# Patient Record
Sex: Female | Born: 1940 | Race: Black or African American | Hispanic: No | Smoking: Former smoker
Health system: Southern US, Community
[De-identification: ages and names within clinical notes are randomized; demographics above are authoritative.]

## PROBLEM LIST (undated history)

## (undated) DIAGNOSIS — M8580 Other specified disorders of bone density and structure, unspecified site: Secondary | ICD-10-CM

## (undated) DIAGNOSIS — I10 Essential (primary) hypertension: Secondary | ICD-10-CM

## (undated) DIAGNOSIS — J45909 Unspecified asthma, uncomplicated: Secondary | ICD-10-CM

## (undated) DIAGNOSIS — I2510 Atherosclerotic heart disease of native coronary artery without angina pectoris: Secondary | ICD-10-CM

## (undated) DIAGNOSIS — F319 Bipolar disorder, unspecified: Secondary | ICD-10-CM

## (undated) DIAGNOSIS — J310 Chronic rhinitis: Secondary | ICD-10-CM

## (undated) DIAGNOSIS — K635 Polyp of colon: Secondary | ICD-10-CM

## (undated) DIAGNOSIS — K449 Diaphragmatic hernia without obstruction or gangrene: Secondary | ICD-10-CM

## (undated) DIAGNOSIS — J449 Chronic obstructive pulmonary disease, unspecified: Secondary | ICD-10-CM

## (undated) DIAGNOSIS — K219 Gastro-esophageal reflux disease without esophagitis: Secondary | ICD-10-CM

## (undated) DIAGNOSIS — J4530 Mild persistent asthma, uncomplicated: Secondary | ICD-10-CM

## (undated) DIAGNOSIS — E785 Hyperlipidemia, unspecified: Secondary | ICD-10-CM

## (undated) DIAGNOSIS — B9681 Helicobacter pylori [H. pylori] as the cause of diseases classified elsewhere: Secondary | ICD-10-CM

## (undated) DIAGNOSIS — K3184 Gastroparesis: Secondary | ICD-10-CM

## (undated) DIAGNOSIS — M545 Low back pain, unspecified: Secondary | ICD-10-CM

## (undated) DIAGNOSIS — J329 Chronic sinusitis, unspecified: Secondary | ICD-10-CM

## (undated) DIAGNOSIS — K2970 Gastritis, unspecified, without bleeding: Secondary | ICD-10-CM

## (undated) DIAGNOSIS — F419 Anxiety disorder, unspecified: Secondary | ICD-10-CM

## (undated) DIAGNOSIS — G4730 Sleep apnea, unspecified: Secondary | ICD-10-CM

## (undated) HISTORY — DX: Chronic sinusitis, unspecified: J32.9

## (undated) HISTORY — DX: Other specified disorders of bone density and structure, unspecified site: M85.80

## (undated) HISTORY — DX: Atherosclerotic heart disease of native coronary artery without angina pectoris: I25.10

## (undated) HISTORY — DX: Chronic rhinitis: J31.0

## (undated) HISTORY — DX: Hyperlipidemia, unspecified: E78.5

## (undated) HISTORY — DX: Polyp of colon: K63.5

## (undated) HISTORY — DX: Bipolar disorder, unspecified: F31.9

## (undated) HISTORY — DX: Low back pain, unspecified: M54.50

## (undated) HISTORY — DX: Diaphragmatic hernia without obstruction or gangrene: K44.9

## (undated) HISTORY — PX: POLYPECTOMY: SHX149

## (undated) HISTORY — DX: Gastritis, unspecified, without bleeding: K29.70

## (undated) HISTORY — DX: Helicobacter pylori (H. pylori) as the cause of diseases classified elsewhere: B96.81

## (undated) HISTORY — DX: Mild persistent asthma, uncomplicated: J45.30

## (undated) HISTORY — DX: Sleep apnea, unspecified: G47.30

## (undated) HISTORY — PX: COLONOSCOPY: SHX174

## (undated) HISTORY — DX: Gastro-esophageal reflux disease without esophagitis: K21.9

## (undated) HISTORY — DX: Gastroparesis: K31.84

---
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
## 1966-11-11 HISTORY — PX: TUBAL LIGATION: SHX77

## 1967-11-12 HISTORY — PX: OTHER SURGICAL HISTORY: SHX169

## 1998-03-06 ENCOUNTER — Ambulatory Visit (HOSPITAL_COMMUNITY): Admission: RE | Admit: 1998-03-06 | Discharge: 1998-03-06 | Payer: Self-pay

## 1998-05-30 ENCOUNTER — Other Ambulatory Visit: Admission: RE | Admit: 1998-05-30 | Discharge: 1998-05-30 | Payer: Self-pay | Admitting: Family Medicine

## 1998-09-17 ENCOUNTER — Encounter: Payer: Self-pay | Admitting: Emergency Medicine

## 1998-09-17 ENCOUNTER — Emergency Department (HOSPITAL_COMMUNITY): Admission: EM | Admit: 1998-09-17 | Discharge: 1998-09-17 | Payer: Self-pay | Admitting: Emergency Medicine

## 1998-10-09 ENCOUNTER — Encounter: Payer: Self-pay | Admitting: Emergency Medicine

## 1998-10-09 ENCOUNTER — Emergency Department (HOSPITAL_COMMUNITY): Admission: EM | Admit: 1998-10-09 | Discharge: 1998-10-09 | Payer: Self-pay | Admitting: Emergency Medicine

## 1998-10-22 ENCOUNTER — Emergency Department (HOSPITAL_COMMUNITY): Admission: EM | Admit: 1998-10-22 | Discharge: 1998-10-22 | Payer: Self-pay | Admitting: Emergency Medicine

## 1998-10-22 ENCOUNTER — Encounter: Payer: Self-pay | Admitting: Emergency Medicine

## 1999-01-24 ENCOUNTER — Encounter: Payer: Self-pay | Admitting: *Deleted

## 1999-01-24 ENCOUNTER — Emergency Department (HOSPITAL_COMMUNITY): Admission: EM | Admit: 1999-01-24 | Discharge: 1999-01-24 | Payer: Self-pay | Admitting: *Deleted

## 1999-08-08 ENCOUNTER — Encounter: Payer: Self-pay | Admitting: Internal Medicine

## 1999-08-08 ENCOUNTER — Ambulatory Visit (HOSPITAL_COMMUNITY): Admission: RE | Admit: 1999-08-08 | Discharge: 1999-08-08 | Payer: Self-pay | Admitting: Internal Medicine

## 1999-09-11 ENCOUNTER — Encounter: Admission: RE | Admit: 1999-09-11 | Discharge: 1999-11-20 | Payer: Self-pay | Admitting: Internal Medicine

## 2000-01-05 ENCOUNTER — Encounter: Payer: Self-pay | Admitting: Emergency Medicine

## 2000-01-05 ENCOUNTER — Encounter: Payer: Self-pay | Admitting: Internal Medicine

## 2000-01-05 ENCOUNTER — Inpatient Hospital Stay (HOSPITAL_COMMUNITY): Admission: EM | Admit: 2000-01-05 | Discharge: 2000-01-08 | Payer: Self-pay | Admitting: Emergency Medicine

## 2000-01-06 ENCOUNTER — Encounter: Payer: Self-pay | Admitting: Internal Medicine

## 2000-01-07 ENCOUNTER — Encounter: Payer: Self-pay | Admitting: Neurology

## 2000-01-08 ENCOUNTER — Encounter: Payer: Self-pay | Admitting: Internal Medicine

## 2000-04-10 ENCOUNTER — Ambulatory Visit (HOSPITAL_COMMUNITY): Admission: RE | Admit: 2000-04-10 | Discharge: 2000-04-10 | Payer: Self-pay | Admitting: Internal Medicine

## 2000-04-10 ENCOUNTER — Encounter: Payer: Self-pay | Admitting: Internal Medicine

## 2000-05-17 ENCOUNTER — Encounter: Payer: Self-pay | Admitting: Emergency Medicine

## 2000-05-17 ENCOUNTER — Emergency Department (HOSPITAL_COMMUNITY): Admission: EM | Admit: 2000-05-17 | Discharge: 2000-05-17 | Payer: Self-pay | Admitting: Emergency Medicine

## 2000-07-17 ENCOUNTER — Ambulatory Visit (HOSPITAL_COMMUNITY): Admission: RE | Admit: 2000-07-17 | Discharge: 2000-07-17 | Payer: Self-pay | Admitting: Critical Care Medicine

## 2000-07-17 ENCOUNTER — Encounter: Payer: Self-pay | Admitting: Critical Care Medicine

## 2000-08-21 ENCOUNTER — Encounter: Payer: Self-pay | Admitting: Pulmonary Disease

## 2000-08-21 ENCOUNTER — Ambulatory Visit (HOSPITAL_COMMUNITY): Admission: RE | Admit: 2000-08-21 | Discharge: 2000-08-21 | Payer: Self-pay | Admitting: Pulmonary Disease

## 2000-08-25 ENCOUNTER — Encounter: Admission: RE | Admit: 2000-08-25 | Discharge: 2000-11-23 | Payer: Self-pay

## 2000-09-26 ENCOUNTER — Encounter: Admission: RE | Admit: 2000-09-26 | Discharge: 2000-11-18 | Payer: Self-pay | Admitting: Internal Medicine

## 2001-02-04 ENCOUNTER — Encounter: Admission: RE | Admit: 2001-02-04 | Discharge: 2001-05-05 | Payer: Self-pay | Admitting: Internal Medicine

## 2001-03-04 ENCOUNTER — Inpatient Hospital Stay (HOSPITAL_COMMUNITY): Admission: EM | Admit: 2001-03-04 | Discharge: 2001-03-07 | Payer: Self-pay | Admitting: Internal Medicine

## 2001-04-07 ENCOUNTER — Encounter: Payer: Self-pay | Admitting: Gastroenterology

## 2001-04-07 ENCOUNTER — Encounter (INDEPENDENT_AMBULATORY_CARE_PROVIDER_SITE_OTHER): Payer: Self-pay

## 2001-04-07 ENCOUNTER — Other Ambulatory Visit: Admission: RE | Admit: 2001-04-07 | Discharge: 2001-04-07 | Payer: Self-pay | Admitting: Gastroenterology

## 2001-06-03 ENCOUNTER — Encounter: Admission: RE | Admit: 2001-06-03 | Discharge: 2001-09-01 | Payer: Self-pay | Admitting: Internal Medicine

## 2001-11-02 ENCOUNTER — Inpatient Hospital Stay (HOSPITAL_COMMUNITY): Admission: EM | Admit: 2001-11-02 | Discharge: 2001-11-06 | Payer: Self-pay | Admitting: Emergency Medicine

## 2001-11-02 ENCOUNTER — Encounter: Payer: Self-pay | Admitting: Emergency Medicine

## 2002-02-19 ENCOUNTER — Encounter: Admission: RE | Admit: 2002-02-19 | Discharge: 2002-02-19 | Payer: Self-pay | Admitting: Internal Medicine

## 2002-02-19 ENCOUNTER — Encounter: Payer: Self-pay | Admitting: Internal Medicine

## 2003-02-10 ENCOUNTER — Encounter: Payer: Self-pay | Admitting: Gastroenterology

## 2003-02-10 ENCOUNTER — Encounter: Payer: Self-pay | Admitting: Internal Medicine

## 2003-03-01 ENCOUNTER — Ambulatory Visit (HOSPITAL_COMMUNITY): Admission: RE | Admit: 2003-03-01 | Discharge: 2003-03-01 | Payer: Self-pay | Admitting: Pulmonary Disease

## 2003-03-01 ENCOUNTER — Inpatient Hospital Stay (HOSPITAL_COMMUNITY): Admission: RE | Admit: 2003-03-01 | Discharge: 2003-03-04 | Payer: Self-pay | Admitting: Internal Medicine

## 2003-03-01 ENCOUNTER — Encounter: Payer: Self-pay | Admitting: Pulmonary Disease

## 2003-03-11 ENCOUNTER — Encounter: Payer: Self-pay | Admitting: Internal Medicine

## 2003-03-11 ENCOUNTER — Encounter: Admission: RE | Admit: 2003-03-11 | Discharge: 2003-03-11 | Payer: Self-pay | Admitting: Internal Medicine

## 2003-05-03 ENCOUNTER — Encounter: Admission: RE | Admit: 2003-05-03 | Discharge: 2003-05-03 | Payer: Self-pay | Admitting: Internal Medicine

## 2003-05-03 ENCOUNTER — Encounter: Payer: Self-pay | Admitting: Internal Medicine

## 2004-03-12 ENCOUNTER — Encounter: Admission: RE | Admit: 2004-03-12 | Discharge: 2004-03-12 | Payer: Self-pay | Admitting: Internal Medicine

## 2004-08-08 ENCOUNTER — Ambulatory Visit (HOSPITAL_COMMUNITY): Admission: RE | Admit: 2004-08-08 | Discharge: 2004-08-08 | Payer: Self-pay | Admitting: Pulmonary Disease

## 2004-10-03 ENCOUNTER — Ambulatory Visit: Payer: Self-pay | Admitting: Pulmonary Disease

## 2004-10-18 ENCOUNTER — Ambulatory Visit: Payer: Self-pay | Admitting: Pulmonary Disease

## 2004-12-05 ENCOUNTER — Ambulatory Visit: Payer: Self-pay | Admitting: Pulmonary Disease

## 2004-12-05 ENCOUNTER — Ambulatory Visit (HOSPITAL_BASED_OUTPATIENT_CLINIC_OR_DEPARTMENT_OTHER): Admission: RE | Admit: 2004-12-05 | Discharge: 2004-12-05 | Payer: Self-pay | Admitting: Pulmonary Disease

## 2004-12-24 ENCOUNTER — Ambulatory Visit: Payer: Self-pay | Admitting: Pulmonary Disease

## 2005-01-07 ENCOUNTER — Ambulatory Visit: Payer: Self-pay | Admitting: Internal Medicine

## 2005-01-07 ENCOUNTER — Ambulatory Visit: Payer: Self-pay | Admitting: Pulmonary Disease

## 2005-01-29 ENCOUNTER — Ambulatory Visit: Payer: Self-pay | Admitting: Internal Medicine

## 2005-02-05 ENCOUNTER — Ambulatory Visit: Payer: Self-pay | Admitting: Pulmonary Disease

## 2005-02-19 ENCOUNTER — Ambulatory Visit: Payer: Self-pay | Admitting: Critical Care Medicine

## 2005-02-28 ENCOUNTER — Ambulatory Visit: Payer: Self-pay | Admitting: Gastroenterology

## 2005-03-01 ENCOUNTER — Ambulatory Visit: Payer: Self-pay | Admitting: Pulmonary Disease

## 2005-03-05 ENCOUNTER — Ambulatory Visit: Payer: Self-pay | Admitting: Internal Medicine

## 2005-03-13 ENCOUNTER — Encounter: Admission: RE | Admit: 2005-03-13 | Discharge: 2005-03-13 | Payer: Self-pay | Admitting: Internal Medicine

## 2005-03-22 ENCOUNTER — Ambulatory Visit: Payer: Self-pay | Admitting: Internal Medicine

## 2005-04-01 ENCOUNTER — Ambulatory Visit: Payer: Self-pay | Admitting: Pulmonary Disease

## 2005-04-11 ENCOUNTER — Ambulatory Visit: Payer: Self-pay | Admitting: Gastroenterology

## 2005-05-03 ENCOUNTER — Ambulatory Visit: Payer: Self-pay | Admitting: Internal Medicine

## 2005-06-04 ENCOUNTER — Ambulatory Visit: Payer: Self-pay | Admitting: Pulmonary Disease

## 2005-06-12 ENCOUNTER — Ambulatory Visit: Payer: Self-pay | Admitting: Pulmonary Disease

## 2005-07-03 ENCOUNTER — Ambulatory Visit: Payer: Self-pay | Admitting: Pulmonary Disease

## 2005-08-02 ENCOUNTER — Ambulatory Visit: Payer: Self-pay | Admitting: Internal Medicine

## 2005-08-12 ENCOUNTER — Ambulatory Visit: Payer: Self-pay | Admitting: Internal Medicine

## 2005-08-22 ENCOUNTER — Ambulatory Visit: Payer: Self-pay | Admitting: Pulmonary Disease

## 2005-09-03 ENCOUNTER — Ambulatory Visit: Payer: Self-pay | Admitting: Pulmonary Disease

## 2005-10-16 ENCOUNTER — Ambulatory Visit: Payer: Self-pay | Admitting: Pulmonary Disease

## 2005-10-23 ENCOUNTER — Ambulatory Visit: Payer: Self-pay | Admitting: Internal Medicine

## 2005-11-18 ENCOUNTER — Ambulatory Visit: Payer: Self-pay | Admitting: Internal Medicine

## 2005-11-18 ENCOUNTER — Ambulatory Visit: Payer: Self-pay | Admitting: Critical Care Medicine

## 2005-11-28 ENCOUNTER — Encounter: Admission: RE | Admit: 2005-11-28 | Discharge: 2005-11-28 | Payer: Self-pay | Admitting: Orthopedic Surgery

## 2005-12-10 ENCOUNTER — Ambulatory Visit: Payer: Self-pay | Admitting: Pulmonary Disease

## 2005-12-16 ENCOUNTER — Ambulatory Visit: Payer: Self-pay | Admitting: Pulmonary Disease

## 2005-12-23 ENCOUNTER — Ambulatory Visit: Payer: Self-pay | Admitting: Gastroenterology

## 2005-12-30 ENCOUNTER — Ambulatory Visit: Payer: Self-pay | Admitting: Internal Medicine

## 2006-01-14 ENCOUNTER — Ambulatory Visit: Payer: Self-pay | Admitting: Critical Care Medicine

## 2006-02-11 ENCOUNTER — Ambulatory Visit: Payer: Self-pay | Admitting: Pulmonary Disease

## 2006-03-14 ENCOUNTER — Ambulatory Visit: Payer: Self-pay

## 2006-03-25 ENCOUNTER — Encounter: Admission: RE | Admit: 2006-03-25 | Discharge: 2006-03-25 | Payer: Self-pay | Admitting: Internal Medicine

## 2006-04-17 ENCOUNTER — Ambulatory Visit: Payer: Self-pay | Admitting: Pulmonary Disease

## 2006-04-28 ENCOUNTER — Ambulatory Visit: Payer: Self-pay | Admitting: Internal Medicine

## 2006-05-16 ENCOUNTER — Ambulatory Visit: Payer: Self-pay | Admitting: Internal Medicine

## 2006-05-19 ENCOUNTER — Ambulatory Visit: Payer: Self-pay | Admitting: Internal Medicine

## 2006-05-20 ENCOUNTER — Ambulatory Visit: Payer: Self-pay | Admitting: Pulmonary Disease

## 2006-06-24 ENCOUNTER — Ambulatory Visit: Payer: Self-pay | Admitting: Pulmonary Disease

## 2006-07-11 ENCOUNTER — Ambulatory Visit: Payer: Self-pay | Admitting: Pulmonary Disease

## 2006-07-11 LAB — PULMONARY FUNCTION TEST

## 2006-07-23 ENCOUNTER — Ambulatory Visit: Payer: Self-pay | Admitting: Pulmonary Disease

## 2006-07-24 ENCOUNTER — Ambulatory Visit: Payer: Self-pay | Admitting: Internal Medicine

## 2006-08-05 ENCOUNTER — Ambulatory Visit: Payer: Self-pay | Admitting: Pulmonary Disease

## 2006-08-21 ENCOUNTER — Ambulatory Visit: Payer: Self-pay | Admitting: Pulmonary Disease

## 2006-09-08 ENCOUNTER — Ambulatory Visit: Payer: Self-pay | Admitting: Pulmonary Disease

## 2006-09-16 ENCOUNTER — Ambulatory Visit: Payer: Self-pay | Admitting: Gastroenterology

## 2006-09-29 ENCOUNTER — Ambulatory Visit: Payer: Self-pay | Admitting: Internal Medicine

## 2006-10-31 ENCOUNTER — Ambulatory Visit: Payer: Self-pay | Admitting: Pulmonary Disease

## 2006-11-28 ENCOUNTER — Ambulatory Visit: Payer: Self-pay | Admitting: Pulmonary Disease

## 2006-12-04 ENCOUNTER — Encounter (HOSPITAL_COMMUNITY): Admission: RE | Admit: 2006-12-04 | Discharge: 2007-03-04 | Payer: Self-pay | Admitting: Surgical Oncology

## 2006-12-19 ENCOUNTER — Ambulatory Visit: Payer: Self-pay | Admitting: Gastroenterology

## 2006-12-22 ENCOUNTER — Ambulatory Visit: Payer: Self-pay | Admitting: Internal Medicine

## 2006-12-26 ENCOUNTER — Ambulatory Visit: Payer: Self-pay | Admitting: Internal Medicine

## 2006-12-30 ENCOUNTER — Ambulatory Visit: Payer: Self-pay | Admitting: Internal Medicine

## 2007-01-13 ENCOUNTER — Ambulatory Visit: Payer: Self-pay | Admitting: Pulmonary Disease

## 2007-02-02 ENCOUNTER — Ambulatory Visit: Payer: Self-pay | Admitting: Internal Medicine

## 2007-02-11 ENCOUNTER — Ambulatory Visit: Payer: Self-pay | Admitting: Internal Medicine

## 2007-02-27 ENCOUNTER — Ambulatory Visit: Payer: Self-pay | Admitting: Internal Medicine

## 2007-03-10 ENCOUNTER — Ambulatory Visit: Payer: Self-pay | Admitting: Pulmonary Disease

## 2007-03-12 ENCOUNTER — Encounter (HOSPITAL_COMMUNITY): Admission: RE | Admit: 2007-03-12 | Discharge: 2007-06-10 | Payer: Self-pay | Admitting: Surgical Oncology

## 2007-03-25 ENCOUNTER — Encounter: Payer: Self-pay | Admitting: Internal Medicine

## 2007-04-01 ENCOUNTER — Ambulatory Visit: Payer: Self-pay | Admitting: Internal Medicine

## 2007-04-05 ENCOUNTER — Encounter: Admission: RE | Admit: 2007-04-05 | Discharge: 2007-04-05 | Payer: Self-pay | Admitting: Orthopedic Surgery

## 2007-05-01 ENCOUNTER — Ambulatory Visit: Payer: Self-pay | Admitting: Internal Medicine

## 2007-05-11 ENCOUNTER — Encounter: Admission: RE | Admit: 2007-05-11 | Discharge: 2007-05-11 | Payer: Self-pay | Admitting: Orthopedic Surgery

## 2007-05-20 ENCOUNTER — Encounter: Admission: RE | Admit: 2007-05-20 | Discharge: 2007-05-20 | Payer: Self-pay | Admitting: Internal Medicine

## 2007-05-30 ENCOUNTER — Encounter: Payer: Self-pay | Admitting: Internal Medicine

## 2007-05-30 DIAGNOSIS — M810 Age-related osteoporosis without current pathological fracture: Secondary | ICD-10-CM | POA: Insufficient documentation

## 2007-05-30 DIAGNOSIS — K219 Gastro-esophageal reflux disease without esophagitis: Secondary | ICD-10-CM | POA: Insufficient documentation

## 2007-05-30 HISTORY — DX: Age-related osteoporosis without current pathological fracture: M81.0

## 2007-06-16 ENCOUNTER — Ambulatory Visit: Payer: Self-pay | Admitting: Internal Medicine

## 2007-06-24 ENCOUNTER — Ambulatory Visit: Payer: Self-pay | Admitting: Internal Medicine

## 2007-07-09 ENCOUNTER — Ambulatory Visit: Payer: Self-pay | Admitting: Internal Medicine

## 2007-08-13 ENCOUNTER — Encounter: Payer: Self-pay | Admitting: Internal Medicine

## 2007-08-13 ENCOUNTER — Ambulatory Visit: Payer: Self-pay | Admitting: Internal Medicine

## 2007-10-26 ENCOUNTER — Ambulatory Visit: Payer: Self-pay | Admitting: Internal Medicine

## 2007-10-26 LAB — CONVERTED CEMR LAB
ALT: 21 units/L (ref 0–35)
AST: 24 units/L (ref 0–37)
Alkaline Phosphatase: 104 units/L (ref 39–117)
BUN: 16 mg/dL (ref 6–23)
Basophils Absolute: 0 10*3/uL (ref 0.0–0.1)
Basophils Relative: 0.3 % (ref 0.0–1.0)
Bilirubin Urine: NEGATIVE
CO2: 29 meq/L (ref 19–32)
Creatinine, Ser: 1.1 mg/dL (ref 0.4–1.2)
Crystals: NEGATIVE
Eosinophils Absolute: 0.9 10*3/uL — ABNORMAL HIGH (ref 0.0–0.6)
Eosinophils Relative: 12.6 % — ABNORMAL HIGH (ref 0.0–5.0)
GFR calc Af Amer: 64 mL/min
HCT: 36.9 % (ref 36.0–46.0)
HDL: 33.9 mg/dL — ABNORMAL LOW (ref >39.0)
Monocytes Relative: 10.1 % (ref 3.0–11.0)
Mucus, UA: NEGATIVE
Neutro Abs: 3.1 10*3/uL (ref 1.4–7.7)
Neutrophils Relative %: 41 % — ABNORMAL LOW (ref 43.0–77.0)
Nitrite: POSITIVE — AB
Platelets: 252 10*3/uL (ref 150–400)
Potassium: 4.3 meq/L (ref 3.5–5.1)
TSH: 0.52 microintl units/mL (ref 0.35–5.50)
Total Bilirubin: 0.6 mg/dL (ref 0.3–1.2)
Total Protein, Urine: NEGATIVE mg/dL
Urine Glucose: NEGATIVE mg/dL
Urobilinogen, UA: 0.2 (ref 0.0–1.0)
pH: 6 (ref 5.0–8.0)

## 2007-10-27 ENCOUNTER — Encounter: Payer: Self-pay | Admitting: Internal Medicine

## 2007-11-03 ENCOUNTER — Encounter: Payer: Self-pay | Admitting: Internal Medicine

## 2008-01-08 ENCOUNTER — Ambulatory Visit: Payer: Self-pay | Admitting: Internal Medicine

## 2008-01-08 DIAGNOSIS — E785 Hyperlipidemia, unspecified: Secondary | ICD-10-CM | POA: Insufficient documentation

## 2008-01-15 ENCOUNTER — Telehealth (INDEPENDENT_AMBULATORY_CARE_PROVIDER_SITE_OTHER): Payer: Self-pay | Admitting: *Deleted

## 2008-02-08 ENCOUNTER — Telehealth (INDEPENDENT_AMBULATORY_CARE_PROVIDER_SITE_OTHER): Payer: Self-pay | Admitting: *Deleted

## 2008-02-22 ENCOUNTER — Ambulatory Visit: Payer: Self-pay | Admitting: Internal Medicine

## 2008-02-22 DIAGNOSIS — J309 Allergic rhinitis, unspecified: Secondary | ICD-10-CM

## 2008-02-22 HISTORY — DX: Allergic rhinitis, unspecified: J30.9

## 2008-02-24 ENCOUNTER — Telehealth: Payer: Self-pay | Admitting: Internal Medicine

## 2008-03-06 DIAGNOSIS — G4733 Obstructive sleep apnea (adult) (pediatric): Secondary | ICD-10-CM

## 2008-03-06 HISTORY — DX: Obstructive sleep apnea (adult) (pediatric): G47.33

## 2008-04-05 ENCOUNTER — Ambulatory Visit: Payer: Self-pay | Admitting: Internal Medicine

## 2008-04-13 ENCOUNTER — Encounter: Payer: Self-pay | Admitting: Gastroenterology

## 2008-04-13 DIAGNOSIS — Z860100 Personal history of colon polyps, unspecified: Secondary | ICD-10-CM

## 2008-04-13 HISTORY — DX: Personal history of colon polyps, unspecified: Z86.0100

## 2008-04-14 ENCOUNTER — Ambulatory Visit: Payer: Self-pay | Admitting: Gastroenterology

## 2008-05-05 ENCOUNTER — Ambulatory Visit: Payer: Self-pay | Admitting: Internal Medicine

## 2008-06-10 ENCOUNTER — Ambulatory Visit: Payer: Self-pay | Admitting: Internal Medicine

## 2008-06-10 LAB — CONVERTED CEMR LAB
Basophils Relative: 2.7 % (ref 0.0–3.0)
Eosinophils Absolute: 0.8 10*3/uL — ABNORMAL HIGH (ref 0.0–0.7)
HCT: 35.9 % — ABNORMAL LOW (ref 36.0–46.0)
MCHC: 33.2 g/dL (ref 30.0–36.0)
MCV: 86.3 fL (ref 78.0–100.0)
Monocytes Relative: 5.9 % (ref 3.0–12.0)
Neutrophils Relative %: 40.4 % — ABNORMAL LOW (ref 43.0–77.0)
Platelets: 245 10*3/uL (ref 150–400)
RDW: 12.8 % (ref 11.5–14.6)

## 2008-06-14 ENCOUNTER — Telehealth: Payer: Self-pay | Admitting: Internal Medicine

## 2008-06-29 ENCOUNTER — Encounter: Admission: RE | Admit: 2008-06-29 | Discharge: 2008-06-29 | Payer: Self-pay | Admitting: Internal Medicine

## 2008-07-12 ENCOUNTER — Ambulatory Visit: Payer: Self-pay | Admitting: Internal Medicine

## 2008-07-18 ENCOUNTER — Encounter: Payer: Self-pay | Admitting: Internal Medicine

## 2008-07-22 ENCOUNTER — Ambulatory Visit: Payer: Self-pay | Admitting: Internal Medicine

## 2008-07-26 ENCOUNTER — Ambulatory Visit: Payer: Self-pay | Admitting: Internal Medicine

## 2008-08-04 ENCOUNTER — Telehealth: Payer: Self-pay | Admitting: Internal Medicine

## 2008-08-08 ENCOUNTER — Telehealth: Payer: Self-pay | Admitting: Internal Medicine

## 2008-08-11 ENCOUNTER — Ambulatory Visit: Payer: Self-pay | Admitting: Internal Medicine

## 2008-08-11 DIAGNOSIS — J328 Other chronic sinusitis: Secondary | ICD-10-CM

## 2008-08-12 ENCOUNTER — Telehealth: Payer: Self-pay | Admitting: Internal Medicine

## 2008-08-16 ENCOUNTER — Ambulatory Visit: Payer: Self-pay | Admitting: Internal Medicine

## 2008-08-23 ENCOUNTER — Ambulatory Visit: Payer: Self-pay | Admitting: Internal Medicine

## 2008-08-26 ENCOUNTER — Telehealth: Payer: Self-pay | Admitting: Internal Medicine

## 2008-08-30 ENCOUNTER — Ambulatory Visit: Payer: Self-pay | Admitting: Internal Medicine

## 2008-08-30 DIAGNOSIS — R071 Chest pain on breathing: Secondary | ICD-10-CM | POA: Insufficient documentation

## 2008-09-12 ENCOUNTER — Ambulatory Visit: Payer: Self-pay | Admitting: Internal Medicine

## 2008-09-12 ENCOUNTER — Encounter: Payer: Self-pay | Admitting: Internal Medicine

## 2008-09-16 ENCOUNTER — Telehealth (INDEPENDENT_AMBULATORY_CARE_PROVIDER_SITE_OTHER): Payer: Self-pay | Admitting: *Deleted

## 2008-09-20 ENCOUNTER — Ambulatory Visit: Payer: Self-pay | Admitting: Internal Medicine

## 2008-10-07 ENCOUNTER — Encounter: Payer: Self-pay | Admitting: Internal Medicine

## 2008-10-22 ENCOUNTER — Encounter: Payer: Self-pay | Admitting: Internal Medicine

## 2008-10-25 ENCOUNTER — Encounter: Payer: Self-pay | Admitting: Internal Medicine

## 2008-10-27 ENCOUNTER — Ambulatory Visit: Payer: Self-pay | Admitting: Internal Medicine

## 2008-11-07 ENCOUNTER — Telehealth (INDEPENDENT_AMBULATORY_CARE_PROVIDER_SITE_OTHER): Payer: Self-pay | Admitting: *Deleted

## 2008-11-07 ENCOUNTER — Telehealth: Payer: Self-pay | Admitting: Internal Medicine

## 2008-11-11 DIAGNOSIS — K3184 Gastroparesis: Secondary | ICD-10-CM

## 2008-11-11 HISTORY — DX: Gastroparesis: K31.84

## 2008-11-16 ENCOUNTER — Telehealth (INDEPENDENT_AMBULATORY_CARE_PROVIDER_SITE_OTHER): Payer: Self-pay | Admitting: *Deleted

## 2008-11-17 ENCOUNTER — Telehealth: Payer: Self-pay | Admitting: Internal Medicine

## 2008-11-23 ENCOUNTER — Telehealth: Payer: Self-pay | Admitting: Internal Medicine

## 2008-11-25 ENCOUNTER — Encounter: Payer: Self-pay | Admitting: Internal Medicine

## 2008-12-05 ENCOUNTER — Encounter: Payer: Self-pay | Admitting: Internal Medicine

## 2008-12-11 ENCOUNTER — Telehealth: Payer: Self-pay | Admitting: Internal Medicine

## 2008-12-11 ENCOUNTER — Encounter: Payer: Self-pay | Admitting: Internal Medicine

## 2008-12-14 ENCOUNTER — Telehealth: Payer: Self-pay | Admitting: Gastroenterology

## 2008-12-22 ENCOUNTER — Encounter: Payer: Self-pay | Admitting: Internal Medicine

## 2008-12-26 ENCOUNTER — Encounter: Payer: Self-pay | Admitting: Internal Medicine

## 2008-12-28 ENCOUNTER — Encounter: Payer: Self-pay | Admitting: Internal Medicine

## 2008-12-29 ENCOUNTER — Ambulatory Visit: Payer: Self-pay | Admitting: Internal Medicine

## 2008-12-29 ENCOUNTER — Telehealth: Payer: Self-pay | Admitting: Internal Medicine

## 2009-01-02 ENCOUNTER — Ambulatory Visit: Payer: Self-pay | Admitting: Gastroenterology

## 2009-01-04 ENCOUNTER — Telehealth: Payer: Self-pay | Admitting: Gastroenterology

## 2009-01-05 ENCOUNTER — Encounter: Payer: Self-pay | Admitting: Internal Medicine

## 2009-01-07 ENCOUNTER — Encounter: Payer: Self-pay | Admitting: Internal Medicine

## 2009-01-12 ENCOUNTER — Encounter: Payer: Self-pay | Admitting: Internal Medicine

## 2009-01-13 ENCOUNTER — Encounter: Payer: Self-pay | Admitting: Internal Medicine

## 2009-01-13 ENCOUNTER — Ambulatory Visit (HOSPITAL_COMMUNITY): Admission: RE | Admit: 2009-01-13 | Discharge: 2009-01-13 | Payer: Self-pay | Admitting: Gastroenterology

## 2009-02-02 ENCOUNTER — Telehealth (INDEPENDENT_AMBULATORY_CARE_PROVIDER_SITE_OTHER): Payer: Self-pay | Admitting: *Deleted

## 2009-02-09 DIAGNOSIS — B9681 Helicobacter pylori [H. pylori] as the cause of diseases classified elsewhere: Secondary | ICD-10-CM

## 2009-02-09 HISTORY — DX: Helicobacter pylori (H. pylori) as the cause of diseases classified elsewhere: B96.81

## 2009-02-16 ENCOUNTER — Ambulatory Visit: Payer: Self-pay | Admitting: Internal Medicine

## 2009-02-24 ENCOUNTER — Telehealth: Payer: Self-pay | Admitting: Internal Medicine

## 2009-03-03 ENCOUNTER — Encounter: Payer: Self-pay | Admitting: Internal Medicine

## 2009-03-07 ENCOUNTER — Encounter: Payer: Self-pay | Admitting: Gastroenterology

## 2009-03-07 ENCOUNTER — Ambulatory Visit: Payer: Self-pay | Admitting: Gastroenterology

## 2009-03-10 ENCOUNTER — Encounter: Payer: Self-pay | Admitting: Gastroenterology

## 2009-03-21 ENCOUNTER — Telehealth: Payer: Self-pay | Admitting: Gastroenterology

## 2009-03-27 ENCOUNTER — Ambulatory Visit: Payer: Self-pay | Admitting: Internal Medicine

## 2009-04-19 ENCOUNTER — Telehealth (INDEPENDENT_AMBULATORY_CARE_PROVIDER_SITE_OTHER): Payer: Self-pay

## 2009-04-20 ENCOUNTER — Ambulatory Visit: Payer: Self-pay | Admitting: Internal Medicine

## 2009-04-26 ENCOUNTER — Encounter: Payer: Self-pay | Admitting: Internal Medicine

## 2009-04-27 LAB — CONVERTED CEMR LAB
BUN: 20 mg/dL (ref 6–23)
Basophils Relative: 1.4 % (ref 0.0–3.0)
CO2: 28 meq/L (ref 19–32)
Calcium: 9 mg/dL (ref 8.4–10.5)
Creatinine, Ser: 1.4 mg/dL — ABNORMAL HIGH (ref 0.4–1.2)
Eosinophils Absolute: 1.2 10*3/uL — ABNORMAL HIGH (ref 0.0–0.7)
Eosinophils Relative: 14.6 % — ABNORMAL HIGH (ref 0.0–5.0)
GFR calc non Af Amer: 48.37 mL/min (ref >60)
HCT: 37.1 % (ref 36.0–46.0)
Lymphocytes Relative: 31 % (ref 12.0–46.0)
Lymphs Abs: 2.5 10*3/uL (ref 0.7–4.0)
MCHC: 33.5 g/dL (ref 30.0–36.0)
Monocytes Absolute: 0.7 10*3/uL (ref 0.1–1.0)
Monocytes Relative: 8.8 % (ref 3.0–12.0)
Neutro Abs: 3.6 10*3/uL (ref 1.4–7.7)
Neutrophils Relative %: 44.2 % (ref 43.0–77.0)
Platelets: 223 10*3/uL (ref 150.0–400.0)
Total CHOL/HDL Ratio: 7
VLDL: 30.8 mg/dL (ref 0.0–40.0)
WBC: 8.1 10*3/uL (ref 4.5–10.5)

## 2009-05-09 ENCOUNTER — Encounter: Admission: RE | Admit: 2009-05-09 | Discharge: 2009-05-09 | Payer: Self-pay | Admitting: Internal Medicine

## 2009-05-09 ENCOUNTER — Encounter: Payer: Self-pay | Admitting: Internal Medicine

## 2009-05-29 ENCOUNTER — Ambulatory Visit: Payer: Self-pay | Admitting: Gastroenterology

## 2009-05-29 DIAGNOSIS — K2990 Gastroduodenitis, unspecified, without bleeding: Secondary | ICD-10-CM

## 2009-05-29 DIAGNOSIS — R197 Diarrhea, unspecified: Secondary | ICD-10-CM

## 2009-05-29 DIAGNOSIS — K2970 Gastritis, unspecified, without bleeding: Secondary | ICD-10-CM | POA: Insufficient documentation

## 2009-06-20 ENCOUNTER — Ambulatory Visit: Payer: Self-pay | Admitting: Internal Medicine

## 2009-06-26 ENCOUNTER — Ambulatory Visit: Payer: Self-pay

## 2009-06-27 ENCOUNTER — Ambulatory Visit: Payer: Self-pay | Admitting: Internal Medicine

## 2009-06-30 ENCOUNTER — Encounter: Admission: RE | Admit: 2009-06-30 | Discharge: 2009-06-30 | Payer: Self-pay | Admitting: Internal Medicine

## 2009-07-12 ENCOUNTER — Ambulatory Visit: Payer: Self-pay | Admitting: Gastroenterology

## 2009-07-12 ENCOUNTER — Telehealth (INDEPENDENT_AMBULATORY_CARE_PROVIDER_SITE_OTHER): Payer: Self-pay | Admitting: Cardiology

## 2009-07-20 ENCOUNTER — Telehealth: Payer: Self-pay | Admitting: Internal Medicine

## 2009-07-21 ENCOUNTER — Ambulatory Visit: Payer: Self-pay | Admitting: Internal Medicine

## 2009-07-21 ENCOUNTER — Telehealth (INDEPENDENT_AMBULATORY_CARE_PROVIDER_SITE_OTHER): Payer: Self-pay | Admitting: *Deleted

## 2009-07-24 ENCOUNTER — Telehealth: Payer: Self-pay | Admitting: Internal Medicine

## 2009-08-04 ENCOUNTER — Ambulatory Visit: Payer: Self-pay | Admitting: Internal Medicine

## 2009-08-14 ENCOUNTER — Ambulatory Visit: Payer: Self-pay | Admitting: Internal Medicine

## 2009-08-17 ENCOUNTER — Ambulatory Visit: Payer: Self-pay | Admitting: Internal Medicine

## 2009-08-17 DIAGNOSIS — J40 Bronchitis, not specified as acute or chronic: Secondary | ICD-10-CM

## 2009-08-17 LAB — CONVERTED CEMR LAB
Albumin: 3.7 g/dL (ref 3.5–5.2)
Bilirubin, Direct: 0 mg/dL (ref 0.0–0.3)
Cholesterol: 210 mg/dL — ABNORMAL HIGH (ref 0–200)
HDL: 35.9 mg/dL — ABNORMAL LOW (ref >39.00)
Total CHOL/HDL Ratio: 6

## 2009-08-18 ENCOUNTER — Encounter: Payer: Self-pay | Admitting: Internal Medicine

## 2009-08-24 ENCOUNTER — Ambulatory Visit: Payer: Self-pay | Admitting: Cardiology

## 2009-08-28 ENCOUNTER — Encounter: Payer: Self-pay | Admitting: Internal Medicine

## 2009-09-22 ENCOUNTER — Ambulatory Visit: Payer: Self-pay | Admitting: Internal Medicine

## 2009-09-25 ENCOUNTER — Encounter: Payer: Self-pay | Admitting: Internal Medicine

## 2009-09-26 ENCOUNTER — Telehealth: Payer: Self-pay | Admitting: Internal Medicine

## 2009-09-27 ENCOUNTER — Telehealth (INDEPENDENT_AMBULATORY_CARE_PROVIDER_SITE_OTHER): Payer: Self-pay | Admitting: *Deleted

## 2009-09-27 ENCOUNTER — Encounter: Payer: Self-pay | Admitting: Internal Medicine

## 2009-10-03 ENCOUNTER — Telehealth: Payer: Self-pay | Admitting: Internal Medicine

## 2009-10-17 ENCOUNTER — Telehealth: Payer: Self-pay | Admitting: Internal Medicine

## 2009-10-17 ENCOUNTER — Ambulatory Visit: Payer: Self-pay | Admitting: Internal Medicine

## 2009-12-04 ENCOUNTER — Telehealth: Payer: Self-pay | Admitting: Gastroenterology

## 2009-12-11 ENCOUNTER — Ambulatory Visit: Payer: Self-pay | Admitting: Internal Medicine

## 2009-12-11 DIAGNOSIS — R05 Cough: Secondary | ICD-10-CM

## 2009-12-18 ENCOUNTER — Ambulatory Visit: Payer: Self-pay | Admitting: Internal Medicine

## 2010-01-02 ENCOUNTER — Telehealth (INDEPENDENT_AMBULATORY_CARE_PROVIDER_SITE_OTHER): Payer: Self-pay | Admitting: *Deleted

## 2010-01-09 ENCOUNTER — Telehealth (INDEPENDENT_AMBULATORY_CARE_PROVIDER_SITE_OTHER): Payer: Self-pay | Admitting: *Deleted

## 2010-01-10 ENCOUNTER — Encounter: Payer: Self-pay | Admitting: Internal Medicine

## 2010-01-11 ENCOUNTER — Encounter (HOSPITAL_COMMUNITY): Admission: RE | Admit: 2010-01-11 | Discharge: 2010-05-02 | Payer: Self-pay | Admitting: Internal Medicine

## 2010-01-14 ENCOUNTER — Encounter: Payer: Self-pay | Admitting: Internal Medicine

## 2010-02-08 ENCOUNTER — Telehealth (INDEPENDENT_AMBULATORY_CARE_PROVIDER_SITE_OTHER): Payer: Self-pay | Admitting: *Deleted

## 2010-02-08 ENCOUNTER — Ambulatory Visit: Payer: Self-pay | Admitting: Internal Medicine

## 2010-02-09 ENCOUNTER — Ambulatory Visit: Payer: Self-pay | Admitting: Internal Medicine

## 2010-03-23 ENCOUNTER — Ambulatory Visit: Payer: Self-pay | Admitting: Internal Medicine

## 2010-04-01 ENCOUNTER — Encounter: Payer: Self-pay | Admitting: Internal Medicine

## 2010-04-02 ENCOUNTER — Telehealth: Payer: Self-pay | Admitting: Internal Medicine

## 2010-04-11 ENCOUNTER — Ambulatory Visit: Payer: Self-pay | Admitting: Internal Medicine

## 2010-04-11 LAB — CONVERTED CEMR LAB
ALT: 18 units/L (ref 0–35)
Albumin: 3.6 g/dL (ref 3.5–5.2)
Alkaline Phosphatase: 90 units/L (ref 39–117)
Bilirubin, Direct: 0.1 mg/dL (ref 0.0–0.3)
Cholesterol: 225 mg/dL — ABNORMAL HIGH (ref 0–200)
Eosinophils Absolute: 1.2 10*3/uL — ABNORMAL HIGH (ref 0.0–0.7)
Eosinophils Relative: 12.9 % — ABNORMAL HIGH (ref 0.0–5.0)
HCT: 36.8 % (ref 36.0–46.0)
Hemoglobin: 12.3 g/dL (ref 12.0–15.0)
Lymphs Abs: 2.7 10*3/uL (ref 0.7–4.0)
MCHC: 33.5 g/dL (ref 30.0–36.0)
Monocytes Absolute: 0.8 10*3/uL (ref 0.1–1.0)
Monocytes Relative: 8.2 % (ref 3.0–12.0)
Neutro Abs: 4.7 10*3/uL (ref 1.4–7.7)
RBC: 4.3 M/uL (ref 3.87–5.11)
RDW: 13.9 % (ref 11.5–14.6)
Total Bilirubin: 0.6 mg/dL (ref 0.3–1.2)
Total Protein: 7.6 g/dL (ref 6.0–8.3)
Triglycerides: 125 mg/dL (ref 0.0–149.0)

## 2010-04-23 ENCOUNTER — Telehealth (INDEPENDENT_AMBULATORY_CARE_PROVIDER_SITE_OTHER): Payer: Self-pay | Admitting: *Deleted

## 2010-04-26 ENCOUNTER — Encounter: Payer: Self-pay | Admitting: Internal Medicine

## 2010-04-30 ENCOUNTER — Encounter: Payer: Self-pay | Admitting: Internal Medicine

## 2010-04-30 ENCOUNTER — Telehealth (INDEPENDENT_AMBULATORY_CARE_PROVIDER_SITE_OTHER): Payer: Self-pay | Admitting: *Deleted

## 2010-05-16 ENCOUNTER — Ambulatory Visit: Payer: Self-pay | Admitting: Internal Medicine

## 2010-06-18 ENCOUNTER — Ambulatory Visit: Payer: Self-pay | Admitting: Internal Medicine

## 2010-06-18 DIAGNOSIS — F411 Generalized anxiety disorder: Secondary | ICD-10-CM

## 2010-06-18 DIAGNOSIS — F419 Anxiety disorder, unspecified: Secondary | ICD-10-CM

## 2010-06-18 HISTORY — DX: Generalized anxiety disorder: F41.1

## 2010-07-03 ENCOUNTER — Telehealth: Payer: Self-pay | Admitting: Internal Medicine

## 2010-07-04 ENCOUNTER — Encounter: Admission: RE | Admit: 2010-07-04 | Discharge: 2010-07-04 | Payer: Self-pay | Admitting: Internal Medicine

## 2010-07-04 LAB — HM MAMMOGRAPHY: HM Mammogram: NEGATIVE

## 2010-07-10 ENCOUNTER — Ambulatory Visit: Payer: Self-pay | Admitting: Internal Medicine

## 2010-07-10 DIAGNOSIS — L272 Dermatitis due to ingested food: Secondary | ICD-10-CM | POA: Insufficient documentation

## 2010-08-09 ENCOUNTER — Encounter: Payer: Self-pay | Admitting: Internal Medicine

## 2010-09-08 ENCOUNTER — Encounter: Payer: Self-pay | Admitting: Internal Medicine

## 2010-09-20 ENCOUNTER — Telehealth: Payer: Self-pay | Admitting: Internal Medicine

## 2010-09-27 ENCOUNTER — Telehealth: Payer: Self-pay | Admitting: Internal Medicine

## 2010-10-09 ENCOUNTER — Encounter: Payer: Self-pay | Admitting: Internal Medicine

## 2010-10-09 ENCOUNTER — Ambulatory Visit: Payer: Self-pay | Admitting: Internal Medicine

## 2010-11-01 ENCOUNTER — Ambulatory Visit: Payer: Self-pay | Admitting: Internal Medicine

## 2010-11-06 ENCOUNTER — Encounter: Payer: Self-pay | Admitting: Internal Medicine

## 2010-11-06 ENCOUNTER — Ambulatory Visit: Payer: Self-pay | Admitting: Internal Medicine

## 2010-11-11 HISTORY — PX: CATARACT EXTRACTION W/ INTRAOCULAR LENS  IMPLANT, BILATERAL: SHX1307

## 2010-11-24 ENCOUNTER — Encounter: Payer: Self-pay | Admitting: Internal Medicine

## 2010-11-24 ENCOUNTER — Telehealth: Payer: Self-pay | Admitting: Internal Medicine

## 2010-11-29 ENCOUNTER — Telehealth: Payer: Self-pay | Admitting: Internal Medicine

## 2010-12-01 ENCOUNTER — Encounter: Payer: Self-pay | Admitting: Pulmonary Disease

## 2010-12-02 ENCOUNTER — Encounter: Payer: Self-pay | Admitting: Internal Medicine

## 2010-12-02 ENCOUNTER — Encounter: Payer: Self-pay | Admitting: Orthopedic Surgery

## 2010-12-03 ENCOUNTER — Encounter: Payer: Self-pay | Admitting: Internal Medicine

## 2010-12-04 ENCOUNTER — Encounter: Payer: Self-pay | Admitting: Internal Medicine

## 2010-12-04 ENCOUNTER — Ambulatory Visit
Admission: RE | Admit: 2010-12-04 | Discharge: 2010-12-04 | Payer: Self-pay | Source: Home / Self Care | Attending: Internal Medicine | Admitting: Internal Medicine

## 2010-12-09 ENCOUNTER — Encounter: Payer: Self-pay | Admitting: Internal Medicine

## 2010-12-11 NOTE — Progress Notes (Signed)
Summary: REFILL   Phone Note Refill Request   Refills Requested: Medication #1:  HYDROCODONE-ACETAMINOPHEN 7.5-750 MG  TABS Take 1 tablet by mouth three times a day as needed [facility] [address]  Initial call taken by: Lamar Sprinkles, CMA,  September 27, 2010 2:53 PM  Follow-up for Phone Call        ok with 2 refills.  Follow-up by: Jacques Navy MD,  September 27, 2010 6:35 PM    Prescriptions: HYDROCODONE-ACETAMINOPHEN 7.5-750 MG  TABS (HYDROCODONE-ACETAMINOPHEN) Take 1 tablet by mouth three times a day as needed  #90 x 2   Entered by:   Lamar Sprinkles, CMA   Authorized by:   Jacques Navy MD   Signed by:   Lamar Sprinkles, CMA on 09/28/2010   Method used:   Telephoned to ...       [facility]  [address] [phone]* (retail)       [address]       [place], [place]  [address]       Ph: [phone]       Fax: [phone]   RxID:   [id number]

## 2010-12-11 NOTE — Medication Information (Signed)
Summary: Albuterol/[facility]  Albuterol/[facility]   Imported By: Sherian Rein 01/18/2010 12:24:09  _____________________________________________________________________  External Attachment:    Type:   Image     Comment:   External Document

## 2010-12-11 NOTE — Progress Notes (Signed)
Summary: speak to nurse  Phone Note Call from Patient Call back at Home Phone [phone]   Caller: Patient Call For: young Summary of Call: pt wants to change from albuterol to brovana or something else.  Initial call taken by: Tivis Ringer, CNA,  April 23, 2010 12:41 PM  Follow-up for Phone Call        Pt is requesting an rx for brovana. She states she doesn't feel like the symbicort and albuterol are working well for her. She states she feel sthat Brovana and spiriva will work better for her. Pt uses [facility]. Please advise.Carron Curie CMA  April 23, 2010 2:58 PM   Additional Follow-up for Phone Call Additional follow up Details #1::        Per CDY- ok to give Spiriva #30 1 daily with as needed refills and Brovana neb solution #60 1 two times a day with as needed refills.Reynaldo Minium CMA  April 23, 2010 5:20 PM   unable to locate [facility] in EMR.  rx's printed off for CDY to sign to fax to pharmacy. Boone Master CNA/MA  April 23, 2010 5:26 PM     Additional Follow-up for Phone Call Additional follow up Details #2::    Rx's signed and faxed to [facility] home.Reynaldo Minium CMA  April 23, 2010 5:39 PM   New/Updated Medications: SPIRIVA HANDIHALER 18 MCG CAPS (TIOTROPIUM BROMIDE MONOHYDRATE) Take 1 time per day BROVANA 15 MCG/2ML NEBU (ARFORMOTEROL TARTRATE) inhale 1 vial via HHN two times a day Prescriptions: SPIRIVA HANDIHALER 18 MCG CAPS (TIOTROPIUM BROMIDE MONOHYDRATE) Take 1 time per day  #30 x 6   Entered by:   Boone Master CNA/MA   Authorized by:   Waymon Budge MD   Signed by:   Boone Master CNA/MA on 04/23/2010   Method used:   Printed then faxed to ...       [facility]  [address] [phone]* (retail)       [address]       [place], [place]  [address]       Ph: [phone]       Fax: [phone]   RxID:   [id number] BROVANA 15 MCG/2ML NEBU (ARFORMOTEROL TARTRATE) inhale 1 vial via HHN two times a day  #60 x 6   Entered by:   Boone Master CNA/MA  Authorized by:   Waymon Budge MD   Signed by:   Boone Master CNA/MA on 04/23/2010   Method used:   Printed then faxed to ...       [facility]  [address] [phone]* (retail)       [address]       [place], [place]  [address]       Ph: [phone]       Fax: [phone]   RxID:   [id number]

## 2010-12-11 NOTE — Progress Notes (Signed)
Summary: letter  Phone Note Call from Patient Call back at Home Phone [phone]   Caller: Patient Call For: young Reason for Call: Talk to Nurse Summary of Call: need a letter from CDY - re: her condition. Initial call taken by: Eugene Gavia,  January 09, 2010 4:12 PM  Follow-up for Phone Call        called and spoke with pt.  pt is requesting CY handwrite a letter (with our letterhead on it) explaining pt's diagnosis and condition.  pt states she needs this letter for housing so she can get a house without carpet in it.  pt requests this letter be mailed to her home address.  Aundra Millet Reynolds LPN  January 09, 1477 4:18 PM   Additional Follow-up for Phone Call Additional follow up Details #1::        letter comprised by CDY on prescription pad.  copy made to scan into EMR.  original placed in mail per pt's request.  pt is aware. Boone Master CNA  January 10, 2010 4:45 PM
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
## 2010-12-11 NOTE — Miscellaneous (Signed)
Summary: Injection Record / [facility]    Injection Record /  Allergy    Imported By: Lennie Odor 04/03/2010 15:25:10  _____________________________________________________________________  External Attachment:    Type:   Image     Comment:   External Document
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
## 2010-12-11 NOTE — Letter (Signed)
Summary: [facility]  [facility]   Imported By: Lester [place] 08/13/2010 08:36:28  _____________________________________________________________________  External Attachment:    Type:   Image     Comment:   External Document

## 2010-12-11 NOTE — Letter (Signed)
Summary: [facility]  [facility]   Imported By: Lester [place] 08/15/2010 10:45:52  _____________________________________________________________________  External Attachment:    Type:   Image     Comment:   External Document
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
## 2010-12-11 NOTE — Medication Information (Signed)
Summary: [facility]   [facility]   Imported By: Lester [place] 05/04/2010 08:06:26  _____________________________________________________________________  External Attachment:    Type:   Image     Comment:   External Document
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
## 2010-12-11 NOTE — Progress Notes (Signed)
Summary: PRESCRIPT  Phone Note Call from Patient   Caller: Patient Call For: [name] Summary of Call: [name] [facility]- [phone] Initial call taken by: Rickard Patience,  April 30, 2010 1:33 PM  Follow-up for Phone Call        Called and spoke with pt.  She states that [facility] states they never recieved rx for brovana, so I called in rx for #60 with 6 refills.  Pt aware this has been done. Follow-up by: Vernie Murders,  April 30, 2010 3:19 PM

## 2010-12-11 NOTE — Letter (Signed)
Summary: Letter to housing regarding health issues/[facility]  Letter to housing regarding health issues/[facility]   Imported By: Sherian Rein 01/12/2010 10:50:28  _____________________________________________________________________  External Attachment:    Type:   Image     Comment:   External Document

## 2010-12-11 NOTE — Letter (Signed)
Summary: [facility]  [facility]N/[facility]   Imported By: Lester Greenhorn 09/11/2010 09:40:57  _____________________________________________________________________  External Attachment:    Type:   Image     Comment:   External Document
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
## 2010-12-11 NOTE — Miscellaneous (Signed)
Summary: PAP/[facility]  [facility]AP/[facility]   Imported By: Sherian Rein 04/13/2010 07:58:25  _____________________________________________________________________  External Attachment:    Type:   Image     Comment:   External Document
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
## 2010-12-11 NOTE — Progress Notes (Signed)
Summary: Xopenex changed to [employer] for insurance coverage.   Phone Note Other Incoming   Summary of Call: [facility] sends note saying patient wants to change Xopenex to Proventil or Proair so insurance will pay. Initial call taken by: Waymon Budge MD,  September 20, 2010 12:34 PM    New/Updated Medications: PROAIR HFA 108 (90 BASE) MCG/ACT AERS (ALBUTEROL SULFATE) 2 puffs four times a day as needed rescue inhaler. Prescriptions: PROAIR HFA 108 (90 BASE) MCG/ACT AERS (ALBUTEROL SULFATE) 2 puffs four times a day as needed rescue inhaler.  #1 x prn   Entered and Authorized by:   Waymon Budge MD   Signed by:   Waymon Budge MD on 09/20/2010   Method used:   Historical   RxID:   [id number]

## 2010-12-11 NOTE — Progress Notes (Signed)
Summary: next COL?   Phone Note Call from Patient Call back at Work Phone [phone]   Caller: Patient Call For: Dr. Russella Dar Reason for Call: Talk to Nurse Summary of Call: pt says she just received a recall letter from our office, but her recall isnt until 2014 in [facility]... please call pt to discuss when she should have her next COL... pt thinks she should this year Initial call taken by: Vallarie Mare,  December 04, 2009 2:10 PM  Follow-up for Phone Call        Patient  recieved a letter last year she was due for a colon.  I revieweed the last office note from 05/29/2009, Dr Iva Lento changed her recall to 02/2013.  Patient  notified. Follow-up by: Darcey Nora RN, CGRN,  December 04, 2009 2:53 PM

## 2010-12-11 NOTE — Progress Notes (Signed)
Summary: sob/wheezing  Phone Note From Other Clinic Call back at [phone]   Caller: pulmonary hab  phyllis Call For: young Summary of Call: pt wheezing and sob . Initial call taken by: Rickard Patience,  February 08, 2010 1:44 PM  Follow-up for Phone Call        called, spoke with Jamesetta So at [facility].  Per Jamesetta So, pt is having increased SOB for a couple of days and had to get up last night to use nebs.  Pt also coughing up a lot of gray mucus ( more than normal).  Per Jamesetta So, pt is very tight in chest and has wheezes. Spoke with Florentina Addison, ok to [place] RN slot for this evening.  Called Phyllis back, informed her we can work pt in today at 4:15 with CY - she verbalized understanding and stated she would inform pt of appt.   Follow-up by: Gweneth Dimitri RN,  February 08, 2010 2:05 PM

## 2010-12-11 NOTE — Progress Notes (Signed)
Summary: REFILL -Hydrocodone/Stefannie Defeo pt  Phone Note Refill Request Message from:  Pharmacy  Refills Requested: Medication #1:  HYDROCODONE-ACETAMINOPHEN 7.5-750 MG  TABS Take 1 tablet by mouth three times a day as needed OK to call in refill?  Initial call taken by: Lamar Sprinkles, CMA,  Apr 02, 2010 4:10 PM  Follow-up for Phone Call        ok to ref Follow-up by: Tresa Garter MD,  Apr 02, 2010 5:55 PM    Prescriptions: HYDROCODONE-ACETAMINOPHEN 7.5-750 MG  TABS (HYDROCODONE-ACETAMINOPHEN) Take 1 tablet by mouth three times a day as needed  #90 x 2   Entered by:   Lamar Sprinkles, CMA   Authorized by:   Jacques Navy MD   Signed by:   Lamar Sprinkles, CMA on 04/02/2010   Method used:   Telephoned to ...       [facility]  [address] [phone]* (retail)       [address]       [place], [place]  [address]       Ph: [phone]       Fax: [phone]   RxID:   [id number]

## 2010-12-11 NOTE — Letter (Signed)
[facility] [address] [place], [place]  [address] Phone: [phone]      April 26, 2010   BEYA TIPPS [address] [place], [place] [address]  RE:  LAB RESULTS  Dear  Ms. WOS,  The following is an interpretation of your most recent lab tests.  Please take note of any instructions provided or changes to medications that have resulted from your lab work.  ELECTROLYTES:  Good - no changes needed  KIDNEY FUNCTION TESTS:  Good - no changes needed  LIVER FUNCTION TESTS:  Good - no changes needed  LIPID PANEL:  Fair - review at your next visit Triglyceride: 125.0   Cholesterol: 225   LDL: DEL   HDL: 49.90   Chol/HDL%:  5  THYROID STUDIES:  Thyroid studies normal TSH: 0.92     DIABETIC STUDIES:  Good - no changes needed Blood Glucose: 98    CBC:  Good - no changes needed  LDL cholesterol to high at 156, goal is 100 or less. Recommend starting a statin drug for control. Please come in for an office visit so we can discuss treatment options    Sincerely Yours,    Jacques Navy MD Patient: [name] [name] Note: All result statuses are Final unless otherwise noted.  Tests: (1) CBC Platelet w/Diff (CBCD)   White Cell Count          9.4 K/uL                    4.5-10.5   Red Cell Count            4.30 Mil/uL                 3.87-5.11   Hemoglobin                12.3 g/dL                   29.5-62.1   Hematocrit                36.8 %                      36.0-46.0   MCV                       85.7 fl                     78.0-100.0   MCHC                      33.5 g/dL                   30.8-65.7   RDW                       13.9 %                      11.5-14.6   Platelet Count            246.0 K/uL                  150.0-400.0   Neutrophil %              49.8 %                      43.0-77.0  Lymphocyte %              28.5 %                      12.0-46.0   Monocyte %                8.2 %                       3.0-12.0   Eosinophils%         [H]  12.9 %                       0.0-5.0   Basophils %               0.6 %                       0.0-3.0   Neutrophill Absolute      4.7 K/uL                    1.4-7.7   Lymphocyte Absolute       2.7 K/uL                    0.7-4.0   Monocyte Absolute         0.8 K/uL                    0.1-1.0  Eosinophils, Absolute                        [H]  1.2 K/uL                    0.0-0.7   Basophils Absolute        0.1 K/uL                    0.0-0.1  Tests: (2) Lipid Panel (LIPID)   Cholesterol          [H]  225 mg/dL                   7-169     ATP III Classification            Desirable:  < 200 mg/dL                    Borderline High:  200 - 239 mg/dL               High:  > = 240 mg/dL   Triglycerides             125.0 mg/dL                 6.7-893.8     Normal:  <150 mg/dL     Borderline High:  101 - 199 mg/dL   HDL                       75.10 mg/dL                 >25.85   VLDL Cholesterol          25.0 mg/dL                  2.7-78.2  CHO/HDL Ratio:  CHD Risk  5                    Men          Women     1/2 Average Risk     3.4          3.3     Average Risk          5.0          4.4     2X Average Risk          9.6          7.1     3X Average Risk          15.0          11.0                           Tests: (3) Hepatic/Liver Function Panel (HEPATIC)   Total Bilirubin           0.6 mg/dL                   1.6-1.0   Direct Bilirubin          0.1 mg/dL                   9.6-0.4   Alkaline Phosphatase      90 U/L                      39-117   AST                       23 U/L                      0-37   ALT                       18 U/L                      0-35   Total Protein             7.6 g/dL                    5.4-0.9   Albumin                   3.6 g/dL                    8.1-1.9  Tests: (4) BMP (METABOL)   Sodium                    140 mEq/L                   135-145   Potassium                 3.9 mEq/L                   3.5-5.1   Chloride                   104 mEq/L                   96-112   Carbon Dioxide            29 mEq/L  19-32   Glucose                   98 mg/dL                    59-56   BUN                       19 mg/dL                    3-87   Creatinine                1.2 mg/dL                   5.6-4.3   Calcium                   9.3 mg/dL                   3.2-95.1   GFR                       57.60 mL/min                >60  Tests: (5) Cholesterol LDL - Direct (DIRLDL)  Cholesterol LDL - Direct                             156.2 mg/dL     Optimal:  <884 mg/dL     Near or Above Optimal:  100-129 mg/dL     Borderline High:  166-063 mg/dL     High:  016-010 mg/dL     Very High:  >932 mg/dL

## 2010-12-11 NOTE — Progress Notes (Signed)
Summary: Refill Spiriva  Phone Note Refill Request Message from:  Fax from Pharmacy on July 03, 2010 8:28 AM  Refills Requested: Medication #1:  SPIRIVA HANDIHALER 18 MCG CAPS Take 1 time per day Per pharmacy note the patient does not have anything from June.  Initial call taken by: Lucious Groves CMA,  July 03, 2010 8:29 AM    Prescriptions: SPIRIVA HANDIHALER 18 MCG CAPS (TIOTROPIUM BROMIDE MONOHYDRATE) Take 1 time per day  #30 x 6   Entered by:   Lucious Groves CMA   Authorized by:   Jacques Navy MD   Signed by:   Lucious Groves CMA on 07/03/2010   Method used:   Electronically to        [facility] [address] [address] [phone]* (retail)       [address]       [place], [place]  [address]       Ph: [phone]       Fax: [phone]   RxID:   [id number]

## 2010-12-11 NOTE — Assessment & Plan Note (Signed)
Summary: FU / NWS  #   Vital Signs:  Patient profile:   70 year old female Height:      63.5 inches (161.29 cm) Weight:      184.50 pounds (83.86 kg) O2 Sat:      95 % on Room air Temp:     97.9 degrees F (36.61 degrees C) oral Pulse rate:   69 / minute BP sitting:   160 / 88  (left arm) Cuff size:   regular  Vitals Entered By: Bill Salinas CMA (February 09, 2010 3:25 PM)/ Sydell Axon, SMA  O2 Flow:  Room air CC: pt here for follow up visit, pt declined flu vax and pneumovax, pt states last TD was in 2006, per pt, bone density done 2/11, pap scheduled 5/11. Per pt, she is no longer taking Coq 10 caps or Pantanase.// [place]   Primary Care Provider:  [name], MD  CC:  pt here for follow up visit, pt declined flu vax and pneumovax, pt states last TD was in 2006, per pt, bone density done 2/11, pap scheduled 5/11. Per pt, and she is no longer taking Coq 10 caps or Pantanase.// [place].  History of Present Illness: Seen by Dr. Maple Hudson 3/31 and was started prednisone for a flare of COPD/asthma.  She is here today for medication follow-up. She reports that cymbalta 30mg  once daily has helped chestwall pain.  She reports that the carafate did not provide any real relief of GI symptoms and did cause some nausea.  Her mother passed away in the last month at age 19. This has  been a hard  blow. There are some issues about her mother's care and that she wasn't there to help with her care.   Current Medications (verified): 1)  Nexium 40 Mg  Cpdr (Esomeprazole Magnesium) .Marland Kitchen.. 1 By Mouth Two Times A Day 2)  Singulair 10 Mg  Tabs (Montelukast Sodium) .... Take One Tab By Mouth Once Daily 3)  Restasis 0.05 %  Emul (Cyclosporine) .... Use As Directed 4)  Hydrochlorothiazide 25 Mg  Tabs (Hydrochlorothiazide) .... Take 1 Tablet By Mouth Once A Day 5)  Symbicort 160-4.5 Mcg/act  Aero (Budesonide-Formoterol Fumarate) .... 2 Puffs Two Times A Day As Needed 6)  Patanol 0.1 %  Soln (Olopatadine Hcl)  .... Both Eyes 7)  Hydrocodone-Acetaminophen 7.5-750 Mg  Tabs (Hydrocodone-Acetaminophen) .... Take 1 Tablet By Mouth Three Times A Day As Needed 8)  Albuterol Sulfate (2.5 Mg/3ml) 0.083% Nebu (Albuterol Sulfate) .... Use 1 Vial in [employer] Two Times Per Day As Needed 9)  Patanase 0.6 %  Soln (Olopatadine Hcl) .Marland Kitchen.. 1-2 Sprays Each Nostril Twice Daily As Needed 10)  Epipen 0.3 Mg/0.23ml (1:1000) Devi (Epinephrine Hcl (Anaphylaxis)) .... For Severe Allergic Reaction 11)  Spiriva Handihaler 18 Mcg Caps (Tiotropium Bromide Monohydrate) .... Take 1 Time Per Day 12)  Coq10 200 Mg Caps (Coenzyme Q10) .... Take 1 By Mouth Once Daily 13)  Cymbalta 30 Mg Cpep (Duloxetine Hcl) .Marland Kitchen.. 1 By Mouth Once Daily For Chest Wall Pain, Possible Fibromyalgia 14)  Xyzal 5 Mg Tabs (Levocetirizine Dihydrochloride) .Marland Kitchen.. 1 Daily As Needed Antihistamine 15)  Prednisone 10 Mg Tabs (Prednisone) .Marland Kitchen.. 1 Tab Four Times Daily X 2 Days, 3 Times Daily X 2 Days, 2 Times Daily X 2 Days, 1 Time Daily and Try Continuing This  Allergies (verified): 1)  ! Pcn 2)  ! Sulfa 3)  ! Voltaren (Diclofenac Sodium) 4)  ! * Pylera PMH-FH-SH reviewed-no changes except otherwise  noted  Review of Systems       The patient complains of dyspnea on exertion, prolonged cough, headaches, and depression.  The patient denies anorexia, weight loss, chest pain, abdominal pain, hematochezia, difficulty walking, and abnormal bleeding.    Physical Exam  General:  overweight AA female who is emotionally distraught but in no acute distress medically Head:  Normocephalic and atraumatic without obvious abnormalities. No apparent alopecia or balding. Lungs:  mild increased work of breathing, porlonged expiratory phase with diffuse wheezing.  Heart:  normal rate and regular rhythm.   Psych:  Oriented X3, memory intact for recent and remote, normally interactive, depressed affect, and tearful.     Impression & Recommendations:  Problem # 1:  BRONCHITIS NOT  SPECIFIED AS ACUTE OR CHRONIC (ICD-490) Acute flare - under the care of Dr. Maple Hudson  Her updated medication list for this problem includes:    Singulair 10 Mg Tabs (Montelukast sodium) .Marland Kitchen... Take one tab by mouth once daily    Symbicort 160-4.5 Mcg/act Aero (Budesonide-formoterol fumarate) .Marland Kitchen... 2 puffs two times a day as needed    Albuterol Sulfate (2.5 Mg/34ml) 0.083% Nebu (Albuterol sulfate) ..... Use 1 vial in neb two times per day as needed    Spiriva Handihaler 18 Mcg Caps (Tiotropium bromide monohydrate) .Marland Kitchen... Take 1 time per day  Problem # 2:  CHEST WALL PAIN, ACUTE (ZOX-096.04) Patient did get a good response to cymbalta 30mg . She is still having chest wall pain. In addition, she has a situation grief reaction.  Plan - increase cymbalta to 60mg  once daily (2 weeks of samples provided)  Complete Medication List: 1)  Nexium 40 Mg Cpdr (Esomeprazole magnesium) .Marland Kitchen.. 1 by mouth two times a day 2)  Singulair 10 Mg Tabs (Montelukast sodium) .... Take one tab by mouth once daily 3)  Restasis 0.05 % Emul (Cyclosporine) .... Use as directed 4)  Hydrochlorothiazide 25 Mg Tabs (Hydrochlorothiazide) .... Take 1 tablet by mouth once a day 5)  Symbicort 160-4.5 Mcg/act Aero (Budesonide-formoterol fumarate) .... 2 puffs two times a day as needed 6)  Patanol 0.1 % Soln (Olopatadine hcl) .... Both eyes 7)  Hydrocodone-acetaminophen 7.5-750 Mg Tabs (Hydrocodone-acetaminophen) .... Take 1 tablet by mouth three times a day as needed 8)  Albuterol Sulfate (2.5 Mg/65ml) 0.083% Nebu (Albuterol sulfate) .... Use 1 vial in neb two times per day as needed 9)  Patanase 0.6 % Soln (Olopatadine hcl) .Marland Kitchen.. 1-2 sprays each nostril twice daily as needed 10)  Epipen 0.3 Mg/0.59ml (1:1000) Devi (Epinephrine hcl (anaphylaxis)) .... For severe allergic reaction 11)  Spiriva Handihaler 18 Mcg Caps (Tiotropium bromide monohydrate) .... Take 1 time per day 12)  Coq10 200 Mg Caps (Coenzyme q10) .... Take 1 by mouth once  daily 13)  Cymbalta 30 Mg Cpep (Duloxetine hcl) .Marland Kitchen.. 1 by mouth once daily for chest wall pain, possible fibromyalgia 14)  Xyzal 5 Mg Tabs (Levocetirizine dihydrochloride) .Marland Kitchen.. 1 daily as needed antihistamine 15)  Prednisone 10 Mg Tabs (Prednisone) .Marland Kitchen.. 1 tab four times daily x 2 days, 3 times daily x 2 days, 2 times daily x 2 days, 1 time daily and try continuing this  Preventive Care Screening  Bone Density:    Date:  12/12/2009    Results:  done std dev  Last Tetanus Booster:    Date:  01/09/2005    Results:  given

## 2010-12-11 NOTE — Progress Notes (Signed)
Summary: fax from [facility]  Phone Note From Other Clinic Call back at [phone] ex [phone]   Caller: [facility] Call For: young Summary of Call: calling about fax sent on the 17th for albuterol have not received it back. Initial call taken by: Rickard Patience,  January 02, 2010 1:16 PM  Follow-up for Phone Call        Katie, have you seen paperwork on this pt re albuterol from [facility]?  Aundra Millet Reynolds LPN  January 02, 2010 2:15 PM    I have not seen this form; please see if company can refax to triage fax and will have CDY sign today.Reynaldo Minium CMA  January 03, 2010 9:05 AM   Additional Follow-up for Phone Call Additional follow up Details #1::        LMOVM for [facility] stating that form was not received and we need it faxed to triage faxline.   Additional Follow-up by: Vernie Murders,  January 03, 2010 9:08 AM
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
## 2010-12-13 NOTE — Miscellaneous (Signed)
Summary: Orders Update pft charges  Clinical Lists Changes  Orders: Added new Service order of Carbon Monoxide diffusing w/capacity (94720) - Signed Added new Service order of Lung Volumes (94240) - Signed Added new Service order of Spirometry (Pre & Post) (94060) - Signed 

## 2010-12-13 NOTE — Progress Notes (Signed)
  Phone Note Refill Request Message from:  Fax from Pharmacy on November 29, 2010 2:01 PM  Refills Requested: Medication #1:  ALPRAZOLAM 0.5 MG TABS 1 by mouth q 6 as needed panic/anxiety   Last Refilled: 11/03/2010 Please Advise refills  Initial call taken by: Ami Bullins CMA,  November 29, 2010 2:01 PM  Follow-up for Phone Call        ok for refills x 5 Follow-up by: Jacques Navy MD,  November 29, 2010 5:38 PM    Prescriptions: ALPRAZOLAM 0.5 MG TABS (ALPRAZOLAM) 1 by mouth q 6 as needed panic/anxiety  #60 x 5   Entered by:   Ami Bullins CMA   Authorized by:   Jacques Navy MD   Signed by:   Bill Salinas CMA on 11/30/2010   Method used:   Telephoned to ...       [facility]  [address] [phone]* (retail)       [address]       [place], [place]  [address]       Ph: [phone]       Fax: [phone]   RxID:   [id number]

## 2010-12-13 NOTE — Letter (Signed)
Summary: [facility]s [employer] Underpads/[facility]  Briefs & Underpads/[facility]   Imported By: Sherian Rein 11/29/2010 11:18:44  _____________________________________________________________________  External Attachment:    Type:   Image     Comment:   External Document
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
## 2010-12-13 NOTE — Letter (Signed)
Summary: Briefs & Underpads/[facility]  Briefs & Underpads/[facility]   Imported By: Sherian Rein 11/29/2010 11:31:54  _____________________________________________________________________  External Attachment:    Type:   Image     Comment:   External Document

## 2010-12-13 NOTE — Progress Notes (Signed)
  Phone Note Other Incoming   Caller: DME provider Summary of Call: Received request for incontinence supplies. Reviewed chart: at last CPX -patient does report continued incontinence. Will add dx to problem list .  New Problems: OTHER URINARY INCONTINENCE (ICD-788.39)   New Problems: OTHER URINARY INCONTINENCE (ICD-788.39)

## 2010-12-19 NOTE — Medication Information (Signed)
Summary: Nebulizer/[facility]  Nebulizer/[facility]   Imported By: Sherian Rein 12/14/2010 09:23:18  _____________________________________________________________________  External Attachment:    Type:   Image     Comment:   External Document

## 2010-12-19 NOTE — Medication Information (Signed)
Summary: Nebulizer  / [facility]  Nebulizer  / [facility]   Imported By: Lennie Odor 12/10/2010 15:15:28  _____________________________________________________________________  External Attachment:    Type:   Image     Comment:   External Document

## 2010-12-25 ENCOUNTER — Encounter (INDEPENDENT_AMBULATORY_CARE_PROVIDER_SITE_OTHER): Payer: Self-pay | Admitting: *Deleted

## 2010-12-25 ENCOUNTER — Ambulatory Visit (INDEPENDENT_AMBULATORY_CARE_PROVIDER_SITE_OTHER): Payer: Medicare Other | Admitting: Internal Medicine

## 2010-12-25 ENCOUNTER — Other Ambulatory Visit: Payer: Medicare Other

## 2010-12-25 ENCOUNTER — Encounter: Payer: Self-pay | Admitting: Internal Medicine

## 2010-12-25 ENCOUNTER — Other Ambulatory Visit: Payer: Self-pay | Admitting: Internal Medicine

## 2010-12-25 DIAGNOSIS — F410 Panic disorder [episodic paroxysmal anxiety] without agoraphobia: Secondary | ICD-10-CM

## 2010-12-25 DIAGNOSIS — Z23 Encounter for immunization: Secondary | ICD-10-CM

## 2010-12-25 DIAGNOSIS — E785 Hyperlipidemia, unspecified: Secondary | ICD-10-CM

## 2010-12-25 DIAGNOSIS — J449 Chronic obstructive pulmonary disease, unspecified: Secondary | ICD-10-CM

## 2010-12-25 LAB — LIPID PANEL
HDL: 42 mg/dL (ref >39.00)
Total CHOL/HDL Ratio: 5

## 2010-12-25 LAB — HEPATIC FUNCTION PANEL
ALT: 15 U/L (ref 0–35)
AST: 21 U/L (ref 0–37)
Alkaline Phosphatase: 92 U/L (ref 39–117)
Bilirubin, Direct: 0.1 mg/dL (ref 0.0–0.3)
Total Bilirubin: 0.4 mg/dL (ref 0.3–1.2)

## 2010-12-27 NOTE — Medication Information (Signed)
Summary: Nebulizer/[facility]  Nebulizer/[facility]   Imported By: Sherian Rein 12/18/2010 09:20:22  _____________________________________________________________________  External Attachment:    Type:   Image     Comment:   External Document

## 2010-12-30 ENCOUNTER — Encounter: Payer: Self-pay | Admitting: Internal Medicine
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
## 2011-01-08 ENCOUNTER — Encounter: Payer: Self-pay | Admitting: Internal Medicine

## 2011-01-08 ENCOUNTER — Ambulatory Visit (INDEPENDENT_AMBULATORY_CARE_PROVIDER_SITE_OTHER): Payer: Medicare Other | Admitting: Internal Medicine

## 2011-01-08 DIAGNOSIS — J449 Chronic obstructive pulmonary disease, unspecified: Secondary | ICD-10-CM

## 2011-01-08 NOTE — Letter (Signed)
[facility] [address] [place], [place]  [address] Phone: [phone]      December 31, 2010   [name] [address] [place], [place] [address]  RE:  LAB RESULTS  Dear  [name],  The following is an interpretation of your most recent lab tests.  Please take note of any instructions provided or changes to medications that have resulted from your lab work.  LIVER FUNCTION TESTS:  Good - no changes needed  LIPID PANEL:  Stable - no changes needed Triglyceride: 95.0   Cholesterol: 189   LDL: 128   HDL: 42.00   Chol/HDL%:  5    uch improved lipid panel. Keep up the good work.   Sincerely Yours,    Jacques Navy MD  Patient: [name] [name] Note: All result statuses are Final unless otherwise noted.  Tests: (1) Lipid Panel (LIPID)   Cholesterol               189 mg/dL                   2-956     ATP III Classification            Desirable:  < 200 mg/dL                    Borderline High:  200 - 239 mg/dL               High:  > = 240 mg/dL   Triglycerides             95.0 mg/dL                  2.1-308.6     Normal:  <150 mg/dL     Borderline High:  578 - 199 mg/dL   HDL                       46.96 mg/dL                 >29.52   VLDL Cholesterol          19.0 mg/dL                  8.4-13.2   LDL Cholesterol      [H]  440 mg/dL                   1-02  CHO/HDL Ratio:  CHD Risk                             5                    Men          Women     1/2 Average Risk     3.4          3.3     Average Risk          5.0          4.4     2X Average Risk          9.6          7.1     3X Average Risk          15.0          11.0  Tests: (2) Hepatic/Liver Function Panel (HEPATIC)   Total Bilirubin           0.4 mg/dL                   5.4-0.9   Direct Bilirubin          0.1 mg/dL                   8.1-1.9   Alkaline Phosphatase      92 U/L                      39-117   AST                       21 U/L                       0-37   ALT                       15 U/L                      0-35   Total Protein             7.4 g/dL                    1.4-7.8   Albumin                   3.5 g/dL                    2.9-5.6

## 2011-01-11 ENCOUNTER — Telehealth (INDEPENDENT_AMBULATORY_CARE_PROVIDER_SITE_OTHER): Payer: Self-pay | Admitting: *Deleted

## 2011-01-14 ENCOUNTER — Telehealth (INDEPENDENT_AMBULATORY_CARE_PROVIDER_SITE_OTHER): Payer: Self-pay | Admitting: *Deleted
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
## 2011-01-17 NOTE — Progress Notes (Signed)
Summary: Albuterol 0.083% Neb Sol<<<file w/ MCR PART B  Phone Note Outgoing Call   Call placed by: Michel Bickers CMA,  January 11, 2011 3:26 PM Call placed to: [facility] [phone] Summary of Call: Request for PA on Albuterol 0.083% Neb Sol received from [employer]. Per pt's ins, [phone], this needs to be filed under MCR Part B. Pharnacist is aware and claim was run with no problem. Pharmacy will let the patient know. Nothing further needed from our office at this point. Initial call taken by: Michel Bickers CMA,  January 11, 2011 3:29 PM

## 2011-01-22 NOTE — Progress Notes (Signed)
Summary: ov notes requested  Phone Note From Pharmacy   Caller: Florentina Addison w/ med for home Call For: young  Summary of Call: needs last ov re: albuterol and brovana use. (for compliance). fax to attn: chancey [phone]. contact # is [phone] Addison or chance[facility] Initial call taken by: Tivis Ringer, CNA,  January 14, 2011 11:34 AM  Follow-up for Phone Call        Faxed note.//Juanita Follow-up by: Darletta Moll,  January 15, 2011 8:07 AM

## 2011-02-08 ENCOUNTER — Encounter: Payer: Self-pay | Admitting: Internal Medicine

## 2011-02-12 ENCOUNTER — Ambulatory Visit (INDEPENDENT_AMBULATORY_CARE_PROVIDER_SITE_OTHER): Payer: Medicare Other | Admitting: Internal Medicine

## 2011-02-12 ENCOUNTER — Encounter: Payer: Self-pay | Admitting: Internal Medicine

## 2011-02-12 VITALS — BP 124/74 | HR 66 | Ht 64.0 in | Wt 172.4 lb

## 2011-02-12 DIAGNOSIS — J328 Other chronic sinusitis: Secondary | ICD-10-CM

## 2011-02-12 DIAGNOSIS — J40 Bronchitis, not specified as acute or chronic: Secondary | ICD-10-CM

## 2011-02-12 DIAGNOSIS — J309 Allergic rhinitis, unspecified: Secondary | ICD-10-CM

## 2011-02-12 DIAGNOSIS — G4730 Sleep apnea, unspecified: Secondary | ICD-10-CM

## 2011-02-12 DIAGNOSIS — J449 Chronic obstructive pulmonary disease, unspecified: Secondary | ICD-10-CM

## 2011-02-12 MED ORDER — ALBUTEROL SULFATE HFA 108 (90 BASE) MCG/ACT IN AERS: 2.0000 | INHALATION_SPRAY | Freq: Four times a day (QID) | RESPIRATORY_TRACT | Status: DC | PRN

## 2011-02-12 MED ORDER — PREDNISONE 5 MG PO TABS: ORAL_TABLET | ORAL | Status: DC

## 2011-02-12 MED ORDER — OLOPATADINE HCL 0.6 % NA SOLN: 1.0000 | Freq: Two times a day (BID) | NASAL | Status: DC | PRN

## 2011-02-12 MED ORDER — LEVOCETIRIZINE DIHYDROCHLORIDE 5 MG PO TABS: 5.0000 mg | ORAL_TABLET | Freq: Every day | ORAL | Status: DC | PRN

## 2011-02-12 MED ORDER — BUDESONIDE-FORMOTEROL FUMARATE 160-4.5 MCG/ACT IN AERO: 2.0000 | INHALATION_SPRAY | Freq: Two times a day (BID) | RESPIRATORY_TRACT | Status: DC

## 2011-02-12 MED ORDER — EPINEPHRINE 0.3 MG/0.3ML IJ DEVI: 0.3000 mg | Freq: Once | INTRAMUSCULAR | Status: DC

## 2011-02-12 NOTE — Patient Instructions (Signed)
Refill scripts- call as needed  Ask the Pulmonary Rehab program if you can go back  Script to try prednisone 5-10 mg daily to stabilize your airway. Eventually you may find you can skip some days.

## 2011-02-12 NOTE — Assessment & Plan Note (Signed)
Severe problem. She gets impatient and frustrated and sometimes frightened, whyich makes it harder to sustain control. After discussion I agreed to try again with maintenance prednsione.

## 2011-02-12 NOTE — Progress Notes (Signed)
  Subjective:    Patient ID: [name] [name], female    DOB: 1941-05-18, 70 y.o.   MRN: [id number]  HPI 10 yoF with hx allergic rhintis, chronic rhinosinusitis, chronic obstructive asthma, GERD w/ gastroparesis, OSA. We gave prednisone taper and Z pak which helped a lot but for only  a week after she finished. Last here January 08, 2011. Since then she has again been coughing and wheezing most days. .  She asks script for clarinex and needs meds refill.  Cough is productive thick white mucus. She asks about trying maintenance prednisone again. Has previously faliled to stay on allergy vaccine, and Xolair ( blamed for rash on legs). Demonstrates thick, light brown mucus. She has trouble using CPAP when she coughs a lot at night.   Review of Systems See HPI Constitutional:   No weight loss, night sweats,  Fevers, chills, fatigue, lassitude. HEENT:   Some headaches, No- Difficulty swallowing,  Tooth/dental problems,  Sore throat,                No sneezing, itching, ear ache, Frequent- nasal congestion, post nasal drip,   CV:  No chest pain,  Orthopnea, PND, swelling in lower extremities, anasarca, dizziness, palpitations  GI  Occasional- heartburn, indigestion. abdominal pain;  No- nausea, vomiting, diarrhea, change in bowel habits, loss of appetite  Resp: Skin: no rash or lesions.  GU: no dysuria, change in color of urine, no urgency or frequency.  No flank pain.  MS:  No joint pain or swelling.  No decreased range of motion.  No back pain.  Psych:  No change in mood or affect. Frequent anxiety.  No memory loss.     Objective:   Physical Exam General- Alert, Oriented, Affect-appropriate, Distress- none acute, despite dramatic cough and wheeze  Skin- rash-none, lesions- none, excoriation- none  Lymphadenopathy- none  Head- atraumatic  Eyes- Gross vision intact, PERRLA, conjunctivae clear secretions  Ears- Normal-Hearing, canals, Tm L ,   R ,  Nose- Clear, NO- Septal dev, mucus,  polyps, erosion, perforation   Throat- Mallampati II , mucosa clear , drainage- none, tonsils- atrophic  Neck- flexible , trachea midline, no stridor , thyroid nl, carotid no bruit  Chest - symmetrical excursion , unlabored     Heart/CV- RRR , no murmur , no gallop  , no rub, nl s1 s2                     - JVD- none , edema- none, stasis changes- none, varices- none     Lung- diffuse musical wheeze, cough- active , dullness-none, rub- none, unlabored     Chest wall-   Abd- tender-no, distended-no, bowel sounds-present, HSM- no  Br/ Gen/ Rectal- Not done, not indicated  Extrem- cyanosis- none, clubbing, none, atrophy- none, strength- nl  Neuro- grossly intact to observation         Assessment & Plan:

## 2011-02-17 ENCOUNTER — Encounter: Payer: Self-pay | Admitting: Internal Medicine

## 2011-02-17 NOTE — Assessment & Plan Note (Signed)
Frequent complaints of nasal congestion and drainage. These tend to correspond to times when her asthma is worse. Both allergy and infection have played role at times.

## 2011-03-04 ENCOUNTER — Emergency Department (HOSPITAL_COMMUNITY): Payer: Medicare Other

## 2011-03-04 ENCOUNTER — Inpatient Hospital Stay (HOSPITAL_COMMUNITY)
Admission: EM | Admit: 2011-03-04 | Discharge: 2011-03-11 | DRG: 190 | Disposition: A | Discharge status: Home Health | Payer: Medicare Other | Attending: Internal Medicine | Admitting: Internal Medicine

## 2011-03-04 ENCOUNTER — Telehealth: Payer: Self-pay | Admitting: Internal Medicine

## 2011-03-04 DIAGNOSIS — IMO0002 Reserved for concepts with insufficient information to code with codable children: Secondary | ICD-10-CM

## 2011-03-04 DIAGNOSIS — J441 Chronic obstructive pulmonary disease with (acute) exacerbation: Principal | ICD-10-CM | POA: Diagnosis present

## 2011-03-04 DIAGNOSIS — Z880 Allergy status to penicillin: Secondary | ICD-10-CM

## 2011-03-04 DIAGNOSIS — J189 Pneumonia, unspecified organism: Secondary | ICD-10-CM | POA: Diagnosis present

## 2011-03-04 DIAGNOSIS — E785 Hyperlipidemia, unspecified: Secondary | ICD-10-CM | POA: Diagnosis present

## 2011-03-04 DIAGNOSIS — M79609 Pain in unspecified limb: Secondary | ICD-10-CM | POA: Diagnosis not present

## 2011-03-04 DIAGNOSIS — K219 Gastro-esophageal reflux disease without esophagitis: Secondary | ICD-10-CM | POA: Diagnosis present

## 2011-03-04 DIAGNOSIS — R911 Solitary pulmonary nodule: Secondary | ICD-10-CM | POA: Diagnosis present

## 2011-03-04 DIAGNOSIS — I10 Essential (primary) hypertension: Secondary | ICD-10-CM | POA: Diagnosis present

## 2011-03-04 DIAGNOSIS — K5900 Constipation, unspecified: Secondary | ICD-10-CM | POA: Diagnosis not present

## 2011-03-04 DIAGNOSIS — Z87891 Personal history of nicotine dependence: Secondary | ICD-10-CM

## 2011-03-04 DIAGNOSIS — J479 Bronchiectasis, uncomplicated: Secondary | ICD-10-CM | POA: Diagnosis present

## 2011-03-04 DIAGNOSIS — G4733 Obstructive sleep apnea (adult) (pediatric): Secondary | ICD-10-CM | POA: Diagnosis present

## 2011-03-04 DIAGNOSIS — J309 Allergic rhinitis, unspecified: Secondary | ICD-10-CM | POA: Diagnosis present

## 2011-03-04 DIAGNOSIS — Z79899 Other long term (current) drug therapy: Secondary | ICD-10-CM

## 2011-03-04 DIAGNOSIS — F411 Generalized anxiety disorder: Secondary | ICD-10-CM | POA: Diagnosis present

## 2011-03-04 LAB — COMPREHENSIVE METABOLIC PANEL
ALT: 21 U/L (ref 0–35)
AST: 28 U/L (ref 0–37)
Alkaline Phosphatase: 96 U/L (ref 39–117)
CO2: 26 mEq/L (ref 19–32)
Calcium: 9.4 mg/dL (ref 8.4–10.5)
Chloride: 101 mEq/L (ref 96–112)
GFR calc Af Amer: 59 mL/min — ABNORMAL LOW (ref >60)
GFR calc non Af Amer: 49 mL/min — ABNORMAL LOW (ref >60)
Glucose, Bld: 169 mg/dL — ABNORMAL HIGH (ref 70–99)
Potassium: 3.9 mEq/L (ref 3.5–5.1)
Sodium: 136 mEq/L (ref 135–145)

## 2011-03-04 LAB — CBC
HCT: 40.8 % (ref 36.0–46.0)
Hemoglobin: 12.8 g/dL (ref 12.0–15.0)
RBC: 4.65 MIL/uL (ref 3.87–5.11)
WBC: 8.9 10*3/uL (ref 4.0–10.5)

## 2011-03-04 LAB — DIFFERENTIAL
Basophils Absolute: 0.1 10*3/uL (ref 0.0–0.1)
Basophils Relative: 1 % (ref 0–1)
Lymphs Abs: 1.3 10*3/uL (ref 0.7–4.0)
Monocytes Absolute: 0.4 10*3/uL (ref 0.1–1.0)

## 2011-03-04 LAB — BRAIN NATRIURETIC PEPTIDE: Pro B Natriuretic peptide (BNP): <30 pg/mL (ref 0.0–100.0)

## 2011-03-04 NOTE — Telephone Encounter (Signed)
Per CDY, pt really should go to UC or ED tonight to be evaluated.    Called, spoke with pt.  She sounded SOB over the phone and had audible wheezing.  Pt was informed CDY recs she go to the ED or UC tonight to be evaluated.  Pt was informed with these sxs it is in her best interest and this does not need to wait until tomorrow to be addressed.  She verbalized understanding of these instructions.

## 2011-03-04 NOTE — Telephone Encounter (Signed)
Called and spoke with pt.  Pt c/o increased sob with any amount of activity.  (Pt was having trouble breathing just while talking to me on the phone) Pt also c/o wheezing (which was audible over the phone) and coughing up thick brown sputum.  Symptoms started over the weekend. Pt is currently on 10mg  of Prednsione.  Suggested pt go to urgent care or ER tonight but pt declined stating she does not like to go to the UC or ER.  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  Pt states she is willing to see anyone.  Will forward message to Cy to address.   Allergies: Dicofenac, Sulfa, PCN

## 2011-03-05 ENCOUNTER — Inpatient Hospital Stay (HOSPITAL_COMMUNITY): Payer: Medicare Other

## 2011-03-05 DIAGNOSIS — J441 Chronic obstructive pulmonary disease with (acute) exacerbation: Secondary | ICD-10-CM

## 2011-03-05 DIAGNOSIS — R0602 Shortness of breath: Secondary | ICD-10-CM

## 2011-03-05 DIAGNOSIS — J471 Bronchiectasis with (acute) exacerbation: Secondary | ICD-10-CM

## 2011-03-05 DIAGNOSIS — J45901 Unspecified asthma with (acute) exacerbation: Secondary | ICD-10-CM

## 2011-03-05 DIAGNOSIS — K5900 Constipation, unspecified: Secondary | ICD-10-CM

## 2011-03-05 DIAGNOSIS — K219 Gastro-esophageal reflux disease without esophagitis: Secondary | ICD-10-CM

## 2011-03-05 DIAGNOSIS — I10 Essential (primary) hypertension: Secondary | ICD-10-CM

## 2011-03-05 DIAGNOSIS — F411 Generalized anxiety disorder: Secondary | ICD-10-CM

## 2011-03-05 NOTE — H&P (Signed)
[name] [name], [name] [name]                  ACCOUNT NO.:  [id number]  MEDICAL RECORD NO.:  [id number]           PATIENT TYPE:  E  LOCATION:  WLED                         FACILITY:  [facility]  PHYSICIAN:  Vania Rea, M.D. DATE OF BIRTH:  January 25, 1941  DATE OF ADMISSION:  03/04/2011 DATE OF DISCHARGE:                             HISTORY & PHYSICAL   PRIMARY CARE PHYSICIAN:  Rosalyn Gess. Norins, MD  PULMONOLOGIST:  Rennis Chris. Young, MD, FCCP, FACP  CHIEF COMPLAINT:  Progressive shortness of breath.  HISTORY OF PRESENT ILLNESS:  This is a 71 year old African-American lady who comes to the emergency room complaining of progressive shortness of breath for some weeks now.  She reports that she has visited her pulmonologist.  She has had taper doses of prednisone.  She is on a course of Z-Pak but the shortness of breath persists and over the past 4 days it has been getting even worse, not helped at all by her home nebulizations and she has been persuaded by her daughter who is a critical care nurse, that she has to come into the emergency room.  In the emergency room, the patient received serial nebulizations as well as multiple doses of steroids without any relief from her wheezing and the hospitalist service was called to assist with management.  She denies any fever.  She does have a cough productive of brown sputum. She does have chest pains related to coughing.  She has nausea and vomiting related to her GERD.  She has no diarrhea.  She has no frequency nor dysuria.  PAST MEDICAL HISTORY: 1. COPD/asthma. 2. Severe allergies. 3. GERD. 4. Obstructive sleep apnea, on home CPAP. 5. Hypertension.  MEDICATIONS: 1. Patanase nasal spray 2 times daily in both nostrils. 2. Xyzal 5 mg daily as needed. 3. Symbicort 160/4.5 two puffs twice daily. 4. Albuterol 2 puffs every 6 hours as needed. 5. EpiPen as needed for severe allergic reaction. 6. Albuterol by nebulizer as needed. 7. Brovana by  nebulizer twice daily. 8. Xanax 0.5 mg every 6 hours as needed.  The patient says she     typically only takes 1/2 a tablet. 9. Restasis eyedrops as directed. 10.Coenzyme Q10 200 mg daily. 11.Nexium 40 mg twice daily. 12.HCTZ 25 mg daily. 13.Hydrocodone/acetaminophen 1 tablet 3 times daily as needed. 14.Singulair 10 mg daily. 15.Patanol eye drops both eyes as directed 16.Crestor 5 mg Monday, Wednesday, Friday. 17.Spiriva 18 mcg inhalation daily. 18.Prednisone 5 mg 1 to 2 tablets daily.  ALLERGIES:  To PENICILLIN, NSAIDS, and SULFA.  SOCIAL HISTORY:  Discontinued tobacco in her 64s.  Denies alcohol or illicit drug use.  She is a retired Engineer, civil (consulting) with [employer].  FAMILY HISTORY:  Both parents lived well into their 90s and no chronic medical problems currently.  She used to live alone but currently she is living with her daughter who is her critical care nurse.  REVIEW OF SYSTEMS:  Other than noted above unremarkable.  PHYSICAL EXAMINATION:  GENERAL:  A very pleasant and very chatty but extremely dyspneic middle-aged African-American lady sitting up in the stretcher. VITAL SIGNS:  Temperature is  98.1, her pulse is 100, respirations 27, blood pressure 184/83.  She is saturating at 99% on 2 L. HEENT:  Her pupils are round and equal.  Mucous membranes are pink and anicteric.  No cervical lymphadenopathy.  No thyromegaly. CHEST:  Diffuse wheezing and rhonchi bilaterally. CARDIOVASCULAR SYSTEM:  She is tachycardiac.  No murmur. ABDOMEN:  Soft, nontender.  No masses. EXTREMITIES:  Without edema. CENTRAL NERVOUS SYSTEM: Cranial nerves II through XII are grossly intact.  She has no focal lateralizing signs.  LABORATORY DATA:  White count is 8.9, hemoglobin 12.8, platelets 271. She has a normal differential.  Her sodium is 136, potassium 3.9, chloride 101, CO2 of 26, glucose 169, BUN 18, creatinine 1.1.  Her calcium is 9.4.  Her liver functions are unremarkable.  Her  portable chest x-ray shows prominent interstitial markings, consider viral pneumonia or possible mild interstitial edema.  No effusion is seen.  ASSESSMENT: 1. Acute exacerbation of asthma, questionable status asthmaticus. 2. Gastroesophageal reflux disease. 3. Obstructive sleep apnea. 4. Hypertension. 5. Hyperlipidemia.  PLAN:  We will admit this lady to step-down for serial nebulizations. We will consult Pulmonary and Critical Care for assistance with management.  I have discussed code status with her and although her overwhelming desire is for a full no code because she does not want to be kept alive and suffering unnecessarily, she is willing to have temporary intubation if it will help her to get over a few days of bronchospasm.  Other plans will be as per orders.     Vania Rea, M.D.     LC/MEDQ  D:  03/04/2011  T:  03/04/2011  Job:  [id number]  cc:   Rosalyn Gess. Norins, MD [address] [place] [address]  Clinton D. Maple Hudson, MD, FCCP, FACP [facility] [address] [place] [address]  Electronically Signed by Vania Rea M.D. on 03/05/2011 06:39:04 PM

## 2011-03-06 LAB — BASIC METABOLIC PANEL
BUN: 18 mg/dL (ref 6–23)
Creatinine, Ser: 1.29 mg/dL — ABNORMAL HIGH (ref 0.4–1.2)
GFR calc non Af Amer: 41 mL/min — ABNORMAL LOW (ref >60)
Glucose, Bld: 148 mg/dL — ABNORMAL HIGH (ref 70–99)
Potassium: 4.1 mEq/L (ref 3.5–5.1)

## 2011-03-08 ENCOUNTER — Inpatient Hospital Stay (HOSPITAL_COMMUNITY): Payer: Medicare Other

## 2011-03-09 ENCOUNTER — Inpatient Hospital Stay (HOSPITAL_COMMUNITY): Payer: Medicare Other

## 2011-03-13 ENCOUNTER — Telehealth: Payer: Self-pay | Admitting: Internal Medicine

## 2011-03-13 NOTE — Telephone Encounter (Signed)
Per CDY-sure.

## 2011-03-13 NOTE — Telephone Encounter (Signed)
Will forward to MR for his okay.

## 2011-03-13 NOTE — Telephone Encounter (Signed)
[id number] x1 

## 2011-03-13 NOTE — Telephone Encounter (Signed)
[name] aware we will have to get this approved by both physicians. Pt wants to remain with CDY for allergies and switch to MR for her pulmonary issues. Will forward to CDY first and if he approves then we will forward to MR for his okay.

## 2011-03-13 NOTE — Telephone Encounter (Signed)
Patient phoned stated that she was returning a call to triage she can be reached at [phone] .[name] [name]
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
## 2011-03-15 NOTE — Telephone Encounter (Signed)
Fine with me. I stay for pulmonary. CDY for allergies

## 2011-03-15 NOTE — Telephone Encounter (Signed)
Spoke with pt's daughter and notified okay per both Dr's. HFU sched with MR for 04/05/11 at 10:15 am.

## 2011-03-17 NOTE — Discharge Summary (Signed)
NAMEROBERT, [name] [name]                  ACCOUNT NO.:  [id number]  MEDICAL RECORD NO.:  [id number]           PATIENT TYPE:  I  LOCATION:  1430                         FACILITY:  [facility]  PHYSICIAN:  Georgann Housekeeper, MD      DATE OF BIRTH:  04-11-1941  DATE OF ADMISSION:  03/04/2011 DATE OF DISCHARGE:  03/11/2011                              DISCHARGE SUMMARY   PRIMARY CARE DOCTOR:  Rosalyn Gess. Norins, M.D.  DISCHARGE DIAGNOSES: 1. Chronic obstructive pulmonary disease exacerbation/asthma     exacerbation. 2. Bronchiectasis, question pneumonia. 3. Gastroesophageal reflux disease. 4. Hypertension. 5. Constipation. 6. History of sleep apnea. 7. History of allergies. 8. Lung nodule, 6 mm, on CT scan.  MEDICATIONS ON DISCHARGE: 1. Avelox 400 mg daily for 5 more days. 2. Colace 100 mg b.i.d. 3. Hydrochlorothiazide 25 mg daily. 4. MiraLax 17 grams daily p.r.n. 5. Mucinex 600 mg b.i.d. 6. Norvasc 5 mg daily. 7. Prednisone taper 10 mg 3 tablets b.i.d. for 3 days, then 2 tablets     b.i.d. for 3 days, then 1 tablet b.i.d. for 3 days, then 1 tablet     daily for 3 days. 8. Nexium 40 mg daily. 9. Singulair 10 mg daily. 10.Symbicort 160/4.5 mcg 2 puffs b.i.d. 11.Ventolin nebulizer 2.5 mg q.6 h. p.r.n. 12.Ventolin MDI 2 puffs q.4 h. p.r.n. 13.Xanax 0.25 four times a day. 14.Robitussin AC 1 teaspoon q.h.s. p.r.n. 15.Senokot 1 tablet q.h.s. p.r.n. 16.Vitamin A and D ointment p.r.n. 17.Trazodone 25 mg 1-2 q.h.s. p.r.n.  LABORATORY DATA:  A 2-D echocardiogram showed LV function normal with EF of 65-70% with grade 1 diastolic dysfunction.  BNP less than 30.  Blood chemistries, sodium 138, potassium 4.1, creatinine 1.2, hemoglobin of 12.8, white count 8.9.  Swallowing study was negative.  Chest CT showed bronchiectasis with the area of mucus plugging, no acute infection, lung nodule 6 mm.  Recommend followup in 1 year.  Wrist and forearm x-ray negative for any fracture.  Blood pressure ranged  from 150-180 systolic/80-90 diastolic.  Room air sats 98% saturation, with ambulation 94-95%.  HOSPITAL COURSE:  In brief, 70 year old African American female with history of asthma/COPD with bronchiectasis admitted after failing the outpatient prednisone and Z-PAK, worsening shortness of breath and hypoxia, cough with mucus production. 1. Pulmonary.  The patient was admitted with broad-spectrum     antibiotics and nebulizer and Solu-Medrol with oxygen.  She had a     chest x-ray, showed questionable pneumonia and following that she     had CT of the chest, showed mucus plugging and bronchiectasis     changes.  No actual infectious etiology.  She also had incidental 6-     mm nodule on the lung.  As far as she was maintained on Solu-     Medrol, slowly tapered off to prednisone p.o. with clinical     improvement.  Continue on nebulizers.  She was switched to p.o.     Avelox, we will continue the full course.  As far as her oxygen,     remains much improved, and she was ambulating in the hallways  without the requirement of O2.  Still has some congestion and     cough.  Robitussin AC at nighttime helped her with her coughing and     sleep.  She will continue her prednisone taper and nebulizers at     home and pulmonary rehab with followup with Dr. Jetty Duhamel,     Allergies and Pulmonary. 2. As far as her history of significant allergies and asthma, continue     on her Symbicort and other medications as at home. 3. Hypertension.  The patient's blood pressure remained elevated in     spite of hydrochlorothiazide because of the Solu-Medrol and     steroids.  She reports that in the past she had problems     hypertension with steroids, Norvasc was added.  She will follow 5     mg along with hydrochlorothiazide.  She will follow up with     outpatient, Dr. Debby Bud, for blood pressure check. 4. Constipation.  The patient was started on MiraLax and Senokot with     stool softener with  good results.  She will do that at home p.r.n. 5. History of fall.  During the hospital course, she had some right     hand discomfort.  Wrist x-ray and forearm x-ray was done, which was     negative.  She had no more pain and swelling and no fractures.  No     other injury noted. 6. As far as GERD, continue on her Nexium.  She will have home health     services on discharge.  Follow up with Dr. Illene Regulus in 10     days and also follow up with Dr. Jetty Duhamel, Pulmonary and     Allergy.  DISCHARGE PLANNING AND TIME TAKEN:  Over 30 minutes.     Georgann Housekeeper, MD     KH/MEDQ  D:  03/11/2011  T:  03/11/2011  Job:  [id number]  cc:   Joni Fears D. Maple Hudson, MD, FCCP, FACP [facility]-Pulmonary Dept [address] [place] [address]  Rosalyn Gess. Norins, MD [address] [place] [address]  Electronically Signed by Georgann Housekeeper MD on 03/17/2011 11:28:08 PM

## 2011-03-18 ENCOUNTER — Telehealth: Payer: Self-pay | Admitting: *Deleted

## 2011-03-18 NOTE — Telephone Encounter (Signed)
K, Rx done

## 2011-03-18 NOTE — Telephone Encounter (Signed)
Patient requesting RX for "bath" chair.

## 2011-03-18 NOTE — Telephone Encounter (Signed)
Left vm for pt to stop by office and pick up request

## 2011-03-19 ENCOUNTER — Encounter: Payer: Self-pay | Admitting: Internal Medicine

## 2011-03-21 ENCOUNTER — Ambulatory Visit (INDEPENDENT_AMBULATORY_CARE_PROVIDER_SITE_OTHER): Payer: Medicare Other | Admitting: Internal Medicine

## 2011-03-21 ENCOUNTER — Encounter: Payer: Self-pay | Admitting: Internal Medicine

## 2011-03-21 VITALS — BP 130/72 | HR 74 | Temp 98.2°F | Wt 148.0 lb

## 2011-03-21 DIAGNOSIS — J4489 Other specified chronic obstructive pulmonary disease: Secondary | ICD-10-CM

## 2011-03-21 DIAGNOSIS — J449 Chronic obstructive pulmonary disease, unspecified: Secondary | ICD-10-CM

## 2011-03-21 NOTE — Patient Instructions (Addendum)
Pulmonary - doing very well today: lungs are very clear on exam. Plan - continue maintenance medications: Symbicort two puffs twice a day, singulair. Finish the prednisone as instructed at time of discharge. The albuterol inhaler or nebulizer is a rescue medication that is to be used for acute wheezing. The goal is to be able to get along without regular use of this medication. Plan - try to wean down the use of albuterol - go from 4 treatments a day to 3 treatments a day. If you do well on this schedule for 5 days try going to two treatments a day. If you continue to do well you may then use the albuterol every 4 hours ONLY as needed for acute wheezing. You can leave off the brovana - it is redundant/similar to albuterol. Leave off the spiriva.  For blood pressure continue to take the Norvasce and the hydrochlorothiazide.  For cholesterol - continue to take the crestor M-W-F: it is doing a good job of controlling your cholesterol levels.   For allergy - continue the xyzal. For stomach acid and reflux (a contributor to wheezing) continue the nexium.  Continue all your eye drops.  For anxiety continue the alprazolam as needed.
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
## 2011-03-24 DIAGNOSIS — J441 Chronic obstructive pulmonary disease with (acute) exacerbation: Secondary | ICD-10-CM

## 2011-03-24 DIAGNOSIS — I10 Essential (primary) hypertension: Secondary | ICD-10-CM

## 2011-03-24 DIAGNOSIS — J449 Chronic obstructive pulmonary disease, unspecified: Secondary | ICD-10-CM

## 2011-03-26 NOTE — Assessment & Plan Note (Signed)
[facility]                             PULMONARY OFFICE NOTE   NAME:[name], [name] MARRON                         MRN:          [id number]  DATE:03/10/2007                            DOB:          Jul 26, 1941    This is a very pleasant 70 year old [place] American female who follows  here for chronic obstructive pulmonary disease on the basis of asthmatic  bronchitis.  She presents today for followup.  She also has chronic  allergic rhinitis and chronic sinusitis.  The patient presents today for  followup.  She actually is doing very well since she has been on  prednisone for 9 days for bursitis, not for respiratory issues.  She has  been evaluated by Dr. Jetty Duhamel and she is being started on allergy  vaccine, based on her previous RAST studies. The patient also is  undergoing pulmonary rehabilitation and likes this.   CURRENT MEDICATIONS:  As noted on the intake sheet.  These have been  reviewed and are accurate.  Note is made that the patient is on a proton  pump inhibitor twice a day due to gastroesophageal reflux issues with  vocal cord dysfunction.   PHYSICAL EXAMINATION:  VITAL SIGNS:  Noted.  Oxygen saturation is 97% on  room air.  GENERAL:  This is an obese [place] American female who is in no acute  distress.  HEENT:  Examination is unremarkable.  NECK:  Is supple.  No adenopathy noted.  No JVD.  LUNGS:  Are clear to auscultation bilaterally.  CARDIAC:  Examination regular rate, rhythm.  No rubs, murmurs, gallops  heard.  EXTREMITIES:  The patient has no cyanosis, no clubbing, no edema noted.   IMPRESSION:  1. Asthma with chronic obstructive pulmonary disease component,      asthmatic bronchitis being the main issue.  2. Chronic rhinitis.  An IGE mediation to her issues.  3. Gastroesophageal reflux with vocal cord dysfunction.   PLAN:  1. Will be for the patient to continue her medications as they are.      She appears to be very well  compensated at present, but note was      made of course that she is on prednisone for non-respiratory issue      of bursitis.  2. Followup will be with Dr. Jetty Duhamel as previously established.      He is to start immune vaccine on the patient.  3. Dr. Maple Hudson will continue management of her respiratory issues, as I      will be leaving the practice.     Gailen Shelter, MD  Electronically Signed    CLG/MedQ  DD: 03/10/2007  DT: 03/10/2007  Job #: [id number]

## 2011-03-26 NOTE — Assessment & Plan Note (Signed)
[facility]                             PULMONARY OFFICE NOTE   NAME:Abbruzzese, AARA JACQUOT                         MRN:          [id number]  DATE:06/24/2007                            DOB:          Sep 06, 1941    PROBLEM:  1. Asthma with chronic obstructive pulmonary disease.  2. Chronic allergic rhinitis.  3. Esophageal reflux.  4. Lasix contact sensitivity.   PRIMARY PHYSICIAN:  Dr. Illene Regulus.   HISTORY:  They have replaced the carpet in her home with a lower pile  carpet and tried some dust abatement procedures.  She feels better off  of Xolair because she thinks that caused head congestion, even though it  did seem to help her chest.  She dropped off of pulmonary rehabilitation  because of bursitis problems in her right hip being treated by Dr.  Madelon Lips.  She decided not to start allergy vaccine as we had originally  expected in April because it was too hard for her to get back and forth  while her hip was a problem.  She required epidural steroids for her hip  pain.  Increased wheeze in damp weather.  She has now been on Z-Pak for  3 days.   MEDICATIONS:  1. Nexium 40 mg b.i.d.  2. Xopenex 1.25 mg by nebulizer.  3. Singulair 10 mg.  4. Spiriva.  5. Restasis eye drops.  6. Patanol eye drops.  7. Symbicort 160/4.5.  8. Veramyst.  9. Hydrochlorothiazide 25 mg.  10.Benicar 20 mg.  11.Clarinex 5 mg p.r.n.  12.Vicodin p.r.n.   DRUG INTOLERANCES:  PENICILLIN.   OBJECTIVE:  Weight 195 pounds.  BP 118/80, pulse regular at 61, room air  saturation 98%.  There is mild bilateral wheeze.  Heart sounds are  regular without murmur.  Breathing is unlabored.  There is no edema or  adenopathy.  Nasal airway is not obviously obstructed.   IMPRESSION:  Asthma with exacerbation of chronic obstructive pulmonary  disease and an atopic component.   PLAN:  1. We are going to wait on allergy vaccine as an outpatient.  2. Finish Z-Pak.  3. Prednisone 8-day  taper from 40 mg with careful steroid discussion      and questions answered.  4. Refill EpiPen to keep on hand.  5. Scheduled return in 3 months, earlier p.r.n.     Clinton D. Maple Hudson, MD, Tonny Bollman, FACP  Electronically Signed    CDY/MedQ  DD: 06/24/2007  DT: 06/26/2007  Job #: [id number]
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
## 2011-03-29 NOTE — Assessment & Plan Note (Signed)
[facility]                             PULMONARY OFFICE NOTE   NAME:WEBBLeva, Baine                         MRN:          [id number]  DATE:11/28/2006                            DOB:          08-05-1941    This is a very pleasant 70 year old female who follows here for:  1. Chronic difficult to control asthma.  2. Chronic rhinitis and sinusitis being managed by Dr. Pollyann Kennedy, ENT.  3. Obstructive sleep apnea very mild with an RDI of 17, intolerant of      CPAP.  4. Obesity.  5. Gastroesophageal reflux with laryngopharyngeal component being      managed by Dr. Russella Dar.   The patient presents today for followup. She has stopped Xolair due to  lack of improvement on this medication. This will be the second time  that she has tried Xolair without any improvement. She has been  evaluated by Dr. Pollyann Kennedy of ENT and he has recommended that she may need  sinus surgery due to extensive sinusitis. The patient continues to do  nasal hygiene for now. She has currently been switched to Veramyst. She  does have an element of vocal cord dysfunction in part due to  gastroesophageal reflux. This is being managed by Dr. Claudette Head. She  is to undergo what appears to be a pH probe testing in this regard. She  is also to start pulmonary rehab on the 29th of January. This apparently  has helped her in the past.   Dr. Pollyann Kennedy apparently has questioned whether the patient needs  immunotherapy for allergies. The patient had previously been tested by  Dr. Laurette Schimke but she did not want to return to Dr. Lucie Leather.  We  discussed this in detail and will probably refer to Dr. Maple Hudson.   The patient states that today she actually feels fairly well  compensated. She has some wheezing but this is baseline for her.   CURRENT MEDICATIONS:  1. Nexium twice a day.  2. Xopenex nebulized q.i.d. p.r.n.  3. Singulair 10 mg daily.  4. Spiriva 1 capsule inhaled daily.  5. Restasis eye drops as  instructed.  6. Patanol eye drops as instructed.  7. Symbicort 160/4.5 two inhalations twice a day.  8. Astelin 2 inhalations daily.  9. Veramyst 2 inhalations to each nostril daily.  10.Vicodin p.r.n.   PHYSICAL EXAMINATION:  VITAL SIGNS:  Noted oxygen saturations were 98%  on room air.  GENERAL:  This is an obese female who is in no acute distress.  HEENT:  Reveals rhinitis changes bilaterally.  NECK:  Supple, no adenopathy noted, no JVD.  LUNGS:  Clear to auscultation bilaterally. She has no true wheezes. She  does have pseudowheeze noted today.  CARDIAC:  Regular rate and rhythm, no rubs, murmurs or gallops heard.  EXTREMITIES:  The patient has no cyanosis, no clubbing and no edema  noted.   IMPRESSION:  1. Asthma difficult to control. The patient despite her pseudowheeze      today is actually fairly well compensated.  2. Vocal cord dysfunction due to gastroesophageal reflux  currently      being evaluated by gastroenterology.  3. Chronic sinusitis being evaluated by Dr. Pollyann Kennedy.  4. Query need for immunotherapy for allergies.   PLAN:  1. Refer the patient to Dr. Jetty Duhamel to determine whether she      needs further immunotherapy for allergies. She has failed Xolair      x2.  2. I encouraged the patient to attend pulmonary rehab and also to      engage in weight loss.  3. Continue other medications as they are currently.  4. Followup will be in 6-8 weeks time. She is to contact us prior to      that time should any problems arise.     Gailen Shelter, MD  Electronically Signed    CLG/MedQ  DD: 11/28/2006  DT: 11/28/2006  Job #: [id number]

## 2011-03-29 NOTE — H&P (Signed)
[facility]  Patient:    [name] [name], [name] [name] Visit Number: [id number] MRN: [id number]          Service Type: MED Location: [phone] Attending Physician:  Caleb Popp Dictated by:   Charlcie Cradle Delford Field, M.D. [facility] Admit Date:  11/02/2001                           History and Physical  CHIEF COMPLAINT: Respiratory distress.  HISTORY OF PRESENT ILLNESS: This is a 70 year old African-American female, with a history of asthmatic bronchitis and productive cough, admitted with increased respiratory distress and cough now productive of green/yellow mucus, increased wheezing, increased chest discomfort.  The patient is progressively worse and thus admitted for further inpatient care.  She is followed by Dr. Danice Goltz in the office setting.  PAST MEDICAL HISTORY:  1. Gastroesophageal reflux disease.  2. Hypertension.  No history of diabetes or myocardial infarction.  ALLERGIES: PENICILLIN.  PAST SURGICAL HISTORY:  1. Appendectomy.  2. T&A.  3. Tubal ligation.  CURRENT MEDICATIONS:  1. Zyrtec 10 mg q.d.  2. Reglan 10 mg a.c. and h.s.  3. Protonix 40 mg b.i.d.  4. Nebulizer therapy.  5. Bextra one q.d.  6. Paxil 10 mg q.d.  7. Hyzaar one q.d.  8. Nasonex two sprays each nostril q.d.  ALLERGIES: PENICILLIN.  PHYSICAL EXAMINATION:  VITAL SIGNS: TEMP 100 degrees, blood pressure 167/86, respirations 25, pulse 115.  GENERAL: Ill-appearing African-American female, in moderate respiratory distress.  CHEST: Inspiratory and expiratory wheeze with poor air movement.  CARDIAC: Resting tachycardia without S3.  Normal S1 and S2.  ABDOMEN: Soft, nontender.  Bowel sounds active.  EXTREMITIES: No edema or clubbing.  No venous disease.  NEUROLOGIC: Intact.  SKIN: Clear.  LABORATORY DATA: Sodium 142, potassium 3.1, chloride 107, CO2 25, BUN 8, creatinine 1.2.  Hemoglobin 16.  Blood sugar 117.  EKG, brief run of ventricular  tachycardia, normal sinus rhythm; nonspecific ST-T wave changes noted.  Chest x-ray showed no active disease.  IMPRESSION: Chronic obstructive pulmonary exacerbation with asthmatic bronchitic flare.  PLAN: Plan for this will be to give intravenous steroids, nebulizer treatments, oxygen administration, intravenous Rocephin.  Obtain sputum culture and sensitivities and Grams stain. Dictated by:   Charlcie Cradle Delford Field, M.D. [facility] Attending Physician:  Caleb Popp DD:  11/02/01 TD:  11/03/01 Job: [id number]

## 2011-03-29 NOTE — Assessment & Plan Note (Signed)
[facility]                             PULMONARY OFFICE NOTE   NAME:[name] [name], [name] [name]                         MRN:          [id number]  DATE:01/13/2007                            DOB:          11-Oct-1941    This is a very pleasant 70 year old [place] American female who follows  here for moderate persistent asthma.  She presents today for followup.  She has been evaluated by Dr. Jetty Duhamel for allergic component  aggravating her asthma.  The patient has numerous other issues to  include gastroesophageal reflux disease with vocal cord dysfunction,  chronic rhinitis and sinusitis, and obstructive sleep apnea, which is  very mild.  The patient is intolerant of CPAP, and uses nocturnal  oxygen.  She has obesity as well.   Patient presents today stating that she is actually feeling relatively  good, even though she is having some wheezing.  She is to be tried on  allergy vaccine by Dr. Jetty Duhamel.  She has no direct complaint  today.   CURRENT MEDICATIONS:  As noted on the intake sheet.  These have been  reviewed, and are accurate.   PHYSICAL EXAMINATION:  VITALS:  As noted.  Oxygen saturation is 99% on  room air.  GENERAL:  This is a well-developed, obese, African American female who  is in no acute distress.  HEENT:  Examination revealed some mild turbinate edema, otherwise  unremarkable.  NECK:  Supple.  No adenopathy.  No JVD.  LUNGS:  She has some faint end expiratory wheezes in the upper lung  zones, otherwise unremarkable.  She has no pseudo wheeze today.  CARDIAC EXAMINATION:  Regular rate and rhythm.  No rubs, murmurs or  gallops heard.  ABDOMEN:  Is benign, obese.  EXTREMITIES:  Patient has no cyanosis.  No clubbing.  No edema noted.   IMPRESSION:  1. Asthma with chronic obstructive pulmonary disease features.  2. Chronic allergic rhinosinusitis.  3. Gastroesophageal reflux.   PLAN:  Will be for the patient to continue her  medications as they are.  She is to continue followup with Dr. Jetty Duhamel.  We will see her  back in 4 to 6 weeks' time.  At that time, I will release her care fully  to the care Dr. Jetty Duhamel to continue following her from the asthma  and allergy standpoint, as I will be leaving the practice.     Gailen Shelter, MD  Electronically Signed    CLG/MedQ  DD: 01/13/2007  DT: 01/13/2007  Job #: [id number]

## 2011-03-29 NOTE — Procedures (Signed)
[name] [name], [name] [name]                  ACCOUNT NO.:  [id number]   MEDICAL RECORD NO.:  [id number]          PATIENT TYPE:  OUT   LOCATION:  SLEEP CENTER                 FACILITY:  [facility]   PHYSICIAN:  Marcelyn Bruins, M.D. [facility] DATE OF BIRTH:  04-11-1941   DATE OF STUDY:  12/05/2004                              NOCTURNAL POLYSOMNOGRAM   REFERRING PHYSICIAN:  LAURA GONZALEZ   INDICATION FOR STUDY:  Hypersomnia with sleep apnea.  Epworth score is 3.   SLEEP ARCHITECTURE:  Patient had a total sleep time of 302 minutes with  adequate slow wave sleep with decreased REM.  Sleep onset latency was very  prolonged at 70 minutes and REM onset was normal.  Sleep efficiency was only  71%.   IMPRESSION:  1.  Mild to moderate obstructive sleep apnea/hypopnea syndrome with a      respiratory disturbance index of 17 events per hour and O2 desaturation      as low as 82%. Events were not positional but they were clearly worse      during REM.  2.  Moderate snoring noted throughout the study.  3.  No clinically significant cardiac arrhythmias.  4.  Moderate numbers of leg jerks with very little sleep disruption.      KC/MEDQ  D:  12/12/2004 13:16:01  T:  12/12/2004 13:56:20  Job:  [id number]

## 2011-03-29 NOTE — Assessment & Plan Note (Signed)
[facility]                           GASTROENTEROLOGY OFFICE NOTE   NAME:WEBBClista, Rainford                         MRN:          [id number]  DATE:09/16/2006                            DOB:          [birth year]    Mrs. Stegner complains of frequent nausea that does not appear to be correlated  with any particular activity or meals.  She notes fleeting, sharp mid  sternal area chest pains that are not particularly correlated with any  activity or meal.  She has no dysphagia or odynophagia.  She states she  takes her Nexium regularly twice a day, but she also states that she does  skip other medications depending on how she feels.  Her last upper endoscopy  was in May of 2002 and showed only a small hiatal hernia.  Distal esophageal  biopsies were negative.   CURRENT MEDICATIONS:  Listed on the chart, updated, and reviewed.   MEDICATION ALLERGIES:  PENICILLIN.   EXAM:  No acute distress.  Weight 182, blood pressure 168/90, pulse 72 and regular.  CHEST:  Clear to auscultation bilaterally.  CARDIAC:  Regular rate and rhythm without murmurs appreciated.  ABDOMEN:  Soft and nontender with normoactive bowel sounds.   ASSESSMENT AND PLAN:  Intermittent nausea and chest pain, etiology not  clear.  I suspect the nausea is related to medications or sinus drainage and  musculoskeletal pain.  She does have sinus surgery scheduled next month.  Possible gastroesophageal reflux disease or gastritis.  If her symptoms do  not improve following surgery, she will return to Dr. Debby Bud to reassess her  medications and then follow up with me if her symptoms persist to consider  upper endoscopy.  Continue Nexium 40 mg p.o. b.i.d.     Judie Petit T. Russella Dar, MD, [facility]  Electronically Signed    MTS/MedQ  DD: 09/16/2006  DT: 09/16/2006  Job #: [id number]   cc:   Rosalyn Gess. Norins, MD  C. Danice Goltz, MD
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
## 2011-03-29 NOTE — Assessment & Plan Note (Signed)
[facility]                             PULMONARY OFFICE NOTE   NAME:[name] [name], [name] [name]                         MRN:          [id number]  DATE:12/22/2006                            DOB:          12/08/1940    PROBLEM:  Allergy consultation at the kind request of Dr. Jayme Cloud for  this 70 year old woman who describes an extensive allergy background,  for consideration of trial of allergy vaccine.   PRIMARY CARE PHYSICIAN:  Dr. Illene Regulus.   DERMATOLOGIST:  Dr. Doristine Section.   ENT:  Dr. Serena Colonel.   GASTROENTEROLOGIST:  Dr. Claudette Head.   HISTORY OF PRESENT ILLNESS:  This 70 year old woman has been followed  for asthma for many years, but apparently did not have it in childhood.  She is not very clear about origins and early years.  She had had very  broadly positive allergy skin testing by Dr. Lucie Leather, but never tried  allergy vaccine.  She had been on Xolair injections for the past 2  years, quitting 2 months ago.  She thought Xolair might have increased  her sinus congestion and headache, and has felt somewhat better off of  this.  Dr. Pollyann Kennedy had evaluated her ENT status, and had suggested that  she might need allergy immunotherapy.  She had seen Dr. Doristine Section for  an exematoid rash, treated topically with some improvement.  She  suggests that her skin improved when she quit Xolair as well.  She has  not had an anaphylactic episode.  Her asthma has not been seasonal.  Triggers have included wind, irritants, and house dust, as well as  animal exposure.  She describes benefitting from steroid therapy, except  that it causes mood changes, abdominal bloating, and general discomfort.  She describes a CT scan of the sinuses this fall with Dr. Pollyann Kennedy, after  which he offered sinus surgery.  She admits she was afraid to have the  surgery.   MEDICATIONS:  1. Nexium 40 mg b.i.d.  2. Xopenex nebulizer q.i.d.  3. Singulair 10 mg.  4. Spiriva.  5.  Restasis eye drops.  6. Patanol eye drops.  7. Symbicort 160/4.5.  8. Veramyst each nostril daily.  9. Vicodin 5/500 p.r.n. use.  10.Clarinex.   DRUG INTOLERANCE TO PENICILLIN.   REVIEW OF SYSTEMS:  Dyspnea with exertion and at rest, productive cough,  acid indigestion, which she says is bad.  She frequently wakes  coughing during the night, and has to sit up on the edge of the bed.  Nasal congestion with sneezing and itching.  Depression.  Joint  stiffness, no significant leg edema or adenopathy.  No bleeding.   PAST HISTORY:  She had participated in pulmonary rehabilitation through  [facility], and felt that she had completed that.  She is now  applying to participate in the program at [facility].  She has had  several episodes of pneumonia, and at least 1 pneumococcal vaccine dose.  She does not get flu vaccine any longer, because she said her last one  made her sick.  Some foods  may make her wheeze, but this is very  nonspecific.  She works with Dr. Russella Dar on heartburn with hiatal hernia.  Previously diagnosed sleep apnea.  She quit CPAP.  Chronic asthma,  failed Xolair.  Pulmonary function test in August of 2007 showed mild  obstruction and small airway flows where there was response to  bronchodilator.  Diffusion capacity was 88% of predicted.  Measured lung  volumes were unreliable due to leak.  Surgeries have included repair of  a cyst on the left hand, surgery on left knee, tubal ligation,  appendectomy.   FOOD INTOLERANCE:  CHOCOLATE, CITRUS, TOMATO, VERY SALTY FOODS.  CONTACT  DERMATITIS WITH LATEX.  GASTROINTESTINAL  IRRITATION FROM ASPIRIN.   SOCIAL HISTORY:  She smoked for a little while as a very young woman,  but not since.  She is divorced, living alone, and now on disability.  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  Her daughter is a Engineer, civil (consulting).   FAMILY HISTORY:  Her mother had seasonal rhinitis.  Son and daughter  had  childhood asthma.   ENVIRONMENTAL:  [place], and says it is  musty and mildew bothers her.  Rennie Plowman is old.  She has encasings on the  bedding, and hypoallergenic pillows, but the mattress is old.  No  feathers, no pets, no smokers.   OBJECTIVE:  Weight 188 pounds, BP 146/102, pulse regular 67, room air  saturation 99%.  GENERAL APPEARANCE:  Somewhat overweight, mildly anxious, with pressured  speech.  Many questions, but appropriate.  SKIN:  No rash.  ADENOPATHY:  None found.  HEENT:  Snorting and sniffing with obvious nasal congestion.  Periorbital edema.  No conjunctival injection.  Palate spacing 2/3.  There is no visible postnasal drainage or pharyngeal erythema.  I do not  see nasal polyps.  Neck veins are not distended.  No stridor.  CHEST:  Cough and wheeze with deep breath.  Unlabored during quiet  speech.  HEART:  Sounds regular, no murmur or gallop audible.  EXTREMITIES:  No cyanosis, clubbing, or edema.  No tremor.   RADIOLOGY:  Chest x-ray on April 17, 2006 showed no active cardiopulmonary  disease.  Both lungs clear.   IMPRESSION:  1. Allergic rhinitis.  2. Rhinosinusitis.  3. Asthma with chronic obstructive pulmonary disease.  4. Food sensitivity to chocolate, citrus, salt, and tomato with      suspicion that the problem is that these are rather acid or      irritating.  5. Contact sensitivity to latex.  6. Significant gastroesophageal reflux disease.  Note that surgical      repair was recommended, and that Dr. Lucie Leather focused attention on      this problem.  She has declined surgical evaluation.  7. Significant atopy.  Note that last allergy testing on January 07, 2005 had given an IGE of 268 with significant elevations for dust      mite, grass pollens, tree pollens.  She had intense skin reaction      to previous standard skin testing at Dr. Kathyrn Lass office, and does     not want to repeat that at this time.  We  discussed her      medications, including bronchodilators and anti inflammatories, and      her experience with prednisone.  We discussed environmental      precautions, especially the importance of mold and dust  avoidance      in the home, and the option of finding a new home.  She expressed      interest in a trial of allergy vaccine which we discussed very      carefully, including the potential for anaphylaxis, which we      described.  I emphasized reflux precautions, which I think are      probably an important contributor.  We also discussed management of      her sinus disease, which likewise is apt to be an important      contributor to her asthma.   PLAN:  1. Nebulizer treatment today, Xopenex 1.25 mg with Depo-Medrol 80 mg      IM and steroid talk.  2. We are going to repeat her last allergy blood testing instead of      doing allergy skin testing at this time.  3. We anticipate building an allergy vaccine trial based on her last      testing profile with injections to be administered here.  4. Schedule return with me in 1 month, earlier p.r.n.     Clinton D. Maple Hudson, MD, [facility], FACP  Electronically Signed    CDY/MedQ  DD: 12/22/2006  DT: 12/23/2006  Job #: [id number]   cc:   Gailen Shelter, MD  Jeannett Senior. Pollyann Kennedy, MD

## 2011-03-29 NOTE — Assessment & Plan Note (Signed)
[facility]                         GASTROENTEROLOGY OFFICE NOTE   NAME:[name], [name] IKEDA                         MRN:          [id number]  DATE:12/19/2006                            DOB:          08-Sep-1941    Return office visit for GERD.  She has intermittent flares of her reflux  symptoms, occasionally associated with vomiting.  She does feel her  symptoms are under good control.  No dysphagia, odynophagia, change in  bowel habits, melena, hematochezia or abdominal pain.   CURRENT MEDICATIONS:  Listed on the chart, updated and reviewed.   MEDICATION ALLERGIES:  PENICILLIN.   EXAM:  Overweight, no acute distress.  Weight 185.4 pounds.  Blood  pressure is 128/74, pulse 64 and regular.  CHEST:  End expiratory wheezes with occasional scattered rhonchi.  CARDIAC:  Regular rate and rhythm without murmurs.  ABDOMEN:  Soft and nontender with normoactive bowel sounds.   ASSESSMENT AND PLAN:  1. Gastroesophageal reflux disease.  Symptoms under reasonably good      control.  She is given all standard literature on lifestyle and      dietary measures to improve control of GERD.  Renew Nexium 40 mg      p.o. b.i.d. for one year.  Return office visit one year.  2. History of hyperplastic colon polyps.  Her recall is recommended      for April 2014.     Venita Lick. Russella Dar, MD, [facility]  Electronically Signed    MTS/MedQ  DD: 12/19/2006  DT: 12/19/2006  Job #: [id number]

## 2011-03-29 NOTE — Assessment & Plan Note (Signed)
[facility]                               PULMONARY OFFICE NOTE   NAME:WEBBJahniya, Duzan                         MRN:          [id number]  DATE:06/24/2006                            DOB:          [birth year]    [name] [name] is a 70 year old African-American female who follows here for asthma,  chronic sinusitis, a huge component of vocal cord dysfunction.  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  She has obstructed sleep apnea and  refuses to wear a CPAP and has issues with gastroesophageal reflux and  laryngopharyngeal reflux.  Patient presents today  for follow up.  Her main  issues have been difficulties with the high ozone alert days.  She also  has had difficulties because of the heat.  She denies any fevers, chills or  sweats.  She has had no sputum production.  She does have a chronic cough  and chronic hoarseness with no changes in this quality.  She did start  increased dose of Xolair today at 300 mg per month.   CURRENT MEDICATIONS:  Are as on the intake sheet.  These have been reviewed  and are accurate.   PHYSICAL EXAMINATION:  GENERAL:  Reveals morbidly obese African-American  female who is in no acute distress.  HEENT:  Reveals dentures.  The patient has turbine edema bilaterally with  clear nasal discharge.  Pharynx is clear with post nasal drip noted.  NECK:  Supple.  She does have marked pseudo-wheeze.  CARDIAC:  Regular rate and rhythm.  No rubs or gallops heard.  LUNGS:  She does have element of true wheeze.  ABDOMEN:  Obese otherwise benign.  EXTREMITIES:  No cyanosis, no clubbing, no edema.   IMPRESSION:  1. Asthma with acute flare, likely secondary to environmental factors.      The patient however does have known IgE mediated disease.   PLAN:  Will be for the patient to continue Xolair at the 300 mg per month.  This would increase dose for the patient  1. Continue other medications as they are.  2. Because of her  bronchospasm today and poor compensation we did give her      Xopenex 1. to 5 mg and nebulize x1.  This helped the patient with      clearing bronchospasm and improve airway movement bilaterally.  3. Follow up will be in six to eight weeks time.  She will contact us      prior to that time if she has any new problems arise.                                  Gailen Shelter, MD   CLG/MedQ  DD:  06/24/2006  DT:  06/25/2006  Job #:  [id number]

## 2011-03-29 NOTE — Assessment & Plan Note (Signed)
[facility]                               PULMONARY OFFICE NOTE   NAME:Boling, DREONNA HUSSEIN                         MRN:          [id number]  DATE:09/08/2006                            DOB:          1941-09-24    This is a 70 year old African-American female who follows here for asthmatic  bronchitis and chronic sinusitis.  She saw Dr. Pollyann Kennedy of ENT on September  26th.  She was noted to have acute-on-chronic sinusitis and was placed on  antibiotics.  She states that Dr. Pollyann Kennedy believes that she may need  endoscopic sinus surgery, and I tend to agree with this. She wants to give  another trial of CPAP; however, I would like for her to have her sinus  issues resolved before we try this.  She also states during her last Xolair  therapy she required the use of EpiPen, felt that she was closing in.  I  believe at this point, since she really has not had major improvement with  the Xolair, that we will discontinue this medication.   We did offer today flu vaccine.  Patient states, I don't not take flu  vaccines.   CURRENT MEDICATIONS:  As noted on the intake sheet. These have been reviewed  and are accurate.   PHYSICAL EXAMINATION:  VITAL SIGNS:  Noted.  Oxygen saturation is 100% on  room air.  GENERAL:  This is an obese African-American female who is in no acute  distress.  HEENT:  Turbinate edema is noted to be markedly reduced from prior  examination.  NECK:  She does have prominent pseudowheeze on neck exam.  LUNGS:  Clear.  CARDIAC:  Regular rate and rhythm.  No murmurs, rubs or gallops.  EXTREMITIES:  Patient has no clubbing, cyanosis or edema noted.   IMPRESSION:  1. Asthmatic bronchitis:  Patient is actually fairly well compensated.  2. Chronic sinusitis:  The patient may very well require endoscopic sinus      surgery, and I agree with this.  The patient should have only mild-to-      moderate risks from pulmonary complications from the same.  3.  Vocal cord dysfunction due to the above-named factors.   PLAN:  1. The patient is to continue her medications as they are.  2. We will discontinue Xolair, given that she is starting to become      symptomatic with this treatment and had to use EpiPen during her last      visit.  3. We will re-evaluate the use of CPAP once her sinus issues are resolved.  4. Follow up will be in 4-6 weeks time.  She is to contact us prior to      that time should any problems arise.     Gailen Shelter, MD  Electronically Signed    CLG/MedQ  DD: 09/12/2006  DT: 09/12/2006  Job #: [id number]   cc:   Jeannett Senior. Pollyann Kennedy, MD

## 2011-03-29 NOTE — Assessment & Plan Note (Signed)
[facility]                             PULMONARY OFFICE NOTE   NAME:[name] [name], [name] [name]                         MRN:          [id number]  DATE:02/11/2007                            DOB:          12/19/1940    PROBLEMS:  1. Asthma with chronic obstructive pulmonary disease.  2. Chronic allergic rhinitis.  3. Esophageal reflux.  4. Latex contact sensitivity.   HISTORY:  She says that she likes the pulmonary rehabilitation program  at [facility] and is continuing with that. She had to use her nebulizer  machine an hour ago and says that her breathing varies from one day to  the next with trigger factors including weather, but also she thinks the  current spring pollens. She is having some nasal congestion.   MEDICATIONS:  1. Nexium 40 mg b.i.d.  2. Xopenex by nebulizer daily p.r.n.  3. Singulair 10 mg.  4. Spiriva once daily.  5. Restasis eye drops.  6. Patanol eye drops.  7. Symbicort 160/4.5 2 puffs b.i.d.  8. Veramyst nasal spray.  9. Hydrochlorothiazide 25 mg.  10.Benicar 20 mg.  11.Clarinex p.r.n.  12.Occasional Vicodin.  Drug intolerant of PENICILLIN.   OBJECTIVE:  Weight 188 pounds, blood pressure 118/74, pulse regular 68,  room air saturation 100%. There is mild bilateral wheeze, unlabored.  Heart sounds are regular without murmur. No neck vein distension,  strider, or edema. Nose is wet, mildly congested, but without visible  drainage.   RAST TESTING:  She ended up getting this done twice on February 11 and  February 27 with results roughly paralleling each other indicating  significant reactions for dust mite, grass, as well as tree and weed  pollens. Total IGE was 239.   IMPRESSION:  Allergic rhinitis, atopic asthma with a chronic obstructive  pulmonary disease component.   PLAN:  We discussed allergy vaccine as a therapeutic strategy and  compared it to other measures emphasizing environmental precautions  where possible, inhaled  and systemic  steroids, antihistamines, bronchodilators, and other antiinflammatory  strategies. She wishes to  begin allergy vaccine and I will construct a vaccine based on her RAST  testing. Schedule return two months, earlier p.r.n.     Clinton D. Maple Hudson, MD, [facility], FACP  Electronically Signed    CDY/MedQ  DD: 02/14/2007  DT: 02/15/2007  Job #: [id number]

## 2011-03-29 NOTE — Assessment & Plan Note (Signed)
[facility]                             PULMONARY OFFICE NOTE   NAME:[name] [name], [name]                         MRN:          [id number]  DATE:10/31/2006                            DOB:          Apr 16, 1941    HISTORY OF PRESENT ILLNESS:  The patient is a 70 year old African-  American female patient of Dr. Jayme Cloud who has a known history of  asthmatic bronchitis, chronic sinusitis, and vocal cord dysfunction.  Patient presents for the history of nasal congestion productive of thick  yellow sputum and wheezing. Patient denies any hemoptysis, orthopnea,  PND, or leg swelling.   PAST MEDICAL HISTORY:  Reviewed.   MEDICATIONS:  Reviewed.   PHYSICAL EXAMINATION:  GENERAL: The patient is a pleasant female in no  acute distress.  VITAL SIGNS: She is afebrile with stable vital signs, O2 saturation is  99% on room air.  HEENT:  Nasal mucosa with some mild turbinate edema. Posterior pharynx  is clear.  NECK: Supple without adenopathy.  LUNGS: Reveal coarse breath sounds with a few expiratory wheezes.  Patient has upper airway pseudo wheezing.  CARDIAC: Regular rate.  ABDOMEN: Soft and nontender.  EXTREMITIES: Are warm without edema.   IMPRESSION AND PLAN:  1. Acute flare of asthmatic bronchitis and vocal cord dysfunction. The      patient is given Doxycycline x7 days. Mucinex DM twice daily. Endal      HD #8 ounces 1-2 tsp. q.4-6 hours p.r.n. cough. A short prednisone      taper over the next week. The patient is to return with Dr.      Jayme Cloud in 1 month or sooner if needed.      [name] Oaks, NP  Electronically Signed      [name] Shelter, MD  Electronically Signed   TP/MedQ  DD: 10/31/2006  DT: 10/31/2006  Job #: [id number]
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
## 2011-03-29 NOTE — Discharge Summary (Signed)
[place]  Patient:    LINCOLN, KLEINER Visit Number: [id number] MRN: [id number]          Service Type: MED Location: [phone] Attending Physician:  Caleb Popp Dictated by:   Earley Favor, RN, MSN, ACNP Admit Date:  11/02/2001 Discharge Date: 11/06/2001                             Discharge Summary  DATE OF BIRTH:  02-Jul-1941.  DISCHARGE DIAGNOSES: 1. Acute exacerbation asthmatic bronchitis. 2. Hyperkalemia.  HISTORY OF PRESENT ILLNESS:  The patient is a 70 year old African-American female patient of Dr. Danice Goltz with a history of asthmatic bronchitis and productive cough admitted with increasing respiratory distress and a cough now productive of green yellow mucous.  She notes increased wheezing and increased chest discomfort.  She has been progressively worse and despite being treated on an outpatient basis she is being admitted for further evaluation and treatment.  LABORATORY DATA:  Sodium 139, potassium 3.8, chloride 192, CO2 24, glucose 188, BUN 12, creatinine 1.1, calcium 9.1, arterial blood gases on 2 liter nasal cannula pH 7.50, pCO2 23, pO2 76.  WBCs 14.9, hemoglobin 15.0, hematocrit 47.0, platelets 205.  Potassium low and was noted to be on November 04, 2001, as 3.0.  AST is 45, blood cultures are negative x 2.  Urine shows a few bacteria.  RADIOGRAPHIC DATA:  Portable chest x-ray shows no acute cardiopulmonary findings.  A 12-lead EKG demonstrates normal sinus rhythm with frequent and consecutive PVCs infusion complexes.  HOSPITAL COURSE: #1 - ACUTE EXACERBATION OF CHRONIC OBSTRUCTIVE PULMONARY DISEASE WITH ASTHMATIC BRONCHITIC FLAIR:  The patient was admitted into [facility] where she received ______ IV steroids, IV antibiotics along with nebulizer bronchodilators.  She reached maximum hospital benefit by November 06, 2001, with 02 saturations 94% on room air.  She received 3 days of IV  antimicrobial therapy and was changed to Ceftin 250 mg b.i.d.  #2 - HYPERKALEMIA:  Potassium was noted to be 3.0 on admission.  It was repleted with appropriate doses of K-Dur 20 mEq.  She responded well and her potassium returned to a normal level.  MEDICATIONS: 1. Ceftin 250 mg b.i.d. 2. Hyzaar 50 mg 1 q.d. 3. Albuterol and Atrovent nebulizers 2.5 and 0.5 respectively q.i.d. 4. Protonix 40 mg b.i.d. 5. Zyrtec 10 mg 1 q.h.s. 6. Paxil 20 mg q.d. 7. Reglan 10 mg q.d. a.c. and h.s. 8. Robitussin DM cough syrup 1 teaspoon b.i.d. 9. Prednisone on taper 40 mg for 3 days, 30 mg for 3 days, 20 mg for 3 days,    10 mg for 3 days, and stop.  DIET:  No fat, no concentrated sweets.  SPECIAL INSTRUCTIONS:  She is to call for any problems.  She has a follow up appointment with Nurse Practitioner Minor, November 12, 2001, at 2:45 p.m.  DISPOSITION/ CONDITION ON DISCHARGE:  Asthmatic bronchitis flair and acute exacerbation of chronic obstructive pulmonary disease has resolved.  She is to return to her normal pulmonary baseline.  Her depression is being addressed with Paxil.  Her current gastroesophageal reflux disease is being addressed with b.i.d. protonix along with Reglan and she is being discharged home in improved condition. Dictated by:   Earley Favor, RN, MSN, ACNP Attending Physician:  Caleb Popp DD:  11/06/01 TD:  11/06/01 Job: [id number]

## 2011-03-29 NOTE — Assessment & Plan Note (Signed)
[facility]                               PULMONARY OFFICE NOTE   NAME:[name] [name], [name] [name]                         MRN:          [id number]  DATE:08/01/2006                            DOB:          11/17/1940    This is a 70 year old African-American female with asthmatic bronchitis and  chronic sinusitis who presents for evaluation of increased nasal congestion  for the last two days.  She also has noted increased chest congestion,  increase in subjective wheezing, coughing, and sputum production, yellowish  to greenish.  She also has noted a sore throat.  She is currently on  Neurontin for shingles.  She also is wanting to give CPAP for obstructive  sleep apnea another try but states that with her nasal congestion, she could  not tolerate it.  The patient denies any fevers, chills, or sweats.   CURRENT MEDICATIONS:  As noted on the intake sheet, these have been reviewed  and are accurate.   PHYSICAL EXAMINATION:  VITAL SIGNS:  Noted.  Oxygen saturation is 100% on  room air.  GENERAL:  This is an obese African-American female who is in no acute  distress.  She does constant throat-clearing.  She has a slightly hoarse  voice.  HEENT:  Remarkable for nasal turbinate edema, erythematous pharynx without  exudates.  NECK:  Supple.  No adenopathy noted.  No JVD.  LUNGS:  She has rhonchi throughout.  CARDIAC:  Regular rate and rhythm.  No murmurs, rubs or gallops.  EXTREMITIES:  Patient has no cyanosis.  No clubbing and no edema.   IMPRESSION:  1. Acute exacerbation of chronic rhinitis and possibly chronic sinusitis      as well.  2. Asthmatic bronchitis with mild exacerbation secondary to the above.  3. Dyspnea, multifactorial.  4. Obstructive sleep apnea.  Patient is intolerant of CPAP.   PLAN:  1. We will treat the patient's nasal turbinate edema and rhinitis changes      with Veramyst 1 inhalation to each nostril daily.  2. Will refer her to ENT,  given the chronicity of her problems is may      require surgical intervention in the form of endoscopic sinus surgery.  3. At the patient's request, we will refer her to pulmonary rehab.  4. Followup will be in 2-4 weeks time.  She is contact us prior to that      time should any problems arise.  She is to see Dr. Pollyann Kennedy on the 26th of      September; therefore, we will defer any antibiotic therapy until she is      evaluated with regards to her sinusitis.     Gailen Shelter, MD  Electronically Signed   CLG/MedQ  DD: 09/12/2006  DT: 09/12/2006  Job #: [id number]
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
## 2011-04-01 ENCOUNTER — Encounter: Payer: Self-pay | Admitting: Internal Medicine

## 2011-04-02 ENCOUNTER — Telehealth: Payer: Self-pay | Admitting: *Deleted

## 2011-04-02 NOTE — Telephone Encounter (Signed)
LEft detailed vm for pharmacy

## 2011-04-02 NOTE — Telephone Encounter (Signed)
Pt states she needs brand ONLY for xanax and norvasc. Need to call [facility] [address] and change rx's to brand medically necessary

## 2011-04-05 ENCOUNTER — Inpatient Hospital Stay: Payer: Medicare Other | Admitting: Internal Medicine

## 2011-04-05 ENCOUNTER — Other Ambulatory Visit: Payer: Self-pay | Admitting: Internal Medicine

## 2011-04-05 ENCOUNTER — Encounter: Payer: Self-pay | Admitting: Internal Medicine

## 2011-04-05 ENCOUNTER — Other Ambulatory Visit: Payer: Medicare Other

## 2011-04-05 ENCOUNTER — Ambulatory Visit (INDEPENDENT_AMBULATORY_CARE_PROVIDER_SITE_OTHER): Payer: Medicare Other | Admitting: Internal Medicine

## 2011-04-05 ENCOUNTER — Telehealth: Payer: Self-pay | Admitting: Internal Medicine

## 2011-04-05 VITALS — BP 140/80 | HR 93 | Temp 98.6°F | Ht 63.5 in | Wt 148.0 lb

## 2011-04-05 DIAGNOSIS — J449 Chronic obstructive pulmonary disease, unspecified: Secondary | ICD-10-CM

## 2011-04-05 DIAGNOSIS — J4489 Other specified chronic obstructive pulmonary disease: Secondary | ICD-10-CM

## 2011-04-05 NOTE — Telephone Encounter (Signed)
Spoke with pt.  She states that she has been receiving [facility]care from [facility] since d/c'ed from the hospital- PT and OT.  She does not feel this is needed and wants to know if MR will be okay with discontinuing this. Pls advise thanks!

## 2011-04-05 NOTE — Telephone Encounter (Signed)
k she can dc. She needs to ultimately go back to pulm rehab

## 2011-04-05 NOTE — Progress Notes (Signed)
Subjective:    Patient ID: [name] [name], female    DOB: [birth year], 70 y.o.   MRN: [id number]  HPI  DATE OF ADMISSION:  03/04/2011 . DATE OF DISCHARGE:  03/11/2011    1. Chronic obstructive pulmonary disease exacerbation/asthma  exacerbation (hx of < 5 pack smoking quit 1970, frequently prednisone dependent, significant anxiety component and mood changes with steroids  2. Bronchiectasis, question pneumonia.   3. Gastroesophageal reflux disease with hx of UGI scope showing Hiatal hernia.   5. Constipation.   6. History of sleep apnea.- non compliant with CPAP   7. History of allergies.   8. Lung nodule, 6 mm, on CT scan. - April 2012. Possibly calcified. RUL (no prior for comparison but a 07/17/2000 CT chest reports simillar size nodule in similar location) 9.  Allergic rhinitis and allergic asthma.  She has  allergy skin test positive and was started on allergy vaccine in 2009 but stopped due to transport issues  10. Baseline ASthna   - several years  - allergies, gerd, sinus exacerbators  - 2009 trialled allergy shots  - 2004 per hx trialled xolair - effective but developed rash which she attributes to twice daily dosing  -  PFT 11/06/2010 - Mixed obstruction - restriction - fev1 1L/50%, Ratio 54, 24% BD response, TLC 3.6/78%, DLCO 16/75%  - frequent prednisone -> daily prednisone since FEb 20112   OV 04/05/2011: She was admitted for above. Now following up for same. Overall better. Does not want brovana. Wants to go back on xopenex (tremors with albuterol). Slowly tapering her daily prednisone. Emotional lability with prednisone +. Does not want to see Dr Maple Hudson anymore. Switching to Dr. Marchelle Gearing from now on. OVerall asthma stable currently   Review of Systems  Constitutional: Negative for fever and unexpected weight change.  HENT: Negative for ear pain, nosebleeds, congestion, sore throat, rhinorrhea, sneezing, trouble swallowing, dental problem, postnasal drip and sinus pressure.     Eyes: Negative for redness and itching.  Respiratory: Negative for cough, chest tightness, shortness of breath and wheezing.   Cardiovascular: Negative for palpitations and leg swelling.  Gastrointestinal: Negative for nausea and vomiting.  Genitourinary: Negative for dysuria.  Musculoskeletal: Negative for joint swelling.  Skin: Negative for rash.  Neurological: Negative for headaches.  Hematological: Does not bruise/bleed easily.  Psychiatric/Behavioral: Negative for dysphoric mood. The patient is not nervous/anxious.        Objective:   Physical Exam  [vitalsreviewed. Constitutional: She is oriented to person, place, and time. She appears well-developed and well-nourished. No distress.       obese  HENT:  Head: Normocephalic and atraumatic.  Right Ear: External ear normal.  Left Ear: External ear normal.  Mouth/Throat: Oropharynx is clear and moist. No oropharyngeal exudate.  Eyes: Conjunctivae and EOM are normal. Pupils are equal, round, and reactive to light. Right eye exhibits no discharge. Left eye exhibits no discharge. No scleral icterus.  Neck: Normal range of motion. Neck supple. No JVD present. No tracheal deviation present. No thyromegaly present.  Cardiovascular: Normal rate, regular rhythm, normal heart sounds and intact distal pulses.  Exam reveals no gallop and no friction rub.   No murmur heard. Pulmonary/Chest: Effort normal and breath sounds normal. No respiratory distress. She has no wheezes. She has no rales. She exhibits no tenderness.  Abdominal: Soft. Bowel sounds are normal. She exhibits no distension and no mass. There is no tenderness. There is no rebound and no guarding.  Musculoskeletal: Normal range of motion. She  exhibits no edema and no tenderness.  Lymphadenopathy:    She has no cervical adenopathy.  Neurological: She is alert and oriented to person, place, and time. She has normal reflexes. No cranial nerve deficit. She exhibits normal muscle tone.  Coordination normal.  Skin: Skin is warm and dry. No rash noted. She is not diaphoretic. No erythema. No pallor.  Psychiatric: Judgment and thought content normal.       Emotionally labile          Assessment & Plan:

## 2011-04-05 NOTE — Telephone Encounter (Signed)
Spoke with pt and notified okay to d/c this. Order was sent to [facility].  Pt states she does want to attend pulm rehab and this is already in process of being set up.

## 2011-04-05 NOTE — Assessment & Plan Note (Addendum)
Please give blood work for IgE today Please take sample symbicort with discount card Please have full PFT and see [name] our NP Please bring all your medications with you and see [name] our NP for med calendar [name] will work with you on stopping brovana and changing albuterol to xopenex [name] wilk work with you on getting you to a lower dose (1-2 mg) of prednisone After seeing [name] return to see me and I can look at [facility] for you (not at 2 times a month dose but lower dose) We will refer you to see Dr. Sidney Ace or Dr. Aris Georgia at [facility] for allergies (at your spl request) We are also referring you back to pulmonary rehb for asthma/copd  25 min face to face counseling

## 2011-04-05 NOTE — Patient Instructions (Addendum)
Please give blood work for IgE today Please take sample symbicort with discount card Please have full PFT and see [name] our NP Please bring all your medications with you and see [name] our NP for med calendar [name] will work with you on stopping brovana and changing albuterol to xopenex [name] wilk work with you on getting you to a lower dose (1-2 mg) of prednisone After seeing [name] return to see me and I can look at [facility] for you (not at 2 times a month dose but lower dose) We will refer you to see Dr. Sidney Ace or Dr. Aris Georgia at [facility] for allergies (at your spl request) We are also referring you back to pulmonary rehb for asthma/copd

## 2011-04-09 ENCOUNTER — Encounter: Payer: Self-pay | Admitting: *Deleted

## 2011-04-09 ENCOUNTER — Telehealth: Payer: Self-pay | Admitting: *Deleted

## 2011-04-09 NOTE — Telephone Encounter (Signed)
PA requested for Norvasc [brand name] Called [phone] and placed Prior Authorization Review request via phone [04/03/11] 48-72 Hr turn around time per insurance representative  Recvd notice stating that pt must have failed on two [2] non-formulary drugs on list OR 'letter of medical necessity' must be written for drug requested.  Wrote 'letter of medical necessity' [see Pt's chart 04/09/11], forwarded to MD for signature.

## 2011-04-10 ENCOUNTER — Ambulatory Visit (INDEPENDENT_AMBULATORY_CARE_PROVIDER_SITE_OTHER): Payer: Medicare Other | Admitting: Adult Health

## 2011-04-10 ENCOUNTER — Encounter: Payer: Self-pay | Admitting: Adult Health

## 2011-04-10 VITALS — BP 122/60 | HR 62 | Temp 97.8°F | Ht 63.5 in | Wt 173.8 lb

## 2011-04-10 DIAGNOSIS — J449 Chronic obstructive pulmonary disease, unspecified: Secondary | ICD-10-CM

## 2011-04-10 MED ORDER — LEVALBUTEROL TARTRATE 45 MCG/ACT IN AERO: 2.0000 | INHALATION_SPRAY | RESPIRATORY_TRACT | Status: DC | PRN

## 2011-04-10 MED ORDER — XOPENEX 0.63 MG/3ML IN NEBU: 1.0000 | INHALATION_SOLUTION | RESPIRATORY_TRACT | Status: DC | PRN

## 2011-04-10 MED ORDER — LEVOCETIRIZINE DIHYDROCHLORIDE 5 MG PO TABS: 5.0000 mg | ORAL_TABLET | Freq: Every day | ORAL | Status: DC

## 2011-04-10 NOTE — Patient Instructions (Signed)
Decrease Prednisone 5mg  1/2 daily  Follow med calendar closely and bring to each visit.  follow up for Lung function test as planned next week follow up Dr. Marchelle Gearing 3 weeks  Stop Brovana.  May change albuterol to xopenex inhaler and/or neb to be used As needed

## 2011-04-10 NOTE — Assessment & Plan Note (Addendum)
Improved control on present regimen.  To look at [facility] on return  pfts pending.  Decreased steroids.  Patient's medications were reviewed today and patient education was given. Computerized medication calendar was adjusted/completed   Plan:  Decrease Prednisone 5mg  1/2 daily  Follow med calendar closely and bring to each visit.  follow up for Lung function test as planned next week follow up Dr. Marchelle Gearing 3 weeks  Stop Brovana.  May change albuterol to xopenex inhaler and/or neb to be used As needed

## 2011-04-10 NOTE — Progress Notes (Signed)
Subjective:    Patient ID: [name] [name], female    DOB: [birth year], 70 y.o.   MRN: [id number]  HPI 70 yo WF with known hx of Asthma, COPD , Bronchiectasis, GERD, OSA-hx of CPAP noncompliance.  - hx of Lung nodule, 6 mm, on CT scan. - April 2012. Possibly calcified. RUL (no prior for comparison but a 07/17/2000 CT chest reports     simillar size nodule in similar location). - Hx of  Allergic rhinitis and allergic asthma.  She has  allergy skin test positive and was started on allergy vaccine in 2009 but stopped due     to transport issues - 2004 per hx trialled xolair - effective but developed rash which she attributes to twice dosing -  PFT 11/06/2010 - Mixed obstruction - restriction - fev1 1L/50%, Ratio 54, 24% BD response, TLC 3.6/78%, DLCO 16/75% - frequent prednisone -> daily prednisone since FEb 20112 (has hx emotional lability w/ steroids).    OV 04/05/2011: She was admitted 4/23-4/30/12 for AECOPD, Bronchiectasis for below  Now following up for same. Overall better. Does not want brovana. Wants to go back on xopenex (tremors with albuterol). Slowly tapering her daily prednisone. Emotional lability with prednisone . Does not want to see Dr Maple Hudson anymore. Switching to Dr. Marchelle Gearing from now on. OVerall asthma stable currently  04/10/11 Follow up and Med review Pt returns for follow up and med review. We reviewed all her meds and organized them into a med calendar.  Pt has stopped several off her meds on her own. Also did not bring several meds that she says she is taking on a regular basis.  We reviewed all changes and completed a med calendar as best we could.  Also changed several of her meds to brand name due her feeling that generic causes headaches.  We changed Albuterol to Xopenex. Per her request due to tremors/nervousness.  Last ov IGE >500 , she is to discuss restarting xolair on return with Dr. Marchelle Gearing  PFTs is scheduled for next week.   She is feeling much better. "Breathing is  best in a long time" . She is down to 5mg  daily of prednisone.    Review of Systems Constitutional:   No  weight loss, night sweats,  Fevers, chills, fatigue, or  lassitude.  HEENT:   No headaches,  Difficulty swallowing,  Tooth/dental problems, or  Sore throat,                No sneezing, itching, ear ache, nasal congestion, post nasal drip,   CV:  No chest pain,  Orthopnea, PND, swelling in lower extremities, anasarca, dizziness, palpitations, syncope.   GI  No heartburn, indigestion, abdominal pain, nausea, vomiting, diarrhea, change in bowel habits, loss of appetite, bloody stools.   Resp:  No excess mucus, no productive cough,  No non-productive cough,  No coughing up of blood.  No change in color of mucus.  No wheezing.  No chest wall deformity  Skin: no rash or lesions.  GU: no dysuria, change in color of urine, no urgency or frequency.  No flank pain, no hematuria   MS:  No joint pain or swelling.  No decreased range of motion.    Psych:  No change in mood or affect. .         Objective:   Physical Exam GEN: A/Ox3; pleasant , NAD, elderly.   HEENT:  [place]/AT,  EACs-clear, TMs-wnl, NOSE-clear, THROAT-clear, no lesions, no postnasal drip or exudate noted.  NECK:  Supple w/ fair ROM; no JVD; normal carotid impulses w/o bruits; no thyromegaly or nodules palpated; no lymphadenopathy.  RESP  Clear  P & A; w/o, wheezes/ rales/ or rhonchi.no accessory muscle use, no dullness to percussion  CARD:  RRR, no m/r/g  , no peripheral edema, pulses intact, no cyanosis or clubbing.  GI:   Soft & nt; nml bowel sounds; no organomegaly or masses detected.  Musco: Warm bil, no deformities or joint swelling noted.   Neuro: alert, no focal deficits noted.    Skin: Warm, no lesions or rashes           Assessment & Plan:

## 2011-04-10 NOTE — Telephone Encounter (Signed)
Signed Letter of Medical Necessity with Request for Redetermination [of an [employer] Prescription Drug Denial] form faxed to [facility] @ [phone] [04/09/11]

## 2011-04-11 ENCOUNTER — Other Ambulatory Visit: Payer: Self-pay | Admitting: *Deleted

## 2011-04-16 ENCOUNTER — Ambulatory Visit: Payer: Medicare Other | Admitting: Internal Medicine

## 2011-04-17 NOTE — Telephone Encounter (Signed)
Request for Additional Information for appeal process received [04/10/11] Will discuss w/MD

## 2011-04-17 NOTE — Telephone Encounter (Signed)
Received fax for "[employer]'s Favorable Decision on Your Redetermination (Appeal)" Brand Name Norvasc approval authorization received & faxed to pharmacy @ [phone] [facility] to inform Pt.

## 2011-04-18 ENCOUNTER — Ambulatory Visit (INDEPENDENT_AMBULATORY_CARE_PROVIDER_SITE_OTHER): Payer: Medicare Other | Admitting: Internal Medicine

## 2011-04-18 DIAGNOSIS — J449 Chronic obstructive pulmonary disease, unspecified: Secondary | ICD-10-CM

## 2011-04-18 NOTE — Progress Notes (Signed)
PFT done today. 

## 2011-04-22 ENCOUNTER — Ambulatory Visit (INDEPENDENT_AMBULATORY_CARE_PROVIDER_SITE_OTHER): Payer: Medicare Other | Admitting: Internal Medicine

## 2011-04-22 ENCOUNTER — Encounter: Payer: Self-pay | Admitting: Internal Medicine

## 2011-04-22 VITALS — BP 110/62 | HR 59 | Temp 97.9°F | Wt 172.0 lb

## 2011-04-22 DIAGNOSIS — J449 Chronic obstructive pulmonary disease, unspecified: Secondary | ICD-10-CM

## 2011-04-22 DIAGNOSIS — R071 Chest pain on breathing: Secondary | ICD-10-CM

## 2011-04-22 MED ORDER — HYDROCODONE-ACETAMINOPHEN 7.5-750 MG PO TABS: 1.0000 | ORAL_TABLET | Freq: Four times a day (QID) | ORAL | Status: DC | PRN

## 2011-04-22 MED ORDER — ALPRAZOLAM 0.5 MG PO TABS: 0.5000 mg | ORAL_TABLET | Freq: Four times a day (QID) | ORAL | Status: DC | PRN

## 2011-04-22 MED ORDER — XOPENEX 0.63 MG/3ML IN NEBU: 1.0000 | INHALATION_SOLUTION | RESPIRATORY_TRACT | Status: DC | PRN

## 2011-04-22 NOTE — Assessment & Plan Note (Signed)
Stable and doing much better than she has in the past. Very clear on exam.   Plan - Rx for xopenex nebs provided for routine home use.           Continue her present regimen

## 2011-04-22 NOTE — Assessment & Plan Note (Addendum)
Patient with no acute chest pain and she has been stable.   Plan - no further evaluation at this time           Continue risk factor modifications.

## 2011-04-22 NOTE — Progress Notes (Signed)
  Subjective:    Patient ID: [name] [name], female    DOB: August 27, 1941, 70 y.o.   MRN: [id number]  HPI Mrs. Coppin presents for follow-up. She is specifically asking that we call [facility] [phone] to stop their delivery. She therefore needs Rx for xopenex nebs. Needs Rx for hydrocodone refill. She needs a DXA with last study Sept 24, 2010. She has seen Dr. Dione Booze and she is for cataract extraction OD July 3rd. She has brought forms for FMLA leave for her son. She did have PFTs June 7th - report pending. She is scheduled to see Dr. Karlton Lemon at [facility].  PMH, FamHx and SocHx reviewed for any changes and relevance.   Review of Systems Review of Systems  Constitutional:  Negative for fever, chills, activity change and unexpected weight change.  HEENT:  Negative for hearing loss, ear pain, congestion, neck stiffness and postnasal drip. Negative for sore throat or swallowing problems. Negative for dental complaints.   Eyes: Negative for vision loss or change in visual acuity.  Respiratory: Negative for chest tightness and wheezing.   Cardiovascular: Negative for chest pain and palpitationNo decreased exercise tolerance Gastrointestinal: No change in bowel habit. No bloating or gas. No reflux or indigestion Genitourinary: Negative for urgency, frequency, flank pain and difficulty urinating.  Musculoskeletal: Negative for myalgias, back pain, arthralgias and gait problem.  Neurological: Negative for dizziness, tremors, weakness and headaches.  Hematological: Negative for adenopathy.  Psychiatric/Behavioral: Negative for behavioral problems and dysphoric mood.       Objective:   Physical Exam Vitals noted Gen'l - WNWD AA woman in no distress-actually doing very well. HEENT - nl Chest - good breath sounds, no wheezing, no increased work of breathing Cor - RRR       Assessment & Plan:  1. Shoulder pain - well preserved ROM and no click ort crepitus. Suspect simple  bursitis.  Plan - ROM exercise daily           OTC NSAIDs as needed.

## 2011-04-25 ENCOUNTER — Telehealth: Payer: Self-pay | Admitting: Internal Medicine

## 2011-04-25 NOTE — Telephone Encounter (Signed)
[name] [name], where you trying to reach pt yesterday?  I see you requested paper chart but do not see anything else after that.

## 2011-04-26 ENCOUNTER — Telehealth: Payer: Self-pay | Admitting: *Deleted

## 2011-04-26 MED ORDER — XOPENEX 0.63 MG/3ML IN NEBU: 1.0000 | INHALATION_SOLUTION | RESPIRATORY_TRACT | Status: DC | PRN

## 2011-04-26 MED ORDER — LEVALBUTEROL TARTRATE 45 MCG/ACT IN AERO: 2.0000 | INHALATION_SPRAY | RESPIRATORY_TRACT | Status: DC | PRN

## 2011-04-26 NOTE — Telephone Encounter (Signed)
Refill printed instead of sent electronically. Carron Curie, CMA

## 2011-04-26 NOTE — Telephone Encounter (Signed)
Spoke with the patient and she states that TP prescribed her xopenex for nebulizer but it was cancelled by Dr. Debby Bud nurse. She is requesting we resend the rx to [facility] on [address]. RX sent. Pt aware.Carron Curie, CMA

## 2011-05-01 ENCOUNTER — Telehealth: Payer: Self-pay | Admitting: *Deleted

## 2011-05-01 ENCOUNTER — Ambulatory Visit (INDEPENDENT_AMBULATORY_CARE_PROVIDER_SITE_OTHER): Payer: Medicare Other | Admitting: Internal Medicine

## 2011-05-01 ENCOUNTER — Encounter: Payer: Self-pay | Admitting: Internal Medicine

## 2011-05-01 VITALS — BP 112/70 | HR 55 | Temp 98.2°F | Ht 63.5 in | Wt 175.0 lb

## 2011-05-01 DIAGNOSIS — B4481 Allergic bronchopulmonary aspergillosis: Secondary | ICD-10-CM

## 2011-05-01 DIAGNOSIS — J984 Other disorders of lung: Secondary | ICD-10-CM

## 2011-05-01 DIAGNOSIS — R911 Solitary pulmonary nodule: Secondary | ICD-10-CM

## 2011-05-01 DIAGNOSIS — J4550 Severe persistent asthma, uncomplicated: Secondary | ICD-10-CM

## 2011-05-01 DIAGNOSIS — J45909 Unspecified asthma, uncomplicated: Secondary | ICD-10-CM

## 2011-05-01 NOTE — Telephone Encounter (Signed)
You can tell her that because her asthma is controlled, ok for surgery However , before surgery she needs to mention to doctor if asthma is acting up or feels it is controlled.  I will send my note to her eye doc

## 2011-05-01 NOTE — Telephone Encounter (Signed)
Pt forgot to ask MR about clearance for cataract surgery. She states this is set for 05-13-11. She states they are going to give her IV sedation. Pt wanted to know if MR had any recs for the surgery. Please advise. Carron Curie, CMA

## 2011-05-01 NOTE — Progress Notes (Signed)
Subjective:    Patient ID: [name] [name], female    DOB: 05-22-1941, 70 y.o.   MRN: [id number]  HPI  1.  Baseline Chronic High Risk (multiple admits), Allergic, Moderate-Severe Persistent Asthma   - Allergies, gerd, sinus exacerbators.   -  Frequent prednisone -> daily low dose prednisone since FEb 20112 -  PFT 11/06/2010 - Mixed obstruction - restriction - fev1 1L/50%, Ratio 54, 24% BD response, TLC 3.6/78%, DLCO 16/75% - May 2012: Switched asthma care to Dr. Marchelle Gearing    2. Bronchiectasis, (mild RUL) seen on CT chest - April 2012   3.  Allergic rhinitis and allergic asthma.    - 2000s: - per hx strongly skin test positive at Dr Lucie Leather office  - 2004 - trialed xolair - per hx effective but developed rash subjectively attributed to twice daily dosing (chart review 2004-2005 states it was stopped due to subjective lack of improvement and an incidental rash resolution was noted in retrospect after stopping xolair)  - 2009: IgE 306 in July 2009. RAST positive for various grass, oak, elm, ragweed, plantain, lamb etc., s/p Allergy shot trial with Dr Maple Hudson - stopped due to transport issues - 2012: May - IgE 588.  - 2012: June - moving allergy MD to Dr Aris Georgia  4. Lung nodule, 6 mm, on CT scan. - April 2012. Possibly calcified. RUL (no prior for comparison but a 07/17/2000 CT chest reports simillar size nodule in similar location)  5.  Gastroesophageal reflux disease with hx of UGI scope showing Hiatal hernia.     6. History of sleep apnea.- non compliant with CPAP   7. Significant anxiety (also mood lability hx with prednisone, worsening anxiety with albuterol)  8. Recurrent AE-ASthma -  Admitted 4/23-4/30/12  OV 05/01/2011: Returns for followup to review IgE and PFTs. Has finished med calendar visit with NP. Is off brovana. Is now on xopenex prn (albuterol  - tremors and anxiety). Contiues  Severe persistent regimen for asthma of Prednisone at 2.5mg  per day, singulair, symbicort, compliantly.  Take spiriva prn only. Also on cetrizine and PPI control. Feels the best in long time. Asthma is very well controlled subjectively. No nocturnal awakenings.  Used xopenex prn only 3 times in total in 3 weeks. Labs show IgE while on prednisone to be 588. PFts 04/18/2011 shows NORMALIZATION (fev1 1.8L/93%, ratio 68, 9% BD response, DLCO 86). She is awaiting to see Dr. Gary Fleet for allergy issues. She remains interested in retrial of xolair; review of chart shows she did not find it helped and post-dc of drug several months later she noticed some nonspecific skin findings to improve.   Past Medical History  Diagnosis Date  . Sleep apnea   . ALLERGIC RHINITIS   . Colon polyp, hyperplastic 02/2003  . Chronic rhinosinusitis   . Hyperlipidemia   . Acute bronchitis   . Routine general medical examination at health care facility   . Right hip pain   . Left knee pain   . Osteoporosis   . GERD (gastroesophageal reflux disease)   . Chronic obstructive asthma     PFT 11/06/10 - FEV1 1.24/ 0.62; FEV1/FVC 0.56, TLC 0.78; DLCO 0.75     Family History  Problem Relation Age of Onset  . Cancer Neg Hx     colon     History   Social History  . Marital Status: Divorced    Spouse Name: N/A    Number of Children: 2  . Years of Education: N/A  Occupational History  . disabled     [employer]   Social History Main Topics  . Smoking status: Former Smoker -- 3 years    Types: Cigarettes    Quit date: 11/11/1962  . Smokeless tobacco: Not on file  . Alcohol Use: No  . Drug Use: No  . Sexually Active: Not on file   Other Topics Concern  . Not on file   Social History Narrative   4 brothers4 sistersPt gets regular exerciseMoved in with daughter      Allergies  Allergen Reactions  . Diclofenac Sodium     REACTION: Hives  . Penicillins   . Sulfonamide Derivatives   . Albuterol Anxiety    Switched to xopenex     Outpatient Prescriptions Prior to Visit  Medication Sig Dispense  Refill  . ALPRAZolam (XANAX) 0.5 MG tablet Take 1 tablet (0.5 mg total) by mouth every 6 (six) hours as needed.  90 tablet  5  . amLODipine (NORVASC) 5 MG tablet Take 5 mg by mouth daily.        . budesonide-formoterol (SYMBICORT) 160-4.5 MCG/ACT inhaler Inhale 2 puffs into the lungs 2 (two) times daily. Rinse mouth  1 Inhaler  prn  . cycloSPORINE (RESTASIS) 0.05 % ophthalmic emulsion Place 2 drops into both eyes 2 (two) times daily.      Marland Kitchen esomeprazole (NEXIUM) 40 MG capsule Take 1 capsule (40 mg total) by mouth daily before breakfast.      . hydrochlorothiazide 25 MG tablet Take 25 mg by mouth daily.        Marland Kitchen HYDROcodone-acetaminophen (VICODIN ES) 7.5-750 MG per tablet Take 1 tablet by mouth every 6 (six) hours as needed.  100 tablet  2  . levalbuterol (XOPENEX HFA) 45 MCG/ACT inhaler Inhale 2 puffs into the lungs every 4 (four) hours as needed for wheezing.  1 Inhaler  2  . levocetirizine (XYZAL) 5 MG tablet Take 1 tablet (5 mg total) by mouth daily.  30 tablet  prn  . montelukast (SINGULAIR) 10 MG tablet Take 10 mg by mouth at bedtime.        Marland Kitchen olopatadine (PATANOL) 0.1 % ophthalmic solution Place 1 drop into both eyes daily.      . Olopatadine HCl (PATANASE) 0.6 % SOLN Place 2 sprays into the nose daily.      Marland Kitchen tiotropium (SPIRIVA) 18 MCG inhalation capsule Place 18 mcg into inhaler and inhale daily.        Pauline Aus 0.63 MG/3ML nebulizer solution Take 3 mLs (0.63 mg total) by nebulization every 4 (four) hours as needed for wheezing.  300 mL  12  . predniSONE (DELTASONE) 5 MG tablet 1-2 tabs daily for long term control of asthma.  100 tablet  1  . albuterol (PROAIR HFA) 108 (90 BASE) MCG/ACT inhaler Inhale 2 puffs into the lungs every 4 (four) hours as needed.      . levalbuterol (XOPENEX HFA) 45 MCG/ACT inhaler Inhale 2 puffs into the lungs every 4 (four) hours as needed for wheezing.  1 Inhaler  2      Review of Systems  Constitutional: Negative for fever and unexpected weight change.    HENT: Negative for ear pain, nosebleeds, congestion, sore throat, rhinorrhea, sneezing, trouble swallowing, dental problem, postnasal drip and sinus pressure.   Eyes: Negative for redness and itching.  Respiratory: Positive for shortness of breath. Negative for cough, chest tightness and wheezing.   Cardiovascular: Positive for chest pain. Negative for palpitations  and leg swelling.  Gastrointestinal: Negative for nausea and vomiting.  Genitourinary: Negative for dysuria.  Musculoskeletal: Negative for joint swelling.  Skin: Negative for rash.  Neurological: Negative for headaches.  Hematological: Does not bruise/bleed easily.  Psychiatric/Behavioral: Negative for dysphoric mood. The patient is not nervous/anxious.        Objective:   Physical Exam vitalsreviewed. Constitutional: She is oriented to person, place, and time. She appears well-developed and well-nourished. No distress.       obese  HENT:  Head: Normocephalic and atraumatic.  Right Ear: External ear normal.  Left Ear: External ear normal.  Mouth/Throat: Oropharynx is clear and moist. No oropharyngeal exudate.  Eyes: Conjunctivae and EOM are normal. Pupils are equal, round, and reactive to light. Right eye exhibits no discharge. Left eye exhibits no discharge. No scleral icterus.  Neck: Normal range of motion. Neck supple. No JVD present. No tracheal deviation present. No thyromegaly present.  Cardiovascular: Normal rate, regular rhythm, normal heart sounds and intact distal pulses.  Exam reveals no gallop and no friction rub.   No murmur heard. Pulmonary/Chest: Effort normal and breath sounds normal. No respiratory distress. She has no wheezes. She has no rales. She exhibits no tenderness.  Abdominal: Soft. Bowel sounds are normal. She exhibits no distension and no mass. There is no tenderness. There is no rebound and no guarding.  Musculoskeletal: Normal range of motion. She exhibits no edema and no tenderness.   Lymphadenopathy:    She has no cervical adenopathy.  Neurological: She is alert and oriented to person, place, and time. She has normal reflexes. No cranial nerve deficit. She exhibits normal muscle tone. Coordination normal.  Skin: Skin is warm and dry. No rash noted. She is not diaphoretic. No erythema. No pallor.  Psychiatric: Judgment and thought content normal.       Emotionally labile        Assessment & Plan:

## 2011-05-01 NOTE — Patient Instructions (Signed)
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 Gary Fleet allergy specialist Please sign release to get your skin test results from Dr Lucie Leather office I will research why  You had problems with xolair and depending on that restart at half dose Please give blood for aspergillus precipitin antibody to aspergillus fumigatus  and for specific serum IgE and IgG to A. Fumigatus - to help with abpa dx REturn in 2 months or sooner if there are problems

## 2011-05-02 NOTE — Telephone Encounter (Signed)
[id number].Jennifer Castillo, CMA  

## 2011-05-03 NOTE — Telephone Encounter (Signed)
Pt advised of recs for surgery.  Eye doc is Dr. Dione Booze. I will fax OV note to him once it is signed. Pt also aware that labs order has been placed. She will come on Monday for labs. Carron Curie, CMA

## 2011-05-03 NOTE — Telephone Encounter (Signed)
Pt returned call. [name] [name]  

## 2011-05-05 ENCOUNTER — Telehealth: Payer: Self-pay | Admitting: Internal Medicine

## 2011-05-05 DIAGNOSIS — R911 Solitary pulmonary nodule: Secondary | ICD-10-CM

## 2011-05-05 DIAGNOSIS — J4550 Severe persistent asthma, uncomplicated: Secondary | ICD-10-CM | POA: Insufficient documentation

## 2011-05-05 HISTORY — DX: Solitary pulmonary nodule: R91.1

## 2011-05-05 NOTE — Assessment & Plan Note (Addendum)
She is very well controlled on a severe persistent asthma regimen. She is iterested in [employer] re-trial She has significant allergy history, sinus history and mention of bronchiectasis on CT report (mild in my personal opinion) April 2012. Her IgE while on prednisone is 589. I therefore wonder if she has ABPA; this will explain the steroid sensitivity/responsivness as well.   PLAN Continue current severe persistent regimen for asthma Before, considering xolair - I will try to get skin test report from years ago from Dr Lucie Leather office. If this is not available, then will ask Dr. Gary Fleet to do aspergillus skin test Will check for aspergillus antibodies to help ABPA diagnosis IF indeed ABPA, then needs chronic daily prednisone (2.5mg  per day seems fine for her) and possibly an antifungal like itraconazole ROV 2 months   I have explained above to her and she agrees.

## 2011-05-05 NOTE — Assessment & Plan Note (Signed)
Lung nodule, 6 mm, on CT scan. - April 2012. Possibly calcified. RUL (no prior for comparison but a 07/17/2000 CT chest reports simillar size nodule in similar location). Plan repeat CT chest April 2013

## 2011-05-05 NOTE — Telephone Encounter (Signed)
[name] [name],  Please call Dr. Gary Fleet office and say that I am interested in an aspergillus skin test to diagnose ABPA - if possible or if patient allows. I am traveling so I am dependent on you to pass message. If she needs to talk to me, you can give my cell. I sent my note to her with above request in cover  Please ensure the blood test happens  Please get skin test reports from Dr. Lucie Leather office  Please tell patient that I will discuss xolair after visit with Dr. Gary Fleet, blood test results and review of skin test reports of Dr. Lucie Leather and her course of illness

## 2011-05-06 ENCOUNTER — Other Ambulatory Visit: Payer: Medicare Other

## 2011-05-06 DIAGNOSIS — B4481 Allergic bronchopulmonary aspergillosis: Secondary | ICD-10-CM

## 2011-05-09 ENCOUNTER — Telehealth: Payer: Self-pay | Admitting: Internal Medicine

## 2011-05-09 NOTE — Telephone Encounter (Signed)
I spoke with the pt and she states D.r whelan order some labs today and wanted to compare to see if any were the same. I called D.r Whelan;s office to get the lab list. There are 2 labs that they ordered that the pt still needs to get. The pt is aware. Carron Curie, CMA

## 2011-05-09 NOTE — Telephone Encounter (Signed)
Faxed office notes and pft.[name] [name]

## 2011-05-09 NOTE — Telephone Encounter (Signed)
I spoke with the patient and she states she saw Dr. Gary Fleet today and she did skin test and ordered labs as well. Pt wanted to make sure none of the labs were duplicated. So I called Dr. Octavia Bruckner office and spoke to her nurse. We compared labs and there are still 2 labs the pt needs which is immunocap and cpc with diff so I advised the pt to get these labs drawn. Also the pt skin test was negative for fumagatus but positive for aspergillus mix. Dr. Barnetta Chapel also changed the pt from xyzal to claritin due to GI upset on xyzal. I have requested pt records from Dr. Johney Maine office. Carron Curie, CMA

## 2011-05-09 NOTE — Telephone Encounter (Signed)
Attempted to call the pt at the number given--phone kept ringing but then it gave a busy signal.  Will try back later.

## 2011-05-09 NOTE — Telephone Encounter (Signed)
Ok thanks. I assume the 2 labs that she needed she is going to get drawn at [facility] office or are you referring to the 2 blood labs I had wanted ? OTherwise thanks.

## 2011-05-20 ENCOUNTER — Encounter: Payer: Self-pay | Admitting: Internal Medicine

## 2011-05-28 ENCOUNTER — Ambulatory Visit (INDEPENDENT_AMBULATORY_CARE_PROVIDER_SITE_OTHER)
Admission: RE | Admit: 2011-05-28 | Discharge: 2011-05-28 | Disposition: A | Discharge status: Home / Self Care | Payer: Medicare Other | Source: Ambulatory Visit | Attending: Adult Health | Admitting: Adult Health

## 2011-05-28 ENCOUNTER — Encounter: Payer: Self-pay | Admitting: Adult Health

## 2011-05-28 ENCOUNTER — Ambulatory Visit (INDEPENDENT_AMBULATORY_CARE_PROVIDER_SITE_OTHER): Payer: Medicare Other | Admitting: Adult Health

## 2011-05-28 VITALS — BP 124/72 | HR 56 | Temp 97.2°F | Ht 63.5 in | Wt 174.8 lb

## 2011-05-28 DIAGNOSIS — J45909 Unspecified asthma, uncomplicated: Secondary | ICD-10-CM

## 2011-05-28 DIAGNOSIS — J4550 Severe persistent asthma, uncomplicated: Secondary | ICD-10-CM

## 2011-05-28 MED ORDER — LEVOFLOXACIN 500 MG PO TABS: 500.0000 mg | ORAL_TABLET | Freq: Every day | ORAL | Status: AC

## 2011-05-28 NOTE — Patient Instructions (Addendum)
Levaquin 500mg  daily for 10 days  Mucinex DM Twice daily  As needed  Cough/congestion  Prednisone 20mg  daily for 1 week , then 10mg  daily for 1 week, hold at 5mg  daily  follow up 2 weeks Dr. Marchelle Gearing  Please contact office for sooner follow up if symptoms do not improve or worsen or seek emergency care

## 2011-05-28 NOTE — Progress Notes (Signed)
Subjective:    Patient ID: [name] [name], female    DOB: 06/21/1941, 70 y.o.   MRN: [id number]  HPI 1.  Baseline Chronic High Risk (multiple admits), Allergic, Moderate-Severe Persistent Asthma   - Allergies, gerd, sinus exacerbators.   -  Frequent prednisone -> daily low dose prednisone since FEb 20112 -  PFT 11/06/2010 - Mixed obstruction - restriction - fev1 1L/50%, Ratio 54, 24% BD response, TLC 3.6/78%, DLCO 16/75% - May 2012: Switched asthma care to Dr. Marchelle Gearing    2. Bronchiectasis, (mild RUL) seen on CT chest - April 2012   3.  Allergic rhinitis and allergic asthma.    - 2000s: - per hx strongly skin test positive at Dr Lucie Leather office  - 2004 - trialed xolair - per hx effective but developed rash subjectively attributed to twice daily dosing (chart review 2004-2005 states it was stopped due to subjective lack of improvement and an incidental rash resolution was noted in retrospect after stopping xolair)  - 2009: IgE 306 in July 2009. RAST positive for various grass, oak, elm, ragweed, plantain, lamb etc., s/p Allergy shot trial with Dr Maple Hudson - stopped due to transport issues - 2012: May - IgE 588.  - 2012: June - moving allergy MD to Dr Aris Georgia  4. Lung nodule, 6 mm, on CT scan. - April 2012. Possibly calcified. RUL (no prior for comparison but a 07/17/2000 CT chest reports simillar size nodule in similar location)  5.  Gastroesophageal reflux disease with hx of UGI scope showing Hiatal hernia.     6. History of sleep apnea.- non compliant with CPAP   7. Significant anxiety (also mood lability hx with prednisone, worsening anxiety with albuterol)  8. Recurrent AE-ASthma -  Admitted 4/23-4/30/12  OV 05/01/2011: Returns for followup to review IgE and PFTs. Has finished med calendar visit with NP. Is off brovana. Is now on xopenex prn (albuterol  - tremors and anxiety). Contiues  Severe persistent regimen for asthma of Prednisone at 2.5mg  per day, singulair, symbicort, compliantly.  Take spiriva prn only. Also on cetrizine and PPI control. Feels the best in long time. Asthma is very well controlled subjectively. No nocturnal awakenings.  Used xopenex prn only 3 times in total in 3 weeks. Labs show IgE while on prednisone to be 588. PFts 04/18/2011 shows NORMALIZATION (fev1 1.8L/93%, ratio 68, 9% BD response, DLCO 86). She is awaiting to see Dr. Gary Fleet for allergy issues. She remains interested in retrial of xolair; review of chart shows she did not find it helped and post-dc of drug several months later she noticed some nonspecific skin findings to improve.   05/28/11 Acute ov  Pt presents for an acute office visit. Complains of increased SOB, wheezing, prod cough with "heavy" yellow mucus x4days .  OTC not helping. Cough is wearing her out. Worse at night. Wheezing some.  No chest pain or fever.    Review of Systems Constitutional:   No  weight loss, night sweats,  Fevers, chills, fatigue, or  lassitude.  HEENT:   No headaches,  Difficulty swallowing,  Tooth/dental problems, or  Sore throat,                No sneezing, itching, ear ache, nasal congestion, post nasal drip,   CV:  No chest pain,  Orthopnea, PND, swelling in lower extremities, anasarca, dizziness, palpitations, syncope.   GI  No heartburn, indigestion, abdominal pain, nausea, vomiting, diarrhea, change in bowel habits, loss of appetite, bloody stools.  Resp:   No coughing up of blood   No chest wall deformity  Skin: no rash or lesions.  GU: no dysuria, change in color of urine, no urgency or frequency.  No flank pain, no hematuria   MS:  No joint pain or swelling.  No decreased range of motion.     Psych:  No change in mood or affect. No depression or anxiety.   .         Objective:   Physical Exam GEN: A/Ox3; pleasant , NAD, well nourished   HEENT:  [place]/AT,  EACs-clear, TMs-wnl, NOSE-clear, THROAT-clear, no lesions, no postnasal drip or exudate noted.   NECK:  Supple w/ fair ROM; no JVD;  normal carotid impulses w/o bruits; no thyromegaly or nodules palpated; no lymphadenopathy.  RESP  Coarse BS  W/o wheezes/ rales/ or rhonchi.no accessory muscle use, no dullness to percussion  CARD:  RRR, no m/r/g  , no peripheral edema, pulses intact, no cyanosis or clubbing.  GI:   Soft & nt; nml bowel sounds; no organomegaly or masses detected.  Musco: Warm bil, no deformities or joint swelling noted.   Neuro: alert, no focal deficits noted.    Skin: Warm, no lesions or rashes        Assessment & Plan:

## 2011-05-29 ENCOUNTER — Telehealth: Payer: Self-pay | Admitting: Adult Health

## 2011-05-29 NOTE — Telephone Encounter (Signed)
Pt seen by TP 7.17.12 for asthma flare and given levaquin 500mg  bid x10days.  Called spoke with patient who reports she took her levaquin yesterday evening and this morning > both times had dizziness, nausea, joint pain, edema in right foot.  Pt reports she did take this medication on a full stomach.  Requests an alternative - name brand please.  Will forward to TP for recs.  Allergies  Allergen Reactions  . Diclofenac Sodium     REACTION: Hives  . Penicillins   . Sulfonamide Derivatives   . Albuterol Anxiety    Switched to xopenex

## 2011-05-29 NOTE — Telephone Encounter (Signed)
Can stop levaquin but it does not cause swelling in one foot. So suspect something else is going on  It can cause nasusea and dizziness.  Make sure it is not swelling at achilles tendon or inability to flex foot. (ie tendo rupture/quinolones)  She has several allergies : so can switch to doxycycline 100mg  Twice daily  #20 , no refills  to see if this will help with bronchitis.   Please contact office for sooner follow up if symptoms do not improve or worsen or seek emergency care

## 2011-05-29 NOTE — Telephone Encounter (Signed)
ATC pt x 2 line was busy, WCB 

## 2011-05-29 NOTE — Telephone Encounter (Signed)
ATC her back again and line was still busy,WCB.

## 2011-05-30 MED ORDER — DOXYCYCLINE HYCLATE 100 MG PO TABS: 100.0000 mg | ORAL_TABLET | Freq: Two times a day (BID) | ORAL | Status: DC

## 2011-05-30 NOTE — Telephone Encounter (Signed)
Spoke with pt and notified of recs per TP. She verbalized understanding.  States can flex her foot, and it is no longer swollen or painful. She states that she has already stopped the levaquin. I called in rx for doxy and advised that she needs to call us if not improving or seek emergency care if needed. Pt verbalized understanding.

## 2011-05-30 NOTE — Assessment & Plan Note (Addendum)
Exacerbation   Plan Levaquin 500mg  daily for 10 days  Mucinex DM Twice daily  As needed  Cough/congestion  Prednisone 20mg  daily for 1 week , then 10mg  daily for 1 week, hold at 5mg  daily  follow up 2 weeks Dr. Marchelle Gearing  Please contact office for sooner follow up if symptoms do not improve or worsen or seek emergency care

## 2011-06-12 ENCOUNTER — Ambulatory Visit (INDEPENDENT_AMBULATORY_CARE_PROVIDER_SITE_OTHER): Payer: Medicare Other | Admitting: Internal Medicine

## 2011-06-12 ENCOUNTER — Encounter: Payer: Self-pay | Admitting: Internal Medicine

## 2011-06-12 VITALS — BP 120/76 | HR 63 | Temp 98.1°F | Ht 63.5 in | Wt 177.4 lb

## 2011-06-12 DIAGNOSIS — J45901 Unspecified asthma with (acute) exacerbation: Secondary | ICD-10-CM

## 2011-06-12 DIAGNOSIS — R059 Cough, unspecified: Secondary | ICD-10-CM

## 2011-06-12 DIAGNOSIS — R05 Cough: Secondary | ICD-10-CM

## 2011-06-12 DIAGNOSIS — J45909 Unspecified asthma, uncomplicated: Secondary | ICD-10-CM

## 2011-06-12 DIAGNOSIS — J4550 Severe persistent asthma, uncomplicated: Secondary | ICD-10-CM

## 2011-06-12 MED ORDER — PREDNISONE 10 MG PO TABS: ORAL_TABLET | ORAL | Status: DC

## 2011-06-12 MED ORDER — METHYLPREDNISOLONE ACETATE 80 MG/ML IJ SUSP
80.0000 mg | Freq: Once | INTRAMUSCULAR | Status: AC
  Administered 2011-06-12: 80 mg via INTRAMUSCULAR

## 2011-06-12 MED ORDER — DOXYCYCLINE HYCLATE 100 MG PO TABS: 100.0000 mg | ORAL_TABLET | Freq: Two times a day (BID) | ORAL | Status: DC

## 2011-06-12 MED ORDER — LEVALBUTEROL HCL 1.25 MG/3ML IN NEBU
1.2500 mg | INHALATION_SOLUTION | Freq: Once | RESPIRATORY_TRACT | Status: AC
  Administered 2011-06-12: 1.25 mg via RESPIRATORY_TRACT

## 2011-06-12 NOTE — Assessment & Plan Note (Signed)
Clearly in exacerbation. I am wondering if compliance is an issue but she insists she is compliant with medications. Was intolerant to levaquin 2 weeks ago. ABPA workup is negative but done while on prednisone so ABPA cannot be excluded  PLAN # FOR MAINTENANCE - continue spiriva, singulair, LABA/ICS, and daily prednisone at 5mg   - continue allergy shots with Dr. Gary Fleet - start Geoffry Paradise at her request; will do once monthly schedule at her request - continue PPI for GERD controil, will emphasize more at followup  - at followup if not responding to above wil have to consider short trial itraconazole for ABPA, psych consult for mood  #FOR ACUTE EXACERBATION  - 2 week pred burst but taper to maintenance at 5mg  per day  - abx doxycycline.

## 2011-06-12 NOTE — Patient Instructions (Signed)
You are in middle of an asthma attack Please have xopenex and depot medrol 80mg  IM x  1 now in office Please take prednisone 60mg  daily x 3 days, then 40mg  daily x 3 days, then 30mg  daily x 3 days, then 20mg  daily x 3 days, then 10mg  daily x 3 days, then 5mg  daily to continue Please take doxycycline 100mg  po twice daily after meals x 5 days - call if nauseated Return next week for spirometry and followup I am setting you up with xolair (half dose regimen) through our coordinator Depending on response to above, we might consider anti fungal itraconazole for possible ABPA Please follow with Dr. Gary Fleet for allergy

## 2011-06-12 NOTE — Progress Notes (Signed)
Subjective:    Patient ID: [name] [name], female    DOB: 1941/01/31, 70 y.o.   MRN: [id number]  HPI  1.  Baseline Chronic High Risk (multiple admits), Allergic, Moderate-Severe Persistent Asthma   - Allergies, gerd, sinus exacerbators.   -  Frequent prednisone -> daily low dose prednisone since FEb 20112 -  PFT 11/06/2010 - Mixed obstruction - restriction - fev1 1L/50%, Ratio 54, 24% BD response, TLC 3.6/78%, DLCO 16/75% - May 2012: Switched asthma care to Dr. Marchelle Gearing - June 2012 PFT - showed normalization with prednisone:  PFts 04/18/2011 shows NORMALIZATION (fev1 1.8L/93%, ratio 68, 9% BD response, DLCO 86).    2. Bronchiectasis, (mild RUL) seen on CT chest - April 2012   3.  Allergic rhinitis and allergic asthma.    - 2000s: - per hx strongly skin test positive at Dr Lucie Leather office  - 2004 - trialed xolair - per hx effective but developed rash subjectively attributed to twice daily dosing (chart review 2004-2005 states it was stopped due to subjective lack of improvement and an incidental rash resolution was noted in retrospect after stopping xolair)  - 2009: IgE 306 in July 2009. RAST positive for various grass, oak, elm, ragweed, plantain, lamb etc., s/p Allergy shot trial with Dr Maple Hudson - stopped due to transport issues - 2012: May - IgE 588.  - 2012: June - moved  allergy MD to Dr Aris Georgia  4. Lung nodule, 6 mm, on CT scan. - April 2012. Possibly calcified. RUL (no prior for comparison but a 07/17/2000 CT chest reports simillar size nodule in similar location). Repeat CT needed April 2012  5.  Gastroesophageal reflux disease with hx of UGI scope showing Hiatal hernia.     6. History of sleep apnea.- non compliant with CPAP   7. Significant anxiety (also mood lability hx with prednisone, worsening anxiety with albuterol)  8. Recurrent AE-ASthma -  Admitted 4/23-4/30/12 - OPD AE asthma Rx July 2012, august 2012    06/12/2011: OV: Followup for asthma, possible ABPA (reviewed tests  on prednisone in June 2012: asp antibody IgE asp fumigatus 0.24 ku/L which is borderline/equivocal, IgG for all all aspergiluus preciptins - NEGATIVE. Asp fumigatus skin test - negative at Dr Everlena Cooper but 2 + positive for mixed aspergillus positive), allergic component. STates she feels miserable overall. Has not improved from prior acute visit to NP 2 weeks ago when she had AE-asthma. Levaquin made her nauseated. Initially improved but now getting worse. Main symptoim is fatigue and dyspnea. This is associated with chest tightness, wheeze, yellow sputum. Rates symptoms as moderate-severe. Insists she is compliant with medications. Currently on severe persistent regimen of prednisone 5mg , symbicort, singulair and spiriva. Of note, she has seen Dr. Gary Fleet allergist in interim: has had skin tests - results n/a but apparently positive. Is on weekly allergy shots. She is open to trying xolair again but at half dose compared to before. Spirometry today: shows Fev1 is DOWN - 1.1L/58% predicted  Past Medical History  Diagnosis Date  . Sleep apnea   . ALLERGIC RHINITIS   . Colon polyp, hyperplastic 02/2003  . Chronic rhinosinusitis   . Hyperlipidemia   . Acute bronchitis   . Routine general medical examination at health care facility   . Right hip pain   . Left knee pain   . Osteoporosis   . GERD (gastroesophageal reflux disease)   . Chronic obstructive asthma     PFT 11/06/10 - FEV1 1.24/ 0.62; FEV1/FVC 0.56, TLC  0.78; DLCO 0.75     Family History  Problem Relation Age of Onset  . Cancer Neg Hx     colon     History   Social History  . Marital Status: Divorced    Spouse Name: N/A    Number of Children: 2  . Years of Education: N/A   Occupational History  . disabled     [employer]   Social History Main Topics  . Smoking status: Former Smoker -- 3 years    Types: Cigarettes    Quit date: 11/11/1962  . Smokeless tobacco: Not on file  . Alcohol Use: No  . Drug Use:  No  . Sexually Active: Not on file   Other Topics Concern  . Not on file   Social History Narrative   4 brothers4 sistersPt gets regular exerciseMoved in with daughter      Allergies  Allergen Reactions  . Diclofenac Sodium     REACTION: Hives  . Penicillins   . Sulfonamide Derivatives   . Albuterol Anxiety    Switched to xopenex     Outpatient Prescriptions Prior to Visit  Medication Sig Dispense Refill  . ALPRAZolam (XANAX) 0.5 MG tablet Take 1 tablet (0.5 mg total) by mouth every 6 (six) hours as needed.  90 tablet  5  . amLODipine (NORVASC) 5 MG tablet Take 5 mg by mouth daily. BMN      . budesonide-formoterol (SYMBICORT) 160-4.5 MCG/ACT inhaler Inhale 2 puffs into the lungs 2 (two) times daily. Rinse mouth  1 Inhaler  prn  . cycloSPORINE (RESTASIS) 0.05 % ophthalmic emulsion Place 1 drop into both eyes 2 (two) times daily. BMN      . desloratadine (CLARINEX) 5 MG tablet Take 5 mg by mouth daily. BMN       . EPINEPHrine (EPIPEN) 0.3 mg/0.3 mL DEVI Use as directed for severe allergic reaction       . esomeprazole (NEXIUM) 40 MG capsule Take 40 mg by mouth daily before breakfast. BMN      . hydrochlorothiazide 25 MG tablet Take 25 mg by mouth daily.        Marland Kitchen HYDROcodone-acetaminophen (VICODIN ES) 7.5-750 MG per tablet Take 1 tablet by mouth every 6 (six) hours as needed. BMN       . levalbuterol (XOPENEX HFA) 45 MCG/ACT inhaler Inhale 2 puffs into the lungs every 4 (four) hours as needed. BMN       . levalbuterol (XOPENEX) 0.63 MG/3ML nebulizer solution Take 1 ampule by nebulization every 4 (four) hours as needed. BMN       . montelukast (SINGULAIR) 10 MG tablet Take 10 mg by mouth at bedtime. BMN      . olopatadine (PATANOL) 0.1 % ophthalmic solution Place 1 drop into both eyes daily. BMN      . predniSONE (DELTASONE) 5 MG tablet Take 2.5 mg by mouth daily. 1-2 tabs daily for long term control of asthma.       . tiotropium (SPIRIVA) 18 MCG inhalation capsule Place 18 mcg into  inhaler and inhale daily.        Marland Kitchen doxycycline (VIBRA-TABS) 100 MG tablet Take 1 tablet (100 mg total) by mouth 2 (two) times daily.  20 tablet  0     Review of Systems  Constitutional: Negative for fever and unexpected weight change.  HENT: Negative for ear pain, nosebleeds, congestion, sore throat, rhinorrhea, sneezing, trouble swallowing, dental problem, postnasal drip and sinus pressure.   Eyes:  Negative for redness and itching.  Respiratory: Positive for cough, chest tightness, shortness of breath and wheezing.   Cardiovascular: Negative for palpitations and leg swelling.  Gastrointestinal: Negative for nausea and vomiting.  Genitourinary: Negative for dysuria.  Musculoskeletal: Negative for joint swelling.  Skin: Negative for rash.  Neurological: Negative for headaches.  Hematological: Does not bruise/bleed easily.  Psychiatric/Behavioral: Negative for dysphoric mood. The patient is not nervous/anxious.        Objective:   Physical Exam  Vitals reviewed. Constitutional: She is oriented to person, place, and time. She appears well-developed and well-nourished. No distress.       overweight  HENT:  Head: Normocephalic and atraumatic.  Right Ear: External ear normal.  Left Ear: External ear normal.  Mouth/Throat: Oropharynx is clear and moist. No oropharyngeal exudate.  Eyes: Conjunctivae and EOM are normal. Pupils are equal, round, and reactive to light. Right eye exhibits no discharge. Left eye exhibits no discharge. No scleral icterus.  Neck: Normal range of motion. Neck supple. No JVD present. No tracheal deviation present. No thyromegaly present.  Cardiovascular: Normal rate, regular rhythm, normal heart sounds and intact distal pulses.  Exam reveals no gallop and no friction rub.   No murmur heard. Pulmonary/Chest: Effort normal and breath sounds normal. No respiratory distress. She has no wheezes. She has no rales. She exhibits no tenderness.  Abdominal: Soft. Bowel  sounds are normal. She exhibits no distension and no mass. There is no tenderness. There is no rebound and no guarding.  Musculoskeletal: Normal range of motion. She exhibits no edema and no tenderness.  Lymphadenopathy:    She has no cervical adenopathy.  Neurological: She is alert and oriented to person, place, and time. She has normal reflexes. No cranial nerve deficit. She exhibits normal muscle tone. Coordination normal.  Skin: Skin is warm and dry. No rash noted. She is not diaphoretic. No erythema. No pallor.  Psychiatric: Judgment and thought content normal.       Crying easily Labile mood          Assessment & Plan:

## 2011-06-18 ENCOUNTER — Ambulatory Visit: Payer: Medicare Other | Admitting: Internal Medicine

## 2011-06-19 ENCOUNTER — Encounter: Payer: Self-pay | Admitting: Internal Medicine

## 2011-06-21 ENCOUNTER — Encounter: Payer: Self-pay | Admitting: Internal Medicine

## 2011-06-21 ENCOUNTER — Ambulatory Visit (INDEPENDENT_AMBULATORY_CARE_PROVIDER_SITE_OTHER): Payer: Medicare Other | Admitting: Internal Medicine

## 2011-06-21 VITALS — BP 110/60 | HR 74 | Temp 98.4°F | Ht 63.0 in | Wt 178.6 lb

## 2011-06-21 DIAGNOSIS — J45909 Unspecified asthma, uncomplicated: Secondary | ICD-10-CM

## 2011-06-21 DIAGNOSIS — J4550 Severe persistent asthma, uncomplicated: Secondary | ICD-10-CM

## 2011-06-21 NOTE — Progress Notes (Signed)
Subjective:    Patient ID: [name] [name], female    DOB: 06-17-1941, 70 y.o.   MRN: [id number]  HPI PROBLEM LIST 1. ASTHMA -  Baseline Chronic High Risk (multiple admits), Allergic, Moderate-Severe Persistent Asthma   - Allergies, gerd, sinus exacerbators.   -  Frequent prednisone -> daily low dose prednisone since FEb 20112 -  PFT 11/06/2010 - Mixed obstruction - restriction - fev1 1L/50%, Ratio 54, 24% BD response, TLC 3.6/78%, DLCO 16/75% - May 2012: Switched asthma care to Dr. Marchelle Gearing - June 2012 PFT - showed normalization with prednisone:  PFts 04/18/2011 shows NORMALIZATION (fev1 1.8L/93%, ratio 68, 9% BD response, DLCO 86).  - August 2012: Fev1: 1.1lL/58% -> prednisone burst-> 1.09L/60%, Ratio 65 (no    2. Bronchiectasis, (mild RUL) seen on CT chest - April 2012 ( ABPA wu tests on prednisone in June 2012: asp antibody IgE asp fumigatus 0.24 ku/L which is borderline/equivocal, IgG for all all aspergiluus preciptins - NEGATIVE. 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  Allergic rhinitis and allergic asthma.   - 2000s: - per hx strongly skin test positive at Dr Lucie Leather office  - 2004 - trialed xolair - per hx effective but developed rash subjectively attributed to twice daily dosing (chart review 2004-2005 states it was stopped due to subjective lack of improvement and an incidental rash resolution was noted in retrospect after stopping xolair)  - 2009: IgE 306 in July 2009. RAST positive for various grass, oak, elm, ragweed, plantain, lamb etc., s/p Allergy shot trial with Dr Maple Hudson - stopped due to transport issues - 2012: May - IgE 588.  - 2012: June - moved  allergy MD to Dr Aris Georgia  4. Lung nodule, 6 mm, on CT scan. - April 2012. Possibly calcified. RUL (no prior for comparison but a 07/17/2000 CT chest reports simillar size nodule in similar location). Repeat CT needed April 2013  5.  Gastroesophageal reflux disease  with hx of UGI scope showing Hiatal hernia.     6. History of sleep apnea.- non compliant with CPAP   7. Significant anxiety (also mood lability hx with prednisone, worsening anxiety with albuterol)  8. Recurrent AE-ASthma -  Admitted 4/23-4/30/12 - OPD AE asthma Rx:  July 2012, august 2012   06/21/2011: OV: 10 day followup with spirometry after prednisone burst. Feels well. Improved subjectively.  Denies wheeze, cough, chest tighness, fever, sputum. Albuterol use is nil. Insists she is compliant with medications. Nocturnal awakening dropped from once every few hours to once per night.  Allergy shots continue. Yet to start xolair.  urrently on severe persistent regimen of prednisone 5mg , symbicort, singulair and spiriva.  Spirometry today: shows Fev1 is DOWN - 1.09L/60% predicted and unchanged  Past Medical History  Diagnosis Date  . Sleep apnea   . ALLERGIC RHINITIS   . Colon polyp, hyperplastic 02/2003  . Chronic rhinosinusitis   . Hyperlipidemia   . Acute bronchitis   . Routine general medical examination at health care facility   . Right hip pain   . Left knee pain   . Osteoporosis   . GERD (gastroesophageal reflux disease)   . Chronic obstructive asthma     PFT 11/06/10 - FEV1 1.24/ 0.62; FEV1/FVC 0.56, TLC 0.78; DLCO 0.75     Family History  Problem Relation Age of Onset  . Cancer Neg Hx     colon     History  Social History  . Marital Status: Divorced    Spouse Name: N/A    Number of Children: 2  . Years of Education: N/A   Occupational History  . disabled     [employer]   Social History Main Topics  . Smoking status: Former Smoker -- 3 years    Types: Cigarettes    Quit date: 11/11/1962  . Smokeless tobacco: Not on file  . Alcohol Use: No  . Drug Use: No  . Sexually Active: Not on file   Other Topics Concern  . Not on file   Social History Narrative   4 brothers4 sistersPt gets regular exerciseMoved in with daughter      Allergies    Allergen Reactions  . Diclofenac Sodium     REACTION: Hives  . Doxycycline Nausea And Vomiting  . Penicillins   . Sulfonamide Derivatives   . Albuterol Anxiety    Switched to xopenex     Outpatient Prescriptions Prior to Visit  Medication Sig Dispense Refill  . ALPRAZolam (XANAX) 0.5 MG tablet Take 1 tablet (0.5 mg total) by mouth every 6 (six) hours as needed.  90 tablet  5  . amLODipine (NORVASC) 5 MG tablet Take 5 mg by mouth daily. BMN      . budesonide-formoterol (SYMBICORT) 160-4.5 MCG/ACT inhaler Inhale 2 puffs into the lungs 2 (two) times daily. Rinse mouth  1 Inhaler  prn  . cycloSPORINE (RESTASIS) 0.05 % ophthalmic emulsion Place 1 drop into both eyes 2 (two) times daily. BMN      . desloratadine (CLARINEX) 5 MG tablet Take 5 mg by mouth daily. BMN       . EPINEPHrine (EPIPEN) 0.3 mg/0.3 mL DEVI Use as directed for severe allergic reaction       . esomeprazole (NEXIUM) 40 MG capsule Take 40 mg by mouth daily before breakfast. BMN      . hydrochlorothiazide 25 MG tablet Take 25 mg by mouth daily.        Marland Kitchen HYDROcodone-acetaminophen (VICODIN ES) 7.5-750 MG per tablet Take 1 tablet by mouth every 6 (six) hours as needed. BMN       . levalbuterol (XOPENEX HFA) 45 MCG/ACT inhaler Inhale 2 puffs into the lungs every 4 (four) hours as needed. BMN       . levalbuterol (XOPENEX) 0.63 MG/3ML nebulizer solution Take 1 ampule by nebulization every 4 (four) hours as needed. BMN       . montelukast (SINGULAIR) 10 MG tablet Take 10 mg by mouth at bedtime. BMN      . olopatadine (PATANOL) 0.1 % ophthalmic solution Place 1 drop into both eyes daily. BMN      . predniSONE (DELTASONE) 5 MG tablet Take 2.5 mg by mouth daily. 1-2 tabs daily for long term control of asthma.       . tiotropium (SPIRIVA) 18 MCG inhalation capsule Place 18 mcg into inhaler and inhale daily.        Marland Kitchen doxycycline (VIBRA-TABS) 100 MG tablet Take 1 tablet (100 mg total) by mouth 2 (two) times daily.  10 tablet  0  .  predniSONE (DELTASONE) 10 MG tablet Take 6 tabs daily x 3 days, 4 tabs x 3 days, 3 tabs x 3 days, 2 tabs x 3 days, 1 tab x 3 days, then 1/2 tab daily and stay  55 tablet  0     Review of Systems  Constitutional: Negative for fever and unexpected weight change.  HENT: Negative for  ear pain, nosebleeds, congestion, sore throat, rhinorrhea, sneezing, trouble swallowing, dental problem, postnasal drip and sinus pressure.   Eyes: Negative for redness and itching.  Respiratory: Positive for cough and shortness of breath. Negative for chest tightness and wheezing.   Cardiovascular: Negative for palpitations and leg swelling.  Gastrointestinal: Negative for nausea and vomiting.  Genitourinary: Negative for dysuria.  Musculoskeletal: Negative for joint swelling.  Skin: Negative for rash.  Neurological: Negative for headaches.  Hematological: Does not bruise/bleed easily.  Psychiatric/Behavioral: Negative for dysphoric mood. The patient is not nervous/anxious.        Objective:   Physical Exam  GEN: A/Ox3; pleasant , NAD, well nourished   HEENT:  [place]/AT,  EACs-clear, TMs-wnl, NOSE-clear, THROAT-clear, no lesions, no postnasal drip or exudate noted.   NECK:  Supple w/ fair ROM; no JVD; normal carotid impulses w/o bruits; no thyromegaly or nodules palpated; no lymphadenopathy.  RESP  Coarse BS  W/o wheezes/ rales/ or rhonchi.no accessory muscle use, no dullness to percussion  CARD:  RRR, no m/r/g  , no peripheral edema, pulses intact, no cyanosis or clubbing.  GI:   Soft & nt; nml bowel sounds; no organomegaly or masses detected.  Musco: Warm bil, no deformities or joint swelling noted.   Neuro: alert, no focal deficits noted.    Skin: Warm, no lesions or rashes       Assessment & Plan:

## 2011-06-21 NOTE — Assessment & Plan Note (Signed)
Subjectively better. BUt spiro is unchanged. Given prior normalization and current lack of normalizaton coming off on the heels of pred burst, I really suspect non-compliance which she flat out denies. Given current subjective wellness wil not add pred burst or antibiotics. Will ensure xolair started

## 2011-06-21 NOTE — Patient Instructions (Signed)
Continue your medications and allergy shots without fail Glad you are better though breathing numbers are unchanged from last time  Nurse will find out what is going on with starting xolair here Will place doxycycline in allergy list due to nausea Return in 3 months with spirometry at followup

## 2011-07-18 ENCOUNTER — Other Ambulatory Visit: Payer: Self-pay | Admitting: Internal Medicine

## 2011-07-18 DIAGNOSIS — Z1231 Encounter for screening mammogram for malignant neoplasm of breast: Secondary | ICD-10-CM

## 2011-07-19 ENCOUNTER — Telehealth: Payer: Self-pay | Admitting: *Deleted

## 2011-07-19 ENCOUNTER — Telehealth: Payer: Self-pay | Admitting: Internal Medicine

## 2011-07-19 ENCOUNTER — Encounter: Payer: Self-pay | Admitting: Internal Medicine

## 2011-07-19 ENCOUNTER — Ambulatory Visit (INDEPENDENT_AMBULATORY_CARE_PROVIDER_SITE_OTHER): Payer: Medicare Other | Admitting: Internal Medicine

## 2011-07-19 DIAGNOSIS — R209 Unspecified disturbances of skin sensation: Secondary | ICD-10-CM

## 2011-07-19 DIAGNOSIS — R202 Paresthesia of skin: Secondary | ICD-10-CM

## 2011-07-19 DIAGNOSIS — M810 Age-related osteoporosis without current pathological fracture: Secondary | ICD-10-CM

## 2011-07-19 MED ORDER — HYDROCODONE-ACETAMINOPHEN 7.5-750 MG PO TABS: 1.0000 | ORAL_TABLET | Freq: Four times a day (QID) | ORAL | Status: DC | PRN

## 2011-07-19 MED ORDER — SINGULAIR 10 MG PO TABS: 10.0000 mg | ORAL_TABLET | Freq: Every day | ORAL | Status: DC

## 2011-07-19 NOTE — Telephone Encounter (Signed)
[id number]  

## 2011-07-19 NOTE — Telephone Encounter (Signed)
Patient requesting that our office call pharm and authorize singulair to be brand only. Per pt, she says she was always uses brand and does not know why it was changed. Sent in new rx to pharm.

## 2011-07-19 NOTE — Telephone Encounter (Signed)
Spoke with pt. She states that she is confused about when to have next ct chest. Her AVS from last visit had something at the top about her ct chest expiring soon. I think she is referring to the "future orders"- looks like her CT is not due until next yr. Do you want her to go ahead and have it sched? ? Which month it should be done. Please advise, thanks!

## 2011-07-21 NOTE — Progress Notes (Signed)
  Subjective:    Patient ID: [name] [name], female    DOB: Dec 14, 1940, 70 y.o.   MRN: [id number]  HPI [name] [name] presents today with a chief complaint of left arm and hand pain. She has had some intermittent paresthesia in the left hand. No loss of function or inability to conduct her usual ADLs. She has had no injury or strain. She has no prior problem of this type and no history of cervical disease.  She reports that she is having recurrent respiratory symptoms. She is followed by the pulmonary service.   I have reviewed the patient's medical history in detail and updated the computerized patient record.    Review of Systems System review is negative for any constitutional, cardiac, pulmonary, GI or neuro symptoms or complaints     Objective:   Physical Exam Vitals reviewed - stable Gen'l - mildly overweight AA woman in no distress HEENT - C&S clear Chest - no increased work of breathing, expiratory wheezing noted - graded as mild; no rales Cor -RRR Ext - normal ROM actively and passively left shoulder, nl ROM neck, nl grip strength and sensation. Postive Tinel's sign. Nl DTRs biceps, triceps, radial tendons.        Assessment & Plan:  Arm pain - normal exam with no radicular findings except for discomfort and positive tinel's sign.   Plan - C-spine series orders (no results in EMR)           NCS study ordered.

## 2011-07-21 NOTE — Assessment & Plan Note (Signed)
Due for bone density study.  Plan - DXA ordered.

## 2011-07-22 ENCOUNTER — Other Ambulatory Visit (INDEPENDENT_AMBULATORY_CARE_PROVIDER_SITE_OTHER): Payer: Medicare Other

## 2011-07-22 ENCOUNTER — Encounter: Payer: Self-pay | Admitting: Gastroenterology

## 2011-07-22 ENCOUNTER — Ambulatory Visit (INDEPENDENT_AMBULATORY_CARE_PROVIDER_SITE_OTHER)
Admission: RE | Admit: 2011-07-22 | Discharge: 2011-07-22 | Disposition: A | Discharge status: Home / Self Care | Payer: Medicare Other | Source: Ambulatory Visit | Attending: Internal Medicine | Admitting: Internal Medicine

## 2011-07-22 ENCOUNTER — Ambulatory Visit (INDEPENDENT_AMBULATORY_CARE_PROVIDER_SITE_OTHER): Payer: Medicare Other | Admitting: Gastroenterology

## 2011-07-22 VITALS — BP 140/70 | HR 90 | Ht 64.0 in | Wt 182.0 lb

## 2011-07-22 DIAGNOSIS — M810 Age-related osteoporosis without current pathological fracture: Secondary | ICD-10-CM

## 2011-07-22 DIAGNOSIS — R202 Paresthesia of skin: Secondary | ICD-10-CM

## 2011-07-22 DIAGNOSIS — R109 Unspecified abdominal pain: Secondary | ICD-10-CM

## 2011-07-22 DIAGNOSIS — R198 Other specified symptoms and signs involving the digestive system and abdomen: Secondary | ICD-10-CM

## 2011-07-22 DIAGNOSIS — K219 Gastro-esophageal reflux disease without esophagitis: Secondary | ICD-10-CM

## 2011-07-22 DIAGNOSIS — K3184 Gastroparesis: Secondary | ICD-10-CM | POA: Insufficient documentation

## 2011-07-22 DIAGNOSIS — R209 Unspecified disturbances of skin sensation: Secondary | ICD-10-CM

## 2011-07-22 LAB — CBC WITH DIFFERENTIAL/PLATELET
Basophils Absolute: 0.1 10*3/uL (ref 0.0–0.1)
Eosinophils Absolute: 0.6 10*3/uL (ref 0.0–0.7)
MCHC: 32.6 g/dL (ref 30.0–36.0)
MCV: 87.4 fl (ref 78.0–100.0)
Monocytes Absolute: 0.6 10*3/uL (ref 0.1–1.0)
Neutrophils Relative %: 60.7 % (ref 43.0–77.0)
Platelets: 266 10*3/uL (ref 150.0–400.0)
RDW: 14.5 % (ref 11.5–14.6)

## 2011-07-22 MED ORDER — NA SULFATE-K SULFATE-MG SULF 17.5-3.13-1.6 GM/177ML PO SOLN: ORAL | Status: DC

## 2011-07-22 MED ORDER — HYOSCYAMINE SULFATE 0.125 MG PO TABS: ORAL_TABLET | ORAL | Status: DC

## 2011-07-22 NOTE — Progress Notes (Signed)
History of Present Illness: This is a 70 year old female who relates a 4 month history of recurrent lower abdominal pain associated with more frequent bowel movements. She has bowel movements after every meal that are well formed. She notes no loose stools. Occasionally her abdominal pain is improved with a bowel movement and often it is not. He has been maintained on corticosteroids for management of asthma and bronchiectasis. Her last colonoscopy was in 2004 showing hyperplastic polyps. Last upper endoscopy in 02/2009 show H. pylori gastritis. She completed part of the antibiotic therapy however she developed antibiotic related side effects so it was discontinued.  Current Medications, Allergies, Past Medical History, Past Surgical History, Family History and Social History were reviewed in [employer] record.  Physical Exam: General: Well developed , well nourished, no acute distress Head: Normocephalic and atraumatic Eyes:  sclerae anicteric, EOMI Ears: Normal auditory acuity Mouth: No deformity or lesions Lungs: Coarse breath sounds with scattered rhonchi Heart: Regular rate and rhythm; no murmurs, rubs or bruits Abdomen: Soft, non tender and non distended. No masses, hepatosplenomegaly or hernias noted. Normal Bowel sounds Rectal: Deferred to colonoscopy Musculoskeletal: Symmetrical with no gross deformities  Pulses:  Normal pulses noted Extremities: No clubbing, cyanosis, edema or deformities noted Neurological: Alert oriented x 4, grossly nonfocal Psychological:  Alert and cooperative. Anxious.  Assessment and Recommendations:  1. Lower abdominal pain associated with a persistent change in bowel habits. Possible irritable bowel syndrome. Need to exclude colorectal neoplasms, inflammatory bowel disease and other disorders. Trial of hyoscyamine 1-2 before meals and every 4 hours as needed. Blood work as ordered. Schedule abdominal and pelvic CT. Schedule colonoscopy.  The risks, benefits, and alternatives to colonoscopy with possible biopsy and possible polypectomy were discussed with the patient and they consent to proceed.   2. GERD. Continue Nexium 40 mg every morning along with standard antireflux measures.  3. History of Helicobacter pylori gastritis, partially treated.  4. Gastroparesis. Delayed gastric emptying noted in 2010. Currently without symptoms on small frequent meals and management of GERD.

## 2011-07-22 NOTE — Patient Instructions (Signed)
You have been scheduled for a CT scan of the abdomen and pelvis at [facility] ([address]---this is in the same building as [facility]).   You are scheduled on 08/07/11 Wednesday at 1:00 pm. You should arrive 15 minutes prior to your appointment time for registration. Please follow the written instructions below on the day of your exam:  WARNING: IF YOU ARE ALLERGIC TO IODINE/X-RAY DYE, PLEASE NOTIFY RADIOLOGY IMMEDIATELY AT [phone]! YOU WILL BE GIVEN A 13 HOUR PREMEDICATION PREP.  1) Do not eat or drink anything after 9:00 am (4 hours prior to your test) 2) You have been given 2 bottles of oral contrast to drink. The solution may taste better if refrigerated, but do NOT add ice or any other liquid to this solution. Shake well before drinking.    Drink 1 bottle of contrast @ 11:00 am (2 hours prior to your exam)  Drink 1 bottle of contrast @ 12:00 pm (1 hour prior to your exam)  You may take any medications as prescribed with a small amount of water except for the following: Metformin, Glucophage, Glucovance, Avandamet, Riomet, Fortamet, Actoplus Met, Janumet, Glumetza or Metaglip. The above medications must be held the day of the exam AND 48 hours after the exam.  The purpose of you drinking the oral contrast is to aid in the visualization of your intestinal tract. The contrast solution may cause some diarrhea. Before your exam is started, you will be given a small amount of fluid to drink. Depending on your individual set of symptoms, you may also receive an intravenous injection of x-ray contrast/dye. Plan on being at [facility] for 30 minutes or long, depending on the type of exam you are having performed.  If you have any questions regarding your exam or if you need to reschedule, you may call the CT department at [phone] between the hours of 8:00 am and 5:00 pm,  Monday-Friday.  ________________________________________________________________________ [name] [name] have been scheduled for a colonoscopy. Please follow written instructions given to you at your visit today.  Your physician has requested that you go to the basement for the following lab work before leaving today: CBC, CMET, Lipase We have sent the following medications to your pharmacy for you to pick up at your convenience: Levsin. Take 1-2 tablets by mouth before meals. CC: Dr Debby Bud

## 2011-07-23 LAB — COMPREHENSIVE METABOLIC PANEL
ALT: 21 U/L (ref 0–35)
AST: 23 U/L (ref 0–37)
Albumin: 3.9 g/dL (ref 3.5–5.2)
Alkaline Phosphatase: 58 U/L (ref 39–117)
BUN: 25 mg/dL — ABNORMAL HIGH (ref 6–23)
Potassium: 5.1 mEq/L (ref 3.5–5.1)
Sodium: 141 mEq/L (ref 135–145)
Total Protein: 7.7 g/dL (ref 6.0–8.3)

## 2011-07-25 ENCOUNTER — Ambulatory Visit
Admission: RE | Admit: 2011-07-25 | Discharge: 2011-07-25 | Disposition: A | Discharge status: Home / Self Care | Payer: Medicare Other | Source: Ambulatory Visit | Attending: Internal Medicine | Admitting: Internal Medicine

## 2011-07-25 DIAGNOSIS — Z1231 Encounter for screening mammogram for malignant neoplasm of breast: Secondary | ICD-10-CM

## 2011-07-25 NOTE — Telephone Encounter (Signed)
[id number] x 1 

## 2011-07-25 NOTE — Telephone Encounter (Signed)
She needs pred burst and antibiotics (avelox). If she is so dyspneic that she cannot complete sentence she should be gooing to ER. Can she even go to pharmacy and get her med ? If she cannot, go to er

## 2011-07-25 NOTE — Telephone Encounter (Signed)
Per last OV note MR wants repeat CT in April 2013. I called the pt to advise her of this. She sounded like she was SOB so I asked hoer how she was doing and she states not so well. She is c/o productive cough with yellow phlegm, increased SOB, wheezing, chest congestion, and fatigue x 2 days. She states she does not feel like getting out of bed to come in for OV.  Pt could not say more then a few words without stopping to take a breath. I advised the pt I will send MR a message to see what he recs for her, but in the meantime if she gets worse to call ems and go to ER. Pt states understanding.  Please advise. Carron Curie, CMA Allergies  Allergen Reactions  . Diclofenac Sodium     REACTION: Hives  . Doxycycline Nausea And Vomiting  . Penicillins   . Sulfonamide Derivatives   . Albuterol Anxiety    Switched to xopenex

## 2011-07-26 MED ORDER — PREDNISONE 5 MG PO TABS: 2.5000 mg | ORAL_TABLET | Freq: Every day | ORAL | Status: DC

## 2011-07-26 MED ORDER — MOXIFLOXACIN HCL 400 MG PO TABS: 400.0000 mg | ORAL_TABLET | Freq: Every day | ORAL | Status: AC

## 2011-07-26 NOTE — Telephone Encounter (Signed)
Pt is aware of instructions for avelox and prednisone taper. She was also notified that ct chest will not be done until April 2013 and we will call her oncethis has been scheduled. Pt instructed to call if her sxs do not improve or seek emergency help if needed. Pt verbalized understanding of all instructions.

## 2011-07-26 NOTE — Telephone Encounter (Signed)
[id number]. Rathana Viveros, CMA  

## 2011-07-26 NOTE — Telephone Encounter (Signed)
Pt would like some clarification on whether she is to have CT done now or next year. Pls also clarify how pt is to taper on the prednisone and how many days she should take avelox. Pt says she has prednisone at home and doesn;t need a new prescription for that one, just instructions on how to taper. Pls advise. Allergies  Allergen Reactions  . Diclofenac Sodium     REACTION: Hives  . Doxycycline Nausea And Vomiting  . Penicillins   . Sulfonamide Derivatives   . Albuterol Anxiety    Switched to xopenex

## 2011-07-26 NOTE — Telephone Encounter (Signed)
1. Repeat ct chest needed April 2013. IT is on my hpi on top 2. Prednisone 40mg  daily x 2 days, then 30mg  daily x 2 days, then 20mg  daily x 2 days, then 10mg  daily x 2 days, then 5mg  daily x 2 days and then 2.5mg  daily to continue (ensure 2.5mg  daily is the daily maintenance dose) 3. avelox 400mg  daily x 5 days

## 2011-07-29 ENCOUNTER — Encounter: Payer: Self-pay | Admitting: Internal Medicine

## 2011-08-06 ENCOUNTER — Ambulatory Visit (INDEPENDENT_AMBULATORY_CARE_PROVIDER_SITE_OTHER): Payer: Medicare Other | Admitting: Internal Medicine

## 2011-08-06 DIAGNOSIS — M159 Polyosteoarthritis, unspecified: Secondary | ICD-10-CM

## 2011-08-06 DIAGNOSIS — M810 Age-related osteoporosis without current pathological fracture: Secondary | ICD-10-CM

## 2011-08-06 DIAGNOSIS — J4550 Severe persistent asthma, uncomplicated: Secondary | ICD-10-CM

## 2011-08-06 DIAGNOSIS — J45909 Unspecified asthma, uncomplicated: Secondary | ICD-10-CM

## 2011-08-06 NOTE — Progress Notes (Signed)
  Subjective:    Patient ID: [name] [name], female    DOB: 1941/08/21, 70 y.o.   MRN: [id number]  HPI Ms. Fillingim returns for follow-up. 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  She saw Dr. Russella Dar for change in Bowel habit and abdominal pain. She is started on levsin sl; she is for CT abd/pelvis 9/26 and for colonoscopy Oct 4th. Her liver functions were normal.  She had a normal mammogram Sept 13th. She had DXA with normal spine; osteopenia both femoral necks.  She had cataract surgery OD Sept 18th. Question of increased ocular pressure  She has been stable from a pulmonary perspective.     PMH, FamHx and SocHx reviewed for any changes and relevance.    Review of Systems System review is negative for any constitutional, cardiac, pulmonary, GI or neuro symptoms or complaints except as per HPI     Objective:   Physical Exam  Vitals - normal and BP good control HEENT - C&S clear, vision - good Cor - RRR Pulm - no increased WOB, coarse rhonchi but better than last visit, no wheezing          Assessment & Plan:  Lower abdominal pain - she has seen Dr. Russella Dar: possible IBS vs other. She is to have CT abd/pelvis, colonoscopy and she has been started on a trial of levsin. She prefers to pursue her other problems after this evaluation is complete.

## 2011-08-07 ENCOUNTER — Ambulatory Visit (INDEPENDENT_AMBULATORY_CARE_PROVIDER_SITE_OTHER)
Admission: RE | Admit: 2011-08-07 | Discharge: 2011-08-07 | Disposition: A | Discharge status: Home / Self Care | Payer: Medicare Other | Source: Ambulatory Visit | Attending: Gastroenterology | Admitting: Gastroenterology

## 2011-08-07 DIAGNOSIS — R198 Other specified symptoms and signs involving the digestive system and abdomen: Secondary | ICD-10-CM

## 2011-08-07 DIAGNOSIS — R109 Unspecified abdominal pain: Secondary | ICD-10-CM

## 2011-08-07 MED ORDER — IOHEXOL 300 MG/ML  SOLN: 100.0000 mL | Freq: Once | INTRAMUSCULAR | Status: AC | PRN

## 2011-08-08 NOTE — Assessment & Plan Note (Signed)
Respiratory status is stable at this visit. No change in regimen needed.

## 2011-08-08 NOTE — Assessment & Plan Note (Signed)
Patient continues to have some left arm pain that is intermittent. Plain films do suggest DDD as possible cause of her symptoms. MRI c-spine is the next study needed to establish any need for [facility] treatment.  Plan- patient has many other issues at this time and she is not prepared to move forward on this problem.

## 2011-08-08 NOTE — Assessment & Plan Note (Signed)
DXA completed: spine appears normal but there is osteopenia at the femoral necks. Given her high risk due to steroid use intermittently she is a candidate for medical therapy.  Plan - will discuss treatment options at the next office visit.

## 2011-08-12 ENCOUNTER — Encounter: Payer: Self-pay | Admitting: Internal Medicine

## 2011-08-12 MED ORDER — ALENDRONATE SODIUM 70 MG PO TABS: 70.0000 mg | ORAL_TABLET | ORAL | Status: DC

## 2011-08-15 ENCOUNTER — Other Ambulatory Visit: Payer: Medicare Other | Admitting: Gastroenterology

## 2011-09-12 ENCOUNTER — Encounter: Payer: Self-pay | Admitting: Internal Medicine

## 2011-09-12 ENCOUNTER — Ambulatory Visit (INDEPENDENT_AMBULATORY_CARE_PROVIDER_SITE_OTHER): Payer: Medicare Other | Admitting: Internal Medicine

## 2011-09-12 VITALS — BP 150/78 | HR 75 | Temp 98.3°F | Ht 64.0 in | Wt 185.0 lb

## 2011-09-12 DIAGNOSIS — J45909 Unspecified asthma, uncomplicated: Secondary | ICD-10-CM

## 2011-09-12 DIAGNOSIS — J4550 Severe persistent asthma, uncomplicated: Secondary | ICD-10-CM

## 2011-09-12 MED ORDER — PREDNISONE 10 MG PO TABS: ORAL_TABLET | ORAL | Status: DC

## 2011-09-12 MED ORDER — AZITHROMYCIN 250 MG PO TABS: ORAL_TABLET | ORAL | Status: AC

## 2011-09-12 NOTE — Patient Instructions (Addendum)
Your asthma appears active Take zpak Take prednisone 40 mg daily x 2 days, then 20mg daily x 2 days, then 10mg daily x 2 days, then 5mg daily x 2 days and then continue at 2.5mg per day I am not sure why your xolair was not started: please meet with my coordinator; we will aim to do half the frequency you used to do REturn in 2-3 months or sooner if needed  

## 2011-09-12 NOTE — Progress Notes (Signed)
Subjective:    Patient ID: [name] [name], female    DOB: 27-May-1941, 70 y.o.   MRN: [id number]  HPI 1. ASTHMA -  Baseline Chronic High Risk (multiple admits), Allergic, Moderate-Severe Persistent Asthma   - Allergies, gerd, sinus exacerbators.   -  Frequent prednisone -> daily low dose prednisone since FEb 20112 -  PFT 11/06/2010 - Mixed obstruction - restriction - fev1 1L/50%, Ratio 54, 24% BD response, TLC 3.6/78%, DLCO 16/75% - May 2012: Switched asthma care to Dr. Marchelle Gearing - June 2012 PFT - showed normalization with prednisone:  PFts 04/18/2011 shows NORMALIZATION (fev1 1.8L/93%, ratio 68, 9% BD response, DLCO 86).  - August 2012: Fev1: 1.1lL/58% -> prednisone burst-> 1.09L/60%, Ratio 65 (no  - Nov 2012: Fev1 0.97L/51%, Ratio 60   2. Bronchiectasis, (mild RUL) seen on CT chest - April 2012 ( ABPA wu tests on prednisone in June 2012: asp antibody IgE asp fumigatus 0.24 ku/L which is borderline/equivocal, IgG for all all aspergiluus preciptins - NEGATIVE. 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  Allergic rhinitis and allergic asthma.   - 2000s: - per hx strongly skin test positive at Dr Lucie Leather office  - 2004 - trialed xolair - per hx effective but developed rash subjectively attributed to twice daily dosing (chart review 2004-2005 states it was stopped due to subjective lack of improvement and an incidental rash resolution was noted in retrospect after stopping xolair)  - 2009: IgE 306 in July 2009. RAST positive for various grass, oak, elm, ragweed, plantain, lamb etc., s/p Allergy shot trial with Dr Maple Hudson - stopped due to transport issues - 2012: May - IgE 588.  - 2012: June - moved  allergy MD to Dr Aris Georgia  4. Lung nodule, 6 mm, on CT scan. - April 2012. Possibly calcified. RUL (no prior for comparison but a 07/17/2000 CT chest reports simillar size nodule in similar location). Repeat CT needed April 2013  5.   Gastroesophageal reflux disease with hx of UGI scope showing Hiatal hernia.     6. History of sleep apnea.- non compliant with CPAP   7. Significant anxiety (also mood lability hx with prednisone, worsening anxiety with albuterol)  8. Recurrent AE-ASthma -  Admitted 4/23-4/30/12 - OPD AE asthma Rx:  July 2012, august 2012  OV 09/12/11: Followup difficult asthma:  Spirometry today: Fev1 0.97L/51%, Ratio 60. Sick past 2 weeks. Increased wheeze and dyspnea and cough.  Rough summer. Yet to start xolair ? Why. Wants sleep apnea referral  PMHx 1.lower abd pain - Dr Russella Dar 2. Osteoporosis - Dr Debby Bud  Family hx and social hx:  - no change   Review of Systems  Constitutional: Negative for fever and unexpected weight change.  HENT: Negative for ear pain, nosebleeds, congestion, sore throat, rhinorrhea, sneezing, trouble swallowing, dental problem, postnasal drip and sinus pressure.   Eyes: Negative for redness and itching.  Respiratory: Positive for cough and shortness of breath. Negative for chest tightness and wheezing.   Cardiovascular: Negative for palpitations and leg swelling.  Gastrointestinal: Negative for nausea and vomiting.  Genitourinary: Negative for dysuria.  Musculoskeletal: Negative for joint swelling.  Skin: Negative for rash.  Neurological: Negative for headaches.  Hematological: Does not bruise/bleed easily.  Psychiatric/Behavioral: Negative for dysphoric mood. The patient is not nervous/anxious.        Objective:   Physical Exam GEN: A/Ox3; pleasant , NAD, well nourished   HEENT:  Edie/AT,  EACs-clear, TMs-wnl, NOSE-clear, THROAT-clear, no lesions, no postnasal drip or exudate noted.   NECK:  Supple w/ fair ROM; no JVD; normal carotid impulses w/o bruits; no thyromegaly or nodules palpated; no lymphadenopathy.  RESP  Coarse BS  WITH wheezes +.no accessory muscle use, no dullness to percussion  CARD:  RRR, no m/r/g  , no peripheral edema, pulses intact, no cyanosis  or clubbing.  GI:   Soft & nt; nml bowel sounds; no organomegaly or masses detected.  Musco: Warm bil, no deformities or joint swelling noted.   Neuro: alert, no focal deficits noted.    Skin: Warm, no lesions or rashes         Assessment & Plan:

## 2011-09-13 ENCOUNTER — Encounter: Payer: Self-pay | Admitting: Internal Medicine

## 2011-09-13 NOTE — Assessment & Plan Note (Signed)
Your asthma appears active Take zpak Take prednisone 40 mg daily x 2 days, then 20mg  daily x 2 days, then 10mg  daily x 2 days, then 5mg  daily x 2 days and then continue at 2.5mg  per day I am not sure why your xolair was not started: please meet with my coordinator; we will aim to do half the frequency you used to do REturn in 2-3 months or sooner if needed

## 2011-09-17 ENCOUNTER — Encounter (HOSPITAL_COMMUNITY)
Admission: RE | Admit: 2011-09-17 | Discharge: 2011-09-17 | Payer: Self-pay | Source: Ambulatory Visit | Attending: Internal Medicine | Admitting: Internal Medicine

## 2011-09-17 DIAGNOSIS — K219 Gastro-esophageal reflux disease without esophagitis: Secondary | ICD-10-CM | POA: Insufficient documentation

## 2011-09-17 DIAGNOSIS — M810 Age-related osteoporosis without current pathological fracture: Secondary | ICD-10-CM | POA: Insufficient documentation

## 2011-09-17 DIAGNOSIS — Z5189 Encounter for other specified aftercare: Secondary | ICD-10-CM | POA: Insufficient documentation

## 2011-09-17 DIAGNOSIS — G4730 Sleep apnea, unspecified: Secondary | ICD-10-CM | POA: Insufficient documentation

## 2011-09-17 DIAGNOSIS — J449 Chronic obstructive pulmonary disease, unspecified: Secondary | ICD-10-CM | POA: Insufficient documentation

## 2011-09-17 DIAGNOSIS — J4489 Other specified chronic obstructive pulmonary disease: Secondary | ICD-10-CM | POA: Insufficient documentation

## 2011-09-19 ENCOUNTER — Other Ambulatory Visit: Payer: Self-pay | Admitting: Internal Medicine

## 2011-09-19 ENCOUNTER — Encounter (HOSPITAL_COMMUNITY): Payer: Medicare Other

## 2011-09-19 ENCOUNTER — Other Ambulatory Visit: Payer: Self-pay | Admitting: *Deleted

## 2011-09-19 MED ORDER — OLOPATADINE HCL 0.1 % OP SOLN: 1.0000 [drp] | Freq: Every day | OPHTHALMIC | Status: DC

## 2011-09-20 ENCOUNTER — Encounter: Payer: Self-pay | Admitting: Gastroenterology

## 2011-09-24 ENCOUNTER — Encounter (HOSPITAL_COMMUNITY): Payer: Self-pay

## 2011-09-26 ENCOUNTER — Encounter (HOSPITAL_COMMUNITY)
Admission: RE | Admit: 2011-09-26 | Discharge: 2011-09-26 | Disposition: A | Discharge status: Home / Self Care | Payer: Self-pay | Source: Ambulatory Visit | Attending: Internal Medicine | Admitting: Internal Medicine

## 2011-10-01 ENCOUNTER — Encounter (HOSPITAL_COMMUNITY)
Admission: RE | Admit: 2011-10-01 | Discharge: 2011-10-01 | Disposition: A | Discharge status: Home / Self Care | Payer: Self-pay | Source: Ambulatory Visit | Attending: Internal Medicine | Admitting: Internal Medicine

## 2011-10-03 ENCOUNTER — Encounter (HOSPITAL_COMMUNITY): Payer: Medicare Other

## 2011-10-08 ENCOUNTER — Encounter (HOSPITAL_COMMUNITY)
Admission: RE | Admit: 2011-10-08 | Discharge: 2011-10-08 | Disposition: A | Discharge status: Home / Self Care | Payer: Self-pay | Source: Ambulatory Visit | Attending: Internal Medicine | Admitting: Internal Medicine

## 2011-10-09 ENCOUNTER — Telehealth: Payer: Self-pay | Admitting: *Deleted

## 2011-10-09 ENCOUNTER — Ambulatory Visit (AMBULATORY_SURGERY_CENTER): Payer: Medicare Other | Admitting: *Deleted

## 2011-10-09 VITALS — Ht 62.0 in | Wt 188.2 lb

## 2011-10-09 DIAGNOSIS — R109 Unspecified abdominal pain: Secondary | ICD-10-CM

## 2011-10-09 MED ORDER — MOVIPREP 100 G PO SOLR: ORAL | Status: DC

## 2011-10-09 NOTE — Telephone Encounter (Signed)
OK for SUPREP

## 2011-10-09 NOTE — Telephone Encounter (Signed)
Pt. all ready has SUPREP sample from prior cx. Colon.May she use it instead of Movi pre?

## 2011-10-10 ENCOUNTER — Encounter (HOSPITAL_COMMUNITY)
Admission: RE | Admit: 2011-10-10 | Discharge: 2011-10-10 | Disposition: A | Discharge status: Home / Self Care | Payer: Self-pay | Source: Ambulatory Visit | Attending: Internal Medicine | Admitting: Internal Medicine

## 2011-10-10 ENCOUNTER — Telehealth: Payer: Self-pay

## 2011-10-10 NOTE — Telephone Encounter (Signed)
Left message

## 2011-10-15 ENCOUNTER — Encounter (HOSPITAL_COMMUNITY)
Admission: RE | Admit: 2011-10-15 | Discharge: 2011-10-15 | Disposition: A | Discharge status: Home / Self Care | Payer: Self-pay | Source: Ambulatory Visit | Attending: Internal Medicine | Admitting: Internal Medicine

## 2011-10-15 DIAGNOSIS — M810 Age-related osteoporosis without current pathological fracture: Secondary | ICD-10-CM | POA: Insufficient documentation

## 2011-10-15 DIAGNOSIS — G4730 Sleep apnea, unspecified: Secondary | ICD-10-CM | POA: Insufficient documentation

## 2011-10-15 DIAGNOSIS — J4489 Other specified chronic obstructive pulmonary disease: Secondary | ICD-10-CM | POA: Insufficient documentation

## 2011-10-15 DIAGNOSIS — K219 Gastro-esophageal reflux disease without esophagitis: Secondary | ICD-10-CM | POA: Insufficient documentation

## 2011-10-15 DIAGNOSIS — J449 Chronic obstructive pulmonary disease, unspecified: Secondary | ICD-10-CM | POA: Insufficient documentation

## 2011-10-15 DIAGNOSIS — Z5189 Encounter for other specified aftercare: Secondary | ICD-10-CM | POA: Insufficient documentation

## 2011-10-17 ENCOUNTER — Encounter (HOSPITAL_COMMUNITY)
Admission: RE | Admit: 2011-10-17 | Discharge: 2011-10-17 | Disposition: A | Discharge status: Home / Self Care | Payer: Self-pay | Source: Ambulatory Visit | Attending: Internal Medicine | Admitting: Internal Medicine

## 2011-10-18 ENCOUNTER — Other Ambulatory Visit: Payer: Self-pay | Admitting: Internal Medicine

## 2011-10-21 ENCOUNTER — Other Ambulatory Visit: Payer: Self-pay

## 2011-10-21 ENCOUNTER — Inpatient Hospital Stay (HOSPITAL_COMMUNITY)
Admission: AD | Admit: 2011-10-21 | Discharge: 2011-10-28 | DRG: 203 | Disposition: A | Discharge status: Home Health | Payer: Medicare Other | Source: Ambulatory Visit | Attending: Internal Medicine | Admitting: Internal Medicine

## 2011-10-21 ENCOUNTER — Encounter (HOSPITAL_COMMUNITY): Payer: Self-pay | Admitting: General Practice

## 2011-10-21 ENCOUNTER — Encounter: Payer: Self-pay | Admitting: Internal Medicine

## 2011-10-21 ENCOUNTER — Inpatient Hospital Stay (HOSPITAL_COMMUNITY): Payer: Medicare Other

## 2011-10-21 ENCOUNTER — Ambulatory Visit (INDEPENDENT_AMBULATORY_CARE_PROVIDER_SITE_OTHER): Payer: Medicare Other | Admitting: Internal Medicine

## 2011-10-21 VITALS — BP 144/82 | HR 85 | Temp 98.1°F | Ht 64.0 in | Wt 187.0 lb

## 2011-10-21 DIAGNOSIS — N289 Disorder of kidney and ureter, unspecified: Secondary | ICD-10-CM | POA: Diagnosis present

## 2011-10-21 DIAGNOSIS — Z882 Allergy status to sulfonamides status: Secondary | ICD-10-CM

## 2011-10-21 DIAGNOSIS — R5383 Other fatigue: Secondary | ICD-10-CM | POA: Diagnosis present

## 2011-10-21 DIAGNOSIS — Z87891 Personal history of nicotine dependence: Secondary | ICD-10-CM

## 2011-10-21 DIAGNOSIS — Z888 Allergy status to other drugs, medicaments and biological substances status: Secondary | ICD-10-CM

## 2011-10-21 DIAGNOSIS — M810 Age-related osteoporosis without current pathological fracture: Secondary | ICD-10-CM | POA: Diagnosis present

## 2011-10-21 DIAGNOSIS — J45902 Unspecified asthma with status asthmaticus: Principal | ICD-10-CM | POA: Diagnosis present

## 2011-10-21 DIAGNOSIS — J4550 Severe persistent asthma, uncomplicated: Secondary | ICD-10-CM

## 2011-10-21 DIAGNOSIS — R5381 Other malaise: Secondary | ICD-10-CM | POA: Diagnosis present

## 2011-10-21 DIAGNOSIS — I10 Essential (primary) hypertension: Secondary | ICD-10-CM | POA: Diagnosis present

## 2011-10-21 DIAGNOSIS — Z91040 Latex allergy status: Secondary | ICD-10-CM

## 2011-10-21 DIAGNOSIS — J45909 Unspecified asthma, uncomplicated: Secondary | ICD-10-CM

## 2011-10-21 DIAGNOSIS — E669 Obesity, unspecified: Secondary | ICD-10-CM | POA: Diagnosis present

## 2011-10-21 DIAGNOSIS — Z79899 Other long term (current) drug therapy: Secondary | ICD-10-CM

## 2011-10-21 DIAGNOSIS — Z881 Allergy status to other antibiotic agents status: Secondary | ICD-10-CM

## 2011-10-21 DIAGNOSIS — F411 Generalized anxiety disorder: Secondary | ICD-10-CM | POA: Diagnosis present

## 2011-10-21 DIAGNOSIS — IMO0002 Reserved for concepts with insufficient information to code with codable children: Secondary | ICD-10-CM

## 2011-10-21 DIAGNOSIS — E785 Hyperlipidemia, unspecified: Secondary | ICD-10-CM | POA: Diagnosis present

## 2011-10-21 DIAGNOSIS — R0602 Shortness of breath: Secondary | ICD-10-CM

## 2011-10-21 DIAGNOSIS — J479 Bronchiectasis, uncomplicated: Secondary | ICD-10-CM | POA: Diagnosis present

## 2011-10-21 DIAGNOSIS — Z880 Allergy status to penicillin: Secondary | ICD-10-CM

## 2011-10-21 DIAGNOSIS — G4733 Obstructive sleep apnea (adult) (pediatric): Secondary | ICD-10-CM | POA: Diagnosis present

## 2011-10-21 HISTORY — DX: Chronic obstructive pulmonary disease, unspecified: J44.9

## 2011-10-21 HISTORY — DX: Essential (primary) hypertension: I10

## 2011-10-21 HISTORY — DX: Unspecified asthma, uncomplicated: J45.909

## 2011-10-21 HISTORY — DX: Anxiety disorder, unspecified: F41.9

## 2011-10-21 LAB — DIFFERENTIAL
Basophils Absolute: 0.1 10*3/uL (ref 0.0–0.1)
Eosinophils Relative: 7 % — ABNORMAL HIGH (ref 0–5)
Lymphocytes Relative: 17 % (ref 12–46)
Neutro Abs: 6.6 10*3/uL (ref 1.7–7.7)

## 2011-10-21 LAB — COMPREHENSIVE METABOLIC PANEL
AST: 23 U/L (ref 0–37)
Albumin: 4 g/dL (ref 3.5–5.2)
Calcium: 9.6 mg/dL (ref 8.4–10.5)
Creatinine, Ser: 1.4 mg/dL — ABNORMAL HIGH (ref 0.50–1.10)

## 2011-10-21 LAB — PRO B NATRIURETIC PEPTIDE: Pro B Natriuretic peptide (BNP): 14.1 pg/mL (ref 0–125)

## 2011-10-21 LAB — CBC
Platelets: 251 10*3/uL (ref 150–400)
RDW: 13.3 % (ref 11.5–15.5)
WBC: 9.4 10*3/uL (ref 4.0–10.5)

## 2011-10-21 LAB — CARDIAC PANEL(CRET KIN+CKTOT+MB+TROPI): Relative Index: 2.3 (ref 0.0–2.5)

## 2011-10-21 LAB — PROCALCITONIN: Procalcitonin: <0.1 ng/mL

## 2011-10-21 LAB — LACTIC ACID, PLASMA: Lactic Acid, Venous: 1 mmol/L (ref 0.5–2.2)

## 2011-10-21 MED ORDER — OLOPATADINE HCL 0.1 % OP SOLN
1.0000 [drp] | Freq: Two times a day (BID) | OPHTHALMIC | Status: DC
  Administered 2011-10-21 – 2011-10-28 (×14): 1 [drp] via OPHTHALMIC
  Filled 2011-10-21 (×2): qty 5

## 2011-10-21 MED ORDER — LEVALBUTEROL HCL 0.63 MG/3ML IN NEBU: 0.6300 mg | INHALATION_SOLUTION | Freq: Once | RESPIRATORY_TRACT | Status: DC

## 2011-10-21 MED ORDER — LEVALBUTEROL HCL 0.63 MG/3ML IN NEBU
0.6300 mg | INHALATION_SOLUTION | Freq: Once | RESPIRATORY_TRACT | Status: AC
  Administered 2011-10-21: 0.63 mg via RESPIRATORY_TRACT

## 2011-10-21 MED ORDER — CYCLOSPORINE 0.05 % OP EMUL
1.0000 [drp] | Freq: Two times a day (BID) | OPHTHALMIC | Status: DC
  Administered 2011-10-21 – 2011-10-25 (×9): 1 [drp] via OPHTHALMIC
  Administered 2011-10-26 (×2): via OPHTHALMIC
  Administered 2011-10-27: 1 [drp] via OPHTHALMIC
  Administered 2011-10-27: 10:00:00 via OPHTHALMIC
  Administered 2011-10-28: 1 [drp] via OPHTHALMIC
  Filled 2011-10-21 (×15): qty 1

## 2011-10-21 MED ORDER — CHLORHEXIDINE GLUCONATE 0.12 % MT SOLN
15.0000 mL | Freq: Two times a day (BID) | OROMUCOSAL | Status: DC
  Administered 2011-10-22 – 2011-10-28 (×3): 15 mL via OROMUCOSAL
  Filled 2011-10-21 (×17): qty 15

## 2011-10-21 MED ORDER — METHYLPREDNISOLONE ACETATE 80 MG/ML IJ SUSP
80.0000 mg | Freq: Once | INTRAMUSCULAR | Status: AC
  Administered 2011-10-21: 80 mg via INTRAMUSCULAR

## 2011-10-21 MED ORDER — HYDROCODONE-ACETAMINOPHEN 5-325 MG PO TABS
1.5000 | ORAL_TABLET | Freq: Four times a day (QID) | ORAL | Status: DC | PRN
  Filled 2011-10-21: qty 1

## 2011-10-21 MED ORDER — HYDROCODONE-ACETAMINOPHEN 5-500 MG PO TABS: 1.5000 | ORAL_TABLET | Freq: Four times a day (QID) | ORAL | Status: DC | PRN

## 2011-10-21 MED ORDER — HYDROCHLOROTHIAZIDE 25 MG PO TABS
25.0000 mg | ORAL_TABLET | Freq: Every day | ORAL | Status: DC
  Administered 2011-10-22 – 2011-10-28 (×7): 25 mg via ORAL
  Filled 2011-10-21 (×8): qty 1

## 2011-10-21 MED ORDER — PANTOPRAZOLE SODIUM 40 MG PO TBEC
40.0000 mg | DELAYED_RELEASE_TABLET | Freq: Every day | ORAL | Status: DC
  Filled 2011-10-21: qty 1

## 2011-10-21 MED ORDER — MONTELUKAST SODIUM 10 MG PO TABS
10.0000 mg | ORAL_TABLET | Freq: Every day | ORAL | Status: DC
  Administered 2011-10-22 – 2011-10-27 (×6): 10 mg via ORAL
  Filled 2011-10-21 (×8): qty 1

## 2011-10-21 MED ORDER — LEVALBUTEROL HCL 0.63 MG/3ML IN NEBU
0.6300 mg | INHALATION_SOLUTION | RESPIRATORY_TRACT | Status: DC
  Administered 2011-10-21 – 2011-10-23 (×10): 0.63 mg via RESPIRATORY_TRACT
  Filled 2011-10-21 (×17): qty 3

## 2011-10-21 MED ORDER — AMLODIPINE BESYLATE 5 MG PO TABS
5.0000 mg | ORAL_TABLET | Freq: Every day | ORAL | Status: DC
  Administered 2011-10-22 – 2011-10-28 (×7): 5 mg via ORAL
  Filled 2011-10-21 (×8): qty 1

## 2011-10-21 MED ORDER — WHITE PETROLATUM GEL
Status: AC
  Administered 2011-10-21 – 2011-10-23 (×2)
  Filled 2011-10-21: qty 5

## 2011-10-21 MED ORDER — SODIUM CHLORIDE 0.9 % IV SOLN
INTRAVENOUS | Status: DC
  Administered 2011-10-21 – 2011-10-22 (×3): via INTRAVENOUS

## 2011-10-21 MED ORDER — BIOTENE DRY MOUTH MT LIQD
15.0000 mL | Freq: Two times a day (BID) | OROMUCOSAL | Status: DC
  Administered 2011-10-21 – 2011-10-27 (×9): 15 mL via OROMUCOSAL

## 2011-10-21 MED ORDER — PANTOPRAZOLE SODIUM 40 MG PO TBEC: 40.0000 mg | DELAYED_RELEASE_TABLET | Freq: Every day | ORAL | Status: DC

## 2011-10-21 MED ORDER — MOXIFLOXACIN HCL IN NACL 400 MG/250ML IV SOLN
400.0000 mg | INTRAVENOUS | Status: DC
  Administered 2011-10-21 – 2011-10-23 (×3): 400 mg via INTRAVENOUS
  Filled 2011-10-21 (×3): qty 250

## 2011-10-21 MED ORDER — ALPRAZOLAM 0.5 MG PO TABS: 0.5000 mg | ORAL_TABLET | Freq: Four times a day (QID) | ORAL | Status: DC | PRN

## 2011-10-21 MED ORDER — IPRATROPIUM BROMIDE 0.02 % IN SOLN
0.5000 mg | RESPIRATORY_TRACT | Status: DC
  Administered 2011-10-21 – 2011-10-23 (×10): 0.5 mg via RESPIRATORY_TRACT
  Filled 2011-10-21 (×10): qty 2.5

## 2011-10-21 MED ORDER — METHYLPREDNISOLONE SODIUM SUCC 125 MG IJ SOLR
80.0000 mg | Freq: Three times a day (TID) | INTRAMUSCULAR | Status: DC
  Administered 2011-10-21 – 2011-10-22 (×4): 80 mg via INTRAVENOUS
  Filled 2011-10-21 (×2): qty 1.28
  Filled 2011-10-21: qty 2
  Filled 2011-10-21 (×2): qty 1.28
  Filled 2011-10-21: qty 2
  Filled 2011-10-21: qty 1.28

## 2011-10-21 NOTE — Assessment & Plan Note (Signed)
Status asthmaticus - worst fev1 ever so far Xopenex and 80mg  IM depot medrol in office In hospital - xopenex and atrovent scheduled Q4h and IV solumedrol and antibiotic IV Do stat labs Her special requests - No daily labs except admit. No mucinex. No heparin/lovenox (SCD okay). No sleeping pills

## 2011-10-21 NOTE — Progress Notes (Signed)
Subjective:    Patient ID: [name] [name], female    DOB: December 18, 1940, 70 y.o.   MRN: [id number]  HPI  1. ASTHMA -  Baseline Chronic High Risk (multiple admits), Allergic, Moderate-Severe Persistent Asthma   - Allergies, gerd, sinus exacerbators.   -  Frequent prednisone -> daily low dose prednisone since FEb 20112 -  PFT 11/06/2010 - Mixed obstruction - restriction - fev1 1L/50%, Ratio 54, 24% BD response, TLC 3.6/78%, DLCO 16/75% - May 2012: Switched asthma care to Dr. Marchelle Gearing - June 2012 PFT - showed normalization with prednisone:  PFts 04/18/2011 shows NORMALIZATION (fev1 1.8L/93%, ratio 68, 9% BD response, DLCO 86).  - August 2012: Fev1: 1.1lL/58% -> prednisone burst-> 1.09L/60%, Ratio 65 (no  - Nov 2012: Fev1 0.97L/51%, Ratio 60 -> pred burst - Dec 2012: Fev1 0.7L/37%, Ratio 67%   2. Bronchiectasis, (mild RUL) seen on CT chest - April 2012 ( ABPA wu tests on prednisone in June 2012: asp antibody IgE asp fumigatus 0.24 ku/L which is borderline/equivocal, IgG for all all aspergiluus preciptins - NEGATIVE. 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  Allergic rhinitis and allergic asthma.   - 2000s: - per hx strongly skin test positive at Dr Lucie Leather office  - 2004 - trialed xolair - per hx effective but developed rash which she subjectively attributed to twice daily dosing (thogh chart review 2004-2005 states it was stopped due to subjective lack of improvement and an incidental rash resolution was noted in retrospect after stopping xolair)  - 2009: IgE 306 in July 2009. RAST positive for various grass, oak, elm, ragweed, plantain, lamb etc., s/p Allergy shot trial with Dr Maple Hudson - stopped due to transport issues - 2012: May - IgE 588.  - 2012: June - moved  allergy MD to Dr Aris Georgia  4. Lung nodule, 6 mm, on CT scan. - April 2012. Possibly calcified. RUL (no prior for comparison but a 07/17/2000 CT chest reports simillar size  nodule in similar location). Repeat CT needed April 2013  5.  Gastroesophageal reflux disease with hx of UGI scope showing Hiatal hernia.     6. History of sleep apnea.- non compliant with CPAP   7. Significant anxiety (also mood lability hx with prednisone, worsening anxiety with albuterol)  8. Recurrent AE-ASthma -  Admitted 4/23-4/30/12 - OPD AE asthma Rx:  July 2012, august 2012, Nov 2012  OV 10/21/2011 Followup difficult asthma. Yet to start xolair; waiting on medicaid pre-cert. Did pred taper after last visit and back to baseline prednisone dose now. Past week increased cough, shortness of breath, wheeze, nocturanl awakening. Rates symptoms as severe. Feels miserable. Denies fever, sputum, edema, chest pain. Fev1 0.7L/37%, Ratio 67% and worst ever.   Social, Past, Family: no change  Past Medical History  Diagnosis Date  . Sleep apnea   . ALLERGIC RHINITIS   . Colon polyp, hyperplastic 02/2003  . Chronic rhinosinusitis   . Hyperlipidemia   . Acute bronchitis   . Right hip pain   . Left knee pain   . Osteoporosis   . GERD (gastroesophageal reflux disease)   . Chronic obstructive asthma     PFT 11/06/10 - FEV1 1.24/ 0.62; FEV1/FVC 0.56, TLC 0.78; DLCO 0.75  . Hiatal hernia   . Helicobacter pylori gastritis 02/2009    partially treated  . Gastroparesis 2010     Family History  Problem Relation Age of Onset  . Colon  cancer Neg Hx      History   Social History  . Marital Status: Divorced    Spouse Name: N/A    Number of Children: 4  . Years of Education: N/A   Occupational History  . disabled     [employer]  .     Social History Main Topics  . Smoking status: Former Smoker -- 3 years    Types: Cigarettes    Quit date: 11/11/1962  . Smokeless tobacco: Never Used  . Alcohol Use: No  . Drug Use: No  . Sexually Active: Not on file   Other Topics Concern  . Not on file   Social History Narrative   4 brothers4 sistersPt gets regular  exerciseMoved in with daughter      Allergies  Allergen Reactions  . Latex   . Diclofenac Sodium     REACTION: Hives  . Doxycycline Nausea And Vomiting  . Penicillins     Tongue swelling  . Spiriva Handihaler     Tongue/mouth itching Dysuria   . Sulfonamide Derivatives     Tongue swelling   . Albuterol Anxiety    Switched to xopenex     Outpatient Prescriptions Prior to Visit  Medication Sig Dispense Refill  . ALPRAZolam (XANAX) 0.5 MG tablet Take 1 tablet (0.5 mg total) by mouth every 6 (six) hours as needed.  90 tablet  5  . amLODipine (NORVASC) 5 MG tablet Take 5 mg by mouth daily. BMN      . budesonide-formoterol (SYMBICORT) 160-4.5 MCG/ACT inhaler Inhale 2 puffs into the lungs 2 (two) times daily. Rinse mouth  1 Inhaler  prn  . cycloSPORINE (RESTASIS) 0.05 % ophthalmic emulsion Place 1 drop into both eyes 2 (two) times daily. BMN      . desloratadine (CLARINEX) 5 MG tablet Take 5 mg by mouth daily. BMN       . EPINEPHrine (EPIPEN) 0.3 mg/0.3 mL DEVI Use as directed for severe allergic reaction       . hydrochlorothiazide (HYDRODIURIL) 25 MG tablet take 1 tablet by mouth once daily  30 tablet  12  . HYDROcodone-acetaminophen (VICODIN ES) 7.5-750 MG per tablet Take 1 tablet by mouth every 6 (six) hours as needed. BMN  100 tablet  2  . levalbuterol (XOPENEX HFA) 45 MCG/ACT inhaler Inhale 2 puffs into the lungs every 4 (four) hours as needed. BMN       . levalbuterol (XOPENEX) 0.63 MG/3ML nebulizer solution Take 1 ampule by nebulization every 4 (four) hours as needed. BMN       . Na Sulfate-K Sulfate-Mg Sulf SOLN Use 1 kit as directed  1 Bottle  0  . NEXIUM 40 MG capsule take 1 capsule by mouth twice a day  60 capsule  12  . PATANOL 0.1 % ophthalmic solution instill 2 drop into both eyes twice a day  5 mL  5  . predniSONE (DELTASONE) 5 MG tablet Take 0.5 tablets (2.5 mg total) by mouth daily. 1-2 tabs daily for long term control of asthma or as directed.  100 tablet  3  .  SINGULAIR 10 MG tablet Take 1 tablet (10 mg total) by mouth daily. BRAND NAME ONLY  90 tablet  3  . MOVIPREP 100 G SOLR Movi prep as directed  1 each  0  . alendronate (FOSAMAX) 70 MG tablet Take 1 tablet (70 mg total) by mouth every 7 (seven) days. Take with a full glass of water on an  empty stomach.  4 tablet  11  . hyoscyamine (LEVSIN) 0.125 MG tablet Take 1-2 tablets by mouth before meals  90 tablet  5  . predniSONE (DELTASONE) 10 MG tablet Take 4 tablets for 2 days, then 2 tablets for 2 days, 1 tablet for 2 days, then 1/2 tablet for 2 days, then continue 2.5mg  per day.  16 tablet  0  . predniSONE (DELTASONE) 5 MG tablet Take 5 mg by mouth daily.        Marland Kitchen tiotropium (SPIRIVA) 18 MCG inhalation capsule Place 18 mcg into inhaler and inhale daily.             Review of Systems  Constitutional: Negative for fever and unexpected weight change.  HENT: Positive for nosebleeds, congestion, rhinorrhea and sinus pressure. Negative for ear pain, sore throat, sneezing, trouble swallowing, dental problem and postnasal drip.   Eyes: Negative for redness and itching.  Respiratory: Positive for cough, chest tightness, shortness of breath and wheezing.   Cardiovascular: Negative for palpitations and leg swelling.  Gastrointestinal: Positive for nausea, vomiting and diarrhea.  Genitourinary: Negative for dysuria.  Musculoskeletal: Negative for joint swelling.  Skin: Positive for rash.  Neurological: Positive for headaches.  Hematological: Does not bruise/bleed easily.  Psychiatric/Behavioral: Negative for dysphoric mood. The patient is not nervous/anxious.        Objective:   Physical Exam  Vitals reviewed. Constitutional: She is oriented to person, place, and time. She appears well-developed and well-nourished. No distress.  HENT:  Head: Normocephalic and atraumatic.  Right Ear: External ear normal.  Left Ear: External ear normal.  Mouth/Throat: Oropharynx is clear and moist. No oropharyngeal  exudate.  Eyes: Conjunctivae and EOM are normal. Pupils are equal, round, and reactive to light. Right eye exhibits no discharge. Left eye exhibits no discharge. No scleral icterus.  Neck: Normal range of motion. Neck supple. No JVD present. No tracheal deviation present. No thyromegaly present.  Cardiovascular: Normal rate, regular rhythm, normal heart sounds and intact distal pulses.  Exam reveals no gallop and no friction rub.   No murmur heard. Pulmonary/Chest: Effort normal and breath sounds normal. No respiratory distress. She has no wheezes. She has no rales. She exhibits no tenderness.       Somewhat visibly dyspneic and audible wheeze +  Abdominal: Soft. Bowel sounds are normal. She exhibits no distension and no mass. There is no tenderness. There is no rebound and no guarding.  Musculoskeletal: Normal range of motion. She exhibits no edema and no tenderness.  Lymphadenopathy:    She has no cervical adenopathy.  Neurological: She is alert and oriented to person, place, and time. She has normal reflexes. No cranial nerve deficit. She exhibits normal muscle tone. Coordination normal.  Skin: Skin is warm and dry. No rash noted. She is not diaphoretic. No erythema. No pallor.  Psychiatric: Judgment and thought content normal.       In tears          Assessment & Plan:

## 2011-10-21 NOTE — Patient Instructions (Addendum)
Status asthmaticus Xopenex and 80mg  IM depot medrol in office In hospital - xopenex and atrovent scheduled Q4h and IV solumedrol and antibiotic IV Do stat labs Her special requests - No daily labs except admit. No mucinex. No heparin/lovenox (SCD okay). No sleeping pills
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
## 2011-10-21 NOTE — Plan of Care (Signed)
Problem: Phase I Progression Outcomes Goal: Dyspnea controlled at rest Outcome: Progressing Some SOB at rest with O2 on.

## 2011-10-22 ENCOUNTER — Encounter (HOSPITAL_COMMUNITY): Payer: Self-pay

## 2011-10-22 DIAGNOSIS — N289 Disorder of kidney and ureter, unspecified: Secondary | ICD-10-CM | POA: Diagnosis present

## 2011-10-22 DIAGNOSIS — N179 Acute kidney failure, unspecified: Secondary | ICD-10-CM

## 2011-10-22 DIAGNOSIS — J45902 Unspecified asthma with status asthmaticus: Secondary | ICD-10-CM

## 2011-10-22 MED ORDER — HYDROCODONE-ACETAMINOPHEN 5-325 MG PO TABS
0.5000 | ORAL_TABLET | Freq: Four times a day (QID) | ORAL | Status: DC | PRN
  Administered 2011-10-22: 0.5 via ORAL
  Filled 2011-10-22: qty 1

## 2011-10-22 MED ORDER — MONTELUKAST SODIUM 10 MG PO TABS
10.0000 mg | ORAL_TABLET | Freq: Once | ORAL | Status: DC
  Filled 2011-10-22: qty 1

## 2011-10-22 MED ORDER — METHYLPREDNISOLONE SODIUM SUCC 125 MG IJ SOLR
80.0000 mg | Freq: Two times a day (BID) | INTRAMUSCULAR | Status: DC
  Administered 2011-10-23: 80 mg via INTRAVENOUS
  Filled 2011-10-22 (×2): qty 1.28
  Filled 2011-10-22: qty 2

## 2011-10-22 MED ORDER — WHITE PETROLATUM GEL
Status: AC
  Administered 2011-10-22: 13:00:00
  Filled 2011-10-22: qty 5

## 2011-10-22 NOTE — Progress Notes (Signed)
Cc: followup status asthma  Subjective: Much better,. Feels improved wheeze. However, admist to inabilty to cough up sputum. Feels jittery with solumedrol. BUt overall better. Wants flutter valve. She is concerrned about rising creatinine  ROS Bp running high  Objective: Vital signs in last 24 hours: Temp:  [97.5 F (36.4 C)-98.3 F (36.8 C)] 97.9 F (36.6 C) (12/11 1419) Pulse Rate:  [69-81] 81  (12/11 1419) Resp:  [18-22] 18  (12/11 1419) BP: (134-181)/(74-81) 181/81 mmHg (12/11 1419) SpO2:  [94 %-100 %] 98 % (12/11 1500) Weight change:    Intake/Output from previous day: 12/10 0701 - 12/11 0700 In: 1335 [P.O.:500; I.V.:585; IV Piggyback:250] Out: 4 [Urine:4] Intake/Output this shift: Total I/O In: 240 [P.O.:240] Out: 1 [Urine:1]    PHYSICAL EXAM Gen: looks better Psych: cheerful. Not tearful HEENT: some audible wheeze but better. Supple neck. No neck nodes. No elevated JVP RESP: Less resp distress. Almost normal. No wheeze. No crackles CVS: S1S2+. No murmur PA: obese, soft, no mass EXT: No chyanosi, No clubbing. No edema SKIN: intact   Lab Results:  Basename 10/21/11 1551  WBC 9.4  HGB 12.0  HCT 37.3  PLT 251   BMET  Basename 10/21/11 1551  NA 139  K 3.8  CL 104  CO2 23  GLUCOSE 107*  BUN 19  CREATININE 1.40*  CALCIUM 9.6   Lab Results  Component Value Date   CREATININE 1.40* 10/21/2011     Studies/Results: Portable Chest Xray  10/21/2011  *RADIOLOGY REPORT*  Clinical Data: Status asthmaticus.  Shortness of breath.  PORTABLE CHEST - 1 VIEW  Comparison: 05/28/2011  Findings: Heart size and vascularity are normal.  Peribronchial thickening consistent with bronchitis.  No infiltrates or effusions.  No acute osseous abnormality.  IMPRESSION: Mild bronchitic changes.  Original Report Authenticated By: Gwynn Burly, M.D.    Medications: I have reviewed the patient's current medications.  Assessment/Plan: #STATUS ASTHMA  -  IMproved  PLAn  cut solumedrol dose down  change to pred next 24-48h Check spirometry tomorrow 10/23/11 Do flutter valve Continue xopenex and atrovent Continue avelox   #Acute renal insuff  -getting fluids PLAN rechec creat 10/23/11  #BP  - monitor severity with steroid wean   LOS: 1 day   Alanah Sakuma 10/22/2011, 6:00 PM

## 2011-10-22 NOTE — Progress Notes (Signed)
Pt was complaining of chest pain. Doctor Tyson Alias was notified and stated to just keep monitoring the pt. Gwynne Edinger, RN

## 2011-10-22 NOTE — Progress Notes (Signed)
eLink Physician-Brief Progress Note Patient Name: [name] [name] DOB: 21-Jan-1941 MRN: [id number]  Date of Service  10/22/2011   HPI/Events of Note   Chronic NONE acute chest pain , she does not want to take 1.5 tab perc  eICU Interventions  Changed dose to 0.5, spoke to RN, asked to call if Chest pain changed in character. She describe related to breathing pattrn   Intervention Category Minor Interventions: Routine modifications to care plan (e.g. PRN medications for pain, fever)  Nelda Bucks. 10/22/2011, 1:47 AM

## 2011-10-23 ENCOUNTER — Inpatient Hospital Stay (HOSPITAL_COMMUNITY): Payer: Medicare Other

## 2011-10-23 ENCOUNTER — Other Ambulatory Visit: Payer: Medicare Other | Admitting: Gastroenterology

## 2011-10-23 ENCOUNTER — Other Ambulatory Visit (HOSPITAL_COMMUNITY): Payer: Self-pay | Admitting: Radiology

## 2011-10-23 DIAGNOSIS — I10 Essential (primary) hypertension: Secondary | ICD-10-CM

## 2011-10-23 DIAGNOSIS — N179 Acute kidney failure, unspecified: Secondary | ICD-10-CM

## 2011-10-23 DIAGNOSIS — J45902 Unspecified asthma with status asthmaticus: Secondary | ICD-10-CM

## 2011-10-23 LAB — CBC
HCT: 33.8 % — ABNORMAL LOW (ref 36.0–46.0)
Hemoglobin: 10.6 g/dL — ABNORMAL LOW (ref 12.0–15.0)
MCH: 27.3 pg (ref 26.0–34.0)
MCHC: 31.4 g/dL (ref 30.0–36.0)
MCV: 87.1 fL (ref 78.0–100.0)
Platelets: 234 K/uL (ref 150–400)
RBC: 3.88 MIL/uL (ref 3.87–5.11)
RDW: 13.3 % (ref 11.5–15.5)
WBC: 14.3 K/uL — ABNORMAL HIGH (ref 4.0–10.5)

## 2011-10-23 LAB — MAGNESIUM: Magnesium: 2 mg/dL (ref 1.5–2.5)

## 2011-10-23 LAB — PHOSPHORUS: Phosphorus: 3.3 mg/dL (ref 2.3–4.6)

## 2011-10-23 LAB — BASIC METABOLIC PANEL
CO2: 23 mEq/L (ref 19–32)
Chloride: 106 mEq/L (ref 96–112)
Potassium: 4.1 mEq/L (ref 3.5–5.1)
Sodium: 141 mEq/L (ref 135–145)

## 2011-10-23 MED ORDER — AZELASTINE HCL 0.1 % NA SOLN
2.0000 | Freq: Two times a day (BID) | NASAL | Status: DC
  Administered 2011-10-23: 2 via NASAL
  Filled 2011-10-23: qty 30

## 2011-10-23 MED ORDER — LEVALBUTEROL HCL 0.63 MG/3ML IN NEBU
0.6300 mg | INHALATION_SOLUTION | RESPIRATORY_TRACT | Status: DC | PRN
  Administered 2011-10-24 – 2011-10-25 (×3): 0.63 mg via RESPIRATORY_TRACT
  Filled 2011-10-23: qty 3

## 2011-10-23 MED ORDER — PREDNISONE 20 MG PO TABS
40.0000 mg | ORAL_TABLET | Freq: Every day | ORAL | Status: DC
  Administered 2011-10-23 – 2011-10-28 (×6): 40 mg via ORAL
  Filled 2011-10-23 (×7): qty 2

## 2011-10-23 MED ORDER — WHITE PETROLATUM GEL
Status: AC
  Filled 2011-10-23: qty 5

## 2011-10-23 MED ORDER — ALBUTEROL SULFATE (5 MG/ML) 0.5% IN NEBU
2.5000 mg | INHALATION_SOLUTION | Freq: Once | RESPIRATORY_TRACT | Status: AC
  Administered 2011-10-23: 2.5 mg via RESPIRATORY_TRACT

## 2011-10-23 MED ORDER — PANTOPRAZOLE SODIUM 40 MG PO TBEC
80.0000 mg | DELAYED_RELEASE_TABLET | Freq: Every day | ORAL | Status: DC
  Filled 2011-10-23: qty 2

## 2011-10-23 MED ORDER — BUDESONIDE-FORMOTEROL FUMARATE 160-4.5 MCG/ACT IN AERO
2.0000 | INHALATION_SPRAY | Freq: Two times a day (BID) | RESPIRATORY_TRACT | Status: DC
  Administered 2011-10-23 – 2011-10-28 (×9): 2 via RESPIRATORY_TRACT
  Filled 2011-10-23 (×2): qty 6

## 2011-10-23 MED ORDER — OLOPATADINE HCL 0.6 % NA SOLN
2.0000 | Freq: Every day | NASAL | Status: DC
  Administered 2011-10-25 – 2011-10-28 (×4): 2 via NASAL
  Filled 2011-10-23 (×4): qty 30.5

## 2011-10-23 NOTE — Progress Notes (Signed)
Clinical Social Worker met with pt at bedside and provided emotional support.  CSW validated feelings associated with hospitalizations.  CSW noted that pt was upset about a her roommate being "left on the potty chair for too long".  CSW agreed to communicate this with RN.  CSW reviewed support system.  CSW to continue to follow and assist as needed.  Angelia Mould, MSW, [place] [phone]

## 2011-10-23 NOTE — Progress Notes (Signed)
Cc: followup status asthma  Subjective: Feels jittery still, thinks she is about 50% baseline compared with 20% when she was admitted. Still has wet cough that is non-productive and reports post-nasal gtt.     Objective: Vital signs in last 24 hours: Temp:  [97.9 F (36.6 C)-98.5 F (36.9 C)] 98.5 F (36.9 C) (12/12 0605) Pulse Rate:  [76-100] 76  (12/12 0605) Resp:  [18-22] 22  (12/12 0605) BP: (134-181)/(75-86) 134/75 mmHg (12/12 0605) SpO2:  [94 %-99 %] 99 % (12/12 0605) Weight change:    Intake/Output from previous day: 12/11 0701 - 12/12 0700 In: 1792.5 [P.O.:600; I.V.:1192.5] Out: 3 [Urine:3] Intake/Output this shift: Total I/O In: 360 [P.O.:360] Out: 1 [Urine:1]  PHYSICAL EXAM Gen: looks better Psych: cheerful. Not tearful HEENT: Loud audible wheeze on upper airway auscultation. Supple neck. No neck nodes. No elevated JVP RESP: occasional rhonchi, exp wheeze.   CVS: S1S2+. No murmur PA: obese, soft, no mass EXT: No chyanosi, No clubbing. No edema SKIN: intact   Lab Results:  Basename 10/23/11 0546 10/21/11 1551  WBC 14.3* 9.4  HGB 10.6* 12.0  HCT 33.8* 37.3  PLT 234 251   BMET  Basename 10/23/11 0546 10/21/11 1551  NA 141 139  K 4.1 3.8  CL 106 104  CO2 23 23  GLUCOSE 139* 107*  BUN 30* 19  CREATININE 1.35* 1.40*  CALCIUM 9.4 9.6   Lab Results  Component Value Date   CREATININE 1.35* 10/23/2011    Studies/Results: Portable Chest Xray  10/21/2011  *RADIOLOGY REPORT*  Clinical Data: Status asthmaticus.  Shortness of breath.  PORTABLE CHEST - 1 VIEW  Comparison: 05/28/2011  Findings: Heart size and vascularity are normal.  Peribronchial thickening consistent with bronchitis.  No infiltrates or effusions.  No acute osseous abnormality.  IMPRESSION: Mild bronchitic changes.  Original Report Authenticated By: Gwynn Burly, M.D.    Medications: I have reviewed the patient's current medications.  Assessment/Plan: STATUS ASTHMA, slowly  improving. Seemingly component of upper-airway involvement (specifically PND and reflux).  plan -transition to prednisone - follow up on spirometry (awaiting report) -flutter valve -transition to home bd/ics regimen -Continue avelox -escalate reflux rx and add rx for PND.    Acute renal insuff: improving w/ IVFs.  Recent Labs  [facility] 10/23/11 0546 10/21/11 1551   CREATININE 1.35* 1.40*  plan: -f/u chemistry in am. No change in IVFs.   Hypertension Plan:  - monitor severity with steroid wean - no change   LOS: 2 days   BABCOCK,PETE 10/23/2011, 1:55 PM     STAFF NOTE: I, Dr Lavinia Sharps have personally reviewed patient's available data, including medical history, events of note, physical examination and test results as part of my evaluation. I have discussed with NP and other care providers such as  RN and RRT.  In addition,  I personally evaluated patient and elicited key findings of  The fact she is better subjectively and objectively. Fev1 today is 1L/61%, ratio 58 and this nearly double that of pre-admit. Agree with change to home ICS and po prednisone. Will change avelox to po as well. Might get one more spirometry prior to dc, Her renal insuf is slightly improved but she does not wany any more blood draws this admit and so this will be tracked as opd. She thinks she will be ready to go home on Friday 10/25/11. Will dc IV fluids today. She continues to refuse heparin prophylaxis for DVT, Of note, she says she prefers patanase nasal spray to  astepro so wil make that switch Rest per NP whose note is outlined above and that I agree with

## 2011-10-23 NOTE — Progress Notes (Signed)
Respiratory attempted to pick patient up for PFT's. Pt stated that she did not want to go because she had not eaten or washed up yet. She says that she has a ritual and does not want to go downstairs for a procedure until she is ready. She said she doesn't want to be noncompliant but right now she is not ready. Currently, she is refusing the procedure, stated she might get it done later if she is ready. [place], [employer]

## 2011-10-24 ENCOUNTER — Encounter (HOSPITAL_COMMUNITY): Payer: Self-pay

## 2011-10-24 MED ORDER — ESOMEPRAZOLE MAGNESIUM 40 MG PO CPDR
40.0000 mg | DELAYED_RELEASE_CAPSULE | Freq: Every day | ORAL | Status: DC
  Administered 2011-10-25 – 2011-10-27 (×3): 40 mg via ORAL
  Filled 2011-10-24 (×5): qty 1

## 2011-10-24 MED ORDER — NON FORMULARY: 40.0000 mg | Freq: Every day | Status: DC

## 2011-10-24 MED ORDER — WHITE PETROLATUM GEL
Status: AC
  Administered 2011-10-24: 04:00:00
  Filled 2011-10-24: qty 5

## 2011-10-24 MED ORDER — MOXIFLOXACIN HCL 400 MG PO TABS
400.0000 mg | ORAL_TABLET | Freq: Every day | ORAL | Status: AC
  Administered 2011-10-24 – 2011-10-28 (×5): 400 mg via ORAL
  Filled 2011-10-24 (×5): qty 1

## 2011-10-24 NOTE — Progress Notes (Signed)
Cc: Followup asthma status Followup Hypertension Followup  Renal insuff  Subjective: Now on po pred and home symbicort. Feels symbicort is not being sufficient enough. BP improved after coming off solumedrol. Creatinine not checked today due to her refusal for blood draw. She is unsure if she is ready for dc. But admit wheezing improved  Objective: Vital signs in last 24 hours: Temp:  [97.6 F (36.4 C)-97.8 F (36.6 C)] 97.7 F (36.5 C) (12/13 1400) Pulse Rate:  [66-79] 68  (12/13 1400) Resp:  [20-22] 22  (12/13 1400) BP: (123-141)/(61-73) 141/61 mmHg (12/13 1400) SpO2:  [89 %-100 %] 96 % (12/13 1400) Weight change:    Intake/Output from previous day: 12/12 0701 - 12/13 0700 In: 360 [P.O.:360] Out: 1 [Urine:1] Intake/Output this shift: Total I/O In: 600 [P.O.:600] Out: 2 [Urine:1; Stool:1]  Gen: looks better  Psych: cheerful. Not tearful  HEENT: Loud audible wheeze on upper airway auscultation. Supple neck. No neck nodes. No elevated JVP  RESP: occasional rhonchi, exp wheeze.  CVS: S1S2+. No murmur  PA: obese, soft, no mass  EXT: No chyanosi, No clubbing. No edema  SKIN: intact   Lab Results:  Basename 10/23/11 0546  WBC 14.3*  HGB 10.6*  HCT 33.8*  PLT 234   BMET  Basename 10/23/11 0546  NA 141  K 4.1  CL 106  CO2 23  GLUCOSE 139*  BUN 30*  CREATININE 1.35*  CALCIUM 9.4    Studies/Results: No results found.  Medications:  Scheduled:   . amLODipine  5 mg Oral Daily  . antiseptic oral rinse  15 mL Mouth Rinse q12n4p  . budesonide-formoterol  2 puff Inhalation BID  . chlorhexidine  15 mL Mouth Rinse BID  . cycloSPORINE  1 drop Both Eyes BID  . hydrochlorothiazide  25 mg Oral Daily  . montelukast  10 mg Oral Daily  . moxifloxacin  400 mg Oral Daily  . olopatadine  1 drop Both Eyes BID  . Olopatadine HCl  2 puff Nasal Daily  . pantoprazole  80 mg Oral Q1200  . predniSONE  40 mg Oral Q breakfast  . white petrolatum         Assessment/Plan: Patient Active Hospital Problem List: Status asthmaticus (10/21/2011)   Assessment: Improved overall but she subjectively feels symbicort not being sufficient though yesterday fev1 was > 60%.    Plan: Continue po avelox, symbicort and po prednisone. Check spirometry early tomorrow and plan on dc if fev1 > 50%  Acute renal insufficiency (10/22/2011)   Assessment: Improved 12/12. REfused blood draw 12/13   Plan: opd fu with pmd or renal  Hypertension: Improved PLAN Monitor    LOS: 3 days   Julita Ozbun 10/24/2011, 6:31 PM

## 2011-10-25 ENCOUNTER — Inpatient Hospital Stay (HOSPITAL_COMMUNITY): Payer: Medicare Other

## 2011-10-25 DIAGNOSIS — I10 Essential (primary) hypertension: Secondary | ICD-10-CM

## 2011-10-25 HISTORY — DX: Essential (primary) hypertension: I10

## 2011-10-25 MED ORDER — IPRATROPIUM BROMIDE 0.02 % IN SOLN
0.5000 mg | Freq: Four times a day (QID) | RESPIRATORY_TRACT | Status: DC
  Administered 2011-10-25 – 2011-10-28 (×13): 0.5 mg via RESPIRATORY_TRACT
  Filled 2011-10-25 (×13): qty 2.5

## 2011-10-25 MED ORDER — LEVALBUTEROL HCL 0.63 MG/3ML IN NEBU
0.6300 mg | INHALATION_SOLUTION | Freq: Four times a day (QID) | RESPIRATORY_TRACT | Status: DC
  Administered 2011-10-25 – 2011-10-28 (×13): 0.63 mg via RESPIRATORY_TRACT
  Filled 2011-10-25 (×17): qty 3

## 2011-10-25 NOTE — Progress Notes (Signed)
Cc:  Followup asthma status  Followup Hypertension  Followup Renal insuff   Subjective: Feels that home regimen of symbicport not enough and that wheezing is worse. Want increased nebulizer. Spirometry pending oas of this morning. Feels not ready to go home. Feels mucus stuck in chest and uanble to cough. BP still a bit high.   Objective: Vital signs in last 24 hours: Temp:  [97.3 F (36.3 C)-98.4 F (36.9 C)] 98.4 F (36.9 C) (12/14 1332) Pulse Rate:  [61-78] 78  (12/14 1332) Resp:  [20-21] 20  (12/14 1332) BP: (117-167)/(70-83) 150/83 mmHg (12/14 1332) SpO2:  [93 %-99 %] 97 % (12/14 1332) Weight change:    Intake/Output from previous day: 12/13 0701 - 12/14 0700 In: 840 [P.O.:840] Out: 3 [Urine:2; Stool:1] Intake/Output this shift:   Gen: looks better  Psych: cheerful. Not tearful  HEENT: Loud audible wheeze on upper airway auscultation. Supple neck. No neck nodes. No elevated JVP  RESP: occasional rhonchi, exp wheeze.  CVS: S1S2+. No murmur  PA: obese, soft, no mass  EXT: No chyanosi, No clubbing. No edema  SKIN: intact    Lab Results:  Basename 10/23/11 0546  WBC 14.3*  HGB 10.6*  HCT 33.8*  PLT 234   BMET  Basename 10/23/11 0546  NA 141  K 4.1  CL 106  CO2 23  GLUCOSE 139*  BUN 30*  CREATININE 1.35*  CALCIUM 9.4    Studies/Results: No results found.  Medications:  I have reviewed the patient's current medications. Scheduled:   . amLODipine  5 mg Oral Daily  . antiseptic oral rinse  15 mL Mouth Rinse q12n4p  . budesonide-formoterol  2 puff Inhalation BID  . chlorhexidine  15 mL Mouth Rinse BID  . cycloSPORINE  1 drop Both Eyes BID  . esomeprazole  40 mg Oral QHS  . hydrochlorothiazide  25 mg Oral Daily  . ipratropium  0.5 mg Nebulization Q6H  . levalbuterol  0.63 mg Nebulization Q6H  . montelukast  10 mg Oral Daily  . moxifloxacin  400 mg Oral Daily  . olopatadine  1 drop Both Eyes BID  . Olopatadine HCl  2 puff Nasal Daily  .  predniSONE  40 mg Oral Q breakfast  . DISCONTD: NON FORMULARY 40 mg  40 mg Oral QHS  . DISCONTD: pantoprazole  80 mg Oral Q1200    Assessment/Plan: Patient Active Hospital Problem List: Status asthmaticus (10/21/2011)   Assessment: Plateau in improvement   Plan: Increase nebulizer frequency, await spirometryand postpone discharge  Acute renal insufficiency (10/22/2011)   Assessment: Suspect due to hypertension. Unclear current status due to refusal of blood drawn    Plan: The patient follow up with her primary care doctor Hypertension Assessment: Still somewhat high Plan: Increase amlodipine to 10 mg per day    LOS: 4 days   [name] [name] 10/25/2011, 6:23 PM

## 2011-10-25 NOTE — Progress Notes (Signed)
   CARE MANAGEMENT NOTE 10/25/2011  Patient:  [name] [name], [name] [name]   Account Number:  [id number]  Date Initiated:  10/23/2011  Documentation initiated by:  [facility]  Subjective/Objective Assessment:   Asthma flare     Action/Plan:   PTA, PT INDEPENDENT, LIVES WITH DAUGHTER.   Anticipated DC Date:  10/25/2011   Anticipated DC Plan:  HOME W HOME HEALTH SERVICES      DC Planning Services  CM consult      [facility] Choice  HOME HEALTH   Choice offered to / List presented to:  C-1 Patient   DME arranged  [facility]      DME agency  [facility]     HH arranged  HH-2 PT  HH-1 RN      [place] agency  [facility]   Status of service:  Completed, signed off Medicare Important Message given?   (If response is "NO", the following Medicare IM given date fields will be blank) Date Medicare IM given:   Date Additional Medicare IM given:    Discharge Disposition:  HOME W HOME HEALTH SERVICES  Per UR Regulation:  Reviewed for med. necessity/level of care/duration of stay  Comments:  10/25/11 [name] Wellbrock,RN,BSN 1200 MET WITH PT AND DAUGHTER TO DISCUSS DC PLANS.  PT COULD BENEFIT FROM HH BENEFIT AT DISCHARGE.  PT TO DC HOME WITH DAUGHTER; STATES HAS USED [facility] IN THE PAST AND WOULD LIKE TO USE AGAIN.  WILL OBTAIN ORDERS AND ARRANGE SERVICES.  START OF CARE 24-48H POST DC DATE.   PT  ALSO REQUESTS [facility] WITH BACK FOR HOME USE.  REFERRAL TO JUSTIN WITH [facility]. Phone #[phone]   10-23-11 8am Avie Arenas, RNBSN - [phone] UR Completed.

## 2011-10-25 NOTE — Progress Notes (Signed)
Clinical Social Worker met with pt at bedside and provided emotional support.  CSW provided opportunity for pt to process feelings and emotions.  Pt volunteered that she is interested in speaking with someone at Hospice about grief.  CSW provided referral information to pt.  CSW to continue to follow and assist as needed.  Angelia Mould, MSW, [place] [phone]

## 2011-10-26 ENCOUNTER — Other Ambulatory Visit: Payer: Self-pay | Admitting: Internal Medicine

## 2011-10-26 MED ORDER — WHITE PETROLATUM GEL
Status: AC
  Administered 2011-10-26: 08:00:00
  Filled 2011-10-26: qty 5

## 2011-10-26 NOTE — Progress Notes (Signed)
Cc:  Followup asthma status  Followup Hypertension  Followup Renal insuff   Subjective: Had episode last PM states due to pharm not getting her Symbicort yest.  Still wheezing & congested. Can't tol Mucinex she says.  Still doesn't want f/u lab.   Objective: Vital signs in last 24 hours: Temp:  [98.2 F (36.8 C)-98.4 F (36.9 C)] 98.3 F (36.8 C) (12/15 0638) Pulse Rate:  [66-78] 70  (12/15 0638) Resp:  [19-20] 19  (12/15 0638) BP: (129-167)/(71-83) 129/71 mmHg (12/15 0638) SpO2:  [92 %-98 %] 94 % (12/15 0826) FiO2 (%):  [21 %] 21 % (12/15 0826) Weight change:    Intake/Output from previous day: 12/14 0701 - 12/15 0700 In: 240 [P.O.:240] Out: -  Intake/Output this shift:   PE: Gen: looks better  Psych: cheerful. Not tearful  HEENT: Loud audible wheeze on upper airway auscultation. Supple neck. No neck nodes. No elevated JVP  RESP: exp wheezing & rhonchi bilat CVS: S1S2+. No murmur  PA: obese, soft, no mass  EXT: No chyanosi, No clubbing. No edema  SKIN: intact   Lab Results: No results found for this basename: WBC:2,HGB:2,HCT:2,PLT:2 in the last 72 hours BMET  Lab 10/23/11 0546 10/21/11 1551  NA 141 139  K 4.1 3.8  CL 106 104  CO2 23 23  GLUCOSE 139* 107*  BUN 30* 19  CREATININE 1.35* 1.40*  CALCIUM 9.4 9.6  MG 2.0 1.9  PHOS 3.3 2.7    Studies/Results: CXR 12/10> Findings: Heart size and vascularity are normal. Peribronchial thickening consistent with bronchitis. No infiltrates or  effusions. No acute osseous abnormality   Medications:  I have reviewed the patient's current medications. Scheduled:    . amLODipine  5 mg Oral Daily  . antiseptic oral rinse  15 mL Mouth Rinse q12n4p  . budesonide-formoterol  2 puff Inhalation BID  . chlorhexidine  15 mL Mouth Rinse BID  . cycloSPORINE  1 drop Both Eyes BID  . esomeprazole  40 mg Oral QHS  . hydrochlorothiazide  25 mg Oral Daily  . ipratropium  0.5 mg Nebulization Q6H  . levalbuterol  0.63 mg  Nebulization Q6H  . montelukast  10 mg Oral Daily  . moxifloxacin  400 mg Oral Daily  . olopatadine  1 drop Both Eyes BID  . Olopatadine HCl  2 puff Nasal Daily  . predniSONE  40 mg Oral Q breakfast  . white petrolatum        Assessment/Plan: Patient Active Hospital Problem List:  1. Status asthmaticus (10/21/2011)   Assessment: Plateau in improvement   Plan: Increase nebulizer frequency, await spirometryand postpone discharge... -continue Pred40Qam, Symbicort, Xopenex & Atrovent NEB Q6H, Singulair, Avelox, etc... -she states bad reaction to Mucinex in the past, therefore not used.  2. Renal insufficiency (10/22/2011)   Assessment: Suspect due to hypertension. Unclear current status due to refusal of blood drawn.  Creat= 1.3-1.4 over the last 9 mo.   Plan: The patient follow up with her primary care doctor  3. Hypertension - her primary doctor is DrNorins Assessment:  BP improved> Plan: Continue Amlodipine, HCT -she declines f/u lab draw    LOS: 5 days   Ravneet Spilker M 10/26/2011, 8:57 AM

## 2011-10-27 DIAGNOSIS — N179 Acute kidney failure, unspecified: Secondary | ICD-10-CM

## 2011-10-27 DIAGNOSIS — J45902 Unspecified asthma with status asthmaticus: Secondary | ICD-10-CM

## 2011-10-27 MED ORDER — ESOMEPRAZOLE MAGNESIUM 40 MG PO CPDR
40.0000 mg | DELAYED_RELEASE_CAPSULE | Freq: Every day | ORAL | Status: DC
  Administered 2011-10-28: 40 mg via ORAL
  Filled 2011-10-27: qty 1

## 2011-10-27 NOTE — Progress Notes (Signed)
Cc:  Followup asthma status  Followup Hypertension  Followup Renal insuff   Subjective: 12/15> Had episode last PM states due to pharm not getting her Symbicort yest.  Still wheezing & congested. Can't tol Mucinex she says.  Still doesn't want f/u lab. 12/16>    Objective: Vital signs in last 24 hours: Temp:  [97.4 F (36.3 C)-98.7 F (37.1 C)] 97.4 F (36.3 C) (12/16 0500) Pulse Rate:  [62-90] 62  (12/16 0500) Resp:  [20-22] 20  (12/16 0500) BP: (117-149)/(69-80) 117/69 mmHg (12/16 0500) SpO2:  [94 %-97 %] 95 % (12/16 0500) FiO2 (%):  [0.2 %] 0.2 % (12/16 0831) Weight change:    Intake/Output from previous day: 12/15 0701 - 12/16 0700 In: 840 [P.O.:840] Out: 9 [Urine:6; Stool:3] Intake/Output this shift:   PE: Gen: looks better  Psych: cheerful. Not tearful  HEENT: Loud audible wheeze on upper airway auscultation. Supple neck. No neck nodes. No elevated JVP  RESP: exp wheezing & rhonchi bilat CVS: S1S2+. No murmur  PA: obese, soft, no mass  EXT: No chyanosi, No clubbing. No edema  SKIN: intact   Lab Results: No results found for this basename: WBC:2,HGB:2,HCT:2,PLT:2 in the last 72 hours BMET  Lab 10/23/11 0546 10/21/11 1551  NA 141 139  K 4.1 3.8  CL 106 104  CO2 23 23  GLUCOSE 139* 107*  BUN 30* 19  CREATININE 1.35* 1.40*  CALCIUM 9.4 9.6  MG 2.0 1.9  PHOS 3.3 2.7    Studies/Results: CXR 12/10> Findings: Heart size and vascularity are normal. Peribronchial thickening consistent with bronchitis. No infiltrates or  effusions. No acute osseous abnormality   Medications:  I have reviewed the patient's current medications. Scheduled:    . amLODipine  5 mg Oral Daily  . antiseptic oral rinse  15 mL Mouth Rinse q12n4p  . budesonide-formoterol  2 puff Inhalation BID  . chlorhexidine  15 mL Mouth Rinse BID  . cycloSPORINE  1 drop Both Eyes BID  . esomeprazole  40 mg Oral QHS  . hydrochlorothiazide  25 mg Oral Daily  . ipratropium  0.5 mg  Nebulization Q6H  . levalbuterol  0.63 mg Nebulization Q6H  . montelukast  10 mg Oral Daily  . moxifloxacin  400 mg Oral Daily  . olopatadine  1 drop Both Eyes BID  . Olopatadine HCl  2 puff Nasal Daily  . predniSONE  40 mg Oral Q breakfast    Assessment/Plan: Patient Active Hospital Problem List:  1. Status asthmaticus (10/21/2011)   Assessment: Plateau in improvement   Plan: Increase nebulizer frequency, await spirometryand postpone discharge... -continue Pred40Qam, Symbicort, Xopenex & Atrovent NEB Q6H, Singulair, Avelox, etc... -she states bad reaction to Mucinex in the past, therefore not used.  2. Renal insufficiency (10/22/2011)   Assessment: Suspect due to hypertension. Unclear current status due to refusal of blood drawn.  Creat= 1.3-1.4 over the last 9 mo.   Plan: The patient follow up with her primary care doctor  3. Hypertension - her primary doctor is DrNorins Assessment:  BP improved> Plan: Continue Amlodipine, HCT -she declines f/u lab draw  4.  Weakness, ?disposition: She is reluctant to go home> on all oral meds & weaning; offered vitamin, PT/OT, would prob benefit from pulm rehab...    LOS: 6 days   NADEL,SCOTT M 10/27/2011, 11:07 AM

## 2011-10-28 DIAGNOSIS — I10 Essential (primary) hypertension: Secondary | ICD-10-CM

## 2011-10-28 MED ORDER — MOXIFLOXACIN HCL 400 MG PO TABS: 400.0000 mg | ORAL_TABLET | Freq: Every day | ORAL | Status: DC

## 2011-10-28 MED ORDER — LEVALBUTEROL HCL 0.63 MG/3ML IN NEBU: 1.0000 | INHALATION_SOLUTION | Freq: Four times a day (QID) | RESPIRATORY_TRACT | Status: DC

## 2011-10-28 MED ORDER — IPRATROPIUM BROMIDE 0.02 % IN SOLN: 0.5000 mg | Freq: Four times a day (QID) | RESPIRATORY_TRACT | Status: DC

## 2011-10-28 MED ORDER — PREDNISONE 10 MG PO TABS: ORAL_TABLET | ORAL | Status: DC

## 2011-10-28 NOTE — Discharge Summary (Signed)
Physician Discharge Summary  Patient ID: [name] [name] MRN: [id number] DOB/AGE: June 10, 1941 70 y.o.  Admit date: 10/21/2011 Discharge date: 10/28/2011    Discharge Diagnoses:  Principal Problem:  *Status asthmaticus Active Problems:  Acute renal insufficiency  Hypertension  Weakness generalized    Brief Summary: [name] [name] is a 70 y.o. y/o female with a PMH of asthma, OSA, bronchiectasis, anxiety who presented 12/10 to pulmonary office with c/o "asthma attack". Past week increased cough, shortness of breath, wheeze, nocturanl awakening. Rates symptoms as severe. Feels miserable. Denied fever, sputum, edema, chest pain. Fev1 0.7L/37%, Ratio 67% and worst ever therefor pt admitted to [facility] for further treatment asthma exacerbation.    Hospital Course by Discharge Summary  Status asthmaticus -- patient did receive nebulizers and Depo-Medrol injection in the office prior to admission. She was initially treated with IV antibiotics IV steroids and nebulized bronchodilators. She was then transitioned to by mouth prednisone and by mouth antibiotics. She's currently tapering prednisone back down to her maintenance dose over 2 weeks. Her respiratory status is back to her baseline. She'll be discharged on by mouth prednisone taper, Symbicort, Xopenex and Atrovent nebulizers, Singulair and Avelox. She has had a bad reaction to Mucinex in the past and therefore we'll not discharged on Mucinex. She will followup with Rubye Oaks nurse practitioner in the pulmonary office in approximately one week. She will then follow up with Dr. Marchelle Gearing with pulmonary function tests in January. Her anxiety likely also contributes to her dyspnea and patient was continued on her home Xanax during this admission when she will also be discharged on. Will reattempt Xolair at reduced frequency and dose once precertification is done. Dr. Marchelle Gearing has filled out her Xolair form.   Acute renal insufficiency -- baseline  creatinine is unknown. However creatinine has been 1.3-1.4 over the last 9 months. Patient has refused blood draws this admission. Admission creatinine was 1.35. She be followed up with patient's primary care physician Dr. Debby Bud and she has an appointment with him in the next several days.   Hypertension-- blood pressure has been controlled this admission she'll be discharged on her home dose of amlodipine and HCTZ. Again this will be followed up by her primary care physician.  Weakness -- patient is very weak and deconditioned secondary to multiple recent asthma exacerbations and chronic dyspnea. She has been unclear as to whether she will participate in home PT. We will go and order this for discharge and have stressed the importance of this as well as continued pulmonary rehabilitation at discharge.   Microbiology/Sepsis markers: none  Significant Diagnostic Studies:  none  Discharge Exam: -- See Dr. Marchelle Gearing 12/17 progress note   Most Recent Labs  BMET    Component Value Date/Time   NA 141 10/23/2011 0546   K 4.1 10/23/2011 0546   CL 106 10/23/2011 0546   CO2 23 10/23/2011 0546   GLUCOSE 139* 10/23/2011 0546   BUN 30* 10/23/2011 0546   CREATININE 1.35* 10/23/2011 0546   CALCIUM 9.4 10/23/2011 0546   GFRNONAA 39* 10/23/2011 0546   GFRAA 46* 10/23/2011 0546   Lab Results  Component Value Date   WBC 14.3* 10/23/2011   HGB 10.6* 10/23/2011   HCT 33.8* 10/23/2011   MCV 87.1 10/23/2011   PLT 234 10/23/2011      [name] [name]  Home Medication Instructions FAO:[id number]   Printed on:10/28/11 0925  Medication Information  cycloSPORINE (RESTASIS) 0.05 % ophthalmic emulsion Place 1 drop into both eyes 2 (two) times daily. BMN           desloratadine (CLARINEX) 5 MG tablet Take 5 mg by mouth every evening. BMN           levalbuterol (XOPENEX HFA) 45 MCG/ACT inhaler Inhale 2 puffs into the lungs every 4 (four) hours as needed. Shortness of breath            EPINEPHrine (EPIPEN) 0.3 mg/0.3 mL DEVI Inject 0.3 mg into the muscle once as needed. Use as directed for severe allergic reaction           SINGULAIR 10 MG tablet Take 1 tablet (10 mg total) by mouth daily. BRAND NAME ONLY           ALPRAZolam (XANAX) 0.5 MG tablet Take 0.5 mg by mouth every 6 (six) hours as needed. For anxiety            amLODipine (NORVASC) 5 MG tablet Take 5 mg by mouth daily.             HYDROcodone-acetaminophen (VICODIN ES) 7.5-750 MG per tablet Take 1 tablet by mouth every 6 (six) hours as needed. For pain            hydrochlorothiazide (HYDRODIURIL) 25 MG tablet Take 25 mg by mouth daily.             esomeprazole (NEXIUM) 40 MG capsule Take 40 mg by mouth daily before breakfast.             olopatadine (PATANOL) 0.1 % ophthalmic solution Place 2 drops into both eyes 2 (two) times daily.             budesonide-formoterol (SYMBICORT) 160-4.5 MCG/ACT inhaler Inhale 2 puffs into the lungs 2 (two) times daily. Rinse mouth            predniSONE (DELTASONE) 5 MG tablet Take 2.5-10 mg by mouth daily. 0.5-2 tabs daily for long term control of asthma or as directed. Takes larger dose for days when having greater asthma  symptoms            ipratropium (ATROVENT) 0.02 % nebulizer solution Take 2.5 mLs (0.5 mg total) by nebulization every 6 (six) hours.           moxifloxacin (AVELOX) 400 MG tablet Take 1 tablet (400 mg total) by mouth daily.           predniSONE (DELTASONE) 10 MG tablet 4 tabs PO daily x 3 days then 3 tabs PO daily x 3 days then 2 tabs PO daily x 3 days then 1 tab PO daily x 3 days then back to 5mg  PO daily maintenance dose.           levalbuterol (XOPENEX) 0.63 MG/3ML nebulizer solution Take 3 mLs (0.63 mg total) by nebulization every 6 (six) hours. Shortness of breath               Follow-up Information    Follow up with Illene Regulus, MD on 10/30/2011. (1:00 pm)    Contact information:   [address] [facility] [address] [place] [address]  [address] [place] [address] [phone]       Follow up with [facility], NP on 11/04/2011. (1030am)    Contact information:   [facility] [address] [place] [address] [phone]       Follow up with [facility], MD on 12/10/2011. (12:00 PFT  1:45 MD appt)    Contact information:   [address] [place] [address] [phone]         Follow-up Information    Follow up with Illene Regulus, MD on 10/30/2011. (1:00 pm)    Contact information:   [address] [facility] [address] [place] [address] [place] [address] [phone]       Follow up with [facility], NP on 11/04/2011. (1030am)    Contact information:   [facility] [address] [place] [address] [phone]       Follow up with [facility], MD on 12/10/2011. (12:00 PFT     1:45 MD appt)    Contact information:   [address] [place] [address] [phone]          Disposition: Home with Home-Health Care Svc Discharged Condition: [name] [name] has met maximum benefit of inpatient care and is medically stable and cleared for discharge.  Patient is pending follow up as above.      Time spent on disposition:  Greater than 35 minutes.   SignedDanford Bad, NP 10/28/2011  9:34 AM  *Care during the described time interval was provided by me and/or other providers on the critical care team. I have reviewed this patient's available data, including medical history, events of note, physical examination and test results as part of my evaluation.

## 2011-10-28 NOTE — Progress Notes (Signed)
IV removed, site unremarkable. Discharge instructions, prescriptions, and follow-up appointments given. Pt instructed when to call MD. All belongings sent with pt and all questions answered.  Pt transported in wheelchair by tech and traveled home with family member by private vehicle.

## 2011-10-28 NOTE — Progress Notes (Signed)
Cc:  Followup asthma status  Followup Hypertension  Followup Renal insuff   Subjective: Feels better. Feels ready to go home. Feels like  'whole new person'. In fact, fev1 10/25/11 Friday 1.18L/73% predicted and best ever I have seen. Still refusing labs. BP control improved.    Objective: Vital signs in last 24 hours: Temp:  [97.9 F (36.6 C)-98.6 F (37 C)] 98.6 F (37 C) (12/17 0517) Pulse Rate:  [65-87] 65  (12/17 0517) Resp:  [18] 18  (12/17 0517) BP: (111-144)/(64-84) 144/84 mmHg (12/17 0517) SpO2:  [93 %-99 %] 98 % (12/17 0843) Weight change:    Intake/Output from previous day:   Intake/Output this shift:   PE: Gen: looks better . Looks best ever this hospital Psych: cheerful. Not tearful  HEENT: No more loud wheeze.  Supple neck. No neck nodes. No elevated JVP  RESP: all wheezing resolved. Clear breath sounds. No acc muscle use CVS: S1S2+. No murmur  PA: obese, soft, no mass  EXT: No chyanosi, No clubbing. No edema  SKIN: intact   Lab Results: No results found for this basename: WBC:2,HGB:2,HCT:2,PLT:2 in the last 72 hours BMET  Lab 10/23/11 0546 10/21/11 1551  NA 141 139  K 4.1 3.8  CL 106 104  CO2 23 23  GLUCOSE 139* 107*  BUN 30* 19  CREATININE 1.35* 1.40*  CALCIUM 9.4 9.6  MG 2.0 1.9  PHOS 3.3 2.7    Studies/Results: CXR 12/10> Findings: Heart size and vascularity are normal. Peribronchial thickening consistent with bronchitis. No infiltrates or  effusions. No acute osseous abnormality   Medications:  I have reviewed the patient's current medications. Scheduled:    . amLODipine  5 mg Oral Daily  . antiseptic oral rinse  15 mL Mouth Rinse q12n4p  . budesonide-formoterol  2 puff Inhalation BID  . chlorhexidine  15 mL Mouth Rinse BID  . cycloSPORINE  1 drop Both Eyes BID  . esomeprazole  40 mg Oral Daily  . hydrochlorothiazide  25 mg Oral Daily  . ipratropium  0.5 mg Nebulization Q6H  . levalbuterol  0.63 mg Nebulization Q6H  .  montelukast  10 mg Oral Daily  . moxifloxacin  400 mg Oral Daily  . olopatadine  1 drop Both Eyes BID  . Olopatadine HCl  2 puff Nasal Daily  . predniSONE  40 mg Oral Q breakfast  . DISCONTD: esomeprazole  40 mg Oral QHS    Assessment/Plan: Patient Active Hospital Problem List:  1. Status asthmaticus (10/21/2011)   Assessment: Much improved. Fev1 > 70% best ever on 10/25/11. Clinically currently she is looking even better. The kind of improvement she had here and fev1 numbers better than clinic suggests to me that she might be non-compliant with meds at home.     Plan:  -continue Pred ----- do 2 week taper from start day of prednisone which was few days ago. Taper should be Please take Take prednisone 40mg  once daily x 3 days, then 30mg  once daily x 3 days, then 20mg  once daily x 3 days, then prednisone 10mg  once daily  x 3 days and then prednisone 5mg  daily and then baseline home dose of prednisone  - Symbicort, Xopenex & Atrovent NEB Q6H, Singulair, Avelox, etc...  -she states bad reaction to Mucinex in the past, therefore not used.  - have filled out xolair form; will try xolair (reattempt) and reduced frequency and dose - use it once a month per her request  2. Renal insufficiency (10/22/2011)   Assessment:  Suspect due to hypertension. Unclear current status due to refusal of blood drawn.  Creat= 1.3-1.4 over the last 9 mo.   Plan: The patient follow up with her primary care doctor  3. Hypertension - her primary doctor is Dr Debby Bud Assessment:  BP improved> Plan: Continue Amlodipine (dose increased this hospital stay), HCT -she declines f/u lab draw - I counseled her about importance ofkeeping BP under control and fu creat; she agrees  4.  Weakness, ?disposition: She is reluctant to go home> on all oral meds & weaning; offered vitamin, PT/OT, would prob benefit from pulm rehab... She is asking for home PT. Need to clarify if she really wants it. FU with NP [name] or Dr MR in < 10  days with fev1 spirometry in office at [facility]   > 35 min dc planning      LOS: 7 days   Aulden Calise 10/28/2011, 8:50 AM

## 2011-10-29 ENCOUNTER — Telehealth: Payer: Self-pay | Admitting: *Deleted

## 2011-10-29 ENCOUNTER — Encounter (HOSPITAL_COMMUNITY)
Admission: RE | Admit: 2011-10-29 | Discharge: 2011-10-29 | Disposition: A | Discharge status: Home / Self Care | Payer: Self-pay | Source: Ambulatory Visit | Attending: Internal Medicine | Admitting: Internal Medicine

## 2011-10-29 NOTE — Telephone Encounter (Signed)
Pt called stating she needs a refill on her medications. Pt did not specify what medications she needed refilled. I called pt with no answer and left a message on her AM so she can clarify which meds she needs. I am waiting on pt to call back

## 2011-10-29 NOTE — Discharge Summary (Signed)
Agree with dc plan. Patient is to see me. I personally saw patient (see sep note) prior to dc

## 2011-10-30 ENCOUNTER — Encounter: Payer: Self-pay | Admitting: Internal Medicine

## 2011-10-30 ENCOUNTER — Telehealth: Payer: Self-pay | Admitting: Gastroenterology

## 2011-10-30 ENCOUNTER — Ambulatory Visit (INDEPENDENT_AMBULATORY_CARE_PROVIDER_SITE_OTHER): Payer: Medicare Other | Admitting: Internal Medicine

## 2011-10-30 VITALS — BP 158/90 | HR 90 | Temp 98.1°F | Wt 186.0 lb

## 2011-10-30 DIAGNOSIS — J45909 Unspecified asthma, uncomplicated: Secondary | ICD-10-CM

## 2011-10-30 DIAGNOSIS — N182 Chronic kidney disease, stage 2 (mild): Secondary | ICD-10-CM

## 2011-10-30 DIAGNOSIS — N2889 Other specified disorders of kidney and ureter: Secondary | ICD-10-CM

## 2011-10-30 DIAGNOSIS — J4550 Severe persistent asthma, uncomplicated: Secondary | ICD-10-CM

## 2011-10-30 HISTORY — DX: Other specified disorders of kidney and ureter: N28.89

## 2011-10-30 MED ORDER — BUDESONIDE-FORMOTEROL FUMARATE 160-4.5 MCG/ACT IN AERO: 2.0000 | INHALATION_SPRAY | Freq: Two times a day (BID) | RESPIRATORY_TRACT | Status: DC

## 2011-10-30 MED ORDER — HYDROCODONE-ACETAMINOPHEN 7.5-750 MG PO TABS: 1.0000 | ORAL_TABLET | Freq: Four times a day (QID) | ORAL | Status: DC | PRN

## 2011-10-30 MED ORDER — EPINEPHRINE 0.3 MG/0.3ML IJ DEVI: 0.3000 mg | Freq: Once | INTRAMUSCULAR | Status: DC | PRN

## 2011-10-30 MED ORDER — DESLORATADINE 5 MG PO TABS: 5.0000 mg | ORAL_TABLET | Freq: Every evening | ORAL | Status: DC

## 2011-10-30 MED ORDER — LEVALBUTEROL TARTRATE 45 MCG/ACT IN AERO: 2.0000 | INHALATION_SPRAY | RESPIRATORY_TRACT | Status: DC | PRN

## 2011-10-30 MED ORDER — AMLODIPINE BESYLATE 5 MG PO TABS: 5.0000 mg | ORAL_TABLET | Freq: Every day | ORAL | Status: DC

## 2011-10-30 MED ORDER — ESOMEPRAZOLE MAGNESIUM 40 MG PO CPDR: 40.0000 mg | DELAYED_RELEASE_CAPSULE | Freq: Every day | ORAL | Status: DC

## 2011-10-30 MED ORDER — HYDROCHLOROTHIAZIDE 25 MG PO TABS: 25.0000 mg | ORAL_TABLET | Freq: Every day | ORAL | Status: DC

## 2011-10-30 MED ORDER — SINGULAIR 10 MG PO TABS: 10.0000 mg | ORAL_TABLET | Freq: Every day | ORAL | Status: DC

## 2011-10-30 MED ORDER — ALPRAZOLAM 0.5 MG PO TABS: 0.5000 mg | ORAL_TABLET | Freq: Four times a day (QID) | ORAL | Status: DC | PRN

## 2011-10-30 NOTE — Telephone Encounter (Signed)
Pt came in for appointment today with Dr Debby Bud all of her prescriptions were taken care of and pt informed us that she is using [facility] on [address] for all of her prescriptions as of today

## 2011-10-30 NOTE — Progress Notes (Signed)
Subjective:    Patient ID: [name] [name], female    DOB: Feb 13, 1941, 70 y.o.   MRN: [id number]  HPI Mrs. Guin presents for follow-up: she had status asthmaticus and was admitted Dec -10th for six days. Hospital records reviewed: she did respond to IV steroids and meds. She was discharged in good condition and she is to see [name] Parrett Dec 24th. She is still on steroids and a little "Hyper" for which she has taken xanax.  She is concerned about renal insufficiency. I reviewed labs back to may '10 - creatinine has varied from 1.4 to 1.1. And now is 1.36. Very stable without significant progression. Most likely related to hypertensive.   Her knees continue to hurt but she does not want TKR  I have reviewed the patient's medical history in detail and updated the computerized patient record.  Current Outpatient Prescriptions on File Prior to Visit  Medication Sig Dispense Refill  . ALPRAZolam (XANAX) 0.5 MG tablet Take 0.5 mg by mouth every 6 (six) hours as needed. For anxiety       . amLODipine (NORVASC) 5 MG tablet Take 5 mg by mouth daily.        . budesonide-formoterol (SYMBICORT) 160-4.5 MCG/ACT inhaler Inhale 2 puffs into the lungs 2 (two) times daily. Rinse mouth       . cycloSPORINE (RESTASIS) 0.05 % ophthalmic emulsion Place 1 drop into both eyes 2 (two) times daily. BMN      . desloratadine (CLARINEX) 5 MG tablet Take 5 mg by mouth every evening. BMN      . EPINEPHrine (EPIPEN) 0.3 mg/0.3 mL DEVI Inject 0.3 mg into the muscle once as needed. Use as directed for severe allergic reaction      . esomeprazole (NEXIUM) 40 MG capsule Take 40 mg by mouth daily before breakfast.        . hydrochlorothiazide (HYDRODIURIL) 25 MG tablet Take 25 mg by mouth daily.        Marland Kitchen ipratropium (ATROVENT) 0.02 % nebulizer solution Take 2.5 mLs (0.5 mg total) by nebulization every 6 (six) hours.  300 mL  0  . levalbuterol (XOPENEX HFA) 45 MCG/ACT inhaler Inhale 2 puffs into the lungs every 4 (four) hours as  needed. Shortness of breath      . levalbuterol (XOPENEX) 0.63 MG/3ML nebulizer solution Take 3 mLs (0.63 mg total) by nebulization every 6 (six) hours. Shortness of breath  360 mL  0  . moxifloxacin (AVELOX) 400 MG tablet Take 1 tablet (400 mg total) by mouth daily.  3 tablet  0  . olopatadine (PATANOL) 0.1 % ophthalmic solution Place 2 drops into both eyes 2 (two) times daily.        . predniSONE (DELTASONE) 10 MG tablet 4 tabs PO daily x 3 days then 3 tabs PO daily x 3 days then 2 tabs PO daily x 3 days then 1 tab PO daily x 3 days then back to 5mg  PO daily maintenance dose.  30 tablet  0  . predniSONE (DELTASONE) 5 MG tablet Take 2.5-10 mg by mouth daily. 0.5-2 tabs daily for long term control of asthma or as directed. Takes larger dose for days when having greater asthma  symptoms       . SINGULAIR 10 MG tablet Take 1 tablet (10 mg total) by mouth daily. BRAND NAME ONLY  90 tablet  3      Review of Systems System review is negative for any constitutional, cardiac, pulmonary, GI or neuro symptoms  or complaints other than as described in the HPI.     Objective:   Physical Exam Filed Vitals:   10/30/11 1320  BP: 158/90  Pulse: 90  Temp: 98.1 F (36.7 C)   Gen'l- overweight AA woman in no distress HEENT - C&S clear PUlm - no increased WOB, no wheezing, no accessory muscle use Cor - RRR      Assessment & Plan:

## 2011-10-30 NOTE — Assessment & Plan Note (Signed)
Patient with chronic asthma on multiple medications. She has recovered from her recent exacerbation but is still on high dose prednisone. Lungs are clear today.  Plan - continue chronic meds           Finish prednisone taper           Cautioned to call/be seen if she needs to use rescue inhaler more than 3 times a day.

## 2011-10-30 NOTE — Progress Notes (Signed)
Addended byRosalio Macadamia, Darlyn Repsher B on: 10/30/2011 03:25 PM   Modules accepted: Orders, Medications

## 2011-10-30 NOTE — Telephone Encounter (Signed)
OK for no charge given hospitalization

## 2011-10-30 NOTE — Assessment & Plan Note (Signed)
Patient very worried about kidney function. Creatinine has been stable.  Plan - 24 hr urine for creatinine clearance.

## 2011-10-30 NOTE — Telephone Encounter (Signed)
Dr Russella Dar pleas see notes entered in by [place] and April about cancellation charges

## 2011-10-31 ENCOUNTER — Encounter (HOSPITAL_COMMUNITY)
Admission: RE | Admit: 2011-10-31 | Discharge: 2011-10-31 | Disposition: A | Discharge status: Home / Self Care | Payer: Self-pay | Source: Ambulatory Visit | Attending: Internal Medicine | Admitting: Internal Medicine

## 2011-11-04 ENCOUNTER — Ambulatory Visit (INDEPENDENT_AMBULATORY_CARE_PROVIDER_SITE_OTHER): Payer: Medicare Other | Admitting: Adult Health

## 2011-11-04 ENCOUNTER — Other Ambulatory Visit: Payer: Medicare Other

## 2011-11-04 ENCOUNTER — Encounter: Payer: Self-pay | Admitting: Adult Health

## 2011-11-04 ENCOUNTER — Other Ambulatory Visit: Payer: Self-pay | Admitting: Internal Medicine

## 2011-11-04 VITALS — BP 136/74 | HR 76 | Temp 97.0°F | Ht 64.0 in | Wt 186.6 lb

## 2011-11-04 DIAGNOSIS — N182 Chronic kidney disease, stage 2 (mild): Secondary | ICD-10-CM

## 2011-11-04 DIAGNOSIS — J45909 Unspecified asthma, uncomplicated: Secondary | ICD-10-CM

## 2011-11-04 DIAGNOSIS — J4550 Severe persistent asthma, uncomplicated: Secondary | ICD-10-CM

## 2011-11-04 NOTE — Progress Notes (Signed)
Subjective:    Patient ID: [name] [name], female    DOB: 1940/12/04, 70 y.o.   MRN: [id number]  HPI 1. ASTHMA -  Baseline Chronic High Risk (multiple admits), Allergic, Moderate-Severe Persistent Asthma   - Allergies, gerd, sinus exacerbators.   -  Frequent prednisone -> daily low dose prednisone since FEb 20112 -  PFT 11/06/2010 - Mixed obstruction - restriction - fev1 1L/50%, Ratio 54, 24% BD response, TLC 3.6/78%, DLCO 16/75% - May 2012: Switched asthma care to Dr. Marchelle Gearing - June 2012 PFT - showed normalization with prednisone:  PFts 04/18/2011 shows NORMALIZATION (fev1 1.8L/93%, ratio 68, 9% BD response, DLCO 86).  - August 2012: Fev1: 1.1lL/58% -> prednisone burst-> 1.09L/60%, Ratio 65 (no  - Nov 2012: Fev1 0.97L/51%, Ratio 60 -> pred burst - Dec 2012: Fev1 0.7L/37%, Ratio 67%   2. Bronchiectasis, (mild RUL) seen on CT chest - April 2012 ( ABPA wu tests on prednisone in June 2012: asp antibody IgE asp fumigatus 0.24 ku/L which is borderline/equivocal, IgG for all all aspergiluus preciptins - NEGATIVE. 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  Allergic rhinitis and allergic asthma.   - 2000s: - per hx strongly skin test positive at Dr Lucie Leather office  - 2004 - trialed xolair - per hx effective but developed rash which she subjectively attributed to twice daily dosing (thogh chart review 2004-2005 states it was stopped due to subjective lack of improvement and an incidental rash resolution was noted in retrospect after stopping xolair)  - 2009: IgE 306 in July 2009. RAST positive for various grass, oak, elm, ragweed, plantain, lamb etc., s/p Allergy shot trial with Dr Maple Hudson - stopped due to transport issues - 2012: May - IgE 588.  - 2012: June - moved  allergy MD to Dr Aris Georgia  4. Lung nodule, 6 mm, on CT scan. - April 2012. Possibly calcified. RUL (no prior for comparison but a 07/17/2000 CT chest reports simillar size  nodule in similar location). Repeat CT needed April 2013  5.  Gastroesophageal reflux disease with hx of UGI scope showing Hiatal hernia.     6. History of sleep apnea.- non compliant with CPAP   7. Significant anxiety (also mood lability hx with prednisone, worsening anxiety with albuterol)  8. Recurrent AE-ASthma -  Admitted 4/23-4/30/12 - OPD AE asthma Rx:  July 2012, august 2012, Nov 2012  OV 10/21/2011 Followup difficult asthma. Yet to start xolair; waiting on medicaid pre-cert. Did pred taper after last visit and back to baseline prednisone dose now. Past week increased cough, shortness of breath, wheeze, nocturanl awakening. Rates symptoms as severe. Feels miserable. Denies fever, sputum, edema, chest pain. Fev1 0.7L/37%, Ratio 67% and worst ever.  >>  11/04/2011 Post Hospital  Admitted 12/10-12/17 for status asthmaticus, tx with IV abx, steroids and nebs. Discharged on prednisone and Avelox. Has finished Avelox , has couple of days left on prednisone  She is feeling much better. No chest pain or edema. Wheezing has resolved.  Was unable to tolerate Atrovent neb.      Review of Systems Constitutional:   No  weight loss, night sweats,  Fevers, chills, + fatigue, or  lassitude.  HEENT:   No headaches,  Difficulty swallowing,  Tooth/dental problems, or  Sore throat,                No sneezing, itching, ear ache, nasal congestion, post nasal drip,  CV:  No chest pain,  Orthopnea, PND, swelling in lower extremities, anasarca, dizziness, palpitations, syncope.   GI  No heartburn, indigestion, abdominal pain, nausea, vomiting, diarrhea, change in bowel habits, loss of appetite, bloody stools.   Resp:   No coughing up of blood.    No chest wall deformity  Skin: no rash or lesions.  GU: no dysuria, change in color of urine, no urgency or frequency.  No flank pain, no hematuria   MS:  No joint pain or swelling.  No decreased range of motion.  No back pain.  Psych:  No change  in mood or affect. No depression or anxiety.  No memory loss.         Objective:   Physical Exam GEN: A/Ox3; pleasant , NAD, well nourished   HEENT:  [place]/AT,  EACs-clear, TMs-wnl, NOSE-clear, THROAT-clear, no lesions, no postnasal drip or exudate noted.   NECK:  Supple w/ fair ROM; no JVD; normal carotid impulses w/o bruits; no thyromegaly or nodules palpated; no lymphadenopathy.  RESP  CTA  w/o, wheezes/ rales/ or rhonchi.no accessory muscle use, no dullness to percussion  CARD:  RRR, no m/r/g  , no peripheral edema, pulses intact, no cyanosis or clubbing.  GI:   Soft & nt; nml bowel sounds; no organomegaly or masses detected.  Musco: Warm bil, no deformities or joint swelling noted.   Neuro: alert, no focal deficits noted.    Skin: Warm, no lesions or rashes         Assessment & Plan:

## 2011-11-04 NOTE — Assessment & Plan Note (Addendum)
REcent flare now resolved.   Plan:  Finish prednisone as directed.  Continue Symbicort 2 puffs Twice daily  - Brush /rinse and gargle after use.  follow up Dr. Marchelle Gearing in 1 month as planned and As needed   Please contact office for sooner follow up if symptoms do not improve or worsen or seek emergency care

## 2011-11-04 NOTE — Patient Instructions (Signed)
You are doing great job.  Finish prednisone as directed.  Continue Symbicort 2 puffs Twice daily  - Brush /rinse and gargle after use.  follow up Dr. Marchelle Gearing in 1 month as planned and As needed   Please contact office for sooner follow up if symptoms do not improve or worsen or seek emergency care

## 2011-11-05 ENCOUNTER — Encounter (HOSPITAL_COMMUNITY): Payer: Self-pay

## 2011-11-06 DIAGNOSIS — J449 Chronic obstructive pulmonary disease, unspecified: Secondary | ICD-10-CM

## 2011-11-06 DIAGNOSIS — I10 Essential (primary) hypertension: Secondary | ICD-10-CM

## 2011-11-06 DIAGNOSIS — J441 Chronic obstructive pulmonary disease with (acute) exacerbation: Secondary | ICD-10-CM

## 2011-11-06 DIAGNOSIS — M159 Polyosteoarthritis, unspecified: Secondary | ICD-10-CM

## 2011-11-07 ENCOUNTER — Encounter (HOSPITAL_COMMUNITY): Payer: Self-pay

## 2011-11-07 ENCOUNTER — Encounter: Payer: Self-pay | Admitting: *Deleted

## 2011-11-12 ENCOUNTER — Encounter (HOSPITAL_COMMUNITY): Payer: Self-pay

## 2011-11-12 DIAGNOSIS — J4489 Other specified chronic obstructive pulmonary disease: Secondary | ICD-10-CM | POA: Insufficient documentation

## 2011-11-12 DIAGNOSIS — M810 Age-related osteoporosis without current pathological fracture: Secondary | ICD-10-CM | POA: Insufficient documentation

## 2011-11-12 DIAGNOSIS — K219 Gastro-esophageal reflux disease without esophagitis: Secondary | ICD-10-CM | POA: Insufficient documentation

## 2011-11-12 DIAGNOSIS — Z5189 Encounter for other specified aftercare: Secondary | ICD-10-CM | POA: Insufficient documentation

## 2011-11-12 DIAGNOSIS — G4730 Sleep apnea, unspecified: Secondary | ICD-10-CM | POA: Insufficient documentation

## 2011-11-12 DIAGNOSIS — J449 Chronic obstructive pulmonary disease, unspecified: Secondary | ICD-10-CM | POA: Insufficient documentation

## 2011-11-13 ENCOUNTER — Ambulatory Visit: Payer: Medicare Other | Admitting: Internal Medicine

## 2011-11-13 ENCOUNTER — Telehealth: Payer: Self-pay | Admitting: Internal Medicine

## 2011-11-13 NOTE — Telephone Encounter (Signed)
Pt asked to speak w/ [name] [name].  Will give her written message-this is regarding a bill.  Nothing further needed.  [name] [name]

## 2011-11-14 ENCOUNTER — Encounter (HOSPITAL_COMMUNITY)
Admission: RE | Admit: 2011-11-14 | Discharge: 2011-11-14 | Disposition: A | Discharge status: Home / Self Care | Payer: Medicare Other | Source: Ambulatory Visit | Attending: Internal Medicine | Admitting: Internal Medicine

## 2011-11-14 DIAGNOSIS — N182 Chronic kidney disease, stage 2 (mild): Secondary | ICD-10-CM | POA: Diagnosis not present

## 2011-11-14 LAB — CREATININE CLEARANCE, URINE, 24 HOUR
Creatinine Clearance: 61 mL/min — ABNORMAL LOW (ref 75–115)
Creatinine, 24H Ur: 1182 mg/d (ref 700–1800)

## 2011-11-18 ENCOUNTER — Encounter: Payer: Self-pay | Admitting: Internal Medicine

## 2011-11-18 NOTE — Progress Notes (Signed)
In hospital her spirometry response to Rx was very good and I believe her fev1 rose to >70%. This makes me suspect non-compliance because we have not seen these numbers in the office consistently. When I see her at [facility] will get repeat spiro and get the hospital ones queried. Rest per NP who I agree with

## 2011-11-19 ENCOUNTER — Encounter (HOSPITAL_COMMUNITY)
Admission: RE | Admit: 2011-11-19 | Discharge: 2011-11-19 | Disposition: A | Discharge status: Home / Self Care | Payer: Self-pay | Source: Ambulatory Visit | Attending: Internal Medicine | Admitting: Internal Medicine

## 2011-11-21 ENCOUNTER — Encounter (HOSPITAL_COMMUNITY)
Admission: RE | Admit: 2011-11-21 | Discharge: 2011-11-21 | Disposition: A | Discharge status: Home / Self Care | Payer: Self-pay | Source: Ambulatory Visit | Attending: Internal Medicine | Admitting: Internal Medicine

## 2011-11-22 ENCOUNTER — Ambulatory Visit: Payer: Medicare Other | Admitting: Internal Medicine

## 2011-11-25 ENCOUNTER — Ambulatory Visit (INDEPENDENT_AMBULATORY_CARE_PROVIDER_SITE_OTHER): Payer: Medicare Other | Admitting: Internal Medicine

## 2011-11-25 ENCOUNTER — Other Ambulatory Visit (INDEPENDENT_AMBULATORY_CARE_PROVIDER_SITE_OTHER): Payer: Medicare Other

## 2011-11-25 ENCOUNTER — Telehealth: Payer: Self-pay | Admitting: Internal Medicine

## 2011-11-25 VITALS — BP 138/78 | HR 69 | Temp 98.0°F | Wt 187.0 lb

## 2011-11-25 DIAGNOSIS — G4762 Sleep related leg cramps: Secondary | ICD-10-CM

## 2011-11-25 LAB — POTASSIUM: Potassium: 3.8 mEq/L (ref 3.5–5.1)

## 2011-11-25 MED ORDER — GABAPENTIN 100 MG PO CAPS: ORAL_CAPSULE | ORAL | Status: DC

## 2011-11-25 NOTE — Assessment & Plan Note (Signed)
Nocturnal leg cramps. Will check potassium  Plan - gabapentin 100 mg qhs and may advance to 300 mg.

## 2011-11-25 NOTE — Patient Instructions (Signed)
Leg cramps - may be potassium or magnesium. Will check labs today. For control will start gabapentin 100 mg at bedtime. After 5 days may increase the dose to 200 mg and may go as high as 300 mg if needed.    Leg Cramps Leg cramps that occur during exercise can be caused by poor circulation or dehydration. However, muscle cramps that occur at rest or during the night are usually not due to any serious medical problem. Heat cramps may cause muscle spasms during hot weather.   CAUSES There is no clear cause for muscle cramps. However, dehydration may be a factor for those who do not drink enough fluids and those who exercise in the heat. Imbalances in the level of sodium, potassium, calcium or magnesium in the muscle tissue may also be a factor. Some medications, such as water pills (diuretics), may cause loss of chemicals that the body needs (like sodium and potassium) and cause muscle cramps. TREATMENT    Make sure your diet has enough fluids and essential minerals for the muscle to work normally.     Avoid strenuous exercise for several days if you have been having frequent leg cramps.     Stretch and massage the cramped muscle for several minutes.     Some medicines may be helpful in some patients with night cramps. Only take over-the-counter or prescription medicines as directed by your caregiver.  SEEK IMMEDIATE MEDICAL CARE IF:    Your leg cramps become worse.     Your foot becomes cold, numb, or blue.  Document Released: 12/05/2004 Document Revised: 07/10/2011 Document Reviewed: 11/22/2008 [facility] Patient Information 2012 [place], [place].

## 2011-11-25 NOTE — Progress Notes (Signed)
  Subjective:    Patient ID: [name] [name], female    DOB: June 05, 1941, 71 y.o.   MRN: [id number]  HPI [name] presents for nocturnal leg cramps which she rates as severe. During her hospitalization Dec 10-17th she had a normal Potassium at 4.1 She is taking no medication.   I have reviewed the patient's medical history in detail and updated the computerized patient record.  Review of Systems System review is negative for any constitutional, cardiac, pulmonary, GI or neuro symptoms or complaints other than as described in the HPI.     Objective:   Physical Exam Filed Vitals:   11/25/11 1030  BP: 138/78  Pulse: 69  Temp: 98 F (36.7 C)   WNWD AA womn in no distress Pulm - no increased work of breathing no wheezing Cor - RRR Ext - negaitve Homan's, no masses in the calve       Assessment & Plan:

## 2011-11-25 NOTE — Telephone Encounter (Signed)
Pt walked in requesting sample of Symbicort 160 .  1 sample given to the patient.

## 2011-11-26 ENCOUNTER — Encounter (HOSPITAL_COMMUNITY)
Admission: RE | Admit: 2011-11-26 | Discharge: 2011-11-26 | Disposition: A | Discharge status: Home / Self Care | Payer: Self-pay | Source: Ambulatory Visit | Attending: Internal Medicine | Admitting: Internal Medicine

## 2011-11-28 ENCOUNTER — Encounter (HOSPITAL_COMMUNITY)
Admission: RE | Admit: 2011-11-28 | Discharge: 2011-11-28 | Disposition: A | Discharge status: Home / Self Care | Payer: Self-pay | Source: Ambulatory Visit | Attending: Internal Medicine | Admitting: Internal Medicine

## 2011-12-02 ENCOUNTER — Telehealth: Payer: Self-pay | Admitting: *Deleted

## 2011-12-02 NOTE — Telephone Encounter (Signed)
Notified pt that her Mg & K results came back normal.

## 2011-12-03 ENCOUNTER — Ambulatory Visit (INDEPENDENT_AMBULATORY_CARE_PROVIDER_SITE_OTHER): Payer: Medicare Other | Admitting: Adult Health

## 2011-12-03 ENCOUNTER — Encounter (HOSPITAL_COMMUNITY): Admission: RE | Admit: 2011-12-03 | Payer: Self-pay | Source: Ambulatory Visit

## 2011-12-03 ENCOUNTER — Encounter: Payer: Self-pay | Admitting: Adult Health

## 2011-12-03 VITALS — BP 142/78 | HR 76 | Temp 96.8°F | Ht 64.0 in | Wt 186.0 lb

## 2011-12-03 DIAGNOSIS — J4550 Severe persistent asthma, uncomplicated: Secondary | ICD-10-CM

## 2011-12-03 DIAGNOSIS — J45909 Unspecified asthma, uncomplicated: Secondary | ICD-10-CM | POA: Diagnosis not present

## 2011-12-03 MED ORDER — HYDROCODONE-HOMATROPINE 5-1.5 MG/5ML PO SYRP: 5.0000 mL | ORAL_SOLUTION | Freq: Four times a day (QID) | ORAL | Status: AC | PRN

## 2011-12-03 MED ORDER — PREDNISONE 10 MG PO TABS: ORAL_TABLET | ORAL | Status: DC

## 2011-12-03 MED ORDER — AZITHROMYCIN 250 MG PO TABS: ORAL_TABLET | ORAL | Status: AC

## 2011-12-03 NOTE — Assessment & Plan Note (Signed)
Flare  Plan Take zpak as directed.  Take prednisone 40 mg daily x 2 days, then 20mg  daily x 2 days, then 10mg  daily x 2 days, then 5mg  daily   Hydromet 1-2 tsp every 6 hr As needed  Cough -may make you sleepy.  Clarinex daily As needed  Drainage.  Please contact office for sooner follow up if symptoms do not improve or worsen or seek emergency care  follow up Dr. Marchelle Gearing next week as planned

## 2011-12-03 NOTE — Progress Notes (Signed)
Subjective:    Patient ID: [name] [name], female    DOB: 08/24/1941, 71 y.o.   MRN: [id number]  HPI  1. ASTHMA -  Baseline Chronic High Risk (multiple admits), Allergic, Moderate-Severe Persistent Asthma   - Allergies, gerd, sinus exacerbators.   -  Frequent prednisone -> daily low dose prednisone since FEb 20112 -  PFT 11/06/2010 - Mixed obstruction - restriction - fev1 1L/50%, Ratio 54, 24% BD response, TLC 3.6/78%, DLCO 16/75% - May 2012: Switched asthma care to Dr. Marchelle Gearing - June 2012 PFT - showed normalization with prednisone:  PFts 04/18/2011 shows NORMALIZATION (fev1 1.8L/93%, ratio 68, 9% BD response, DLCO 86).  - August 2012: Fev1: 1.1lL/58% -> prednisone burst-> 1.09L/60%, Ratio 65 (no  - Nov 2012: Fev1 0.97L/51%, Ratio 60 -> pred burst - Dec 2012: Fev1 0.7L/37%, Ratio 67%   2. Bronchiectasis, (mild RUL) seen on CT chest - April 2012 ( ABPA wu tests on prednisone in June 2012: asp antibody IgE asp fumigatus 0.24 ku/L which is borderline/equivocal, IgG for all all aspergiluus preciptins - NEGATIVE. 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  Allergic rhinitis and allergic asthma.   - 2000s: - per hx strongly skin test positive at Dr Lucie Leather office  - 2004 - trialed xolair - per hx effective but developed rash which she subjectively attributed to twice daily dosing (thogh chart review 2004-2005 states it was stopped due to subjective lack of improvement and an incidental rash resolution was noted in retrospect after stopping xolair)  - 2009: IgE 306 in July 2009. RAST positive for various grass, oak, elm, ragweed, plantain, lamb etc., s/p Allergy shot trial with Dr Maple Hudson - stopped due to transport issues - 2012: May - IgE 588.  - 2012: June - moved  allergy MD to Dr Aris Georgia  4. Lung nodule, 6 mm, on CT scan. - April 2012. Possibly calcified. RUL (no prior for comparison but a 07/17/2000 CT chest reports simillar size  nodule in similar location). Repeat CT needed April 2013  5.  Gastroesophageal reflux disease with hx of UGI scope showing Hiatal hernia.     6. History of sleep apnea.- non compliant with CPAP   7. Significant anxiety (also mood lability hx with prednisone, worsening anxiety with albuterol)  8. Recurrent AE-ASthma -  Admitted 4/23-4/30/12 - OPD AE asthma Rx:  July 2012, august 2012, Nov 2012  OV 10/21/2011 Followup difficult asthma. Yet to start xolair; waiting on medicaid pre-cert. Did pred taper after last visit and back to baseline prednisone dose now. Past week increased cough, shortness of breath, wheeze, nocturanl awakening. Rates symptoms as severe. Feels miserable. Denies fever, sputum, edema, chest pain. Fev1 0.7L/37%, Ratio 67% and worst ever.  >>  11/04/11  Post Hospital  Admitted 12/10-12/17 for status asthmaticus, tx with IV abx, steroids and nebs. Discharged on prednisone and Avelox. Has finished Avelox , has couple of days left on prednisone  She is feeling much better. No chest pain or edema. Wheezing has resolved.  Was unable to tolerate Atrovent neb.  >>no changes  12/03/2011 Acute OV Complains of prod cough with yellow mucus, increased SOB, wheezing, hoarseness x 2 days.  Does not feel good.  Currently on prednisone 5mg  daily . Remains on Symbicort 2 puffs Twice daily  - no missed doses.  No otc used.  No hemotpysis  , no n/v/d or edema.  Had trouble at pulmonary rehab today with  wearing out.    Review of Systems  Constitutional:   No  weight loss, night sweats,  Fevers, chills, + fatigue, or  lassitude.  HEENT:   No headaches,  Difficulty swallowing,  Tooth/dental problems, or  Sore throat,                No sneezing, itching, ear ache, + nasal congestion, post nasal drip,   CV:  No chest pain,  Orthopnea, PND, swelling in lower extremities, anasarca, dizziness, palpitations, syncope.   GI  No heartburn, indigestion, abdominal pain, nausea, vomiting,  diarrhea, change in bowel habits, loss of appetite, bloody stools.   Resp:   No coughing up of blood.    No chest wall deformity  Skin: no rash or lesions.  GU: no dysuria, change in color of urine, no urgency or frequency.  No flank pain, no hematuria   MS:  No joint pain or swelling.  No decreased range of motion.  No back pain.  Psych:  No change in mood or affect. No depression or anxiety.  No memory loss.         Objective:   Physical Exam  GEN: A/Ox3; pleasant , NAD   HEENT:  [place]/AT,  EACs-clear, TMs-wnl, NOSE-clear, THROAT-clear, no lesions, no postnasal drip or exudate noted.   NECK:  Supple w/ fair ROM; no JVD; normal carotid impulses w/o bruits; no thyromegaly or nodules palpated; no lymphadenopathy.  RESP  Coarse BS no accessory muscle use, no dullness to percussion, no wheezing  CARD:  RRR, no m/r/g  , no peripheral edema, pulses intact, no cyanosis or clubbing.  GI:   Soft & nt; nml bowel sounds; no organomegaly or masses detected.  Musco: Warm bil, no deformities or joint swelling noted.   Neuro: alert, no focal deficits noted.    Skin: Warm, no lesions or rashes         Assessment & Plan:

## 2011-12-03 NOTE — Patient Instructions (Signed)
Take zpak as directed.  Take prednisone 40 mg daily x 2 days, then 20mg  daily x 2 days, then 10mg  daily x 2 days, then 5mg  daily   Hydromet 1-2 tsp every 6 hr As needed  Cough -may make you sleepy.  Clarinex daily As needed  Drainage.  Please contact office for sooner follow up if symptoms do not improve or worsen or seek emergency care  follow up Dr. Marchelle Gearing next week as planned

## 2011-12-05 ENCOUNTER — Encounter (HOSPITAL_COMMUNITY): Payer: Self-pay

## 2011-12-10 ENCOUNTER — Ambulatory Visit (INDEPENDENT_AMBULATORY_CARE_PROVIDER_SITE_OTHER): Payer: Medicare Other | Admitting: Internal Medicine

## 2011-12-10 ENCOUNTER — Encounter: Payer: Self-pay | Admitting: Internal Medicine

## 2011-12-10 ENCOUNTER — Encounter (HOSPITAL_COMMUNITY): Payer: Medicare Other

## 2011-12-10 VITALS — BP 140/88 | HR 72 | Temp 98.0°F | Ht 63.0 in | Wt 189.0 lb

## 2011-12-10 DIAGNOSIS — J45909 Unspecified asthma, uncomplicated: Secondary | ICD-10-CM

## 2011-12-10 DIAGNOSIS — J4550 Severe persistent asthma, uncomplicated: Secondary | ICD-10-CM

## 2011-12-10 LAB — PULMONARY FUNCTION TEST

## 2011-12-10 NOTE — Progress Notes (Signed)
PFT done today. 

## 2011-12-10 NOTE — Patient Instructions (Signed)
Continue your asthma medications as before [name] will test your coordination with symbicort Please do not miss any doses WE will check if you can start your xolair today; ok from my standpoint Return in 2-3 months or sooner if needed

## 2011-12-10 NOTE — Progress Notes (Signed)
Subjective:    Patient ID: [name] [name], female    DOB: 09-26-1941, 71 y.o.   MRN: [id number]  HPI 1. ASTHMA -  Baseline Chronic High Risk (multiple admits), Allergic, Moderate-Severe Persistent Asthma   - Allergies, gerd, sinus exacerbators.   -  Frequent prednisone -> daily low dose prednisone since FEb 20112 -  PFT 11/06/2010 - Mixed obstruction - restriction - fev1 1L/50%, Ratio 54, 24% BD response, TLC 3.6/78%, DLCO 16/75% - May 2012: Switched asthma care to Dr. Marchelle Gearing - June 2012 PFT - showed normalization with prednisone:  PFts 04/18/2011 shows NORMALIZATION (fev1 1.8L/93%, ratio 68, 9% BD response, DLCO 86).  - August 2012: Fev1: 1.1lL/58% -> prednisone burst-> 1.09L/60%, Ratio 65 (no  - Nov 2012: Fev1 0.97L/51%, Ratio 60 -> pred burst - Dec 2012: Fev1 0.7L/37%, Ratio 67% - Jan 2013: Full PFTs fev1 1.97L/103%, Ratio 75, DLC 74%, - BEST EVER (1 day post short pred course)    2. Bronchiectasis, (mild RUL) seen on CT chest - April 2012 ( ABPA wu tests on prednisone in June 2012: asp antibody IgE asp fumigatus 0.24 ku/L which is borderline/equivocal, IgG for all all aspergiluus preciptins - NEGATIVE. 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  Allergic rhinitis and allergic asthma.   - 2000s: - per hx strongly skin test positive at Dr Lucie Leather office  - 2004 - trialed xolair - per hx effective but developed rash which she subjectively attributed to twice daily dosing (thogh chart review 2004-2005 states it was stopped due to subjective lack of improvement and an incidental rash resolution was noted in retrospect after stopping xolair)  - 2009: IgE 306 in July 2009. RAST positive for various grass, oak, elm, ragweed, plantain, lamb etc., s/p Allergy shot trial with Dr Maple Hudson - stopped due to transport issues - 2012: May - IgE 588.  - 2012: June - moved  allergy MD to Dr Aris Georgia  4. Lung nodule, 6 mm, on CT scan. - April  2012. Possibly calcified. RUL (no prior for comparison but a 07/17/2000 CT chest reports simillar size nodule in similar location). Repeat CT needed April 2013  5.  Gastroesophageal reflux disease with hx of UGI scope showing Hiatal hernia.     6. History of sleep apnea.- non compliant with CPAP   7. Significant anxiety (also mood lability hx with prednisone, worsening anxiety with albuterol)  8. Recurrent AE-ASthma -  Admitted 4/23-4/30/12, 12/10-12/17/12 - OPD AE asthma Rx:  July 2012, august 2012, Nov 2012, Jan 2013    OV 12/10/2011 Followup asthma. Saw NP 12/03/11 with mild AE-asthma. Took prednisone and antibiotic; completed yesterday. Feels reall well and back to normal. Had full PFT today and this is normal. This is first time we are seeing normal PFT and normalization of what we have considered as severe persistent asthma even at baseline lung function. We noticed this during hospitalization in dec 2012 as well. She insists she is compliant with symbicort 160 and singulair and prednisone 5mg  daily. She insists mdi technique is good. She is not interested in trying spiriva at this point ('helped but blisters' ) in mouth. She is not interested in upping ics to dulera high dose. She has not yet started xolair but is ready to do it today. No interim complaints.  Her rehab program is ending but she wants to continue  Past, Family, Social reviewed: no change since last visit eccept as above  Review of Systems  Constitutional: Negative for fever and unexpected weight change.  HENT: Negative for ear pain, nosebleeds, congestion, sore throat, rhinorrhea, sneezing, trouble swallowing, dental problem, postnasal drip and sinus pressure.   Eyes: Negative for redness and itching.  Respiratory: Negative for cough, chest tightness, shortness of breath and wheezing.   Cardiovascular: Negative for palpitations and leg swelling.  Gastrointestinal: Negative for nausea and vomiting.  Genitourinary:  Negative for dysuria.  Musculoskeletal: Negative for joint swelling.  Skin: Negative for rash.  Neurological: Negative for headaches.  Hematological: Does not bruise/bleed easily.  Psychiatric/Behavioral: Negative for dysphoric mood. The patient is not nervous/anxious.        Objective:   Physical Exam GEN: A/Ox3; pleasant , NAD   HEENT:  [place]/AT,  EACs-clear, TMs-wnl, NOSE-clear, THROAT-clear, no lesions, no postnasal drip or exudate noted.   NECK:  Supple w/ fair ROM; no JVD; normal carotid impulses w/o bruits; no thyromegaly or nodules palpated; no lymphadenopathy.  RESP  Coarse BS no accessory muscle use, no dullness to percussion, no wheezing  CARD:  RRR, no m/r/g  , no peripheral edema, pulses intact, no cyanosis or clubbing.  GI:   Soft & nt; nml bowel sounds; no organomegaly or masses detected.  Musco: Warm bil, no deformities or joint swelling noted.   Neuro: alert, no focal deficits noted.    Skin: Warm, no lesions or rashes         Assessment & Plan:

## 2011-12-10 NOTE — Assessment & Plan Note (Signed)
PFTs have normalized after a short prednisone burst. We saw this same response in dec 2012 hospitilization. I confronted her about compliance but  she insists she is 100% compliant with symbicort, singulair and daily prednisone. I asked her about technique and nurse later checked on technique. We discussed changing symbicort to high dose dulera or starting spiriva but she is not keen on either option. She is more interested in starting xolair today and monitoring her lung function

## 2011-12-11 ENCOUNTER — Ambulatory Visit (INDEPENDENT_AMBULATORY_CARE_PROVIDER_SITE_OTHER): Payer: Medicare Other

## 2011-12-11 DIAGNOSIS — J45909 Unspecified asthma, uncomplicated: Secondary | ICD-10-CM

## 2011-12-12 ENCOUNTER — Encounter (HOSPITAL_COMMUNITY)
Admission: RE | Admit: 2011-12-12 | Discharge: 2011-12-12 | Disposition: A | Discharge status: Home / Self Care | Payer: Medicare Other | Source: Ambulatory Visit | Attending: Internal Medicine | Admitting: Internal Medicine

## 2011-12-12 ENCOUNTER — Encounter: Payer: Self-pay | Admitting: Internal Medicine

## 2011-12-12 DIAGNOSIS — J45909 Unspecified asthma, uncomplicated: Secondary | ICD-10-CM

## 2011-12-12 MED ORDER — OMALIZUMAB 150 MG ~~LOC~~ SOLR
300.0000 mg | Freq: Once | SUBCUTANEOUS | Status: AC
  Administered 2011-12-12: 300 mg via SUBCUTANEOUS

## 2011-12-17 ENCOUNTER — Encounter (HOSPITAL_COMMUNITY): Payer: Medicare Other

## 2011-12-17 DIAGNOSIS — J449 Chronic obstructive pulmonary disease, unspecified: Secondary | ICD-10-CM | POA: Insufficient documentation

## 2011-12-17 DIAGNOSIS — K219 Gastro-esophageal reflux disease without esophagitis: Secondary | ICD-10-CM | POA: Insufficient documentation

## 2011-12-17 DIAGNOSIS — G4730 Sleep apnea, unspecified: Secondary | ICD-10-CM | POA: Insufficient documentation

## 2011-12-17 DIAGNOSIS — J4489 Other specified chronic obstructive pulmonary disease: Secondary | ICD-10-CM | POA: Insufficient documentation

## 2011-12-17 DIAGNOSIS — M810 Age-related osteoporosis without current pathological fracture: Secondary | ICD-10-CM | POA: Insufficient documentation

## 2011-12-17 DIAGNOSIS — Z5189 Encounter for other specified aftercare: Secondary | ICD-10-CM | POA: Insufficient documentation

## 2011-12-18 ENCOUNTER — Other Ambulatory Visit: Payer: Self-pay

## 2011-12-18 MED ORDER — CYCLOSPORINE 0.05 % OP EMUL: 1.0000 [drp] | Freq: Two times a day (BID) | OPHTHALMIC | Status: DC

## 2011-12-19 ENCOUNTER — Encounter (HOSPITAL_COMMUNITY): Payer: Medicare Other

## 2011-12-24 ENCOUNTER — Encounter (HOSPITAL_COMMUNITY)
Admission: RE | Admit: 2011-12-24 | Discharge: 2011-12-24 | Disposition: A | Discharge status: Home / Self Care | Payer: Medicare Other | Source: Ambulatory Visit | Attending: Internal Medicine | Admitting: Internal Medicine

## 2011-12-26 ENCOUNTER — Encounter (HOSPITAL_COMMUNITY)
Admission: RE | Admit: 2011-12-26 | Discharge: 2011-12-26 | Disposition: A | Discharge status: Home / Self Care | Payer: Medicare Other | Source: Ambulatory Visit | Attending: Internal Medicine | Admitting: Internal Medicine

## 2011-12-31 ENCOUNTER — Encounter (HOSPITAL_COMMUNITY)
Admission: RE | Admit: 2011-12-31 | Discharge: 2011-12-31 | Disposition: A | Discharge status: Home / Self Care | Payer: Medicare Other | Source: Ambulatory Visit | Attending: Internal Medicine | Admitting: Internal Medicine

## 2012-01-02 ENCOUNTER — Encounter (HOSPITAL_COMMUNITY)
Admission: RE | Admit: 2012-01-02 | Discharge: 2012-01-02 | Disposition: A | Discharge status: Home / Self Care | Payer: Medicare Other | Source: Ambulatory Visit | Attending: Internal Medicine | Admitting: Internal Medicine

## 2012-01-06 ENCOUNTER — Telehealth: Payer: Self-pay

## 2012-01-06 MED ORDER — CYCLOSPORINE 0.05 % OP EMUL: 1.0000 [drp] | Freq: Two times a day (BID) | OPHTHALMIC | Status: DC

## 2012-01-06 NOTE — Telephone Encounter (Signed)
Patient called triage [facility]OVM requesting refill for eye drops to be sent to [facility] [address]. Refill sent e-script

## 2012-01-07 ENCOUNTER — Encounter (HOSPITAL_COMMUNITY)
Admission: RE | Admit: 2012-01-07 | Discharge: 2012-01-07 | Disposition: A | Discharge status: Home / Self Care | Payer: Medicare Other | Source: Ambulatory Visit | Attending: Internal Medicine | Admitting: Internal Medicine

## 2012-01-09 ENCOUNTER — Encounter (HOSPITAL_COMMUNITY)
Admission: RE | Admit: 2012-01-09 | Discharge: 2012-01-09 | Disposition: A | Discharge status: Home / Self Care | Payer: Medicare Other | Source: Ambulatory Visit | Attending: Internal Medicine | Admitting: Internal Medicine

## 2012-01-09 ENCOUNTER — Ambulatory Visit (INDEPENDENT_AMBULATORY_CARE_PROVIDER_SITE_OTHER): Payer: Medicare Other

## 2012-01-09 DIAGNOSIS — J45909 Unspecified asthma, uncomplicated: Secondary | ICD-10-CM

## 2012-01-10 DIAGNOSIS — J45909 Unspecified asthma, uncomplicated: Secondary | ICD-10-CM | POA: Diagnosis not present

## 2012-01-10 MED ORDER — OMALIZUMAB 150 MG ~~LOC~~ SOLR
300.0000 mg | Freq: Once | SUBCUTANEOUS | Status: AC
  Administered 2012-01-10: 300 mg via SUBCUTANEOUS

## 2012-01-13 ENCOUNTER — Telehealth: Payer: Self-pay | Admitting: Internal Medicine

## 2012-01-13 ENCOUNTER — Encounter: Payer: Self-pay | Admitting: Gastroenterology

## 2012-01-14 ENCOUNTER — Encounter (HOSPITAL_COMMUNITY)
Admission: RE | Admit: 2012-01-14 | Discharge: 2012-01-14 | Disposition: A | Discharge status: Home / Self Care | Payer: Medicare Other | Source: Ambulatory Visit | Attending: Internal Medicine | Admitting: Internal Medicine

## 2012-01-14 DIAGNOSIS — Z5189 Encounter for other specified aftercare: Secondary | ICD-10-CM | POA: Insufficient documentation

## 2012-01-14 DIAGNOSIS — J449 Chronic obstructive pulmonary disease, unspecified: Secondary | ICD-10-CM | POA: Insufficient documentation

## 2012-01-14 DIAGNOSIS — M810 Age-related osteoporosis without current pathological fracture: Secondary | ICD-10-CM | POA: Insufficient documentation

## 2012-01-14 DIAGNOSIS — J4489 Other specified chronic obstructive pulmonary disease: Secondary | ICD-10-CM | POA: Insufficient documentation

## 2012-01-14 DIAGNOSIS — G4730 Sleep apnea, unspecified: Secondary | ICD-10-CM | POA: Insufficient documentation

## 2012-01-14 DIAGNOSIS — K219 Gastro-esophageal reflux disease without esophagitis: Secondary | ICD-10-CM | POA: Insufficient documentation

## 2012-01-14 NOTE — Telephone Encounter (Signed)
Pt returned call. i advised there were no samples of symbicort 160. Pt said 'thanks" and will check back later. [name] [name]

## 2012-01-14 NOTE — Telephone Encounter (Signed)
[email or web address] -- when I looked for samples, we are currently out of symbicort 160.

## 2012-01-15 NOTE — Telephone Encounter (Signed)
Nothing further needed . Will close telephone note.

## 2012-01-16 ENCOUNTER — Encounter (HOSPITAL_COMMUNITY): Payer: Medicare Other

## 2012-01-21 ENCOUNTER — Encounter: Payer: Self-pay | Admitting: Internal Medicine

## 2012-01-21 ENCOUNTER — Encounter (HOSPITAL_COMMUNITY): Payer: Medicare Other

## 2012-01-21 ENCOUNTER — Ambulatory Visit (INDEPENDENT_AMBULATORY_CARE_PROVIDER_SITE_OTHER): Payer: Medicare Other | Admitting: Internal Medicine

## 2012-01-21 DIAGNOSIS — J45909 Unspecified asthma, uncomplicated: Secondary | ICD-10-CM | POA: Diagnosis not present

## 2012-01-21 DIAGNOSIS — J019 Acute sinusitis, unspecified: Secondary | ICD-10-CM | POA: Diagnosis not present

## 2012-01-21 DIAGNOSIS — J4550 Severe persistent asthma, uncomplicated: Secondary | ICD-10-CM

## 2012-01-21 MED ORDER — HYDROCODONE-ACETAMINOPHEN 7.5-750 MG PO TABS: 1.0000 | ORAL_TABLET | Freq: Four times a day (QID) | ORAL | Status: DC | PRN

## 2012-01-21 MED ORDER — OLOPATADINE HCL 0.6 % NA SOLN: 2.0000 | Freq: Every day | NASAL | Status: DC

## 2012-01-21 MED ORDER — AZITHROMYCIN 500 MG PO TABS: 500.0000 mg | ORAL_TABLET | Freq: Every day | ORAL | Status: AC

## 2012-01-21 MED ORDER — MUPIROCIN CALCIUM 2 % NA OINT: TOPICAL_OINTMENT | Freq: Two times a day (BID) | NASAL | Status: AC

## 2012-01-21 NOTE — Assessment & Plan Note (Signed)
Patient with exacerbation of bronchiectasis with increased wheezing.  Plan - azithromycin 500 mg qd x 3,           Prednisone 40 x 3, 30 x 3, 210 x 6 10           Continue all your regular medications.

## 2012-01-21 NOTE — Patient Instructions (Signed)
Acute exacerbation of bronchiectasis with increased wheezing. Plan is to take azithromycin 500 mg once a day for 3 days, continue all you respiratory medications except hold the Patanase spray until nose is better, increase your prednisone to 40 mg daily x 3 days, 30 mg daily x 3 days, 20 mg daily x 6 days and then back to 10 mg daily.  Nosebleeds - there is an infection/ulcerations in the left nostril. Plan - apply a small amount of bactroban nasal ointment three times a day to both nostrils.
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
## 2012-01-23 ENCOUNTER — Encounter (HOSPITAL_COMMUNITY)
Admission: RE | Admit: 2012-01-23 | Discharge: 2012-01-23 | Disposition: A | Discharge status: Home / Self Care | Payer: Medicare Other | Source: Ambulatory Visit | Attending: Internal Medicine | Admitting: Internal Medicine

## 2012-01-28 ENCOUNTER — Encounter (HOSPITAL_COMMUNITY): Payer: Medicare Other

## 2012-01-30 ENCOUNTER — Encounter (HOSPITAL_COMMUNITY): Payer: Medicare Other

## 2012-02-04 ENCOUNTER — Encounter (HOSPITAL_COMMUNITY)
Admission: RE | Admit: 2012-02-04 | Discharge: 2012-02-04 | Disposition: A | Discharge status: Home / Self Care | Payer: Medicare Other | Source: Ambulatory Visit | Attending: Internal Medicine | Admitting: Internal Medicine

## 2012-02-06 ENCOUNTER — Encounter (HOSPITAL_COMMUNITY)
Admission: RE | Admit: 2012-02-06 | Discharge: 2012-02-06 | Disposition: A | Discharge status: Home / Self Care | Payer: Medicare Other | Source: Ambulatory Visit | Attending: Internal Medicine | Admitting: Internal Medicine

## 2012-02-11 ENCOUNTER — Encounter (HOSPITAL_COMMUNITY)
Admission: RE | Admit: 2012-02-11 | Discharge: 2012-02-11 | Disposition: A | Discharge status: Home / Self Care | Payer: Self-pay | Source: Ambulatory Visit | Attending: Internal Medicine | Admitting: Internal Medicine

## 2012-02-11 DIAGNOSIS — Z5189 Encounter for other specified aftercare: Secondary | ICD-10-CM | POA: Insufficient documentation

## 2012-02-11 DIAGNOSIS — K219 Gastro-esophageal reflux disease without esophagitis: Secondary | ICD-10-CM | POA: Insufficient documentation

## 2012-02-11 DIAGNOSIS — G4730 Sleep apnea, unspecified: Secondary | ICD-10-CM | POA: Insufficient documentation

## 2012-02-11 DIAGNOSIS — M810 Age-related osteoporosis without current pathological fracture: Secondary | ICD-10-CM | POA: Insufficient documentation

## 2012-02-11 DIAGNOSIS — J449 Chronic obstructive pulmonary disease, unspecified: Secondary | ICD-10-CM | POA: Insufficient documentation

## 2012-02-11 DIAGNOSIS — J4489 Other specified chronic obstructive pulmonary disease: Secondary | ICD-10-CM | POA: Insufficient documentation

## 2012-02-12 ENCOUNTER — Ambulatory Visit (INDEPENDENT_AMBULATORY_CARE_PROVIDER_SITE_OTHER): Payer: Medicare Other

## 2012-02-12 ENCOUNTER — Ambulatory Visit (AMBULATORY_SURGERY_CENTER): Payer: Medicare Other

## 2012-02-12 VITALS — Ht 63.5 in | Wt 192.4 lb

## 2012-02-12 DIAGNOSIS — Z1211 Encounter for screening for malignant neoplasm of colon: Secondary | ICD-10-CM

## 2012-02-12 DIAGNOSIS — J45909 Unspecified asthma, uncomplicated: Secondary | ICD-10-CM

## 2012-02-12 MED ORDER — PEG-KCL-NACL-NASULF-NA ASC-C 100 G PO SOLR: 1.0000 | Freq: Once | ORAL | Status: DC

## 2012-02-13 ENCOUNTER — Encounter (HOSPITAL_COMMUNITY)
Admission: RE | Admit: 2012-02-13 | Discharge: 2012-02-13 | Disposition: A | Discharge status: Home / Self Care | Payer: Self-pay | Source: Ambulatory Visit | Attending: Internal Medicine | Admitting: Internal Medicine

## 2012-02-13 ENCOUNTER — Telehealth: Payer: Self-pay | Admitting: *Deleted

## 2012-02-13 DIAGNOSIS — J45909 Unspecified asthma, uncomplicated: Secondary | ICD-10-CM

## 2012-02-13 MED ORDER — OMALIZUMAB 150 MG ~~LOC~~ SOLR
300.0000 mg | Freq: Once | SUBCUTANEOUS | Status: AC
  Administered 2012-02-13: 300 mg via SUBCUTANEOUS

## 2012-02-13 NOTE — Progress Notes (Signed)
Resting heart rate noted between 51-56 at pulmonary rehab.  Patient placed on Zoll ECG tracing shows Sinus Brady 50. [name] [name] is asymptomatic currently but reports feeling tired over the past few weeks. Will send Zoll tracing and exercise flow sheets from Pulmonary rehab for Dr Debby Bud to review

## 2012-02-13 NOTE — Telephone Encounter (Signed)
Caller stated concerns about pt's heart rate in rehab [51] and fatigued x2 wks and the fact pt is not on Beta Blocker. Caller faxed over Notes & Flowsheets w/copy in Careers information officer for MEN/Dr Norins is aware and request patient to have OV for A&E. Called patient & LMOM with this information and informed pt that we are willing to work her into our schedule per VO MEN/SLS

## 2012-02-14 ENCOUNTER — Telehealth: Payer: Self-pay | Admitting: Internal Medicine

## 2012-02-14 NOTE — Telephone Encounter (Signed)
Message copied by Newell Coral on Fri Feb 14, 2012  8:30 AM ------      Message from: Illene Regulus E      Created: Thu Feb 13, 2012  5:53 PM      Regarding: RE: Sinus Huston Foley at [facility]       Will schedule patient for ov      ----- Message -----         From: Thayer Headings, RN         Sent: 02/13/2012  10:18 AM           To: Jacques Navy, MD, Regis Bill, CMA      Subject: Sinus Huston Foley at [facility]                           Dr Debby Bud,            I just want to bring it to your attention that Carols resting heart rate has been in the mid 50's at recent exercise sessions at pulmonary rehab. The pulse ox read Carols heart rate at 51. Patient placed on Zoll confirmed Sinus Brady 50. Nasya is on amlodipine for HTN. Alizae is asymptomatic today at exercise but she reports feeling tired over the past few weeks. Carols heart rate was in the 80's when she saw you in March. Hospital 12 lead showed Sinus Rhythm from December.            Thank you for review             Gladstone Lighter RN

## 2012-02-14 NOTE — Telephone Encounter (Signed)
Lvmom for pt to call back.

## 2012-02-17 ENCOUNTER — Encounter: Payer: Self-pay | Admitting: Internal Medicine

## 2012-02-17 ENCOUNTER — Other Ambulatory Visit: Payer: Self-pay | Admitting: Gastroenterology

## 2012-02-17 ENCOUNTER — Ambulatory Visit (INDEPENDENT_AMBULATORY_CARE_PROVIDER_SITE_OTHER): Payer: Medicare Other | Admitting: Internal Medicine

## 2012-02-17 ENCOUNTER — Telehealth: Payer: Self-pay | Admitting: Internal Medicine

## 2012-02-17 VITALS — BP 124/78 | HR 66 | Temp 98.3°F | Resp 16 | Wt 193.5 lb

## 2012-02-17 DIAGNOSIS — R001 Bradycardia, unspecified: Secondary | ICD-10-CM

## 2012-02-17 DIAGNOSIS — I498 Other specified cardiac arrhythmias: Secondary | ICD-10-CM

## 2012-02-17 DIAGNOSIS — R911 Solitary pulmonary nodule: Secondary | ICD-10-CM

## 2012-02-17 NOTE — Telephone Encounter (Signed)
Left a message for patient stating that we do not have any Nexium samples right now and I can give her a mail in rebate for the Movi prep and to call me back if she would like this voucher.

## 2012-02-17 NOTE — Progress Notes (Signed)
Subjective:    Patient ID: [name] [name], female    DOB: 05-03-1941, 71 y.o.   MRN: [id number]  HPI Received a call last week from staff at pulmonary rehab: patient had heart rate, with exercise in the 50's. She was asymptomatic. She reports that she has been feeling very tired but denies any arrythmia. Reviewed meds: no beta blockers or other meds to slow the heart rate.   She is due for follow-up of lung nodules - CT chest w/o contrast  PMH, FamHx and SocHx reviewed for any changes and relevance.  Current Outpatient Prescriptions on File Prior to Visit  Medication Sig Dispense Refill  . ALPRAZolam (XANAX) 0.5 MG tablet Take 1 tablet (0.5 mg total) by mouth every 6 (six) hours as needed. For anxiety  60 tablet  5  . amLODipine (NORVASC) 5 MG tablet Take 1 tablet (5 mg total) by mouth daily.  30 tablet  11  . budesonide-formoterol (SYMBICORT) 160-4.5 MCG/ACT inhaler Inhale 2 puffs into the lungs 2 (two) times daily. Rinse mouth  1 Inhaler  11  . cycloSPORINE (RESTASIS) 0.05 % ophthalmic emulsion Place 1 drop into both eyes 2 (two) times daily. BMN  0.4 mL  3  . desloratadine (CLARINEX) 5 MG tablet Take 1 tablet (5 mg total) by mouth every evening. BMN  30 tablet  11  . EPINEPHrine (EPIPEN) 0.3 mg/0.3 mL DEVI Inject 0.3 mLs (0.3 mg total) into the muscle once as needed. Use as directed for severe allergic reaction  1 Device  11  . esomeprazole (NEXIUM) 40 MG capsule Take 1 capsule (40 mg total) by mouth daily before breakfast.  30 capsule  11  . hydrochlorothiazide (HYDRODIURIL) 25 MG tablet Take 1 tablet (25 mg total) by mouth daily.  30 tablet  11  . HYDROcodone-acetaminophen (VICODIN ES) 7.5-750 MG per tablet Take 1 tablet by mouth every 6 (six) hours as needed. For pain  90 tablet  3  . levalbuterol (XOPENEX HFA) 45 MCG/ACT inhaler Inhale 2 puffs into the lungs every 4 (four) hours as needed. Shortness of breath  1 Inhaler  11  . levalbuterol (XOPENEX) 0.63 MG/3ML nebulizer solution Take 3  mLs (0.63 mg total) by nebulization every 6 (six) hours. Shortness of breath  360 mL  0  . olopatadine (PATANOL) 0.1 % ophthalmic solution Place 2 drops into both eyes 2 (two) times daily.        . Olopatadine HCl (PATANASE) 0.6 % SOLN Place 2 drops (2 puffs total) into the nose daily.  30.5 g  5  . peg 3350 powder (MOVIPREP) SOLR Take 1 kit (100 g total) by mouth once.  1 kit  0  . predniSONE (DELTASONE) 5 MG tablet 0.5-2 tabs daily for long term control of asthma or as directed. Takes larger dose for days when having greater asthma  symptoms      . SINGULAIR 10 MG tablet Take 1 tablet (10 mg total) by mouth daily. BRAND NAME ONLY  90 tablet  3  . gabapentin (NEURONTIN) 100 MG capsule Take at bedtime. After 5 days if still having cramps increase to 200 mg at bedtime. IF that doesn't give relief call.  60 capsule  3  . ipratropium (ATROVENT) 0.02 % nebulizer solution Take 2.5 mLs (0.5 mg total) by nebulization every 6 (six) hours.  300 mL  0  . DISCONTD: levalbuterol (XOPENEX HFA) 45 MCG/ACT inhaler Inhale 2 puffs into the lungs every 4 (four) hours as needed for wheezing.  1  Inhaler  2  . DISCONTD: levalbuterol (XOPENEX HFA) 45 MCG/ACT inhaler Inhale 2 puffs into the lungs every 4 (four) hours as needed. Shortness of breath  1 Inhaler  11  . DISCONTD: XOPENEX 0.63 MG/3ML nebulizer solution Take 3 mLs (0.63 mg total) by nebulization every 4 (four) hours as needed for wheezing.  300 mL  12     Review of Systems System review is negative for any constitutional, cardiac, pulmonary, GI or neuro symptoms or complaints other than as described in the HPI.     Objective:   Physical Exam Filed Vitals:   02/17/12 1504  BP: 124/78  Pulse: 66  Temp: 98.3 F (36.8 C)  Resp: 16   gen'l- overweight AA woman in no distress Cor - 2+ radial pulse, sinus bradycardia, no murmur Neuro - A&O x 3       Assessment & Plan:

## 2012-02-17 NOTE — Telephone Encounter (Signed)
Patient states she cannot afford to pay for the prep and get a mail in rebate. Told patient if she cannot afford the prep then I have a Free unit of movi prep that I can call into her pharmacy. Pt states that would be great and I called free unit into [facility].

## 2012-02-17 NOTE — Telephone Encounter (Signed)
Pt was given samples by [name].

## 2012-02-18 ENCOUNTER — Encounter (HOSPITAL_COMMUNITY): Payer: Self-pay

## 2012-02-18 DIAGNOSIS — R001 Bradycardia, unspecified: Secondary | ICD-10-CM

## 2012-02-18 HISTORY — DX: Bradycardia, unspecified: R00.1

## 2012-02-18 NOTE — Assessment & Plan Note (Signed)
Complex patient with persistent bradycardia but asymptomatic.  Plan - due to complexity will request cardiology evaluation.

## 2012-02-18 NOTE — Assessment & Plan Note (Signed)
Patient for follow-up CT chest w/o contrast

## 2012-02-19 ENCOUNTER — Other Ambulatory Visit: Payer: Medicare Other

## 2012-02-20 ENCOUNTER — Encounter (HOSPITAL_COMMUNITY)
Admission: RE | Admit: 2012-02-20 | Discharge: 2012-02-20 | Disposition: A | Discharge status: Home / Self Care | Payer: Self-pay | Source: Ambulatory Visit | Attending: Internal Medicine | Admitting: Internal Medicine

## 2012-02-21 ENCOUNTER — Ambulatory Visit (INDEPENDENT_AMBULATORY_CARE_PROVIDER_SITE_OTHER)
Admission: RE | Admit: 2012-02-21 | Discharge: 2012-02-21 | Disposition: A | Discharge status: Home / Self Care | Payer: Medicare Other | Source: Ambulatory Visit | Attending: Internal Medicine | Admitting: Internal Medicine

## 2012-02-21 DIAGNOSIS — R911 Solitary pulmonary nodule: Secondary | ICD-10-CM

## 2012-02-21 DIAGNOSIS — R918 Other nonspecific abnormal finding of lung field: Secondary | ICD-10-CM | POA: Diagnosis not present

## 2012-02-25 ENCOUNTER — Encounter (HOSPITAL_COMMUNITY)
Admission: RE | Admit: 2012-02-25 | Discharge: 2012-02-25 | Disposition: A | Discharge status: Home / Self Care | Payer: Self-pay | Source: Ambulatory Visit | Attending: Internal Medicine | Admitting: Internal Medicine

## 2012-02-26 ENCOUNTER — Other Ambulatory Visit: Payer: Medicare Other | Admitting: Gastroenterology

## 2012-02-27 ENCOUNTER — Encounter (HOSPITAL_COMMUNITY)
Admission: RE | Admit: 2012-02-27 | Discharge: 2012-02-27 | Disposition: A | Discharge status: Home / Self Care | Payer: Self-pay | Source: Ambulatory Visit | Attending: Internal Medicine | Admitting: Internal Medicine

## 2012-03-03 ENCOUNTER — Encounter (HOSPITAL_COMMUNITY)
Admission: RE | Admit: 2012-03-03 | Discharge: 2012-03-03 | Disposition: A | Discharge status: Home / Self Care | Payer: Self-pay | Source: Ambulatory Visit | Attending: Internal Medicine | Admitting: Internal Medicine

## 2012-03-05 ENCOUNTER — Encounter (HOSPITAL_COMMUNITY)
Admission: RE | Admit: 2012-03-05 | Discharge: 2012-03-05 | Disposition: A | Discharge status: Home / Self Care | Payer: Self-pay | Source: Ambulatory Visit | Attending: Internal Medicine | Admitting: Internal Medicine

## 2012-03-05 ENCOUNTER — Telehealth: Payer: Self-pay | Admitting: *Deleted

## 2012-03-10 ENCOUNTER — Encounter (HOSPITAL_COMMUNITY)
Admission: RE | Admit: 2012-03-10 | Discharge: 2012-03-10 | Disposition: A | Discharge status: Home / Self Care | Payer: Self-pay | Source: Ambulatory Visit | Attending: Internal Medicine | Admitting: Internal Medicine

## 2012-03-11 ENCOUNTER — Telehealth: Payer: Self-pay | Admitting: Internal Medicine

## 2012-03-11 NOTE — Telephone Encounter (Signed)
Patient notified of sample of symbicort 160/4.5 will be at my desk when she comes in on Friday for appt. Fannie Knee

## 2012-03-11 NOTE — Telephone Encounter (Signed)
done

## 2012-03-11 NOTE — Telephone Encounter (Signed)
Pt is requesting symbicort 160 samples--pt ph# [phone]--NW says she will pick them up this Friday.

## 2012-03-12 ENCOUNTER — Encounter (HOSPITAL_COMMUNITY): Payer: Self-pay

## 2012-03-12 ENCOUNTER — Ambulatory Visit: Payer: Medicare Other | Admitting: Cardiovascular Disease

## 2012-03-12 DIAGNOSIS — J4489 Other specified chronic obstructive pulmonary disease: Secondary | ICD-10-CM | POA: Insufficient documentation

## 2012-03-12 DIAGNOSIS — G4730 Sleep apnea, unspecified: Secondary | ICD-10-CM | POA: Insufficient documentation

## 2012-03-12 DIAGNOSIS — J449 Chronic obstructive pulmonary disease, unspecified: Secondary | ICD-10-CM | POA: Insufficient documentation

## 2012-03-12 DIAGNOSIS — Z5189 Encounter for other specified aftercare: Secondary | ICD-10-CM | POA: Insufficient documentation

## 2012-03-12 DIAGNOSIS — K219 Gastro-esophageal reflux disease without esophagitis: Secondary | ICD-10-CM | POA: Insufficient documentation

## 2012-03-12 DIAGNOSIS — M810 Age-related osteoporosis without current pathological fracture: Secondary | ICD-10-CM | POA: Insufficient documentation

## 2012-03-13 ENCOUNTER — Ambulatory Visit: Payer: Medicare Other

## 2012-03-17 ENCOUNTER — Ambulatory Visit (INDEPENDENT_AMBULATORY_CARE_PROVIDER_SITE_OTHER): Payer: Medicare Other

## 2012-03-17 ENCOUNTER — Encounter (HOSPITAL_COMMUNITY)
Admission: RE | Admit: 2012-03-17 | Discharge: 2012-03-17 | Disposition: A | Discharge status: Home / Self Care | Payer: Self-pay | Source: Ambulatory Visit | Attending: Internal Medicine | Admitting: Internal Medicine

## 2012-03-17 DIAGNOSIS — J45909 Unspecified asthma, uncomplicated: Secondary | ICD-10-CM

## 2012-03-17 MED ORDER — OMALIZUMAB 150 MG ~~LOC~~ SOLR
300.0000 mg | Freq: Once | SUBCUTANEOUS | Status: AC
  Administered 2012-03-17: 300 mg via SUBCUTANEOUS

## 2012-03-19 ENCOUNTER — Encounter (HOSPITAL_COMMUNITY)
Admission: RE | Admit: 2012-03-19 | Discharge: 2012-03-19 | Disposition: A | Discharge status: Home / Self Care | Payer: Self-pay | Source: Ambulatory Visit | Attending: Internal Medicine | Admitting: Internal Medicine

## 2012-03-24 ENCOUNTER — Encounter (HOSPITAL_COMMUNITY): Payer: Self-pay

## 2012-03-26 ENCOUNTER — Encounter (HOSPITAL_COMMUNITY)
Admission: RE | Admit: 2012-03-26 | Discharge: 2012-03-26 | Disposition: A | Discharge status: Home / Self Care | Payer: Self-pay | Source: Ambulatory Visit | Attending: Internal Medicine | Admitting: Internal Medicine

## 2012-03-27 ENCOUNTER — Telehealth: Payer: Self-pay

## 2012-03-27 ENCOUNTER — Telehealth: Payer: Self-pay | Admitting: Gastroenterology

## 2012-03-27 MED ORDER — OLOPATADINE HCL 0.1 % OP SOLN: 2.0000 [drp] | Freq: Two times a day (BID) | OPHTHALMIC | Status: DC

## 2012-03-27 NOTE — Telephone Encounter (Signed)
Patient reports abdominal pain that started several days ago.  She says the pain is severe and she is unable to wait for an appt on 04/09/12.  She says she thought that the pain was going to improve and was from something she ate.  She is scheduled for a screening colon on 04/03/12 an wants to be seen in the office prior. The pain is doubling her over at times.    I advised her that it was 4:40 on Friday and that the physicians have gone for the day and that there was nothing I can do for her pain at this time.  She says she will have to go to the ER.  I have encouraged her to reschedule her colonoscopy and schedule an office visit for 04/09/12 to discuss with Dr Russella Dar.  She reports she will call me back on Monday to decide what to do about the colonoscopy scheduled for 04/03/12.  If still having pain next week I will offer her an appt with the extenders

## 2012-03-27 NOTE — Telephone Encounter (Signed)
OK 

## 2012-03-27 NOTE — Telephone Encounter (Signed)
Pt called requesting refill of eye drop for seasonal allergies.

## 2012-03-31 ENCOUNTER — Encounter (HOSPITAL_COMMUNITY)
Admission: RE | Admit: 2012-03-31 | Discharge: 2012-03-31 | Disposition: A | Discharge status: Home / Self Care | Payer: Self-pay | Source: Ambulatory Visit | Attending: Internal Medicine | Admitting: Internal Medicine

## 2012-04-02 ENCOUNTER — Encounter (HOSPITAL_COMMUNITY)
Admission: RE | Admit: 2012-04-02 | Discharge: 2012-04-02 | Disposition: A | Discharge status: Home / Self Care | Payer: Self-pay | Source: Ambulatory Visit | Attending: Internal Medicine | Admitting: Internal Medicine

## 2012-04-03 ENCOUNTER — Other Ambulatory Visit: Payer: Medicare Other | Admitting: Gastroenterology

## 2012-04-07 ENCOUNTER — Encounter (HOSPITAL_COMMUNITY): Admission: RE | Admit: 2012-04-07 | Payer: Self-pay | Source: Ambulatory Visit

## 2012-04-08 ENCOUNTER — Telehealth: Payer: Self-pay | Admitting: Internal Medicine

## 2012-04-08 NOTE — Telephone Encounter (Signed)
Pt calling today 04/08/12 regarding she has appt at Cardiologist (Dr. Excell Seltzer) 04/10/12.  She is calling requesting to pick up a copy of her medical record along with a copy of all her medications that she can take with her to appt.  Would like to pick those up tomorrow 04/09/12.  She will be near office for another appt around 12 noon tomorrow, so would like to come by then to pick up medical records.  PLEASE CALL PT BACK AT [phone] TO LET HER KNOW IF MEDICAL RECORDS WILL BE READY FOR HER AT THAT TIME.

## 2012-04-09 ENCOUNTER — Ambulatory Visit (INDEPENDENT_AMBULATORY_CARE_PROVIDER_SITE_OTHER): Payer: Medicare Other | Admitting: Internal Medicine

## 2012-04-09 ENCOUNTER — Encounter: Payer: Self-pay | Admitting: Internal Medicine

## 2012-04-09 ENCOUNTER — Encounter (HOSPITAL_COMMUNITY): Payer: Self-pay

## 2012-04-09 ENCOUNTER — Ambulatory Visit: Payer: Medicare Other | Admitting: Internal Medicine

## 2012-04-09 VITALS — BP 140/80 | HR 59 | Temp 97.8°F | Ht 64.5 in | Wt 196.8 lb

## 2012-04-09 DIAGNOSIS — J45909 Unspecified asthma, uncomplicated: Secondary | ICD-10-CM | POA: Diagnosis not present

## 2012-04-09 DIAGNOSIS — R911 Solitary pulmonary nodule: Secondary | ICD-10-CM

## 2012-04-09 DIAGNOSIS — J4550 Severe persistent asthma, uncomplicated: Secondary | ICD-10-CM

## 2012-04-09 DIAGNOSIS — E669 Obesity, unspecified: Secondary | ICD-10-CM | POA: Diagnosis not present

## 2012-04-09 DIAGNOSIS — E66811 Obesity, class 1: Secondary | ICD-10-CM

## 2012-04-09 NOTE — Progress Notes (Signed)
Subjective:    Patient ID: [name] [name], female    DOB: 02/11/1941, 71 y.o.   MRN: [id number]  HPI 1. ASTHMA -  Baseline Chronic High Risk (multiple admits), Allergic, Moderate-Severe Persistent Asthma   - Allergies, gerd, sinus exacerbators.   -  Frequent prednisone -> daily low dose prednisone since FEb 20112 -  PFT 11/06/2010 - Mixed obstruction - restriction - fev1 1L/50%, Ratio 54, 24% BD response, TLC 3.6/78%, DLCO 16/75% - May 2012: Switched asthma care to Dr. Marchelle Gearing - June 2012 PFT - showed normalization with prednisone:  PFts 04/18/2011 shows NORMALIZATION (fev1 1.8L/93%, ratio 68, 9% BD response, DLCO 86).  - August 2012: Fev1: 1.1lL/58% -> prednisone burst-> 1.09L/60%, Ratio 65 (no  - Nov 2012: Fev1 0.97L/51%, Ratio 60 -> pred burst - Dec 2012: Fev1 0.7L/37%, Ratio 67% - Jan 2013: Full PFTs fev1 1.97L/103%, Ratio 75, DLC 74%, - BEST EVER (1 day post short pred course)    2. Bronchiectasis, (mild RUL) seen on CT chest - April 2012 ( ABPA wu tests on prednisone in June 2012: asp antibody IgE asp fumigatus 0.24 ku/L which is borderline/equivocal, IgG for all all aspergiluus preciptins - NEGATIVE. 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  Allergic rhinitis and allergic asthma.   - 2000s: - per hx strongly skin test positive at Dr Lucie Leather office  - 2004 - trialed xolair - per hx effective but developed rash which she subjectively attributed to twice daily dosing (thogh chart review 2004-2005 states it was stopped due to subjective lack of improvement and an incidental rash resolution was noted in retrospect after stopping xolair)  - 2009: IgE 306 in July 2009. RAST positive for various grass, oak, elm, ragweed, plantain, lamb etc., s/p Allergy shot trial with Dr Maple Hudson - stopped due to transport issues - 2012: May - IgE 588.  - 2012: June - moved  allergy MD to Dr Aris Georgia - 2013 Spring: Started xolair  4. Lung  nodule, 6 mm, on CT scan. - April 2012. Possibly calcified. RUL (no prior for comparison but a 07/17/2000 CT chest reports simillar size nodule in similar location). Repeat CT  April 2013 - no change  5.  Gastroesophageal reflux disease with hx of UGI scope showing Hiatal hernia.     6. History of sleep apnea.- non compliant with CPAP   7. Significant anxiety (also mood lability hx with prednisone, worsening anxiety with albuterol)  8. Recurrent AE-ASthma -  Admitted 4/23-4/30/12, 12/10-12/17/12 - OPD AE asthma Rx:  July 2012, august 2012, Nov 2012, Jan 2013    OV 12/10/2011 Followup asthma. Saw NP 12/03/11 with mild AE-asthma. Took prednisone and antibiotic; completed yesterday. Feels reall well and back to normal. Had full PFT today and this is normal. This is first time we are seeing normal PFT and normalization of what we have considered as severe persistent asthma even at baseline lung function. We noticed this during hospitalization in dec 2012 as well. She insists she is compliant with symbicort 160 and singulair and prednisone 5mg  daily. She insists mdi technique is good. She is not interested in trying spiriva at this point ('helped but blisters' ) in mouth. She is not interested in upping ics to dulera high dose. She has not yet started xolair but is ready to do it today. No interim complaints.  Her rehab program is ending but she wants to continue  Past, Family, Social reviewed:  no change since last visit eccept as above  Continue your asthma medications as before  [name] will test your coordination with symbicort  Please do not miss any doses  WE will check if you can start your xolair today; ok from my standpoint  Return in 2-3 months or sooner if needed  OV 04/09/2012 Followup severe persistent asthma, pulmonary nodule and new issue of wanting to lose weight (Body mass index is 33.26 kg/(m^2).) due to obesity    Doing monthly xolair - s/p  4 months. Overall this has helped  asthma. No ae episodes. Albuterol use last 2 mnths ago. But overall feeels fatigued. Feels daily prednisone is tough on her. Also, last 2 days mild increase in  Nocturnal cough. She wants samples of symbicort. We discussed and she is interested in knowing more about thermoplasty.   Past, Family, Social reviewed: bradycardia at rehab per hx; due to see Dr Excell Seltzer of cards tomorrow. Interested in weight loss    Current outpatient prescriptions:ALPRAZolam (XANAX) 0.5 MG tablet, Take 1 tablet (0.5 mg total) by mouth every 6 (six) hours as needed. For anxiety, Disp: 60 tablet, Rfl: 5;  amLODipine (NORVASC) 5 MG tablet, Take 1 tablet (5 mg total) by mouth daily., Disp: 30 tablet, Rfl: 11;  budesonide-formoterol (SYMBICORT) 160-4.5 MCG/ACT inhaler, Inhale 2 puffs into the lungs 2 (two) times daily. Rinse mouth, Disp: 1 Inhaler, Rfl: 11 cycloSPORINE (RESTASIS) 0.05 % ophthalmic emulsion, Place 1 drop into both eyes 2 (two) times daily. BMN, Disp: 0.4 mL, Rfl: 3;  desloratadine (CLARINEX) 5 MG tablet, Take 1 tablet (5 mg total) by mouth every evening. BMN, Disp: 30 tablet, Rfl: 11;  EPINEPHrine (EPIPEN) 0.3 mg/0.3 mL DEVI, Inject 0.3 mLs (0.3 mg total) into the muscle once as needed. Use as directed for severe allergic reaction, Disp: 1 Device, Rfl: 11 esomeprazole (NEXIUM) 40 MG capsule, Take 1 capsule (40 mg total) by mouth daily before breakfast., Disp: 30 capsule, Rfl: 11;  hydrochlorothiazide (HYDRODIURIL) 25 MG tablet, Take 1 tablet (25 mg total) by mouth daily., Disp: 30 tablet, Rfl: 11;  HYDROcodone-acetaminophen (VICODIN ES) 7.5-750 MG per tablet, Take 1 tablet by mouth every 6 (six) hours as needed. For pain, Disp: 90 tablet, Rfl: 3 ipratropium (ATROVENT) 0.02 % nebulizer solution, Take 2.5 mLs (0.5 mg total) by nebulization every 6 (six) hours., Disp: 300 mL, Rfl: 0;  levalbuterol (XOPENEX HFA) 45 MCG/ACT inhaler, Inhale 2 puffs into the lungs every 4 (four) hours as needed. Shortness of breath, Disp: 1  Inhaler, Rfl: 11 levalbuterol (XOPENEX) 0.63 MG/3ML nebulizer solution, Take 3 mLs (0.63 mg total) by nebulization every 6 (six) hours. Shortness of breath, Disp: 360 mL, Rfl: 0;  olopatadine (PATANOL) 0.1 % ophthalmic solution, Place 2 drops into both eyes 2 (two) times daily., Disp: 5 mL, Rfl: 2;  Olopatadine HCl (PATANASE) 0.6 % SOLN, Place 2 drops (2 puffs total) into the nose daily., Disp: 30.5 g, Rfl: 5 peg 3350 powder (MOVIPREP) SOLR, Take 1 kit (100 g total) by mouth once., Disp: 1 kit, Rfl: 0;  predniSONE (DELTASONE) 5 MG tablet, 0.5-2 tabs daily for long term control of asthma or as directed. Takes larger dose for days when having greater asthma  symptoms, Disp: , Rfl: ;  SINGULAIR 10 MG tablet, Take 1 tablet (10 mg total) by mouth daily. BRAND NAME ONLY, Disp: 90 tablet, Rfl: 3 DISCONTD: levalbuterol (XOPENEX HFA) 45 MCG/ACT inhaler, Inhale 2 puffs into the lungs every 4 (four) hours as needed for wheezing., Disp: 1  Inhaler, Rfl: 2;  DISCONTD: levalbuterol (XOPENEX HFA) 45 MCG/ACT inhaler, Inhale 2 puffs into the lungs every 4 (four) hours as needed. Shortness of breath, Disp: 1 Inhaler, Rfl: 11 DISCONTD: XOPENEX 0.63 MG/3ML nebulizer solution, Take 3 mLs (0.63 mg total) by nebulization every 4 (four) hours as needed for wheezing., Disp: 300 mL, Rfl: 12  Review of Systems  Constitutional: Negative for fever and unexpected weight change.  HENT: Negative for ear pain, nosebleeds, congestion, sore throat, rhinorrhea, sneezing, trouble swallowing, dental problem, postnasal drip and sinus pressure.   Eyes: Negative for redness and itching.  Respiratory: Negative for cough, chest tightness, shortness of breath and wheezing.   Cardiovascular: Negative for palpitations and leg swelling.  Gastrointestinal: Negative for nausea and vomiting.  Genitourinary: Negative for dysuria.  Musculoskeletal: Negative for joint swelling.  Skin: Negative for rash.  Neurological: Negative for headaches.    Hematological: Does not bruise/bleed easily.  Psychiatric/Behavioral: Negative for dysphoric mood. The patient is not nervous/anxious.        Objective:   Physical Exam GEN: A/Ox3; pleasant , NAD   HEENT:  [place]/AT,  EACs-clear, TMs-wnl, NOSE-clear, THROAT-clear, no lesions, no postnasal drip or exudate noted.   NECK:  Supple w/ fair ROM; no JVD; normal carotid impulses w/o bruits; no thyromegaly or nodules palpated; no lymphadenopathy.  RESP  Coarse BS no accessory muscle use, no dullness to percussion, no wheezing  CARD:  RRR, no m/r/g  , no peripheral edema, pulses intact, no cyanosis or clubbing.  GI:   Soft & nt; nml bowel sounds; no organomegaly or masses detected.  Musco: Warm bil, no deformities or joint swelling noted.   Neuro: alert, no focal deficits noted.    Skin: Warm, no lesions or rashes         Assessment & Plan:

## 2012-04-09 NOTE — Patient Instructions (Addendum)
#  ASthma Continue your asthma medications as before including daily prednisone - do not miss any doses  Continue xolair If your cough of last 2 days gets worse or develop other symptoms, please call us [phone] anytime so we could consider prednisone burst We will refer you to [facility] Dr Carmie Kanner for consideration of thermoplasty  And evaluation and opinion  # WEight management  - follow [facility] diet sheet as advised  #Pulmonary nodule  - stable as of April 2013 and c/w benign but she watns anotther CT; will do April 2014  #Followup   - 3months or sooner if needed  - spirometry at followup

## 2012-04-10 ENCOUNTER — Ambulatory Visit (INDEPENDENT_AMBULATORY_CARE_PROVIDER_SITE_OTHER): Payer: Medicare Other | Admitting: Cardiovascular Disease

## 2012-04-10 ENCOUNTER — Encounter: Payer: Self-pay | Admitting: Cardiovascular Disease

## 2012-04-10 VITALS — BP 126/72 | HR 68 | Ht 64.0 in | Wt 195.4 lb

## 2012-04-10 DIAGNOSIS — R001 Bradycardia, unspecified: Secondary | ICD-10-CM

## 2012-04-10 DIAGNOSIS — I498 Other specified cardiac arrhythmias: Secondary | ICD-10-CM
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
## 2012-04-10 NOTE — Patient Instructions (Signed)
Your physician recommends that you continue on your current medications as directed. Please refer to the Current Medication list given to you today.  Your physician recommends that you schedule a follow-up appointment as needed.   Your physician has recommended that you wear a 24 hour holter monitor (this should be worn while the pt is at pulmonary rehab). Holter monitors are medical devices that record the heart's electrical activity. Doctors most often use these monitors to diagnose arrhythmias. Arrhythmias are problems with the speed or rhythm of the heartbeat. The monitor is a small, portable device. You can wear one while you do your normal daily activities. This is usually used to diagnose what is causing palpitations/syncope (passing out).

## 2012-04-10 NOTE — Telephone Encounter (Signed)
EMR-shared record. Dr. Excell Seltzer has full access to all the information in her record

## 2012-04-12 ENCOUNTER — Encounter: Payer: Self-pay | Admitting: Internal Medicine

## 2012-04-12 DIAGNOSIS — E669 Obesity, unspecified: Secondary | ICD-10-CM | POA: Insufficient documentation

## 2012-04-12 NOTE — Assessment & Plan Note (Signed)
#  ASthma Continue your asthma medications as before including daily prednisone - do not miss any doses  Continue xolair If your cough of last 2 days gets worse or develop other symptoms, please call us [phone] anytime so we could consider prednisone burst We will refer you to [facility] Dr Carmie Kanner for consideration of thermoplasty  And evaluation and opinion

## 2012-04-12 NOTE — Assessment & Plan Note (Signed)
Lung nodule, 6 mm, on CT scan. - April 2012. Possibly calcified. RUL (no prior for comparison but a 07/17/2000 CT chest reports simillar size nodule in similar location). repeat CT chest April 2013 - no change  Plan Advised no further ct but she wants repeat in April 2014 so will do one

## 2012-04-12 NOTE — Assessment & Plan Note (Signed)
Daily prednisone making it difficult to lose weight. I have advised low glycemic diet based on [facility] model giving spectrum of foods

## 2012-04-13 ENCOUNTER — Ambulatory Visit (INDEPENDENT_AMBULATORY_CARE_PROVIDER_SITE_OTHER): Payer: Medicare Other

## 2012-04-13 DIAGNOSIS — J45909 Unspecified asthma, uncomplicated: Secondary | ICD-10-CM

## 2012-04-13 MED ORDER — OMALIZUMAB 150 MG ~~LOC~~ SOLR
300.0000 mg | Freq: Once | SUBCUTANEOUS | Status: AC
  Administered 2012-04-13: 300 mg via SUBCUTANEOUS

## 2012-04-14 ENCOUNTER — Encounter (HOSPITAL_COMMUNITY): Admission: RE | Admit: 2012-04-14 | Payer: Self-pay | Source: Ambulatory Visit

## 2012-04-15 ENCOUNTER — Encounter (INDEPENDENT_AMBULATORY_CARE_PROVIDER_SITE_OTHER): Payer: Medicare Other

## 2012-04-15 DIAGNOSIS — R001 Bradycardia, unspecified: Secondary | ICD-10-CM

## 2012-04-15 DIAGNOSIS — I498 Other specified cardiac arrhythmias: Secondary | ICD-10-CM | POA: Diagnosis not present

## 2012-04-16 ENCOUNTER — Encounter (HOSPITAL_COMMUNITY)
Admission: RE | Admit: 2012-04-16 | Discharge: 2012-04-16 | Disposition: A | Discharge status: Home / Self Care | Payer: Self-pay | Source: Ambulatory Visit | Attending: Internal Medicine | Admitting: Internal Medicine

## 2012-04-16 ENCOUNTER — Other Ambulatory Visit: Payer: Self-pay | Admitting: Adult Health

## 2012-04-16 DIAGNOSIS — K219 Gastro-esophageal reflux disease without esophagitis: Secondary | ICD-10-CM | POA: Insufficient documentation

## 2012-04-16 DIAGNOSIS — G4730 Sleep apnea, unspecified: Secondary | ICD-10-CM | POA: Insufficient documentation

## 2012-04-16 DIAGNOSIS — J4489 Other specified chronic obstructive pulmonary disease: Secondary | ICD-10-CM | POA: Insufficient documentation

## 2012-04-16 DIAGNOSIS — M810 Age-related osteoporosis without current pathological fracture: Secondary | ICD-10-CM | POA: Insufficient documentation

## 2012-04-16 DIAGNOSIS — J449 Chronic obstructive pulmonary disease, unspecified: Secondary | ICD-10-CM | POA: Insufficient documentation

## 2012-04-16 DIAGNOSIS — Z5189 Encounter for other specified aftercare: Secondary | ICD-10-CM | POA: Insufficient documentation

## 2012-04-21 ENCOUNTER — Encounter (HOSPITAL_COMMUNITY)
Admission: RE | Admit: 2012-04-21 | Discharge: 2012-04-21 | Disposition: A | Discharge status: Home / Self Care | Payer: Self-pay | Source: Ambulatory Visit | Attending: Internal Medicine | Admitting: Internal Medicine

## 2012-04-23 ENCOUNTER — Encounter (HOSPITAL_COMMUNITY)
Admission: RE | Admit: 2012-04-23 | Discharge: 2012-04-23 | Disposition: A | Discharge status: Home / Self Care | Payer: Self-pay | Source: Ambulatory Visit | Attending: Internal Medicine | Admitting: Internal Medicine

## 2012-04-28 ENCOUNTER — Encounter (HOSPITAL_COMMUNITY)
Admission: RE | Admit: 2012-04-28 | Discharge: 2012-04-28 | Disposition: A | Discharge status: Home / Self Care | Payer: Self-pay | Source: Ambulatory Visit | Attending: Internal Medicine | Admitting: Internal Medicine

## 2012-04-30 ENCOUNTER — Encounter (HOSPITAL_COMMUNITY)
Admission: RE | Admit: 2012-04-30 | Discharge: 2012-04-30 | Disposition: A | Discharge status: Home / Self Care | Payer: Self-pay | Source: Ambulatory Visit | Attending: Internal Medicine | Admitting: Internal Medicine

## 2012-05-05 ENCOUNTER — Encounter (HOSPITAL_COMMUNITY): Payer: Self-pay

## 2012-05-05 DIAGNOSIS — J45909 Unspecified asthma, uncomplicated: Secondary | ICD-10-CM | POA: Diagnosis not present

## 2012-05-07 ENCOUNTER — Encounter (HOSPITAL_COMMUNITY): Admission: RE | Admit: 2012-05-07 | Payer: Self-pay | Source: Ambulatory Visit

## 2012-05-12 ENCOUNTER — Encounter (HOSPITAL_COMMUNITY)
Admission: RE | Admit: 2012-05-12 | Discharge: 2012-05-12 | Disposition: A | Discharge status: Home / Self Care | Payer: Self-pay | Source: Ambulatory Visit | Attending: Internal Medicine | Admitting: Internal Medicine

## 2012-05-12 DIAGNOSIS — Z5189 Encounter for other specified aftercare: Secondary | ICD-10-CM | POA: Insufficient documentation

## 2012-05-12 DIAGNOSIS — K219 Gastro-esophageal reflux disease without esophagitis: Secondary | ICD-10-CM | POA: Insufficient documentation

## 2012-05-12 DIAGNOSIS — G4730 Sleep apnea, unspecified: Secondary | ICD-10-CM | POA: Insufficient documentation

## 2012-05-12 DIAGNOSIS — J449 Chronic obstructive pulmonary disease, unspecified: Secondary | ICD-10-CM | POA: Insufficient documentation

## 2012-05-12 DIAGNOSIS — J4489 Other specified chronic obstructive pulmonary disease: Secondary | ICD-10-CM | POA: Insufficient documentation

## 2012-05-12 DIAGNOSIS — M810 Age-related osteoporosis without current pathological fracture: Secondary | ICD-10-CM | POA: Insufficient documentation

## 2012-05-13 ENCOUNTER — Ambulatory Visit (INDEPENDENT_AMBULATORY_CARE_PROVIDER_SITE_OTHER): Payer: Medicare Other

## 2012-05-13 DIAGNOSIS — J45909 Unspecified asthma, uncomplicated: Secondary | ICD-10-CM | POA: Diagnosis not present

## 2012-05-14 ENCOUNTER — Encounter (HOSPITAL_COMMUNITY): Payer: Self-pay

## 2012-05-15 MED ORDER — OMALIZUMAB 150 MG ~~LOC~~ SOLR
300.0000 mg | Freq: Once | SUBCUTANEOUS | Status: AC
  Administered 2012-05-15: 300 mg via SUBCUTANEOUS

## 2012-05-19 ENCOUNTER — Encounter (HOSPITAL_COMMUNITY)
Admission: RE | Admit: 2012-05-19 | Discharge: 2012-05-19 | Disposition: A | Discharge status: Home / Self Care | Payer: Self-pay | Source: Ambulatory Visit | Attending: Internal Medicine | Admitting: Internal Medicine

## 2012-05-19 ENCOUNTER — Telehealth: Payer: Self-pay | Admitting: Internal Medicine

## 2012-05-19 NOTE — Telephone Encounter (Signed)
Spoke with patient states she has a productive cough,wheezing, sob made an  appt with Dr Craige Cotta on  05-20-12 Advised pt that if symptoms got worse to go to ED. Pt aware and nothing further was needed.

## 2012-05-20 ENCOUNTER — Encounter: Payer: Self-pay | Admitting: Pulmonary Disease

## 2012-05-20 ENCOUNTER — Ambulatory Visit (INDEPENDENT_AMBULATORY_CARE_PROVIDER_SITE_OTHER): Payer: Medicare Other | Admitting: Pulmonary Disease

## 2012-05-20 VITALS — BP 130/80 | HR 77 | Temp 98.0°F | Ht 63.5 in | Wt 195.0 lb

## 2012-05-20 DIAGNOSIS — J309 Allergic rhinitis, unspecified: Secondary | ICD-10-CM | POA: Diagnosis not present

## 2012-05-20 DIAGNOSIS — J45909 Unspecified asthma, uncomplicated: Secondary | ICD-10-CM | POA: Diagnosis not present

## 2012-05-20 DIAGNOSIS — J4550 Severe persistent asthma, uncomplicated: Secondary | ICD-10-CM

## 2012-05-20 MED ORDER — PREDNISONE 10 MG PO TABS: ORAL_TABLET | ORAL | Status: DC

## 2012-05-20 MED ORDER — AZITHROMYCIN 250 MG PO TABS: ORAL_TABLET | ORAL | Status: AC
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
## 2012-05-20 NOTE — Assessment & Plan Note (Signed)
She has acute asthma exacerbation likely triggered by sinus infection.  Will give course of prednisone, and taper back to her usual dose of 5 mg per day.  She is to continue her other regimen.

## 2012-05-20 NOTE — Assessment & Plan Note (Signed)
She has acute sinusitis contributing to asthma exacerbation.  She has responded well to Zpak before.  Will give a course of this.

## 2012-05-20 NOTE — Patient Instructions (Signed)
Zithromax 250 mg pill>>2 pills on first day, then 1 pill daily for next four days Prednisone 10 mg pill>>4 pills for 2 days, 3 pills for 2 days, 2 pills for 2 days, 1 pill for 2 days, then resume prednisone 5 mg per day Call if no better after few days Call next week to update status and get letter to resume pulmonary rehab Follow up in August 2013 with Dr. Marchelle Gearing

## 2012-05-21 ENCOUNTER — Encounter (HOSPITAL_COMMUNITY): Payer: Self-pay

## 2012-05-22 DIAGNOSIS — Z79899 Other long term (current) drug therapy: Secondary | ICD-10-CM | POA: Diagnosis not present

## 2012-05-22 DIAGNOSIS — G589 Mononeuropathy, unspecified: Secondary | ICD-10-CM | POA: Diagnosis not present

## 2012-05-22 DIAGNOSIS — M79609 Pain in unspecified limb: Secondary | ICD-10-CM | POA: Diagnosis not present

## 2012-05-22 DIAGNOSIS — B351 Tinea unguium: Secondary | ICD-10-CM | POA: Diagnosis not present

## 2012-05-26 ENCOUNTER — Encounter (HOSPITAL_COMMUNITY): Admission: RE | Admit: 2012-05-26 | Payer: Self-pay | Source: Ambulatory Visit

## 2012-05-28 ENCOUNTER — Encounter (HOSPITAL_COMMUNITY): Payer: Self-pay

## 2012-06-02 ENCOUNTER — Encounter (HOSPITAL_COMMUNITY)
Admission: RE | Admit: 2012-06-02 | Discharge: 2012-06-02 | Disposition: A | Discharge status: Home / Self Care | Payer: Self-pay | Source: Ambulatory Visit | Attending: Internal Medicine | Admitting: Internal Medicine

## 2012-06-04 ENCOUNTER — Encounter (HOSPITAL_COMMUNITY)
Admission: RE | Admit: 2012-06-04 | Discharge: 2012-06-04 | Disposition: A | Discharge status: Home / Self Care | Payer: Self-pay | Source: Ambulatory Visit | Attending: Internal Medicine | Admitting: Internal Medicine

## 2012-06-09 ENCOUNTER — Encounter: Payer: Self-pay | Admitting: Internal Medicine

## 2012-06-09 ENCOUNTER — Telehealth: Payer: Self-pay | Admitting: Internal Medicine

## 2012-06-09 ENCOUNTER — Encounter (HOSPITAL_COMMUNITY)
Admission: RE | Admit: 2012-06-09 | Discharge: 2012-06-09 | Disposition: A | Discharge status: Home / Self Care | Payer: Self-pay | Source: Ambulatory Visit | Attending: Internal Medicine | Admitting: Internal Medicine

## 2012-06-09 ENCOUNTER — Ambulatory Visit (INDEPENDENT_AMBULATORY_CARE_PROVIDER_SITE_OTHER): Payer: Medicare Other | Admitting: Internal Medicine

## 2012-06-09 VITALS — BP 126/70 | HR 66 | Temp 98.1°F | Resp 18 | Wt 194.0 lb

## 2012-06-09 DIAGNOSIS — M79609 Pain in unspecified limb: Secondary | ICD-10-CM

## 2012-06-09 DIAGNOSIS — M79604 Pain in right leg: Secondary | ICD-10-CM

## 2012-06-09 MED ORDER — HYDROCODONE-ACETAMINOPHEN 7.5-750 MG PO TABS: 1.0000 | ORAL_TABLET | Freq: Four times a day (QID) | ORAL | Status: DC | PRN

## 2012-06-09 NOTE — Progress Notes (Signed)
Subjective:    Patient ID: [name] [name], female    DOB: 10/23/1941, 71 y.o.   MRN: [id number]  HPI Ms. Shelden reports she missed a step and fell last Thursday. She landed on the floor on her right side. She has continued pain in the right lateral leg from upper thigh to mid-calve. No residual bruising. She had foot drop right for two days but this resolved spontaneously. She also had swelling off the right leg. .  PMH, FamHx and SocHx reviewed for any changes and relevance. Current Outpatient Prescriptions on File Prior to Visit  Medication Sig Dispense Refill  . ALPRAZolam (XANAX) 0.5 MG tablet Take 1 tablet (0.5 mg total) by mouth every 6 (six) hours as needed. For anxiety  60 tablet  5  . amLODipine (NORVASC) 5 MG tablet Take 1 tablet (5 mg total) by mouth daily.  30 tablet  11  . budesonide-formoterol (SYMBICORT) 160-4.5 MCG/ACT inhaler Inhale 2 puffs into the lungs 2 (two) times daily. Rinse mouth  1 Inhaler  11  . cycloSPORINE (RESTASIS) 0.05 % ophthalmic emulsion Place 1 drop into both eyes 2 (two) times daily. BMN  0.4 mL  3  . desloratadine (CLARINEX) 5 MG tablet Take 1 tablet (5 mg total) by mouth every evening. BMN  30 tablet  11  . EPINEPHrine (EPIPEN) 0.3 mg/0.3 mL DEVI Inject 0.3 mLs (0.3 mg total) into the muscle once as needed. Use as directed for severe allergic reaction  1 Device  11  . esomeprazole (NEXIUM) 40 MG capsule Take 1 capsule (40 mg total) by mouth daily before breakfast.  30 capsule  11  . hydrochlorothiazide (HYDRODIURIL) 25 MG tablet Take 1 tablet (25 mg total) by mouth daily.  30 tablet  11  . HYDROcodone-acetaminophen (VICODIN ES) 7.5-750 MG per tablet Take 1 tablet by mouth every 6 (six) hours as needed. For pain  90 tablet  3  . levalbuterol (XOPENEX HFA) 45 MCG/ACT inhaler Inhale 2 puffs into the lungs every 4 (four) hours as needed. Shortness of breath  1 Inhaler  11  . levalbuterol (XOPENEX) 0.63 MG/3ML nebulizer solution Take 3 mLs (0.63 mg total) by  nebulization every 6 (six) hours. Shortness of breath  360 mL  0  . olopatadine (PATANOL) 0.1 % ophthalmic solution Place 2 drops into both eyes 2 (two) times daily.  5 mL  2  . Olopatadine HCl (PATANASE) 0.6 % SOLN Place 2 drops (2 puffs total) into the nose daily.  30.5 g  5  . predniSONE (DELTASONE) 10 MG tablet Take as directed (2.5-5mg  daily)      . predniSONE (DELTASONE) 5 MG tablet 0.5-2 tabs daily for long term control of asthma or as directed. Takes larger dose for days when having greater asthma  symptoms      . SINGULAIR 10 MG tablet Take 1 tablet (10 mg total) by mouth daily. BRAND NAME ONLY  90 tablet  3  . peg 3350 powder (MOVIPREP) SOLR Take 1 kit (100 g total) by mouth once.  1 kit  0  . DISCONTD: levalbuterol (XOPENEX HFA) 45 MCG/ACT inhaler Inhale 2 puffs into the lungs every 4 (four) hours as needed. Shortness of breath  1 Inhaler  11     Review of Systems System review is negative for any constitutional, cardiac, pulmonary, GI or neuro symptoms or complaints other than as described in the HPI.     Objective:   Physical Exam Filed Vitals:   06/09/12 1517  BP:  126/70  Pulse: 66  Temp: 98.1 F (36.7 C)  Resp: 18   Gen'l- overweight AA woman with chronic cough and wheezing Cor- 2+ radial pulse, RRR Pulm - mild wheezing, wet cough, SOB Back exam: normal stand; normal flex to greater than 100 degrees; normal gait; normal toe/heel walk; normal step up to exam table; normal SLR sitting; normal DTRs at the patellar tendons; normal sensation to light touch, pin-prick and deep vibratory stimulus; no  CVA tenderness; able to move supine to sitting witout assistance. Ext - no deformity, normal ROM hip, knee. Normal gait.       Assessment & Plan:  Leg pain - no radiculopathy on exam. Normal exam right LE. Resolving bruising and contusion after fall  Plan  APAP or low dose otc NSAID  Reassurance: no evidence of back injury or joint damage.

## 2012-06-09 NOTE — Telephone Encounter (Signed)
symbicort left at front for the pt. No nexium available.Carron Curie, CMA

## 2012-06-11 ENCOUNTER — Encounter (HOSPITAL_COMMUNITY)
Admission: RE | Admit: 2012-06-11 | Discharge: 2012-06-11 | Disposition: A | Discharge status: Home / Self Care | Payer: Self-pay | Source: Ambulatory Visit | Attending: Internal Medicine | Admitting: Internal Medicine

## 2012-06-11 DIAGNOSIS — K219 Gastro-esophageal reflux disease without esophagitis: Secondary | ICD-10-CM | POA: Insufficient documentation

## 2012-06-11 DIAGNOSIS — G4730 Sleep apnea, unspecified: Secondary | ICD-10-CM | POA: Insufficient documentation

## 2012-06-11 DIAGNOSIS — J4489 Other specified chronic obstructive pulmonary disease: Secondary | ICD-10-CM | POA: Insufficient documentation

## 2012-06-11 DIAGNOSIS — Z5189 Encounter for other specified aftercare: Secondary | ICD-10-CM | POA: Insufficient documentation

## 2012-06-11 DIAGNOSIS — J449 Chronic obstructive pulmonary disease, unspecified: Secondary | ICD-10-CM | POA: Insufficient documentation

## 2012-06-11 DIAGNOSIS — M810 Age-related osteoporosis without current pathological fracture: Secondary | ICD-10-CM | POA: Insufficient documentation

## 2012-06-15 ENCOUNTER — Encounter: Payer: Self-pay | Admitting: Internal Medicine

## 2012-06-15 ENCOUNTER — Ambulatory Visit (INDEPENDENT_AMBULATORY_CARE_PROVIDER_SITE_OTHER): Payer: Medicare Other

## 2012-06-15 ENCOUNTER — Ambulatory Visit (INDEPENDENT_AMBULATORY_CARE_PROVIDER_SITE_OTHER): Payer: Medicare Other | Admitting: Internal Medicine

## 2012-06-15 VITALS — BP 122/82 | HR 62 | Temp 98.0°F | Ht 63.5 in | Wt 193.0 lb

## 2012-06-15 DIAGNOSIS — Z5181 Encounter for therapeutic drug level monitoring: Secondary | ICD-10-CM | POA: Diagnosis not present

## 2012-06-15 DIAGNOSIS — R7309 Other abnormal glucose: Secondary | ICD-10-CM

## 2012-06-15 DIAGNOSIS — E669 Obesity, unspecified: Secondary | ICD-10-CM

## 2012-06-15 DIAGNOSIS — Z833 Family history of diabetes mellitus: Secondary | ICD-10-CM | POA: Diagnosis not present

## 2012-06-15 DIAGNOSIS — R5383 Other fatigue: Secondary | ICD-10-CM

## 2012-06-15 DIAGNOSIS — E785 Hyperlipidemia, unspecified: Secondary | ICD-10-CM

## 2012-06-15 DIAGNOSIS — J4550 Severe persistent asthma, uncomplicated: Secondary | ICD-10-CM

## 2012-06-15 DIAGNOSIS — J45909 Unspecified asthma, uncomplicated: Secondary | ICD-10-CM

## 2012-06-15 DIAGNOSIS — G4730 Sleep apnea, unspecified: Secondary | ICD-10-CM

## 2012-06-15 DIAGNOSIS — R7303 Prediabetes: Secondary | ICD-10-CM

## 2012-06-15 DIAGNOSIS — E8881 Metabolic syndrome: Secondary | ICD-10-CM

## 2012-06-15 NOTE — Patient Instructions (Addendum)
#  Asthma  - stable today  - continue medications including xolair as scheduled  - follow with Dr Carmie Kanner  - regroup in 3 months  #Sleep apnea  - refer to Dr Craige Cotta  #General health  - check ha1c blood test today - 3 months average sugar  #Followu  3 months with spirometry test

## 2012-06-15 NOTE — Progress Notes (Signed)
  Subjective:    Patient ID: [name] [name], female    DOB: [birth year], 71 y.o.   MRN: [id number]  HPI [name] [name] is a 71 y.o. female with severe asthma, bronchiectasis, allergic rhinitis.  She is followed by Dr. Marchelle Gearing.  She is on chronic prednisone and xolair.  She is normally on 2 to 5 mg prednisone per day.  She had to increase this to 10 mg per day since July 4.  She has increased cough and yellow sputum.  She has more sinus pressure, and post nasal drip.  She had temperature up to 100F 2 days ago.  She has ear pain, sore throat, and increased wheeze.  Her chest gets sore from coughing.  She is now using her nebulizer 4 to 5 times per day.    She was seen by Dr. Carmie Kanner for thermoplasty, and advised to follow up again in August to re-assess status.  Zithromax 250 mg pill>>2 pills on first day, then 1 pill daily for next four days  Prednisone 10 mg pill>>4 pills for 2 days, 3 pills for 2 days, 2 pills for 2 days, 1 pill for 2 days, then resume prednisone 5 mg per day  Call if no better after few days  Call next week to update status and get letter to resume pulmonary rehab  Follow up in August 2013 with Dr. Meryle Ready 06/15/2012  Followuo for above  Since last visit has seen Dr Carmie Kanner. Per his note and her: skeptical that she is a good candidate for thermoplasty. He wil be seeing her again. She says she has been AE-asthma free since seeing me last but she did see Dr Craige Cotta and was Rx for ae-asthma. She says she is compliant with medications. She has no change in baseline asthma status. She is having isses with sleep apnea; last eval several years ago and not using her cpap. She wants to know if she is diabetic; several family members with DM  Past, Family, Social reviewed: no change since last visit    Review of Systems  Constitutional: Negative for fever and unexpected weight change.  HENT: Negative for ear pain, nosebleeds, congestion, sore throat, rhinorrhea, sneezing, trouble  swallowing, dental problem, postnasal drip and sinus pressure.   Eyes: Negative for redness and itching.  Respiratory: Positive for shortness of breath. Negative for cough, chest tightness and wheezing.   Cardiovascular: Negative for palpitations and leg swelling.  Gastrointestinal: Negative for nausea and vomiting.  Genitourinary: Negative for dysuria.  Musculoskeletal: Negative for joint swelling.  Skin: Negative for rash.  Neurological: Negative for headaches.  Hematological: Does not bruise/bleed easily.  Psychiatric/Behavioral: Negative for dysphoric mood. The patient is not nervous/anxious.        Objective:   Physical Exam General - Obese HEENT - clear mild tender over maxillary sinus Lt > Rt, no oral exudate, no LAN, TM clear Cardiac - s1s2 regular Chest - prolonged exhalation, b/l expiratory wheeze Abd - soft, non tender Ext - no edema Neuro - normal strength Psych - normal mood, behavior Skin - changes of eczema around her ears b/l         Assessment & Plan:

## 2012-06-16 ENCOUNTER — Encounter (HOSPITAL_COMMUNITY)
Admission: RE | Admit: 2012-06-16 | Discharge: 2012-06-16 | Disposition: A | Discharge status: Home / Self Care | Payer: Self-pay | Source: Ambulatory Visit | Attending: Internal Medicine | Admitting: Internal Medicine

## 2012-06-16 MED ORDER — OMALIZUMAB 150 MG ~~LOC~~ SOLR
300.0000 mg | Freq: Once | SUBCUTANEOUS | Status: AC
  Administered 2012-06-16: 300 mg via SUBCUTANEOUS

## 2012-06-18 ENCOUNTER — Encounter (HOSPITAL_COMMUNITY)
Admission: RE | Admit: 2012-06-18 | Discharge: 2012-06-18 | Disposition: A | Discharge status: Home / Self Care | Payer: Self-pay | Source: Ambulatory Visit | Attending: Internal Medicine | Admitting: Internal Medicine

## 2012-06-18 NOTE — Assessment & Plan Note (Signed)
Check Hgb A1C 

## 2012-06-18 NOTE — Assessment & Plan Note (Signed)
Refer DR Craige Cotta. Not compliant with cpap

## 2012-06-18 NOTE — Assessment & Plan Note (Signed)
#  Asthma  - stable today  - continue medications including xolair as scheduled  - follow with Dr Carmie Kanner  - regroup in 3 months for thermoplasty consideration followupo

## 2012-06-23 ENCOUNTER — Encounter (HOSPITAL_COMMUNITY): Payer: Self-pay

## 2012-06-25 ENCOUNTER — Encounter (HOSPITAL_COMMUNITY)
Admission: RE | Admit: 2012-06-25 | Discharge: 2012-06-25 | Disposition: A | Discharge status: Home / Self Care | Payer: Self-pay | Source: Ambulatory Visit | Attending: Internal Medicine | Admitting: Internal Medicine

## 2012-06-29 ENCOUNTER — Telehealth: Payer: Self-pay | Admitting: Internal Medicine

## 2012-06-29 NOTE — Telephone Encounter (Signed)
Pt called back.  States she is SOB and would like an appt.  Offered her an appt with TP tomorrow, but she says she cannot come at that time.  Wants to speak to nurse Leanora Ivanoff

## 2012-06-29 NOTE — Telephone Encounter (Signed)
ATC home # listed NA and unable to leave VM. I called cell # pt answered but never said anything [facility]

## 2012-06-30 ENCOUNTER — Encounter (HOSPITAL_COMMUNITY)
Admission: RE | Admit: 2012-06-30 | Discharge: 2012-06-30 | Disposition: A | Discharge status: Home / Self Care | Payer: Self-pay | Source: Ambulatory Visit | Attending: Internal Medicine | Admitting: Internal Medicine

## 2012-06-30 NOTE — Telephone Encounter (Signed)
Pt is currently at [facility].  TP is in [facility] this morning with plenty of openings.  Called [facility] for nurse from rehab [facility].  D/t pt's c/o SOB, will hold in triage.

## 2012-06-30 NOTE — Telephone Encounter (Signed)
Liborio Nixon from cardiac rehab returning call can be reached at [phone].Raylene Everts

## 2012-06-30 NOTE — Telephone Encounter (Signed)
Called, spoke with Jamesetta So with [facility].  States pt informed her she received a bill regarding an injection and this made her "really upset."  Jamesetta So rechecked with pt stated she is "a ok" now -- states her SOB was from her anxiety from the bill.  Reports pt informed her she spoke with Budd Palmer who answered her questions regarding this and no OV needed now.  I spoke with Budd Palmer -- he did speak with pt and answered her questions.  Will sign off on this msg.

## 2012-07-02 ENCOUNTER — Encounter (HOSPITAL_COMMUNITY)
Admission: RE | Admit: 2012-07-02 | Discharge: 2012-07-02 | Disposition: A | Discharge status: Home / Self Care | Payer: Self-pay | Source: Ambulatory Visit | Attending: Internal Medicine | Admitting: Internal Medicine

## 2012-07-02 ENCOUNTER — Other Ambulatory Visit: Payer: Self-pay | Admitting: Internal Medicine

## 2012-07-02 DIAGNOSIS — Z1231 Encounter for screening mammogram for malignant neoplasm of breast: Secondary | ICD-10-CM

## 2012-07-07 ENCOUNTER — Encounter (HOSPITAL_COMMUNITY): Admission: RE | Admit: 2012-07-07 | Payer: Medicare Other | Source: Ambulatory Visit

## 2012-07-09 ENCOUNTER — Other Ambulatory Visit: Payer: Self-pay | Admitting: *Deleted

## 2012-07-09 ENCOUNTER — Encounter (HOSPITAL_COMMUNITY)
Admission: RE | Admit: 2012-07-09 | Discharge: 2012-07-09 | Disposition: A | Discharge status: Home / Self Care | Payer: Self-pay | Source: Ambulatory Visit | Attending: Internal Medicine | Admitting: Internal Medicine

## 2012-07-09 MED ORDER — ALPRAZOLAM 0.5 MG PO TABS: 0.5000 mg | ORAL_TABLET | Freq: Four times a day (QID) | ORAL | Status: DC | PRN

## 2012-07-09 NOTE — Telephone Encounter (Signed)
Received fax pt requesting refill on her alprazolam.../LMB

## 2012-07-10 ENCOUNTER — Ambulatory Visit: Payer: Medicare Other | Admitting: Internal Medicine

## 2012-07-10 NOTE — Telephone Encounter (Signed)
Notified pharmacy spoke with Huntley Dec gave med approval. MD already updated EPIC..../LMB

## 2012-07-14 ENCOUNTER — Encounter (HOSPITAL_COMMUNITY)
Admission: RE | Admit: 2012-07-14 | Discharge: 2012-07-14 | Disposition: A | Discharge status: Home / Self Care | Payer: Self-pay | Source: Ambulatory Visit | Attending: Internal Medicine | Admitting: Internal Medicine

## 2012-07-14 DIAGNOSIS — K219 Gastro-esophageal reflux disease without esophagitis: Secondary | ICD-10-CM | POA: Insufficient documentation

## 2012-07-14 DIAGNOSIS — Z5189 Encounter for other specified aftercare: Secondary | ICD-10-CM | POA: Insufficient documentation

## 2012-07-14 DIAGNOSIS — M810 Age-related osteoporosis without current pathological fracture: Secondary | ICD-10-CM | POA: Insufficient documentation

## 2012-07-14 DIAGNOSIS — J4489 Other specified chronic obstructive pulmonary disease: Secondary | ICD-10-CM | POA: Insufficient documentation

## 2012-07-14 DIAGNOSIS — J449 Chronic obstructive pulmonary disease, unspecified: Secondary | ICD-10-CM | POA: Insufficient documentation

## 2012-07-14 DIAGNOSIS — G4730 Sleep apnea, unspecified: Secondary | ICD-10-CM | POA: Insufficient documentation

## 2012-07-15 ENCOUNTER — Encounter: Payer: Self-pay | Admitting: Pulmonary Disease

## 2012-07-15 ENCOUNTER — Ambulatory Visit (INDEPENDENT_AMBULATORY_CARE_PROVIDER_SITE_OTHER): Payer: Medicare Other

## 2012-07-15 ENCOUNTER — Other Ambulatory Visit (INDEPENDENT_AMBULATORY_CARE_PROVIDER_SITE_OTHER): Payer: Medicare Other

## 2012-07-15 ENCOUNTER — Ambulatory Visit (INDEPENDENT_AMBULATORY_CARE_PROVIDER_SITE_OTHER): Payer: Medicare Other | Admitting: Pulmonary Disease

## 2012-07-15 VITALS — BP 138/88 | HR 66 | Temp 97.8°F | Ht 63.5 in | Wt 188.4 lb

## 2012-07-15 DIAGNOSIS — E669 Obesity, unspecified: Secondary | ICD-10-CM

## 2012-07-15 DIAGNOSIS — R7309 Other abnormal glucose: Secondary | ICD-10-CM

## 2012-07-15 DIAGNOSIS — E8881 Metabolic syndrome: Secondary | ICD-10-CM

## 2012-07-15 DIAGNOSIS — Z833 Family history of diabetes mellitus: Secondary | ICD-10-CM

## 2012-07-15 DIAGNOSIS — Z5181 Encounter for therapeutic drug level monitoring: Secondary | ICD-10-CM | POA: Diagnosis not present

## 2012-07-15 DIAGNOSIS — R7303 Prediabetes: Secondary | ICD-10-CM

## 2012-07-15 DIAGNOSIS — J45909 Unspecified asthma, uncomplicated: Secondary | ICD-10-CM

## 2012-07-15 DIAGNOSIS — G4733 Obstructive sleep apnea (adult) (pediatric): Secondary | ICD-10-CM | POA: Diagnosis not present

## 2012-07-15 DIAGNOSIS — R5381 Other malaise: Secondary | ICD-10-CM

## 2012-07-15 DIAGNOSIS — R5383 Other fatigue: Secondary | ICD-10-CM

## 2012-07-15 DIAGNOSIS — J4550 Severe persistent asthma, uncomplicated: Secondary | ICD-10-CM

## 2012-07-15 LAB — HEMOGLOBIN A1C: Hgb A1c MFr Bld: 6.3 % (ref 4.6–6.5)

## 2012-07-15 MED ORDER — OMALIZUMAB 150 MG ~~LOC~~ SOLR
300.0000 mg | Freq: Once | SUBCUTANEOUS | Status: AC
  Administered 2012-07-15: 300 mg via SUBCUTANEOUS

## 2012-07-15 NOTE — Assessment & Plan Note (Signed)
She has recurrent asthma exacerbation.  I don't think she has a bacterial infection, and therefore don't think she needs antibiotics.  I will increase her prednisone for several days, with gradual taper back to 5 mg daily.  She is to continue her other asthma regimen.  Advised that it is okay for her to get xolair shot today.  She is to call back if prednisone does not help.

## 2012-07-15 NOTE — Assessment & Plan Note (Signed)
She has hx of OSA from 2006.  She reports improvement with CPAP, but needs new supplies.  Will contact her DME to arrange for new supplies.  Will also get download from her CPAP and then decide if changes are needed to her set up.
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
## 2012-07-15 NOTE — Progress Notes (Deleted)
  Subjective:    Patient ID: [name] [name], female    DOB: 10-Apr-1941, 71 y.o.   MRN: [id number]  HPI    Review of Systems  Constitutional: Negative for fever, appetite change and unexpected weight change.  HENT: Positive for congestion and sneezing. Negative for ear pain, sore throat, trouble swallowing and dental problem.   Respiratory: Positive for cough and shortness of breath.   Cardiovascular: Negative for chest pain, palpitations and leg swelling.  Gastrointestinal: Negative for abdominal pain.  Musculoskeletal: Negative for joint swelling.  Skin: Negative for rash.  Neurological: Positive for headaches.  Psychiatric/Behavioral: Negative for dysphoric mood. The patient is nervous/anxious.        Objective:   Physical Exam        Assessment & Plan:

## 2012-07-15 NOTE — Patient Instructions (Signed)
Prednisone 5 mg pills>>4 pills for 2 days, 3 pills for 2 days, 2 pills for 2 days, then 1 pill daily Will arrange for new CPAP supplies Will get report from CPAP machine and call with results Follow up in 4 months

## 2012-07-16 ENCOUNTER — Encounter (HOSPITAL_COMMUNITY)
Admission: RE | Admit: 2012-07-16 | Discharge: 2012-07-16 | Disposition: A | Discharge status: Home / Self Care | Payer: Self-pay | Source: Ambulatory Visit | Attending: Internal Medicine | Admitting: Internal Medicine

## 2012-07-21 ENCOUNTER — Encounter (HOSPITAL_COMMUNITY): Payer: Self-pay

## 2012-07-23 ENCOUNTER — Encounter: Payer: Self-pay | Admitting: Internal Medicine

## 2012-07-23 ENCOUNTER — Other Ambulatory Visit (INDEPENDENT_AMBULATORY_CARE_PROVIDER_SITE_OTHER): Payer: Medicare Other

## 2012-07-23 ENCOUNTER — Encounter (HOSPITAL_COMMUNITY): Payer: Self-pay

## 2012-07-23 ENCOUNTER — Ambulatory Visit (INDEPENDENT_AMBULATORY_CARE_PROVIDER_SITE_OTHER): Payer: Medicare Other | Admitting: Internal Medicine

## 2012-07-23 VITALS — BP 112/72 | HR 67 | Temp 97.8°F | Resp 16 | Wt 190.0 lb

## 2012-07-23 DIAGNOSIS — I10 Essential (primary) hypertension: Secondary | ICD-10-CM | POA: Diagnosis not present

## 2012-07-23 DIAGNOSIS — J45909 Unspecified asthma, uncomplicated: Secondary | ICD-10-CM

## 2012-07-23 DIAGNOSIS — Z0000 Encounter for general adult medical examination without abnormal findings: Secondary | ICD-10-CM | POA: Diagnosis not present

## 2012-07-23 DIAGNOSIS — E785 Hyperlipidemia, unspecified: Secondary | ICD-10-CM | POA: Diagnosis not present

## 2012-07-23 DIAGNOSIS — N182 Chronic kidney disease, stage 2 (mild): Secondary | ICD-10-CM | POA: Diagnosis not present

## 2012-07-23 DIAGNOSIS — G4762 Sleep related leg cramps: Secondary | ICD-10-CM

## 2012-07-23 DIAGNOSIS — J4550 Severe persistent asthma, uncomplicated: Secondary | ICD-10-CM

## 2012-07-23 LAB — COMPREHENSIVE METABOLIC PANEL
ALT: 21 U/L (ref 0–35)
AST: 30 U/L (ref 0–37)
Albumin: 3.8 g/dL (ref 3.5–5.2)
Alkaline Phosphatase: 71 U/L (ref 39–117)
BUN: 19 mg/dL (ref 6–23)
Calcium: 9.5 mg/dL (ref 8.4–10.5)
Chloride: 107 mEq/L (ref 96–112)
Potassium: 4 mEq/L (ref 3.5–5.1)

## 2012-07-23 LAB — CBC WITH DIFFERENTIAL/PLATELET
Basophils Relative: 0.7 % (ref 0.0–3.0)
Eosinophils Absolute: 0.8 10*3/uL — ABNORMAL HIGH (ref 0.0–0.7)
Eosinophils Relative: 8.5 % — ABNORMAL HIGH (ref 0.0–5.0)
HCT: 33.6 % — ABNORMAL LOW (ref 36.0–46.0)
Hemoglobin: 10.5 g/dL — ABNORMAL LOW (ref 12.0–15.0)
Lymphs Abs: 2.5 10*3/uL (ref 0.7–4.0)
MCHC: 31.3 g/dL (ref 30.0–36.0)
MCV: 83.9 fl (ref 78.0–100.0)
Monocytes Absolute: 0.9 10*3/uL (ref 0.1–1.0)
Neutro Abs: 5 10*3/uL (ref 1.4–7.7)
RBC: 4 Mil/uL (ref 3.87–5.11)
WBC: 9.3 10*3/uL (ref 4.5–10.5)

## 2012-07-23 LAB — HEPATIC FUNCTION PANEL
ALT: 21 U/L (ref 0–35)
Albumin: 3.8 g/dL (ref 3.5–5.2)
Total Protein: 7.8 g/dL (ref 6.0–8.3)

## 2012-07-23 LAB — LIPID PANEL: HDL: 46.5 mg/dL (ref >39.00)
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
## 2012-07-23 NOTE — Progress Notes (Deleted)
Subjective:     Patient ID: [name] [name], female   DOB: Jul 23, 1941, 71 y.o.   MRN: [id number]  HPI Comments: The patient is here for annual Medicare wellness examination and management of other chronic and acute problems.   The risk factors are reflected in the social history.  The roster of all physicians providing medical care to patient - is listed in the Snapshot section of the chart.  Activities of daily living:  The patient is 100% independent in all ADLs: dressing, toileting, feeding as well as independent mobility  Home safety : The patient has smoke detectors in the home. They wear seatbelts.No firearms at home ( firearms are present in the home, kept in a safe fashion). There is no violence in the home.   There is no risks for hepatitis, STDs or HIV. There is no   history of blood transfusion. They have no travel history to infectious disease endemic areas of the world.  The patient has (has not) seen their dentist in the last six month. They have (not) seen their eye doctor in the last year. They deny (admit to) any hearing difficulty and have not had audiologic testing in the last year.  They do not  have excessive sun exposure. Discussed the need for sun protection: hats, long sleeves and use of sunscreen if there is significant sun exposure.   Diet: the importance of a healthy diet is discussed. They do have a healthy (unhealthy-high fat/fast food) diet.  The patient has a regular exercise program: _______ , ____duration, _____per week.  The benefits of regular aerobic exercise were discussed.  Depression screen: there are no signs or vegative symptoms of depression- irritability, change in appetite, anhedonia, sadness/tearfullness.  Cognitive assessment: the patient manages all their financial and personal affairs and is actively engaged. They could relate day,date,year and events; recalled 3/3 objects at 3 minutes; performed clock-face test normally.  The following portions of  the patient's history were reviewed and updated as appropriate: allergies, current medications, past family history, past medical history,  past surgical history, past social history  and problem list.  Vision, hearing, body mass index were assessed and reviewed.   During the course of the visit the patient was educated and counseled about appropriate screening and preventive services including : fall prevention , diabetes screening, nutrition counseling, colorectal cancer screening, and recommended immunizations.    Review of Systems     Objective:   Physical Exam     Assessment:     ***    Plan:     ***

## 2012-07-24 ENCOUNTER — Telehealth: Payer: Self-pay | Admitting: Pulmonary Disease

## 2012-07-24 DIAGNOSIS — G4733 Obstructive sleep apnea (adult) (pediatric): Secondary | ICD-10-CM

## 2012-07-24 NOTE — Telephone Encounter (Signed)
Called and spoke with pt and she stated that [facility] told her we needed to send in an rx for her cpap supplies.  i explained to pt that [facility] had already sent this to them but i have placed another order for her to get her supplies.  She stated that she is up most of the night coughing and is really needing these.  Pt voiced her understanding of this and is aware that we will get this order sent over for her.  Nothing further is needed.

## 2012-07-26 DIAGNOSIS — Z0000 Encounter for general adult medical examination without abnormal findings: Secondary | ICD-10-CM | POA: Insufficient documentation

## 2012-07-26 NOTE — Assessment & Plan Note (Signed)
Stable at today's exam.  Plan- routine follow-up with pulmonary service.

## 2012-07-26 NOTE — Assessment & Plan Note (Signed)
Mild progression of disease with creatinine up from 1.35 to 1.5. Highest in the past = 1.4 May '10.  Plan Risk modification: good BP control

## 2012-07-26 NOTE — Assessment & Plan Note (Signed)
LDL is above goal of 130 or less. Currently on no medication. LDL is below threshold for mandatory medical therapy of 160+.  Plan Life-style management with low fat diet.  Repeat lab in 6 months (order entered).

## 2012-07-26 NOTE — Assessment & Plan Note (Signed)
Interval medical history - multiple chronic problems stable at today's visit. Physical exam sans breast and pelvic exam is stable w/o respiratory distress today. She is current with Gyn, current with breast cancer and colorectal cancer screening. Immunizations are up to date except for shingles vaccine.  In summary - a nice woman with multiple medical problems who appears stable at today's visit. She will return as needed or in 3 months. She will return in 6 months, March '14, for repeat lipid panel.

## 2012-07-26 NOTE — Assessment & Plan Note (Signed)
BP Readings from Last 3 Encounters:  07/23/12 112/72  07/15/12 138/88  06/15/12 122/82   Good control on present medications

## 2012-07-27 ENCOUNTER — Ambulatory Visit: Payer: Medicare Other

## 2012-07-28 ENCOUNTER — Encounter (HOSPITAL_COMMUNITY)
Admission: RE | Admit: 2012-07-28 | Discharge: 2012-07-28 | Disposition: A | Discharge status: Home / Self Care | Payer: Self-pay | Source: Ambulatory Visit | Attending: Internal Medicine | Admitting: Internal Medicine

## 2012-07-30 ENCOUNTER — Encounter (HOSPITAL_COMMUNITY)
Admission: RE | Admit: 2012-07-30 | Discharge: 2012-07-30 | Disposition: A | Discharge status: Home / Self Care | Payer: Self-pay | Source: Ambulatory Visit | Attending: Internal Medicine | Admitting: Internal Medicine

## 2012-08-04 ENCOUNTER — Encounter (HOSPITAL_COMMUNITY): Payer: Self-pay

## 2012-08-06 ENCOUNTER — Encounter (HOSPITAL_COMMUNITY): Payer: Self-pay

## 2012-08-11 ENCOUNTER — Encounter (HOSPITAL_COMMUNITY)
Admission: RE | Admit: 2012-08-11 | Discharge: 2012-08-11 | Disposition: A | Discharge status: Home / Self Care | Payer: Self-pay | Source: Ambulatory Visit | Attending: Internal Medicine | Admitting: Internal Medicine

## 2012-08-11 DIAGNOSIS — G4730 Sleep apnea, unspecified: Secondary | ICD-10-CM | POA: Insufficient documentation

## 2012-08-11 DIAGNOSIS — M810 Age-related osteoporosis without current pathological fracture: Secondary | ICD-10-CM | POA: Insufficient documentation

## 2012-08-11 DIAGNOSIS — J4489 Other specified chronic obstructive pulmonary disease: Secondary | ICD-10-CM | POA: Insufficient documentation

## 2012-08-11 DIAGNOSIS — J449 Chronic obstructive pulmonary disease, unspecified: Secondary | ICD-10-CM | POA: Insufficient documentation

## 2012-08-11 DIAGNOSIS — K219 Gastro-esophageal reflux disease without esophagitis: Secondary | ICD-10-CM | POA: Insufficient documentation

## 2012-08-11 DIAGNOSIS — Z5189 Encounter for other specified aftercare: Secondary | ICD-10-CM | POA: Insufficient documentation

## 2012-08-13 ENCOUNTER — Encounter (HOSPITAL_COMMUNITY)
Admission: RE | Admit: 2012-08-13 | Discharge: 2012-08-13 | Disposition: A | Discharge status: Home / Self Care | Payer: Self-pay | Source: Ambulatory Visit | Attending: Internal Medicine | Admitting: Internal Medicine

## 2012-08-17 ENCOUNTER — Ambulatory Visit (INDEPENDENT_AMBULATORY_CARE_PROVIDER_SITE_OTHER): Payer: Medicare Other

## 2012-08-17 DIAGNOSIS — J45909 Unspecified asthma, uncomplicated: Secondary | ICD-10-CM

## 2012-08-18 ENCOUNTER — Encounter (HOSPITAL_COMMUNITY)
Admission: RE | Admit: 2012-08-18 | Discharge: 2012-08-18 | Disposition: A | Discharge status: Home / Self Care | Payer: Self-pay | Source: Ambulatory Visit | Attending: Internal Medicine | Admitting: Internal Medicine

## 2012-08-18 MED ORDER — OMALIZUMAB 150 MG ~~LOC~~ SOLR
300.0000 mg | Freq: Once | SUBCUTANEOUS | Status: AC
  Administered 2012-08-18: 300 mg via SUBCUTANEOUS

## 2012-08-20 ENCOUNTER — Encounter (HOSPITAL_COMMUNITY)
Admission: RE | Admit: 2012-08-20 | Discharge: 2012-08-20 | Disposition: A | Discharge status: Home / Self Care | Payer: Self-pay | Source: Ambulatory Visit | Attending: Internal Medicine | Admitting: Internal Medicine

## 2012-08-25 ENCOUNTER — Encounter (HOSPITAL_COMMUNITY)
Admission: RE | Admit: 2012-08-25 | Discharge: 2012-08-25 | Disposition: A | Discharge status: Home / Self Care | Payer: Self-pay | Source: Ambulatory Visit | Attending: Internal Medicine | Admitting: Internal Medicine

## 2012-08-27 ENCOUNTER — Encounter (HOSPITAL_COMMUNITY)
Admission: RE | Admit: 2012-08-27 | Discharge: 2012-08-27 | Disposition: A | Discharge status: Home / Self Care | Payer: Self-pay | Source: Ambulatory Visit | Attending: Internal Medicine | Admitting: Internal Medicine

## 2012-09-01 ENCOUNTER — Encounter (HOSPITAL_COMMUNITY): Payer: Self-pay

## 2012-09-01 DIAGNOSIS — Z961 Presence of intraocular lens: Secondary | ICD-10-CM | POA: Diagnosis not present

## 2012-09-01 DIAGNOSIS — H04129 Dry eye syndrome of unspecified lacrimal gland: Secondary | ICD-10-CM | POA: Diagnosis not present

## 2012-09-01 DIAGNOSIS — H1045 Other chronic allergic conjunctivitis: Secondary | ICD-10-CM | POA: Diagnosis not present

## 2012-09-03 ENCOUNTER — Encounter (HOSPITAL_COMMUNITY): Payer: Self-pay

## 2012-09-04 ENCOUNTER — Telehealth: Payer: Self-pay | Admitting: Internal Medicine

## 2012-09-04 ENCOUNTER — Other Ambulatory Visit: Payer: Self-pay | Admitting: *Deleted

## 2012-09-04 MED ORDER — LEVALBUTEROL HCL 0.63 MG/3ML IN NEBU: 1.0000 | INHALATION_SOLUTION | Freq: Four times a day (QID) | RESPIRATORY_TRACT | Status: DC

## 2012-09-04 NOTE — Telephone Encounter (Signed)
Refill sent for xopenex neb. the patient is aware. Carron Curie, CMA

## 2012-09-08 ENCOUNTER — Encounter (HOSPITAL_COMMUNITY)
Admission: RE | Admit: 2012-09-08 | Discharge: 2012-09-08 | Disposition: A | Discharge status: Home / Self Care | Payer: Self-pay | Source: Ambulatory Visit | Attending: Internal Medicine | Admitting: Internal Medicine

## 2012-09-10 ENCOUNTER — Encounter (HOSPITAL_COMMUNITY)
Admission: RE | Admit: 2012-09-10 | Discharge: 2012-09-10 | Disposition: A | Discharge status: Home / Self Care | Payer: Self-pay | Source: Ambulatory Visit | Attending: Internal Medicine | Admitting: Internal Medicine

## 2012-09-14 ENCOUNTER — Ambulatory Visit (INDEPENDENT_AMBULATORY_CARE_PROVIDER_SITE_OTHER): Payer: Medicare Other

## 2012-09-14 ENCOUNTER — Ambulatory Visit (INDEPENDENT_AMBULATORY_CARE_PROVIDER_SITE_OTHER): Payer: Medicare Other | Admitting: Internal Medicine

## 2012-09-14 VITALS — BP 140/80 | HR 74 | Ht 63.5 in | Wt 192.4 lb

## 2012-09-14 DIAGNOSIS — J45909 Unspecified asthma, uncomplicated: Secondary | ICD-10-CM

## 2012-09-14 DIAGNOSIS — J4550 Severe persistent asthma, uncomplicated: Secondary | ICD-10-CM

## 2012-09-14 MED ORDER — LEVOFLOXACIN 500 MG PO TABS: 500.0000 mg | ORAL_TABLET | Freq: Every day | ORAL | Status: DC

## 2012-09-14 MED ORDER — PREDNISONE 10 MG PO TABS: ORAL_TABLET | ORAL | Status: DC

## 2012-09-14 NOTE — Progress Notes (Signed)
Subjective:    Patient ID: [name] [name], female    DOB: [birth year], 71 y.o.   MRN: [id number]  HPI [name] [name] is a 71 y.o. female with severe asthma, bronchiectasis, allergic rhinitis.  She is followed by Dr. Marchelle Gearing.  She is on chronic prednisone and xolair.  She is normally on 2 to 5 mg prednisone per day.  She had to increase this to 10 mg per day since July 4.  She has increased cough and yellow sputum.  She has more sinus pressure, and post nasal drip.  She had temperature up to 100F 2 days ago.  She has ear pain, sore throat, and increased wheeze.  Her chest gets sore from coughing.  She is now using her nebulizer 4 to 5 times per day.    She was seen by Dr. Carmie Kanner for thermoplasty, and advised to follow up again in August to re-assess status.  Zithromax 250 mg pill>>2 pills on first day, then 1 pill daily for next four days  Prednisone 10 mg pill>>4 pills for 2 days, 3 pills for 2 days, 2 pills for 2 days, 1 pill for 2 days, then resume prednisone 5 mg per day  Call if no better after few days  Call next week to update status and get letter to resume pulmonary rehab  Follow up in August 2013 with Dr. Meryle Ready 06/15/2012  Followuo for above  Since last visit has seen Dr Carmie Kanner. Per his note and her: skeptical that she is a good candidate for thermoplasty. He wil be seeing her again. She says she has been AE-asthma free since seeing me last but she did see Dr Craige Cotta and was Rx for ae-asthma. She says she is compliant with medications. She has no change in baseline asthma status. She is having isses with sleep apnea; last eval several years ago and not using her cpap. She wants to know if she is diabetic; several family members with DM  Past, Family, Social reviewed: no change since last visit     #Asthma  - stable today  - continue medications including xolair as scheduled  - follow with Dr Carmie Kanner  - regroup in 3 months  #Sleep apnea  - refer to Dr Craige Cotta  #General  health  - check ha1c blood test today - 3 months average sugar  #Followu  3 months with spirometry test   OV 09/14/2012 Last 3 days increased chest congestion. Today, yellow sputum. Feels she is in [place]. She is unable to keep appt with [facility] for thermoplasty eval due to logistics. Hoewver, overal trend in asthma is better since starting xolair. No other new issues.  She maintains she is compliant with her mdi  Past, Family, Social reviewed: no change since last visit   Review of Systems  Constitutional: Positive for fatigue. Negative for fever and unexpected weight change.  HENT: Negative for ear pain, nosebleeds, congestion, sore throat, rhinorrhea, sneezing, trouble swallowing, dental problem, postnasal drip and sinus pressure.   Eyes: Negative for redness and itching.  Respiratory: Positive for shortness of breath. Negative for cough, chest tightness and wheezing.   Cardiovascular: Negative for palpitations and leg swelling.  Gastrointestinal: Negative for nausea and vomiting.  Genitourinary: Negative for dysuria.  Musculoskeletal: Negative for joint swelling.  Skin: Negative for rash.  Neurological: Negative for headaches.  Hematological: Does not bruise/bleed easily.  Psychiatric/Behavioral: Negative for dysphoric mood. The patient is not nervous/anxious.   Current outpatient prescriptions:ALPRAZolam (XANAX) 0.5 MG tablet,  Take 1 tablet (0.5 mg total) by mouth every 6 (six) hours as needed. For anxiety, Disp: 60 tablet, Rfl: 5;  amLODipine (NORVASC) 5 MG tablet, Take 1 tablet (5 mg total) by mouth daily., Disp: 30 tablet, Rfl: 11;  budesonide-formoterol (SYMBICORT) 160-4.5 MCG/ACT inhaler, Inhale 2 puffs into the lungs 2 (two) times daily. Rinse mouth, Disp: 1 Inhaler, Rfl: 11 cycloSPORINE (RESTASIS) 0.05 % ophthalmic emulsion, Place 1 drop into both eyes 2 (two) times daily. BMN, Disp: 0.4 mL, Rfl: 3;  desloratadine (CLARINEX) 5 MG tablet, Take 1 tablet (5 mg total) by mouth  every evening. BMN, Disp: 30 tablet, Rfl: 11;  EPINEPHrine (EPIPEN) 0.3 mg/0.3 mL DEVI, Inject 0.3 mLs (0.3 mg total) into the muscle once as needed. Use as directed for severe allergic reaction, Disp: 1 Device, Rfl: 11 esomeprazole (NEXIUM) 40 MG capsule, Take 1 capsule (40 mg total) by mouth daily before breakfast., Disp: 30 capsule, Rfl: 11;  hydrochlorothiazide (HYDRODIURIL) 25 MG tablet, Take 1 tablet (25 mg total) by mouth daily., Disp: 30 tablet, Rfl: 11;  HYDROcodone-acetaminophen (VICODIN ES) 7.5-750 MG per tablet, Take 1 tablet by mouth every 6 (six) hours as needed. For pain, Disp: 90 tablet, Rfl: 3 levalbuterol (XOPENEX HFA) 45 MCG/ACT inhaler, Inhale 2 puffs into the lungs every 4 (four) hours as needed. Shortness of breath, Disp: 1 Inhaler, Rfl: 11;  levalbuterol (XOPENEX) 0.63 MG/3ML nebulizer solution, Take 3 mLs (0.63 mg total) by nebulization every 6 (six) hours. Dx: 493.90, Disp: 360 mL, Rfl: 6;  olopatadine (PATANOL) 0.1 % ophthalmic solution, Place 2 drops into both eyes 2 (two) times daily., Disp: 5 mL, Rfl: 2 Olopatadine HCl (PATANASE) 0.6 % SOLN, Place 2 drops (2 puffs total) into the nose daily., Disp: 30.5 g, Rfl: 5;  peg 3350 powder (MOVIPREP) SOLR, Take 1 kit (100 g total) by mouth once., Disp: 1 kit, Rfl: 0;  SINGULAIR 10 MG tablet, Take 1 tablet (10 mg total) by mouth daily. BRAND NAME ONLY, Disp: 90 tablet, Rfl: 3 [DISCONTINUED] levalbuterol (XOPENEX HFA) 45 MCG/ACT inhaler, Inhale 2 puffs into the lungs every 4 (four) hours as needed. Shortness of breath, Disp: 1 Inhaler, Rfl: 11      Objective:   Physical Exam General - Obese HEENT - clear mild tender over maxillary sinus Lt > Rt, no oral exudate, no LAN, TM clear Cardiac - s1s2 regular Chest - prolonged exhalation, b/l expiratory wheeze Abd - soft, non tender Ext - no edema Neuro - normal strength Psych - normal mood, behavior Skin - changes of eczema around her ears b/l            Assessment & Plan:

## 2012-09-14 NOTE — Patient Instructions (Addendum)
You have an asthma attack brewing though there is not much wheeze on exam  take levaquin 500mg  once daily  X 5 days Please take Take prednisone 40mg  once daily x 3 days, then 30mg  once daily x 3 days, then 20mg  once daily x 3 days, then prednisone 10mg  once daily  x 3 days and then daily 5mg  Follow with Dr Carmie Kanner when you can Return in 6 months

## 2012-09-15 ENCOUNTER — Encounter (HOSPITAL_COMMUNITY): Payer: Self-pay

## 2012-09-15 DIAGNOSIS — J4489 Other specified chronic obstructive pulmonary disease: Secondary | ICD-10-CM | POA: Insufficient documentation

## 2012-09-15 DIAGNOSIS — M810 Age-related osteoporosis without current pathological fracture: Secondary | ICD-10-CM | POA: Insufficient documentation

## 2012-09-15 DIAGNOSIS — Z5189 Encounter for other specified aftercare: Secondary | ICD-10-CM | POA: Insufficient documentation

## 2012-09-15 DIAGNOSIS — K219 Gastro-esophageal reflux disease without esophagitis: Secondary | ICD-10-CM | POA: Insufficient documentation

## 2012-09-15 DIAGNOSIS — J449 Chronic obstructive pulmonary disease, unspecified: Secondary | ICD-10-CM | POA: Insufficient documentation

## 2012-09-15 DIAGNOSIS — G4730 Sleep apnea, unspecified: Secondary | ICD-10-CM | POA: Insufficient documentation

## 2012-09-15 MED ORDER — OMALIZUMAB 150 MG ~~LOC~~ SOLR
300.0000 mg | Freq: Once | SUBCUTANEOUS | Status: AC
  Administered 2012-09-15: 300 mg via SUBCUTANEOUS

## 2012-09-17 ENCOUNTER — Encounter (HOSPITAL_COMMUNITY)
Admission: RE | Admit: 2012-09-17 | Discharge: 2012-09-17 | Disposition: A | Discharge status: Home / Self Care | Payer: Self-pay | Source: Ambulatory Visit | Attending: Internal Medicine | Admitting: Internal Medicine

## 2012-09-22 ENCOUNTER — Encounter (HOSPITAL_COMMUNITY)
Admission: RE | Admit: 2012-09-22 | Discharge: 2012-09-22 | Disposition: A | Discharge status: Home / Self Care | Payer: Self-pay | Source: Ambulatory Visit | Attending: Internal Medicine | Admitting: Internal Medicine

## 2012-09-24 ENCOUNTER — Encounter (HOSPITAL_COMMUNITY): Admission: RE | Admit: 2012-09-24 | Payer: Self-pay | Source: Ambulatory Visit

## 2012-09-27 NOTE — Assessment & Plan Note (Signed)
You have an asthma attack brewing though there is not much wheeze on exam  take levaquin 500mg once daily  X 5 days Please take Take prednisone 40mg once daily x 3 days, then 30mg once daily x 3 days, then 20mg once daily x 3 days, then prednisone 10mg once daily  x 3 days and then daily 5mg Follow with Dr Wahidi when you can Return in 6 months  

## 2012-09-29 ENCOUNTER — Encounter (HOSPITAL_COMMUNITY)
Admission: RE | Admit: 2012-09-29 | Discharge: 2012-09-29 | Disposition: A | Discharge status: Home / Self Care | Payer: Self-pay | Source: Ambulatory Visit | Attending: Internal Medicine | Admitting: Internal Medicine

## 2012-10-01 ENCOUNTER — Encounter (HOSPITAL_COMMUNITY): Payer: Self-pay

## 2012-10-06 ENCOUNTER — Encounter (HOSPITAL_COMMUNITY): Payer: Self-pay

## 2012-10-08 ENCOUNTER — Encounter (HOSPITAL_COMMUNITY): Payer: Self-pay

## 2012-10-12 ENCOUNTER — Ambulatory Visit (INDEPENDENT_AMBULATORY_CARE_PROVIDER_SITE_OTHER): Payer: Medicare Other | Admitting: Internal Medicine

## 2012-10-12 ENCOUNTER — Other Ambulatory Visit: Payer: Self-pay | Admitting: *Deleted

## 2012-10-12 ENCOUNTER — Ambulatory Visit (INDEPENDENT_AMBULATORY_CARE_PROVIDER_SITE_OTHER): Payer: Medicare Other

## 2012-10-12 ENCOUNTER — Encounter: Payer: Self-pay | Admitting: Internal Medicine

## 2012-10-12 VITALS — BP 122/68 | HR 68 | Temp 97.0°F | Resp 12 | Wt 188.1 lb

## 2012-10-12 DIAGNOSIS — I10 Essential (primary) hypertension: Secondary | ICD-10-CM

## 2012-10-12 DIAGNOSIS — R040 Epistaxis: Secondary | ICD-10-CM

## 2012-10-12 DIAGNOSIS — E785 Hyperlipidemia, unspecified: Secondary | ICD-10-CM | POA: Diagnosis not present

## 2012-10-12 DIAGNOSIS — N2889 Other specified disorders of kidney and ureter: Secondary | ICD-10-CM

## 2012-10-12 DIAGNOSIS — J45909 Unspecified asthma, uncomplicated: Secondary | ICD-10-CM

## 2012-10-12 DIAGNOSIS — R7309 Other abnormal glucose: Secondary | ICD-10-CM

## 2012-10-12 DIAGNOSIS — F339 Major depressive disorder, recurrent, unspecified: Secondary | ICD-10-CM | POA: Diagnosis not present

## 2012-10-12 DIAGNOSIS — K219 Gastro-esophageal reflux disease without esophagitis: Secondary | ICD-10-CM

## 2012-10-12 DIAGNOSIS — R21 Rash and other nonspecific skin eruption: Secondary | ICD-10-CM

## 2012-10-12 DIAGNOSIS — N182 Chronic kidney disease, stage 2 (mild): Secondary | ICD-10-CM

## 2012-10-12 DIAGNOSIS — R739 Hyperglycemia, unspecified: Secondary | ICD-10-CM

## 2012-10-12 MED ORDER — HYDROCHLOROTHIAZIDE 25 MG PO TABS: 25.0000 mg | ORAL_TABLET | Freq: Every day | ORAL | Status: DC

## 2012-10-12 MED ORDER — EPINEPHRINE 0.3 MG/0.3ML IJ DEVI: 0.3000 mg | Freq: Once | INTRAMUSCULAR | Status: DC | PRN

## 2012-10-12 MED ORDER — SINGULAIR 10 MG PO TABS: 10.0000 mg | ORAL_TABLET | Freq: Every day | ORAL | Status: DC

## 2012-10-12 MED ORDER — DESLORATADINE 5 MG PO TABS: 5.0000 mg | ORAL_TABLET | Freq: Every evening | ORAL | Status: DC

## 2012-10-12 MED ORDER — BUDESONIDE-FORMOTEROL FUMARATE 160-4.5 MCG/ACT IN AERO: 2.0000 | INHALATION_SPRAY | Freq: Two times a day (BID) | RESPIRATORY_TRACT | Status: DC

## 2012-10-12 MED ORDER — HYDROCODONE-ACETAMINOPHEN 7.5-750 MG PO TABS: 1.0000 | ORAL_TABLET | Freq: Four times a day (QID) | ORAL | Status: DC | PRN

## 2012-10-12 NOTE — Assessment & Plan Note (Signed)
Depression - sounds like you are getting to a place where help is needed Plan Refer to [facility]  - call [phone] [name] - for an appointment with one of our counselors  Will hold off on any more medication for the time being.

## 2012-10-12 NOTE — Progress Notes (Signed)
Subjective:    Patient ID: [name] [name], female    DOB: 05-Apr-1941, 71 y.o.   MRN: [id number]  HPI [name] reports that she has been having epistaxis 3-4 times a week with spontaneous bleeding. She controls the bleeding with pressure over the bridge of her nose or tamponading with tissue. She does continue to use Steroid nasal spray -  Patenase. She does have a history of epistaxis as a child.  Depression - she reports feeling more depressed than ever. She is sleeping a lot more than usual, feels sad/blue, not suicidal. Feels helpless and hopeless. She has used PAXIL in the past. She has not had counseling in the past many years. She did go to [facility]. She has been hospitalized for psychiatric reasons 6 or 7 years ago.  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 Doristine Section, now retired.  Seen recently in pulmonary and started on z-pak and prednisone burst and taper.   PMH, FamHx and SocHx reviewed for any changes and relevance. \ Current Outpatient Prescriptions on File Prior to Visit  Medication Sig Dispense Refill  . ALPRAZolam (XANAX) 0.5 MG tablet Take 1 tablet (0.5 mg total) by mouth every 6 (six) hours as needed. For anxiety  60 tablet  5  . amLODipine (NORVASC) 5 MG tablet Take 1 tablet (5 mg total) by mouth daily.  30 tablet  11  . budesonide-formoterol (SYMBICORT) 160-4.5 MCG/ACT inhaler Inhale 2 puffs into the lungs 2 (two) times daily. Rinse mouth  1 Inhaler  11  . cycloSPORINE (RESTASIS) 0.05 % ophthalmic emulsion Place 1 drop into both eyes 2 (two) times daily. BMN  0.4 mL  3  . desloratadine (CLARINEX) 5 MG tablet Take 1 tablet (5 mg total) by mouth every evening. BMN  30 tablet  11  . EPINEPHrine (EPIPEN) 0.3 mg/0.3 mL DEVI Inject 0.3 mLs (0.3 mg total) into the muscle once as needed. Use as directed for severe allergic reaction  1 Device  11  . esomeprazole (NEXIUM) 40 MG capsule Take 1 capsule (40  mg total) by mouth daily before breakfast.  30 capsule  11  . hydrochlorothiazide (HYDRODIURIL) 25 MG tablet Take 1 tablet (25 mg total) by mouth daily.  30 tablet  11  . HYDROcodone-acetaminophen (VICODIN ES) 7.5-750 MG per tablet Take 1 tablet by mouth every 6 (six) hours as needed. For pain  90 tablet  3  . levalbuterol (XOPENEX HFA) 45 MCG/ACT inhaler Inhale 2 puffs into the lungs every 4 (four) hours as needed. Shortness of breath  1 Inhaler  11  . levalbuterol (XOPENEX) 0.63 MG/3ML nebulizer solution Take 3 mLs (0.63 mg total) by nebulization every 6 (six) hours. Dx: 493.90  360 mL  6  . levofloxacin (LEVAQUIN) 500 MG tablet Take 1 tablet (500 mg total) by mouth daily.  5 tablet  0  . olopatadine (PATANOL) 0.1 % ophthalmic solution Place 2 drops into both eyes 2 (two) times daily.  5 mL  2  . Olopatadine HCl (PATANASE) 0.6 % SOLN Place 2 drops (2 puffs total) into the nose daily.  30.5 g  5  . peg 3350 powder (MOVIPREP) SOLR Take 1 kit (100 g total) by mouth once.  1 kit  0  . predniSONE (DELTASONE) 10 MG tablet Take 4 tablets daily x 3 days, then 3 tabs daily x 3 days, then 2 tabs daily x 3 days, then 1 tab  daily x 3 days, then 1/2 tablet daily and continue.  30 tablet  0  . SINGULAIR 10 MG tablet Take 1 tablet (10 mg total) by mouth daily. BRAND NAME ONLY  90 tablet  3  . [DISCONTINUED] levalbuterol (XOPENEX HFA) 45 MCG/ACT inhaler Inhale 2 puffs into the lungs every 4 (four) hours as needed. Shortness of breath  1 Inhaler  11      Review of Systems System review is negative for any constitutional, cardiac, pulmonary, GI or neuro symptoms or complaints other than as described in the HPI.     Objective:   Physical Exam Filed Vitals:   10/12/12 1050  BP: 122/68  Pulse: 68  Temp: 97 F (36.1 C)  Resp: 12   Gen'l - WNWD AA woman in no acute distress Cor - RRR Pulm - coarse rhonchi without frank wheezing. Neuro - A&O x 3       Assessment & Plan:  1. Nosebleeds - first cause  may be the patenase spray.  Plan - stop all nasal sprays except for nasal saline.  For continued nosebleeds you should see Dr. Pollyann Kennedy

## 2012-10-12 NOTE — Patient Instructions (Addendum)
1. Nosebleeds - first cause may be the patenase spray.  Plan - stop all nasal sprays except for nasal saline.  For continued nosebleeds you should see Dr. Pollyann Kennedy  2. Skin rash - will have the PCC's get you an appointment with a dermatologist that accepts Medicare/Medicaid  3. Depression - sounds like you are getting to a place where help is needed Plan Refer to [facility]  - call [phone] [name] - for an appointment with one of our counselors  Will hold off on any more medication for the time being.  4. Will check labs: cholesterol, kidney function, blood sugar.

## 2012-10-13 ENCOUNTER — Other Ambulatory Visit: Payer: Self-pay | Admitting: Internal Medicine

## 2012-10-13 ENCOUNTER — Encounter (HOSPITAL_COMMUNITY)
Admission: RE | Admit: 2012-10-13 | Discharge: 2012-10-13 | Disposition: A | Discharge status: Home / Self Care | Payer: Self-pay | Source: Ambulatory Visit | Attending: Internal Medicine | Admitting: Internal Medicine

## 2012-10-13 DIAGNOSIS — K219 Gastro-esophageal reflux disease without esophagitis: Secondary | ICD-10-CM | POA: Insufficient documentation

## 2012-10-13 DIAGNOSIS — G4730 Sleep apnea, unspecified: Secondary | ICD-10-CM | POA: Insufficient documentation

## 2012-10-13 DIAGNOSIS — M810 Age-related osteoporosis without current pathological fracture: Secondary | ICD-10-CM | POA: Insufficient documentation

## 2012-10-13 DIAGNOSIS — J4489 Other specified chronic obstructive pulmonary disease: Secondary | ICD-10-CM | POA: Insufficient documentation

## 2012-10-13 DIAGNOSIS — Z5189 Encounter for other specified aftercare: Secondary | ICD-10-CM | POA: Insufficient documentation

## 2012-10-13 DIAGNOSIS — J449 Chronic obstructive pulmonary disease, unspecified: Secondary | ICD-10-CM | POA: Insufficient documentation

## 2012-10-13 MED ORDER — OMALIZUMAB 150 MG ~~LOC~~ SOLR
300.0000 mg | Freq: Once | SUBCUTANEOUS | Status: AC
  Administered 2012-10-13: 300 mg via SUBCUTANEOUS

## 2012-10-15 ENCOUNTER — Encounter (HOSPITAL_COMMUNITY)
Admission: RE | Admit: 2012-10-15 | Discharge: 2012-10-15 | Disposition: A | Discharge status: Home / Self Care | Payer: Self-pay | Source: Ambulatory Visit | Attending: Internal Medicine | Admitting: Internal Medicine

## 2012-10-20 ENCOUNTER — Encounter (HOSPITAL_COMMUNITY)
Admission: RE | Admit: 2012-10-20 | Discharge: 2012-10-20 | Disposition: A | Discharge status: Home / Self Care | Payer: Self-pay | Source: Ambulatory Visit | Attending: Internal Medicine | Admitting: Internal Medicine

## 2012-10-22 ENCOUNTER — Encounter (HOSPITAL_COMMUNITY)
Admission: RE | Admit: 2012-10-22 | Discharge: 2012-10-22 | Disposition: A | Discharge status: Home / Self Care | Payer: Self-pay | Source: Ambulatory Visit | Attending: Internal Medicine | Admitting: Internal Medicine

## 2012-10-27 ENCOUNTER — Encounter (HOSPITAL_COMMUNITY)
Admission: RE | Admit: 2012-10-27 | Discharge: 2012-10-27 | Disposition: A | Discharge status: Home / Self Care | Payer: Self-pay | Source: Ambulatory Visit | Attending: Internal Medicine | Admitting: Internal Medicine

## 2012-10-29 ENCOUNTER — Encounter (HOSPITAL_COMMUNITY): Payer: Self-pay

## 2012-11-03 ENCOUNTER — Encounter (HOSPITAL_COMMUNITY): Payer: Self-pay

## 2012-11-05 ENCOUNTER — Encounter (HOSPITAL_COMMUNITY): Payer: Self-pay

## 2012-11-09 ENCOUNTER — Other Ambulatory Visit: Payer: Self-pay | Admitting: Internal Medicine

## 2012-11-09 ENCOUNTER — Ambulatory Visit (INDEPENDENT_AMBULATORY_CARE_PROVIDER_SITE_OTHER): Payer: Medicare Other

## 2012-11-09 ENCOUNTER — Other Ambulatory Visit (INDEPENDENT_AMBULATORY_CARE_PROVIDER_SITE_OTHER): Payer: Medicare Other

## 2012-11-09 DIAGNOSIS — R739 Hyperglycemia, unspecified: Secondary | ICD-10-CM

## 2012-11-09 DIAGNOSIS — J45909 Unspecified asthma, uncomplicated: Secondary | ICD-10-CM

## 2012-11-09 DIAGNOSIS — R7309 Other abnormal glucose: Secondary | ICD-10-CM

## 2012-11-09 DIAGNOSIS — N182 Chronic kidney disease, stage 2 (mild): Secondary | ICD-10-CM | POA: Diagnosis not present

## 2012-11-09 DIAGNOSIS — E785 Hyperlipidemia, unspecified: Secondary | ICD-10-CM

## 2012-11-09 DIAGNOSIS — K219 Gastro-esophageal reflux disease without esophagitis: Secondary | ICD-10-CM

## 2012-11-09 DIAGNOSIS — I10 Essential (primary) hypertension: Secondary | ICD-10-CM

## 2012-11-09 LAB — COMPREHENSIVE METABOLIC PANEL
Albumin: 3.5 g/dL (ref 3.5–5.2)
BUN: 16 mg/dL (ref 6–23)
Calcium: 9.2 mg/dL (ref 8.4–10.5)
Chloride: 107 mEq/L (ref 96–112)
Glucose, Bld: 96 mg/dL (ref 70–99)
Potassium: 4.1 mEq/L (ref 3.5–5.1)
Sodium: 141 mEq/L (ref 135–145)
Total Protein: 7.8 g/dL (ref 6.0–8.3)

## 2012-11-09 LAB — HEMOGLOBIN A1C: Hgb A1c MFr Bld: 6.7 % — ABNORMAL HIGH (ref 4.6–6.5)

## 2012-11-10 ENCOUNTER — Other Ambulatory Visit: Payer: Self-pay | Admitting: *Deleted

## 2012-11-10 ENCOUNTER — Encounter (HOSPITAL_COMMUNITY): Payer: Self-pay

## 2012-11-10 DIAGNOSIS — J45909 Unspecified asthma, uncomplicated: Secondary | ICD-10-CM | POA: Diagnosis not present

## 2012-11-10 LAB — CBC WITH DIFFERENTIAL/PLATELET
Basophils Absolute: 0 10*3/uL (ref 0.0–0.1)
Basophils Relative: 0.2 % (ref 0.0–3.0)
Eosinophils Absolute: 0.9 10*3/uL — ABNORMAL HIGH (ref 0.0–0.7)
HCT: 33.8 % — ABNORMAL LOW (ref 36.0–46.0)
Hemoglobin: 10.9 g/dL — ABNORMAL LOW (ref 12.0–15.0)
Lymphs Abs: 2.6 10*3/uL (ref 0.7–4.0)
MCHC: 32.4 g/dL (ref 30.0–36.0)
MCV: 80.2 fl (ref 78.0–100.0)
Monocytes Absolute: 0.6 10*3/uL (ref 0.1–1.0)
Neutro Abs: 4.7 10*3/uL (ref 1.4–7.7)
RBC: 4.21 Mil/uL (ref 3.87–5.11)
RDW: 16.1 % — ABNORMAL HIGH (ref 11.5–14.6)

## 2012-11-10 MED ORDER — OMALIZUMAB 150 MG ~~LOC~~ SOLR
300.0000 mg | Freq: Once | SUBCUTANEOUS | Status: AC
  Administered 2012-11-10: 300 mg via SUBCUTANEOUS

## 2012-11-10 MED ORDER — ESOMEPRAZOLE MAGNESIUM 40 MG PO CPDR: 40.0000 mg | DELAYED_RELEASE_CAPSULE | Freq: Every day | ORAL | Status: DC

## 2012-11-12 ENCOUNTER — Encounter (HOSPITAL_COMMUNITY)
Admission: RE | Admit: 2012-11-12 | Discharge: 2012-11-12 | Disposition: A | Discharge status: Home / Self Care | Payer: Self-pay | Source: Ambulatory Visit | Attending: Internal Medicine | Admitting: Internal Medicine

## 2012-11-12 DIAGNOSIS — Z5189 Encounter for other specified aftercare: Secondary | ICD-10-CM | POA: Insufficient documentation

## 2012-11-12 DIAGNOSIS — K219 Gastro-esophageal reflux disease without esophagitis: Secondary | ICD-10-CM | POA: Insufficient documentation

## 2012-11-12 DIAGNOSIS — J4489 Other specified chronic obstructive pulmonary disease: Secondary | ICD-10-CM | POA: Insufficient documentation

## 2012-11-12 DIAGNOSIS — J449 Chronic obstructive pulmonary disease, unspecified: Secondary | ICD-10-CM | POA: Insufficient documentation

## 2012-11-12 DIAGNOSIS — G4730 Sleep apnea, unspecified: Secondary | ICD-10-CM | POA: Insufficient documentation

## 2012-11-12 DIAGNOSIS — M810 Age-related osteoporosis without current pathological fracture: Secondary | ICD-10-CM | POA: Insufficient documentation

## 2012-11-16 ENCOUNTER — Encounter: Payer: Self-pay | Admitting: Internal Medicine

## 2012-11-17 ENCOUNTER — Encounter (HOSPITAL_COMMUNITY)
Admission: RE | Admit: 2012-11-17 | Discharge: 2012-11-17 | Disposition: A | Discharge status: Home / Self Care | Payer: Self-pay | Source: Ambulatory Visit | Attending: Internal Medicine | Admitting: Internal Medicine

## 2012-11-19 ENCOUNTER — Encounter (HOSPITAL_COMMUNITY)
Admission: RE | Admit: 2012-11-19 | Discharge: 2012-11-19 | Disposition: A | Discharge status: Home / Self Care | Payer: Self-pay | Source: Ambulatory Visit | Attending: Internal Medicine | Admitting: Internal Medicine

## 2012-11-24 ENCOUNTER — Encounter (HOSPITAL_COMMUNITY)
Admission: RE | Admit: 2012-11-24 | Discharge: 2012-11-24 | Disposition: A | Discharge status: Home / Self Care | Payer: Self-pay | Source: Ambulatory Visit | Attending: Internal Medicine | Admitting: Internal Medicine

## 2012-11-26 ENCOUNTER — Encounter (HOSPITAL_COMMUNITY)
Admission: RE | Admit: 2012-11-26 | Discharge: 2012-11-26 | Disposition: A | Discharge status: Home / Self Care | Payer: Self-pay | Source: Ambulatory Visit | Attending: Internal Medicine | Admitting: Internal Medicine

## 2012-11-27 ENCOUNTER — Ambulatory Visit: Payer: Medicare Other | Admitting: Pulmonary Disease

## 2012-12-01 ENCOUNTER — Encounter (HOSPITAL_COMMUNITY)
Admission: RE | Admit: 2012-12-01 | Discharge: 2012-12-01 | Disposition: A | Discharge status: Home / Self Care | Payer: Self-pay | Source: Ambulatory Visit | Attending: Internal Medicine | Admitting: Internal Medicine

## 2012-12-03 ENCOUNTER — Encounter (HOSPITAL_COMMUNITY): Payer: Self-pay

## 2012-12-07 ENCOUNTER — Ambulatory Visit: Payer: Medicare Other

## 2012-12-08 ENCOUNTER — Encounter (HOSPITAL_COMMUNITY)
Admission: RE | Admit: 2012-12-08 | Discharge: 2012-12-08 | Disposition: A | Discharge status: Home / Self Care | Payer: Self-pay | Source: Ambulatory Visit | Attending: Internal Medicine | Admitting: Internal Medicine

## 2012-12-08 NOTE — Progress Notes (Signed)
[name] [name] was noted to have a nose bleed today at Pulmonary rehab.  [name] [name] was given an ice bag which helped to stop the bleeding.  Exercise stopped.  Blood pressure 104/60 Sa02 97% on  Room air.  Heart rate 64. [name] [name] says she has had problems with nose bleed the past few months and Dr Debby Bud is aware. Will continue to monitor the patient throughout  the program.

## 2012-12-09 ENCOUNTER — Encounter: Payer: Self-pay | Admitting: Internal Medicine

## 2012-12-09 ENCOUNTER — Ambulatory Visit (INDEPENDENT_AMBULATORY_CARE_PROVIDER_SITE_OTHER): Payer: Medicare Other

## 2012-12-09 ENCOUNTER — Ambulatory Visit (INDEPENDENT_AMBULATORY_CARE_PROVIDER_SITE_OTHER): Payer: Medicare Other | Admitting: Internal Medicine

## 2012-12-09 ENCOUNTER — Telehealth: Payer: Self-pay | Admitting: Internal Medicine

## 2012-12-09 VITALS — BP 132/68 | HR 62 | Temp 97.9°F | Ht 63.5 in | Wt 190.0 lb

## 2012-12-09 DIAGNOSIS — J45909 Unspecified asthma, uncomplicated: Secondary | ICD-10-CM

## 2012-12-09 DIAGNOSIS — J45901 Unspecified asthma with (acute) exacerbation: Secondary | ICD-10-CM | POA: Diagnosis not present

## 2012-12-09 MED ORDER — PREDNISONE 10 MG PO TABS: ORAL_TABLET | ORAL | Status: DC

## 2012-12-09 MED ORDER — LEVOFLOXACIN 500 MG PO TABS: 500.0000 mg | ORAL_TABLET | Freq: Every day | ORAL | Status: DC

## 2012-12-09 MED ORDER — LEVALBUTEROL HCL 0.63 MG/3ML IN NEBU
0.6300 mg | INHALATION_SOLUTION | Freq: Once | RESPIRATORY_TRACT | Status: AC
  Administered 2012-12-09: 0.63 mg via RESPIRATORY_TRACT

## 2012-12-09 MED ORDER — METHYLPREDNISOLONE ACETATE 80 MG/ML IJ SUSP
80.0000 mg | Freq: Once | INTRAMUSCULAR | Status: AC
  Administered 2012-12-09: 80 mg via INTRAMUSCULAR

## 2012-12-09 NOTE — Addendum Note (Signed)
Addended by: Darrell Jewel on: 12/09/2012 11:08 AM   Modules accepted: Orders

## 2012-12-09 NOTE — Patient Instructions (Addendum)
#   Acute exacerbation asthma with some sinus infection possibly - You are in the midst of an acute attack of asthma - Please take Take prednisone 40mg once daily x 3 days, then 30mg once daily x 3 days, then 20mg once daily x 3 days, then prednisone 10mg once daily  x 3 days and then prednisone 5mg daily to continue at baseline -  take levaquin 500mg once daily  X 7 days (no allergy noted to levaquin) - Continue your regular asthma medications - If you get worse please go to the emergency room or call us - Please keep in mind at any time he noticed a decompensation you need to call us sooner rather than later -By hypertonic saline over-the-counter nasal spray called Neil med sinus spray. Note it has to be hypertonic variety  #Weight management and borderline diabetes - Follow low glycemic diet plan as advised. This will help you lose weight as well    Followup  - 3 months or sooner if needed   

## 2012-12-09 NOTE — Telephone Encounter (Signed)
I spoke with pt. She stated her pharmacy does not have levaquin in stock she is requesting an alternative. Please advise MR thanks  Allergies  Allergen Reactions  . 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 Latex Anaphylaxis  . Diclofenac Sodium     REACTION: Hives  . Doxycycline Nausea And Vomiting  . Penicillins     Tongue swelling  . Sulfonamide Derivatives     Tongue swelling   . Tiotropium Bromide Monohydrate     Tongue/mouth itching Dysuria   . Albuterol Anxiety    Switched to xopenex

## 2012-12-09 NOTE — Assessment & Plan Note (Signed)
#   Acute exacerbation asthma with some sinus infection possibly - You are in the midst of an acute attack of asthma - Please take Take prednisone 40mg  once daily x 3 days, then 30mg  once daily x 3 days, then 20mg  once daily x 3 days, then prednisone 10mg  once daily  x 3 days and then prednisone 5mg  daily to continue at baseline -  take levaquin 500mg  once daily  X 7 days (no allergy noted to levaquin) - Continue your regular asthma medications - If you get worse please go to the emergency room or call us - Please keep in mind at any time he noticed a decompensation you need to call us sooner rather than later -By hypertonic saline over-the-counter nasal spray called Lloyd Huger med sinus spray. Note it has to be hypertonic variety  #Weight management and borderline diabetes - Follow low glycemic diet plan as advised. This will help you lose weight as well    Followup  - 3 months or sooner if needed

## 2012-12-09 NOTE — Telephone Encounter (Signed)
Spoke with patient-states she can have Levaquin called to [facility] [address]. I called and checked with [facility]-they have in stock; gave verbal for abx and cancelled original Rx at [facility] [address]. Pt is aware and I have updated her medlist/chart to reflect this change.

## 2012-12-09 NOTE — Progress Notes (Signed)
Subjective:    Patient ID: [name] [name], female    DOB: Aug 15, 1941, 72 y.o.   MRN: [id number]  HPI TERRYE [name] is a 72 y.o. female with severe asthma, bronchiectasis, allergic rhinitis.  She is followed by [name] [name].  She is on chronic prednisone and xolair.  She is normally on 2 to 5 mg prednisone per day.  She had to increase this to 10 mg per day since July 4.  She has increased cough and yellow sputum.  She has more sinus pressure, and post nasal drip.  She had temperature up to 100F 2 days ago.  She has ear pain, sore throat, and increased wheeze.  Her chest gets sore from coughing.  She is now using her nebulizer 4 to 5 times per day.    She was seen by [name] [name] for thermoplasty, and advised to follow up again in August to re-assess status.  Zithromax 250 mg pill>>2 pills on first day, then 1 pill daily for next four days  Prednisone 10 mg pill>>4 pills for 2 days, 3 pills for 2 days, 2 pills for 2 days, 1 pill for 2 days, then resume prednisone 5 mg per day  Call if no better after few days  Call next week to update status and get letter to resume pulmonary rehab  Follow up in August 2013 with Dr. Meryle Ready 06/15/2012  Followuo for above  Since last visit has seen Dr Carmie [name]. Per his note and her: skeptical that she is a good candidate for thermoplasty. He wil be seeing her again. She says she has been AE-asthma free since seeing me last but she did see Dr Craige [name] and was Rx for ae-asthma. She says she is compliant with medications. She has no change in baseline asthma status. She is having isses with sleep apnea; last eval several years ago and not using her cpap. She wants to know if she is diabetic; several family members with DM  Past, Family, Social reviewed: no change since last visit     #Asthma  - stable today  - continue medications including xolair as scheduled  - follow with Dr Carmie [name]  - regroup in 3 months  #Sleep apnea  - refer to Dr Craige [name]  #General  health  - check ha1c blood test today - 3 months average sugar  #Followu  3 months with spirometry test   OV 09/14/2012 Last 3 days increased chest congestion. Today, yellow sputum. Feels she is in [place]. She is unable to keep appt with DR [facility] for thermoplasty eval due to logistics. [place]ewver, overal trend in asthma is better since starting xolair. No other new issues.  She maintains she is compliant with her mdi  Past, Family, Social reviewed: no change since last visit  You have an asthma attack brewing though there is not much wheeze on exam  take levaquin 500mg  once daily X 5 days  Please take Take prednisone 40mg  once daily x 3 days, then 30mg  once daily x 3 days, then 20mg  once daily x 3 days, then prednisone 10mg  once daily x 3 days and then daily 5mg   Follow with Dr Carmie [name] when you can  Return in 6 months   OV 12/09/2012  [name] presents for followup of asthma. She reports acute decompensation at this routine followup. For the past 3 days she has had increasing chest congestion, wheezing, sputum volume increase, change in color of sputum to green. Symptoms are not associated with fever but  are associated with constant nocturnal awakening and albuterol rescue use at least 3-4 times daily which is more than baseline for her. She describes her symptoms as moderate to severe but not severe enough to require admission. There is no associated pedal edema or chest pain. Symptoms are consistent with an acute exacerbation of asthma.  In terms of medication compliance she reports compliance with her inhalers. She is due for a Xolair shot today.  Past, Family, Social reviewed: Her hemoglobin A1c recently is 6.7 a she has been informed that she is borderline diabetes. Is been diagnosed with chronic renal insufficiency with a creatinine of 1.3 mg percent. She is asking me for advice on diet modifications. She scared of permanent renal failure and dialysis  Review of Systems    Constitutional: Negative for fever and unexpected weight change.  HENT: Negative for ear pain, nosebleeds, congestion, sore throat, rhinorrhea, sneezing, trouble swallowing, dental problem, postnasal drip and sinus pressure.   Eyes: Negative for redness and itching.  Respiratory: Positive for cough, chest tightness, shortness of breath and wheezing.   Cardiovascular: Negative for palpitations and leg swelling.  Gastrointestinal: Negative for nausea and vomiting.  Genitourinary: Negative for dysuria.  Musculoskeletal: Negative for joint swelling.  Skin: Negative for rash.  Neurological: Negative for headaches.  Hematological: Does not bruise/bleed easily.  Psychiatric/Behavioral: Negative for dysphoric mood. The patient is not nervous/anxious.        Objective:   Physical Exam  Vitals reviewed. Constitutional: She is oriented to person, place, and time. She appears well-developed and well-nourished. No distress.       Obese  HENT:  Head: Normocephalic and atraumatic.  Right Ear: External ear normal.  Left Ear: External ear normal.  Mouth/Throat: Oropharynx is clear and moist. No oropharyngeal exudate.       Nasal congestion with crust present  Eyes: Conjunctivae normal and EOM are normal. Pupils are equal, round, and reactive to light. Right eye exhibits no discharge. Left eye exhibits no discharge. No scleral icterus.  Neck: Normal range of motion. Neck supple. No JVD present. No tracheal deviation present. No thyromegaly present.  Cardiovascular: Normal rate, regular rhythm, normal heart sounds and intact distal pulses.  Exam reveals no gallop and no friction rub.   No murmur heard. Pulmonary/Chest: Effort normal. No respiratory distress. She has wheezes. She has no rales. She exhibits no tenderness.       Significant bilateral wheezing but no accessory muscle use and able to complete full sentences   Abdominal: Soft. Bowel sounds are normal. She exhibits no distension and no  mass. There is no tenderness. There is no rebound and no guarding.  Musculoskeletal: Normal range of motion. She exhibits no edema and no tenderness.  Lymphadenopathy:    She has no cervical adenopathy.  Neurological: She is alert and oriented to person, place, and time. She has normal reflexes. No cranial nerve deficit. She exhibits normal muscle tone. Coordination normal.  Skin: Skin is warm and dry. No rash noted. She is not diaphoretic. No erythema. No pallor.  Psychiatric: She has a normal mood and affect. Her behavior is normal. Judgment and thought content normal.   General - Obese HEENT - clear mild tender over maxillary sinus Lt > Rt, no oral exudate, no LAN, TM clear Cardiac - s1s2 regular Chest - prolonged exhalation, b/l expiratory wheeze Abd - soft, non tender Ext - no edema Neuro - normal strength Psych - normal mood, behavior Skin - changes of eczema around her ears b/l  Assessment & Plan:

## 2012-12-09 NOTE — Telephone Encounter (Signed)
She is allergic to a lot of antibiotics. His there are another pharmacy that will have it and can she go to another pharmacy. If not she can do Avelox 400 mg once a day x5 days

## 2012-12-09 NOTE — Assessment & Plan Note (Signed)
I have advised low glycemic that plan and given diet sheet for this

## 2012-12-10 ENCOUNTER — Encounter (HOSPITAL_COMMUNITY): Payer: Self-pay

## 2012-12-14 ENCOUNTER — Other Ambulatory Visit: Payer: Self-pay | Admitting: Internal Medicine

## 2012-12-14 MED ORDER — OMALIZUMAB 150 MG ~~LOC~~ SOLR
300.0000 mg | Freq: Once | SUBCUTANEOUS | Status: AC
  Administered 2012-12-14: 300 mg via SUBCUTANEOUS

## 2012-12-15 ENCOUNTER — Encounter (HOSPITAL_COMMUNITY): Payer: Self-pay

## 2012-12-15 ENCOUNTER — Other Ambulatory Visit: Payer: Self-pay | Admitting: Internal Medicine

## 2012-12-15 DIAGNOSIS — J4489 Other specified chronic obstructive pulmonary disease: Secondary | ICD-10-CM | POA: Insufficient documentation

## 2012-12-15 DIAGNOSIS — Z5189 Encounter for other specified aftercare: Secondary | ICD-10-CM | POA: Insufficient documentation

## 2012-12-15 DIAGNOSIS — J449 Chronic obstructive pulmonary disease, unspecified: Secondary | ICD-10-CM | POA: Insufficient documentation

## 2012-12-15 DIAGNOSIS — M810 Age-related osteoporosis without current pathological fracture: Secondary | ICD-10-CM | POA: Insufficient documentation

## 2012-12-15 DIAGNOSIS — K219 Gastro-esophageal reflux disease without esophagitis: Secondary | ICD-10-CM | POA: Insufficient documentation

## 2012-12-15 DIAGNOSIS — G4730 Sleep apnea, unspecified: Secondary | ICD-10-CM | POA: Insufficient documentation

## 2012-12-15 MED ORDER — LEVALBUTEROL TARTRATE 45 MCG/ACT IN AERO: 2.0000 | INHALATION_SPRAY | RESPIRATORY_TRACT | Status: DC | PRN

## 2012-12-15 MED ORDER — AMLODIPINE BESYLATE 5 MG PO TABS: 5.0000 mg | ORAL_TABLET | Freq: Every day | ORAL | Status: DC

## 2012-12-16 ENCOUNTER — Other Ambulatory Visit: Payer: Self-pay | Admitting: Internal Medicine

## 2012-12-17 ENCOUNTER — Other Ambulatory Visit: Payer: Self-pay | Admitting: *Deleted

## 2012-12-17 ENCOUNTER — Encounter (HOSPITAL_COMMUNITY): Payer: Self-pay

## 2012-12-17 MED ORDER — HYDROCODONE-ACETAMINOPHEN 7.5-325 MG PO TABS: 1.0000 | ORAL_TABLET | Freq: Four times a day (QID) | ORAL | Status: DC | PRN

## 2012-12-22 ENCOUNTER — Encounter (HOSPITAL_COMMUNITY)
Admission: RE | Admit: 2012-12-22 | Discharge: 2012-12-22 | Disposition: A | Discharge status: Home / Self Care | Payer: Self-pay | Source: Ambulatory Visit | Attending: Pulmonary Disease | Admitting: Pulmonary Disease

## 2012-12-23 ENCOUNTER — Ambulatory Visit: Payer: Medicare Other | Admitting: Pulmonary Disease

## 2012-12-24 ENCOUNTER — Encounter (HOSPITAL_COMMUNITY): Payer: Self-pay

## 2012-12-29 ENCOUNTER — Encounter (HOSPITAL_COMMUNITY): Payer: Self-pay

## 2012-12-31 ENCOUNTER — Encounter (HOSPITAL_COMMUNITY)
Admission: RE | Admit: 2012-12-31 | Discharge: 2012-12-31 | Disposition: A | Discharge status: Home / Self Care | Payer: Self-pay | Source: Ambulatory Visit | Attending: Pulmonary Disease | Admitting: Pulmonary Disease

## 2013-01-05 ENCOUNTER — Encounter (HOSPITAL_COMMUNITY)
Admission: RE | Admit: 2013-01-05 | Discharge: 2013-01-05 | Disposition: A | Discharge status: Home / Self Care | Payer: Self-pay | Source: Ambulatory Visit | Attending: Pulmonary Disease | Admitting: Pulmonary Disease

## 2013-01-07 ENCOUNTER — Ambulatory Visit: Payer: Medicare Other

## 2013-01-07 ENCOUNTER — Encounter (HOSPITAL_COMMUNITY): Payer: Medicare Other | Attending: Pulmonary Disease

## 2013-01-07 DIAGNOSIS — G4730 Sleep apnea, unspecified: Secondary | ICD-10-CM | POA: Insufficient documentation

## 2013-01-07 DIAGNOSIS — M810 Age-related osteoporosis without current pathological fracture: Secondary | ICD-10-CM | POA: Insufficient documentation

## 2013-01-07 DIAGNOSIS — Z5189 Encounter for other specified aftercare: Secondary | ICD-10-CM | POA: Insufficient documentation

## 2013-01-07 DIAGNOSIS — J4489 Other specified chronic obstructive pulmonary disease: Secondary | ICD-10-CM | POA: Insufficient documentation

## 2013-01-07 DIAGNOSIS — K219 Gastro-esophageal reflux disease without esophagitis: Secondary | ICD-10-CM | POA: Insufficient documentation

## 2013-01-11 ENCOUNTER — Other Ambulatory Visit: Payer: Self-pay | Admitting: Internal Medicine

## 2013-01-12 ENCOUNTER — Encounter (HOSPITAL_COMMUNITY): Payer: Self-pay

## 2013-01-12 DIAGNOSIS — G4730 Sleep apnea, unspecified: Secondary | ICD-10-CM | POA: Insufficient documentation

## 2013-01-12 DIAGNOSIS — K219 Gastro-esophageal reflux disease without esophagitis: Secondary | ICD-10-CM | POA: Insufficient documentation

## 2013-01-12 DIAGNOSIS — Z5189 Encounter for other specified aftercare: Secondary | ICD-10-CM | POA: Insufficient documentation

## 2013-01-12 DIAGNOSIS — J4489 Other specified chronic obstructive pulmonary disease: Secondary | ICD-10-CM | POA: Insufficient documentation

## 2013-01-12 DIAGNOSIS — J449 Chronic obstructive pulmonary disease, unspecified: Secondary | ICD-10-CM | POA: Insufficient documentation

## 2013-01-12 DIAGNOSIS — M810 Age-related osteoporosis without current pathological fracture: Secondary | ICD-10-CM | POA: Insufficient documentation

## 2013-01-13 ENCOUNTER — Ambulatory Visit (INDEPENDENT_AMBULATORY_CARE_PROVIDER_SITE_OTHER): Payer: Medicare Other | Admitting: Pulmonary Disease

## 2013-01-13 ENCOUNTER — Ambulatory Visit (INDEPENDENT_AMBULATORY_CARE_PROVIDER_SITE_OTHER): Payer: Medicare Other

## 2013-01-13 ENCOUNTER — Encounter: Payer: Self-pay | Admitting: Pulmonary Disease

## 2013-01-13 VITALS — BP 120/74 | HR 60 | Temp 97.5°F | Ht 63.5 in | Wt 184.6 lb

## 2013-01-13 DIAGNOSIS — J45901 Unspecified asthma with (acute) exacerbation: Secondary | ICD-10-CM | POA: Diagnosis not present

## 2013-01-13 DIAGNOSIS — J309 Allergic rhinitis, unspecified: Secondary | ICD-10-CM

## 2013-01-13 DIAGNOSIS — G4733 Obstructive sleep apnea (adult) (pediatric): Secondary | ICD-10-CM | POA: Diagnosis not present

## 2013-01-13 DIAGNOSIS — J45909 Unspecified asthma, uncomplicated: Secondary | ICD-10-CM

## 2013-01-13 MED ORDER — FLUTICASONE PROPIONATE 50 MCG/ACT NA SUSP: 2.0000 | Freq: Every day | NASAL | Status: DC

## 2013-01-13 MED ORDER — OMALIZUMAB 150 MG ~~LOC~~ SOLR
300.0000 mg | Freq: Once | SUBCUTANEOUS | Status: AC
  Administered 2013-01-13: 300 mg via SUBCUTANEOUS

## 2013-01-13 NOTE — Progress Notes (Signed)
Chief Complaint  Patient presents with  . Sleep Apnea    Pt states she wears her CPAP couple times a week or when she is not having sinus problems/nose bleeds.     History of Present Illness: [name] [name] is a 72 y.o. female with OSA.  She has trouble with sinus congestion and nose bleeds.  As a result she has trouble tolerating CPAP.  She sleeps better when she is using CPAP.  She has been seen by Dr. Pollyann Kennedy before, and was told she may need sinus surgery.  She was using nasal steroids, but not recently.  She has more cough, wheeze, and chest congestion.   TESTS: PSG 12/05/04 >> RDI 17, SpO2 82%  [name] [name]  has a past medical history of Sleep apnea; ALLERGIC RHINITIS; Colon polyp, hyperplastic (02/2003); Chronic rhinosinusitis; Hyperlipidemia; Acute bronchitis; Right hip pain; Left knee pain; Osteoporosis; GERD (gastroesophageal reflux disease); Hiatal hernia; Helicobacter pylori gastritis (02/2009); Gastroparesis (2010); Allergic rhinitis; Chronic obstructive asthma; Allergic asthma; Bronchiectasis; Complication of anesthesia (1969); Hypertension; Angina; COPD (chronic obstructive pulmonary disease); Recurrent upper respiratory infection (URI); Pneumonia (10/21/11); Shortness of breath; Headache; Anxiety; Panic attacks; and Arthritis.  [name] [name]  has past surgical history that includes skin grafting (1969); Colonoscopy; Polypectomy; Appendectomy (1960); Cataract extraction w/ intraocular lens  implant, bilateral (2012); Tubal ligation (1968); and Tonsillectomy (1960).  Prior to Admission medications   Medication Sig Start Date End Date Taking? Authorizing Provider  ALPRAZolam Prudy Feeler) 0.5 MG tablet Take 1 tablet (0.5 mg total) by mouth every 6 (six) hours as needed. For anxiety 07/09/12  Yes Jacques Navy, MD  amLODipine (NORVASC) 5 MG tablet Take 1 tablet (5 mg total) by mouth daily. 12/15/12  Yes Jacques Navy, MD  budesonide-formoterol (SYMBICORT) 160-4.5 MCG/ACT inhaler Inhale  2 puffs into the lungs 2 (two) times daily. Rinse mouth 10/12/12  Yes Jacques Navy, MD  desloratadine (CLARINEX) 5 MG tablet Take 1 tablet (5 mg total) by mouth every evening. BMN 10/12/12  Yes Jacques Navy, MD  EPINEPHrine (EPIPEN) 0.3 mg/0.3 mL DEVI Inject 0.3 mLs (0.3 mg total) into the muscle once as needed. Use as directed for severe allergic reaction 10/12/12  Yes Jacques Navy, MD  esomeprazole (NEXIUM) 40 MG capsule Take 1 capsule (40 mg total) by mouth daily before breakfast. 11/10/12  Yes Jacques Navy, MD  hydrochlorothiazide (HYDRODIURIL) 25 MG tablet Take 1 tablet (25 mg total) by mouth daily. 10/12/12  Yes Jacques Navy, MD  HYDROcodone-acetaminophen (NORCO) 7.5-325 MG per tablet Take 1 tablet by mouth every 6 (six) hours as needed for pain. 12/17/12  Yes Jacques Navy, MD  levalbuterol [facility] HFA) 45 MCG/ACT inhaler Inhale 2 puffs into the lungs every 4 (four) hours as needed. Shortness of breath 12/15/12 01/25/15 Yes Jacques Navy, MD  levalbuterol Pauline Aus) 0.63 MG/3ML nebulizer solution Take 3 mLs (0.63 mg total) by nebulization every 6 (six) hours. Dx: 493.90 09/04/12 09/04/13 Yes Kalman Shan, MD  PATANOL 0.1 % ophthalmic solution PLACE 2 DROPS INTO BOTH EYES 2 (TWO) TIMES DAILY. 10/13/12  Yes Jacques Navy, MD  peg 3350 powder (MOVIPREP) SOLR Take 1 kit (100 g total) by mouth once. 02/12/12  Yes Meryl Dare, MD  predniSONE (DELTASONE) 10 MG tablet Take 5 mg by mouth daily. 09/14/12  Yes Kalman Shan, MD  RESTASIS 0.05 % ophthalmic emulsion PLACE 1 DROP INTO BOTH EYES 2 (TWO) TIMES DAILY. BMN 01/11/13  Yes Jacques Navy, MD  SINGULAIR 10 MG tablet Take 1 tablet (10 mg total) by mouth daily. BRAND NAME ONLY 10/12/12  Yes Jacques Navy, MD    Allergies  Allergen Reactions  . 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 Latex Anaphylaxis  . Diclofenac Sodium     REACTION: Hives  . Doxycycline Nausea And Vomiting  .  Penicillins     Tongue swelling  . Sulfonamide Derivatives     Tongue swelling   . Tiotropium Bromide Monohydrate     Tongue/mouth itching Dysuria   . Albuterol Anxiety    Switched to xopenex     Physical Exam:  General - No distress ENT - No sinus tenderness, clear nasal discharge, no oral exudate, no LAN Cardiac - s1s2 regular, no murmur Chest - faint b/l expiratory wheeze Back - No focal tenderness Abd - Soft, non-tender Ext - No edema Neuro - Normal strength Skin - No rashes Psych - normal mood, and behavior   Assessment/Plan:  Coralyn Helling, MD [place] [facility]Sleep Pager:  [phone] 01/13/2013, 10:22 AM

## 2013-01-13 NOTE — Assessment & Plan Note (Signed)
Have advised her to resume nasal irrigation and will send script for flonase.  Demonstrate proper use of nose sprays.  If she in not improved, then she may need f/u with Dr. Pollyann Kennedy with ENT.

## 2013-01-13 NOTE — Assessment & Plan Note (Signed)
She has difficulty tolerating CPAP due to sinus problems.  Hopefully better sinus control with improve her use of CPAP.

## 2013-01-13 NOTE — Patient Instructions (Signed)
Saline nasal spray (nasal irrigation) daily Flonase two sprays each nostril daily after using saline nasal spray Prednisone 10 mg pills >> 2 pills today, 1.5 pills tomorrow, then resume half pill daily Follow up in 6 months

## 2013-01-13 NOTE — Assessment & Plan Note (Signed)
She has mild asthma exacerbation.  Advised her to increase prednisone for next few days.  Advised she can use her xopenex more often when her breathing is worse.  She is to f/u with Dr. Marchelle Gearing.

## 2013-01-14 ENCOUNTER — Encounter (HOSPITAL_COMMUNITY)
Admission: RE | Admit: 2013-01-14 | Discharge: 2013-01-14 | Disposition: A | Discharge status: Home / Self Care | Payer: Self-pay | Source: Ambulatory Visit | Attending: Pulmonary Disease | Admitting: Pulmonary Disease

## 2013-01-19 ENCOUNTER — Encounter (HOSPITAL_COMMUNITY): Payer: Self-pay

## 2013-01-21 ENCOUNTER — Encounter (HOSPITAL_COMMUNITY)
Admission: RE | Admit: 2013-01-21 | Discharge: 2013-01-21 | Disposition: A | Discharge status: Home / Self Care | Payer: Self-pay | Source: Ambulatory Visit | Attending: Pulmonary Disease | Admitting: Pulmonary Disease

## 2013-01-26 ENCOUNTER — Encounter (HOSPITAL_COMMUNITY): Payer: Self-pay

## 2013-01-28 ENCOUNTER — Encounter (HOSPITAL_COMMUNITY)
Admission: RE | Admit: 2013-01-28 | Discharge: 2013-01-28 | Disposition: A | Discharge status: Home / Self Care | Payer: Self-pay | Source: Ambulatory Visit | Attending: Pulmonary Disease | Admitting: Pulmonary Disease

## 2013-02-02 ENCOUNTER — Encounter (HOSPITAL_COMMUNITY): Payer: Self-pay

## 2013-02-04 ENCOUNTER — Encounter (HOSPITAL_COMMUNITY): Payer: Self-pay

## 2013-02-09 ENCOUNTER — Encounter (HOSPITAL_COMMUNITY)
Admission: RE | Admit: 2013-02-09 | Discharge: 2013-02-09 | Disposition: A | Discharge status: Home / Self Care | Payer: Self-pay | Source: Ambulatory Visit | Attending: Pulmonary Disease | Admitting: Pulmonary Disease

## 2013-02-09 DIAGNOSIS — G4730 Sleep apnea, unspecified: Secondary | ICD-10-CM | POA: Insufficient documentation

## 2013-02-09 DIAGNOSIS — J4489 Other specified chronic obstructive pulmonary disease: Secondary | ICD-10-CM | POA: Insufficient documentation

## 2013-02-09 DIAGNOSIS — J449 Chronic obstructive pulmonary disease, unspecified: Secondary | ICD-10-CM | POA: Insufficient documentation

## 2013-02-09 DIAGNOSIS — K219 Gastro-esophageal reflux disease without esophagitis: Secondary | ICD-10-CM | POA: Insufficient documentation

## 2013-02-09 DIAGNOSIS — Z5189 Encounter for other specified aftercare: Secondary | ICD-10-CM | POA: Insufficient documentation

## 2013-02-09 DIAGNOSIS — M810 Age-related osteoporosis without current pathological fracture: Secondary | ICD-10-CM | POA: Insufficient documentation

## 2013-02-10 ENCOUNTER — Ambulatory Visit: Payer: Medicare Other

## 2013-02-11 ENCOUNTER — Encounter (HOSPITAL_COMMUNITY)
Admission: RE | Admit: 2013-02-11 | Discharge: 2013-02-11 | Disposition: A | Discharge status: Home / Self Care | Payer: Self-pay | Source: Ambulatory Visit | Attending: Pulmonary Disease | Admitting: Pulmonary Disease

## 2013-02-11 ENCOUNTER — Ambulatory Visit: Payer: Medicare Other | Admitting: Internal Medicine

## 2013-02-12 ENCOUNTER — Other Ambulatory Visit: Payer: Self-pay | Admitting: Internal Medicine

## 2013-02-12 NOTE — Telephone Encounter (Signed)
Alprazolam called to pharmacy  

## 2013-02-16 ENCOUNTER — Encounter (HOSPITAL_COMMUNITY): Payer: Self-pay

## 2013-02-17 ENCOUNTER — Ambulatory Visit: Payer: Medicare Other | Admitting: Internal Medicine

## 2013-02-18 ENCOUNTER — Encounter (HOSPITAL_COMMUNITY): Payer: Self-pay

## 2013-02-19 ENCOUNTER — Encounter: Payer: Self-pay | Admitting: Gastroenterology

## 2013-02-19 ENCOUNTER — Encounter: Payer: Self-pay | Admitting: *Deleted

## 2013-02-23 ENCOUNTER — Telehealth: Payer: Self-pay

## 2013-02-23 ENCOUNTER — Encounter (HOSPITAL_COMMUNITY)
Admission: RE | Admit: 2013-02-23 | Discharge: 2013-02-23 | Disposition: A | Discharge status: Home / Self Care | Payer: Self-pay | Source: Ambulatory Visit | Attending: Pulmonary Disease | Admitting: Pulmonary Disease

## 2013-02-23 NOTE — Telephone Encounter (Signed)
Received a fax that prior authorization for Singulair has been approved. This information was faxed to the pharmacy.

## 2013-02-24 ENCOUNTER — Telehealth: Payer: Self-pay | Admitting: Internal Medicine

## 2013-02-24 ENCOUNTER — Ambulatory Visit (INDEPENDENT_AMBULATORY_CARE_PROVIDER_SITE_OTHER): Payer: Medicare Other | Admitting: Internal Medicine

## 2013-02-24 ENCOUNTER — Other Ambulatory Visit (INDEPENDENT_AMBULATORY_CARE_PROVIDER_SITE_OTHER): Payer: Medicare Other

## 2013-02-24 ENCOUNTER — Encounter: Payer: Self-pay | Admitting: Internal Medicine

## 2013-02-24 VITALS — BP 152/90 | HR 74 | Temp 98.8°F | Wt 181.2 lb

## 2013-02-24 DIAGNOSIS — E785 Hyperlipidemia, unspecified: Secondary | ICD-10-CM

## 2013-02-24 DIAGNOSIS — R739 Hyperglycemia, unspecified: Secondary | ICD-10-CM

## 2013-02-24 DIAGNOSIS — I10 Essential (primary) hypertension: Secondary | ICD-10-CM | POA: Diagnosis not present

## 2013-02-24 DIAGNOSIS — R7309 Other abnormal glucose: Secondary | ICD-10-CM

## 2013-02-24 LAB — BASIC METABOLIC PANEL WITH GFR
BUN: 18 mg/dL (ref 6–23)
CO2: 25 meq/L (ref 19–32)
Calcium: 9.3 mg/dL (ref 8.4–10.5)
Chloride: 105 meq/L (ref 96–112)
Creatinine, Ser: 1.4 mg/dL — ABNORMAL HIGH (ref 0.4–1.2)
GFR: 46.28 mL/min — ABNORMAL LOW
Glucose, Bld: 130 mg/dL — ABNORMAL HIGH (ref 70–99)
Potassium: 4.1 meq/L (ref 3.5–5.1)
Sodium: 137 meq/L (ref 135–145)

## 2013-02-24 LAB — HEMOGLOBIN A1C: Hgb A1c MFr Bld: 6.5 % (ref 4.6–6.5)

## 2013-02-24 LAB — LIPID PANEL
Cholesterol: 203 mg/dL — ABNORMAL HIGH (ref 0–200)
HDL: 54.5 mg/dL
Total CHOL/HDL Ratio: 4
Triglycerides: 92 mg/dL (ref 0.0–149.0)
VLDL: 18.4 mg/dL (ref 0.0–40.0)

## 2013-02-24 MED ORDER — HYDROCODONE-ACETAMINOPHEN 7.5-325 MG PO TABS: 1.0000 | ORAL_TABLET | Freq: Four times a day (QID) | ORAL | Status: DC | PRN

## 2013-02-24 NOTE — Telephone Encounter (Signed)
Samples will be given to patient.

## 2013-02-24 NOTE — Telephone Encounter (Signed)
[name] [name] has spoke to the patient looks like patient needed a prior approval for [employer]

## 2013-02-24 NOTE — Assessment & Plan Note (Signed)
subop controlled - attributes to "stress" (recent bill for Xolair shots) The current medical regimen is effective;  continue present plan and medications.

## 2013-02-24 NOTE — Assessment & Plan Note (Signed)
Likely steroid induced (pred rx'd for pulm dx) Has changed diet and worked on weight loss Recheck now, consider meds as needed Lab Results  Component Value Date   HGBA1C 6.7* 11/09/2012

## 2013-02-24 NOTE — Assessment & Plan Note (Signed)
Tried crestor 5mg 03/2011 - intolerable due to myalgia Goal LDL<100 per PCP Has been working on diet and exercise Recheck now, consider alt statin if needed

## 2013-02-24 NOTE — Patient Instructions (Signed)
It was good to see you today. We have reviewed your prior records including labs and tests today Test(s) ordered today. Your results will be released to MyChart (or called to you) after review, usually within 72hours after test completion. If any changes need to be made, you will be notified at that same time. Medications reviewed and updated, no changes at this time.

## 2013-02-24 NOTE — Telephone Encounter (Signed)
I will route this message to Bjorn Loser, so that way once she speaks to the patient she can document the conversation.

## 2013-02-24 NOTE — Progress Notes (Signed)
  Subjective:    Patient ID: [name] [name], female    DOB: August 22, 1941, 72 y.o.   MRN: [id number]  HPI  Here for follow up - abnormal labs 10/2012: a1c 6.7, LDL 170 Has been working on diet changes to control same Ready for recheck now  Past Medical History  Diagnosis Date  . Sleep apnea   . ALLERGIC RHINITIS   . Colon polyp, hyperplastic 02/2003  . Chronic rhinosinusitis   . Hyperlipidemia   . Acute bronchitis   . Right hip pain   . Left knee pain   . Osteoporosis   . GERD (gastroesophageal reflux disease)   . Hiatal hernia   . Helicobacter pylori gastritis 02/2009    partially treated  . Gastroparesis 2010  . Allergic rhinitis     h/o  . Chronic obstructive asthma     PFT 11/06/10 - FEV1 1.24/ 0.62; FEV1/FVC 0.56, TLC 0.78; DLCO 0.75  . Allergic asthma     h/o  . Bronchiectasis     h/o  . Complication of anesthesia 1969    "think they gave me too much; thought I wouldn't wake up"  . Hypertension   . Angina     "related to my difficulty breathing"  . COPD (chronic obstructive pulmonary disease)   . Recurrent upper respiratory infection (URI)   . Headache     "when I get asthma attacks"  . Anxiety   . Panic attacks     "only when I get to that point where I can't catch my breath"  . Arthritis     "in left foot; dx'd 12/2010"    Review of Systems  Constitutional: Negative for fever and fatigue.  Respiratory: Negative for cough.   Cardiovascular: Negative for chest pain and palpitations.  Neurological: Negative for weakness and headaches.       Objective:   Physical Exam BP 152/90  Pulse 74  Temp(Src) 98.8 F (37.1 C) (Oral)  Wt 181 lb 3.2 oz (82.192 kg)  BMI 31.59 kg/m2  SpO2 98% Wt Readings from Last 3 Encounters:  02/24/13 181 lb 3.2 oz (82.192 kg)  01/13/13 184 lb 9.6 oz (83.734 kg)  12/09/12 190 lb (86.183 kg)   Constitutional: She appears well-developed and well-nourished. No distress.   Neck: Normal range of motion. Neck supple. No JVD present. No  thyromegaly present.  Cardiovascular: Normal rate, regular rhythm and normal heart sounds.  No murmur heard. No BLE edema. Pulmonary/Chest: Effort normal and breath sounds diminished at bases. No respiratory distress. She has soft end exp wheezes.  Psychiatric: She has a normal mood and affect. Her behavior is normal. Judgment and thought content normal.   Lab Results  Component Value Date   WBC 8.8 11/09/2012   HGB 10.9* 11/09/2012   HCT 33.8* 11/09/2012   PLT 245.0 11/09/2012   GLUCOSE 96 11/09/2012   CHOL 221* 11/09/2012   TRIG 127.0 11/09/2012   HDL 41.70 11/09/2012   LDLDIRECT 173.3 11/09/2012   LDLCALC 128* 12/25/2010   ALT 19 11/09/2012   AST 26 11/09/2012   NA 141 11/09/2012   K 4.1 11/09/2012   CL 107 11/09/2012   CREATININE 1.3* 11/09/2012   BUN 16 11/09/2012   CO2 27 11/09/2012   TSH 0.92 03/27/2009   HGBA1C 6.7* 11/09/2012       Assessment & Plan:   See problem list. Medications and labs reviewed today.

## 2013-02-25 ENCOUNTER — Ambulatory Visit (INDEPENDENT_AMBULATORY_CARE_PROVIDER_SITE_OTHER): Payer: Medicare Other

## 2013-02-25 ENCOUNTER — Encounter (HOSPITAL_COMMUNITY): Payer: Self-pay

## 2013-02-25 DIAGNOSIS — J45909 Unspecified asthma, uncomplicated: Secondary | ICD-10-CM | POA: Diagnosis not present

## 2013-02-27 ENCOUNTER — Telehealth: Payer: Self-pay | Admitting: Internal Medicine

## 2013-02-27 MED ORDER — LEVAQUIN 750 MG PO TABS: 750.0000 mg | ORAL_TABLET | Freq: Every day | ORAL | Status: AC

## 2013-02-27 MED ORDER — PREDNISONE 5 MG PO TABS: ORAL_TABLET | ORAL | Status: DC

## 2013-02-27 NOTE — Telephone Encounter (Signed)
Patient called needing prescription for antibiotic and increased prednisone for exacerbation. [facility] [address]

## 2013-02-28 ENCOUNTER — Telehealth: Payer: Self-pay | Admitting: Pulmonary Disease

## 2013-02-28 NOTE — Telephone Encounter (Signed)
[name] [name] [phone] called w/ asthma exac> states she spoke w/ MD on call last night (?E-link) and meds called in but they wouldn't fill since they didn't have the physician's name;  [phone] & talked to Pharm> Pred20mg  tapering sched & Levaquin750 called in & OK'd by me;  Pt concerned because "I don't do well w/ generics" but Pharm only has the generic Levaquin & she'd have to wait until Mon for them to special order it for her;  She decided to pick up the Pred & Levaquin today & she is asked to call in the AM for f/u appt w/ DrRamaswami ASAP...  SMN

## 2013-03-01 MED ORDER — OMALIZUMAB 150 MG ~~LOC~~ SOLR
300.0000 mg | Freq: Once | SUBCUTANEOUS | Status: AC
  Administered 2013-03-01: 300 mg via SUBCUTANEOUS

## 2013-03-02 ENCOUNTER — Encounter (HOSPITAL_COMMUNITY): Payer: Self-pay

## 2013-03-02 ENCOUNTER — Other Ambulatory Visit: Payer: Self-pay | Admitting: Internal Medicine

## 2013-03-04 ENCOUNTER — Encounter (HOSPITAL_COMMUNITY): Admission: RE | Admit: 2013-03-04 | Payer: Self-pay | Source: Ambulatory Visit

## 2013-03-09 ENCOUNTER — Encounter (HOSPITAL_COMMUNITY): Admission: RE | Admit: 2013-03-09 | Payer: Self-pay | Source: Ambulatory Visit

## 2013-03-10 ENCOUNTER — Ambulatory Visit (INDEPENDENT_AMBULATORY_CARE_PROVIDER_SITE_OTHER): Payer: Medicare Other | Admitting: Internal Medicine

## 2013-03-10 ENCOUNTER — Telehealth: Payer: Self-pay | Admitting: Internal Medicine

## 2013-03-10 ENCOUNTER — Encounter: Payer: Self-pay | Admitting: Internal Medicine

## 2013-03-10 VITALS — BP 130/82 | HR 86 | Temp 98.4°F | Ht 63.5 in | Wt 178.0 lb

## 2013-03-10 DIAGNOSIS — J45901 Unspecified asthma with (acute) exacerbation: Secondary | ICD-10-CM | POA: Diagnosis not present

## 2013-03-10 DIAGNOSIS — E669 Obesity, unspecified: Secondary | ICD-10-CM | POA: Diagnosis not present

## 2013-03-10 MED ORDER — PREDNISONE 10 MG PO TABS: ORAL_TABLET | ORAL | Status: DC

## 2013-03-10 NOTE — Assessment & Plan Note (Signed)
 #  Weight management and borderline diabetes - Follow low glycemic diet plan as advised. Glad you have lost 14#

## 2013-03-10 NOTE — Assessment & Plan Note (Signed)
#   Acute exacerbation asthma   - Please take Take prednisone 40mg  once daily x 3 days, then 30mg  once daily x 3 days, then 20mg  once daily x 3 days, then prednisone 10mg  once daily  x 3 days and then prednisone 5mg  daily x 3 days and then prednisone 2.5mg  daily to continue at baseline  - Continue your regular asthma medications - symbicort, singulair, and xolair  - Add TUDORZA 1 puff twice daily to see if this will help   - Continue xolair for now; next dose 03/24/13. If rash recurs, will stop xolair.    - If you get worse please go to the emergency room or call us   #Followup  - 1 months or sooner if needed - spirometry at followup

## 2013-03-10 NOTE — Patient Instructions (Addendum)
#   Acute exacerbation asthma   - Please take Take prednisone 40mg  once daily x 3 days, then 30mg  once daily x 3 days, then 20mg  once daily x 3 days, then prednisone 10mg  once daily  x 3 days and then prednisone 5mg  daily x 3 days and then prednisone 2.5mg  daily to continue at baseline  - Continue your regular asthma medications - symbicort, singulair, and xolair  - Add TUDORZA 1 puff twice daily to see if this will help   - Continue xolair for now; next dose 03/24/13. If rash recurs, will stop xolair.    - If you get worse please go to the emergency room or call us   #Weight management and borderline diabetes - Follow low glycemic diet plan as advised. Glad you have lost 14#   #Followup  - 1 months or sooner if needed - spirometry at followup

## 2013-03-10 NOTE — Progress Notes (Signed)
Subjective:    Patient ID: [name] [name], female    DOB: 08-02-1941, 72 y.o.   MRN: [id number]  HPI  #with severe asthma, bronchiectasis, allergic rhinitis.  - She is followed by Dr. Marchelle Gearing.  - She is on chronic prednisone and xolair.: s normally on 2 to 5 mg prednisone per day.    - thermoplsty eval 2013: she declined   #Obesity  - 192# - nov 2013. STart low glycemic diet  - 178#  - 03/10/2013. Body mass index is 31.03 kg/(m^2).    OV 09/14/2012 Last 3 days increased chest congestion. Today, yellow sputum. Feels she is in [place]. She is unable to keep appt with [facility] for thermoplasty eval due to logistics. Hoewver, overal trend in asthma is better since starting xolair. No other new issues.  She maintains she is compliant with her mdi  Past, Family, Social reviewed: no change since last visit  You have an asthma attack brewing though there is not much wheeze on exam  take levaquin 500mg  once daily X 5 days  Please take Take prednisone 40mg  once daily x 3 days, then 30mg  once daily x 3 days, then 20mg  once daily x 3 days, then prednisone 10mg  once daily x 3 days and then daily 5mg   Follow with Dr Carmie Kanner when you can  Return in 6 months   OV 12/09/2012  [name] [name] presents for followup of asthma. She reports acute decompensation at this routine followup. For the past 3 days she has had increasing chest congestion, wheezing, sputum volume increase, change in color of sputum to green. Symptoms are not associated with fever but are associated with constant nocturnal awakening and albuterol rescue use at least 3-4 times daily which is more than baseline for her. She describes her symptoms as moderate to severe but not severe enough to require admission. There is no associated pedal edema or chest pain. Symptoms are consistent with an acute exacerbation of asthma.  In terms of medication compliance she reports compliance with her inhalers. She is due for a Xolair shot today.  Past,  Family, Social reviewed: Her hemoglobin A1c recently is 6.7 a she has been informed that she is borderline diabetes. Is been diagnosed with chronic renal insufficiency with a creatinine of 1.3 mg percent. She is asking me for advice on diet modifications. She scared of permanent renal failure and dialysis   # Acute exacerbation asthma with some sinus infection possibly  - You are in the midst of an acute attack of asthma  - Please take Take prednisone 40mg  once daily x 3 days, then 30mg  once daily x 3 days, then 20mg  once daily x 3 days, then prednisone 10mg  once daily x 3 days and then prednisone 5mg  daily to continue at baseline  - take levaquin 500mg  once daily X 7 days (no allergy noted to levaquin)  - Continue your regular asthma medications  - If you get worse please go to the emergency room or call us  - Please keep in mind at any time he noticed a decompensation you need to call us sooner rather than later  -By hypertonic saline over-the-counter nasal spray called [employer]er med sinus spray. Note it has to be hypertonic variety  #Weight management and borderline diabetes  - Follow low glycemic diet plan as advised. This will help you lose weight as well  Followup  - 3 months or sooner if needed     OV 03/10/2013   Severe persistent asthma followup  -  On 02/25/2013 she took a Xolair with a two-week delay. After that she broke into a rash. She does not think this with a Xolair allergy but thinks this was asthma acting up. She started having worsening wheezing than baseline. She spoke to on call physician and pulmonary on 02/27/2013 and was called in prednisone and Levaquin. [facility] name on it. So, the on-call physician called this again on 02/28/2013. She is able to feel the Levaquin which was genetic and caused vomiting. But the pharmacy did not give her the prednisone without my name on it. So she self medicated herself with a  prednisone burst. She feels that she did not return to baseline and is still having significant wheezing.  - In terms of obesity: She is following a low glycemic diet outlined for her. She's lost 14 pounds. Her bowel movements are improved.  Review of Systems  Constitutional: Positive for fatigue. Negative for fever and unexpected weight change.  HENT: Positive for ear pain and sore throat. Negative for nosebleeds, congestion, rhinorrhea, sneezing, trouble swallowing, dental problem, postnasal drip and sinus pressure.   Eyes: Negative for redness and itching.  Respiratory: Positive for cough, chest tightness, shortness of breath and wheezing.   Cardiovascular: Negative for palpitations and leg swelling.  Gastrointestinal: Negative for nausea and vomiting.  Genitourinary: Negative for dysuria.  Musculoskeletal: Negative for joint swelling.  Skin: Negative for rash.  Neurological: Negative for headaches.  Hematological: Does not bruise/bleed easily.  Psychiatric/Behavioral: Negative for dysphoric mood. The patient is not nervous/anxious.   Current outpatient prescriptions:ALPRAZolam (XANAX) 0.5 MG tablet, TAKE 1 TABLET BY MOUTH EVERY 6 HOURS AS NEEDED, Disp: 60 tablet, Rfl: 3;  amLODipine (NORVASC) 5 MG tablet, Take 1 tablet (5 mg total) by mouth daily., Disp: 30 tablet, Rfl: 11;  budesonide-formoterol (SYMBICORT) 160-4.5 MCG/ACT inhaler, Inhale 2 puffs into the lungs 2 (two) times daily. Rinse mouth, Disp: 1 Inhaler, Rfl: 11 desloratadine (CLARINEX) 5 MG tablet, Take 1 tablet (5 mg total) by mouth every evening. BMN, Disp: 30 tablet, Rfl: 11;  EPINEPHrine (EPIPEN) 0.3 mg/0.3 mL DEVI, Inject 0.3 mLs (0.3 mg total) into the muscle once as needed. Use as directed for severe allergic reaction, Disp: 1 Device, Rfl: 11;  esomeprazole (NEXIUM) 40 MG capsule, Take 1 capsule (40 mg total) by mouth daily before breakfast., Disp: 30 capsule, Rfl: 11 fluticasone (FLONASE) 50 MCG/ACT nasal spray, Place 2 sprays  into the nose daily., Disp: 16 g, Rfl: 2;  hydrochlorothiazide (HYDRODIURIL) 25 MG tablet, Take 1 tablet (25 mg total) by mouth daily., Disp: 30 tablet, Rfl: 11;  HYDROcodone-acetaminophen (NORCO) 7.5-325 MG per tablet, Take 1 tablet by mouth every 6 (six) hours as needed for pain., Disp: 90 tablet, Rfl: 1 levalbuterol (XOPENEX HFA) 45 MCG/ACT inhaler, Inhale 2 puffs into the lungs every 4 (four) hours as needed. Shortness of breath, Disp: 1 Inhaler, Rfl: 1;  PATANOL 0.1 % ophthalmic solution, PLACE 2 DROPS INTO BOTH EYES 2 (TWO) TIMES DAILY., Disp: 5 mL, Rfl: 2;  RESTASIS 0.05 % ophthalmic emulsion, PLACE 1 DROP INTO BOTH EYES 2 (TWO) TIMES DAILY. BMN, Disp: 60 mL, Rfl: 2 SINGULAIR 10 MG tablet, Take 1 tablet (10 mg total) by mouth daily. BRAND NAME ONLY, Disp: 90 tablet, Rfl: 3;  XOPENEX 0.63 MG/3ML nebulizer solution, USE 1 VIAL IN NEBULIZER EVERY 6 (SIX) HOURS FOR SHORTNESS OF BREATH, Disp: 360 mL, Rfl: 0;  predniSONE (DELTASONE) 10 MG tablet, Take 10 mg by  mouth daily. , Disp: , Rfl:        Objective:   Physical Exam Vitals reviewed. Constitutional: She is oriented to person, place, and time. She appears well-developed and well-nourished. No distress.       Obese  HENT:  Head: Normocephalic and atraumatic.  Right Ear: External ear normal.  Left Ear: External ear normal.  Mouth/Throat: Oropharynx is clear and moist. No oropharyngeal exudate.       Eyes: Conjunctivae normal and EOM are normal. Pupils are equal, round, and reactive to light. Right eye exhibits no discharge. Left eye exhibits no discharge. No scleral icterus.  Neck: Normal range of motion. Neck supple. No JVD present. No tracheal deviation present. No thyromegaly present.  Cardiovascular: Normal rate, regular rhythm, normal heart sounds and intact distal pulses.  Exam reveals no gallop and no friction rub.   No murmur heard. Pulmonary/Chest: Effort normal. No respiratory distress. She has wheezes. She has no rales. She exhibits  no tenderness.       Abdominal: Soft. Bowel sounds are normal. She exhibits no distension and no mass. There is no tenderness. There is no rebound and no guarding.  Musculoskeletal: Normal range of motion. She exhibits no edema and no tenderness.  Lymphadenopathy:    She has no cervical adenopathy.  Neurological: She is alert and oriented to person, place, and time. She has normal reflexes. No cranial nerve deficit. She exhibits normal muscle tone. Coordination normal.  Skin: Skin is warm and dry. No rash noted. She is not diaphoretic. No erythema. No pallor.  Psychiatric: She has a normal mood and affect. Her behavior is normal. Judgment and thought content normal.           Assessment & Plan:

## 2013-03-10 NOTE — Telephone Encounter (Signed)
[facility] and advised them [facility] states [facility] x 0 refills. Was advised it did not come through with a QTY. Nothing further was needed

## 2013-03-11 ENCOUNTER — Encounter (HOSPITAL_COMMUNITY): Payer: Self-pay

## 2013-03-11 DIAGNOSIS — J4489 Other specified chronic obstructive pulmonary disease: Secondary | ICD-10-CM | POA: Insufficient documentation

## 2013-03-11 DIAGNOSIS — G4730 Sleep apnea, unspecified: Secondary | ICD-10-CM | POA: Insufficient documentation

## 2013-03-11 DIAGNOSIS — Z5189 Encounter for other specified aftercare: Secondary | ICD-10-CM | POA: Insufficient documentation

## 2013-03-11 DIAGNOSIS — K219 Gastro-esophageal reflux disease without esophagitis: Secondary | ICD-10-CM | POA: Insufficient documentation

## 2013-03-11 DIAGNOSIS — M810 Age-related osteoporosis without current pathological fracture: Secondary | ICD-10-CM | POA: Insufficient documentation

## 2013-03-11 DIAGNOSIS — J449 Chronic obstructive pulmonary disease, unspecified: Secondary | ICD-10-CM | POA: Insufficient documentation

## 2013-03-16 ENCOUNTER — Encounter (HOSPITAL_COMMUNITY)
Admission: RE | Admit: 2013-03-16 | Discharge: 2013-03-16 | Disposition: A | Discharge status: Home / Self Care | Payer: Self-pay | Source: Ambulatory Visit | Attending: Pulmonary Disease | Admitting: Pulmonary Disease

## 2013-03-18 ENCOUNTER — Encounter (HOSPITAL_COMMUNITY)
Admission: RE | Admit: 2013-03-18 | Discharge: 2013-03-18 | Disposition: A | Discharge status: Home / Self Care | Payer: Self-pay | Source: Ambulatory Visit | Attending: Pulmonary Disease | Admitting: Pulmonary Disease

## 2013-03-23 ENCOUNTER — Encounter (HOSPITAL_COMMUNITY)
Admission: RE | Admit: 2013-03-23 | Discharge: 2013-03-23 | Disposition: A | Discharge status: Home / Self Care | Payer: Self-pay | Source: Ambulatory Visit | Attending: Pulmonary Disease | Admitting: Pulmonary Disease

## 2013-03-23 NOTE — Progress Notes (Signed)
Here today for exercise in Pulmonary Rehab. Tearful day.  States she thinks about calling hospice and stopping everything.  Long discussion with patient about her condition and how she is managing.  She says she just can't go anywhere, just sitting by someone in Rehab that has pets will cause her difficulty.   We have suggested she sit on the far end seat, away from all incoming patients to avoid some exposure.  She also states she saw an allergist at [facility] prior to her evaluation for surgery.  At that time she was advised to follow up at [facility] with the allergy department after her surgery.  She decided against the surgery , and has not followed up with Allergy Department.  We discussed at length where she would like to proceed and try to improve her situation.  She voices understanding and will think about the allergy follow up.  Cathie Olden RN

## 2013-03-30 ENCOUNTER — Encounter (HOSPITAL_COMMUNITY)
Admission: RE | Admit: 2013-03-30 | Discharge: 2013-03-30 | Disposition: A | Discharge status: Home / Self Care | Payer: Self-pay | Source: Ambulatory Visit | Attending: Pulmonary Disease | Admitting: Pulmonary Disease

## 2013-04-01 ENCOUNTER — Telehealth: Payer: Self-pay | Admitting: Internal Medicine

## 2013-04-01 ENCOUNTER — Encounter (HOSPITAL_COMMUNITY)
Admission: RE | Admit: 2013-04-01 | Discharge: 2013-04-01 | Disposition: A | Discharge status: Home / Self Care | Payer: Self-pay | Source: Ambulatory Visit | Attending: Internal Medicine | Admitting: Internal Medicine

## 2013-04-01 NOTE — Telephone Encounter (Signed)
[email or web address].Sila Sarsfield, CMA  

## 2013-04-01 NOTE — Telephone Encounter (Signed)
Last OV 03/10/13. Pt states at time of last OV MR recommended to try tudorza. Pt states she tried this but it made her cough worse so she stopped. Pt states that cough has improved some. But over the last 2 days she has been having increased SOB and wheezing and chest tightness. I could hear the pt wheezing over the phone. Pt could not complete a full sentence without stopping every 3-4 words to take a breath. Pt states she has prednisone tablets at home so if a taper is prescribed a new rx does not need to be sent. Also the pt states she did not get her last xolair shot due to not feeling well and feels this may be another reason for her increased SOB. Please advise. Carron Curie, CMA Allergies  Allergen Reactions  . 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 Latex Anaphylaxis  . Diclofenac Sodium     REACTION: Hives  . Doxycycline Nausea And Vomiting  . Penicillins     Tongue swelling  . Sulfonamide Derivatives     Tongue swelling   . Tiotropium Bromide Monohydrate     Tongue/mouth itching Dysuria   . Albuterol Anxiety    Switched to xopenex

## 2013-04-01 NOTE — Telephone Encounter (Signed)
Pt is aware of recs. I advised the pt that if symptoms get worse then to please go to ER. Pt sounded very SOB over the phone. I offered the pt an appt tomorrow but pt states she wants to try the prednisone and let us know tomorrow if she still feels she needs an appt. I will forward message to MR so he is aware and may need to call the pt. Carron Curie, CMA

## 2013-04-01 NOTE — Telephone Encounter (Signed)
Per CY-have patient take her Pred Rx as follows ; Prednisone 40mg  x 2 days, 30 mg x 2 days, 20 mg x 2 days, 10 mg x 2 days;check with MR next week.

## 2013-04-06 ENCOUNTER — Encounter (HOSPITAL_COMMUNITY): Payer: Self-pay

## 2013-04-08 ENCOUNTER — Ambulatory Visit (INDEPENDENT_AMBULATORY_CARE_PROVIDER_SITE_OTHER): Payer: Medicare Other | Admitting: Internal Medicine

## 2013-04-08 ENCOUNTER — Encounter: Payer: Self-pay | Admitting: Internal Medicine

## 2013-04-08 ENCOUNTER — Encounter (HOSPITAL_COMMUNITY): Payer: Self-pay

## 2013-04-08 VITALS — BP 132/82 | HR 82 | Temp 98.2°F | Ht 63.5 in | Wt 175.2 lb

## 2013-04-08 DIAGNOSIS — I2584 Coronary atherosclerosis due to calcified coronary lesion: Secondary | ICD-10-CM | POA: Diagnosis not present

## 2013-04-08 DIAGNOSIS — J45901 Unspecified asthma with (acute) exacerbation: Secondary | ICD-10-CM | POA: Diagnosis not present

## 2013-04-08 DIAGNOSIS — J4551 Severe persistent asthma with (acute) exacerbation: Secondary | ICD-10-CM

## 2013-04-08 DIAGNOSIS — E66811 Obesity, class 1: Secondary | ICD-10-CM

## 2013-04-08 DIAGNOSIS — E669 Obesity, unspecified: Secondary | ICD-10-CM | POA: Diagnosis not present

## 2013-04-08 DIAGNOSIS — I2510 Atherosclerotic heart disease of native coronary artery without angina pectoris: Secondary | ICD-10-CM

## 2013-04-08 MED ORDER — LEVALBUTEROL HCL 0.63 MG/3ML IN NEBU
0.6300 mg | INHALATION_SOLUTION | Freq: Once | RESPIRATORY_TRACT | Status: AC
  Administered 2013-04-08: 0.63 mg via RESPIRATORY_TRACT

## 2013-04-08 MED ORDER — PREDNISONE 10 MG PO TABS: ORAL_TABLET | ORAL | Status: DC

## 2013-04-08 MED ORDER — LEVOFLOXACIN 500 MG PO TABS: 500.0000 mg | ORAL_TABLET | Freq: Every day | ORAL | Status: AC

## 2013-04-08 MED ORDER — METHYLPREDNISOLONE ACETATE 80 MG/ML IJ SUSP
80.0000 mg | Freq: Once | INTRAMUSCULAR | Status: AC
  Administered 2013-04-08: 80 mg via INTRAMUSCULAR

## 2013-04-08 NOTE — Patient Instructions (Addendum)
#  Acute asthma exacerbation - Please have Xopenex nebulizer in the office right now - Cancel Xolair shot today - Please have the Medrol 80 mg IM x1 in the office right now - Please take Take prednisone 40mg  once daily x 3 days, then 30mg  once daily x 3 days, then 20mg  once daily x 3 days, then prednisone 10mg  once daily x 3 days and then prednisone 5mg  daily x 3 days and then prednisone 2.5mg  daily to continue at baseline -  take levaquin 500mg  once daily  X 5 days   #Chronic asthma management - Stop TUDORZA - did not help her and might have caused this exacerbation - Continue Symbicort 160/4.52 puff 2 times daily and I respect your need and desire not to change this - Continue Singulair 10 mg once a day at night - Add Qvar 80 mcg 2 puffs 2 times daily to the above regimen; learn technique-  -  - Go back to Xolair according to the next schedule - Spirometry at followup - If the above plan as not working at next visit I will refer you to [facility] Dr. Marisa Sprinkles  #Coronary artery calcification on CT scan of the chest - Please visit with Dr. Tonny Bollman  #Followup - if worse go to ER - 1 months with spirometry - Office we'll make a visit to see Dr. Tonny Bollman cardiology

## 2013-04-08 NOTE — Progress Notes (Signed)
Subjective:    Patient ID: [name] [name], female    DOB: [birth year], 72 y.o.   MRN: [id number]  HPI 1. ASTHMA - Baseline Chronic High Risk (multiple admits), Allergic, Moderate-Severe Persistent Asthma  - Allergies, gerd, sinus exacerbators.  - Frequent prednisone -> daily low dose prednisone since FEb 20112  - PFT 11/06/2010 - Mixed obstruction - restriction - fev1 1L/50%, Ratio 54, 24% BD response, TLC 3.6/78%, DLCO 16/75%  - May 2012: Switched asthma care to Dr. Marchelle Gearing  - June 2012 PFT - showed normalization with prednisone: PFts 04/18/2011 shows NORMALIZATION (fev1 1.8L/93%, ratio 68, 9% BD response, DLCO 86).  - August 2012: Fev1: 1.1lL/58% -> prednisone burst-> 1.09L/60%, Ratio 65 (no  - Nov 2012: Fev1 0.97L/51%, Ratio 60 -> pred burst  - Dec 2012: Fev1 0.7L/37%, Ratio 67%   2. Bronchiectasis, (mild RUL) seen on CT chest - April 2012 ( ABPA wu tests on prednisone in June 2012: asp antibody IgE asp fumigatus 0.24 ku/L which is borderline/equivocal, IgG for all all aspergiluus preciptins - NEGATIVE. 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 Allergic rhinitis and allergic asthma.  - 2000s: - per hx strongly skin test positive at Dr Lucie Leather office  - 2004 - trialed xolair - per hx effective but developed rash which she subjectively attributed to twice daily dosing (thogh chart review 2004-2005 states it was stopped due to subjective lack of improvement and an incidental rash resolution was noted in retrospect after stopping xolair)  - 2009: IgE 306 in July 2009. RAST positive for various grass, oak, elm, ragweed, plantain, lamb etc., s/p Allergy shot trial with Dr Maple Hudson - stopped due to transport issues  - 2012: May - IgE 588.  - 2012: June - moved allergy MD to Dr Aris Georgia   4. Lung nodule, 6 mm, on CT scan. - April 2012. Possibly calcified. RUL (no prior for comparison but a 07/17/2000 CT chest reports simillar size nodule  in similar location). Repeat CT needed April 2013   5. Gastroesophageal reflux disease with hx of UGI scope showing Hiatal hernia (small)  - delayed gastric empyting 2010 March  6. History of sleep apnea.- non compliant with CPAP   7. Significant anxiety (also mood lability hx with prednisone, worsening anxiety with albuterol)    8. Recurrent AE-ASthma  - Admitted 4/23-4/30/12  - OPD AE asthma Rx: July 2012, august 2012, Nov 2012  - Office treatment with Levaquin and prednisone-November 2013 - A sister Levaquin and prednisone -January 2014   #Obesity  - 192# - nov 2013. STart low glycemic diet  - 178#  - 03/10/2013. Body mass index is 31.03 kg/(m^2). - 175# - 04/08/2013 Body mass index is 30.54 kg/(m^2).           OV 03/10/2013   Severe persistent asthma followup  - On 02/25/2013 she took a Xolair with a two-week delay. After that she broke into a rash. She does not think this with a Xolair allergy but thinks this was asthma acting up. She started having worsening wheezing than baseline. She spoke to on call physician and pulmonary on 02/27/2013 and was called in prednisone and Levaquin. Apparently, [facility] would not [facility] without my name on it. So, the on-call physician called this again on 02/28/2013. She is able to feel the Levaquin which was genetic and caused vomiting. But the pharmacy did not give her the prednisone without  my name on it. So she self medicated herself with a prednisone burst. She feels that she did not return to baseline and is still having significant wheezing.  - In terms of obesity: She is following a low glycemic diet outlined for her. She's lost 14 pounds. Her bowel movements are improved.   # Acute exacerbation asthma  - Please take Take prednisone 40mg  once daily x 3 days, then 30mg  once daily x 3 days, then 20mg  once daily x 3 days, then prednisone 10mg  once daily x 3 days and then prednisone 5mg  daily x 3 days and then  prednisone 2.5mg  daily to continue at baseline  - Continue your regular asthma medications - symbicort, singulair, and xolair  - Add TUDORZA 1 puff twice daily to see if this will help  - Continue xolair for now; next dose 03/24/13. If rash recurs, will stop xolair.  - If you get worse please go to the emergency room or call us  #Weight management and borderline diabetes  - Follow low glycemic diet plan as advised. Glad you have lost 14#  #Followup  - 1 months or sooner if needed  - spirometry at followup  OV 04/08/2013  Follow severe persistent asthma with multiple exacerbations history Follow up also for weight loss  - In terms of asthma: She is completely frustrated. She feels after last visit asthma is worse. She's not tolerating TUDORZA. She is complaining of increased cough and increased sputum. Spirometry today 04/08/2013 shows FEV1 0.7 L/30%. Ratio 48/ Not doing well x 1 month. Hates tudorza. Affect is from a tree is as bad or worse than the time and I admitted her 1-2 years ago. However, she refuses admission. We will hold her Xolair shot today. She is completely frustrated by recurrent exacerbation and is open to second opinion at [facility]  In terms of obesity:: - She weighs 175 pounds on 04/08/2013. This is a 3 pound loss in one month. And 17 pound weight loss since November 2013.  Body mass index is 30.54 kg/(m^2).    Past, Family, Social reviewed: no change since last visit but I reviewed her past history and found that she's had coronary artery calcifications and a CT chest in the past. She has seen cardiologist Dr. Tonny Bollman for bradycardia but there is no record of having any stress test    Review of Systems  Constitutional: Negative for fever and unexpected weight change.  HENT: Negative for ear pain, nosebleeds, congestion, sore throat, rhinorrhea, sneezing, trouble swallowing, dental problem, postnasal drip and sinus pressure.   Eyes: Negative for redness and  itching.  Respiratory: Negative for cough, chest tightness, shortness of breath and wheezing.   Cardiovascular: Negative for palpitations and leg swelling.  Gastrointestinal: Negative for nausea and vomiting.  Genitourinary: Negative for dysuria.  Musculoskeletal: Negative for joint swelling.  Skin: Negative for rash.  Neurological: Negative for headaches.  Hematological: Does not bruise/bleed easily.  Psychiatric/Behavioral: Negative for dysphoric mood. The patient is not nervous/anxious.       Current outpatient prescriptions:ALPRAZolam (XANAX) 0.5 MG tablet, TAKE 1 TABLET BY MOUTH EVERY 6 HOURS AS NEEDED, Disp: 60 tablet, Rfl: 3;  amLODipine (NORVASC) 5 MG tablet, Take 1 tablet (5 mg total) by mouth daily., Disp: 30 tablet, Rfl: 11;  budesonide-formoterol (SYMBICORT) 160-4.5 MCG/ACT inhaler, Inhale 2 puffs into the lungs 2 (two) times daily. Rinse mouth, Disp: 1 Inhaler, Rfl: 11 desloratadine (CLARINEX) 5 MG tablet, Take 1 tablet (5 mg total) by mouth every evening.  BMN, Disp: 30 tablet, Rfl: 11;  EPINEPHrine (EPIPEN) 0.3 mg/0.3 mL DEVI, Inject 0.3 mLs (0.3 mg total) into the muscle once as needed. Use as directed for severe allergic reaction, Disp: 1 Device, Rfl: 11;  esomeprazole (NEXIUM) 40 MG capsule, Take 1 capsule (40 mg total) by mouth daily before breakfast., Disp: 30 capsule, Rfl: 11 fluticasone (FLONASE) 50 MCG/ACT nasal spray, Place 2 sprays into the nose daily., Disp: 16 g, Rfl: 2;  hydrochlorothiazide (HYDRODIURIL) 25 MG tablet, Take 1 tablet (25 mg total) by mouth daily., Disp: 30 tablet, Rfl: 11;  HYDROcodone-acetaminophen (NORCO) 7.5-325 MG per tablet, Take 1 tablet by mouth every 6 (six) hours as needed for pain., Disp: 90 tablet, Rfl: 1 levalbuterol (XOPENEX HFA) 45 MCG/ACT inhaler, Inhale 2 puffs into the lungs every 4 (four) hours as needed. Shortness of breath, Disp: 1 Inhaler, Rfl: 1;  PATANOL 0.1 % ophthalmic solution, PLACE 2 DROPS INTO BOTH EYES 2 (TWO) TIMES DAILY., Disp:  5 mL, Rfl: 2;  predniSONE (DELTASONE) 10 MG tablet, Take 5 mg by mouth daily., Disp: , Rfl: ;  RESTASIS 0.05 % ophthalmic emulsion, PLACE 1 DROP INTO BOTH EYES 2 (TWO) TIMES DAILY. BMN, Disp: 60 mL, Rfl: 2 SINGULAIR 10 MG tablet, Take 1 tablet (10 mg total) by mouth daily. BRAND NAME ONLY, Disp: 90 tablet, Rfl: 3;  XOPENEX 0.63 MG/3ML nebulizer solution, USE 1 VIAL IN NEBULIZER EVERY 6 (SIX) HOURS FOR SHORTNESS OF BREATH, Disp: 360 mL, Rfl: 0  Objective:   Physical Exam Vitals reviewed. Constitutional: She is oriented to person, place, and time. She appears well-developed and well-nourished. No distress.       Obese  HENT:  Head: Normocephalic and atraumatic.  Right Ear: External ear normal.  Left Ear: External ear normal.  Mouth/Throat: Oropharynx is clear and moist. No oropharyngeal exudate.       Eyes: Conjunctivae normal and EOM are normal. Pupils are equal, round, and reactive to light. Right eye exhibits no discharge. Left eye exhibits no discharge. No scleral icterus.  Neck: Normal range of motion. Neck supple. No JVD present. No tracheal deviation present. No thyromegaly present.  Cardiovascular: Normal rate, regular rhythm, normal heart sounds and intact distal pulses.  Exam reveals no gallop and no friction rub.   No murmur heard. Pulmonary/Chest: Effort normal. No respiratory distress. She has wheezes. SOUNDS NOISY AND WHEEZY She has no rales. She exhibits no tenderness.       Abdominal: Soft. Bowel sounds are normal. She exhibits no distension and no mass. There is no tenderness. There is no rebound and no guarding.  Musculoskeletal: Normal range of motion. She exhibits no edema and no tenderness.  Lymphadenopathy:    She has no cervical adenopathy.  Neurological: She is alert and oriented to person, place, and time. She has normal reflexes. No cranial nerve deficit. She exhibits normal muscle tone. Coordination normal.  Skin: Skin is warm and dry. No rash noted. She is not  diaphoretic. No erythema. No pallor.  Psychiatric: She has a normal mood and affect. Her behavior is normal. Judgment and thought content normal.             Assessment & Plan:

## 2013-04-10 DIAGNOSIS — I2584 Coronary atherosclerosis due to calcified coronary lesion: Secondary | ICD-10-CM | POA: Insufficient documentation

## 2013-04-10 DIAGNOSIS — I2510 Atherosclerotic heart disease of native coronary artery without angina pectoris: Secondary | ICD-10-CM

## 2013-04-10 HISTORY — DX: Atherosclerotic heart disease of native coronary artery without angina pectoris: I25.10

## 2013-04-10 NOTE — Assessment & Plan Note (Signed)
She continues to lose weight and is externally compliant with the low glycemic diet. I encouraged her to continue the same efforts

## 2013-04-10 NOTE — Assessment & Plan Note (Signed)
#  Acute asthma exacerbation - Please have Xopenex nebulizer in the office right now - Cancel Xolair shot today - Please have the Medrol 80 mg IM x1 in the office right now - Please take Take prednisone 40mg  once daily x 3 days, then 30mg  once daily x 3 days, then 20mg  once daily x 3 days, then prednisone 10mg  once daily x 3 days and then prednisone 5mg  daily x 3 days and then prednisone 2.5mg  daily to continue at baseline -  take levaquin 500mg  once daily  X 5 days

## 2013-04-10 NOTE — Assessment & Plan Note (Signed)
 #  Chronic asthma management - Stop TUDORZA - did not help her and might have caused this exacerbation - Continue Symbicort 160/4.52 puff 2 times daily and I respect your need and desire not to change this - Continue Singulair 10 mg once a day at night - Add Qvar 80 mcg 2 puffs 2 times daily to the above regimen; learn technique-  -  - Go back to Xolair according to the next schedule - Spirometry at followup - If the above plan as not working at next visit I will refer you to [facility] Dr. Marisa Sprinkles   #Followup - if worse go to ER - 1 months with spirometry - Office we'll make a visit to see Dr. Tonny Bollman cardiology

## 2013-04-10 NOTE — Assessment & Plan Note (Signed)
#  Coronary artery calcification on CT scan of the chest - Please visit with Dr. Tonny Bollman

## 2013-04-13 ENCOUNTER — Other Ambulatory Visit: Payer: Self-pay | Admitting: Internal Medicine

## 2013-04-13 ENCOUNTER — Encounter (HOSPITAL_COMMUNITY)
Admission: RE | Admit: 2013-04-13 | Discharge: 2013-04-13 | Disposition: A | Discharge status: Home / Self Care | Payer: Self-pay | Source: Ambulatory Visit | Attending: Pulmonary Disease | Admitting: Pulmonary Disease

## 2013-04-13 DIAGNOSIS — K219 Gastro-esophageal reflux disease without esophagitis: Secondary | ICD-10-CM | POA: Insufficient documentation

## 2013-04-13 DIAGNOSIS — J4489 Other specified chronic obstructive pulmonary disease: Secondary | ICD-10-CM | POA: Insufficient documentation

## 2013-04-13 DIAGNOSIS — J449 Chronic obstructive pulmonary disease, unspecified: Secondary | ICD-10-CM | POA: Insufficient documentation

## 2013-04-13 DIAGNOSIS — G4730 Sleep apnea, unspecified: Secondary | ICD-10-CM | POA: Insufficient documentation

## 2013-04-13 DIAGNOSIS — Z5189 Encounter for other specified aftercare: Secondary | ICD-10-CM | POA: Insufficient documentation

## 2013-04-13 DIAGNOSIS — M810 Age-related osteoporosis without current pathological fracture: Secondary | ICD-10-CM | POA: Insufficient documentation

## 2013-04-15 ENCOUNTER — Telehealth: Payer: Self-pay

## 2013-04-15 ENCOUNTER — Encounter (HOSPITAL_COMMUNITY)
Admission: RE | Admit: 2013-04-15 | Discharge: 2013-04-15 | Disposition: A | Discharge status: Home / Self Care | Payer: Self-pay | Source: Ambulatory Visit | Attending: Internal Medicine | Admitting: Internal Medicine

## 2013-04-15 NOTE — Progress Notes (Signed)
Ms. Dumire here today for Pulmonary Rehab.  We discussed her recent worsening of depression due to hr frequent exacerbations  We discussed counseling and she is agreeable.  She was given telephone # to call.  Will continue to encourage and support. Cathie Olden RN

## 2013-04-15 NOTE — Telephone Encounter (Signed)
Received [facility] from [facility] for [facility] authorization request for Patanol 0.1% eye drops. Form is being filled out.

## 2013-04-20 ENCOUNTER — Encounter (HOSPITAL_COMMUNITY)
Admission: RE | Admit: 2013-04-20 | Discharge: 2013-04-20 | Disposition: A | Discharge status: Home / Self Care | Payer: Self-pay | Source: Ambulatory Visit | Attending: Internal Medicine | Admitting: Internal Medicine

## 2013-04-22 ENCOUNTER — Encounter (HOSPITAL_COMMUNITY)
Admission: RE | Admit: 2013-04-22 | Discharge: 2013-04-22 | Disposition: A | Discharge status: Home / Self Care | Payer: Self-pay | Source: Ambulatory Visit | Attending: Internal Medicine | Admitting: Internal Medicine

## 2013-04-27 ENCOUNTER — Encounter (HOSPITAL_COMMUNITY): Payer: Self-pay

## 2013-04-29 ENCOUNTER — Encounter (HOSPITAL_COMMUNITY): Payer: Self-pay

## 2013-04-30 ENCOUNTER — Ambulatory Visit (INDEPENDENT_AMBULATORY_CARE_PROVIDER_SITE_OTHER): Payer: Medicare Other

## 2013-04-30 DIAGNOSIS — J45909 Unspecified asthma, uncomplicated: Secondary | ICD-10-CM | POA: Diagnosis not present

## 2013-05-03 MED ORDER — OMALIZUMAB 150 MG ~~LOC~~ SOLR
300.0000 mg | Freq: Once | SUBCUTANEOUS | Status: AC
  Administered 2013-05-03: 300 mg via SUBCUTANEOUS

## 2013-05-04 ENCOUNTER — Encounter (HOSPITAL_COMMUNITY)
Admission: RE | Admit: 2013-05-04 | Discharge: 2013-05-04 | Disposition: A | Discharge status: Home / Self Care | Payer: Self-pay | Source: Ambulatory Visit | Attending: Internal Medicine | Admitting: Internal Medicine

## 2013-05-06 ENCOUNTER — Encounter (HOSPITAL_COMMUNITY): Payer: Self-pay

## 2013-05-10 ENCOUNTER — Other Ambulatory Visit: Payer: Self-pay | Admitting: Internal Medicine

## 2013-05-11 ENCOUNTER — Encounter (HOSPITAL_COMMUNITY)
Admission: RE | Admit: 2013-05-11 | Discharge: 2013-05-11 | Disposition: A | Discharge status: Home / Self Care | Payer: Self-pay | Source: Ambulatory Visit | Attending: Pulmonary Disease | Admitting: Pulmonary Disease

## 2013-05-11 DIAGNOSIS — J449 Chronic obstructive pulmonary disease, unspecified: Secondary | ICD-10-CM | POA: Insufficient documentation

## 2013-05-11 DIAGNOSIS — Z5189 Encounter for other specified aftercare: Secondary | ICD-10-CM | POA: Insufficient documentation

## 2013-05-11 DIAGNOSIS — K219 Gastro-esophageal reflux disease without esophagitis: Secondary | ICD-10-CM | POA: Insufficient documentation

## 2013-05-11 DIAGNOSIS — M810 Age-related osteoporosis without current pathological fracture: Secondary | ICD-10-CM | POA: Insufficient documentation

## 2013-05-11 DIAGNOSIS — G4730 Sleep apnea, unspecified: Secondary | ICD-10-CM | POA: Insufficient documentation

## 2013-05-11 DIAGNOSIS — J4489 Other specified chronic obstructive pulmonary disease: Secondary | ICD-10-CM | POA: Insufficient documentation

## 2013-05-11 NOTE — Telephone Encounter (Signed)
[facility] spoke with [facility] gave md approval.../lmb

## 2013-05-12 ENCOUNTER — Encounter: Payer: Self-pay | Admitting: Internal Medicine

## 2013-05-12 ENCOUNTER — Ambulatory Visit (INDEPENDENT_AMBULATORY_CARE_PROVIDER_SITE_OTHER): Payer: Medicare Other | Admitting: Internal Medicine

## 2013-05-12 VITALS — BP 132/78 | HR 73 | Temp 98.6°F | Ht 64.0 in | Wt 168.6 lb

## 2013-05-12 DIAGNOSIS — J45901 Unspecified asthma with (acute) exacerbation: Secondary | ICD-10-CM | POA: Diagnosis not present

## 2013-05-12 DIAGNOSIS — J4551 Severe persistent asthma with (acute) exacerbation: Secondary | ICD-10-CM

## 2013-05-12 MED ORDER — METHYLPREDNISOLONE ACETATE 80 MG/ML IJ SUSP
80.0000 mg | Freq: Once | INTRAMUSCULAR | Status: AC
  Administered 2013-05-12: 80 mg via INTRAMUSCULAR

## 2013-05-12 MED ORDER — MOMETASONE FURO-FORMOTEROL FUM 200-5 MCG/ACT IN AERO: 2.0000 | INHALATION_SPRAY | Freq: Two times a day (BID) | RESPIRATORY_TRACT | Status: DC

## 2013-05-12 MED ORDER — LEVOFLOXACIN 500 MG PO TABS: 500.0000 mg | ORAL_TABLET | Freq: Every day | ORAL | Status: DC

## 2013-05-12 MED ORDER — PREDNISONE 10 MG PO TABS: ORAL_TABLET | ORAL | Status: DC

## 2013-05-12 NOTE — Progress Notes (Signed)
Subjective:    Patient ID: [name] [name], female    DOB: 1941/01/22, 72 y.o.   MRN: [id number]  HPI  1. ASTHMA - Baseline Chronic High Risk (multiple admits), Allergic, Moderate-Severe Persistent Asthma  - Allergies, gerd, sinus exacerbators.  - Frequent prednisone -> daily low dose prednisone since FEb 20112  - PFT 11/06/2010 - Mixed obstruction - restriction - fev1 1L/50%, Ratio 54, 24% BD response, TLC 3.6/78%, DLCO 16/75%  - May 2012: Switched asthma care to Dr. Marchelle Gearing  - June 2012 PFT - showed normalization with prednisone: PFts 04/18/2011 shows NORMALIZATION (fev1 1.8L/93%, ratio 68, 9% BD response, DLCO 86).  - August 2012: Fev1: 1.1lL/58% -> prednisone burst-> 1.09L/60%, Ratio 65 (no  - Nov 2012: Fev1 0.97L/51%, Ratio 60 -> pred burst  - Dec 2012: Fev1 0.7L/37%, Ratio 67%   2. Bronchiectasis, (mild RUL) seen on CT chest - April 2012 ( ABPA wu tests on prednisone in June 2012: asp antibody IgE asp fumigatus 0.24 ku/L which is borderline/equivocal, IgG for all all aspergiluus preciptins - NEGATIVE. 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 Allergic rhinitis and allergic asthma.  - 2000s: - per hx strongly skin test positive at Dr Lucie Leather office  - 2004 - trialed xolair - per hx effective but developed rash which she subjectively attributed to twice daily dosing (thogh chart review 2004-2005 states it was stopped due to subjective lack of improvement and an incidental rash resolution was noted in retrospect after stopping xolair)  - 2009: IgE 306 in July 2009. RAST positive for various grass, oak, elm, ragweed, plantain, lamb etc., s/p Allergy shot trial with Dr Maple Hudson - stopped due to transport issues  - 2012: May - IgE 588.  - 2012: June - moved allergy MD to Dr Aris Georgia   4. Lung nodule, 6 mm, on CT scan. - April 2012. Possibly calcified. RUL (no prior for comparison but a 07/17/2000 CT chest reports simillar size  nodule in similar location). Repeat CT needed April 2013   5. Gastroesophageal reflux disease with hx of UGI scope showing Hiatal hernia (small)  - delayed gastric empyting 2010 March  6. History of sleep apnea.- non compliant with CPAP   7. Significant anxiety (also mood lability hx with prednisone, worsening anxiety with albuterol)    8. Recurrent AE-ASthma  - Admitted 4/23-4/30/12  - OPD AE asthma Rx: July 2012, august 2012, Nov 2012  - Office treatment with Levaquin and prednisone-November 2013 - A sister Levaquin and prednisone -January 2014   #Obesity  - 192# - nov 2013. STart low glycemic diet  - 178#  - 03/10/2013. Body mass index is 31.03 kg/(m^2). - 175# - 04/08/13 - 168# - 05/12/2013 with Body mass index is 28.93 kg/(m^2).              OV 03/10/2013   Severe persistent asthma followup  - On 02/25/2013 she took a Xolair with a two-week delay. After that she broke into a rash. She does not think this with a Xolair allergy but thinks this was asthma acting up. She started having worsening wheezing than baseline. She spoke to on call physician and pulmonary on 02/27/2013 and was called in prednisone and Levaquin. Apparently, [facility] would not [facility] to the prescription without my name on it. So, the on-call physician called this again on 02/28/2013. She is able to feel the Levaquin which was genetic and caused vomiting. But  the pharmacy did not give her the prednisone without my name on it. So she self medicated herself with a prednisone burst. She feels that she did not return to baseline and is still having significant wheezing.  - In terms of obesity: She is following a low glycemic diet outlined for her. She's lost 14 pounds. Her bowel movements are improved.   # Acute exacerbation asthma  - Please take Take prednisone 40mg  once daily x 3 days, then 30mg  once daily x 3 days, then 20mg  once daily x 3 days, then prednisone 10mg  once daily x 3 days and then  prednisone 5mg  daily x 3 days and then prednisone 2.5mg  daily to continue at baseline  - Continue your regular asthma medications - symbicort, singulair, and xolair  - Add TUDORZA 1 puff twice daily to see if this will help  - Continue xolair for now; next dose 03/24/13. If rash recurs, will stop xolair.  - If you get worse please go to the emergency room or call us  #Weight management and borderline diabetes  - Follow low glycemic diet plan as advised. Glad you have lost 14#  #Followup  - 1 months or sooner if needed  - spirometry at followup   OV 04/08/13  Follow severe persistent asthma with multiple exacerbations history Follow up also for weight loss  - In terms of asthma: She is completely frustrated. She feels after last visit asthma is worse. She's not tolerating TUDORZA. She is complaining of increased cough and increased sputum. Spirometry today 04/08/2013 shows FEV1 0.7 L/30%. Ratio 48/ Not doing well x 1 month. Hates tudorza. Affect is from a tree is as bad or worse than the time and I admitted her 1-2 years ago. However, she refuses admission. We will hold her Xolair shot today. She is completely frustrated by recurrent exacerbation and is open to second opinion at [facility]  In terms of obesity:: - She weighs 175 pounds on 04/08/2013. This is a 3 pound loss in one month. And 17 pound weight loss since November 2013.      Past, Family, Social reviewed: no change since last visit but I reviewed her past history and found that she's had coronary artery calcifications and a CT chest in the past. She has seen cardiologist Dr. Tonny Bollman for bradycardia but there is no record of having any stress test  REC  #Acute asthma exacerbation - Please have Xopenex nebulizer in the office right now - Cancel Xolair shot today - Please have the Medrol 80 mg IM x1 in the office right now - Please take Take prednisone 40mg  once daily x 3 days, then 30mg  once daily x 3 days, then 20mg   once daily x 3 days, then prednisone 10mg  once daily x 3 days and then prednisone 5mg  daily x 3 days and then prednisone 2.5mg  daily to continue at baseline -  take levaquin 500mg  once daily  X 5 days   #Chronic asthma management - Stop TUDORZA - did not help her and might have caused this exacerbation - Continue Symbicort 160/4.52 puff 2 times daily and I respect your need and desire not to change this - Continue Singulair 10 mg once a day at night - Add Qvar 80 mcg 2 puffs 2 times daily to the above regimen; learn technique-  -  - Go back to Xolair according to the next schedule - Spirometry at followup - If the above plan as not working at next visit I will refer  you to [facility] Dr. Marisa Sprinkles  #Coronary artery calcification on CT scan of the chest - Please visit with Dr. Tonny Bollman  #Followup - if worse go to ER - 1 months with spirometry - Office we'll make a visit to see Dr. Tonny Bollman cardiology  OV 05/12/2013  FU severe persistent asthma and obesity  - SEvere persistent asthma: Did improved after AE-asthma Rx at last OV. Says she did not tolerate QVAR that was added to symbicort. Says she is compliant with symbicort, singulair and xolair and daily pred. However, past 1 week having recurrent ae-asthma symptoms. She is frustrated because every time she sees me she is in ae-asthma. Has increase wheeze, cough, chest tightness, Insists she is 100% compliance.   I enquired about home environement:home is [address] years old. Changes filtes q3 months, ducts are clear, carpets on floor and she vaccums regularly. Home is clean. Denies dogs, cats, birds. However, datr uses perfumes a lot and this triggers her asthma.  - Obesity   - continue sto lose weight via diet. Body mass index is 28.93 kg/(m^2).  Filed Weights   05/12/13 1452  Weight: 168 lb 9.6 oz (76.476 kg)    - Co ARt Calcification: appt pending with cards  Review of Systems  Constitutional: Negative for fever and  unexpected weight change.  HENT: Positive for rhinorrhea. Negative for ear pain, nosebleeds, congestion, sore throat, sneezing, trouble swallowing, dental problem, postnasal drip and sinus pressure.   Eyes: Negative for redness and itching.  Respiratory: Positive for cough, shortness of breath and wheezing. Negative for chest tightness.   Cardiovascular: Negative for palpitations and leg swelling.  Gastrointestinal: Positive for nausea. Negative for vomiting.  Genitourinary: Negative for dysuria.  Musculoskeletal: Negative for joint swelling.  Skin: Negative for rash.  Neurological: Positive for headaches.  Hematological: Does not bruise/bleed easily.  Psychiatric/Behavioral: Negative for dysphoric mood. The patient is nervous/anxious.       Current outpatient prescriptions:ALPRAZolam (XANAX) 0.5 MG tablet, TAKE 1 TABLET BY MOUTH EVERY 6 HOURS AS NEEDED, Disp: 60 tablet, Rfl: 3;  amLODipine (NORVASC) 5 MG tablet, Take 1 tablet (5 mg total) by mouth daily., Disp: 30 tablet, Rfl: 11;  budesonide-formoterol (SYMBICORT) 160-4.5 MCG/ACT inhaler, Inhale 2 puffs into the lungs 2 (two) times daily. Rinse mouth, Disp: 1 Inhaler, Rfl: 11 desloratadine (CLARINEX) 5 MG tablet, Take 1 tablet (5 mg total) by mouth every evening. BMN, Disp: 30 tablet, Rfl: 11;  EPINEPHrine (EPIPEN) 0.3 mg/0.3 mL DEVI, Inject 0.3 mLs (0.3 mg total) into the muscle once as needed. Use as directed for severe allergic reaction, Disp: 1 Device, Rfl: 11;  esomeprazole (NEXIUM) 40 MG capsule, Take 1 capsule (40 mg total) by mouth daily before breakfast., Disp: 30 capsule, Rfl: 11 fluticasone (FLONASE) 50 MCG/ACT nasal spray, Place 2 sprays into the nose daily., Disp: 16 g, Rfl: 2;  hydrochlorothiazide (HYDRODIURIL) 25 MG tablet, Take 1 tablet (25 mg total) by mouth daily., Disp: 30 tablet, Rfl: 11;  HYDROcodone-acetaminophen (NORCO) 7.5-325 MG per tablet, TAKE 1 TABLET EVERY 6 HOURS AS NEEDED FOR PAIN, Disp: 90 tablet, Rfl:  1 levalbuterol (XOPENEX HFA) 45 MCG/ACT inhaler, Inhale 2 puffs into the lungs every 4 (four) hours as needed. Shortness of breath, Disp: 1 Inhaler, Rfl: 1;  PATANOL 0.1 % ophthalmic solution, PLACE 2 DROPS INTO BOTH EYES 2 (TWO) TIMES DAILY., Disp: 5 mL, Rfl: 2;  PATANOL 0.1 % ophthalmic solution, PLACE 2 DROPS INTO BOTH EYES 2 (TWO) TIMES DAILY., Disp: 5 mL, Rfl: 2;  predniSONE (DELTASONE) 10 MG tablet, Take 5 mg by mouth daily., Disp: , Rfl:  predniSONE (DELTASONE) 10 MG tablet, Take 40mg  x3 days,then 30mg  x3 days,20mg  x 3 days,then 10mg  x3 days,5mg  x 3 days. Then 2.5mg  for baseline, Disp: 100 tablet, Rfl: 0;  RESTASIS 0.05 % ophthalmic emulsion, PLACE 1 DROP INTO BOTH EYES 2 (TWO) TIMES DAILY. BMN, Disp: 60 mL, Rfl: 2;  SINGULAIR 10 MG tablet, Take 1 tablet (10 mg total) by mouth daily. BRAND NAME ONLY, Disp: 90 tablet, Rfl: 3 XOPENEX 0.63 MG/3ML nebulizer solution, USE 1 VIAL IN NEBULIZER EVERY 6 (SIX) HOURS FOR SHORTNESS OF BREATH, Disp: 360 mL, Rfl: 0  Objective:   Physical Exam  Vitals reviewed. Constitutional: She is oriented to person, place, and time. She appears well-developed and well-nourished. No distress.       Obese  HENT:  Head: Normocephalic and atraumatic.  Right Ear: External ear normal.  Left Ear: External ear normal.  Mouth/Throat: Oropharynx is clear and moist. No oropharyngeal exudate.       Eyes: Conjunctivae normal and EOM are normal. Pupils are equal, round, and reactive to light. Right eye exhibits no discharge. Left eye exhibits no discharge. No scleral icterus.  Neck: Normal range of motion. Neck supple. No JVD present. No tracheal deviation present. No thyromegaly present.  Cardiovascular: Normal rate, regular rhythm, normal heart sounds and intact distal pulses.  Exam reveals no gallop and no friction rub.   No murmur heard. Pulmonary/Chest: Effort normal. No respiratory distress. She has wheezes. SOUNDS NOISY AND WHEEZY She has no rales. She exhibits no  tenderness.       Abdominal: Soft. Bowel sounds are normal. She exhibits no distension and no mass. There is no tenderness. There is no rebound and no guarding.  Musculoskeletal: Normal range of motion. She exhibits no edema and no tenderness.  Lymphadenopathy:    She has no cervical adenopathy.  Neurological: She is alert and oriented to person, place, and time. She has normal reflexes. No cranial nerve deficit. She exhibits normal muscle tone. Coordination normal.  Skin: Skin is warm and dry. No rash noted. She is not diaphoretic. No erythema. No pallor.  Psychiatric: She has a normal mood and affect. Her behavior is normal. Judgment and thought content normal.             Assessment & Plan:

## 2013-05-12 NOTE — Patient Instructions (Addendum)
#  Acute asthma exacerbation  - - Please have the Medrol 80 mg IM x1 in the office right now - Please take Take prednisone 40mg  once daily x 3 days, then 30mg  once daily x 3 days, then 20mg  once daily x 3 days, then prednisone 10mg  once daily x 3 days and then prednisone 5mg  daily x 3 days and then prednisone 2.5mg  daily to continue at baseline -  take levaquin 500mg  once daily  X 5 days   #Chronic asthma management --stop symbicort  - start dulera 200/5 2 puff twice daily - Continue Singulair 10 mg once a day at night - Stop qvar due to intolerance - Go back to Xolair according to the next schedule - NO PERFUMES AT HOME AT ALL - Spirometry at followup -refer you to [facility] Dr. Marisa Sprinkles  #Coronary artery calcification on CT scan of the chest - Please visit with Dr. Tonny Bollman  #obesity  - great job on weight lossl keep up the good work  #Followup - front desk will ensure fu with Dr Excell Seltzer  - see DR Camelia Phenes at [facility] asthma center - return to see me in  1 month; spirometry at followup - if worse go to ER

## 2013-05-12 NOTE — Assessment & Plan Note (Signed)
#  Acute asthma exacerbation  - - Please have the Medrol 80 mg IM x1 in the office right now - Please take Take prednisone 40mg  once daily x 3 days, then 30mg  once daily x 3 days, then 20mg  once daily x 3 days, then prednisone 10mg  once daily x 3 days and then prednisone 5mg  daily x 3 days and then prednisone 2.5mg  daily to continue at baseline -  take levaquin 500mg  once daily  X 5 days

## 2013-05-12 NOTE — Assessment & Plan Note (Signed)
  #  Chronic asthma management --stop symbicort  - start dulera 200/5 2 puff twice daily - Continue Singulair 10 mg once a day at night - Stop qvar due to intolerance - Go back to Xolair according to the next schedule - NO PERFUMES AT HOME AT ALL - Spirometry at followup -refer you to [facility] Dr. Marisa Sprinkles  #Coronary artery calcification on CT scan of the chest - Please visit with Dr. Tonny Bollman  #obesity  - great job on weight lossl keep up the good work  #Followup - front desk will ensure fu with Dr Excell Seltzer  - see DR Camelia Phenes at [facility] asthma center - return to see me in  1 month; spirometry at followup - if worse go to ER

## 2013-05-13 ENCOUNTER — Encounter (HOSPITAL_COMMUNITY): Payer: Self-pay

## 2013-05-18 ENCOUNTER — Encounter (HOSPITAL_COMMUNITY)
Admission: RE | Admit: 2013-05-18 | Discharge: 2013-05-18 | Disposition: A | Discharge status: Home / Self Care | Payer: Self-pay | Source: Ambulatory Visit | Attending: Internal Medicine | Admitting: Internal Medicine

## 2013-05-19 ENCOUNTER — Ambulatory Visit (INDEPENDENT_AMBULATORY_CARE_PROVIDER_SITE_OTHER): Payer: Medicare Other | Admitting: Internal Medicine

## 2013-05-19 ENCOUNTER — Encounter: Payer: Self-pay | Admitting: Internal Medicine

## 2013-05-19 VITALS — BP 150/86 | HR 53 | Temp 97.7°F | Resp 12 | Wt 168.0 lb

## 2013-05-19 DIAGNOSIS — I10 Essential (primary) hypertension: Secondary | ICD-10-CM | POA: Diagnosis not present

## 2013-05-19 DIAGNOSIS — J45909 Unspecified asthma, uncomplicated: Secondary | ICD-10-CM | POA: Diagnosis not present

## 2013-05-19 DIAGNOSIS — Z5189 Encounter for other specified aftercare: Secondary | ICD-10-CM

## 2013-05-19 DIAGNOSIS — R911 Solitary pulmonary nodule: Secondary | ICD-10-CM | POA: Diagnosis not present

## 2013-05-19 DIAGNOSIS — J4550 Severe persistent asthma, uncomplicated: Secondary | ICD-10-CM

## 2013-05-19 DIAGNOSIS — R52 Pain, unspecified: Secondary | ICD-10-CM

## 2013-05-19 DIAGNOSIS — N182 Chronic kidney disease, stage 2 (mild): Secondary | ICD-10-CM | POA: Diagnosis not present

## 2013-05-19 MED ORDER — HYDROCODONE-ACETAMINOPHEN 7.5-325 MG PO TABS: ORAL_TABLET | ORAL | Status: DC
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
## 2013-05-19 NOTE — Patient Instructions (Addendum)
1. Pulmonary - defer care to Dr. Colletta Maryland.  2. Cholesterol is much better controlled. Keep up the good work with weight loss  3. Pulmonary nodule - last CT April 13. Follow up recommended if at high risk for malignancy which to my knowledge you are not.  4. Falls - be careful. If you can access a balance class please do that  5. Weight loss -- GOOD JOB  6. Blood pressure - please continue norvasc (cough is not a listed side effect) and the whole HCTZ.  7. Pain mgt - ok for refill today.

## 2013-05-20 ENCOUNTER — Encounter (HOSPITAL_COMMUNITY)
Admission: RE | Admit: 2013-05-20 | Discharge: 2013-05-20 | Disposition: A | Discharge status: Home / Self Care | Payer: Self-pay | Source: Ambulatory Visit | Attending: Internal Medicine | Admitting: Internal Medicine

## 2013-05-22 DIAGNOSIS — R52 Pain, unspecified: Secondary | ICD-10-CM | POA: Insufficient documentation

## 2013-05-22 NOTE — Assessment & Plan Note (Signed)
Followed by pulmonary - notes reviewed. She is at her baseline today and in no distress  Plan Referred to Dr. Marisa Sprinkles at [facility] by Dr. Monica Becton

## 2013-05-22 NOTE — Assessment & Plan Note (Signed)
Reviewed adverse side effects amlodipine in ePocrates - cough is not a listed problem  BP Readings from Last 3 Encounters:  05/19/13 150/86  05/12/13 132/78  04/08/13 132/82     PLan Continue amlodipine and full dose HCT

## 2013-05-22 NOTE — Assessment & Plan Note (Signed)
Lung nodule, 6 mm, on CT scan. - April 2012. Possibly calcified. RUL (no prior for comparison but a 07/17/2000 CT chest reports simillar size nodule in similar location). repeat CT chest April 2013 - no change  Plan Advised no further ct

## 2013-05-22 NOTE — Assessment & Plan Note (Signed)
Stable, chronic renal insufficiency. No further evaluation or intervention at this time.

## 2013-05-25 ENCOUNTER — Encounter (HOSPITAL_COMMUNITY): Payer: Self-pay

## 2013-05-27 ENCOUNTER — Encounter (HOSPITAL_COMMUNITY)
Admission: RE | Admit: 2013-05-27 | Discharge: 2013-05-27 | Disposition: A | Discharge status: Home / Self Care | Payer: Self-pay | Source: Ambulatory Visit | Attending: Internal Medicine | Admitting: Internal Medicine

## 2013-05-27 ENCOUNTER — Ambulatory Visit: Payer: Medicare Other

## 2013-05-28 ENCOUNTER — Ambulatory Visit: Payer: Medicare Other

## 2013-05-28 ENCOUNTER — Ambulatory Visit (INDEPENDENT_AMBULATORY_CARE_PROVIDER_SITE_OTHER): Payer: Medicare Other

## 2013-05-28 DIAGNOSIS — J45909 Unspecified asthma, uncomplicated: Secondary | ICD-10-CM | POA: Diagnosis not present

## 2013-06-01 ENCOUNTER — Encounter (HOSPITAL_COMMUNITY)
Admission: RE | Admit: 2013-06-01 | Discharge: 2013-06-01 | Disposition: A | Discharge status: Home / Self Care | Payer: Self-pay | Source: Ambulatory Visit | Attending: Internal Medicine | Admitting: Internal Medicine

## 2013-06-03 ENCOUNTER — Encounter (HOSPITAL_COMMUNITY)
Admission: RE | Admit: 2013-06-03 | Discharge: 2013-06-03 | Disposition: A | Discharge status: Home / Self Care | Payer: Self-pay | Source: Ambulatory Visit | Attending: Internal Medicine | Admitting: Internal Medicine

## 2013-06-03 MED ORDER — OMALIZUMAB 150 MG ~~LOC~~ SOLR
300.0000 mg | Freq: Once | SUBCUTANEOUS | Status: AC
  Administered 2013-06-03: 300 mg via SUBCUTANEOUS

## 2013-06-08 ENCOUNTER — Encounter (HOSPITAL_COMMUNITY)
Admission: RE | Admit: 2013-06-08 | Discharge: 2013-06-08 | Disposition: A | Discharge status: Home / Self Care | Payer: Self-pay | Source: Ambulatory Visit | Attending: Internal Medicine | Admitting: Internal Medicine

## 2013-06-10 ENCOUNTER — Encounter: Payer: Self-pay | Admitting: Cardiovascular Disease

## 2013-06-10 ENCOUNTER — Ambulatory Visit (INDEPENDENT_AMBULATORY_CARE_PROVIDER_SITE_OTHER): Payer: Medicare Other | Admitting: Cardiovascular Disease

## 2013-06-10 ENCOUNTER — Encounter (HOSPITAL_COMMUNITY): Admission: RE | Admit: 2013-06-10 | Payer: Self-pay | Source: Ambulatory Visit

## 2013-06-10 VITALS — BP 162/82 | HR 68 | Ht 64.0 in | Wt 169.8 lb

## 2013-06-10 DIAGNOSIS — I2584 Coronary atherosclerosis due to calcified coronary lesion: Secondary | ICD-10-CM

## 2013-06-10 DIAGNOSIS — I2510 Atherosclerotic heart disease of native coronary artery without angina pectoris: Secondary | ICD-10-CM

## 2013-06-10 DIAGNOSIS — I10 Essential (primary) hypertension: Secondary | ICD-10-CM | POA: Diagnosis not present

## 2013-06-10 NOTE — Patient Instructions (Addendum)
Your physician recommends that you schedule a follow-up appointment as needed with Cardiology.   Your physician recommends that you continue on your current medications as directed. Please refer to the Current Medication list given to you today.  

## 2013-06-10 NOTE — Progress Notes (Signed)
HPI:  72 year old woman presenting for followup evaluation. The patient has severe asthma and recently saw Dr. Marchelle Gearing. He recommended that she return for followup after reviewing his CT scan of the chest that demonstrated coronary calcification as well as aortic calcification. A Holter monitor last year to evaluate bradycardia showed sinus rhythm and sinus bradycardia with a minimum heart rate of 50 beats per minute. There were no significant arrhythmias identified. Last lipid panel from April 2014 showed cholesterol 203, trig was read as 92, HDL 54, and LDL 133.  The patient is having a lot of problems with her asthma. She's going to [facility] next month for consideration of other therapies. She has shortness of breath and chest discomfort as well as cough. She complains of cough and wheezing. All these symptoms have been consistent with her asthmatic problems. She denies leg swelling, orthopnea, or PND.  Outpatient Encounter Prescriptions as of 06/10/2013  Medication Sig Dispense Refill  . ALPRAZolam (XANAX) 0.5 MG tablet TAKE 1 TABLET BY MOUTH EVERY 6 HOURS AS NEEDED  60 tablet  3  . amLODipine (NORVASC) 5 MG tablet Take 1 tablet (5 mg total) by mouth daily.  30 tablet  11  . budesonide-formoterol (SYMBICORT) 160-4.5 MCG/ACT inhaler Inhale 2 puffs into the lungs 2 (two) times daily. Rinse mouth  1 Inhaler  11  . desloratadine (CLARINEX) 5 MG tablet Take 1 tablet (5 mg total) by mouth every evening. BMN  30 tablet  11  . EPINEPHrine (EPIPEN) 0.3 mg/0.3 mL DEVI Inject 0.3 mLs (0.3 mg total) into the muscle once as needed. Use as directed for severe allergic reaction  1 Device  11  . esomeprazole (NEXIUM) 40 MG capsule Take 1 capsule (40 mg total) by mouth daily before breakfast.  30 capsule  11  . fluticasone (FLONASE) 50 MCG/ACT nasal spray Place 2 sprays into the nose daily.  16 g  2  . hydrochlorothiazide (HYDRODIURIL) 25 MG tablet Take 1 tablet (25 mg total) by mouth daily.  30 tablet  11  .  HYDROcodone-acetaminophen (NORCO) 7.5-325 MG per tablet TAKE 1 TABLET EVERY 6 HOURS AS NEEDED FOR PAIN  90 tablet  2  . levalbuterol (XOPENEX HFA) 45 MCG/ACT inhaler Inhale 2 puffs into the lungs every 4 (four) hours as needed. Shortness of breath  1 Inhaler  1  . levofloxacin (LEVAQUIN) 500 MG tablet Take 500 mg by mouth as needed.      Marland Kitchen omalizumab (XOLAIR) 150 MG injection Inject 300 mg into the skin every 28 (twenty-eight) days.      Marland Kitchen PATANOL 0.1 % ophthalmic solution PLACE 2 DROPS INTO BOTH EYES 2 (TWO) TIMES DAILY.  5 mL  2  . predniSONE (DELTASONE) 10 MG tablet Take 40 mg daily x 3 days, 30 mg daily x 3 days, 20 mg daily x 3 days, 10 mg daily x 3 days, then 5 mg daily x 3 days, 2.5 mg daily cont  50 tablet  0  . RESTASIS 0.05 % ophthalmic emulsion PLACE 1 DROP INTO BOTH EYES 2 (TWO) TIMES DAILY. BMN  60 mL  2  . SINGULAIR 10 MG tablet Take 1 tablet (10 mg total) by mouth daily. BRAND NAME ONLY  90 tablet  3  . XOPENEX 0.63 MG/3ML nebulizer solution USE 1 VIAL IN NEBULIZER EVERY 6 (SIX) HOURS FOR SHORTNESS OF BREATH  360 mL  0  . [DISCONTINUED] levofloxacin (LEVAQUIN) 500 MG tablet Take 1 tablet (500 mg total) by mouth daily.  5 tablet  0  . [DISCONTINUED] mometasone-formoterol (DULERA) 200-5 MCG/ACT AERO Inhale 2 puffs into the lungs 2 (two) times daily.  1 Inhaler  0   No facility-administered encounter medications on file as of 06/10/2013.    Allergies  Allergen Reactions  . 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 Latex Anaphylaxis  . Diclofenac Sodium     REACTION: Hives  . Doxycycline Nausea And Vomiting  . Penicillins     Tongue swelling  . Sulfonamide Derivatives     Tongue swelling   . Tiotropium Bromide Monohydrate     Tongue/mouth itching Dysuria   . Albuterol Anxiety    Switched to xopenex    Past Medical History  Diagnosis Date  . Sleep apnea   . ALLERGIC RHINITIS   . Colon polyp, hyperplastic 02/2003  . Chronic rhinosinusitis   .  Hyperlipidemia   . Acute bronchitis   . Right hip pain   . Left knee pain   . Osteoporosis   . GERD (gastroesophageal reflux disease)   . Hiatal hernia   . Helicobacter pylori gastritis 02/2009    partially treated  . Gastroparesis 2010  . Allergic rhinitis     h/o  . Chronic obstructive asthma     PFT 11/06/10 - FEV1 1.24/ 0.62; FEV1/FVC 0.56, TLC 0.78; DLCO 0.75  . Allergic asthma     h/o  . Bronchiectasis     h/o  . Complication of anesthesia 1969    "think they gave me too much; thought I wouldn't wake up"  . Hypertension   . Angina     "related to my difficulty breathing"  . COPD (chronic obstructive pulmonary disease)   . Recurrent upper respiratory infection (URI)   . Headache(784.0)     "when I get asthma attacks"  . Anxiety   . Panic attacks     "only when I get to that point where I can't catch my breath"  . Arthritis     "in left foot; dx'd 12/2010"    ROS: Negative except as per HPI  BP 162/82  Pulse 68  Ht 5\' 4"  (1.626 m)  Wt 77.021 kg (169 lb 12.8 oz)  BMI 29.13 kg/m2  PHYSICAL EXAM: Pt is alert and oriented, NAD HEENT: normal Neck: JVP - normal, carotids 2+= without bruits Lungs: diffuse wheezing and rhonchi CV: RRR without murmur or gallop Abd: soft, NT, Positive BS, no hepatomegaly Ext: no C/C/E, distal pulses intact and equal Skin: warm/dry no rash  EKG:  Normal sinus rhythm 68 beats per minute, within normal limits.  CT chest 02/17/2012: Mediastinum: Heart size is normal. There is no significant  pericardial fluid, thickening or pericardial calcification. There  is atherosclerosis of the thoracic aorta, the great vessels of the  mediastinum and the coronary arteries, including calcified  atherosclerotic plaque in the left anterior descending coronary  arteries. No pathologically enlarged mediastinal or hilar lymph  nodes. Please note that accurate exclusion of hilar adenopathy is  limited on noncontrast CT scans. Esophagus is unremarkable  in  appearance.  Lungs/Pleura: Compared to the prior examination the extensive  bronchial wall thickening noted throughout the lungs bilaterally  has resolved, as have areas of mucous plugging in the lower lobes  of the lungs bilaterally. There again several small pulmonary  nodules scattered throughout the lungs bilaterally which appear  unchanged in size, number and pattern of distribution, the largest  of which is subpleural in location in the anterior aspect of  the  right upper lobe (image 11 of series 4) measuring 6 mm. No new or  enlarging suspicious appearing pulmonary nodules or masses are  otherwise identified. No consolidative airspace disease. No  pleural effusions.  Upper Abdomen: 1.8 cm low attenuation lesion is again noted in  segment seven of the liver, compatible with a cyst.  Musculoskeletal: There are no aggressive appearing lytic or blastic  lesions noted in the visualized portions of the skeleton.  IMPRESSION:  1. No interval change in numerous sub centimeter pulmonary nodules  scattered throughout the lungs bilaterally, the largest of which  measures 6 mm in the anterior aspect of the right upper lobe.  Statistically, these lesions are favored to be benign. If the  patient is low risk patient, no further imaging follow-up is  needed. If the patient is at high risk for malignancy, 1-year  follow-up CT of the thorax in April 2014 would be recommended to  confirm stability.  2. Atherosclerosis, including left anterior descending coronary  artery disease. Please note that although the presence of coronary  artery calcium documents the presence of coronary artery disease,  the severity of this disease and any potential stenosis cannot be  assessed on this non-gated CT examination. Assessment for  potential risk factor modification, dietary therapy or  pharmacologic therapy may be warranted, if clinically indicated.    ASSESSMENT AND PLAN: 1. CAD - incidentally  noted with coronary calcification on CT. We discussed potential therapies including ASA for antiplatelet Rx and a statin drug for lipid-lowering/CV risk reduction. She's had reactions to multiple medications and is concerned about introducing new drugs. She's on chronic prednisone and concern about ASA toxicity/side effects are legitimate. I would favor continued observation at this point. Her asthma is clearly her primary problem and coronary disease appears quiescent. She has no symptoms of angina, although these symptoms admittedly would be difficult to distinguish from those of her asthma.  2. Bradycardia - reported at last year's visit. No longer seems to be a problem.  For follow-up, I'll see her if problems arise.  Tonny Bollman 06/15/2013 11:13 PM

## 2013-06-14 ENCOUNTER — Telehealth: Payer: Self-pay

## 2013-06-14 NOTE — Telephone Encounter (Signed)
Received a prior authorization form for Clarinex. The form was submitted today. Received a notice back that prior authorization for Clarinex is approved through 11/10/2013. Approval was faxed to [id number].

## 2013-06-15 ENCOUNTER — Encounter (HOSPITAL_COMMUNITY): Payer: Self-pay

## 2013-06-15 DIAGNOSIS — G4730 Sleep apnea, unspecified: Secondary | ICD-10-CM | POA: Insufficient documentation

## 2013-06-15 DIAGNOSIS — M810 Age-related osteoporosis without current pathological fracture: Secondary | ICD-10-CM | POA: Insufficient documentation

## 2013-06-15 DIAGNOSIS — J4489 Other specified chronic obstructive pulmonary disease: Secondary | ICD-10-CM | POA: Insufficient documentation

## 2013-06-15 DIAGNOSIS — K219 Gastro-esophageal reflux disease without esophagitis: Secondary | ICD-10-CM | POA: Insufficient documentation

## 2013-06-15 DIAGNOSIS — Z5189 Encounter for other specified aftercare: Secondary | ICD-10-CM | POA: Insufficient documentation

## 2013-06-15 DIAGNOSIS — J449 Chronic obstructive pulmonary disease, unspecified: Secondary | ICD-10-CM | POA: Insufficient documentation

## 2013-06-17 ENCOUNTER — Encounter (HOSPITAL_COMMUNITY)
Admission: RE | Admit: 2013-06-17 | Discharge: 2013-06-17 | Disposition: A | Discharge status: Home / Self Care | Payer: Self-pay | Source: Ambulatory Visit | Attending: Pulmonary Disease | Admitting: Pulmonary Disease

## 2013-06-22 ENCOUNTER — Encounter (HOSPITAL_COMMUNITY)
Admission: RE | Admit: 2013-06-22 | Discharge: 2013-06-22 | Disposition: A | Discharge status: Home / Self Care | Payer: Self-pay | Source: Ambulatory Visit | Attending: Internal Medicine | Admitting: Internal Medicine

## 2013-06-24 ENCOUNTER — Encounter (HOSPITAL_COMMUNITY)
Admission: RE | Admit: 2013-06-24 | Discharge: 2013-06-24 | Disposition: A | Discharge status: Home / Self Care | Payer: Self-pay | Source: Ambulatory Visit | Attending: Internal Medicine | Admitting: Internal Medicine

## 2013-06-28 ENCOUNTER — Ambulatory Visit: Payer: Medicare Other

## 2013-06-29 ENCOUNTER — Encounter (HOSPITAL_COMMUNITY): Payer: Self-pay

## 2013-07-01 ENCOUNTER — Encounter (HOSPITAL_COMMUNITY): Payer: Self-pay

## 2013-07-06 ENCOUNTER — Encounter (HOSPITAL_COMMUNITY)
Admission: RE | Admit: 2013-07-06 | Discharge: 2013-07-06 | Disposition: A | Discharge status: Home / Self Care | Payer: Self-pay | Source: Ambulatory Visit | Attending: Internal Medicine | Admitting: Internal Medicine

## 2013-07-07 ENCOUNTER — Ambulatory Visit (INDEPENDENT_AMBULATORY_CARE_PROVIDER_SITE_OTHER): Payer: Medicare Other

## 2013-07-07 ENCOUNTER — Other Ambulatory Visit: Payer: Self-pay | Admitting: Internal Medicine

## 2013-07-07 ENCOUNTER — Encounter: Payer: Self-pay | Admitting: Internal Medicine

## 2013-07-07 ENCOUNTER — Ambulatory Visit (INDEPENDENT_AMBULATORY_CARE_PROVIDER_SITE_OTHER): Payer: Medicare Other | Admitting: Internal Medicine

## 2013-07-07 VITALS — BP 140/90 | HR 86 | Temp 97.8°F | Ht 64.0 in | Wt 167.0 lb

## 2013-07-07 DIAGNOSIS — J45909 Unspecified asthma, uncomplicated: Secondary | ICD-10-CM | POA: Diagnosis not present

## 2013-07-07 DIAGNOSIS — J45901 Unspecified asthma with (acute) exacerbation: Secondary | ICD-10-CM | POA: Diagnosis not present

## 2013-07-07 DIAGNOSIS — J4550 Severe persistent asthma, uncomplicated: Secondary | ICD-10-CM

## 2013-07-07 NOTE — Telephone Encounter (Signed)
Hydrocodone called to pharmacy  

## 2013-07-07 NOTE — Patient Instructions (Addendum)
ASthma appears stable  #Chronic asthma management --continue symbicort (CMA will add dulera to allergy as headache) - Continue Singulair 10 mg once a day at night - Continue xolair - [facility] Dr. Marisa Sprinkles attp 08/02/13  #Coronary artery calcification on CT scan of the chest - Please keep up with  Dr. Tonny Bollman  #obesity  - great job on weight lossl keep up the good work  #Followup -  see DR Camelia Phenes at [facility] asthma center - return to see me in  2 month; spirometry at followup - if worse go to ER - flu shot in fall

## 2013-07-07 NOTE — Progress Notes (Signed)
Subjective:    Patient ID: [name] [name], female    DOB: Apr 04, 1941, 72 y.o.   MRN: [id number]  HPI 1. ASTHMA - Baseline Chronic High Risk (multiple admits), Allergic, Moderate-Severe Persistent Asthma  - Allergies, gerd, sinus exacerbators.  - Frequent prednisone -> daily low dose prednisone since FEb 20112  - PFT 11/06/2010 - Mixed obstruction - restriction - fev1 1L/50%, Ratio 54, 24% BD response, TLC 3.6/78%, DLCO 16/75%  - May 2012: Switched asthma care to Dr. Marchelle Gearing  - June 2012 PFT - showed normalization with prednisone: PFts 04/18/2011 shows NORMALIZATION (fev1 1.8L/93%, ratio 68, 9% BD response, DLCO 86).  - August 2012: Fev1: 1.1lL/58% -> prednisone burst-> 1.09L/60%, Ratio 65 (no  - Nov 2012: Fev1 0.97L/51%, Ratio 60 -> pred burst  - Dec 2012: Fev1 0.7L/37%, Ratio 67%  - August 2014: FEV1  1.17 L/62%  2. Bronchiectasis, (mild RUL) seen on CT chest - April 2012 ( ABPA wu tests on prednisone in June 2012: asp antibody IgE asp fumigatus 0.24 ku/L which is borderline/equivocal, IgG for all all aspergiluus preciptins - NEGATIVE. 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 Allergic rhinitis and allergic asthma.  - 2000s: - per hx strongly skin test positive at Dr Lucie Leather office  - 2004 - trialed xolair - per hx effective but developed rash which she subjectively attributed to twice daily dosing (thogh chart review 2004-2005 states it was stopped due to subjective lack of improvement and an incidental rash resolution was noted in retrospect after stopping xolair)  - 2009: IgE 306 in July 2009. RAST positive for various grass, oak, elm, ragweed, plantain, lamb etc., s/p Allergy shot trial with Dr Maple Hudson - stopped due to transport issues  - 2012: May - IgE 588.  - 2012: June - moved allergy MD to Dr Aris Georgia   4. Lung nodule, 6 mm, on CT scan. - April 2012. Possibly calcified. RUL (no prior for comparison but a 07/17/2000 CT  chest reports simillar size nodule in similar location). Repeat CT needed April 2013   5. Gastroesophageal reflux disease with hx of UGI scope showing Hiatal hernia (small)  - delayed gastric empyting 2010 March  6. History of sleep apnea.- non compliant with CPAP   7. Significant anxiety (also mood lability hx with prednisone, worsening anxiety with albuterol)    8. Recurrent AE-ASthma  - Admitted 4/23-4/30/12  - OPD AE asthma Rx: July 2012, august 2012, Nov 2012  - Office treatment with Levaquin and prednisone-November 2013 -  Levaquin and prednisone -January 2014   #Obesity  - 192# - nov 2013. STart low glycemic diet  - 178#  - 03/10/2013. Body mass index is 31.03 kg/(m^2). - 175# - 04/08/13 - 168# - 05/12/2013 with Body mass index is 28.93 kg/(m^2). - 167# on 07/07/2013 with Body mass index is 28.65 kg/(m^2).    OV 05/12/2013  FU severe persistent asthma and obesity  - SEvere persistent asthma: Did improved after AE-asthma Rx at last OV. Says she did not tolerate QVAR that was added to symbicort. Says she is compliant with symbicort, singulair and xolair and daily pred. However, past 1 week having recurrent ae-asthma symptoms. She is frustrated because every time she sees me she is in ae-asthma. Has increase wheeze, cough, chest tightness, Insists she is 100% compliance.   I enquired about home environement:home is [address] years old. Changes filtes q3 months, ducts are clear, carpets on floor  and she vaccums regularly. Home is clean. Denies dogs, cats, birds. However, datr uses perfumes a lot and this triggers her asthma.  - Obesity   - continue sto lose weight via diet. Body mass index is 28.93 kg/(m^2).    - Co ARt Calcification: appt pending with cards   OV 07/07/2013   dulera gave headache. So back on symbicort. Asthma is stable but at severe persistent level. [facility] appt pending. Has lot of social issues; son sufferered stroke,. Fev1 today :  1.17 L/62% and is  baseline.    Review of Systems  Constitutional: Negative for fever and unexpected weight change.  HENT: Negative for ear pain, nosebleeds, congestion, sore throat, rhinorrhea, sneezing, trouble swallowing, dental problem, postnasal drip and sinus pressure.   Eyes: Negative for redness and itching.  Respiratory: Positive for cough and shortness of breath. Negative for chest tightness and wheezing.   Cardiovascular: Negative for palpitations and leg swelling.  Gastrointestinal: Negative for nausea and vomiting.  Genitourinary: Negative for dysuria.  Musculoskeletal: Negative for joint swelling.  Skin: Negative for rash.  Neurological: Negative for headaches.  Hematological: Does not bruise/bleed easily.  Psychiatric/Behavioral: Negative for dysphoric mood. The patient is not nervous/anxious.       Current outpatient prescriptions:ALPRAZolam (XANAX) 0.5 MG tablet, TAKE 1 TABLET BY MOUTH EVERY 6 HOURS AS NEEDED, Disp: 60 tablet, Rfl: 3;  amLODipine (NORVASC) 5 MG tablet, Take 1 tablet (5 mg total) by mouth daily., Disp: 30 tablet, Rfl: 11;  budesonide-formoterol (SYMBICORT) 160-4.5 MCG/ACT inhaler, Inhale 2 puffs into the lungs 2 (two) times daily. Rinse mouth, Disp: 1 Inhaler, Rfl: 11 desloratadine (CLARINEX) 5 MG tablet, Take 1 tablet (5 mg total) by mouth every evening. BMN, Disp: 30 tablet, Rfl: 11;  EPINEPHrine (EPIPEN) 0.3 mg/0.3 mL DEVI, Inject 0.3 mLs (0.3 mg total) into the muscle once as needed. Use as directed for severe allergic reaction, Disp: 1 Device, Rfl: 11;  esomeprazole (NEXIUM) 40 MG capsule, Take 1 capsule (40 mg total) by mouth daily before breakfast., Disp: 30 capsule, Rfl: 11 fluticasone (FLONASE) 50 MCG/ACT nasal spray, Place 2 sprays into the nose daily., Disp: 16 g, Rfl: 2;  hydrochlorothiazide (HYDRODIURIL) 25 MG tablet, Take 1 tablet (25 mg total) by mouth daily., Disp: 30 tablet, Rfl: 11;  HYDROcodone-acetaminophen (NORCO) 7.5-325 MG per tablet, TAKE 1 TABLET EVERY 6  HOURS AS NEEDED FOR PAIN, Disp: 90 tablet, Rfl: 1 levalbuterol (XOPENEX HFA) 45 MCG/ACT inhaler, Inhale 2 puffs into the lungs every 4 (four) hours as needed. Shortness of breath, Disp: 1 Inhaler, Rfl: 1;  omalizumab (XOLAIR) 150 MG injection, Inject 300 mg into the skin every 28 (twenty-eight) days., Disp: , Rfl: ;  PATANOL 0.1 % ophthalmic solution, PLACE 2 DROPS INTO BOTH EYES 2 (TWO) TIMES DAILY., Disp: 5 mL, Rfl: 2 RESTASIS 0.05 % ophthalmic emulsion, PLACE 1 DROP INTO BOTH EYES 2 (TWO) TIMES DAILY. BMN, Disp: 60 mL, Rfl: 2;  SINGULAIR 10 MG tablet, Take 1 tablet (10 mg total) by mouth daily. BRAND NAME ONLY, Disp: 90 tablet, Rfl: 3;  XOPENEX 0.63 MG/3ML nebulizer solution, USE 1 VIAL IN NEBULIZER EVERY 6 (SIX) HOURS FOR SHORTNESS OF BREATH, Disp: 360 mL, Rfl: 0  Objective:   Physical Exam Vitals reviewed. Constitutional: She is oriented to person, place, and time. She appears well-developed and well-nourished. No distress.       Obese  HENT:  Head: Normocephalic and atraumatic.  Right Ear: External ear normal.  Left Ear: External ear normal.  Mouth/Throat: Oropharynx is  clear and moist. No oropharyngeal exudate.       Eyes: Conjunctivae normal and EOM are normal. Pupils are equal, round, and reactive to light. Right eye exhibits no discharge. Left eye exhibits no discharge. No scleral icterus.  Neck: Normal range of motion. Neck supple. No JVD present. No tracheal deviation present. No thyromegaly present.  Cardiovascular: Normal rate, regular rhythm, normal heart sounds and intact distal pulses.  Exam reveals no gallop and no friction rub.   No murmur heard. Pulmonary/Chest: Effort normal. No respiratory distress. She has no rales. She exhibits no tenderness.       Abdominal: Soft. Bowel sounds are normal. She exhibits no distension and no mass. There is no tenderness. There is no rebound and no guarding.  Musculoskeletal: Normal range of motion. She exhibits no edema and no tenderness.   Lymphadenopathy:    She has no cervical adenopathy.  Neurological: She is alert and oriented to person, place, and time. She has normal reflexes. No cranial nerve deficit. She exhibits normal muscle tone. Coordination normal.  Skin: Skin is warm and dry. No rash noted. She is not diaphoretic. No erythema. No pallor.  Psychiatric: She has a normal mood and affect. Her behavior is normal. Judgment and thought content normal.          Assessment & Plan:

## 2013-07-08 ENCOUNTER — Encounter (HOSPITAL_COMMUNITY): Payer: Self-pay

## 2013-07-08 MED ORDER — OMALIZUMAB 150 MG ~~LOC~~ SOLR
300.0000 mg | Freq: Once | SUBCUTANEOUS | Status: AC
  Administered 2013-07-08: 300 mg via SUBCUTANEOUS

## 2013-07-10 NOTE — Assessment & Plan Note (Signed)
ASthma appears stable  #Chronic asthma management --continue symbicort (CMA will add dulera to allergy as headache) - Continue Singulair 10 mg once a day at night - Continue xolair - [facility] Dr. Kraft attp 08/02/13  #Coronary artery calcification on CT scan of the chest - Please keep up with  Dr. Michael Cooper  #obesity  - great job on weight lossl keep up the good work  #Followup -  see DR M Kraft at [facility] center - return to see me in  2 month; spirometry at followup - if worse go to ER - flu shot in fall  

## 2013-07-13 ENCOUNTER — Encounter (HOSPITAL_COMMUNITY)
Admission: RE | Admit: 2013-07-13 | Discharge: 2013-07-13 | Disposition: A | Discharge status: Home / Self Care | Payer: Self-pay | Source: Ambulatory Visit | Attending: Pulmonary Disease | Admitting: Pulmonary Disease

## 2013-07-13 DIAGNOSIS — Z5189 Encounter for other specified aftercare: Secondary | ICD-10-CM | POA: Insufficient documentation

## 2013-07-13 DIAGNOSIS — J449 Chronic obstructive pulmonary disease, unspecified: Secondary | ICD-10-CM | POA: Insufficient documentation

## 2013-07-13 DIAGNOSIS — M810 Age-related osteoporosis without current pathological fracture: Secondary | ICD-10-CM | POA: Insufficient documentation

## 2013-07-13 DIAGNOSIS — J4489 Other specified chronic obstructive pulmonary disease: Secondary | ICD-10-CM | POA: Insufficient documentation

## 2013-07-13 DIAGNOSIS — G4730 Sleep apnea, unspecified: Secondary | ICD-10-CM | POA: Insufficient documentation

## 2013-07-13 DIAGNOSIS — K219 Gastro-esophageal reflux disease without esophagitis: Secondary | ICD-10-CM | POA: Insufficient documentation

## 2013-07-15 ENCOUNTER — Encounter (HOSPITAL_COMMUNITY)
Admission: RE | Admit: 2013-07-15 | Discharge: 2013-07-15 | Disposition: A | Discharge status: Home / Self Care | Payer: Self-pay | Source: Ambulatory Visit | Attending: Internal Medicine | Admitting: Internal Medicine

## 2013-07-20 ENCOUNTER — Encounter (HOSPITAL_COMMUNITY): Payer: Self-pay

## 2013-07-22 ENCOUNTER — Encounter (HOSPITAL_COMMUNITY)
Admission: RE | Admit: 2013-07-22 | Discharge: 2013-07-22 | Disposition: A | Discharge status: Home / Self Care | Payer: Self-pay | Source: Ambulatory Visit | Attending: Internal Medicine | Admitting: Internal Medicine

## 2013-07-27 ENCOUNTER — Encounter (HOSPITAL_COMMUNITY)
Admission: RE | Admit: 2013-07-27 | Discharge: 2013-07-27 | Disposition: A | Discharge status: Home / Self Care | Payer: Self-pay | Source: Ambulatory Visit | Attending: Internal Medicine | Admitting: Internal Medicine

## 2013-07-29 ENCOUNTER — Encounter (HOSPITAL_COMMUNITY): Payer: Self-pay

## 2013-08-02 DIAGNOSIS — J479 Bronchiectasis, uncomplicated: Secondary | ICD-10-CM | POA: Diagnosis not present

## 2013-08-02 DIAGNOSIS — R918 Other nonspecific abnormal finding of lung field: Secondary | ICD-10-CM | POA: Diagnosis not present

## 2013-08-02 DIAGNOSIS — E559 Vitamin D deficiency, unspecified: Secondary | ICD-10-CM | POA: Diagnosis not present

## 2013-08-02 DIAGNOSIS — D721 Eosinophilia, unspecified: Secondary | ICD-10-CM | POA: Diagnosis not present

## 2013-08-02 DIAGNOSIS — J45909 Unspecified asthma, uncomplicated: Secondary | ICD-10-CM | POA: Diagnosis not present

## 2013-08-02 DIAGNOSIS — J3489 Other specified disorders of nose and nasal sinuses: Secondary | ICD-10-CM | POA: Diagnosis not present

## 2013-08-02 DIAGNOSIS — J329 Chronic sinusitis, unspecified: Secondary | ICD-10-CM | POA: Diagnosis not present

## 2013-08-02 DIAGNOSIS — K219 Gastro-esophageal reflux disease without esophagitis: Secondary | ICD-10-CM | POA: Diagnosis not present

## 2013-08-02 DIAGNOSIS — Z5181 Encounter for therapeutic drug level monitoring: Secondary | ICD-10-CM | POA: Diagnosis not present

## 2013-08-02 DIAGNOSIS — J45902 Unspecified asthma with status asthmaticus: Secondary | ICD-10-CM | POA: Diagnosis not present

## 2013-08-02 DIAGNOSIS — Z79899 Other long term (current) drug therapy: Secondary | ICD-10-CM | POA: Diagnosis not present

## 2013-08-02 DIAGNOSIS — IMO0002 Reserved for concepts with insufficient information to code with codable children: Secondary | ICD-10-CM | POA: Diagnosis not present

## 2013-08-02 LAB — PULMONARY FUNCTION TEST

## 2013-08-03 ENCOUNTER — Encounter (HOSPITAL_COMMUNITY): Payer: Self-pay

## 2013-08-04 ENCOUNTER — Ambulatory Visit: Payer: Medicare Other

## 2013-08-04 ENCOUNTER — Other Ambulatory Visit: Payer: Self-pay | Admitting: Internal Medicine

## 2013-08-04 ENCOUNTER — Telehealth: Payer: Self-pay | Admitting: Internal Medicine

## 2013-08-04 ENCOUNTER — Ambulatory Visit: Payer: Medicare Other | Admitting: Emergency Medicine

## 2013-08-04 MED ORDER — LEVOFLOXACIN 500 MG PO TABS: 500.0000 mg | ORAL_TABLET | Freq: Every day | ORAL | Status: DC

## 2013-08-04 MED ORDER — PREDNISONE 10 MG PO TABS: ORAL_TABLET | ORAL | Status: DC

## 2013-08-04 NOTE — Telephone Encounter (Signed)
take levaquin 500mg  once daily  X 5 days  Take prednisone 40 mg daily x 2 days, then 20mg  daily x 2 days, then 10mg  daily x 2 days, then 5mg  daily x 2 days and stop or go  To baselien if on chrnic pred   Dr. Kalman Shan, M.D., [facility].C.P Pulmonary and Critical Care Medicine Staff Physician [facility] Pager: [phone], If no answer or between  15:00h - 7:00h: call [phone]  08/04/2013 2:00 PM

## 2013-08-04 NOTE — Telephone Encounter (Signed)
I spoke with pt and she is aware of recs. She stated when we send in levaquin she requests the name brand and not generic. RX's have been sent.

## 2013-08-04 NOTE — Telephone Encounter (Signed)
I spoke with pt. She c/o cough w/ yellow phlem, chest congestion, slight wheezing but no chest tx and no increase SOB. She stated this started yesterday. She has not taken anything for this. She is requesting ABX to be called in. Please advise MR thanks  Allergies  Allergen Reactions  . 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 Latex Anaphylaxis  . Diclofenac Sodium     REACTION: Hives  . Doxycycline Nausea And Vomiting  . Penicillins     Tongue swelling  . Sulfonamide Derivatives     Tongue swelling   . Tiotropium Bromide Monohydrate     Tongue/mouth itching Dysuria   . Albuterol Anxiety    Switched to xopenex

## 2013-08-05 ENCOUNTER — Encounter (HOSPITAL_COMMUNITY): Payer: Self-pay

## 2013-08-06 ENCOUNTER — Ambulatory Visit (INDEPENDENT_AMBULATORY_CARE_PROVIDER_SITE_OTHER): Payer: Medicare Other

## 2013-08-06 DIAGNOSIS — J4550 Severe persistent asthma, uncomplicated: Secondary | ICD-10-CM

## 2013-08-06 DIAGNOSIS — J45909 Unspecified asthma, uncomplicated: Secondary | ICD-10-CM

## 2013-08-06 MED ORDER — OMALIZUMAB 150 MG ~~LOC~~ SOLR
300.0000 mg | Freq: Once | SUBCUTANEOUS | Status: AC
Start: 2013-08-06 — End: 2013-08-06
  Administered 2013-08-06: 300 mg via SUBCUTANEOUS

## 2013-08-10 ENCOUNTER — Encounter (HOSPITAL_COMMUNITY)
Admission: RE | Admit: 2013-08-10 | Discharge: 2013-08-10 | Disposition: A | Discharge status: Home / Self Care | Payer: Self-pay | Source: Ambulatory Visit | Attending: Internal Medicine | Admitting: Internal Medicine

## 2013-08-12 ENCOUNTER — Encounter (HOSPITAL_COMMUNITY)
Admission: RE | Admit: 2013-08-12 | Discharge: 2013-08-12 | Disposition: A | Discharge status: Home / Self Care | Payer: Self-pay | Source: Ambulatory Visit | Attending: Pulmonary Disease | Admitting: Pulmonary Disease

## 2013-08-12 DIAGNOSIS — G4730 Sleep apnea, unspecified: Secondary | ICD-10-CM | POA: Insufficient documentation

## 2013-08-12 DIAGNOSIS — Z5189 Encounter for other specified aftercare: Secondary | ICD-10-CM | POA: Insufficient documentation

## 2013-08-12 DIAGNOSIS — M810 Age-related osteoporosis without current pathological fracture: Secondary | ICD-10-CM | POA: Insufficient documentation

## 2013-08-12 DIAGNOSIS — J449 Chronic obstructive pulmonary disease, unspecified: Secondary | ICD-10-CM | POA: Insufficient documentation

## 2013-08-12 DIAGNOSIS — K219 Gastro-esophageal reflux disease without esophagitis: Secondary | ICD-10-CM | POA: Insufficient documentation

## 2013-08-12 DIAGNOSIS — J4489 Other specified chronic obstructive pulmonary disease: Secondary | ICD-10-CM | POA: Insufficient documentation

## 2013-08-17 ENCOUNTER — Encounter (HOSPITAL_COMMUNITY)
Admission: RE | Admit: 2013-08-17 | Discharge: 2013-08-17 | Disposition: A | Discharge status: Home / Self Care | Payer: Self-pay | Source: Ambulatory Visit | Attending: Internal Medicine | Admitting: Internal Medicine

## 2013-08-18 ENCOUNTER — Ambulatory Visit (INDEPENDENT_AMBULATORY_CARE_PROVIDER_SITE_OTHER): Payer: Medicare Other | Admitting: Internal Medicine

## 2013-08-18 ENCOUNTER — Encounter: Payer: Self-pay | Admitting: Internal Medicine

## 2013-08-18 VITALS — BP 140/82 | HR 65 | Temp 98.4°F | Wt 158.8 lb

## 2013-08-18 DIAGNOSIS — N182 Chronic kidney disease, stage 2 (mild): Secondary | ICD-10-CM

## 2013-08-18 DIAGNOSIS — R82998 Other abnormal findings in urine: Secondary | ICD-10-CM | POA: Diagnosis not present

## 2013-08-18 DIAGNOSIS — R8290 Unspecified abnormal findings in urine: Secondary | ICD-10-CM

## 2013-08-18 DIAGNOSIS — Z0000 Encounter for general adult medical examination without abnormal findings: Secondary | ICD-10-CM

## 2013-08-18 DIAGNOSIS — J45909 Unspecified asthma, uncomplicated: Secondary | ICD-10-CM

## 2013-08-18 DIAGNOSIS — J4550 Severe persistent asthma, uncomplicated: Secondary | ICD-10-CM

## 2013-08-18 DIAGNOSIS — M810 Age-related osteoporosis without current pathological fracture: Secondary | ICD-10-CM

## 2013-08-18 DIAGNOSIS — R52 Pain, unspecified: Secondary | ICD-10-CM

## 2013-08-18 DIAGNOSIS — Z5189 Encounter for other specified aftercare: Secondary | ICD-10-CM

## 2013-08-18 MED ORDER — HYDROCODONE-ACETAMINOPHEN 7.5-325 MG PO TABS: 1.0000 | ORAL_TABLET | Freq: Four times a day (QID) | ORAL | Status: DC | PRN

## 2013-08-18 MED ORDER — CHOLECALCIFEROL 1.25 MG (50000 UT) PO TABS: 1.0000 | ORAL_TABLET | ORAL | Status: DC

## 2013-08-18 NOTE — Assessment & Plan Note (Signed)
At consultation she was started on Qvar.

## 2013-08-18 NOTE — Assessment & Plan Note (Signed)
Scheduled for DEXA and also for wellness exam.

## 2013-08-18 NOTE — Assessment & Plan Note (Signed)
Patients renal function has been very stable since May '10.

## 2013-08-18 NOTE — Progress Notes (Signed)
Subjective:    Patient ID: [name] [name], female    DOB: [birth year], 72 y.o.   MRN: [id number]  HPI [name] [name] presents to have labs explained: she was seen by Dr.Kraft at [facility] and had labs drawn 08/02/13. She was told she had renal insufficiency and needed nephrology follow up for a Creatinine of 1.3. Advised her that we can follow this and it is not particularly high. She had a vitamin D level of 9 and was given Vit D 3  4 tabs - 12 additional doses sent to pharmacy. ANCA, Cmet, CBC normal with 9% eos. U/A was positive for LE and bacteria but she was not treated. She is sent to lab for follow up study.  She was referred to [facility] but she prefers to continue to see Dr. Pollyann Kennedy. She was referred to PCP but prefers to keep her care here. Records from Dr. Russella Dar were requested - she can advise Dr. Marisa Sprinkles that those records are available via [facility].  She wishes to have samples of inhalational meds and also needs Rx for Norco - now needs monthly Rxs.  PMH, FamHx and SocHx reviewed for any changes and relevance.  Current Outpatient Prescriptions on File Prior to Visit  Medication Sig Dispense Refill  . ALPRAZolam (XANAX) 0.5 MG tablet TAKE 1 TABLET BY MOUTH EVERY 6 HOURS AS NEEDED  60 tablet  3  . amLODipine (NORVASC) 5 MG tablet Take 1 tablet (5 mg total) by mouth daily.  30 tablet  11  . budesonide-formoterol (SYMBICORT) 160-4.5 MCG/ACT inhaler Inhale 2 puffs into the lungs 2 (two) times daily. Rinse mouth  1 Inhaler  11  . desloratadine (CLARINEX) 5 MG tablet Take 1 tablet (5 mg total) by mouth every evening. BMN  30 tablet  11  . EPINEPHrine (EPIPEN) 0.3 mg/0.3 mL DEVI Inject 0.3 mLs (0.3 mg total) into the muscle once as needed. Use as directed for severe allergic reaction  1 Device  11  . esomeprazole (NEXIUM) 40 MG capsule Take 1 capsule (40 mg total) by mouth daily before breakfast.  30 capsule  11  . fluticasone (FLONASE) 50 MCG/ACT nasal spray Place 2 sprays into the nose daily.  16 g   2  . hydrochlorothiazide (HYDRODIURIL) 25 MG tablet Take 1 tablet (25 mg total) by mouth daily.  30 tablet  11  . levofloxacin (LEVAQUIN) 500 MG tablet Take 1 tablet (500 mg total) by mouth daily. Pt requests name brand  5 tablet  0  . omalizumab (XOLAIR) 150 MG injection Inject 300 mg into the skin every 28 (twenty-eight) days.      Marland Kitchen PATANOL 0.1 % ophthalmic solution PLACE 2 DROPS INTO BOTH EYES 2 (TWO) TIMES DAILY.  5 mL  2  . predniSONE (DELTASONE) 10 MG tablet Take 40 mg daily x 2 days, 20 mg daily x 2 days, 10 mg daily x 2 days, 5 mg daily x 2 days then stop  15 tablet  0  . RESTASIS 0.05 % ophthalmic emulsion PLACE 1 DROP INTO BOTH EYES 2 (TWO) TIMES DAILY. BMN  60 mL  2  . SINGULAIR 10 MG tablet Take 1 tablet (10 mg total) by mouth daily. BRAND NAME ONLY  90 tablet  3  . XOPENEX 0.63 MG/3ML nebulizer solution USE 1 VIAL IN NEBULIZER EVERY 6 (SIX) HOURS FOR SHORTNESS OF BREATH  360 mL  0  . XOPENEX HFA 45 MCG/ACT inhaler INHALE 2 PUFFS INTO THE LUNGS EVERY 4 (FOUR) HOURS AS  NEEDED. SHORTNESS OF BREATH  15 g  1   No current facility-administered medications on file prior to visit.      Review of Systems System review is negative for any constitutional, cardiac, pulmonary, GI or neuro symptoms or complaints other than as described in the HPI.     Objective:   Physical Exam Filed Vitals:   08/18/13 1306  BP: 140/82  Pulse: 65  Temp: 98.4 F (36.9 C)   Wt Readings from Last 3 Encounters:  08/18/13 158 lb 12.8 oz (72.031 kg)  07/07/13 167 lb (75.751 kg)  06/10/13 169 lb 12.8 oz (77.021 kg)  07/23/12         190 lb  gen'l WNWD woman who has lost a lot of weight but looks good. HEENT- C&S clear Cor - 2+ radial pulse Pulm - no increased WOB but prolonged expiratory phase with wheeze Neuro - normal "get up and go." normal ambulation.       Assessment & Plan:  Abnormal U/A - sent to lab for follow up study with recommendation to follow

## 2013-08-18 NOTE — Progress Notes (Deleted)
Subjective:     Patient ID: [name] [name], female   DOB: 1940/11/29, 72 y.o.   MRN: [id number]  HPI   Review of Systems     Objective:   Physical Exam     Assessment:     ***    Plan:     ***

## 2013-08-18 NOTE — Patient Instructions (Signed)
Your lab work from [facility]: Vitamin D level was 9, normal 30+. I agree with Vit D 3/cholecalciferol 50,000 iu weekly x 16 weeks Neutrophil cytoplasmic antibody test - negative. Thyroid functions TSH, Free T4 were normal. General chemistry was normal Urinalysis was positive for infection  Plan: 1. Dr. Marisa Sprinkles can access Dr. Anselm Jungling records by using EPIC using Care Everywhere , [place] 2. You need to call [facility] to have your records sent to Dr. Marisa Sprinkles 3. Repeat urinalysis today to see if you have a continued infection. 4. Bone density study ordered 5. Ask schedulers to schedule wellness exam.

## 2013-08-18 NOTE — Assessment & Plan Note (Signed)
Provided 30 day supply Norco and 3 scripts per new rules.

## 2013-08-19 ENCOUNTER — Encounter (HOSPITAL_COMMUNITY)
Admission: RE | Admit: 2013-08-19 | Discharge: 2013-08-19 | Disposition: A | Discharge status: Home / Self Care | Payer: Self-pay | Source: Ambulatory Visit | Attending: Internal Medicine | Admitting: Internal Medicine

## 2013-08-23 ENCOUNTER — Ambulatory Visit (INDEPENDENT_AMBULATORY_CARE_PROVIDER_SITE_OTHER)
Admission: RE | Admit: 2013-08-23 | Discharge: 2013-08-23 | Disposition: A | Discharge status: Home / Self Care | Payer: Medicare Other | Source: Ambulatory Visit | Attending: Internal Medicine | Admitting: Internal Medicine

## 2013-08-23 ENCOUNTER — Other Ambulatory Visit: Payer: Medicare Other

## 2013-08-23 DIAGNOSIS — M810 Age-related osteoporosis without current pathological fracture: Secondary | ICD-10-CM | POA: Diagnosis not present

## 2013-08-23 DIAGNOSIS — R8290 Unspecified abnormal findings in urine: Secondary | ICD-10-CM

## 2013-08-23 LAB — URINALYSIS, ROUTINE W REFLEX MICROSCOPIC
Bilirubin Urine: NEGATIVE
Ketones, ur: NEGATIVE
Nitrite: NEGATIVE
Total Protein, Urine: NEGATIVE
Urobilinogen, UA: 0.2 (ref 0.0–1.0)
pH: 6 (ref 5.0–8.0)

## 2013-08-24 ENCOUNTER — Encounter (HOSPITAL_COMMUNITY)
Admission: RE | Admit: 2013-08-24 | Discharge: 2013-08-24 | Disposition: A | Discharge status: Home / Self Care | Payer: Self-pay | Source: Ambulatory Visit | Attending: Internal Medicine | Admitting: Internal Medicine

## 2013-08-25 ENCOUNTER — Other Ambulatory Visit: Payer: Self-pay | Admitting: Internal Medicine

## 2013-08-25 MED ORDER — CIPROFLOXACIN HCL 250 MG PO TABS: 250.0000 mg | ORAL_TABLET | Freq: Two times a day (BID) | ORAL | Status: DC

## 2013-08-26 ENCOUNTER — Encounter (HOSPITAL_COMMUNITY): Payer: Medicare Other

## 2013-08-30 ENCOUNTER — Encounter: Payer: Self-pay | Admitting: Internal Medicine

## 2013-08-31 ENCOUNTER — Encounter (HOSPITAL_COMMUNITY)
Admission: RE | Admit: 2013-08-31 | Discharge: 2013-08-31 | Disposition: A | Discharge status: Home / Self Care | Payer: Self-pay | Source: Ambulatory Visit | Attending: Internal Medicine | Admitting: Internal Medicine

## 2013-09-02 ENCOUNTER — Encounter (HOSPITAL_COMMUNITY)
Admission: RE | Admit: 2013-09-02 | Discharge: 2013-09-02 | Disposition: A | Discharge status: Home / Self Care | Payer: Self-pay | Source: Ambulatory Visit | Attending: Internal Medicine | Admitting: Internal Medicine

## 2013-09-03 ENCOUNTER — Other Ambulatory Visit: Payer: Self-pay | Admitting: Internal Medicine

## 2013-09-06 ENCOUNTER — Ambulatory Visit (INDEPENDENT_AMBULATORY_CARE_PROVIDER_SITE_OTHER): Payer: Medicare Other

## 2013-09-06 ENCOUNTER — Telehealth: Payer: Self-pay | Admitting: Internal Medicine

## 2013-09-06 DIAGNOSIS — J45909 Unspecified asthma, uncomplicated: Secondary | ICD-10-CM | POA: Diagnosis not present

## 2013-09-06 DIAGNOSIS — J4550 Severe persistent asthma, uncomplicated: Secondary | ICD-10-CM

## 2013-09-06 MED ORDER — OMALIZUMAB 150 MG ~~LOC~~ SOLR
300.0000 mg | Freq: Once | SUBCUTANEOUS | Status: AC
  Administered 2013-09-06: 300 mg via SUBCUTANEOUS

## 2013-09-06 MED ORDER — XOPENEX 0.63 MG/3ML IN NEBU: 1.0000 | INHALATION_SOLUTION | Freq: Four times a day (QID) | RESPIRATORY_TRACT | Status: DC | PRN

## 2013-09-06 NOTE — Telephone Encounter (Signed)
Rx was clarified with pt, she needs the nebulizer medication. DAW rx has been sent in.

## 2013-09-07 ENCOUNTER — Encounter (HOSPITAL_COMMUNITY)
Admission: RE | Admit: 2013-09-07 | Discharge: 2013-09-07 | Disposition: A | Discharge status: Home / Self Care | Payer: Self-pay | Source: Ambulatory Visit | Attending: Internal Medicine | Admitting: Internal Medicine

## 2013-09-08 ENCOUNTER — Telehealth: Payer: Self-pay | Admitting: Internal Medicine

## 2013-09-08 MED ORDER — BUDESONIDE-FORMOTEROL FUMARATE 160-4.5 MCG/ACT IN AERO: 2.0000 | INHALATION_SPRAY | Freq: Two times a day (BID) | RESPIRATORY_TRACT | Status: DC

## 2013-09-08 NOTE — Telephone Encounter (Signed)
Pt aware 1 sample left for pick up. Nothing further needed 

## 2013-09-09 ENCOUNTER — Encounter (HOSPITAL_COMMUNITY)
Admission: RE | Admit: 2013-09-09 | Discharge: 2013-09-09 | Disposition: A | Discharge status: Home / Self Care | Payer: Self-pay | Source: Ambulatory Visit | Attending: Internal Medicine | Admitting: Internal Medicine

## 2013-09-14 ENCOUNTER — Encounter (HOSPITAL_COMMUNITY): Payer: Self-pay

## 2013-09-14 DIAGNOSIS — J449 Chronic obstructive pulmonary disease, unspecified: Secondary | ICD-10-CM | POA: Insufficient documentation

## 2013-09-14 DIAGNOSIS — M810 Age-related osteoporosis without current pathological fracture: Secondary | ICD-10-CM | POA: Insufficient documentation

## 2013-09-14 DIAGNOSIS — G4730 Sleep apnea, unspecified: Secondary | ICD-10-CM | POA: Insufficient documentation

## 2013-09-14 DIAGNOSIS — Z5189 Encounter for other specified aftercare: Secondary | ICD-10-CM | POA: Insufficient documentation

## 2013-09-14 DIAGNOSIS — J4489 Other specified chronic obstructive pulmonary disease: Secondary | ICD-10-CM | POA: Insufficient documentation

## 2013-09-14 DIAGNOSIS — K219 Gastro-esophageal reflux disease without esophagitis: Secondary | ICD-10-CM | POA: Insufficient documentation

## 2013-09-15 ENCOUNTER — Other Ambulatory Visit: Payer: Medicare Other

## 2013-09-15 ENCOUNTER — Ambulatory Visit (INDEPENDENT_AMBULATORY_CARE_PROVIDER_SITE_OTHER): Payer: Medicare Other | Admitting: Internal Medicine

## 2013-09-15 ENCOUNTER — Encounter: Payer: Self-pay | Admitting: Internal Medicine

## 2013-09-15 VITALS — BP 150/86 | HR 60 | Temp 98.7°F | Wt 162.0 lb

## 2013-09-15 DIAGNOSIS — E559 Vitamin D deficiency, unspecified: Secondary | ICD-10-CM | POA: Diagnosis not present

## 2013-09-15 DIAGNOSIS — N390 Urinary tract infection, site not specified: Secondary | ICD-10-CM | POA: Diagnosis not present

## 2013-09-15 LAB — URINALYSIS, ROUTINE W REFLEX MICROSCOPIC
Bilirubin Urine: NEGATIVE
Specific Gravity, Urine: 1.01 (ref 1.000–1.030)
Total Protein, Urine: NEGATIVE
Urine Glucose: NEGATIVE

## 2013-09-15 MED ORDER — CHOLECALCIFEROL 1.25 MG (50000 UT) PO TABS: 1.0000 | ORAL_TABLET | ORAL | Status: DC

## 2013-09-15 NOTE — Progress Notes (Signed)
Pre-visit discussion using our clinic review tool. No additional management support is needed unless otherwise documented below in the visit note.  

## 2013-09-15 NOTE — Patient Instructions (Signed)
1. Vitamin D deficiency - need to complete a full 16 weeks of replacement at 50,000 units a week and then start 1,000 iu Vit D daily.  2. Follow-up UTI - to lab today with results to be called to you.

## 2013-09-16 ENCOUNTER — Encounter (HOSPITAL_COMMUNITY)
Admission: RE | Admit: 2013-09-16 | Discharge: 2013-09-16 | Disposition: A | Discharge status: Home / Self Care | Payer: Self-pay | Source: Ambulatory Visit | Attending: Pulmonary Disease | Admitting: Pulmonary Disease

## 2013-09-16 ENCOUNTER — Other Ambulatory Visit: Payer: Self-pay

## 2013-09-16 DIAGNOSIS — E559 Vitamin D deficiency, unspecified: Secondary | ICD-10-CM | POA: Insufficient documentation

## 2013-09-16 HISTORY — DX: Vitamin D deficiency, unspecified: E55.9

## 2013-09-16 NOTE — Progress Notes (Signed)
  HPI [name] [name] presents for follow of Vitamin D deficiency. She was getting this from [place]. She had 5 weeks of therapy at 50,000 iu weekly.   She also would like a follow up on her UTI.   PMH, FamHx and SocHx reviewed for any changes and relevance.  Current Outpatient Prescriptions on File Prior to Visit  Medication Sig Dispense Refill  . ALPRAZolam (XANAX) 0.5 MG tablet TAKE 1 TABLET BY MOUTH EVERY 6 HOURS AS NEEDED  60 tablet  3  . amLODipine (NORVASC) 5 MG tablet Take 1 tablet (5 mg total) by mouth daily.  30 tablet  11  . budesonide-formoterol (SYMBICORT) 160-4.5 MCG/ACT inhaler Inhale 2 puffs into the lungs 2 (two) times daily. Rinse mouth  1 Inhaler  0  . desloratadine (CLARINEX) 5 MG tablet Take 1 tablet (5 mg total) by mouth every evening. BMN  30 tablet  11  . EPINEPHrine (EPIPEN) 0.3 mg/0.3 mL DEVI Inject 0.3 mLs (0.3 mg total) into the muscle once as needed. Use as directed for severe allergic reaction  1 Device  11  . esomeprazole (NEXIUM) 40 MG capsule Take 1 capsule (40 mg total) by mouth daily before breakfast.  30 capsule  11  . hydrochlorothiazide (HYDRODIURIL) 25 MG tablet Take 1 tablet (25 mg total) by mouth daily.  30 tablet  11  . HYDROcodone-acetaminophen (NORCO) 7.5-325 MG per tablet Take 1 tablet by mouth every 6 (six) hours as needed for pain. MAY FILL ON OR AFTER 11/01/2013  90 tablet  0  . omalizumab (XOLAIR) 150 MG injection Inject 300 mg into the skin every 28 (twenty-eight) days.      Marland Kitchen PATANOL 0.1 % ophthalmic solution PLACE 2 DROPS INTO BOTH EYES 2 (TWO) TIMES DAILY.  5 mL  2  . PATANOL 0.1 % ophthalmic solution PLACE 2 DROPS INTO BOTH EYES 2 (TWO) TIMES DAILY.  5 mL  2  . predniSONE (DELTASONE) 10 MG tablet Take 40 mg daily x 2 days, 20 mg daily x 2 days, 10 mg daily x 2 days, 5 mg daily x 2 days then stop  15 tablet  0  . RESTASIS 0.05 % ophthalmic emulsion PLACE 1 DROP INTO BOTH EYES 2 (TWO) TIMES DAILY. BMN  60 mL  2  . SINGULAIR 10 MG tablet Take 1 tablet  (10 mg total) by mouth daily. BRAND NAME ONLY  90 tablet  3  . XOPENEX 0.63 MG/3ML nebulizer solution Take 3 mLs (0.63 mg total) by nebulization every 6 (six) hours as needed for wheezing.  360 mL  2  . XOPENEX HFA 45 MCG/ACT inhaler INHALE 2 PUFFS INTO THE LUNGS EVERY 4 (FOUR) HOURS AS NEEDED. SHORTNESS OF BREATH  15 g  1   No current facility-administered medications on file prior to visit.      Review of Systems System review is negative for any constitutional, cardiac, pulmonary, GI or neuro symptoms or complaints other than as described in the HPI.     Objective:   Physical Exam Filed Vitals:   09/15/13 1309  BP: 150/86  Pulse: 60  Temp: 98.7 F (37.1 C)   Gen'l- WNWD woman PUlm - audible breath sounds, wheezing. Neuro - awake and alert.

## 2013-09-16 NOTE — Assessment & Plan Note (Signed)
Vitamin D deficiency - to complete 16 weeks of Vit D3 50,000 iu weekly then maintenance dosing of 1,000 iu daily

## 2013-09-20 ENCOUNTER — Ambulatory Visit (INDEPENDENT_AMBULATORY_CARE_PROVIDER_SITE_OTHER): Payer: Medicare Other | Admitting: Internal Medicine

## 2013-09-20 ENCOUNTER — Encounter: Payer: Self-pay | Admitting: Internal Medicine

## 2013-09-20 VITALS — BP 142/90 | HR 58 | Temp 97.4°F | Ht 63.5 in | Wt 159.8 lb

## 2013-09-20 DIAGNOSIS — J4550 Severe persistent asthma, uncomplicated: Secondary | ICD-10-CM

## 2013-09-20 DIAGNOSIS — Z23 Encounter for immunization: Secondary | ICD-10-CM | POA: Diagnosis not present

## 2013-09-20 DIAGNOSIS — Z860100 Personal history of colon polyps, unspecified: Secondary | ICD-10-CM

## 2013-09-20 DIAGNOSIS — J45909 Unspecified asthma, uncomplicated: Secondary | ICD-10-CM

## 2013-09-20 DIAGNOSIS — E785 Hyperlipidemia, unspecified: Secondary | ICD-10-CM

## 2013-09-20 DIAGNOSIS — K219 Gastro-esophageal reflux disease without esophagitis: Secondary | ICD-10-CM | POA: Diagnosis not present

## 2013-09-20 DIAGNOSIS — M159 Polyosteoarthritis, unspecified: Secondary | ICD-10-CM

## 2013-09-20 DIAGNOSIS — Z0000 Encounter for general adult medical examination without abnormal findings: Secondary | ICD-10-CM

## 2013-09-20 DIAGNOSIS — E559 Vitamin D deficiency, unspecified: Secondary | ICD-10-CM

## 2013-09-20 MED ORDER — CHOLECALCIFEROL 1.25 MG (50000 UT) PO TABS: 1.0000 | ORAL_TABLET | ORAL | Status: DC

## 2013-09-20 NOTE — Progress Notes (Signed)
Pre visit review using our clinic review tool, if applicable. No additional management support is needed unless otherwise documented below in the visit note. 

## 2013-09-20 NOTE — Patient Instructions (Addendum)
[facility] studies: labs were normal. 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 ANCA was normal. Vit D - 9, deficient. CT chest - changes of bronchitis/asthma, evidence of bronchiectasis and bronchiolectasis, multiple small pulmonary nodules unchanged from 2013.  Bone density study reveals osteopenia - mild bone loss. Plan - calcium 1200 mg daily (diet plus supplement) and the vitamin D replacement - 12 more weeks after that take 1,000 iu daily.  No need for repeat labs today. No need for any x-rays today.   Gyn - it is recommended by the [employer] that a woman with a history of normal PAP smears may stop at age 53. There is a need for a vulvo-vaginal exam every 5 years. Mammograms should be done every other year.  GI-- You are due for colonoscopy and referral has been put in.  Immunizations - up to date except for 1) Prevnar conjugate pneumonia vaccine - will give today 2) Shingles vaccine - Rx provided so you can get this at a participating pharmacy.  Over all - general health except for lungs is good.
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
## 2013-09-21 ENCOUNTER — Encounter (HOSPITAL_COMMUNITY): Payer: Self-pay

## 2013-09-21 NOTE — Assessment & Plan Note (Signed)
Had difficulty getting Rx for D3 50,000 iu/wk filled. Reordered today and personally spoke with pharmacist to clear confusion about two prescribers: Dr. Marisa Sprinkles and Dr. Debby Bud.  Plan Complete 16 weeks of replacement treatment than convert to daily maintenance of 1,000 iu daily.

## 2013-09-21 NOTE — Assessment & Plan Note (Signed)
Interval history - no hospitalizations, steady as she goes. Limited physical exam notable for pulmonary findings. She is current with gyn. Referral for GI pre-colonoscopy visit is requested. She is due for mammography. Immunizations reviewed.  In summary A nice woman with bad asthma who appears medically stable at today's visit.

## 2013-09-21 NOTE — Assessment & Plan Note (Signed)
Recent lab work [facility] revealed OK lipids

## 2013-09-21 NOTE — Assessment & Plan Note (Signed)
Chronic arthritic pain but she is not limited in ADLs and remains very independent. On exam - no flare in small joints hands or UE; gait is normal.

## 2013-09-21 NOTE — Assessment & Plan Note (Signed)
Having s bit of a flare today but no in distress. She will be returning to Dr. Marisa Sprinkles at [facility] and does continue to follow with Dr. Marchelle Gearing

## 2013-09-21 NOTE — Assessment & Plan Note (Signed)
Taking Nexium daily with good results - symptoms are well controlled.

## 2013-09-23 ENCOUNTER — Encounter (HOSPITAL_COMMUNITY)
Admission: RE | Admit: 2013-09-23 | Discharge: 2013-09-23 | Disposition: A | Discharge status: Home / Self Care | Payer: Self-pay | Source: Ambulatory Visit | Attending: Internal Medicine | Admitting: Internal Medicine

## 2013-09-27 ENCOUNTER — Telehealth (HOSPITAL_COMMUNITY): Payer: Self-pay | Admitting: *Deleted

## 2013-09-28 ENCOUNTER — Encounter (HOSPITAL_COMMUNITY)
Admission: RE | Admit: 2013-09-28 | Discharge: 2013-09-28 | Disposition: A | Discharge status: Home / Self Care | Payer: Self-pay | Source: Ambulatory Visit | Attending: Pulmonary Disease | Admitting: Pulmonary Disease

## 2013-09-30 ENCOUNTER — Encounter (HOSPITAL_COMMUNITY): Payer: Self-pay

## 2013-10-01 ENCOUNTER — Other Ambulatory Visit: Payer: Self-pay | Admitting: Internal Medicine

## 2013-10-05 ENCOUNTER — Encounter (HOSPITAL_COMMUNITY): Payer: Self-pay

## 2013-10-07 ENCOUNTER — Encounter (HOSPITAL_COMMUNITY): Payer: Self-pay

## 2013-10-11 ENCOUNTER — Ambulatory Visit: Payer: Medicare Other

## 2013-10-12 ENCOUNTER — Encounter (HOSPITAL_COMMUNITY): Payer: Medicare Other

## 2013-10-12 DIAGNOSIS — M810 Age-related osteoporosis without current pathological fracture: Secondary | ICD-10-CM | POA: Insufficient documentation

## 2013-10-12 DIAGNOSIS — G4730 Sleep apnea, unspecified: Secondary | ICD-10-CM | POA: Insufficient documentation

## 2013-10-12 DIAGNOSIS — K219 Gastro-esophageal reflux disease without esophagitis: Secondary | ICD-10-CM | POA: Insufficient documentation

## 2013-10-12 DIAGNOSIS — J449 Chronic obstructive pulmonary disease, unspecified: Secondary | ICD-10-CM | POA: Insufficient documentation

## 2013-10-12 DIAGNOSIS — J4489 Other specified chronic obstructive pulmonary disease: Secondary | ICD-10-CM | POA: Insufficient documentation

## 2013-10-12 DIAGNOSIS — Z5189 Encounter for other specified aftercare: Secondary | ICD-10-CM | POA: Insufficient documentation

## 2013-10-13 ENCOUNTER — Ambulatory Visit: Payer: Medicare Other

## 2013-10-13 ENCOUNTER — Ambulatory Visit: Payer: Medicare Other | Admitting: Gastroenterology

## 2013-10-14 ENCOUNTER — Encounter (HOSPITAL_COMMUNITY): Payer: Self-pay

## 2013-10-18 ENCOUNTER — Encounter: Payer: Self-pay | Admitting: Internal Medicine

## 2013-10-18 ENCOUNTER — Ambulatory Visit (INDEPENDENT_AMBULATORY_CARE_PROVIDER_SITE_OTHER): Payer: Medicare Other | Admitting: Internal Medicine

## 2013-10-18 VITALS — BP 128/74 | HR 56 | Temp 98.0°F | Ht 64.0 in | Wt 154.0 lb

## 2013-10-18 DIAGNOSIS — J45909 Unspecified asthma, uncomplicated: Secondary | ICD-10-CM

## 2013-10-18 DIAGNOSIS — J4550 Severe persistent asthma, uncomplicated: Secondary | ICD-10-CM

## 2013-10-18 NOTE — Progress Notes (Signed)
Subjective:    Patient ID: [name] [name], female    DOB: 1941/08/30, 72 y.o.   MRN: [id number]  HPI  HPI 1. ASTHMA - Baseline Chronic High Risk (multiple admits), Allergic, Moderate-Severe Persistent Asthma  - Allergies, gerd, sinus exacerbators.  - Frequent prednisone -> daily low dose prednisone since FEb 20112  - PFT 11/06/2010 - Mixed obstruction - restriction - fev1 1L/50%, Ratio 54, 24% BD response, TLC 3.6/78%, DLCO 16/75%  - May 2012: Switched asthma care to Dr. Marchelle Gearing  - June 2012 PFT - showed normalization with prednisone: PFts 04/18/2011 shows NORMALIZATION (fev1 1.8L/93%, ratio 68, 9% BD response, DLCO 86).  - August 2012: Fev1: 1.1lL/58% -> prednisone burst-> 1.09L/60%, Ratio 65 (no  - Nov 2012: Fev1 0.97L/51%, Ratio 60 -> pred burst  - Dec 2012: Fev1 0.7L/37%, Ratio 67%  - August 2014: FEV1  1.17 L/62%  2. Bronchiectasis, (mild RUL) seen on CT chest - April 2012 ( ABPA wu tests on prednisone in June 2012: asp antibody IgE asp fumigatus 0.24 ku/L which is borderline/equivocal, IgG for all all aspergiluus preciptins - NEGATIVE. 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 Allergic rhinitis and allergic asthma.  - 2000s: - per hx strongly skin test positive at Dr Lucie Leather office  - 2004 - trialed xolair - per hx effective but developed rash which she subjectively attributed to twice daily dosing (thogh chart review 2004-2005 states it was stopped due to subjective lack of improvement and an incidental rash resolution was noted in retrospect after stopping xolair)  - 2009: IgE 306 in July 2009. RAST positive for various grass, oak, elm, ragweed, plantain, lamb etc., s/p Allergy shot trial with Dr Maple Hudson - stopped due to transport issues  - 2012: May - IgE 588.  - 2012: June - moved allergy MD to Dr Aris Georgia  - 2013: Restart Xolair    4. Lung nodule, 6 mm, on CT scan. - April 2012. Possibly calcified. RUL (no  prior for comparison but a 07/17/2000 CT chest reports simillar size nodule in similar location). Repeat CT needed April 2013   5. Gastroesophageal reflux disease with hx of UGI scope showing Hiatal hernia (small)  - delayed gastric empyting 2010 March  6. History of sleep apnea.- non compliant with CPAP   7. Significant anxiety (also mood lability hx with prednisone, worsening anxiety with albuterol)    8. Recurrent AE-ASthma  - Admitted 4/23-4/30/12  - OPD AE asthma Rx: July 2012, august 2012, Nov 2012  - Office treatment with Levaquin and prednisone-November 2013 -  Levaquin and prednisone -January 2014   #Obesity  - 192# - nov 2013. STart low glycemic diet  - 178#  - 03/10/2013. Body mass index is 31.03 kg/(m^2). - 175# - 04/08/13 - 168# - 05/12/2013 with Body mass index is 28.93 kg/(m^2). - 167# on 07/07/2013 with Body mass index is 28.65 kg/(m^2). -  154# on 10/18/2013 wuth Body mass index is 26.42 kg/(m^2).      OV 10/18/2013    Chief Complaint  Patient presents with  . Follow-up    Pt c/o SOB, chest tightness, congestion, prod cough with yellow mucous X1wk.   Follow severe  persistent asthma on daily prednisone between 2 and 5 mg  After last visit she did go see Dr. Maxine Glenn crafted end of September 2014 at [facility]. Extensive evaluation was done and the findings are documented below. On the day  of the visit she was found to be in active asthma with high elevated exam nitric oxide. ANCA  test was negative. Aspergillus flavus and night he was positive. CT scan only showed bronchitis. Overall low compliance was considered as a reason for her severe persistent asthma. However ENT evaluation was recommended but since she is already seeing a local ENT specialist in [place] she deferred that. A polysomnogram was recommended but because she's lost significant weight she deferred that. Dr. Marisa Sprinkles consider lung biopsy versus new research study against eos  in asthma versus  CellCept but patient is not interested in any of this  08/04/13 - [facility] eval - Asp Flavsu - Positive. Asp [place] - Positive (2012 IgE 588) . Asp Fumigatus - negative, ANCA - negativ/ Exhaled NO 37 and HIGH,, LOW VIT  D - 9.  CT chest - Co art cakcufucatuib ub ?CAD. SMall lung nodule - unchanged since April 2103. Imp Bronchitis   Today overall she says that her asthma isn't flareup but it's better than 10 days ago. She was treated with ciprofloxacin for urinary tract infection 10 days ago she also self upper prednisone dose and so she is better. She does not want another prednisone burst or antibiotic regimen. She has missed Xolarir; last end oct 2014. Wil hold off today due to wheezing  Review of Systems  Constitutional: Negative for fever and unexpected weight change.  HENT: Positive for congestion. Negative for dental problem, ear pain, nosebleeds, postnasal drip, rhinorrhea, sinus pressure, sneezing, sore throat and trouble swallowing.   Eyes: Negative for redness and itching.  Respiratory: Positive for cough, chest tightness and shortness of breath. Negative for wheezing.   Cardiovascular: Negative for palpitations and leg swelling.  Gastrointestinal: Negative for nausea and vomiting.  Genitourinary: Negative for dysuria.  Musculoskeletal: Negative for joint swelling.  Skin: Negative for rash.  Neurological: Negative for headaches.  Hematological: Does not bruise/bleed easily.  Psychiatric/Behavioral: Negative for dysphoric mood. The patient is not nervous/anxious.        Objective:   Physical Exam  Vitals reviewed. Constitutional: She is oriented to person, place, and time. She appears well-developed and well-nourished. No distress.  HENT:  Head: Normocephalic and atraumatic.  Right Ear: External ear normal.  Left Ear: External ear normal.  Mouth/Throat: Oropharynx is clear and moist. No oropharyngeal exudate.  Eyes: Conjunctivae and EOM are normal. Pupils are equal, round, and  reactive to light. Right eye exhibits no discharge. Left eye exhibits no discharge. No scleral icterus.  Neck: Normal range of motion. Neck supple. No JVD present. No tracheal deviation present. No thyromegaly present.  Cardiovascular: Normal rate, regular rhythm, normal heart sounds and intact distal pulses.  Exam reveals no gallop and no friction rub.   No murmur heard. Pulmonary/Chest: Effort normal. No respiratory distress. She has wheezes. She has no rales. She exhibits no tenderness.  Abdominal: Soft. Bowel sounds are normal. She exhibits no distension and no mass. There is no tenderness. There is no rebound and no guarding.  Musculoskeletal: Normal range of motion. She exhibits no edema and no tenderness.  Lymphadenopathy:    She has no cervical adenopathy.  Neurological: She is alert and oriented to person, place, and time. She has normal reflexes. No cranial nerve deficit. She exhibits normal muscle tone. Coordination normal.  Skin: Skin is warm and dry. No rash noted. She is not diaphoretic. No erythema. No pallor.  Psychiatric: She has a normal mood and affect. Her behavior is normal. Judgment and thought content  normal.          Assessment & Plan:

## 2013-10-18 NOTE — Patient Instructions (Signed)
Asthma appears in flare up; respect your desire to avoid prednisone/antibiotics riught now Continue current medications Go back on xolair when able IF worse, cal [place] Followup 3 months or sooner

## 2013-10-19 ENCOUNTER — Encounter (HOSPITAL_COMMUNITY): Payer: Self-pay

## 2013-10-21 ENCOUNTER — Encounter (HOSPITAL_COMMUNITY): Payer: Self-pay

## 2013-10-26 ENCOUNTER — Encounter (HOSPITAL_COMMUNITY)
Admission: RE | Admit: 2013-10-26 | Discharge: 2013-10-26 | Disposition: A | Discharge status: Home / Self Care | Payer: Self-pay | Source: Ambulatory Visit | Attending: Pulmonary Disease | Admitting: Pulmonary Disease

## 2013-10-28 ENCOUNTER — Encounter (HOSPITAL_COMMUNITY): Payer: Self-pay

## 2013-10-29 NOTE — Assessment & Plan Note (Signed)
Asthma appears in flare up; respect your desire to avoid prednisone/antibiotics riught now Continue current medications Go back on xolair when able IF worse, cal [place] Followup 3 months or sooner When approved, we can consider [id number]

## 2013-11-01 ENCOUNTER — Telehealth: Payer: Self-pay | Admitting: Internal Medicine

## 2013-11-01 NOTE — Telephone Encounter (Signed)
I called and spoke with pt. She report she is feeling better. She has not had xolair x 1 month since being sick. She has been feeling better for about 2 weeks. She wants to restart xoalir. Please advise if okay to schedule to come in for this? Thanks MR

## 2013-11-01 NOTE — Telephone Encounter (Signed)
I spoke with pt and is aware. She was transferred to our schedulers for appt. Nothing further needed

## 2013-11-01 NOTE — Telephone Encounter (Signed)
Ok to restart xolair  Dr. Kalman Shan, M.D., [facility].C.P Pulmonary and Critical Care Medicine Staff Physician  System [facility] Pager: [phone], If no answer or between  15:00h - 7:00h: call [phone]  11/01/2013 4:22 PM

## 2013-11-02 ENCOUNTER — Encounter (HOSPITAL_COMMUNITY): Admission: RE | Admit: 2013-11-02 | Payer: Self-pay | Source: Ambulatory Visit

## 2013-11-03 ENCOUNTER — Other Ambulatory Visit: Payer: Self-pay | Admitting: Internal Medicine

## 2013-11-04 ENCOUNTER — Encounter (HOSPITAL_COMMUNITY): Payer: Self-pay

## 2013-11-08 ENCOUNTER — Telehealth: Payer: Self-pay | Admitting: Internal Medicine

## 2013-11-08 ENCOUNTER — Telehealth: Payer: Self-pay

## 2013-11-08 MED ORDER — PANTOPRAZOLE SODIUM 40 MG PO TBEC: 40.0000 mg | DELAYED_RELEASE_TABLET | Freq: Every day | ORAL | Status: DC

## 2013-11-08 MED ORDER — FLUTICASONE-SALMETEROL 500-50 MCG/DOSE IN AEPB: 2.0000 | INHALATION_SPRAY | Freq: Two times a day (BID) | RESPIRATORY_TRACT | Status: DC

## 2013-11-08 NOTE — Telephone Encounter (Signed)
I called patient and left a message requesting a return call.   We received notice from her insurance company that 3 of her meds will no longer be covered and a change is needed. Nexium 40 mg will be changed to Pantoprazole 40 mg to take in the morning (this has been sent to her pharmacy). Also Symbicort 160/4.5 will no longer be covered. This has been changed to Advair Diskuss 500/50. Finally Estradiol 1 mg will not be covered and Dr Debby Bud would like patient to come in for an office visit to discuss this.

## 2013-11-08 NOTE — Telephone Encounter (Signed)
Pt. Wants to know if it's ok to get her xolair shots this week? Since more time has passed.( I read 2013/11/30 ph. Note.) Her last shot was 09/06/13. Please advise and I will call Mrs.Sarinana back. Thank you, Dimas Millin

## 2013-11-08 NOTE — Telephone Encounter (Signed)
I advised pt according to phone note 11/01/13 MR okay'd for her to restart xolair. She will schedule an appt to do so. Nothing further needed

## 2013-11-09 ENCOUNTER — Ambulatory Visit (INDEPENDENT_AMBULATORY_CARE_PROVIDER_SITE_OTHER): Payer: Medicare Other

## 2013-11-09 ENCOUNTER — Encounter (HOSPITAL_COMMUNITY): Payer: Self-pay

## 2013-11-09 ENCOUNTER — Other Ambulatory Visit: Payer: Self-pay | Admitting: Internal Medicine

## 2013-11-09 ENCOUNTER — Telehealth: Payer: Self-pay | Admitting: Internal Medicine

## 2013-11-09 DIAGNOSIS — J45909 Unspecified asthma, uncomplicated: Secondary | ICD-10-CM

## 2013-11-09 MED ORDER — OMALIZUMAB 150 MG ~~LOC~~ SOLR
300.0000 mg | Freq: Once | SUBCUTANEOUS | Status: AC
  Administered 2013-11-09: 300 mg via SUBCUTANEOUS

## 2013-11-09 NOTE — Telephone Encounter (Signed)
Pt states that her medication list is not correct. Symbicort 160 (2 puff BID) and Nexium 40mg  (daily) is not on list--should be added back to list. Per OV 06/2013 "continue Symbicort" Pt states that Protonix was D/C d/t intolerance--started on Nexium 40mg   Med list updated. Pt to contact PCP who sent in Advair refill to inform them to not fill her Pulm meds.

## 2013-11-09 NOTE — Telephone Encounter (Signed)
lmomtcb x1 for pt 

## 2013-11-09 NOTE — Telephone Encounter (Signed)
Pt is returned call.  [name] [name]

## 2013-11-10 ENCOUNTER — Ambulatory Visit: Payer: Medicare Other

## 2013-11-10 DIAGNOSIS — H1045 Other chronic allergic conjunctivitis: Secondary | ICD-10-CM | POA: Diagnosis not present

## 2013-11-10 DIAGNOSIS — H04129 Dry eye syndrome of unspecified lacrimal gland: Secondary | ICD-10-CM | POA: Diagnosis not present

## 2013-11-10 DIAGNOSIS — H43819 Vitreous degeneration, unspecified eye: Secondary | ICD-10-CM | POA: Diagnosis not present

## 2013-11-10 DIAGNOSIS — Z961 Presence of intraocular lens: Secondary | ICD-10-CM | POA: Diagnosis not present

## 2013-11-11 ENCOUNTER — Encounter (HOSPITAL_COMMUNITY): Payer: Self-pay

## 2013-11-11 DIAGNOSIS — J449 Chronic obstructive pulmonary disease, unspecified: Secondary | ICD-10-CM | POA: Insufficient documentation

## 2013-11-11 DIAGNOSIS — K219 Gastro-esophageal reflux disease without esophagitis: Secondary | ICD-10-CM | POA: Insufficient documentation

## 2013-11-11 DIAGNOSIS — Z5189 Encounter for other specified aftercare: Secondary | ICD-10-CM | POA: Insufficient documentation

## 2013-11-11 DIAGNOSIS — G4730 Sleep apnea, unspecified: Secondary | ICD-10-CM | POA: Insufficient documentation

## 2013-11-11 DIAGNOSIS — M810 Age-related osteoporosis without current pathological fracture: Secondary | ICD-10-CM | POA: Insufficient documentation

## 2013-11-11 DIAGNOSIS — J4489 Other specified chronic obstructive pulmonary disease: Secondary | ICD-10-CM | POA: Insufficient documentation

## 2013-11-16 ENCOUNTER — Encounter (HOSPITAL_COMMUNITY)
Admission: RE | Admit: 2013-11-16 | Discharge: 2013-11-16 | Disposition: A | Discharge status: Home / Self Care | Payer: Self-pay | Source: Ambulatory Visit | Attending: Pulmonary Disease | Admitting: Pulmonary Disease

## 2013-11-23 ENCOUNTER — Encounter (HOSPITAL_COMMUNITY)
Admission: RE | Admit: 2013-11-23 | Discharge: 2013-11-23 | Disposition: A | Discharge status: Home / Self Care | Payer: Self-pay | Source: Ambulatory Visit | Attending: Pulmonary Disease | Admitting: Pulmonary Disease

## 2013-11-25 ENCOUNTER — Encounter (HOSPITAL_COMMUNITY)
Admission: RE | Admit: 2013-11-25 | Discharge: 2013-11-25 | Disposition: A | Discharge status: Home / Self Care | Payer: Self-pay | Source: Ambulatory Visit | Attending: Pulmonary Disease | Admitting: Pulmonary Disease

## 2013-11-30 ENCOUNTER — Encounter (HOSPITAL_COMMUNITY)
Admission: RE | Admit: 2013-11-30 | Discharge: 2013-11-30 | Disposition: A | Discharge status: Home / Self Care | Payer: Self-pay | Source: Ambulatory Visit | Attending: Pulmonary Disease | Admitting: Pulmonary Disease

## 2013-12-01 ENCOUNTER — Ambulatory Visit (INDEPENDENT_AMBULATORY_CARE_PROVIDER_SITE_OTHER): Payer: Medicare Other | Admitting: Internal Medicine

## 2013-12-01 ENCOUNTER — Encounter: Payer: Self-pay | Admitting: Internal Medicine

## 2013-12-01 ENCOUNTER — Ambulatory Visit: Payer: Medicare Other | Admitting: Internal Medicine

## 2013-12-01 VITALS — BP 160/90 | HR 62 | Temp 98.4°F | Wt 163.0 lb

## 2013-12-01 DIAGNOSIS — I10 Essential (primary) hypertension: Secondary | ICD-10-CM

## 2013-12-01 MED ORDER — FUROSEMIDE 40 MG PO TABS: 40.0000 mg | ORAL_TABLET | Freq: Every day | ORAL | Status: DC

## 2013-12-01 MED ORDER — HYDROCODONE-ACETAMINOPHEN 7.5-325 MG PO TABS: 1.0000 | ORAL_TABLET | Freq: Four times a day (QID) | ORAL | Status: DC | PRN

## 2013-12-01 NOTE — Progress Notes (Signed)
Pre visit review using our clinic review tool, if applicable. No additional management support is needed unless otherwise documented below in the visit note. 

## 2013-12-01 NOTE — Progress Notes (Signed)
   Subjective:    Patient ID: [name] [name], female    DOB: 07-09-1941, 73 y.o.   MRN: [id number]  HPI Ms. Decuir reports that her SBP has been running in the 160-180 range. She has been asymptomatic.   PMH, FamHx and SocHx reviewed for any changes and relevance. Current Outpatient Prescriptions on File Prior to Visit  Medication Sig Dispense Refill  . ALPRAZolam (XANAX) 0.5 MG tablet TAKE 1 TABLET EVERY 6 HOURS AS NEEDED FOR ANXIETY  60 tablet  3  . amLODipine (NORVASC) 5 MG tablet Take 1 tablet (5 mg total) by mouth daily.  30 tablet  11  . budesonide-formoterol (SYMBICORT) 160-4.5 MCG/ACT inhaler Inhale 2 puffs into the lungs 2 (two) times daily.      . Cholecalciferol 50000 UNITS TABS Take 1 tablet by mouth once a week.  12 tablet  0  . CLARINEX 5 MG tablet TAKE 1 TABLET EVERY EVENING  30 tablet  5  . EPINEPHrine (EPIPEN) 0.3 mg/0.3 mL DEVI Inject 0.3 mLs (0.3 mg total) into the muscle once as needed. Use as directed for severe allergic reaction  1 Device  11  . esomeprazole (NEXIUM) 40 MG capsule Take 40 mg by mouth daily at 12 noon.      . hydrochlorothiazide (HYDRODIURIL) 25 MG tablet TAKE 1 TABLET EVERY DAY  30 tablet  5  . HYDROcodone-acetaminophen (NORCO) 7.5-325 MG per tablet Take 1 tablet by mouth every 6 (six) hours as needed for pain. MAY FILL ON OR AFTER 11/01/2013  90 tablet  0  . omalizumab (XOLAIR) 150 MG injection Inject 300 mg into the skin every 28 (twenty-eight) days.      Marland Kitchen PATANOL 0.1 % ophthalmic solution PLACE 2 DROPS INTO BOTH EYES 2 (TWO) TIMES DAILY.  5 mL  2  . predniSONE (DELTASONE) 10 MG tablet 2 mg. Takes 2-5mg  daily per pt      . RESTASIS 0.05 % ophthalmic emulsion PLACE 1 DROP INTO BOTH EYES 2 (TWO) TIMES DAILY. BMN  60 mL  2  . SINGULAIR 10 MG tablet TAKE 1 TABLET EVERY DAY  90 tablet  3  . XOPENEX 0.63 MG/3ML nebulizer solution Take 3 mLs (0.63 mg total) by nebulization every 6 (six) hours as needed for wheezing.  360 mL  2  . XOPENEX HFA 45 MCG/ACT inhaler  INHALE 2 PUFFS INTO THE LUNGS EVERY 4 (FOUR) HOURS AS NEEDED. SHORTNESS OF BREATH  15 g  1   No current facility-administered medications on file prior to visit.      Review of Systems System review is negative for any constitutional, cardiac, pulmonary, GI or neuro symptoms or complaints other than as described in the HPI.     Objective:   Physical Exam Filed Vitals:   12/01/13 1428  BP: 160/90  Pulse: 62  Temp: 98.4 F (36.9 C)   BP Readings from Last 3 Encounters:  12/01/13 160/90  10/18/13 128/74  09/20/13 142/90   Gen'l- WNWD woman in no distress HEENT- C&S clear, PERRLA Cor- 2+ radial, RRR Pulm - lungs are clear today. Neuro - normal       Assessment & Plan:

## 2013-12-01 NOTE — Patient Instructions (Signed)
Blood pressure - isolated systolic high blood pressure-top number. Plan Continue to take the amlodipine  Furosemide 40 mg once a day instead of the hydrochlorothiazide  It will take 10-14 days to know how well this will work to bring down your blood pressure.  

## 2013-12-02 ENCOUNTER — Ambulatory Visit: Payer: Medicare Other | Admitting: Internal Medicine

## 2013-12-02 ENCOUNTER — Encounter (HOSPITAL_COMMUNITY): Payer: Self-pay

## 2013-12-02 NOTE — Assessment & Plan Note (Signed)
Blood pressure - isolated systolic high blood pressure-top number. Plan Continue to take the amlodipine  Furosemide 40 mg once a day instead of the hydrochlorothiazide  It will take 10-14 days to know how well this will work to bring down your blood pressure.

## 2013-12-06 ENCOUNTER — Other Ambulatory Visit: Payer: Self-pay | Admitting: Internal Medicine

## 2013-12-07 ENCOUNTER — Ambulatory Visit (INDEPENDENT_AMBULATORY_CARE_PROVIDER_SITE_OTHER): Payer: Medicare Other

## 2013-12-07 ENCOUNTER — Encounter (HOSPITAL_COMMUNITY)
Admission: RE | Admit: 2013-12-07 | Discharge: 2013-12-07 | Disposition: A | Discharge status: Home / Self Care | Payer: Self-pay | Source: Ambulatory Visit | Attending: Pulmonary Disease | Admitting: Pulmonary Disease

## 2013-12-07 DIAGNOSIS — J45909 Unspecified asthma, uncomplicated: Secondary | ICD-10-CM | POA: Diagnosis not present

## 2013-12-08 MED ORDER — OMALIZUMAB 150 MG ~~LOC~~ SOLR
300.0000 mg | Freq: Once | SUBCUTANEOUS | Status: AC
  Administered 2013-12-08: 300 mg via SUBCUTANEOUS

## 2013-12-09 ENCOUNTER — Encounter (HOSPITAL_COMMUNITY): Payer: Self-pay

## 2013-12-14 ENCOUNTER — Encounter (HOSPITAL_COMMUNITY)
Admission: RE | Admit: 2013-12-14 | Discharge: 2013-12-14 | Disposition: A | Discharge status: Home / Self Care | Payer: Self-pay | Source: Ambulatory Visit | Attending: Pulmonary Disease | Admitting: Pulmonary Disease

## 2013-12-14 DIAGNOSIS — J449 Chronic obstructive pulmonary disease, unspecified: Secondary | ICD-10-CM | POA: Insufficient documentation

## 2013-12-14 DIAGNOSIS — Z5189 Encounter for other specified aftercare: Secondary | ICD-10-CM | POA: Insufficient documentation

## 2013-12-14 DIAGNOSIS — M810 Age-related osteoporosis without current pathological fracture: Secondary | ICD-10-CM | POA: Insufficient documentation

## 2013-12-14 DIAGNOSIS — J4489 Other specified chronic obstructive pulmonary disease: Secondary | ICD-10-CM | POA: Insufficient documentation

## 2013-12-14 DIAGNOSIS — G4730 Sleep apnea, unspecified: Secondary | ICD-10-CM | POA: Insufficient documentation

## 2013-12-14 DIAGNOSIS — K219 Gastro-esophageal reflux disease without esophagitis: Secondary | ICD-10-CM | POA: Insufficient documentation

## 2013-12-16 ENCOUNTER — Encounter (HOSPITAL_COMMUNITY)
Admission: RE | Admit: 2013-12-16 | Discharge: 2013-12-16 | Disposition: A | Discharge status: Home / Self Care | Payer: Self-pay | Source: Ambulatory Visit | Attending: Pulmonary Disease | Admitting: Pulmonary Disease

## 2013-12-21 ENCOUNTER — Encounter (HOSPITAL_COMMUNITY): Payer: Self-pay

## 2013-12-21 ENCOUNTER — Other Ambulatory Visit: Payer: Self-pay

## 2013-12-21 DIAGNOSIS — Z1231 Encounter for screening mammogram for malignant neoplasm of breast: Secondary | ICD-10-CM

## 2013-12-23 ENCOUNTER — Encounter (HOSPITAL_COMMUNITY): Payer: Self-pay

## 2013-12-28 ENCOUNTER — Encounter (HOSPITAL_COMMUNITY): Payer: Self-pay

## 2013-12-29 ENCOUNTER — Telehealth: Payer: Self-pay

## 2013-12-29 NOTE — Telephone Encounter (Signed)
I called patient and she states she is unable to take any of the covered meds in place of the Nexium and Symbicort. I placed the papers back in your folder.

## 2013-12-29 NOTE — Telephone Encounter (Signed)
Phone call from [place] with [employer] [phone] states she is following up on patient's prior authorization on Nexium and Symbicort. If you do not want to switch to what is covered Jeani Hawking needs dx code, what patient has failed and why the Nexium /Symbicort would be better for the patient.   Note: Omeprazole and Pantoprazole are covered meds ; also Advair breo asmanax and qvar are covered if patient has not failed these.

## 2013-12-29 NOTE — Telephone Encounter (Signed)
Ok to switch to formulary drugs: pantoprazole 40 mg q Am, and Advair HFA 115/21 2 puffs AM and HS

## 2013-12-30 ENCOUNTER — Telehealth: Payer: Self-pay

## 2013-12-30 ENCOUNTER — Encounter (HOSPITAL_COMMUNITY): Payer: Self-pay

## 2013-12-30 NOTE — Telephone Encounter (Signed)
I called patient and left a message for her to return my call. Per Dr Linda Hedges patient will need to go with formulary her insurance will cover will since Nexium is not covered. Omeprazole 40 mg  Each morning or Pantoprazole 40 mg. As far as the Symbicort per Dr Linda Hedges her pulmonary doctor is to determine what inhaler she should be on.

## 2013-12-30 NOTE — Telephone Encounter (Signed)
Glenard Haring with [employer] called [phone] regarding patient's prior authorization on a couple meds (see previous note). I was calling [employer] back to cancel these per Dr Linda Hedges and patient is aware (see previous note). I was transferred once and placed on hold. I could not hold any longer. [employer] may call back but we can disregard pa's.

## 2013-12-30 NOTE — Telephone Encounter (Signed)
Patient called back and has been advised and she states she will talk with Dr Fuller Plan

## 2014-01-02 ENCOUNTER — Other Ambulatory Visit: Payer: Self-pay | Admitting: Internal Medicine

## 2014-01-04 ENCOUNTER — Encounter (HOSPITAL_COMMUNITY): Payer: Self-pay

## 2014-01-05 ENCOUNTER — Telehealth: Payer: Self-pay | Admitting: Internal Medicine

## 2014-01-05 ENCOUNTER — Ambulatory Visit (INDEPENDENT_AMBULATORY_CARE_PROVIDER_SITE_OTHER): Payer: Medicare Other

## 2014-01-05 DIAGNOSIS — J45909 Unspecified asthma, uncomplicated: Secondary | ICD-10-CM

## 2014-01-06 ENCOUNTER — Encounter (HOSPITAL_COMMUNITY): Payer: Self-pay

## 2014-01-07 MED ORDER — OMALIZUMAB 150 MG ~~LOC~~ SOLR
300.0000 mg | Freq: Once | SUBCUTANEOUS | Status: AC
  Administered 2014-01-07: 300 mg via SUBCUTANEOUS

## 2014-01-11 ENCOUNTER — Encounter (HOSPITAL_COMMUNITY)
Admission: RE | Admit: 2014-01-11 | Discharge: 2014-01-11 | Disposition: A | Discharge status: Home / Self Care | Payer: Self-pay | Source: Ambulatory Visit | Attending: Pulmonary Disease | Admitting: Pulmonary Disease

## 2014-01-11 DIAGNOSIS — J449 Chronic obstructive pulmonary disease, unspecified: Secondary | ICD-10-CM | POA: Insufficient documentation

## 2014-01-11 DIAGNOSIS — K219 Gastro-esophageal reflux disease without esophagitis: Secondary | ICD-10-CM | POA: Insufficient documentation

## 2014-01-11 DIAGNOSIS — M810 Age-related osteoporosis without current pathological fracture: Secondary | ICD-10-CM | POA: Insufficient documentation

## 2014-01-11 DIAGNOSIS — G4730 Sleep apnea, unspecified: Secondary | ICD-10-CM | POA: Insufficient documentation

## 2014-01-11 DIAGNOSIS — J4489 Other specified chronic obstructive pulmonary disease: Secondary | ICD-10-CM | POA: Insufficient documentation

## 2014-01-11 DIAGNOSIS — Z5189 Encounter for other specified aftercare: Secondary | ICD-10-CM | POA: Insufficient documentation

## 2014-01-12 ENCOUNTER — Ambulatory Visit: Payer: Medicare Other

## 2014-01-13 ENCOUNTER — Encounter (HOSPITAL_COMMUNITY): Payer: Self-pay

## 2014-01-18 ENCOUNTER — Encounter (HOSPITAL_COMMUNITY)
Admission: RE | Admit: 2014-01-18 | Discharge: 2014-01-18 | Disposition: A | Discharge status: Home / Self Care | Payer: Self-pay | Source: Ambulatory Visit | Attending: Pulmonary Disease | Admitting: Pulmonary Disease

## 2014-01-20 ENCOUNTER — Encounter (HOSPITAL_COMMUNITY)
Admission: RE | Admit: 2014-01-20 | Discharge: 2014-01-20 | Disposition: A | Discharge status: Home / Self Care | Payer: Self-pay | Source: Ambulatory Visit | Attending: Pulmonary Disease | Admitting: Pulmonary Disease

## 2014-01-24 ENCOUNTER — Ambulatory Visit (INDEPENDENT_AMBULATORY_CARE_PROVIDER_SITE_OTHER): Payer: Medicare Other | Admitting: Internal Medicine

## 2014-01-24 ENCOUNTER — Encounter: Payer: Self-pay | Admitting: Internal Medicine

## 2014-01-24 VITALS — BP 160/82 | HR 60 | Ht 64.0 in | Wt 164.0 lb

## 2014-01-24 DIAGNOSIS — J45909 Unspecified asthma, uncomplicated: Secondary | ICD-10-CM

## 2014-01-24 DIAGNOSIS — J4550 Severe persistent asthma, uncomplicated: Secondary | ICD-10-CM

## 2014-01-24 NOTE — Patient Instructions (Addendum)
Asthma appears stable Continue current medications; symbicort, xolair, singulair, daily prednisone 2-5mg    - I will do prior authorization Keep sinus under control through Dr Constance Holster and saline spray  -would recommend you consider sinus surgery Constance Holster recommends Keep reflux under control with nexium and low glycemic diet  Followup IF worse, call us Followup 3 months or sooner

## 2014-01-24 NOTE — Progress Notes (Signed)
Subjective:    Patient ID: [name] [name], female    DOB: 1941/05/14, 73 y.o.   MRN: [id number]  HPI 1. ASTHMA - Baseline Chronic High Risk (multiple admits), Allergic, Moderate-Severe Persistent Asthma  - Allergies, gerd, sinus exacerbators.  - Frequent prednisone -> daily low dose prednisone since FEb 20112  - PFT 11/06/2010 - Mixed obstruction - restriction - fev1 1L/50%, Ratio 54, 24% BD response, TLC 3.6/78%, DLCO 16/75%  - May 2012: Switched asthma care to [name]  - June 2012 PFT - showed normalization with prednisone: PFts 04/18/2011 shows NORMALIZATION (fev1 1.8L/93%, ratio 68, 9% BD response, DLCO 86).  - August 2012: Fev1: 1.1lL/58% -> prednisone burst-> 1.09L/60%, Ratio 65 (no  - Nov 2012: Fev1 0.97L/51%, Ratio 60 -> pred burst  - Dec 2012: Fev1 0.7L/37%, Ratio 67%  - August 2014: FEV1  1.17 L/62%  2. Bronchiectasis, (mild RUL) seen on CT chest - April 2012 ( ABPA wu tests on prednisone in June 2012: asp antibody IgE asp fumigatus 0.24 ku/L which is borderline/equivocal, IgG for all all aspergiluus preciptins - NEGATIVE. 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 Allergic rhinitis and allergic asthma.  - 2000s: - per hx strongly skin test positive at Dr Neldon Mc office  - 2004 - trialed xolair - per hx effective but developed rash which she subjectively attributed to twice daily dosing (thogh chart review 2004-2005 states it was stopped due to subjective lack of improvement and an incidental rash resolution was noted in retrospect after stopping xolair)  - 2009: IgE 306 in July 2009. RAST positive for various grass, oak, elm, ragweed, plantain, lamb etc., s/p Allergy shot trial with Dr Annamaria Boots - stopped due to transport issues  - 2012: May - IgE 588.  - 2012: June - moved allergy MD to Dr Hardie Pulley  - 2013: Restart Xolair    4. Lung nodule, 6 mm, on CT scan. - April 2012. Possibly calcified. RUL (no prior for  comparison but a 07/17/2000 CT chest reports simillar size nodule in similar location). Repeat CT needed April 2013   5. Gastroesophageal reflux disease with hx of UGI scope showing Hiatal hernia (small)  - delayed gastric empyting 2010 March  6. History of sleep apnea.- non compliant with CPAP   7. Significant anxiety (also mood lability hx with prednisone, worsening anxiety with albuterol)    8. Recurrent AE-ASthma  - Admitted 4/23-4/30/12  - OPD AE asthma Rx: July 2012, august 2012, Nov 2012  - Office treatment with Levaquin and prednisone-November 2013 -  Levaquin and prednisone -January 2014   #Obesity  - 192# - nov 2013. STart low glycemic diet  - 178#  - 03/10/2013. Body mass index is 31.03 kg/(m^2). - 175# - 04/08/13 - 168# - 05/12/2013 with Body mass index is 28.93 kg/(m^2). - 167# on 07/07/2013 with Body mass index is 28.65 kg/(m^2). -  154# on 10/18/2013 wuth Body mass index is 26.42 kg/(m^2).      OV 10/18/2013    Chief Complaint  Patient presents with  . Follow-up    Pt c/o SOB, chest tightness, congestion, prod cough with yellow mucous X1wk.   Follow severe  persistent asthma on daily prednisone between 2 and 5 mg  After last visit she did go see Dr. Brayton Layman crafted end of September 2014 at [facility]. Extensive evaluation was done and the findings are documented below. On the day of the  visit she was found to be in active asthma with high elevated exam nitric oxide. ANCA  test was negative. Aspergillus flavus and night he was positive. CT scan only showed bronchitis. Overall low compliance was considered as a reason for her severe persistent asthma. However ENT evaluation was recommended but since she is already seeing a local ENT specialist in [place] she deferred that. A polysomnogram was recommended but because she's lost significant weight she deferred that. Dr. Blenda Nicely consider lung biopsy versus new research study against eos  in asthma versus CellCept but  patient is not interested in any of this  08/04/13 - [facility] eval - Asp Flavsu - Positive. Asp [place] - Positive (2012 IgE 588) . Asp Fumigatus - negative, ANCA - negativ/ Exhaled NO 37 and HIGH,, LOW VIT  D - 9.  CT chest - Co art cakcufucatuib ub ?CAD. SMall lung nodule - unchanged since April 2103. Imp Bronchitis   Today overall she says that her asthma isn't flareup but it's better than 10 days ago. She was treated with ciprofloxacin for urinary tract infection 10 days ago she also self upper prednisone dose and so she is better. She does not want another prednisone burst or antibiotic regimen. She has missed Xolarir; last end oct 2014. Wil hold off today due to wheezing  Asthma appears in flare up; respect your desire to avoid prednisone/antibiotics riught now Continue current medications Go back on xolair when able IF worse, cal [place] Followup 3 months or sooner  OV 01/24/2014  Chief Complaint  Patient presents with  . Follow-up    pt states she was really SOB earlier today but this has improved as the day went on.    Fu severe persistent asthma on Symbicort, Xolair, Singulair, daily prednisone between 2 and 5 mg. Currently asthma stable. Earlier in the day she had increased wheezing but this is resolved. She says is compliant with her medications. Her insurance is no longer approving Symbicort and she needs a prior authorization which I can do.  Past medical history reviewed: Hypertension is worse. Private physician Dr. Linda Hedges is retiring and she will hopefully start seeing Dr. Asa Lente   Review of Systems  Constitutional: Negative for fever and unexpected weight change.  HENT: Negative for congestion, dental problem, ear pain, nosebleeds, postnasal drip, rhinorrhea, sinus pressure, sneezing, sore throat and trouble swallowing.   Eyes: Negative for redness and itching.  Respiratory: Positive for shortness of breath. Negative for cough, chest tightness and wheezing.   Cardiovascular:  Negative for palpitations and leg swelling.  Gastrointestinal: Negative for nausea and vomiting.  Genitourinary: Negative for dysuria.  Musculoskeletal: Negative for joint swelling.  Skin: Negative for rash.  Neurological: Negative for headaches.  Hematological: Does not bruise/bleed easily.  Psychiatric/Behavioral: Negative for dysphoric mood. The patient is not nervous/anxious.    Current outpatient prescriptions:ALPRAZolam (XANAX) 0.5 MG tablet, TAKE 1 TABLET EVERY 6 HOURS AS NEEDED FOR ANXIETY, Disp: 60 tablet, Rfl: 3;  budesonide-formoterol (SYMBICORT) 160-4.5 MCG/ACT inhaler, Inhale 2 puffs into the lungs 2 (two) times daily., Disp: , Rfl: ;  CLARINEX 5 MG tablet, TAKE 1 TABLET EVERY EVENING, Disp: 30 tablet, Rfl: 5 EPINEPHrine (EPIPEN) 0.3 mg/0.3 mL DEVI, Inject 0.3 mLs (0.3 mg total) into the muscle once as needed. Use as directed for severe allergic reaction, Disp: 1 Device, Rfl: 11;  HYDROcodone-acetaminophen (NORCO) 7.5-325 MG per tablet, Take 1 tablet by mouth every 6 (six) hours as needed. MAY FILL ON OR AFTER 01/30/2014, Disp: 90 tablet, Rfl: 0  NEXIUM 40 MG capsule, TAKE 1 CAPSULE (40 MG TOTAL) BY MOUTH DAILY BEFORE BREAKFAST., Disp: 30 capsule, Rfl: 11;  NORVASC 5 MG tablet, TAKE 1 TABLET (5 MG TOTAL) BY MOUTH DAILY., Disp: 30 tablet, Rfl: 9;  omalizumab (XOLAIR) 150 MG injection, Inject 300 mg into the skin every 28 (twenty-eight) days., Disp: , Rfl: ;  PATANOL 0.1 % ophthalmic solution, PLACE 2 DROPS INTO BOTH EYES 2 (TWO) TIMES DAILY., Disp: 5 mL, Rfl: 2 predniSONE (DELTASONE) 10 MG tablet, 2 mg. Takes 2-5mg  daily per pt, Disp: , Rfl: ;  RESTASIS 0.05 % ophthalmic emulsion, PLACE 1 DROP INTO BOTH EYES 2 (TWO) TIMES DAILY. BMN, Disp: 60 mL, Rfl: 2;  SINGULAIR 10 MG tablet, TAKE 1 TABLET EVERY DAY, Disp: 90 tablet, Rfl: 3;  Vitamin D, Ergocalciferol, (DRISDOL) 50000 UNITS CAPS capsule, TAKE 1 CAPSULE WEEKLY, Disp: 12 capsule, Rfl: 0 XOPENEX 0.63 MG/3ML nebulizer solution, Take 3 mLs  (0.63 mg total) by nebulization every 6 (six) hours as needed for wheezing., Disp: 360 mL, Rfl: 2;  XOPENEX HFA 45 MCG/ACT inhaler, INHALE 2 PUFFS INTO THE LUNGS EVERY 4 (FOUR) HOURS AS NEEDED. SHORTNESS OF BREATH, Disp: 15 g, Rfl: 1;  furosemide (LASIX) 40 MG tablet, Take 1 tablet (40 mg total) by mouth daily., Disp: 30 tablet, Rfl: 3     Objective:   Physical Exam   Physical Exam  Vitals reviewed. Constitutional: She is oriented to person, place, and time. She appears well-developed and well-nourished. No distress.  HENT:  Head: Normocephalic and atraumatic.  Right Ear: External ear normal.  Left Ear: External ear normal.  Mouth/Throat: Oropharynx is clear and moist. No oropharyngeal exudate.  Eyes: Conjunctivae and EOM are normal. Pupils are equal, round, and reactive to light. Right eye exhibits no discharge. Left eye exhibits no discharge. No scleral icterus.  Neck: Normal range of motion. Neck supple. No JVD present. No tracheal deviation present. No thyromegaly present.  Cardiovascular: Normal rate, regular rhythm, normal heart sounds and intact distal pulses.  Exam reveals no gallop and no friction rub.   No murmur heard. Pulmonary/Chest: Effort normal. No respiratory distress. She has wheezes but is rare and diminished. She has no rales. She exhibits no tenderness.  Abdominal: Soft. Bowel sounds are normal. She exhibits no distension and no mass. There is no tenderness. There is no rebound and no guarding.  Musculoskeletal: Normal range of motion. She exhibits no edema and no tenderness.  Lymphadenopathy:    She has no cervical adenopathy.  Neurological: She is alert and oriented to person, place, and time. She has normal reflexes. No cranial nerve deficit. She exhibits normal muscle tone. Coordination normal.  Skin: Skin is warm and dry. No rash noted. She is not diaphoretic. No erythema. No pallor.  Psychiatric: She has a normal mood and affect. Her behavior is normal. Judgment  and thought content normal.        Assessment & Plan:

## 2014-01-25 ENCOUNTER — Encounter (HOSPITAL_COMMUNITY): Payer: Self-pay

## 2014-01-27 ENCOUNTER — Encounter (HOSPITAL_COMMUNITY)
Admission: RE | Admit: 2014-01-27 | Discharge: 2014-01-27 | Disposition: A | Discharge status: Home / Self Care | Payer: Self-pay | Source: Ambulatory Visit | Attending: Pulmonary Disease | Admitting: Pulmonary Disease

## 2014-01-28 ENCOUNTER — Other Ambulatory Visit: Payer: Self-pay | Admitting: Internal Medicine

## 2014-01-30 NOTE — Assessment & Plan Note (Signed)
Asthma appears stable Continue current medications; symbicort, xolair, singulair, daily prednisone 2-5mg    - I will do prior authorization Keep sinus under control through Dr Constance Holster and saline spray  -would recommend you consider sinus surgery Constance Holster recommends Keep reflux under control with nexium and low glycemic diet  Followup IF worse, call us Followup 3 months or soone

## 2014-02-01 ENCOUNTER — Encounter (HOSPITAL_COMMUNITY)
Admission: RE | Admit: 2014-02-01 | Discharge: 2014-02-01 | Disposition: A | Discharge status: Home / Self Care | Payer: Self-pay | Source: Ambulatory Visit | Attending: Pulmonary Disease | Admitting: Pulmonary Disease

## 2014-02-02 ENCOUNTER — Ambulatory Visit (INDEPENDENT_AMBULATORY_CARE_PROVIDER_SITE_OTHER): Payer: Medicare Other | Admitting: Internal Medicine

## 2014-02-02 ENCOUNTER — Ambulatory Visit (INDEPENDENT_AMBULATORY_CARE_PROVIDER_SITE_OTHER): Payer: Medicare Other

## 2014-02-02 ENCOUNTER — Telehealth: Payer: Self-pay

## 2014-02-02 ENCOUNTER — Encounter: Payer: Self-pay | Admitting: Internal Medicine

## 2014-02-02 VITALS — BP 142/78 | HR 60 | Temp 98.7°F | Wt 164.5 lb

## 2014-02-02 DIAGNOSIS — M19049 Primary osteoarthritis, unspecified hand: Secondary | ICD-10-CM | POA: Diagnosis not present

## 2014-02-02 DIAGNOSIS — I10 Essential (primary) hypertension: Secondary | ICD-10-CM

## 2014-02-02 DIAGNOSIS — J45909 Unspecified asthma, uncomplicated: Secondary | ICD-10-CM

## 2014-02-02 DIAGNOSIS — J4550 Severe persistent asthma, uncomplicated: Secondary | ICD-10-CM

## 2014-02-02 MED ORDER — HYDROCODONE-ACETAMINOPHEN 7.5-325 MG PO TABS: 1.0000 | ORAL_TABLET | Freq: Four times a day (QID) | ORAL | Status: DC | PRN

## 2014-02-02 NOTE — Telephone Encounter (Signed)
ok 

## 2014-02-02 NOTE — Progress Notes (Signed)
   Subjective:    Patient ID: [name] [name], female    DOB: 05-18-1941, 73 y.o.   MRN: [id number]  HPI [name] presents for follow up of BP- she was switched to furosemide but did not tolerate due to increased urinary frequency and resumed HCTZ. Her BP is well controlled.   She is c/o left hand at the base of the thumb., It is intermittent   PMH, FamHx and SocHx reviewed for any changes and relevance.  Current Outpatient Prescriptions on File Prior to Visit  Medication Sig Dispense Refill  . ALPRAZolam (XANAX) 0.5 MG tablet TAKE 1 TABLET EVERY 6 HOURS AS NEEDED FOR ANXIETY  60 tablet  3  . budesonide-formoterol (SYMBICORT) 160-4.5 MCG/ACT inhaler Inhale 2 puffs into the lungs 2 (two) times daily.      Marland Kitchen CLARINEX 5 MG tablet TAKE 1 TABLET EVERY EVENING  30 tablet  5  . EPINEPHrine (EPIPEN) 0.3 mg/0.3 mL DEVI Inject 0.3 mLs (0.3 mg total) into the muscle once as needed. Use as directed for severe allergic reaction  1 Device  11  . HYDROcodone-acetaminophen (NORCO) 7.5-325 MG per tablet Take 1 tablet by mouth every 6 (six) hours as needed. MAY FILL ON OR AFTER 01/30/2014  90 tablet  0  . NEXIUM 40 MG capsule TAKE 1 CAPSULE (40 MG TOTAL) BY MOUTH DAILY BEFORE BREAKFAST.  30 capsule  11  . NORVASC 5 MG tablet TAKE 1 TABLET (5 MG TOTAL) BY MOUTH DAILY.  30 tablet  9  . omalizumab (XOLAIR) 150 MG injection Inject 300 mg into the skin every 28 (twenty-eight) days.      Marland Kitchen PATANOL 0.1 % ophthalmic solution PLACE 2 DROPS INTO BOTH EYES 2 (TWO) TIMES DAILY.  5 mL  2  . predniSONE (DELTASONE) 10 MG tablet 2 mg. Takes 2-5mg  daily per pt      . RESTASIS 0.05 % ophthalmic emulsion PLACE 1 DROP INTO BOTH EYES 2 (TWO) TIMES DAILY. BMN  60 mL  2  . SINGULAIR 10 MG tablet TAKE 1 TABLET EVERY DAY  90 tablet  3  . Vitamin D, Ergocalciferol, (DRISDOL) 50000 UNITS CAPS capsule TAKE 1 CAPSULE WEEKLY  12 capsule  0  . XOPENEX 0.63 MG/3ML nebulizer solution Take 3 mLs (0.63 mg total) by nebulization every 6 (six) hours  as needed for wheezing.  360 mL  2  . XOPENEX HFA 45 MCG/ACT inhaler INHALE 2 PUFFS INTO THE LUNGS EVERY 4 (FOUR) HOURS AS NEEDED. SHORTNESS OF BREATH  15 g  1  . furosemide (LASIX) 40 MG tablet Take 1 tablet (40 mg total) by mouth daily.  30 tablet  3   No current facility-administered medications on file prior to visit.       Review of Systems System review is negative for any constitutional, cardiac, pulmonary, GI or neuro symptoms or complaints other than as described in the HPI.     Objective:   Physical Exam Filed Vitals:   02/02/14 0850  BP: 142/78  Pulse: 60  Temp: 98.7 F (37.1 C)   gen'l- WNWD woman in no distress Cor - RRR Neuro - A&O x 3 MSK - very tender at the left 1st MCP joint.       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   Plan Discussed mechanism of disease with the patient  Rub of choice, heat and NSAID prn

## 2014-02-02 NOTE — Progress Notes (Signed)
Pre visit review using our clinic review tool, if applicable. No additional management support is needed unless otherwise documented below in the visit note. 

## 2014-02-02 NOTE — Telephone Encounter (Signed)
This patient has requested to be transferred to [facility] as a new patient after Dr.Norins retires.  Dr.Leschber has reached her 50 patient transfer max.  Can this patient be a Dr.Leschber new pt despite this?   Thanks!

## 2014-02-03 ENCOUNTER — Encounter (HOSPITAL_COMMUNITY)
Admission: RE | Admit: 2014-02-03 | Discharge: 2014-02-03 | Disposition: A | Discharge status: Home / Self Care | Payer: Self-pay | Source: Ambulatory Visit | Attending: Pulmonary Disease | Admitting: Pulmonary Disease

## 2014-02-04 MED ORDER — OMALIZUMAB 150 MG ~~LOC~~ SOLR
300.0000 mg | Freq: Once | SUBCUTANEOUS | Status: AC
  Administered 2014-02-04: 300 mg via SUBCUTANEOUS

## 2014-02-05 NOTE — Assessment & Plan Note (Signed)
BP Readings from Last 3 Encounters:  02/02/14 142/78  01/24/14 160/82  12/01/13 160/90   Blood pressure is controlled at today's visit patient having resumed HCTZ  Plan  Continue present medication.

## 2014-02-08 ENCOUNTER — Encounter (HOSPITAL_COMMUNITY)
Admission: RE | Admit: 2014-02-08 | Discharge: 2014-02-08 | Disposition: A | Discharge status: Home / Self Care | Payer: Self-pay | Source: Ambulatory Visit | Attending: Pulmonary Disease | Admitting: Pulmonary Disease

## 2014-02-10 ENCOUNTER — Encounter (HOSPITAL_COMMUNITY)
Admission: RE | Admit: 2014-02-10 | Discharge: 2014-02-10 | Disposition: A | Discharge status: Home / Self Care | Payer: Self-pay | Source: Ambulatory Visit | Attending: Pulmonary Disease | Admitting: Pulmonary Disease

## 2014-02-10 DIAGNOSIS — J4489 Other specified chronic obstructive pulmonary disease: Secondary | ICD-10-CM | POA: Insufficient documentation

## 2014-02-10 DIAGNOSIS — G4730 Sleep apnea, unspecified: Secondary | ICD-10-CM | POA: Insufficient documentation

## 2014-02-10 DIAGNOSIS — K219 Gastro-esophageal reflux disease without esophagitis: Secondary | ICD-10-CM | POA: Insufficient documentation

## 2014-02-10 DIAGNOSIS — J449 Chronic obstructive pulmonary disease, unspecified: Secondary | ICD-10-CM | POA: Insufficient documentation

## 2014-02-10 DIAGNOSIS — M810 Age-related osteoporosis without current pathological fracture: Secondary | ICD-10-CM | POA: Insufficient documentation

## 2014-02-10 DIAGNOSIS — Z5189 Encounter for other specified aftercare: Secondary | ICD-10-CM | POA: Insufficient documentation

## 2014-02-15 ENCOUNTER — Encounter (HOSPITAL_COMMUNITY): Payer: Self-pay

## 2014-02-17 ENCOUNTER — Encounter (HOSPITAL_COMMUNITY): Payer: Self-pay

## 2014-02-22 ENCOUNTER — Encounter (HOSPITAL_COMMUNITY)
Admission: RE | Admit: 2014-02-22 | Discharge: 2014-02-22 | Disposition: A | Discharge status: Home / Self Care | Payer: Self-pay | Source: Ambulatory Visit | Attending: Pulmonary Disease | Admitting: Pulmonary Disease

## 2014-02-24 ENCOUNTER — Encounter (HOSPITAL_COMMUNITY)
Admission: RE | Admit: 2014-02-24 | Discharge: 2014-02-24 | Disposition: A | Discharge status: Home / Self Care | Payer: Self-pay | Source: Ambulatory Visit | Attending: Pulmonary Disease | Admitting: Pulmonary Disease

## 2014-03-01 ENCOUNTER — Encounter (HOSPITAL_COMMUNITY): Payer: Self-pay

## 2014-03-03 ENCOUNTER — Encounter (HOSPITAL_COMMUNITY)
Admission: RE | Admit: 2014-03-03 | Discharge: 2014-03-03 | Disposition: A | Discharge status: Home / Self Care | Payer: Self-pay | Source: Ambulatory Visit | Attending: Pulmonary Disease | Admitting: Pulmonary Disease

## 2014-03-08 ENCOUNTER — Encounter: Payer: Self-pay | Admitting: Gastroenterology

## 2014-03-08 ENCOUNTER — Encounter (HOSPITAL_COMMUNITY)
Admission: RE | Admit: 2014-03-08 | Discharge: 2014-03-08 | Disposition: A | Discharge status: Home / Self Care | Payer: Self-pay | Source: Ambulatory Visit | Attending: Pulmonary Disease | Admitting: Pulmonary Disease

## 2014-03-08 ENCOUNTER — Ambulatory Visit (INDEPENDENT_AMBULATORY_CARE_PROVIDER_SITE_OTHER): Payer: Medicare Other

## 2014-03-08 DIAGNOSIS — J45909 Unspecified asthma, uncomplicated: Secondary | ICD-10-CM

## 2014-03-09 ENCOUNTER — Ambulatory Visit: Payer: Medicare Other

## 2014-03-09 MED ORDER — OMALIZUMAB 150 MG ~~LOC~~ SOLR
300.0000 mg | Freq: Once | SUBCUTANEOUS | Status: AC
  Administered 2014-03-09: 300 mg via SUBCUTANEOUS

## 2014-03-10 ENCOUNTER — Encounter (HOSPITAL_COMMUNITY)
Admission: RE | Admit: 2014-03-10 | Discharge: 2014-03-10 | Disposition: A | Discharge status: Home / Self Care | Payer: Self-pay | Source: Ambulatory Visit | Attending: Pulmonary Disease | Admitting: Pulmonary Disease

## 2014-03-11 ENCOUNTER — Ambulatory Visit (INDEPENDENT_AMBULATORY_CARE_PROVIDER_SITE_OTHER): Payer: Medicare Other | Admitting: Gastroenterology

## 2014-03-11 ENCOUNTER — Encounter: Payer: Self-pay | Admitting: Gastroenterology

## 2014-03-11 VITALS — BP 204/80 | HR 84 | Ht 64.0 in | Wt 164.6 lb

## 2014-03-11 DIAGNOSIS — K219 Gastro-esophageal reflux disease without esophagitis: Secondary | ICD-10-CM

## 2014-03-11 DIAGNOSIS — Z1211 Encounter for screening for malignant neoplasm of colon: Secondary | ICD-10-CM | POA: Diagnosis not present

## 2014-03-11 MED ORDER — ESOMEPRAZOLE MAGNESIUM 40 MG PO CPDR: 40.0000 mg | DELAYED_RELEASE_CAPSULE | Freq: Every day | ORAL | Status: DC

## 2014-03-11 MED ORDER — MOVIPREP 100 G PO SOLR: 1.0000 | Freq: Once | ORAL | Status: DC

## 2014-03-11 NOTE — Progress Notes (Signed)
Reviewed and agree with management plan.  Sharlett Lienemann T. Benn Tarver, MD FACG 

## 2014-03-11 NOTE — Patient Instructions (Addendum)
You have been scheduled for a colonoscopy with at [facility]. Please follow written instructions given to you at your visit today.  Please pick up your prep kit at the pharmacy within the next 1-3 days. If you use inhalers (even only as needed), please bring them with you on the day of your procedure. Your physician has requested that you go to www.startemmi.com and enter the access code given to you at your visit today. This web site gives a general overview about your procedure. However, you should still follow specific instructions given to you by our office regarding your preparation for the procedure.  We have given you samples of the following medication to take: Nexium 40 mg, please take one capsule by mouth once daily  Prescription for Nexium was sent to your pharmacy

## 2014-03-11 NOTE — Progress Notes (Signed)
03/11/2014 [name] [name] [id number] 04/10/1941   History of Present Illness:  This is a pleasant 73-year-old female who is known to Dr. Stark for treatment of her GERD and for previous colonoscopy in the past. She presents to our office today because she is in need of her Nexium medication refills. She's been out of the medication now for couple of weeks. She says that her insurance company is now denying the medication although she has been on it for quite some time. She has been working with the insurance company to try to get it covered, however. She says that since she has been out of the medication she has had some discomfort in her upper abdomen and recurrent issues with her reflux as suspected. Also with a couple of episodes of nausea and vomiting as well, which is now resolved. She says that prior to running out of her medication her symptoms were well-controlled; she had no complaints or issues.  She would like to schedule her colonoscopy while she is here today as well. She was due for recall colonoscopy in April 2014, however, last year her son had a stroke and she was not able to get it done at that time. Her last colonoscopy was in April 2004 at which time she had a couple of polyps removed, but they were hyperplastic on pathology.  Just of note, her blood pressure was very high at her visit today. I rechecked it personally and it was 192/88. She states that she took her blood pressure medicine just prior to coming to her visit today and was rushing to get here. She says her blood pressure is normally not that high; she checks it at home. Also, she has a lot of issues with asthma and COPD. At one point she was on oxygen at night time, but she took herself off of that approximately one year ago.   Current Medications, Allergies, Past Medical History, Past Surgical History, Family History and Social History were reviewed in [facility] electronic medical record.   Physical Exam: BP 204/80   Pulse 84  Ht 5' 4" (1.626 m)  Wt 164 lb 9.6 oz (74.662 kg)  BMI 28.24 kg/m2 General: Well developed black female in no acute distress Head: Normocephalic and atraumatic Eyes:  Sclerae anicteric, conjunctiva pink  Ears: Normal auditory acuity Lungs: Wheezing heard B/L. Heart: Regular rate and rhythm Abdomen: Soft, non-distended.  Normal bowel sounds.  Non-tender. Rectal:  Deferred.  Will be done at the time of colonoscopy. Musculoskeletal: Symmetrical with no gross deformities  Extremities: No edema  Neurological: Alert oriented x 4, grossly nonfocal Psychological:  Alert and cooperative. Normal mood and affect  Assessment and Recommendations: -Screening colonoscopy:  Last colonoscopy 02/2003 with polyps but were hyperplastic.  Will schedule at [facility] with Dr. Stark.  She was previously on O2 at home at night, but took herself off of it over a year ago (pulmonology is aware apparently).  The risks, benefits, and alternatives were discussed with the patient and she consents to proceed.  -GERD:  Will give her samples of Nexium 40 mg and will send over a new prescription as well, however, currently her insurance company is denying the medication now and she is working to get it covered.  She asked me to document the case number from her insurance company in case we need to refer to it.  That case number is [id number].    

## 2014-03-15 ENCOUNTER — Encounter (HOSPITAL_COMMUNITY)
Admission: RE | Admit: 2014-03-15 | Discharge: 2014-03-15 | Disposition: A | Discharge status: Home / Self Care | Payer: Self-pay | Source: Ambulatory Visit | Attending: Pulmonary Disease | Admitting: Pulmonary Disease

## 2014-03-15 DIAGNOSIS — J4489 Other specified chronic obstructive pulmonary disease: Secondary | ICD-10-CM | POA: Insufficient documentation

## 2014-03-15 DIAGNOSIS — Z5189 Encounter for other specified aftercare: Secondary | ICD-10-CM | POA: Insufficient documentation

## 2014-03-15 DIAGNOSIS — G4730 Sleep apnea, unspecified: Secondary | ICD-10-CM | POA: Insufficient documentation

## 2014-03-15 DIAGNOSIS — J449 Chronic obstructive pulmonary disease, unspecified: Secondary | ICD-10-CM | POA: Insufficient documentation

## 2014-03-15 DIAGNOSIS — M810 Age-related osteoporosis without current pathological fracture: Secondary | ICD-10-CM | POA: Insufficient documentation

## 2014-03-15 DIAGNOSIS — K219 Gastro-esophageal reflux disease without esophagitis: Secondary | ICD-10-CM | POA: Insufficient documentation

## 2014-03-16 ENCOUNTER — Encounter (HOSPITAL_COMMUNITY): Payer: Self-pay | Admitting: Pharmacy Technician

## 2014-03-17 ENCOUNTER — Encounter (HOSPITAL_COMMUNITY): Payer: Self-pay

## 2014-03-18 ENCOUNTER — Encounter (HOSPITAL_COMMUNITY): Payer: Self-pay | Admitting: *Deleted

## 2014-03-22 ENCOUNTER — Encounter (HOSPITAL_COMMUNITY)
Admission: RE | Admit: 2014-03-22 | Discharge: 2014-03-22 | Disposition: A | Discharge status: Home / Self Care | Payer: Self-pay | Source: Ambulatory Visit | Attending: Pulmonary Disease | Admitting: Pulmonary Disease

## 2014-03-24 ENCOUNTER — Encounter (HOSPITAL_COMMUNITY)
Admission: RE | Admit: 2014-03-24 | Discharge: 2014-03-24 | Disposition: A | Discharge status: Home / Self Care | Payer: Self-pay | Source: Ambulatory Visit | Attending: Pulmonary Disease | Admitting: Pulmonary Disease

## 2014-03-24 NOTE — Telephone Encounter (Signed)
error 

## 2014-03-25 ENCOUNTER — Telehealth: Payer: Self-pay | Admitting: Internal Medicine

## 2014-03-25 MED ORDER — PREDNISONE 1 MG PO TABS: ORAL_TABLET | ORAL | Status: DC

## 2014-03-25 NOTE — Telephone Encounter (Signed)
Spoke with pt. She needed her low dose prednisone RX sent in. I advised will do so. Nothing further needed

## 2014-03-29 ENCOUNTER — Ambulatory Visit (HOSPITAL_COMMUNITY)
Admission: RE | Admit: 2014-03-29 | Discharge: 2014-03-29 | Disposition: A | Discharge status: Home / Self Care | Payer: Medicare Other | Source: Ambulatory Visit | Attending: Gastroenterology | Admitting: Gastroenterology

## 2014-03-29 ENCOUNTER — Encounter (HOSPITAL_COMMUNITY): Payer: Self-pay

## 2014-03-29 ENCOUNTER — Encounter (HOSPITAL_COMMUNITY)
Admission: RE | Disposition: A | Discharge status: Home / Self Care | Payer: Self-pay | Source: Ambulatory Visit | Attending: Gastroenterology

## 2014-03-29 ENCOUNTER — Encounter (HOSPITAL_COMMUNITY): Payer: Medicare Other | Admitting: Anesthesiology

## 2014-03-29 ENCOUNTER — Encounter (HOSPITAL_COMMUNITY): Payer: Self-pay | Admitting: *Deleted

## 2014-03-29 ENCOUNTER — Ambulatory Visit (HOSPITAL_COMMUNITY): Payer: Medicare Other | Admitting: Anesthesiology

## 2014-03-29 DIAGNOSIS — G4730 Sleep apnea, unspecified: Secondary | ICD-10-CM | POA: Insufficient documentation

## 2014-03-29 DIAGNOSIS — D126 Benign neoplasm of colon, unspecified: Secondary | ICD-10-CM | POA: Diagnosis not present

## 2014-03-29 DIAGNOSIS — K219 Gastro-esophageal reflux disease without esophagitis: Secondary | ICD-10-CM | POA: Insufficient documentation

## 2014-03-29 DIAGNOSIS — K5730 Diverticulosis of large intestine without perforation or abscess without bleeding: Secondary | ICD-10-CM | POA: Insufficient documentation

## 2014-03-29 DIAGNOSIS — J449 Chronic obstructive pulmonary disease, unspecified: Secondary | ICD-10-CM | POA: Insufficient documentation

## 2014-03-29 DIAGNOSIS — J4489 Other specified chronic obstructive pulmonary disease: Secondary | ICD-10-CM | POA: Insufficient documentation

## 2014-03-29 DIAGNOSIS — Z87891 Personal history of nicotine dependence: Secondary | ICD-10-CM | POA: Diagnosis not present

## 2014-03-29 DIAGNOSIS — Z1211 Encounter for screening for malignant neoplasm of colon: Secondary | ICD-10-CM | POA: Diagnosis not present

## 2014-03-29 DIAGNOSIS — I10 Essential (primary) hypertension: Secondary | ICD-10-CM | POA: Insufficient documentation

## 2014-03-29 HISTORY — DX: Benign neoplasm of colon, unspecified: D12.6

## 2014-03-29 HISTORY — PX: COLONOSCOPY WITH PROPOFOL: SHX5780

## 2014-03-29 SURGERY — COLONOSCOPY WITH PROPOFOL
Anesthesia: Monitor Anesthesia Care

## 2014-03-29 MED ORDER — PROPOFOL 10 MG/ML IV BOLUS
INTRAVENOUS | Status: AC
  Filled 2014-03-29: qty 20

## 2014-03-29 MED ORDER — FENTANYL CITRATE 0.05 MG/ML IJ SOLN
INTRAMUSCULAR | Status: DC | PRN
  Administered 2014-03-29 (×2): 50 ug via INTRAVENOUS

## 2014-03-29 MED ORDER — SODIUM CHLORIDE 0.9 % IV SOLN: INTRAVENOUS | Status: DC

## 2014-03-29 MED ORDER — PROPOFOL INFUSION 10 MG/ML OPTIME
INTRAVENOUS | Status: DC | PRN
  Administered 2014-03-29: 140 ug/kg/min via INTRAVENOUS

## 2014-03-29 MED ORDER — FENTANYL CITRATE 0.05 MG/ML IJ SOLN
INTRAMUSCULAR | Status: AC
  Filled 2014-03-29: qty 2

## 2014-03-29 MED ORDER — LIDOCAINE HCL (CARDIAC) 20 MG/ML IV SOLN
INTRAVENOUS | Status: DC | PRN
  Administered 2014-03-29: 75 mg via INTRAVENOUS

## 2014-03-29 MED ORDER — LACTATED RINGERS IV SOLN
INTRAVENOUS | Status: DC
  Administered 2014-03-29: 1000 mL via INTRAVENOUS

## 2014-03-29 SURGICAL SUPPLY — 21 items

## 2014-03-29 NOTE — Anesthesia Preprocedure Evaluation (Addendum)
Anesthesia Evaluation  Patient identified by MRN, date of birth, ID band Patient awake    Reviewed: Allergy & Precautions, H&P , NPO status , Patient's Chart, lab work & pertinent test results  Airway Mallampati: II TM Distance: >3 FB Neck ROM: Full    Dental no notable dental hx.    Pulmonary asthma , sleep apnea , COPD COPD inhaler, former smoker,  Chronic obstructive asthma   PFT 11/06/10 - FEV1 1.24/ 0.62; FEV1/FVC 0.56, TLC 0.78; DLCO 0.75  breath sounds clear to auscultation  + decreased breath sounds      Cardiovascular hypertension, Pt. on medications Rhythm:Regular Rate:Normal     Neuro/Psych negative neurological ROS  negative psych ROS   GI/Hepatic Neg liver ROS, GERD-  ,  Endo/Other  negative endocrine ROS  Renal/GU negative Renal ROS  negative genitourinary   Musculoskeletal negative musculoskeletal ROS (+)   Abdominal   Peds negative pediatric ROS (+)  Hematology negative hematology ROS (+)   Anesthesia Other Findings   Reproductive/Obstetrics negative OB ROS                          Anesthesia Physical Anesthesia Plan  ASA: III  Anesthesia Plan: MAC   Post-op Pain Management:    Induction: Intravenous  Airway Management Planned: Nasal Cannula  Additional Equipment:   Intra-op Plan:   Post-operative Plan:   Informed Consent: I have reviewed the patients History and Physical, chart, labs and discussed the procedure including the risks, benefits and alternatives for the proposed anesthesia with the patient or authorized representative who has indicated his/her understanding and acceptance.   Dental advisory given  Plan Discussed with: CRNA and Surgeon  Anesthesia Plan Comments:         Anesthesia Quick Evaluation

## 2014-03-29 NOTE — Interval H&P Note (Signed)
History and Physical Interval Note:  03/29/2014 11:14 AM  [name] [name]  has presented today for surgery, with the diagnosis of screening colon  The various methods of treatment have been discussed with the patient and family. After consideration of risks, benefits and other options for treatment, the patient has consented to  Procedure(s) with comments: COLONOSCOPY WITH PROPOFOL (N/A) - COPD; supposed to be on home o2 at night but has weaned self off as a surgical intervention .  The patient's history has been reviewed, patient examined, no change in status, stable for surgery.  I have reviewed the patient's chart and labs.  Questions were answered to the patient's satisfaction.     Ladene Artist MD

## 2014-03-29 NOTE — Anesthesia Postprocedure Evaluation (Signed)
  Anesthesia Post-op Note  Patient: [name] [name]  Procedure(s) Performed: Procedure(s) (LRB): COLONOSCOPY WITH PROPOFOL (N/A)  Patient Location: PACU  Anesthesia Type: MAC  Level of Consciousness: awake and alert   Airway and Oxygen Therapy: Patient Spontanous Breathing  Post-op Pain: mild  Post-op Assessment: Post-op Vital signs reviewed, Patient's Cardiovascular Status Stable, Respiratory Function Stable, Patent Airway and No signs of Nausea or vomiting  Last Vitals:  Filed Vitals:   03/29/14 1222  BP: 160/67  Pulse: 55  Temp:   Resp: 13    Post-op Vital Signs: stable   Complications: No apparent anesthesia complications

## 2014-03-29 NOTE — Transfer of Care (Signed)
Immediate Anesthesia Transfer of Care Note  Patient: [name] [name]  Procedure(s) Performed: Procedure(s) with comments: COLONOSCOPY WITH PROPOFOL (N/A) - COPD; supposed to be on home o2 at night but has weaned self off  Patient Location: PACU  Anesthesia Type:MAC  Level of Consciousness: awake  Airway & Oxygen Therapy: Patient Spontanous Breathing and Patient connected to face mask oxygen  Post-op Assessment: Report given to PACU RN and Post -op Vital signs reviewed and stable  Post vital signs: Reviewed and stable  Complications: No apparent anesthesia complications

## 2014-03-29 NOTE — Op Note (Signed)
[facility] [place], [address]   COLONOSCOPY PROCEDURE REPORT  PATIENT: [name], [name]  MR#: [id number] BIRTHDATE: Sep 22, 1941 , 73  yrs. old GENDER: Female ENDOSCOPIST: Ladene Artist, MD, [facility] PROCEDURE DATE:  03/29/2014 PROCEDURE:   Colonoscopy with snare polypectomy First Screening Colonoscopy - Avg.  risk and is 50 yrs.  old or older - No.  Prior Negative Screening - Now for repeat screening. 10 or more years since last screening  History of Adenoma - Now for follow-up colonoscopy & has been > or = to 3 yrs.  N/A  Polyps Removed Today? Yes. ASA CLASS:   Class II INDICATIONS:average risk screening. MEDICATIONS: MAC sedation, administered by CRNA and propofol (Diprivan) 200mg  IV DESCRIPTION OF PROCEDURE:   After the risks benefits and alternatives of the procedure were thoroughly explained, informed consent was obtained.  A digital rectal exam revealed no abnormalities of the rectum.   The Pentax Ped Colon [id number] endoscope was introduced through the anus and advanced to the cecum, which was identified by both the appendix and ileocecal valve. No adverse events experienced.   The quality of the prep was excellent, using MoviPrep  The instrument was then slowly withdrawn as the colon was fully examined.  COLON FINDINGS: A sessile polyp measuring 6 mm in size was found in the transverse colon.  A polypectomy was performed with a cold snare.  The resection was complete and the polyp tissue was completely retrieved.   Mild diverticulosis was noted in the sigmoid colon.   The colon was otherwise normal.  There was no diverticulosis, inflammation, polyps or cancers unless previously stated.  Retroflexed views revealed no abnormalities. The time to cecum=3 minutes 00 seconds.  Withdrawal time=10 minutes 00 seconds. The scope was withdrawn and the procedure completed.  COMPLICATIONS: There were no complications.  ENDOSCOPIC IMPRESSION: 1.    Sessile polyp measuring 6 mm in the transverse colon; polypectomy performed with a cold snare 2.   Mild diverticulosis in the sigmoid colon  RECOMMENDATIONS: 1.  Await pathology results 2.  Repeat colonoscopy in 5 years if polyp is adenomatous; otherwise no plans for routine screening colonoscopies at these types of exams usually stop at age 39. 3.  High fiber diet with liberal fluid intake.  eSigned:  Ladene Artist, MD, [facility] 03/29/2014 12:02 PM      PATIENT NAME:  [name], [name] MR#: [id number]

## 2014-03-29 NOTE — H&P (View-Only) (Signed)
03/11/2014 ASMARA BACKS [id number] 03-14-1941   History of Present Illness:  This is a pleasant 73 year old female who is known to Dr. Fuller Plan for treatment of her GERD and for previous colonoscopy in the past. She presents to our office today because she is in need of her Nexium medication refills. She's been out of the medication now for couple of weeks. She says that her insurance company is now denying the medication although she has been on it for quite some time. She has been working with [employer] company to try to get it covered, however. She says that since she has been out of the medication she has had some discomfort in her upper abdomen and recurrent issues with her reflux as suspected. Also with a couple of episodes of nausea and vomiting as well, which is now resolved. She says that prior to running out of her medication her symptoms were well-controlled; she had no complaints or issues.  She would like to schedule her colonoscopy while she is here today as well. She was due for recall colonoscopy in April 2014, however, last year her son had a stroke and she was not able to get it done at that time. Her last colonoscopy was in April 2004 at which time she had a couple of polyps removed, but they were hyperplastic on pathology.  Just of note, her blood pressure was very high at her visit today. I rechecked it personally and it was 192/88. She states that she took her blood pressure medicine just prior to coming to her visit today and was rushing to get here. She says her blood pressure is normally not that high; she checks it at home. Also, she has a lot of issues with asthma and COPD. At one point she was on oxygen at night time, but she took herself off of that approximately one year ago.   Current Medications, Allergies, Past Medical History, Past Surgical History, Family History and Social History were reviewed in [employer] record.   Physical Exam: BP 204/80   Pulse 84  Ht 5\' 4"  (1.626 m)  Wt 164 lb 9.6 oz (74.662 kg)  BMI 28.24 kg/m2 General: Well developed black female in no acute distress Head: Normocephalic and atraumatic Eyes:  Sclerae anicteric, conjunctiva pink  Ears: Normal auditory acuity Lungs: Wheezing heard B/L. Heart: Regular rate and rhythm Abdomen: Soft, non-distended.  Normal bowel sounds.  Non-tender. Rectal:  Deferred.  Will be done at the time of colonoscopy. Musculoskeletal: Symmetrical with no gross deformities  Extremities: No edema  Neurological: Alert oriented x 4, grossly nonfocal Psychological:  Alert and cooperative. Normal mood and affect  Assessment and Recommendations: -Screening colonoscopy:  Last colonoscopy 02/2003 with polyps but were hyperplastic.  Will schedule at [facility] with Dr. Fuller Plan.  She was previously on O2 at home at night, but took herself off of it over a year ago (pulmonology is aware apparently).  The risks, benefits, and alternatives were discussed with the patient and she consents to proceed.  -GERD:  Will give her samples of Nexium 40 mg and will send over a new prescription as well, however, currently her insurance company is denying the medication now and she is working to get it covered.  She asked me to document the case number from her insurance company in case we need to refer to it.  That case number is [id number].

## 2014-03-30 ENCOUNTER — Encounter: Payer: Self-pay | Admitting: Gastroenterology

## 2014-03-30 ENCOUNTER — Encounter (HOSPITAL_COMMUNITY): Payer: Self-pay | Admitting: Gastroenterology

## 2014-03-31 ENCOUNTER — Encounter (HOSPITAL_COMMUNITY): Payer: Self-pay

## 2014-04-05 ENCOUNTER — Encounter (HOSPITAL_COMMUNITY)
Admission: RE | Admit: 2014-04-05 | Discharge: 2014-04-05 | Disposition: A | Discharge status: Home / Self Care | Payer: Self-pay | Source: Ambulatory Visit | Attending: Pulmonary Disease | Admitting: Pulmonary Disease

## 2014-04-06 ENCOUNTER — Ambulatory Visit (INDEPENDENT_AMBULATORY_CARE_PROVIDER_SITE_OTHER): Payer: Medicare Other

## 2014-04-06 DIAGNOSIS — J45909 Unspecified asthma, uncomplicated: Secondary | ICD-10-CM | POA: Diagnosis not present

## 2014-04-07 ENCOUNTER — Encounter (HOSPITAL_COMMUNITY)
Admission: RE | Admit: 2014-04-07 | Discharge: 2014-04-07 | Disposition: A | Discharge status: Home / Self Care | Payer: Self-pay | Source: Ambulatory Visit | Attending: Pulmonary Disease | Admitting: Pulmonary Disease

## 2014-04-07 MED ORDER — OMALIZUMAB 150 MG ~~LOC~~ SOLR
300.0000 mg | Freq: Once | SUBCUTANEOUS | Status: AC
Start: 2014-04-07 — End: 2014-04-07
  Administered 2014-04-07: 300 mg via SUBCUTANEOUS

## 2014-04-08 ENCOUNTER — Telehealth: Payer: Self-pay

## 2014-04-08 MED ORDER — FUROSEMIDE 40 MG PO TABS: 40.0000 mg | ORAL_TABLET | Freq: Every day | ORAL | Status: DC

## 2014-04-08 NOTE — Telephone Encounter (Signed)
Received a fax from [facility] [phone] for a refill on Furosemide 40 mg 1 tablet daily. I do not see this on current medication list. Patient has an upcoming appt with Dr Asa Lente to establish care. Please advise.

## 2014-04-08 NOTE — Telephone Encounter (Signed)
Fill until OV

## 2014-04-11 ENCOUNTER — Other Ambulatory Visit: Payer: Self-pay | Admitting: *Deleted

## 2014-04-11 MED ORDER — VITAMIN D (ERGOCALCIFEROL) 1.25 MG (50000 UNIT) PO CAPS: 50000.0000 [IU] | ORAL_CAPSULE | ORAL | Status: DC

## 2014-04-12 ENCOUNTER — Telehealth: Payer: Self-pay | Admitting: *Deleted

## 2014-04-12 ENCOUNTER — Encounter (HOSPITAL_COMMUNITY)
Admission: RE | Admit: 2014-04-12 | Discharge: 2014-04-12 | Disposition: A | Discharge status: Home / Self Care | Payer: Self-pay | Source: Ambulatory Visit | Attending: Pulmonary Disease | Admitting: Pulmonary Disease

## 2014-04-12 ENCOUNTER — Telehealth (HOSPITAL_COMMUNITY): Payer: Self-pay | Admitting: *Deleted

## 2014-04-12 DIAGNOSIS — J449 Chronic obstructive pulmonary disease, unspecified: Secondary | ICD-10-CM | POA: Insufficient documentation

## 2014-04-12 DIAGNOSIS — K219 Gastro-esophageal reflux disease without esophagitis: Secondary | ICD-10-CM | POA: Insufficient documentation

## 2014-04-12 DIAGNOSIS — G4730 Sleep apnea, unspecified: Secondary | ICD-10-CM | POA: Insufficient documentation

## 2014-04-12 DIAGNOSIS — Z5189 Encounter for other specified aftercare: Secondary | ICD-10-CM | POA: Insufficient documentation

## 2014-04-12 DIAGNOSIS — M810 Age-related osteoporosis without current pathological fracture: Secondary | ICD-10-CM | POA: Insufficient documentation

## 2014-04-12 DIAGNOSIS — J4489 Other specified chronic obstructive pulmonary disease: Secondary | ICD-10-CM | POA: Insufficient documentation

## 2014-04-12 MED ORDER — LOSARTAN POTASSIUM 50 MG PO TABS: 50.0000 mg | ORAL_TABLET | Freq: Every day | ORAL | Status: DC

## 2014-04-12 NOTE — Telephone Encounter (Signed)
Spoke with pt advised of MDs message 

## 2014-04-12 NOTE — Telephone Encounter (Signed)
joane from [facility]hab called states pts BP was 168/80 and 180/80 while riding stationary bike in [place].  Joane requests pt be called with further instructions.  Please advise

## 2014-04-12 NOTE — Telephone Encounter (Signed)
Start losartan 50mg  qd in addition to ongoing meds - erx done Will review at [facility] on 6/5 as scheduled with BP and labs recheck at that time Please come to visit fasting thanks

## 2014-04-13 ENCOUNTER — Encounter: Payer: Self-pay | Admitting: Internal Medicine

## 2014-04-14 ENCOUNTER — Encounter (HOSPITAL_COMMUNITY): Payer: Self-pay

## 2014-04-15 ENCOUNTER — Ambulatory Visit: Payer: Self-pay

## 2014-04-15 ENCOUNTER — Ambulatory Visit (INDEPENDENT_AMBULATORY_CARE_PROVIDER_SITE_OTHER): Payer: Medicare Other | Admitting: Internal Medicine

## 2014-04-15 ENCOUNTER — Encounter: Payer: Self-pay | Admitting: Internal Medicine

## 2014-04-15 ENCOUNTER — Other Ambulatory Visit (INDEPENDENT_AMBULATORY_CARE_PROVIDER_SITE_OTHER): Payer: Medicare Other

## 2014-04-15 VITALS — BP 162/82 | HR 64 | Temp 98.3°F | Wt 160.2 lb

## 2014-04-15 DIAGNOSIS — F411 Generalized anxiety disorder: Secondary | ICD-10-CM | POA: Diagnosis not present

## 2014-04-15 DIAGNOSIS — E785 Hyperlipidemia, unspecified: Secondary | ICD-10-CM

## 2014-04-15 DIAGNOSIS — I10 Essential (primary) hypertension: Secondary | ICD-10-CM | POA: Diagnosis not present

## 2014-04-15 DIAGNOSIS — F419 Anxiety disorder, unspecified: Secondary | ICD-10-CM

## 2014-04-15 DIAGNOSIS — J45909 Unspecified asthma, uncomplicated: Secondary | ICD-10-CM | POA: Diagnosis not present

## 2014-04-15 DIAGNOSIS — Z79899 Other long term (current) drug therapy: Secondary | ICD-10-CM | POA: Diagnosis not present

## 2014-04-15 DIAGNOSIS — J4550 Severe persistent asthma, uncomplicated: Secondary | ICD-10-CM

## 2014-04-15 LAB — CBC WITH DIFFERENTIAL/PLATELET
BASOS ABS: 0.1 10*3/uL (ref 0.0–0.1)
Basophils Relative: 1.1 % (ref 0.0–3.0)
EOS ABS: 1.1 10*3/uL — AB (ref 0.0–0.7)
Eosinophils Relative: 14.5 % — ABNORMAL HIGH (ref 0.0–5.0)
HCT: 39.1 % (ref 36.0–46.0)
Hemoglobin: 12.6 g/dL (ref 12.0–15.0)
Lymphocytes Relative: 26.5 % (ref 12.0–46.0)
Lymphs Abs: 2 10*3/uL (ref 0.7–4.0)
MCHC: 32.2 g/dL (ref 30.0–36.0)
MCV: 86.1 fl (ref 78.0–100.0)
Monocytes Absolute: 0.7 10*3/uL (ref 0.1–1.0)
Monocytes Relative: 9.3 % (ref 3.0–12.0)
NEUTROS PCT: 48.6 % (ref 43.0–77.0)
Neutro Abs: 3.7 10*3/uL (ref 1.4–7.7)
PLATELETS: 226 10*3/uL (ref 150.0–400.0)
RBC: 4.54 Mil/uL (ref 3.87–5.11)
RDW: 14.1 % (ref 11.5–15.5)
WBC: 7.6 10*3/uL (ref 4.0–10.5)

## 2014-04-15 LAB — BASIC METABOLIC PANEL
BUN: 18 mg/dL (ref 6–23)
CO2: 26 mEq/L (ref 19–32)
CREATININE: 1.1 mg/dL (ref 0.4–1.2)
Calcium: 9.7 mg/dL (ref 8.4–10.5)
Chloride: 108 mEq/L (ref 96–112)
GFR: 61.02 mL/min (ref >60.00)
Glucose, Bld: 98 mg/dL (ref 70–99)
Potassium: 4.2 mEq/L (ref 3.5–5.1)
Sodium: 140 mEq/L (ref 135–145)

## 2014-04-15 LAB — HEPATIC FUNCTION PANEL
ALT: 16 U/L (ref 0–35)
AST: 26 U/L (ref 0–37)
Albumin: 3.9 g/dL (ref 3.5–5.2)
Alkaline Phosphatase: 66 U/L (ref 39–117)
BILIRUBIN DIRECT: 0.1 mg/dL (ref 0.0–0.3)
BILIRUBIN TOTAL: 0.5 mg/dL (ref 0.2–1.2)
Total Protein: 7.7 g/dL (ref 6.0–8.3)

## 2014-04-15 LAB — LIPID PANEL
CHOLESTEROL: 204 mg/dL — AB (ref 0–200)
HDL: 55.9 mg/dL (ref >39.00)
LDL CALC: 135 mg/dL — AB (ref 0–99)
NonHDL: 148.1
TRIGLYCERIDES: 64 mg/dL (ref 0.0–149.0)
Total CHOL/HDL Ratio: 4
VLDL: 12.8 mg/dL (ref 0.0–40.0)

## 2014-04-15 LAB — TSH: TSH: 0.44 u[IU]/mL (ref 0.35–4.50)

## 2014-04-15 MED ORDER — HYDROCODONE-ACETAMINOPHEN 7.5-325 MG PO TABS: 1.0000 | ORAL_TABLET | Freq: Four times a day (QID) | ORAL | Status: DC | PRN

## 2014-04-15 MED ORDER — ALPRAZOLAM 0.25 MG PO TABS: 0.2500 mg | ORAL_TABLET | Freq: Two times a day (BID) | ORAL | Status: DC

## 2014-04-15 NOTE — Patient Instructions (Signed)
It was good to see you today.  We have reviewed your prior records including labs and tests today  Test(s) ordered today. Your results will be released to [facility] (or called to you) after review, usually within 72hours after test completion. If any changes need to be made, you will be notified at that same time.  Medications reviewed and updated Take low-dose Xanax twice daily for blood pressure and nerves, no changes recommended at this time.  Your prescription(s) have been submitted to your pharmacy. Please take as directed and contact our office if you believe you are having problem(s) with the medication(s).  we'll make referral to counseling. Our office will contact you regarding appointment(s) once made.  Please schedule followup in 2 weeks for blood pressure and mood recheck, call sooner if problems.

## 2014-04-15 NOTE — Assessment & Plan Note (Signed)
Chronic severe disease, steroid dependent Follows with pulmonary closely for same Ongoing pulmonary rehabilitation reviewed No medication changes recommended

## 2014-04-15 NOTE — Progress Notes (Signed)
Pre visit review using our clinic review tool, if applicable. No additional management support is needed unless otherwise documented below in the visit note. 

## 2014-04-15 NOTE — Assessment & Plan Note (Signed)
BP Readings from Last 3 Encounters:  04/15/14 162/82  03/29/14 160/67  03/29/14 160/67   History of same, labile and greatly affected by emotional state/anxiety per patient and daughter Patient reluctant to begin new medication therapy ARB as recommended earlier this week Patient reports usually well-controlled on current dose amlodipine and hctz Patient and family also believe blood pressure will be better controlled if anxiety controlled Therefore, initiate standing low-dose benzodiazepine in place of specific antihypertensive medication change at this time Patient to continue to monitor, will bring home blood pressure log to next visit Followup in 2 weeks, the patient/family will call sooner if problem

## 2014-04-15 NOTE — Assessment & Plan Note (Signed)
Chronic history of same Refer for counseling and start BID low dose xanax - may also help with HTN - see next

## 2014-04-15 NOTE — Progress Notes (Signed)
Subjective:    Patient ID: [name] [name], female    DOB: 1941/07/16, 73 y.o.   MRN: [id number]  HPI  New patient to me, here to establish with new primary care physician after retirement of [facility] Patient is here for follow up on hypertension  Also reviewed chronic medical issues and interval medical events  Past Medical History  Diagnosis Date  . Sleep apnea   . ALLERGIC RHINITIS   . Colon polyp, hyperplastic 02/2003, 03/2014  . Chronic rhinosinusitis   . Hyperlipidemia   . Osteoporosis   . GERD (gastroesophageal reflux disease)   . Hiatal hernia   . Helicobacter pylori gastritis 02/2009    partially treated  . Gastroparesis 2010  . Chronic obstructive asthma     PFT 11/06/10 - FEV1 1.24/ 0.62; FEV1/FVC 0.56, TLC 0.78; DLCO 0.75  . Allergic asthma     h/o  . Bronchiectasis     h/o  . Hypertension   . COPD (chronic obstructive pulmonary disease)   . Anxiety     Review of Systems  Constitutional: Positive for fatigue. Negative for fever and unexpected weight change.  Respiratory: Positive for shortness of breath (chronic). Negative for cough.   Cardiovascular: Negative for chest pain and leg swelling.  Neurological: Negative for dizziness, seizures, syncope, light-headedness, numbness and headaches.  Psychiatric/Behavioral: Positive for dysphoric mood and decreased concentration. The patient is nervous/anxious.        Objective:   Physical Exam  BP 162/82  Pulse 64  Temp(Src) 98.3 F (36.8 C) (Oral)  Wt 160 lb 3.2 oz (72.666 kg)  SpO2 98% Wt Readings from Last 3 Encounters:  04/15/14 160 lb 3.2 oz (72.666 kg)  03/11/14 164 lb 9.6 oz (74.662 kg)  02/02/14 164 lb 8 oz (74.617 kg)   Constitutional: She appears well-developed and well-nourished. No physical distress. Dtr at side Neck: Normal range of motion. Neck supple. No JVD present. No thyromegaly present.  Cardiovascular: Normal rate, regular rhythm and normal heart sounds.  No murmur heard. No BLE  edema. Pulmonary/Chest: Effort normal and breath sounds with occ wheeze but no increase WOB at rest.  Psychiatric: She has a anxious/depressed and somewhat labile mood and affect. Her behavior is normal. Judgment and thought content normal.   Lab Results  Component Value Date   WBC 8.8 11/09/2012   HGB 10.9* 11/09/2012   HCT 33.8* 11/09/2012   PLT 245.0 11/09/2012   GLUCOSE 130* 02/24/2013   CHOL 203* 02/24/2013   TRIG 92.0 02/24/2013   HDL 54.50 02/24/2013   LDLDIRECT 133.2 02/24/2013   LDLCALC 128* 12/25/2010   ALT 19 11/09/2012   AST 26 11/09/2012   NA 137 02/24/2013   K 4.1 02/24/2013   CL 105 02/24/2013   CREATININE 1.4* 02/24/2013   BUN 18 02/24/2013   CO2 25 02/24/2013   TSH 0.92 03/27/2009   HGBA1C 6.5 02/24/2013    No results found.     Assessment & Plan:   Problem List Items Addressed This Visit   Anxiety - Primary     Chronic history of same Refer for counseling and start BID low dose xanax - may also help with HTN - see next     Relevant Medications      ALPRAZolam  Duanne Moron) tablet   Other Relevant Orders      Basic metabolic panel      CBC with Differential      Hepatic function panel      Lipid panel  TSH      Urinalysis, Routine w reflex microscopic      Ambulatory referral to Psychology   Asthma, severe persistent     Chronic severe disease, steroid dependent Follows with pulmonary closely for same Ongoing pulmonary rehabilitation reviewed No medication changes recommended    Relevant Orders      Basic metabolic panel      CBC with Differential      Hepatic function panel      Lipid panel      TSH      Urinalysis, Routine w reflex microscopic   Hypertension      BP Readings from Last 3 Encounters:  04/15/14 162/82  03/29/14 160/67  03/29/14 160/67   History of same, labile and greatly affected by emotional state/anxiety per patient and daughter Patient reluctant to begin new medication therapy ARB as recommended earlier this week Patient  reports usually well-controlled on current dose amlodipine and hctz Patient and family also believe blood pressure will be better controlled if anxiety controlled Therefore, initiate standing low-dose benzodiazepine in place of specific antihypertensive medication change at this time Patient to continue to monitor, will bring home blood pressure log to next visit Followup in 2 weeks, the patient/family will call sooner if problem    Relevant Orders      Basic metabolic panel      CBC with Differential      Hepatic function panel      Lipid panel      TSH      Urinalysis, Routine w reflex microscopic   Other and unspecified hyperlipidemia     Not on medication therapy specifically for same Recheck lipids now and annually, encouraged diet and exercise control    Relevant Orders      Basic metabolic panel      CBC with Differential      Hepatic function panel      Lipid panel      TSH      Urinalysis, Routine w reflex microscopic

## 2014-04-15 NOTE — Assessment & Plan Note (Signed)
Not on medication therapy specifically for same Recheck lipids now and annually, encouraged diet and exercise control

## 2014-04-18 ENCOUNTER — Encounter (INDEPENDENT_AMBULATORY_CARE_PROVIDER_SITE_OTHER): Payer: Self-pay

## 2014-04-18 ENCOUNTER — Ambulatory Visit
Admission: RE | Admit: 2014-04-18 | Discharge: 2014-04-18 | Disposition: A | Discharge status: Home / Self Care | Payer: Medicare Other | Source: Ambulatory Visit

## 2014-04-18 ENCOUNTER — Telehealth: Payer: Self-pay | Admitting: *Deleted

## 2014-04-18 DIAGNOSIS — Z1231 Encounter for screening mammogram for malignant neoplasm of breast: Secondary | ICD-10-CM

## 2014-04-18 NOTE — Telephone Encounter (Signed)
Wanted to inform md pt wasn't able to give urine specimen so they are canceling urine order so other labs can be released,,,/lmb

## 2014-04-18 NOTE — Telephone Encounter (Signed)
Noted. Ok thanks

## 2014-04-19 ENCOUNTER — Encounter (HOSPITAL_COMMUNITY): Admission: RE | Admit: 2014-04-19 | Payer: Self-pay | Source: Ambulatory Visit

## 2014-04-21 ENCOUNTER — Encounter (HOSPITAL_COMMUNITY)
Admission: RE | Admit: 2014-04-21 | Discharge: 2014-04-21 | Disposition: A | Discharge status: Home / Self Care | Payer: Self-pay | Source: Ambulatory Visit | Attending: Pulmonary Disease | Admitting: Pulmonary Disease

## 2014-04-25 ENCOUNTER — Ambulatory Visit (INDEPENDENT_AMBULATORY_CARE_PROVIDER_SITE_OTHER): Payer: Medicare Other | Admitting: Internal Medicine

## 2014-04-25 VITALS — BP 134/68 | HR 71 | Ht 64.0 in | Wt 162.0 lb

## 2014-04-25 DIAGNOSIS — J45909 Unspecified asthma, uncomplicated: Secondary | ICD-10-CM | POA: Diagnosis not present

## 2014-04-25 DIAGNOSIS — J4550 Severe persistent asthma, uncomplicated: Secondary | ICD-10-CM

## 2014-04-25 NOTE — Progress Notes (Signed)
Subjective:    Patient ID: [name] [name], female    DOB: Apr 06, 1941, 73 y.o.   MRN: [id number]  HPI 1. ASTHMA - Baseline Chronic High Risk (multiple admits), Allergic, Moderate-Severe Persistent Asthma  - Allergies, gerd, sinus exacerbators.  - Frequent prednisone -> daily low dose prednisone since FEb 20112  - PFT 11/06/2010 - Mixed obstruction - restriction - fev1 1L/50%, Ratio 54, 24% BD response, TLC 3.6/78%, DLCO 16/75%  - May 2012: Switched asthma care to [name]  - June 2012 PFT - showed normalization with prednisone: PFts 04/18/2011 shows NORMALIZATION (fev1 1.8L/93%, ratio 68, 9% BD response, DLCO 86).  - August 2012: Fev1: 1.1lL/58% -> prednisone burst-> 1.09L/60%, Ratio 65 (no  - Nov 2012: Fev1 0.97L/51%, Ratio 60 -> pred burst  - Dec 2012: Fev1 0.7L/37%, Ratio 67%  - August 2014: FEV1  1.17 L/62%  2. Bronchiectasis, (mild RUL) seen on CT chest - April 2012 ( ABPA wu tests on prednisone in June 2012: asp antibody IgE asp fumigatus 0.24 ku/L which is borderline/equivocal, IgG for all all aspergiluus preciptins - NEGATIVE. 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 Allergic rhinitis and allergic asthma.  - 2000s: - per hx strongly skin test positive at Dr Neldon Mc office  - 2004 - trialed xolair - per hx effective but developed rash which she subjectively attributed to twice daily dosing (thogh chart review 2004-2005 states it was stopped due to subjective lack of improvement and an incidental rash resolution was noted in retrospect after stopping xolair)  - 2009: IgE 306 in July 2009. RAST positive for various grass, oak, elm, ragweed, plantain, lamb etc., s/p Allergy shot trial with Dr Annamaria Boots - stopped due to transport issues  - 2012: May - IgE 588.  - 2012: June - moved allergy MD to Dr Hardie Pulley  - 2013: Restart Xolair    4. Lung nodule, 6 mm, on CT scan. - April 2012. Possibly calcified. RUL (no prior for  comparison but a 07/17/2000 CT chest reports simillar size nodule in similar location). Repeat CT needed April 2013   5. Gastroesophageal reflux disease with hx of UGI scope showing Hiatal hernia (small)  - delayed gastric empyting 2010 March  6. History of sleep apnea.- non compliant with CPAP   7. Significant anxiety (also mood lability hx with prednisone, worsening anxiety with albuterol)    8. Recurrent AE-ASthma  - Admitted 4/23-4/30/12  - OPD AE asthma Rx: July 2012, august 2012, Nov 2012  - Office treatment with Levaquin and prednisone-November 2013 -  Levaquin and prednisone -January 2014   #Obesity  - 192# - nov 2013. STart low glycemic diet  - 178#  - 03/10/2013. Body mass index is 31.03 kg/(m^2). - 175# - 04/08/13 - 168# - 05/12/2013 with Body mass index is 28.93 kg/(m^2). - 167# on 07/07/2013 with Body mass index is 28.65 kg/(m^2). -  154# on 10/18/2013 wuth Body mass index is 26.42 kg/(m^2).  - Body mass index is 27.79 kg/(m^2).    OV 04/25/2014  Chief Complaint  Patient presents with  . Follow-up    Pt c/o SOB, difficulty breathing with the hot temperatures.      Fu severe persistent asthma on Symbicort, Xolair, Singulair, daily prednisone between 2 and 5 mg. Non-compliance suspected - 2nd opinion at [facility]  She says that overall her asthma stable. Today it is 76F with high humidity. She is to here for  a routine followup with me but with heat her asthma is acting up just making the trip. She does not want prednisone burst for the same. She said that she will go back home and stay cool. She insisted compliance  with the above asthma medications although she did mention that recently her antihypertensives were increased and she does not believe in taking too many medications and uses language like "of course I did not take that"  Review of Systems  Constitutional: Negative for fever and unexpected weight change.  HENT: Negative for congestion, dental problem, ear pain,  nosebleeds, postnasal drip, rhinorrhea, sinus pressure, sneezing, sore throat and trouble swallowing.   Eyes: Negative for redness and itching.  Respiratory: Positive for cough and shortness of breath. Negative for chest tightness and wheezing.   Cardiovascular: Negative for palpitations and leg swelling.  Gastrointestinal: Negative for nausea and vomiting.  Genitourinary: Negative for dysuria.  Musculoskeletal: Negative for joint swelling.  Skin: Negative for rash.  Neurological: Negative for headaches.  Hematological: Does not bruise/bleed easily.  Psychiatric/Behavioral: Negative for dysphoric mood. The patient is not nervous/anxious.        Objective:   Physical Exam  Filed Vitals:   04/25/14 1547  BP: 134/68  Pulse: 71  Height: 5\' 4"  (1.626 m)  Weight: 162 lb (73.483 kg)  SpO2: 96%  . Constitutional: She is oriented to person, place, and time. She appears well-developed and well-nourished. No distress.  HENT:  Head: Normocephalic and atraumatic.  Right Ear: External ear normal.  Left Ear: External ear normal.  Mouth/Throat: Oropharynx is clear and moist. No oropharyngeal exudate.  Eyes: Conjunctivae and EOM are normal. Pupils are equal, round, and reactive to light. Right eye exhibits no discharge. Left eye exhibits no discharge. No scleral icterus.  Neck: Normal range of motion. Neck supple. No JVD present. No tracheal deviation present. No thyromegaly present.  Cardiovascular: Normal rate, regular rhythm, normal heart sounds and intact distal pulses.  Exam reveals no gallop and no friction rub.   No murmur heard. Pulmonary/Chest: Effort normal. No respiratory distress. She has coarse wheezes present bialterally; due to heat today she says, refuses pred. She has no rales. She exhibits no tenderness.  Abdominal: Soft. Bowel sounds are normal. She exhibits no distension and no mass. There is no tenderness. There is no rebound and no guarding.  Musculoskeletal: Normal range of  motion. She exhibits no edema and no tenderness.  Lymphadenopathy:    She has no cervical adenopathy.  Neurological: She is alert and oriented to person, place, and time. She has normal reflexes. No cranial nerve deficit. She exhibits normal muscle tone. Coordination normal.  Skin: Skin is warm and dry. No rash noted. She is not diaphoretic. No erythema. No pallor.  Psychiatric: She has a normal mood and affect. Her behavior is normal. Judgment and thought content normal.         Assessment & Plan:  Asthma appears stable but today's 68F heat might be triggering a flare Continue current medications; symbicort, xolair, singulair, daily prednisone 2-5mg   Keep sinus under control through Dr Constance Holster and saline spray  -would recommend you consider sinus surgery Constance Holster recommends Keep reflux under control with nexium and low glycemic diet IF asthma flares up, call us for prednisone burst  Followup IF worse, call us Followup 3 months or sooner Spirometry in office at followup (not PFT by June Leap)

## 2014-04-25 NOTE — Patient Instructions (Addendum)
Asthma appears stable but today's 64F heat might be triggering a flare Continue current medications; symbicort, xolair, singulair, daily prednisone 2-5mg   Keep sinus under control through Dr Constance Holster and saline spray  -would recommend you consider sinus surgery Constance Holster recommends Keep reflux under control with nexium and low glycemic diet IF asthma flares up, call us for prednisone burst  Followup IF worse, call us Followup 3 months or sooner Spirometry in office at followup (not PFT by June Leap)

## 2014-04-26 ENCOUNTER — Encounter (HOSPITAL_COMMUNITY): Payer: Self-pay

## 2014-04-26 ENCOUNTER — Encounter: Payer: Self-pay | Admitting: Internal Medicine

## 2014-04-28 ENCOUNTER — Encounter (HOSPITAL_COMMUNITY)
Admission: RE | Admit: 2014-04-28 | Discharge: 2014-04-28 | Disposition: A | Discharge status: Home / Self Care | Payer: Self-pay | Source: Ambulatory Visit | Attending: Pulmonary Disease | Admitting: Pulmonary Disease

## 2014-04-29 ENCOUNTER — Ambulatory Visit (INDEPENDENT_AMBULATORY_CARE_PROVIDER_SITE_OTHER): Payer: Medicare Other

## 2014-04-29 ENCOUNTER — Ambulatory Visit: Payer: Medicare Other | Admitting: Internal Medicine

## 2014-04-29 VITALS — BP 154/76 | HR 60 | Resp 16 | Ht 64.0 in | Wt 161.0 lb

## 2014-04-29 DIAGNOSIS — M779 Enthesopathy, unspecified: Secondary | ICD-10-CM

## 2014-04-29 DIAGNOSIS — M205X9 Other deformities of toe(s) (acquired), unspecified foot: Secondary | ICD-10-CM | POA: Diagnosis not present

## 2014-04-29 DIAGNOSIS — M7750 Other enthesopathy of unspecified foot: Secondary | ICD-10-CM | POA: Diagnosis not present

## 2014-04-29 DIAGNOSIS — M205X1 Other deformities of toe(s) (acquired), right foot: Secondary | ICD-10-CM

## 2014-04-29 DIAGNOSIS — M79609 Pain in unspecified limb: Secondary | ICD-10-CM

## 2014-04-29 DIAGNOSIS — M2010 Hallux valgus (acquired), unspecified foot: Secondary | ICD-10-CM | POA: Diagnosis not present

## 2014-04-29 DIAGNOSIS — M79604 Pain in right leg: Secondary | ICD-10-CM

## 2014-04-29 DIAGNOSIS — M778 Other enthesopathies, not elsewhere classified: Secondary | ICD-10-CM

## 2014-04-29 NOTE — Progress Notes (Signed)
   Subjective:    Patient ID: Barbee Shropshire, female    DOB: Jun 10, 1941, 73 y.o.   MRN: [id number]  HPI Comments: "I have pain in this foot"  Patient c/o aching 1st MPJ right foot for a 1 month. There is a knot dorsally. She states shoes are uncomfortable. She tries to wear open shoes.   Foot Pain Associated symptoms include arthralgias.      Review of Systems  HENT: Positive for sinus pressure and sneezing.   Respiratory: Positive for apnea, shortness of breath and wheezing.   Genitourinary: Positive for frequency.  Musculoskeletal: Positive for arthralgias.  Allergic/Immunologic: Positive for environmental allergies and food allergies.  All other systems reviewed and are negative.      Objective:   Physical Exam 73 year old [place] female process at this time with a new problem for followup was last seen several years ago. Well-developed well-nourished oriented x3. Extremity objective findings as follows vascular status appears to be intact with pedal pulses palpable DP +2/4 bilateral PT plus one over 4 bilateral capillary refill time 3 seconds all digits epicritic and proprioceptive sensations appear to be intact and symmetric bilateral normal plantar response DTRs not elicited . Neurologically epicritic sensations again intact orthopedic biomechanical exam reveals prominence of the first MTP area bilateral right more so than left lateral deviation of hallux on both feet as well the right is painful tender symptomatic both on palpation range of motion the first MTP area is also some callusing on the adjacent surface of the hallux and second digits due to the lateral deviation of the hallux and bunion deformity. 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 Plan at this time dispensed and tubercle padding cushion area patient currently taking some prednisone for arthropathy as well is taking Vicodin on an as-needed basis for pain offered an NSAID hour that would likely be a terminal to her GI health. At this time will avoid NSAID if needed plain Tylenol is recommended for any breakthrough pain also at this time patient is advised wider more accommodative shoes literature about bunions surgery is dispensed for her to consider patient is a strong candidate for likely [facility] bunionectomy with K wire for screw fixation under IV sedation and regional block patient also does have respiratory history and I suggested followup with her respiratory Dr. get clearance prior to considering any kind of surgery were scheduling any surgery. Patient will follow ready for surgical intervention discussion.  Harriet Masson DPM

## 2014-04-29 NOTE — Patient Instructions (Signed)
Bunionectomy A bunionectomy is surgery to remove a bunion. A bunion is an enlargement of the joint at the base of the big toe. It is made up of bone and soft tissue on the inside part of the joint. Over time, a painful lump appears on the inside of the joint. The big toe begins to point inward toward the second toe. New bone growth can occur and a bone spur may form. The pain eventually causes difficulty walking. A bunion usually results from inflammation caused by the irritation of poorly fitting shoes. It often begins later in life. A bunionectomy is performed when nonsurgical treatment no longer works. When surgery is needed, the extent of the procedure will depend on the degree of deformity of the foot. Your surgeon will discuss with you the different procedures and what will work best for you depending on your age and health. LET YOUR CAREGIVER KNOW ABOUT:   Previous problems with anesthetics or medicines used to numb the skin.  Allergies to dyes, iodine, foods, and/or latex.  Medicines taken including herbs, eye drops, prescription medicines (especially medicines used to "thin the blood"), aspirin and other over-the-counter medicines, and steroids (by mouth or as a cream).  History of bleeding or blood problems.  Possibility of pregnancy, if this applies.  History of blood clots in your legs and/or lungs .  Previous surgery.  Other important health problems. RISKS AND COMPLICATIONS   Infection.  Pain.  Nerve damage.  Possibility that the bunion will recur. BEFORE THE PROCEDURE  You should be present 60 minutes prior to your procedure or as directed.  PROCEDURE  Surgery is often done so that you can go home the same day (outpatient). It may be done in a hospital or in an outpatient surgical center. An anesthetic will be used to help you sleep during the procedure. Sometimes, a spinal anesthetic is used to make you numb below the waist. A cut (incision) is made over the swollen  area at the first joint of the big toe. The enlarged lump will be removed. If there is a need to reposition the bones of the big toe, this may require more than 1 incision. The bone itself may need to be cut. Screws and wires may be used in the repair. These can be removed at a later date. In severe cases, the entire joint may need to be removed and a joint replacement inserted. When done, the incision is closed with stitches (sutures). Skin adhesive strips may be added for reinforcement. They help hold the incision closed.  AFTER THE PROCEDURE  Compression bandages (dressings) are then wrapped around the wound. This helps to keep the foot in alignment and reduce swelling. Your foot will be monitored for bleeding and swelling. You will need to stay for a few hours in the recovery area before being discharged. This allows time for the anesthesia to wear off. You will be discharged home when you are awake, stable, and doing well. HOME CARE INSTRUCTIONS   You can expect to return to normal activities within 6 to 8 weeks after surgery. The foot is at increased risk for swelling for several months. When you can expect to bear weight on the operated foot will depend on the extent of your surgery. The milder the deformity, the less tissue is removed and the sooner the return to normal activity level. During the recovery period, a special shoe, boot, or cast may be worn to accommodate the surgical bandage and to help provide stability   to the foot.  Once you are home, an ice pack applied to the operative site may help with discomfort and keep swelling down. Stop using the ice if it causes discomfort.  Keep your feet raised (elevated) when possible to lessen swelling.  If you have an elastic bandage on your foot and you have numbness, tingling, or your foot becomes cold and blue, adjust the bandage to make it comfortable.  Change dressings as directed.  Keep the wound dry and clean. The wound may be washed  gently with soap and water. Gently blot dry without rubbing. Do not take baths or use swimming pools or hot tubs for 10 days, or as instructed by your caregiver.  Only take over-the-counter or prescription medicines for pain, discomfort, or fever as directed by your caregiver.  You may continue a normal diet as directed.  For activity, use crutches with no weight bearing or your orthopedic shoe as directed. Continue to use crutches or a cane as directed until you can stand without causing pain. SEEK MEDICAL CARE IF:   You have redness, swelling, bruising, or increasing pain in the wound.  There is pus coming from the wound.  You have drainage from a wound lasting longer than 1 day.  You have an oral temperature above 102 F (38.9 C).  You notice a bad smell coming from the wound or dressing.  The wound breaks open after sutures have been removed.  You develop dizzy episodes or fainting while standing.  You have persistent nausea or vomiting.  Your toes become cold.  Pain is not relieved with medicines. SEEK IMMEDIATE MEDICAL CARE IF:   You develop a rash.  You have difficulty breathing.  You develop any reaction or side effects to medicines given.  Your toes are numb or blue, or you have severe pain. MAKE SURE YOU:   Understand these instructions.  Will watch your condition.  Will get help right away if you are not doing well or get worse. Document Released: 10/11/2005 Document Revised: 01/20/2012 Document Reviewed: 11/16/2007 Exit[employer] Patient Information 2015 [employer]. This information is not intended to replace advice given to you by your health care provider. Make sure you discuss any questions you have with your health care provider.  

## 2014-05-01 ENCOUNTER — Encounter: Payer: Self-pay | Admitting: Internal Medicine

## 2014-05-01 NOTE — Assessment & Plan Note (Signed)
Asthma appears stable but today's 34F heat might be triggering a flare Continue current medications; symbicort, xolair, singulair, daily prednisone 2-5mg   Keep sinus under control through Dr Constance Holster and saline spray  -would recommend you consider sinus surgery Constance Holster recommends Keep reflux under control with nexium and low glycemic diet IF asthma flares up, call us for prednisone burst  Followup IF worse, call us Followup 3 months or sooner Spirometry in office at followup (not PFT by June Leap)

## 2014-05-03 ENCOUNTER — Ambulatory Visit: Payer: Medicare Other | Admitting: Internal Medicine

## 2014-05-03 ENCOUNTER — Encounter (HOSPITAL_COMMUNITY)
Admission: RE | Admit: 2014-05-03 | Discharge: 2014-05-03 | Disposition: A | Discharge status: Home / Self Care | Payer: Self-pay | Source: Ambulatory Visit | Attending: Pulmonary Disease | Admitting: Pulmonary Disease

## 2014-05-04 ENCOUNTER — Ambulatory Visit: Payer: Medicare Other

## 2014-05-05 ENCOUNTER — Encounter (HOSPITAL_COMMUNITY): Payer: Self-pay

## 2014-05-09 ENCOUNTER — Ambulatory Visit (INDEPENDENT_AMBULATORY_CARE_PROVIDER_SITE_OTHER): Payer: Medicare Other

## 2014-05-09 ENCOUNTER — Encounter: Payer: Self-pay | Admitting: Internal Medicine

## 2014-05-09 ENCOUNTER — Ambulatory Visit (INDEPENDENT_AMBULATORY_CARE_PROVIDER_SITE_OTHER): Payer: Medicare Other | Admitting: Internal Medicine

## 2014-05-09 VITALS — BP 130/80 | HR 64 | Temp 98.3°F | Ht 64.0 in | Wt 160.4 lb

## 2014-05-09 DIAGNOSIS — F411 Generalized anxiety disorder: Secondary | ICD-10-CM

## 2014-05-09 DIAGNOSIS — I10 Essential (primary) hypertension: Secondary | ICD-10-CM | POA: Diagnosis not present

## 2014-05-09 DIAGNOSIS — J45909 Unspecified asthma, uncomplicated: Secondary | ICD-10-CM

## 2014-05-09 DIAGNOSIS — F419 Anxiety disorder, unspecified: Secondary | ICD-10-CM

## 2014-05-09 DIAGNOSIS — J4550 Severe persistent asthma, uncomplicated: Secondary | ICD-10-CM

## 2014-05-09 MED ORDER — DESLORATADINE 5 MG PO TABS: 5.0000 mg | ORAL_TABLET | Freq: Every day | ORAL | Status: DC

## 2014-05-09 MED ORDER — AMLODIPINE BESYLATE 5 MG PO TABS: 5.0000 mg | ORAL_TABLET | Freq: Every morning | ORAL | Status: DC

## 2014-05-09 MED ORDER — ESOMEPRAZOLE MAGNESIUM 40 MG PO CPDR: 40.0000 mg | DELAYED_RELEASE_CAPSULE | Freq: Every day | ORAL | Status: DC

## 2014-05-09 MED ORDER — HYDROCHLOROTHIAZIDE 25 MG PO TABS: 25.0000 mg | ORAL_TABLET | Freq: Every morning | ORAL | Status: DC

## 2014-05-09 MED ORDER — MONTELUKAST SODIUM 10 MG PO TABS
10.0000 mg | ORAL_TABLET | Freq: Every day | ORAL | Status: DC
Start: 2014-05-09 — End: 2015-06-21

## 2014-05-09 MED ORDER — BUDESONIDE-FORMOTEROL FUMARATE 160-4.5 MCG/ACT IN AERO: 2.0000 | INHALATION_SPRAY | Freq: Two times a day (BID) | RESPIRATORY_TRACT | Status: DC

## 2014-05-09 NOTE — Progress Notes (Signed)
Pre visit review using our clinic review tool, if applicable. No additional management support is needed unless otherwise documented below in the visit note. 

## 2014-05-09 NOTE — Progress Notes (Signed)
Subjective:    Patient ID: [name] [name], female    DOB: [birth year], 73 y.o.   MRN: [id number]  HPI  Patient is here for follow up  Reviewed chronic medical issues and interval medical events  Past Medical History  Diagnosis Date  . Sleep apnea   . ALLERGIC RHINITIS   . Colon polyp, hyperplastic 02/2003, 03/2014  . Chronic rhinosinusitis   . Hyperlipidemia   . Osteoporosis   . GERD (gastroesophageal reflux disease)   . Hiatal hernia   . Helicobacter pylori gastritis 02/2009    partially treated  . Gastroparesis 2010  . Chronic obstructive asthma     PFT 11/06/10 - FEV1 1.24/ 0.62; FEV1/FVC 0.56, TLC 0.78; DLCO 0.75  . Allergic asthma     h/o  . Bronchiectasis     h/o  . Hypertension   . COPD (chronic obstructive pulmonary disease)   . Anxiety     Review of Systems  Constitutional: Negative for fever and unexpected weight change.  Respiratory: Positive for shortness of breath (episodic, chronic). Negative for cough and wheezing.   Cardiovascular: Negative for chest pain and leg swelling.       Objective:   Physical Exam  BP 130/80  Pulse 64  Temp(Src) 98.3 F (36.8 C) (Oral)  Ht 5\' 4"  (1.626 m)  Wt 160 lb 6.4 oz (72.757 kg)  BMI 27.52 kg/m2  SpO2 95% Wt Readings from Last 3 Encounters:  05/09/14 160 lb 6.4 oz (72.757 kg)  04/29/14 161 lb (73.029 kg)  04/25/14 162 lb (73.483 kg)   Constitutional: She appears well-developed and well-nourished. No distress.  Neck: Normal range of motion. Neck supple. No JVD present. No thyromegaly present.  Cardiovascular: Normal rate, regular rhythm and normal heart sounds.  No murmur heard. No BLE edema. Pulmonary/Chest: Effort normal at rest and breath sounds with soft exp rhonchi/wheeze R>L base. No respiratory distress.   Psychiatric: She has a normal mood and affect. Her behavior is normal. Judgment and thought content normal.   Lab Results  Component Value Date   WBC 7.6 04/15/2014   HGB 12.6 04/15/2014   HCT 39.1  04/15/2014   PLT 226.0 04/15/2014   GLUCOSE 98 04/15/2014   CHOL 204* 04/15/2014   TRIG 64.0 04/15/2014   HDL 55.90 04/15/2014   LDLDIRECT 133.2 02/24/2013   LDLCALC 135* 04/15/2014   ALT 16 04/15/2014   AST 26 04/15/2014   NA 140 04/15/2014   K 4.2 04/15/2014   CL 108 04/15/2014   CREATININE 1.1 04/15/2014   BUN 18 04/15/2014   CO2 26 04/15/2014   TSH 0.44 04/15/2014   HGBA1C 6.5 02/24/2013    No results found.     Assessment & Plan:   Problem List Items Addressed This Visit   Anxiety     Chronic history of same Refer for counseling started BID prn low dose xanax - some improved Also intended to help with HTN - see above    Relevant Orders      Ambulatory referral to Psychology   Asthma, severe persistent     Chronic severe disease, steroid dependent Follows with pulmonary closely for same Ongoing pulmonary rehabilitation reviewed No medication changes recommended    Hypertension - Primary      BP Readings from Last 3 Encounters:  05/09/14 130/80  04/29/14 154/76  04/25/14 134/68   History of same, labile and greatly affected by emotional state/anxiety per patient and daughter Patient reports usually well-controlled on current dose amlodipine and  hctz Patient and family believe blood pressure are best controlled when anxiety controlled Patient to continue to monitor, will bring home blood pressure log to each OV The current medical regimen is effective;  continue present plan and medications.       Time spent with pt today 25 minutes, greater than 50% time spent counseling patient on anxiety, asthma, hypertension and medication review. Also review of prior records

## 2014-05-09 NOTE — Assessment & Plan Note (Signed)
Chronic history of same Refer for counseling started BID prn low dose xanax - some improved Also intended to help with HTN - see above

## 2014-05-09 NOTE — Patient Instructions (Addendum)
It was good to see you today.  We have reviewed your prior records including labs and tests today  Medications reviewed and updated, no changes recommended at this time. Refill on medication(s) as discussed today.  we'll make referral to counseling. Our office will contact you regarding appointment(s) once made.  Please schedule followup in 6-12 months, call sooner if problems.  Hypertension Hypertension, commonly called high blood pressure, is when the force of blood pumping through your arteries is too strong. Your arteries are the blood vessels that carry blood from your heart throughout your body. A blood pressure reading consists of a higher number over a lower number, such as 110/72. The higher number (systolic) is the pressure inside your arteries when your heart pumps. The lower number (diastolic) is the pressure inside your arteries when your heart relaxes. Ideally you want your blood pressure below 120/80. Hypertension forces your heart to work harder to pump blood. Your arteries may become narrow or stiff. Having hypertension puts you at risk for heart disease, stroke, and other problems.  RISK FACTORS Some risk factors for high blood pressure are controllable. Others are not.  Risk factors you cannot control include:   Race. You may be at higher risk if you are African American.  Age. Risk increases with age.  Gender. Men are at higher risk than women before age 78 years. After age 15, women are at higher risk than men. Risk factors you can control include:  Not getting enough exercise or physical activity.  Being overweight.  Getting too much fat, sugar, calories, or salt in your diet.  Drinking too much alcohol. SIGNS AND SYMPTOMS Hypertension does not usually cause signs or symptoms. Extremely high blood pressure (hypertensive crisis) may cause headache, anxiety, shortness of breath, and nosebleed. DIAGNOSIS  To check if you have hypertension, your health care  provider will measure your blood pressure while you are seated, with your arm held at the level of your heart. It should be measured at least twice using the same arm. Certain conditions can cause a difference in blood pressure between your right and left arms. A blood pressure reading that is higher than normal on one occasion does not mean that you need treatment. If one blood pressure reading is high, ask your health care provider about having it checked again. TREATMENT  Treating high blood pressure includes making lifestyle changes and possibly taking medication. Living a healthy lifestyle can help lower high blood pressure. You may need to change some of your habits. Lifestyle changes may include:  Following the DASH diet. This diet is high in fruits, vegetables, and whole grains. It is low in salt, red meat, and added sugars.  Getting at least 2 1/2 hours of brisk physical activity every week.  Losing weight if necessary.  Not smoking.  Limiting alcoholic beverages.  Learning ways to reduce stress. If lifestyle changes are not enough to get your blood pressure under control, your health care provider may prescribe medicine. You may need to take more than one. Work closely with your health care provider to understand the risks and benefits. HOME CARE INSTRUCTIONS  Have your blood pressure rechecked as directed by your health care provider.   Only take medicine as directed by your health care provider. Follow the directions carefully. Blood pressure medicines must be taken as prescribed. The medicine does not work as well when you skip doses. Skipping doses also puts you at risk for problems.   Do not smoke.  Monitor your blood pressure at home as directed by your health care provider. SEEK MEDICAL CARE IF:   You think you are having a reaction to medicines taken.  You have recurrent headaches or feel dizzy.  You have swelling in your ankles.  You have trouble with your  vision. SEEK IMMEDIATE MEDICAL CARE IF:  You develop a severe headache or confusion.  You have unusual weakness, numbness, or feel faint.  You have severe chest or abdominal pain.  You vomit repeatedly.  You have trouble breathing. MAKE SURE YOU:   Understand these instructions.  Will watch your condition.  Will get help right away if you are not doing well or get worse. Document Released: 10/28/2005 Document Revised: 11/02/2013 Document Reviewed: 08/20/2013 [facility] Patient Information 2015 [place], [place]. This information is not intended to replace advice given to you by your health care provider. Make sure you discuss any questions you have with your health care provider.

## 2014-05-09 NOTE — Assessment & Plan Note (Signed)
Chronic severe disease, steroid dependent Follows with pulmonary closely for same Ongoing pulmonary rehabilitation reviewed No medication changes recommended

## 2014-05-09 NOTE — Assessment & Plan Note (Signed)
BP Readings from Last 3 Encounters:  05/09/14 130/80  04/29/14 154/76  04/25/14 134/68   History of same, labile and greatly affected by emotional state/anxiety per patient and daughter Patient reports usually well-controlled on current dose amlodipine and hctz Patient and family believe blood pressure are best controlled when anxiety controlled Patient to continue to monitor, will bring home blood pressure log to each OV The current medical regimen is effective;  continue present plan and medications.

## 2014-05-10 ENCOUNTER — Telehealth: Payer: Self-pay | Admitting: Internal Medicine

## 2014-05-10 ENCOUNTER — Encounter (HOSPITAL_COMMUNITY)
Admission: RE | Admit: 2014-05-10 | Discharge: 2014-05-10 | Disposition: A | Discharge status: Home / Self Care | Payer: Self-pay | Source: Ambulatory Visit | Attending: Pulmonary Disease | Admitting: Pulmonary Disease

## 2014-05-10 MED ORDER — OMALIZUMAB 150 MG ~~LOC~~ SOLR
300.0000 mg | Freq: Once | SUBCUTANEOUS | Status: AC
  Administered 2014-05-10: 300 mg via SUBCUTANEOUS

## 2014-05-10 NOTE — Telephone Encounter (Signed)
Relevant patient education mailed to patient.  

## 2014-05-12 ENCOUNTER — Encounter (HOSPITAL_COMMUNITY): Payer: Self-pay

## 2014-05-12 DIAGNOSIS — G4730 Sleep apnea, unspecified: Secondary | ICD-10-CM | POA: Insufficient documentation

## 2014-05-12 DIAGNOSIS — Z5189 Encounter for other specified aftercare: Secondary | ICD-10-CM | POA: Insufficient documentation

## 2014-05-12 DIAGNOSIS — J449 Chronic obstructive pulmonary disease, unspecified: Secondary | ICD-10-CM | POA: Insufficient documentation

## 2014-05-12 DIAGNOSIS — J4489 Other specified chronic obstructive pulmonary disease: Secondary | ICD-10-CM | POA: Insufficient documentation

## 2014-05-12 DIAGNOSIS — M810 Age-related osteoporosis without current pathological fracture: Secondary | ICD-10-CM | POA: Insufficient documentation

## 2014-05-12 DIAGNOSIS — K219 Gastro-esophageal reflux disease without esophagitis: Secondary | ICD-10-CM | POA: Insufficient documentation

## 2014-05-17 ENCOUNTER — Telehealth: Payer: Self-pay

## 2014-05-17 ENCOUNTER — Encounter (HOSPITAL_COMMUNITY)
Admission: RE | Admit: 2014-05-17 | Discharge: 2014-05-17 | Disposition: A | Discharge status: Home / Self Care | Payer: Self-pay | Source: Ambulatory Visit | Attending: Pulmonary Disease | Admitting: Pulmonary Disease

## 2014-05-17 NOTE — Telephone Encounter (Signed)
Medical supplies ordered for DX Incontinence Urinary other [phone]

## 2014-05-19 ENCOUNTER — Encounter (HOSPITAL_COMMUNITY)
Admission: RE | Admit: 2014-05-19 | Discharge: 2014-05-19 | Disposition: A | Discharge status: Home / Self Care | Payer: Self-pay | Source: Ambulatory Visit | Attending: Pulmonary Disease | Admitting: Pulmonary Disease

## 2014-05-24 ENCOUNTER — Encounter (HOSPITAL_COMMUNITY)
Admission: RE | Admit: 2014-05-24 | Discharge: 2014-05-24 | Disposition: A | Discharge status: Home / Self Care | Payer: Self-pay | Source: Ambulatory Visit | Attending: Pulmonary Disease | Admitting: Pulmonary Disease

## 2014-05-26 ENCOUNTER — Encounter (HOSPITAL_COMMUNITY)
Admission: RE | Admit: 2014-05-26 | Discharge: 2014-05-26 | Disposition: A | Discharge status: Home / Self Care | Payer: Self-pay | Source: Ambulatory Visit | Attending: Pulmonary Disease | Admitting: Pulmonary Disease

## 2014-05-31 ENCOUNTER — Telehealth: Payer: Self-pay | Admitting: Internal Medicine

## 2014-05-31 ENCOUNTER — Encounter (HOSPITAL_COMMUNITY)
Admission: RE | Admit: 2014-05-31 | Discharge: 2014-05-31 | Disposition: A | Discharge status: Home / Self Care | Payer: Self-pay | Source: Ambulatory Visit | Attending: Pulmonary Disease | Admitting: Pulmonary Disease

## 2014-05-31 ENCOUNTER — Other Ambulatory Visit: Payer: Self-pay

## 2014-05-31 MED ORDER — HYDROCODONE-ACETAMINOPHEN 7.5-325 MG PO TABS: 1.0000 | ORAL_TABLET | Freq: Four times a day (QID) | ORAL | Status: DC | PRN

## 2014-05-31 NOTE — Telephone Encounter (Signed)
Patient called requesting new rx for hydrocodone. She is aware she needs to provide urine specimen. Call mobile # when ready for pick up.

## 2014-06-01 ENCOUNTER — Other Ambulatory Visit: Payer: Self-pay | Admitting: Internal Medicine

## 2014-06-01 NOTE — Telephone Encounter (Signed)
Pt informed rx is ready for pick up

## 2014-06-02 ENCOUNTER — Encounter (HOSPITAL_COMMUNITY)
Admission: RE | Admit: 2014-06-02 | Discharge: 2014-06-02 | Disposition: A | Discharge status: Home / Self Care | Payer: Self-pay | Source: Ambulatory Visit | Attending: Pulmonary Disease | Admitting: Pulmonary Disease

## 2014-06-06 ENCOUNTER — Ambulatory Visit (INDEPENDENT_AMBULATORY_CARE_PROVIDER_SITE_OTHER): Payer: Medicare Other

## 2014-06-06 DIAGNOSIS — J4550 Severe persistent asthma, uncomplicated: Secondary | ICD-10-CM

## 2014-06-06 DIAGNOSIS — J45909 Unspecified asthma, uncomplicated: Secondary | ICD-10-CM

## 2014-06-07 ENCOUNTER — Encounter (HOSPITAL_COMMUNITY)
Admission: RE | Admit: 2014-06-07 | Discharge: 2014-06-07 | Disposition: A | Discharge status: Home / Self Care | Payer: Self-pay | Source: Ambulatory Visit | Attending: Pulmonary Disease | Admitting: Pulmonary Disease

## 2014-06-07 MED ORDER — OMALIZUMAB 150 MG ~~LOC~~ SOLR
300.0000 mg | Freq: Once | SUBCUTANEOUS | Status: AC
Start: 2014-06-07 — End: 2014-06-07
  Administered 2014-06-07: 300 mg via SUBCUTANEOUS

## 2014-06-09 ENCOUNTER — Telehealth: Payer: Self-pay | Admitting: *Deleted

## 2014-06-09 ENCOUNTER — Encounter (HOSPITAL_COMMUNITY)
Admission: RE | Admit: 2014-06-09 | Discharge: 2014-06-09 | Disposition: A | Discharge status: Home / Self Care | Payer: Self-pay | Source: Ambulatory Visit | Attending: Pulmonary Disease | Admitting: Pulmonary Disease

## 2014-06-09 NOTE — Telephone Encounter (Signed)
Ok to send rx in my name thanks

## 2014-06-09 NOTE — Telephone Encounter (Addendum)
Fax from [facility] pharmacy requesting Restasis 0.05% Eye Emulsion.  Last refill 5.22.15, last OV 7.7.15.  This medication has not been prescribed by you before.  Please advise refill.

## 2014-06-10 ENCOUNTER — Other Ambulatory Visit: Payer: Self-pay | Admitting: *Deleted

## 2014-06-10 MED ORDER — CYCLOSPORINE 0.05 % OP EMUL: 1.0000 [drp] | Freq: Two times a day (BID) | OPHTHALMIC | Status: DC

## 2014-06-14 ENCOUNTER — Encounter (HOSPITAL_COMMUNITY): Payer: Medicare Other

## 2014-06-14 DIAGNOSIS — Z5189 Encounter for other specified aftercare: Secondary | ICD-10-CM | POA: Insufficient documentation

## 2014-06-14 DIAGNOSIS — K219 Gastro-esophageal reflux disease without esophagitis: Secondary | ICD-10-CM | POA: Insufficient documentation

## 2014-06-14 DIAGNOSIS — J4489 Other specified chronic obstructive pulmonary disease: Secondary | ICD-10-CM | POA: Insufficient documentation

## 2014-06-14 DIAGNOSIS — G4730 Sleep apnea, unspecified: Secondary | ICD-10-CM | POA: Insufficient documentation

## 2014-06-14 DIAGNOSIS — J449 Chronic obstructive pulmonary disease, unspecified: Secondary | ICD-10-CM | POA: Insufficient documentation

## 2014-06-14 DIAGNOSIS — M810 Age-related osteoporosis without current pathological fracture: Secondary | ICD-10-CM | POA: Insufficient documentation

## 2014-06-16 ENCOUNTER — Encounter (HOSPITAL_COMMUNITY)
Admission: RE | Admit: 2014-06-16 | Discharge: 2014-06-16 | Disposition: A | Discharge status: Home / Self Care | Payer: Self-pay | Source: Ambulatory Visit | Attending: Pulmonary Disease | Admitting: Pulmonary Disease

## 2014-06-21 ENCOUNTER — Encounter (HOSPITAL_COMMUNITY): Payer: Self-pay

## 2014-06-23 ENCOUNTER — Encounter (HOSPITAL_COMMUNITY)
Admission: RE | Admit: 2014-06-23 | Discharge: 2014-06-23 | Disposition: A | Discharge status: Home / Self Care | Payer: Self-pay | Source: Ambulatory Visit | Attending: Pulmonary Disease | Admitting: Pulmonary Disease

## 2014-06-28 ENCOUNTER — Encounter (HOSPITAL_COMMUNITY)
Admission: RE | Admit: 2014-06-28 | Discharge: 2014-06-28 | Disposition: A | Discharge status: Home / Self Care | Payer: Self-pay | Source: Ambulatory Visit | Attending: Pulmonary Disease | Admitting: Pulmonary Disease

## 2014-06-28 ENCOUNTER — Other Ambulatory Visit: Payer: Self-pay | Admitting: Internal Medicine

## 2014-06-28 NOTE — Telephone Encounter (Signed)
Pt request refill for hydrocodone. Please advise.

## 2014-06-29 ENCOUNTER — Other Ambulatory Visit: Payer: Self-pay | Admitting: Internal Medicine

## 2014-06-29 MED ORDER — HYDROCODONE-ACETAMINOPHEN 7.5-325 MG PO TABS: 1.0000 | ORAL_TABLET | Freq: Four times a day (QID) | ORAL | Status: DC | PRN

## 2014-06-29 NOTE — Telephone Encounter (Signed)
Ok - please print and i will sign

## 2014-06-29 NOTE — Telephone Encounter (Signed)
This has been addressed by other means in last 24h

## 2014-06-29 NOTE — Telephone Encounter (Signed)
Is requesting script for hydrocodone.   °

## 2014-06-30 ENCOUNTER — Ambulatory Visit: Payer: Medicare Other | Admitting: Internal Medicine

## 2014-06-30 ENCOUNTER — Encounter (HOSPITAL_COMMUNITY)
Admission: RE | Admit: 2014-06-30 | Discharge: 2014-06-30 | Disposition: A | Discharge status: Home / Self Care | Payer: Self-pay | Source: Ambulatory Visit | Attending: Pulmonary Disease | Admitting: Pulmonary Disease

## 2014-07-05 ENCOUNTER — Encounter (HOSPITAL_COMMUNITY)
Admission: RE | Admit: 2014-07-05 | Discharge: 2014-07-05 | Disposition: A | Discharge status: Home / Self Care | Payer: Self-pay | Source: Ambulatory Visit | Attending: Pulmonary Disease | Admitting: Pulmonary Disease

## 2014-07-07 ENCOUNTER — Encounter (HOSPITAL_COMMUNITY)
Admission: RE | Admit: 2014-07-07 | Discharge: 2014-07-07 | Disposition: A | Discharge status: Home / Self Care | Payer: Self-pay | Source: Ambulatory Visit | Attending: Pulmonary Disease | Admitting: Pulmonary Disease

## 2014-07-07 ENCOUNTER — Ambulatory Visit: Payer: Medicare Other

## 2014-07-11 ENCOUNTER — Ambulatory Visit (INDEPENDENT_AMBULATORY_CARE_PROVIDER_SITE_OTHER): Payer: Medicare Other

## 2014-07-11 DIAGNOSIS — J45909 Unspecified asthma, uncomplicated: Secondary | ICD-10-CM | POA: Diagnosis not present

## 2014-07-12 ENCOUNTER — Encounter (HOSPITAL_COMMUNITY): Payer: Medicare Other

## 2014-07-12 DIAGNOSIS — G4730 Sleep apnea, unspecified: Secondary | ICD-10-CM | POA: Insufficient documentation

## 2014-07-12 DIAGNOSIS — K219 Gastro-esophageal reflux disease without esophagitis: Secondary | ICD-10-CM | POA: Insufficient documentation

## 2014-07-12 DIAGNOSIS — Z5189 Encounter for other specified aftercare: Secondary | ICD-10-CM | POA: Insufficient documentation

## 2014-07-12 DIAGNOSIS — J449 Chronic obstructive pulmonary disease, unspecified: Secondary | ICD-10-CM | POA: Insufficient documentation

## 2014-07-12 DIAGNOSIS — M810 Age-related osteoporosis without current pathological fracture: Secondary | ICD-10-CM | POA: Insufficient documentation

## 2014-07-12 DIAGNOSIS — J4489 Other specified chronic obstructive pulmonary disease: Secondary | ICD-10-CM | POA: Insufficient documentation

## 2014-07-12 MED ORDER — OMALIZUMAB 150 MG ~~LOC~~ SOLR
300.0000 mg | Freq: Once | SUBCUTANEOUS | Status: AC
  Administered 2014-07-12: 300 mg via SUBCUTANEOUS

## 2014-07-14 ENCOUNTER — Encounter (HOSPITAL_COMMUNITY): Payer: Self-pay

## 2014-07-19 ENCOUNTER — Encounter (HOSPITAL_COMMUNITY)
Admission: RE | Admit: 2014-07-19 | Discharge: 2014-07-19 | Disposition: A | Discharge status: Home / Self Care | Payer: Self-pay | Source: Ambulatory Visit | Attending: Pulmonary Disease | Admitting: Pulmonary Disease

## 2014-07-21 ENCOUNTER — Encounter (HOSPITAL_COMMUNITY)
Admission: RE | Admit: 2014-07-21 | Discharge: 2014-07-21 | Disposition: A | Discharge status: Home / Self Care | Payer: Self-pay | Source: Ambulatory Visit | Attending: Pulmonary Disease | Admitting: Pulmonary Disease

## 2014-07-22 ENCOUNTER — Ambulatory Visit (INDEPENDENT_AMBULATORY_CARE_PROVIDER_SITE_OTHER): Payer: Medicare Other | Admitting: Internal Medicine

## 2014-07-22 ENCOUNTER — Encounter: Payer: Self-pay | Admitting: Internal Medicine

## 2014-07-22 VITALS — BP 120/70 | HR 65 | Temp 98.1°F | Ht 64.0 in | Wt 162.0 lb

## 2014-07-22 DIAGNOSIS — J45909 Unspecified asthma, uncomplicated: Secondary | ICD-10-CM | POA: Diagnosis not present

## 2014-07-22 DIAGNOSIS — J4550 Severe persistent asthma, uncomplicated: Secondary | ICD-10-CM

## 2014-07-22 NOTE — Progress Notes (Signed)
Subjective:    Patient ID: [name] [name], female    DOB: 1941/02/18, 73 y.o.   MRN: [id number]  HPI  1. ASTHMA - Baseline Chronic High Risk (multiple admits), Allergic, Moderate-Severe Persistent Asthma  - Allergies, gerd, sinus exacerbators.  - Frequent prednisone -> daily low dose prednisone since FEb 20112  - PFT 11/06/2010 - Mixed obstruction - restriction - fev1 1L/50%, Ratio 54, 24% BD response, TLC 3.6/78%, DLCO 16/75%  - May 2012: Switched asthma care to [name]  - June 2012 PFT - showed normalization with prednisone: PFts 04/18/2011 shows NORMALIZATION (fev1 1.8L/93%, ratio 68, 9% BD response, DLCO 86).  - August 2012: Fev1: 1.1lL/58% -> prednisone burst-> 1.09L/60%, Ratio 65 (no  - Nov 2012: Fev1 0.97L/51%, Ratio 60 -> pred burst  - Dec 2012: Fev1 0.7L/37%, Ratio 67%  - August 2014: FEV1  1.17 L/62%  2. Bronchiectasis, (mild RUL) seen on CT chest - April 2012 ( ABPA wu tests on prednisone in June 2012: asp antibody IgE asp fumigatus 0.24 ku/L which is borderline/equivocal, IgG for all all aspergiluus preciptins - NEGATIVE. 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 Allergic rhinitis and allergic asthma.  - 2000s: - per hx strongly skin test positive at Dr Neldon Mc office  - 2004 - trialed xolair - per hx effective but developed rash which she subjectively attributed to twice daily dosing (thogh chart review 2004-2005 states it was stopped due to subjective lack of improvement and an incidental rash resolution was noted in retrospect after stopping xolair)  - 2009: IgE 306 in July 2009. RAST positive for various grass, oak, elm, ragweed, plantain, lamb etc., s/p Allergy shot trial with Dr Annamaria Boots - stopped due to transport issues  - 2012: May - IgE 588.  - 2012: June - moved allergy MD to Dr Hardie Pulley  - 2013: Restart Xolair    4. Lung nodule, 6 mm, on CT scan. - April 2012. Possibly calcified. RUL (no prior for  comparison but a 07/17/2000 CT chest reports simillar size nodule in similar location). Repeat CT needed April 2013   5. Gastroesophageal reflux disease with hx of UGI scope showing Hiatal hernia (small)  - delayed gastric empyting 2010 March  6. History of sleep apnea.- non compliant with CPAP   7. Significant anxiety (also mood lability hx with prednisone, worsening anxiety with albuterol)    8. Recurrent AE-ASthma  - Admitted 4/23-4/30/12  - OPD AE asthma Rx: July 2012, august 2012, Nov 2012  - Office treatment with Levaquin and prednisone-November 2013 -  Levaquin and prednisone -January 2014   #Obesity  - 192# - nov 2013. STart low glycemic diet  - 178#  - 03/10/2013. Body mass index is 31.03 kg/(m^2). - 175# - 04/08/13 - 168# - 05/12/2013 with Body mass index is 28.93 kg/(m^2). - 167# on 07/07/2013 with Body mass index is 28.65 kg/(m^2). -  154# on 10/18/2013 wuth Body mass index is 26.42 kg/(m^2).  - Body mass index is 27.79 kg/(m^2). on 07/22/2014     OV 07/22/2014  Chief Complaint  Patient presents with  . Asthma    follow-up. Pt is having increased SOB, wheezing, chest tightness, increase cough at night.    Fu severe persistent asthma on Symbicort, Xolair, Singulair, daily prednisone between 2 and 5 mg. Non-compliance suspected - 2nd opinion at [place] 3 month followup. Last OV June 2015. She continues to insist she  is compliant but her facial expression when she says that makes one doubt true compliance. Says heat bothers her and asthma not doing well but declined pred burst. 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  We discussed retrial of a LAMA; she had intolrance to spiriva before but willing to try the respimat format. No new issues.    Review of Systems  Constitutional: Negative for fever and unexpected weight change.  HENT: Negative for congestion, dental problem, ear pain, nosebleeds, postnasal drip, rhinorrhea, sinus pressure, sneezing, sore throat and trouble  swallowing.   Eyes: Negative for redness and itching.  Respiratory: Positive for cough, chest tightness, shortness of breath and wheezing.   Cardiovascular: Negative for palpitations and leg swelling.  Gastrointestinal: Negative for nausea and vomiting.  Genitourinary: Negative for dysuria.  Musculoskeletal: Negative for joint swelling.  Skin: Negative for rash.  Neurological: Negative for headaches.  Hematological: Does not bruise/bleed easily.  Psychiatric/Behavioral: Negative for dysphoric mood. The patient is not nervous/anxious.        Objective:   Physical Exam  Filed Vitals:   07/22/14 1358  BP: 120/70  Pulse: 65  Temp: 98.1 F (36.7 C)  TempSrc: Oral  Height: 5\' 4"  (1.626 m)  Weight: 162 lb (73.483 kg)  SpO2: 99%     Constitutional: She is oriented to person, place, and time. She appears well-developed and well-nourished. No distress.  HENT:  Head: Normocephalic and atraumatic.  Right Ear: External ear normal.  Left Ear: External ear normal.  Mouth/Throat: Oropharynx is clear and moist. No oropharyngeal exudate.  Eyes: Conjunctivae and EOM are normal. Pupils are equal, round, and reactive to light. Right eye exhibits no discharge. Left eye exhibits no discharge. No scleral icterus.  Neck: Normal range of motion. Neck supple. No JVD present. No tracheal deviation present. No thyromegaly present.  Cardiovascular: Normal rate, regular rhythm, normal heart sounds and intact distal pulses.  Exam reveals no gallop and no friction rub.   No murmur heard. Pulmonary/Chest: Effort normal. No respiratory distress. She has  NO wheezes. (last visit - >coarse wheezes present bialterally) Shhas no rales. She exhibits no tenderness.  Abdominal: Soft. Bowel sounds are normal. She exhibits no distension and no mass. There is no tenderness. There is no rebound and no guarding.  Musculoskeletal: Normal range of motion. She exhibits no edema and no tenderness.  Lymphadenopathy:    She  has no cervical adenopathy.  Neurological: She is alert and oriented to person, place, and time. She has normal reflexes. No cranial nerve deficit. She exhibits normal muscle tone. Coordination normal.  Skin: Skin is warm and dry. No rash noted. She is not diaphoretic. No erythema. No pallor.  Psychiatric: She has a normal mood and affect. Her behavior is normal. Judgment and thought content normal.          Assessment & Plan:  Asthma appears stable  But poorly controlled  Continue current medications; symbicort, xolair, singulair, daily prednisone 2-5mg   Let us give spiriva another try  - try spiriva respimat 2 puff once daily Keep sinus under control through Dr Constance Holster and saline spray Keep reflux under control with nexium and low glycemic diet IF asthma flares up, call us for prednisone burst  Followup IF worse, call us Followup 3 months or sooner Spirometry in office at followup (not PFT by June Leap)

## 2014-07-22 NOTE — Patient Instructions (Signed)
Asthma appears stable  But poorly controlled  Continue current medications; symbicort, xolair, singulair, daily prednisone 2-5mg   Let us give spiriva another try  - try spiriva respimat 2 puff once daily Keep sinus under control through Dr Constance Holster and saline spray Keep reflux under control with nexium and low glycemic diet IF asthma flares up, call us for prednisone burst  Followup IF worse, call us Followup 3 months or sooner Spirometry in office at followup (not PFT by June Leap)

## 2014-07-26 ENCOUNTER — Encounter (HOSPITAL_COMMUNITY)
Admission: RE | Admit: 2014-07-26 | Discharge: 2014-07-26 | Disposition: A | Discharge status: Home / Self Care | Payer: Self-pay | Source: Ambulatory Visit | Attending: Pulmonary Disease | Admitting: Pulmonary Disease

## 2014-07-28 ENCOUNTER — Encounter (HOSPITAL_COMMUNITY): Payer: Self-pay

## 2014-08-01 ENCOUNTER — Telehealth: Payer: Self-pay | Admitting: Internal Medicine

## 2014-08-01 ENCOUNTER — Other Ambulatory Visit: Payer: Self-pay

## 2014-08-01 MED ORDER — HYDROCODONE-ACETAMINOPHEN 7.5-325 MG PO TABS: 1.0000 | ORAL_TABLET | Freq: Four times a day (QID) | ORAL | Status: DC | PRN

## 2014-08-01 NOTE — Telephone Encounter (Signed)
LVM for pt to call back.    RE: Rx ready for pick up in the office.

## 2014-08-01 NOTE — Telephone Encounter (Signed)
Patient is requesting script for hydrocodone.   °

## 2014-08-02 ENCOUNTER — Encounter (HOSPITAL_COMMUNITY)
Admission: RE | Admit: 2014-08-02 | Discharge: 2014-08-02 | Disposition: A | Discharge status: Home / Self Care | Payer: Self-pay | Source: Ambulatory Visit | Attending: Pulmonary Disease | Admitting: Pulmonary Disease

## 2014-08-03 ENCOUNTER — Other Ambulatory Visit: Payer: Self-pay | Admitting: Internal Medicine

## 2014-08-04 ENCOUNTER — Encounter (HOSPITAL_COMMUNITY)
Admission: RE | Admit: 2014-08-04 | Discharge: 2014-08-04 | Disposition: A | Discharge status: Home / Self Care | Payer: Self-pay | Source: Ambulatory Visit | Attending: Pulmonary Disease | Admitting: Pulmonary Disease

## 2014-08-08 ENCOUNTER — Ambulatory Visit: Payer: Medicare Other

## 2014-08-09 ENCOUNTER — Encounter (HOSPITAL_COMMUNITY): Payer: Self-pay

## 2014-08-10 ENCOUNTER — Ambulatory Visit (INDEPENDENT_AMBULATORY_CARE_PROVIDER_SITE_OTHER): Payer: Medicare Other

## 2014-08-10 DIAGNOSIS — J45909 Unspecified asthma, uncomplicated: Secondary | ICD-10-CM

## 2014-08-10 DIAGNOSIS — J4550 Severe persistent asthma, uncomplicated: Secondary | ICD-10-CM

## 2014-08-10 MED ORDER — OMALIZUMAB 150 MG ~~LOC~~ SOLR
300.0000 mg | Freq: Once | SUBCUTANEOUS | Status: AC
  Administered 2014-08-10: 300 mg via SUBCUTANEOUS

## 2014-08-11 ENCOUNTER — Encounter (HOSPITAL_COMMUNITY): Payer: Medicare Other

## 2014-08-11 DIAGNOSIS — K219 Gastro-esophageal reflux disease without esophagitis: Secondary | ICD-10-CM | POA: Insufficient documentation

## 2014-08-11 DIAGNOSIS — G4730 Sleep apnea, unspecified: Secondary | ICD-10-CM | POA: Insufficient documentation

## 2014-08-11 DIAGNOSIS — Z5189 Encounter for other specified aftercare: Secondary | ICD-10-CM | POA: Insufficient documentation

## 2014-08-11 DIAGNOSIS — J449 Chronic obstructive pulmonary disease, unspecified: Secondary | ICD-10-CM | POA: Insufficient documentation

## 2014-08-11 DIAGNOSIS — M810 Age-related osteoporosis without current pathological fracture: Secondary | ICD-10-CM | POA: Insufficient documentation

## 2014-08-11 DIAGNOSIS — J45909 Unspecified asthma, uncomplicated: Secondary | ICD-10-CM | POA: Insufficient documentation

## 2014-08-16 ENCOUNTER — Encounter (HOSPITAL_COMMUNITY): Payer: Self-pay

## 2014-08-18 ENCOUNTER — Encounter (HOSPITAL_COMMUNITY): Payer: Self-pay

## 2014-08-23 ENCOUNTER — Encounter (HOSPITAL_COMMUNITY)
Admission: RE | Admit: 2014-08-23 | Discharge: 2014-08-23 | Disposition: A | Discharge status: Home / Self Care | Payer: Self-pay | Source: Ambulatory Visit | Attending: Pulmonary Disease | Admitting: Pulmonary Disease

## 2014-08-25 ENCOUNTER — Encounter (HOSPITAL_COMMUNITY): Payer: Self-pay

## 2014-08-29 ENCOUNTER — Telehealth: Payer: Self-pay | Admitting: Internal Medicine

## 2014-08-29 MED ORDER — HYDROCODONE-ACETAMINOPHEN 7.5-325 MG PO TABS: 1.0000 | ORAL_TABLET | Freq: Four times a day (QID) | ORAL | Status: DC | PRN

## 2014-08-29 NOTE — Telephone Encounter (Signed)
Notified pt rx ready for pick-up.../lmb 

## 2014-08-29 NOTE — Telephone Encounter (Signed)
Pt called in request her refill for HYDROcodone-acetaminophen (NORCO) 7.5-325 MG per tablet [[id number]] Last filled 08/01/2014.    Best number to reach her is [phone]

## 2014-08-30 ENCOUNTER — Encounter (HOSPITAL_COMMUNITY): Payer: Self-pay

## 2014-09-01 ENCOUNTER — Encounter (HOSPITAL_COMMUNITY): Payer: Self-pay

## 2014-09-05 ENCOUNTER — Other Ambulatory Visit: Payer: Self-pay

## 2014-09-05 MED ORDER — AMLODIPINE BESYLATE 5 MG PO TABS
5.0000 mg | ORAL_TABLET | Freq: Every morning | ORAL | Status: DC
Start: 2014-09-05 — End: 2015-08-23

## 2014-09-05 MED ORDER — OLOPATADINE HCL 0.1 % OP SOLN: 1.0000 [drp] | Freq: Two times a day (BID) | OPHTHALMIC | Status: DC

## 2014-09-05 MED ORDER — CYCLOSPORINE 0.05 % OP EMUL: 1.0000 [drp] | Freq: Two times a day (BID) | OPHTHALMIC | Status: DC

## 2014-09-06 ENCOUNTER — Encounter (HOSPITAL_COMMUNITY)
Admission: RE | Admit: 2014-09-06 | Discharge: 2014-09-06 | Disposition: A | Discharge status: Home / Self Care | Payer: Self-pay | Source: Ambulatory Visit | Attending: Pulmonary Disease | Admitting: Pulmonary Disease

## 2014-09-07 ENCOUNTER — Ambulatory Visit: Payer: Medicare Other

## 2014-09-08 ENCOUNTER — Encounter (HOSPITAL_COMMUNITY)
Admission: RE | Admit: 2014-09-08 | Discharge: 2014-09-08 | Disposition: A | Discharge status: Home / Self Care | Payer: Self-pay | Source: Ambulatory Visit | Attending: Pulmonary Disease | Admitting: Pulmonary Disease

## 2014-09-09 ENCOUNTER — Ambulatory Visit (INDEPENDENT_AMBULATORY_CARE_PROVIDER_SITE_OTHER): Payer: Medicare Other

## 2014-09-09 DIAGNOSIS — J4550 Severe persistent asthma, uncomplicated: Secondary | ICD-10-CM | POA: Diagnosis not present

## 2014-09-13 ENCOUNTER — Encounter (HOSPITAL_COMMUNITY)
Admission: RE | Admit: 2014-09-13 | Discharge: 2014-09-13 | Disposition: A | Discharge status: Home / Self Care | Payer: Self-pay | Source: Ambulatory Visit | Attending: Pulmonary Disease | Admitting: Pulmonary Disease

## 2014-09-13 DIAGNOSIS — J449 Chronic obstructive pulmonary disease, unspecified: Secondary | ICD-10-CM | POA: Insufficient documentation

## 2014-09-13 DIAGNOSIS — J45909 Unspecified asthma, uncomplicated: Secondary | ICD-10-CM | POA: Insufficient documentation

## 2014-09-13 DIAGNOSIS — Z5189 Encounter for other specified aftercare: Secondary | ICD-10-CM | POA: Insufficient documentation

## 2014-09-13 DIAGNOSIS — M810 Age-related osteoporosis without current pathological fracture: Secondary | ICD-10-CM | POA: Insufficient documentation

## 2014-09-13 DIAGNOSIS — K219 Gastro-esophageal reflux disease without esophagitis: Secondary | ICD-10-CM | POA: Insufficient documentation

## 2014-09-13 DIAGNOSIS — G4730 Sleep apnea, unspecified: Secondary | ICD-10-CM | POA: Insufficient documentation

## 2014-09-15 ENCOUNTER — Encounter (HOSPITAL_COMMUNITY): Payer: Self-pay

## 2014-09-19 MED ORDER — OMALIZUMAB 150 MG ~~LOC~~ SOLR
300.0000 mg | Freq: Once | SUBCUTANEOUS | Status: AC
  Administered 2014-09-19: 300 mg via SUBCUTANEOUS

## 2014-09-20 ENCOUNTER — Encounter (HOSPITAL_COMMUNITY): Payer: Self-pay

## 2014-09-21 ENCOUNTER — Ambulatory Visit: Payer: Medicare Other | Admitting: Internal Medicine

## 2014-09-22 ENCOUNTER — Encounter (HOSPITAL_COMMUNITY): Payer: Self-pay

## 2014-09-27 ENCOUNTER — Encounter (HOSPITAL_COMMUNITY)
Admission: RE | Admit: 2014-09-27 | Discharge: 2014-09-27 | Disposition: A | Discharge status: Home / Self Care | Payer: Self-pay | Source: Ambulatory Visit | Attending: Pulmonary Disease | Admitting: Pulmonary Disease

## 2014-09-29 ENCOUNTER — Encounter (HOSPITAL_COMMUNITY)
Admission: RE | Admit: 2014-09-29 | Discharge: 2014-09-29 | Disposition: A | Discharge status: Home / Self Care | Payer: Self-pay | Source: Ambulatory Visit | Attending: Pulmonary Disease | Admitting: Pulmonary Disease

## 2014-09-30 ENCOUNTER — Telehealth: Payer: Self-pay | Admitting: Internal Medicine

## 2014-09-30 ENCOUNTER — Ambulatory Visit: Payer: Medicare Other | Admitting: Internal Medicine

## 2014-09-30 NOTE — Telephone Encounter (Signed)
PT calling for monthly refill, Hydrocodone. If ok'd would like to p/u on Monday 11/23.

## 2014-10-03 ENCOUNTER — Other Ambulatory Visit: Payer: Self-pay

## 2014-10-03 ENCOUNTER — Other Ambulatory Visit: Payer: Self-pay | Admitting: Internal Medicine

## 2014-10-03 MED ORDER — HYDROCODONE-ACETAMINOPHEN 7.5-325 MG PO TABS: 1.0000 | ORAL_TABLET | Freq: Four times a day (QID) | ORAL | Status: DC | PRN

## 2014-10-03 NOTE — Telephone Encounter (Signed)
Pt aware to pick up rx 

## 2014-10-04 ENCOUNTER — Encounter (HOSPITAL_COMMUNITY)
Admission: RE | Admit: 2014-10-04 | Discharge: 2014-10-04 | Disposition: A | Discharge status: Home / Self Care | Payer: Self-pay | Source: Ambulatory Visit | Attending: Pulmonary Disease | Admitting: Pulmonary Disease

## 2014-10-06 ENCOUNTER — Encounter (HOSPITAL_COMMUNITY): Payer: Self-pay

## 2014-10-10 ENCOUNTER — Ambulatory Visit: Payer: Medicare Other

## 2014-10-11 ENCOUNTER — Encounter (HOSPITAL_COMMUNITY): Payer: Self-pay

## 2014-10-11 DIAGNOSIS — J449 Chronic obstructive pulmonary disease, unspecified: Secondary | ICD-10-CM | POA: Insufficient documentation

## 2014-10-11 DIAGNOSIS — J45909 Unspecified asthma, uncomplicated: Secondary | ICD-10-CM | POA: Insufficient documentation

## 2014-10-11 DIAGNOSIS — K219 Gastro-esophageal reflux disease without esophagitis: Secondary | ICD-10-CM | POA: Insufficient documentation

## 2014-10-11 DIAGNOSIS — M810 Age-related osteoporosis without current pathological fracture: Secondary | ICD-10-CM | POA: Insufficient documentation

## 2014-10-11 DIAGNOSIS — Z5189 Encounter for other specified aftercare: Secondary | ICD-10-CM | POA: Insufficient documentation

## 2014-10-11 DIAGNOSIS — G4730 Sleep apnea, unspecified: Secondary | ICD-10-CM | POA: Insufficient documentation

## 2014-10-12 ENCOUNTER — Ambulatory Visit (INDEPENDENT_AMBULATORY_CARE_PROVIDER_SITE_OTHER): Payer: Medicare Other

## 2014-10-12 DIAGNOSIS — J4520 Mild intermittent asthma, uncomplicated: Secondary | ICD-10-CM

## 2014-10-12 MED ORDER — OMALIZUMAB 150 MG ~~LOC~~ SOLR
300.0000 mg | Freq: Once | SUBCUTANEOUS | Status: AC
  Administered 2014-10-12: 300 mg via SUBCUTANEOUS

## 2014-10-13 ENCOUNTER — Encounter (HOSPITAL_COMMUNITY): Payer: Self-pay

## 2014-10-18 ENCOUNTER — Encounter (HOSPITAL_COMMUNITY): Payer: Self-pay

## 2014-10-20 ENCOUNTER — Encounter (HOSPITAL_COMMUNITY)
Admission: RE | Admit: 2014-10-20 | Discharge: 2014-10-20 | Disposition: A | Discharge status: Home / Self Care | Payer: Self-pay | Source: Ambulatory Visit | Attending: Pulmonary Disease | Admitting: Pulmonary Disease

## 2014-10-24 ENCOUNTER — Encounter: Payer: Self-pay | Admitting: Internal Medicine

## 2014-10-24 ENCOUNTER — Ambulatory Visit (INDEPENDENT_AMBULATORY_CARE_PROVIDER_SITE_OTHER): Payer: Medicare Other | Admitting: Internal Medicine

## 2014-10-24 VITALS — BP 134/78 | HR 62 | Temp 98.3°F | Ht 64.0 in | Wt 166.0 lb

## 2014-10-24 DIAGNOSIS — I10 Essential (primary) hypertension: Secondary | ICD-10-CM | POA: Diagnosis not present

## 2014-10-24 DIAGNOSIS — Z79899 Other long term (current) drug therapy: Secondary | ICD-10-CM | POA: Diagnosis not present

## 2014-10-24 DIAGNOSIS — M67912 Unspecified disorder of synovium and tendon, left shoulder: Secondary | ICD-10-CM

## 2014-10-24 DIAGNOSIS — Z5189 Encounter for other specified aftercare: Secondary | ICD-10-CM

## 2014-10-24 DIAGNOSIS — R52 Pain, unspecified: Secondary | ICD-10-CM

## 2014-10-24 DIAGNOSIS — Z79891 Long term (current) use of opiate analgesic: Secondary | ICD-10-CM | POA: Diagnosis not present

## 2014-10-24 HISTORY — DX: Unspecified disorder of synovium and tendon, left shoulder: M67.912

## 2014-10-24 MED ORDER — ASPIRIN EC 81 MG PO TBEC: 81.0000 mg | DELAYED_RELEASE_TABLET | Freq: Every day | ORAL | Status: DC

## 2014-10-24 NOTE — Patient Instructions (Signed)
It was good to see you today.  We have reviewed your prior records including labs and tests today  Medications reviewed and updated, start aspirin 81 mg every day - no other changes recommended at this time. Refill on medication(s) as discussed today.  we'll make referral to physical therapy for your shoulder pain. Our office will contact you regarding appointment(s) once made.  Please schedule followup in 6 months, call sooner if problems.

## 2014-10-24 NOTE — Progress Notes (Signed)
Subjective:    Patient ID: [name] [name], female    DOB: [birth year], 73 y.o.   MRN: [id number]  HPI  Patient is here for follow up - L arm pain x 1 mo since Xolaire injection 09/2014 Also reviewed chronic medical issues and interval medical events  Past Medical History  Diagnosis Date  . Sleep apnea   . ALLERGIC RHINITIS   . Colon polyp, hyperplastic 02/2003, 03/2014  . Chronic rhinosinusitis   . Hyperlipidemia   . Osteoporosis   . GERD (gastroesophageal reflux disease)   . Hiatal hernia   . Helicobacter pylori gastritis 02/2009    partially treated  . Gastroparesis 2010  . Chronic obstructive asthma     PFT 11/06/10 - FEV1 1.24/ 0.62; FEV1/FVC 0.56, TLC 0.78; DLCO 0.75  . Allergic asthma     h/o  . Bronchiectasis     h/o  . Hypertension   . COPD (chronic obstructive pulmonary disease)   . Anxiety     Review of Systems  Constitutional: Positive for fatigue. Negative for fever.  Respiratory: Negative for cough and shortness of breath.   Cardiovascular: Negative for chest pain and leg swelling.  Musculoskeletal: Positive for arthralgias (chronic). Negative for joint swelling.       Objective:   Physical Exam  BP 134/78 mmHg  Pulse 62  Temp(Src) 98.3 F (36.8 C) (Oral)  Ht 5\' 4"  (1.626 m)  Wt 166 lb (75.297 kg)  BMI 28.48 kg/m2  SpO2 99% Wt Readings from Last 3 Encounters:  10/24/14 166 lb (75.297 kg)  07/22/14 162 lb (73.483 kg)  05/09/14 160 lb 6.4 oz (72.757 kg)   Constitutional: She appears well-developed and well-nourished. No distress.  Neck: Normal range of motion. Neck supple. No JVD present. No thyromegaly present.  Cardiovascular: Normal rate, regular rhythm and normal heart sounds.  No murmur heard. No BLE edema. Pulmonary/Chest: Effort normal and breath sounds normal. No respiratory distress. She has no wheezes.  MSkel: L Shoulder: Full range of motion. Neurovascularly intact distally. Good strength with stress of rotator cuff but causes pain.  Positive impingement signs. Psychiatric: She has a normal mood and affect. Her behavior is normal. Judgment and thought content normal.   Lab Results  Component Value Date   WBC 7.6 04/15/2014   HGB 12.6 04/15/2014   HCT 39.1 04/15/2014   PLT 226.0 04/15/2014   GLUCOSE 98 04/15/2014   CHOL 204* 04/15/2014   TRIG 64.0 04/15/2014   HDL 55.90 04/15/2014   LDLDIRECT 133.2 02/24/2013   LDLCALC 135* 04/15/2014   ALT 16 04/15/2014   AST 26 04/15/2014   NA 140 04/15/2014   K 4.2 04/15/2014   CL 108 04/15/2014   CREATININE 1.1 04/15/2014   BUN 18 04/15/2014   CO2 26 04/15/2014   TSH 0.44 04/15/2014   HGBA1C 6.5 02/24/2013    No results found.     Assessment & Plan:   L shoulder impingement. Declines need for injection. 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

## 2014-10-24 NOTE — Assessment & Plan Note (Signed)
BP Readings from Last 3 Encounters:  10/24/14 134/78  07/22/14 120/70  05/09/14 130/80   History of same, labile and greatly affected by emotional state/anxiety per patient and daughter Patient reports usually well-controlled on current dose amlodipine and hctz Patient and family believe blood pressure are best controlled when anxiety controlled Patient to continue to monitor, will bring home blood pressure log to each OV The current medical regimen is effective;  continue present plan and medications.

## 2014-10-24 NOTE — Progress Notes (Signed)
Pre visit review using our clinic review tool, if applicable. No additional management support is needed unless otherwise documented below in the visit note. 

## 2014-10-24 NOTE — Assessment & Plan Note (Signed)
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

## 2014-10-25 ENCOUNTER — Encounter (HOSPITAL_COMMUNITY)
Admission: RE | Admit: 2014-10-25 | Discharge: 2014-10-25 | Disposition: A | Discharge status: Home / Self Care | Payer: Self-pay | Source: Ambulatory Visit | Attending: Pulmonary Disease | Admitting: Pulmonary Disease

## 2014-10-27 ENCOUNTER — Encounter (HOSPITAL_COMMUNITY)
Admission: RE | Admit: 2014-10-27 | Discharge: 2014-10-27 | Disposition: A | Discharge status: Home / Self Care | Payer: Self-pay | Source: Ambulatory Visit | Attending: Pulmonary Disease | Admitting: Pulmonary Disease

## 2014-10-28 ENCOUNTER — Ambulatory Visit (INDEPENDENT_AMBULATORY_CARE_PROVIDER_SITE_OTHER): Payer: Medicare Other | Admitting: Family

## 2014-10-28 ENCOUNTER — Telehealth: Payer: Self-pay | Admitting: Internal Medicine

## 2014-10-28 ENCOUNTER — Encounter: Payer: Self-pay | Admitting: Family

## 2014-10-28 ENCOUNTER — Encounter: Payer: Medicare Other | Admitting: Family

## 2014-10-28 VITALS — BP 160/84 | HR 63 | Temp 97.8°F | Resp 18 | Ht 64.5 in | Wt 166.4 lb

## 2014-10-28 DIAGNOSIS — I10 Essential (primary) hypertension: Secondary | ICD-10-CM

## 2014-10-28 DIAGNOSIS — Z0000 Encounter for general adult medical examination without abnormal findings: Secondary | ICD-10-CM | POA: Diagnosis not present

## 2014-10-28 DIAGNOSIS — J4550 Severe persistent asthma, uncomplicated: Secondary | ICD-10-CM

## 2014-10-28 DIAGNOSIS — K219 Gastro-esophageal reflux disease without esophagitis: Secondary | ICD-10-CM

## 2014-10-28 NOTE — Progress Notes (Signed)
Pre visit review using our clinic review tool, if applicable. No additional management support is needed unless otherwise documented below in the visit note. 

## 2014-10-28 NOTE — Assessment & Plan Note (Addendum)
Stable at present and maintained by [name] pulmonology. Continue current regimen of Symbicort, Singulair and Xolair.

## 2014-10-28 NOTE — Telephone Encounter (Signed)
lmtcb X1 for pt.  Samples placed up front for pt.

## 2014-10-28 NOTE — Assessment & Plan Note (Addendum)
Blood pressure remains labile. Continue current treatment of amlodipine and hydrochlorothiazide.

## 2014-10-28 NOTE — Patient Instructions (Addendum)
Thank you for choosing [facility].  Summary/Instructions:  Please stop by the lab on the basement level of the building for your blood work. Your results will be released to [place] (or called to you) after review, usually within 72hours after test completion. If any changes need to be made, you will be notified at that same time.  If your symptoms worsen or fail to improve, please contact our office for further instruction, or in case of emergency go directly to the emergency room at the closest medical facility.   Health Maintenance Adopting a healthy lifestyle and getting preventive care can go a long way to promote health and wellness. Talk with your health care provider about what schedule of regular examinations is right for you. This is a good chance for you to check in with your provider about disease prevention and staying healthy. In between checkups, there are plenty of things you can do on your own. Experts have done a lot of research about which lifestyle changes and preventive measures are most likely to keep you healthy. Ask your health care provider for more information. WEIGHT AND DIET  Eat a healthy diet  Be sure to include plenty of vegetables, fruits, low-fat dairy products, and lean protein.  Do not eat a lot of foods high in solid fats, added sugars, or salt.  Get regular exercise. This is one of the most important things you can do for your health.  Most adults should exercise for at least 150 minutes each week. The exercise should increase your heart rate and make you sweat (moderate-intensity exercise).  Most adults should also do strengthening exercises at least twice a week. This is in addition to the moderate-intensity exercise.  Maintain a healthy weight  Body mass index (BMI) is a measurement that can be used to identify possible weight problems. It estimates body fat based on height and weight. Your health care provider can help determine your BMI and  help you achieve or maintain a healthy weight.  For females 56 years of age and older:   A BMI below 18.5 is considered underweight.  A BMI of 18.5 to 24.9 is normal.  A BMI of 25 to 29.9 is considered overweight.  A BMI of 30 and above is considered obese.  Watch levels of cholesterol and blood lipids  You should start having your blood tested for lipids and cholesterol at 71 years of age, then have this test every 5 years.  You may need to have your cholesterol levels checked more often if:  Your lipid or cholesterol levels are high.  You are older than 71 years of age.  You are at high risk for heart disease.  CANCER SCREENING   Lung Cancer  Lung cancer screening is recommended for adults 27-29 years old who are at high risk for lung cancer because of a history of smoking.  A yearly low-dose CT scan of the lungs is recommended for people who:  Currently smoke.  Have quit within the past 15 years.  Have at least a 30-pack-year history of smoking. A pack year is smoking an average of one pack of cigarettes a day for 1 year.  Yearly screening should continue until it has been 15 years since you quit.  Yearly screening should stop if you develop a health problem that would prevent you from having lung cancer treatment.  Breast Cancer  Practice breast self-awareness. This means understanding how your breasts normally appear and feel.  It also means  doing regular breast self-exams. Let your health care provider know about any changes, no matter how small.  If you are in your 20s or 30s, you should have a clinical breast exam (CBE) by a health care provider every 1-3 years as part of a regular health exam.  If you are 32 or older, have a CBE every year. Also consider having a breast X-ray (mammogram) every year.  If you have a family history of breast cancer, talk to your health care provider about genetic screening.  If you are at high risk for breast cancer, talk  to your health care provider about having an MRI and a mammogram every year.  Breast cancer gene (BRCA) assessment is recommended for women who have family members with BRCA-related cancers. BRCA-related cancers include:  Breast.  Ovarian.  Tubal.  Peritoneal cancers.  Results of the assessment will determine the need for genetic counseling and BRCA1 and BRCA2 testing. Cervical Cancer Routine pelvic examinations to screen for cervical cancer are no longer recommended for nonpregnant women who are considered low risk for cancer of the pelvic organs (ovaries, uterus, and vagina) and who do not have symptoms. A pelvic examination may be necessary if you have symptoms including those associated with pelvic infections. Ask your health care provider if a screening pelvic exam is right for you.   The Pap test is the screening test for cervical cancer for women who are considered at risk.  If you had a hysterectomy for a problem that was not cancer or a condition that could lead to cancer, then you no longer need Pap tests.  If you are older than 65 years, and you have had normal Pap tests for the past 10 years, you no longer need to have Pap tests.  If you have had past treatment for cervical cancer or a condition that could lead to cancer, you need Pap tests and screening for cancer for at least 20 years after your treatment.  If you no longer get a Pap test, assess your risk factors if they change (such as having a new sexual partner). This can affect whether you should start being screened again.  Some women have medical problems that increase their chance of getting cervical cancer. If this is the case for you, your health care provider may recommend more frequent screening and Pap tests.  The human papillomavirus (HPV) test is another test that may be used for cervical cancer screening. The HPV test looks for the virus that can cause cell changes in the cervix. The cells collected during  the Pap test can be tested for HPV.  The HPV test can be used to screen women 71 years of age and older. Getting tested for HPV can extend the interval between normal Pap tests from three to five years.  An HPV test also should be used to screen women of any age who have unclear Pap test results.  After 71 years of age, women should have HPV testing as often as Pap tests.  Colorectal Cancer  This type of cancer can be detected and often prevented.  Routine colorectal cancer screening usually begins at 70 years of age and continues through 71 years of age.  Your health care provider may recommend screening at an earlier age if you have risk factors for colon cancer.  Your health care provider may also recommend using home test kits to check for hidden blood in the stool.  A small camera at the end of a  tube can be used to examine your colon directly (sigmoidoscopy or colonoscopy). This is done to check for the earliest forms of colorectal cancer.  Routine screening usually begins at age 72.  Direct examination of the colon should be repeated every 5-10 years through 71 years of age. However, you may need to be screened more often if early forms of precancerous polyps or small growths are found. Skin Cancer  Check your skin from head to toe regularly.  Tell your health care provider about any new moles or changes in moles, especially if there is a change in a mole's shape or color.  Also tell your health care provider if you have a mole that is larger than the size of a pencil eraser.  Always use sunscreen. Apply sunscreen liberally and repeatedly throughout the day.  Protect yourself by wearing long sleeves, pants, a wide-brimmed hat, and sunglasses whenever you are outside. HEART DISEASE, DIABETES, AND HIGH BLOOD PRESSURE   Have your blood pressure checked at least every 1-2 years. High blood pressure causes heart disease and increases the risk of stroke.  If you are between 88  years and 17 years old, ask your health care provider if you should take aspirin to prevent strokes.  Have regular diabetes screenings. This involves taking a blood sample to check your fasting blood sugar level.  If you are at a normal weight and have a low risk for diabetes, have this test once every three years after 71 years of age.  If you are overweight and have a high risk for diabetes, consider being tested at a younger age or more often. PREVENTING INFECTION  Hepatitis B  If you have a higher risk for hepatitis B, you should be screened for this virus. You are considered at high risk for hepatitis B if:  You were born in a country where hepatitis B is common. Ask your health care provider which countries are considered high risk.  Your parents were born in a high-risk country, and you have not been immunized against hepatitis B (hepatitis B vaccine).  You have HIV or AIDS.  You use needles to inject street drugs.  You live with someone who has hepatitis B.  You have had sex with someone who has hepatitis B.  You get hemodialysis treatment.  You take certain medicines for conditions, including cancer, organ transplantation, and autoimmune conditions. Hepatitis C  Blood testing is recommended for:  Everyone born from [birth year] through 1963.  Anyone with known risk factors for hepatitis C. Sexually transmitted infections (STIs)  You should be screened for sexually transmitted infections (STIs) including gonorrhea and chlamydia if:  You are sexually active and are younger than 71 years of age.  You are older than 71 years of age and your health care provider tells you that you are at risk for this type of infection.  Your sexual activity has changed since you were last screened and you are at an increased risk for chlamydia or gonorrhea. Ask your health care provider if you are at risk.  If you do not have HIV, but are at risk, it may be recommended that you take a  prescription medicine daily to prevent HIV infection. This is called pre-exposure prophylaxis (PrEP). You are considered at risk if:  You are sexually active and do not regularly use condoms or know the HIV status of your partner(s).  You take drugs by injection.  You are sexually active with a partner who has HIV. Talk with  your health care provider about whether you are at high risk of being infected with HIV. If you choose to begin PrEP, you should first be tested for HIV. You should then be tested every 3 months for as long as you are taking PrEP.  PREGNANCY   If you are premenopausal and you may become pregnant, ask your health care provider about preconception counseling.  If you may become pregnant, take 400 to 800 micrograms (mcg) of folic acid every day.  If you want to prevent pregnancy, talk to your health care provider about birth control (contraception). OSTEOPOROSIS AND MENOPAUSE   Osteoporosis is a disease in which the bones lose minerals and strength with aging. This can result in serious bone fractures. Your risk for osteoporosis can be identified using a bone density scan.  If you are 27 years of age or older, or if you are at risk for osteoporosis and fractures, ask your health care provider if you should be screened.  Ask your health care provider whether you should take a calcium or vitamin D supplement to lower your risk for osteoporosis.  Menopause may have certain physical symptoms and risks.  Hormone replacement therapy may reduce some of these symptoms and risks. Talk to your health care provider about whether hormone replacement therapy is right for you.  HOME CARE INSTRUCTIONS   Schedule regular health, dental, and eye exams.  Stay current with your immunizations.   Do not use any tobacco products including cigarettes, chewing tobacco, or electronic cigarettes.  If you are pregnant, do not drink alcohol.  If you are breastfeeding, limit how much and  how often you drink alcohol.  Limit alcohol intake to no more than 1 drink per day for nonpregnant women. One drink equals 12 ounces of beer, 5 ounces of wine, or 1 ounces of hard liquor.  Do not use street drugs.  Do not share needles.  Ask your health care provider for help if you need support or information about quitting drugs.  Tell your health care provider if you often feel depressed.  Tell your health care provider if you have ever been abused or do not feel safe at home. Document Released: 05/13/2011 Document Revised: 03/14/2014 Document Reviewed: 09/29/2013 [facility] Patient Information 2015 [place], [place]. This information is not intended to replace advice given to you by your health care provider. Make sure you discuss any questions you have with your health care provider.

## 2014-10-28 NOTE — Assessment & Plan Note (Addendum)
Stable with current regimen. Continue Protonix as prescribed.

## 2014-10-28 NOTE — Assessment & Plan Note (Signed)
1) Anticipatory Guidance: Discussed importance of wearing a seatbelt while driving and not texting while driving; changing batteries in smoke detector at least once annually; wearing suntan lotion when outside; eating a balanced and moderate diet; getting physical activity at least 30 minutes per day.  2) Immunizations / Screenings / Labs:  Patient declined shingles vaccination. Unable to have influenza vaccination secondary to allergy. All other vaccinations are up-to-date. All screenings are up-to-date per recommendations. Obtain CBC, BMET, Lipid profile.  Overall well exam. Discussed risk factors of older age and hypertension with patient. Updated medical, surgical, family, and social histories as needed. Updated and reviewed relevant prevention and and safety. Follow-up preventative exam in one year.

## 2014-10-28 NOTE — Telephone Encounter (Signed)
Patient called back.  Informed patient samples were up front and ready for pick up.

## 2014-10-28 NOTE — Progress Notes (Signed)
Subjective:   Patient ID: [name] [name], female    DOB: [birth year], 73 y.o.   MRN: [id number]   [name] [name] is a 73 y.o. @GENDER @ who presents for Medicare Annual/Subsequent preventive examination.   PREVENTATIVE SCREENING-COUNSELING & MANAGEMENT  Problem List  1. Hypertension - currently maintained on amlodopine and hydrochlorothiazide.  Denies any changes in vision, chest pain/discomfort, heart palpitations, or edema.   2. Asthma - Currently maintained on symbicort, singulair and zolair. Is requesting samples of Symbicort  3. GERD - currently taking nexium. Indicates well controlled unless she eats wrong.    Allergies  Allergen Reactions  . 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 Latex Anaphylaxis  . Advair Diskus [Fluticasone-Salmeterol]     Tingling in mouth/ears ringing  . Diclofenac Sodium     REACTION: Hives  . Doxycycline Nausea And Vomiting  . Dulera [Mometasone Furo-Formoterol Fum]     HA  . Penicillins     Tongue swelling  . Sulfonamide Derivatives     Tongue swelling   . Tiotropium Bromide Monohydrate     Tongue/mouth itching Dysuria   . Albuterol Anxiety    Switched to xopenex    Current Outpatient Prescriptions on File Prior to Visit  Medication Sig Dispense Refill  . ALPRAZolam (XANAX) 0.25 MG tablet Take 1 tablet (0.25 mg total) by mouth 2 (two) times daily. 60 tablet 3  . amLODipine (NORVASC) 5 MG tablet Take 1 tablet (5 mg total) by mouth every morning. 90 tablet 3  . aspirin EC 81 MG tablet Take 1 tablet (81 mg total) by mouth daily. 150 tablet 2  . budesonide-formoterol (SYMBICORT) 160-4.5 MCG/ACT inhaler Inhale 2 puffs into the lungs 2 (two) times daily. 3 Inhaler 3  . cycloSPORINE (RESTASIS) 0.05 % ophthalmic emulsion Place 1 drop into both eyes 2 (two) times daily. 0.4 mL 0  . desloratadine (CLARINEX) 5 MG tablet Take 1 tablet (5 mg total) by mouth daily. 90 tablet 3  . EPINEPHrine (EPIPEN IJ) Inject as directed as  needed (allergic reaction).    Marland Kitchen esomeprazole (NEXIUM) 40 MG capsule Take 1 capsule (40 mg total) by mouth daily at 12 noon. 90 capsule 3  . hydrochlorothiazide (HYDRODIURIL) 25 MG tablet Take 1 tablet (25 mg total) by mouth every morning. 90 tablet 3  . HYDROcodone-acetaminophen (NORCO) 7.5-325 MG per tablet Take 1 tablet by mouth every 6 (six) hours as needed for moderate pain. 90 tablet 0  . levalbuterol (XOPENEX HFA) 45 MCG/ACT inhaler Inhale 1 puff into the lungs every 4 (four) hours as needed for wheezing.    . levalbuterol (XOPENEX) 0.63 MG/3ML nebulizer solution Take 0.63 mg by nebulization every 4 (four) hours as needed for wheezing or shortness of breath.    . montelukast (SINGULAIR) 10 MG tablet Take 1 tablet (10 mg total) by mouth at bedtime. 90 tablet 3  . olopatadine (PATANOL) 0.1 % ophthalmic solution Place 1 drop into both eyes 2 (two) times daily. 5 mL 3  . omalizumab (XOLAIR) 150 MG injection Inject 300 mg into the skin every 28 (twenty-eight) days.    . predniSONE (DELTASONE) 1 MG tablet CAN TAKE 2-5 TABLETS DAILY AS NEEDED 100 tablet 3  . predniSONE (DELTASONE) 2.5 MG tablet Take 2.5 mg by mouth daily with breakfast. Takes this most days but if breathing is bad she takes 5 mg    . Vitamin D, Ergocalciferol, (DRISDOL) 50000 UNITS CAPS capsule TAKE 1 CAPSULE (50,000 UNITS TOTAL)  BY MOUTH EVERY 7 (SEVEN) DAYS. 12 capsule 1   No current facility-administered medications on file prior to visit.    Screening Tests Health Maintenance  Topic Date Due  . ZOSTAVAX  02/16/2003  . INFLUENZA VACCINE  01/25/2015 (Originally 06/11/2014)  . TETANUS/TDAP  01/10/2015  . MAMMOGRAM  04/18/2016  . COLONOSCOPY  03/29/2024  . DEXA SCAN  Completed  . PNEUMOCOCCAL POLYSACCHARIDE VACCINE AGE 6 AND OVER  Completed  Declines shingles vaccination.   Immunizations Immunization History  Administered Date(s) Administered  . Pneumococcal Conjugate-13 09/20/2013  . Pneumococcal Polysaccharide-23  12/25/2010  . Td 01/09/2005     PAST HISTORY  Medical History Past Medical History  Diagnosis Date  . Sleep apnea   . ALLERGIC RHINITIS   . Colon polyp, hyperplastic 02/2003, 03/2014  . Chronic rhinosinusitis   . Hyperlipidemia   . Osteoporosis   . GERD (gastroesophageal reflux disease)   . Hiatal hernia   . Helicobacter pylori gastritis 02/2009    partially treated  . Gastroparesis 2010  . Chronic obstructive asthma     PFT 11/06/10 - FEV1 1.24/ 0.62; FEV1/FVC 0.56, TLC 0.78; DLCO 0.75  . Allergic asthma     h/o  . Bronchiectasis     h/o  . Hypertension   . COPD (chronic obstructive pulmonary disease)   . Anxiety     Family History Family History  Problem Relation Age of Onset  . Colon cancer Neg Hx   . Throat cancer Neg Hx   . Pancreatic cancer Neg Hx   . Diabetes Neg Hx   . Heart disease Neg Hx   . Kidney disease Neg Hx   . Liver disease Neg Hx   . Stroke Son 39    ischemic  . Stroke Sister 61  . Hypertension Son     Social History History  Substance Use Topics  . Smoking status: Former Smoker -- 3 years    Types: Cigarettes    Quit date: 11/11/1962  . Smokeless tobacco: Never Used     Comment: socially  . Alcohol Use: No     RISK FACTORS  Tobacco History  Smoking status  . Former Smoker -- 3 years  . Types: Cigarettes  . Quit date: 11/11/1962  Smokeless tobacco  . Never Used    Comment: socially    Current exercise habits: Yoga, pulmonary rehab, [facility] Dietary issues discussed: Good - eats fruits and vegetables.   Cardiac risk factors: advanced age (older than 9 for men, 43 for women) and hypertension.  Depression Screen  Q1: Over the past two weeks, have you felt down, depressed or hopeless? No  Q2: Over the past two weeks, have you felt little interest or pleasure in doing things? No  Have you lost interest or pleasure in daily life? No  Do you often feel hopeless? No  Do you cry easily over simple problems?  No  Activities of Daily Living In your present state of health, do you have any difficulty performing the following activities?:  Driving? No Managing money?  No Feeding yourself? No Getting from bed to chair? No Climbing a flight of stairs? No Preparing food and eating?: No Bathing or showering? No Getting dressed: No Getting to the toilet? No Using the toilet: No Moving around from place to place: No In the past year have you fallen or had a near fall?:No   Home Safety Has smoke detector and wears seat belts. No firearms. No excess sun exposure.  Are there smokers in your home (other than you)?  No  Hearing Difficulties: No Do you often ask people to speak up or repeat themselves? No Do you experience ringing or noises in your ears? No  Do you have difficulty understanding soft or whispered voices? No  Do you feel that you have a problem with memory? No  Do you often misplace items? No  Do you feel safe at home?  Yes  Cognitive Testing  Alert? Yes   Normal Appearance? Yes  Oriented to person? Yes  Place? Yes   Time? Yes  Recall of three objects?  Yes  Can perform simple calculations? Yes  Displays appropriate judgment? Yes  Can read the correct time from a watch face? Yes   Advanced Directives have been discussed with the patient? Yes - working on getting it together.   Current Physicians/Providers and Suppliers 1. [name] - Pulmonology 2. Dr. Randa Evens - Podiatrist 3. Dr. Fuller Plan - Gastrologist 4. Dr. Constance Holster - ENT 5. Dr. Burt Knack - Cardiology  Indicate any recent Medical Services you may have received from other than [facility] providers in the past year (date may be approximate).  All answers were reviewed with the patient and necessary referrals were made:  Mauricio Po, [place]   10/28/2014    Review of Systems Constitutional: Denies fever, chills, fatigue, or significant weight gain/loss. HENT: Head: Denies headache or neck pain Ears: Denies changes in hearing,  ringing in ears, earache, drainage Nose: Denies discharge, stuffiness, itching, nosebleed, sinus pain Throat: Denies sore throat, hoarseness, dry mouth, sores, thrush Eyes: Denies loss/changes in vision, pain, redness, blurry/double vision, flashing lights Cardiovascular: Denies chest pain/discomfort, tightness, palpitations, shortness of breath with activity, difficulty lying down, swelling, sudden awakening with shortness of breath Respiratory: Denies shortness of breath, cough, sputum production, wheezing Gastrointestinal: Denies dysphasia, heartburn, change in appetite, nausea, change in bowel habits, rectal bleeding, constipation, diarrhea, yellow skin or eyes Genitourinary: Denies frequency, urgency, burning/pain, blood in urine, incontinence, change in urinary strength. Musculoskeletal: Denies muscle/joint pain, stiffness, back pain, redness or swelling of joints, trauma Currently working on shoulder discomfort and has been recommended to phyiscal therapy. Skin: Denies rashes, lumps, itching, dryness, color changes, or hair/nail changes Neurological: Denies dizziness, fainting, seizures, weakness, numbness, tingling, tremor Psychiatric - Denies nervousness, stress, depression or memory loss Endocrine: Denies heat or cold intolerance, sweating, frequent urination, excessive thirst, changes in appetite Hematologic: Denies ease of bruising or bleeding   Objective:    BP 160/84 mmHg  Pulse 63  Temp(Src) 97.8 F (36.6 C) (Oral)  Resp 18  Ht 5' 4.5" (1.638 m)  Wt 166 lb 6.4 oz (75.479 kg)  BMI 28.13 kg/m2  SpO2 97%  Physical Exam  Constitutional: She is oriented to person, place, and time. No distress.  HENT:  Right Ear: External ear normal.  Left Ear: External ear normal.  Nose: Nose normal.  Mouth/Throat: Oropharynx is clear and moist.  Eyes: Conjunctivae and EOM are normal. Pupils are equal, round, and reactive to light.  Neck: Normal range of motion. Neck supple. No JVD  present. No tracheal deviation present. No thyromegaly present.  Cardiovascular: Normal rate, regular rhythm, normal heart sounds and intact distal pulses.   Respiratory: Effort normal and breath sounds normal. No respiratory distress. She has no wheezes. She has no rales. She exhibits no tenderness.  GI: Soft. Bowel sounds are normal. She exhibits no distension and no mass. There is no tenderness. There is no rebound and no guarding.  Musculoskeletal: Normal  range of motion.  Lymphadenopathy:    She has no cervical adenopathy.  Neurological: She is alert and oriented to person, place, and time. She has normal reflexes. No cranial nerve deficit. Coordination normal.  Skin: Skin is warm and dry.  Psychiatric: She has a normal mood and affect. Her behavior is normal. Judgment and thought content normal.        Assessment:      See Plan Below      Plan:     During the course of the visit the patient was educated and counseled about appropriate screening and preventive services including:    Nutrition counseling, physical activity and safety.   Diet review for nutrition referral? Yes ____  Not Indicated _X___   Patient Instructions (the written plan) was given to the patient.  Medicare Attestation I have personally reviewed: The patient's medical and social history Their use of alcohol, tobacco or illicit drugs Their current medications and supplements The patient's functional ability including ADLs,fall risks, home safety risks, cognitive, and hearing and visual impairment Diet and physical activities Evidence for depression or mood disorders  The patient's weight, height, BMI,  have been recorded in the chart.  I have made referrals, counseling, and provided education to the patient based on review of the above and I have provided the patient with a written personalized care plan for preventive services.     Mauricio Po, FNP   10/28/2014

## 2014-10-31 ENCOUNTER — Telehealth: Payer: Self-pay | Admitting: Internal Medicine

## 2014-10-31 ENCOUNTER — Other Ambulatory Visit: Payer: Self-pay

## 2014-10-31 MED ORDER — HYDROCODONE-ACETAMINOPHEN 7.5-325 MG PO TABS: 1.0000 | ORAL_TABLET | Freq: Four times a day (QID) | ORAL | Status: DC | PRN

## 2014-10-31 NOTE — Telephone Encounter (Signed)
Pt called request refill for hydrocodone. Please call pt

## 2014-11-01 ENCOUNTER — Encounter (HOSPITAL_COMMUNITY): Payer: Self-pay

## 2014-11-01 NOTE — Telephone Encounter (Signed)
Tried to call pt but no voicemail has been set up.   RE: rx for hydrocodone is ready for pick up.

## 2014-11-03 ENCOUNTER — Encounter (HOSPITAL_COMMUNITY): Payer: Self-pay

## 2014-11-08 ENCOUNTER — Encounter (HOSPITAL_COMMUNITY): Payer: Self-pay

## 2014-11-09 ENCOUNTER — Encounter: Payer: Self-pay | Admitting: Physical Therapy

## 2014-11-09 ENCOUNTER — Telehealth: Payer: Self-pay | Admitting: *Deleted

## 2014-11-09 ENCOUNTER — Ambulatory Visit: Payer: Medicare Other | Attending: Internal Medicine | Admitting: Physical Therapy

## 2014-11-09 DIAGNOSIS — M79602 Pain in left arm: Secondary | ICD-10-CM | POA: Diagnosis not present
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
## 2014-11-09 NOTE — Patient Instructions (Signed)
  Walk Up Exercise (Active/Assistive)   With elbow straight, use fingers to "crawl" up wall or door frame as far as possible. Hold _10___ seconds. Repeat 5-10____ times. Do __2_ sessions per day.  Copyright  [employer]. All rights reserved.  Slide hand up and down wall, then step out to the side and slide hand out and up. Apply ice daily. Maintain elbow Range of motion.

## 2014-11-09 NOTE — Telephone Encounter (Signed)
appts made and printed...td 

## 2014-11-10 ENCOUNTER — Encounter (HOSPITAL_COMMUNITY)
Admission: RE | Admit: 2014-11-10 | Discharge: 2014-11-10 | Disposition: A | Discharge status: Home / Self Care | Payer: Self-pay | Source: Ambulatory Visit | Attending: Pulmonary Disease | Admitting: Pulmonary Disease

## 2014-11-14 ENCOUNTER — Ambulatory Visit (INDEPENDENT_AMBULATORY_CARE_PROVIDER_SITE_OTHER): Payer: Medicare Other

## 2014-11-14 DIAGNOSIS — J4520 Mild intermittent asthma, uncomplicated: Secondary | ICD-10-CM

## 2014-11-15 ENCOUNTER — Encounter (HOSPITAL_COMMUNITY)
Admission: RE | Admit: 2014-11-15 | Discharge: 2014-11-15 | Disposition: A | Discharge status: Home / Self Care | Payer: Self-pay | Source: Ambulatory Visit | Attending: Pulmonary Disease | Admitting: Pulmonary Disease

## 2014-11-15 DIAGNOSIS — M810 Age-related osteoporosis without current pathological fracture: Secondary | ICD-10-CM | POA: Insufficient documentation

## 2014-11-15 DIAGNOSIS — J449 Chronic obstructive pulmonary disease, unspecified: Secondary | ICD-10-CM | POA: Insufficient documentation

## 2014-11-15 DIAGNOSIS — J45909 Unspecified asthma, uncomplicated: Secondary | ICD-10-CM | POA: Insufficient documentation

## 2014-11-15 DIAGNOSIS — G4730 Sleep apnea, unspecified: Secondary | ICD-10-CM | POA: Insufficient documentation

## 2014-11-15 DIAGNOSIS — K219 Gastro-esophageal reflux disease without esophagitis: Secondary | ICD-10-CM | POA: Insufficient documentation

## 2014-11-15 DIAGNOSIS — Z5189 Encounter for other specified aftercare: Secondary | ICD-10-CM | POA: Insufficient documentation

## 2014-11-16 ENCOUNTER — Encounter: Payer: Medicare Other | Admitting: Physical Therapy

## 2014-11-16 MED ORDER — OMALIZUMAB 150 MG ~~LOC~~ SOLR
300.0000 mg | Freq: Once | SUBCUTANEOUS | Status: AC
  Administered 2014-11-14: 300 mg via SUBCUTANEOUS

## 2014-11-21 ENCOUNTER — Ambulatory Visit (INDEPENDENT_AMBULATORY_CARE_PROVIDER_SITE_OTHER): Payer: Medicare Other | Admitting: Internal Medicine

## 2014-11-21 ENCOUNTER — Encounter: Payer: Self-pay | Admitting: Internal Medicine

## 2014-11-21 ENCOUNTER — Telehealth: Payer: Self-pay | Admitting: *Deleted

## 2014-11-21 ENCOUNTER — Other Ambulatory Visit (INDEPENDENT_AMBULATORY_CARE_PROVIDER_SITE_OTHER): Payer: Medicare Other

## 2014-11-21 VITALS — BP 164/80 | HR 72 | Ht 64.0 in | Wt 166.0 lb

## 2014-11-21 DIAGNOSIS — I10 Essential (primary) hypertension: Secondary | ICD-10-CM | POA: Diagnosis not present

## 2014-11-21 DIAGNOSIS — J4550 Severe persistent asthma, uncomplicated: Secondary | ICD-10-CM | POA: Diagnosis not present

## 2014-11-21 LAB — CBC
HCT: 38 % (ref 36.0–46.0)
Hemoglobin: 12.3 g/dL (ref 12.0–15.0)
MCHC: 32.4 g/dL (ref 30.0–36.0)
MCV: 86.9 fl (ref 78.0–100.0)
Platelets: 224 10*3/uL (ref 150.0–400.0)
RBC: 4.38 Mil/uL (ref 3.87–5.11)
RDW: 13.9 % (ref 11.5–15.5)
WBC: 8.6 10*3/uL (ref 4.0–10.5)

## 2014-11-21 NOTE — Progress Notes (Signed)
Subjective:    Patient ID: [name] [name], female    DOB: February 02, 1941, 74 y.o.   MRN: [id number]  HPI  1. ASTHMA - Baseline Chronic High Risk (multiple admits), Allergic, Moderate-Severe Persistent Asthma  - Allergies, gerd, sinus exacerbators.  - Frequent prednisone -> daily low dose prednisone since FEb 20112  - PFT 11/06/2010 - Mixed obstruction - restriction - fev1 1L/50%, Ratio 54, 24% BD response, TLC 3.6/78%, DLCO 16/75%  - May 2012: Switched asthma care to [name]  - June 2012 PFT - showed normalization with prednisone: PFts 04/18/2011 shows NORMALIZATION (fev1 1.8L/93%, ratio 68, 9% BD response, DLCO 86).  - August 2012: Fev1: 1.1lL/58% -> prednisone burst-> 1.09L/60%, Ratio 65 (no  - Nov 2012: Fev1 0.97L/51%, Ratio 60 -> pred burst  - Dec 2012: Fev1 0.7L/37%, Ratio 67%  - August 2014: FEV1  1.17 L/62%  2. Bronchiectasis, (mild RUL) seen on CT chest - April 2012 ( ABPA wu tests on prednisone in June 2012: asp antibody IgE asp fumigatus 0.24 ku/L which is borderline/equivocal, IgG for all all aspergiluus preciptins - NEGATIVE. 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 Allergic rhinitis and allergic asthma.  - 2000s: - per hx strongly skin test positive at Dr Neldon Mc office  - 2004 - trialed xolair - per hx effective but developed rash which she subjectively attributed to twice daily dosing (thogh chart review 2004-2005 states it was stopped due to subjective lack of improvement and an incidental rash resolution was noted in retrospect after stopping xolair)  - 2009: IgE 306 in July 2009. RAST positive for various grass, oak, elm, ragweed, plantain, lamb etc., s/p Allergy shot trial with Dr Annamaria Boots - stopped due to transport issues  - 2012: May - IgE 588.  - 2012: June - moved allergy MD to Dr Hardie Pulley  - 2013: Restart Xolair    4. Lung nodule, 6 mm, on CT scan. - April 2012. Possibly calcified. RUL (no prior for  comparison but a 07/17/2000 CT chest reports simillar size nodule in similar location). Repeat CT needed April 2013   5. Gastroesophageal reflux disease with hx of UGI scope showing Hiatal hernia (small)  - delayed gastric empyting 2010 March  6. History of sleep apnea.- non compliant with CPAP   7. Significant anxiety (also mood lability hx with prednisone, worsening anxiety with albuterol)    8. Recurrent AE-ASthma  - Admitted 4/23-4/30/12  - OPD AE asthma Rx: July 2012, august 2012, Nov 2012  - Office treatment with Levaquin and prednisone-November 2013 -  Levaquin and prednisone -January 2014   #Obesity  - 192# - nov 2013. STart low glycemic diet  - 178#  - 03/10/2013. Body mass index is 31.03 kg/(m^2). - 175# - 04/08/13 - 168# - 05/12/2013 with Body mass index is 28.93 kg/(m^2). - 167# on 07/07/2013 with Body mass index is 28.65 kg/(m^2). -  154# on 10/18/2013 wuth Body mass index is 26.42 kg/(m^2).  - Body mass index is 27.79 kg/(m^2). on 07/22/2014    OV 11/21/2014 Chief Complaint  Patient presents with  . Follow-up    Pt stated her breathing is unchanged. Pt c/o DOE, prod cough clear mucus. Pt denies CP/tightness.   Follow-up severe persistent asthma. With question of noncompliance which she denies  At baseline she is on symbicort, xolair, singulair, daily prednisone 2-5mg  a sinus therapy /. Last visit 07/22/2014 I tried Spiriva but she tells  me that this did not help her. She tells me that overall asthma stable although in the last few days she might be a little bit more short of breath. She skin on having a spirometry today. Main issues that she now feels a subjective desire to come off Xolair at least by half dose because of shoulder problems and headaches. She is also keen on gradually reducing and stopping her daily prednisone. Even though she is severe persistent asthma she says she wants to give this a try because she is frustrated by daily prednisone  Spirometry in the  office today: shows improvedment fev1 1.22L/66%. FVC 2.4L/72%, ratio 70   Past, Family, Social reviewed: no change since last visit other than her friend with IPF Lilli Few died and she is upset.    Review of Systems  Constitutional: Negative for fever and unexpected weight change.  HENT: Negative for congestion, dental problem, ear pain, nosebleeds, postnasal drip, rhinorrhea, sinus pressure, sneezing, sore throat and trouble swallowing.   Eyes: Negative for redness and itching.  Respiratory: Positive for cough and shortness of breath. Negative for chest tightness and wheezing.   Cardiovascular: Negative for palpitations and leg swelling.  Gastrointestinal: Negative for nausea and vomiting.  Genitourinary: Negative for dysuria.  Musculoskeletal: Negative for joint swelling.  Skin: Negative for rash.  Neurological: Negative for headaches.  Hematological: Does not bruise/bleed easily.  Psychiatric/Behavioral: Negative for dysphoric mood. The patient is not nervous/anxious.    Current outpatient prescriptions: ALPRAZolam (XANAX) 0.25 MG tablet, Take 1 tablet (0.25 mg total) by mouth 2 (two) times daily. (Patient taking differently: Take 0.25 mg by mouth 2 (two) times daily as needed. ), Disp: 60 tablet, Rfl: 3;  amLODipine (NORVASC) 5 MG tablet, Take 1 tablet (5 mg total) by mouth every morning., Disp: 90 tablet, Rfl: 3;  aspirin EC 81 MG tablet, Take 1 tablet (81 mg total) by mouth daily., Disp: 150 tablet, Rfl: 2 budesonide-formoterol (SYMBICORT) 160-4.5 MCG/ACT inhaler, Inhale 2 puffs into the lungs 2 (two) times daily., Disp: 3 Inhaler, Rfl: 3;  cycloSPORINE (RESTASIS) 0.05 % ophthalmic emulsion, Place 1 drop into both eyes 2 (two) times daily., Disp: 0.4 mL, Rfl: 0;  desloratadine (CLARINEX) 5 MG tablet, Take 1 tablet (5 mg total) by mouth daily., Disp: 90 tablet, Rfl: 3 EPINEPHrine (EPIPEN IJ), Inject as directed as needed (allergic reaction)., Disp: , Rfl: ;  esomeprazole (NEXIUM) 40 MG  capsule, Take 1 capsule (40 mg total) by mouth daily at 12 noon., Disp: 90 capsule, Rfl: 3;  hydrochlorothiazide (HYDRODIURIL) 25 MG tablet, Take 1 tablet (25 mg total) by mouth every morning. (Patient taking differently: Take 25 mg by mouth every other day. ), Disp: 90 tablet, Rfl: 3 HYDROcodone-acetaminophen (NORCO) 7.5-325 MG per tablet, Take 1 tablet by mouth every 6 (six) hours as needed for moderate pain., Disp: 90 tablet, Rfl: 0;  levalbuterol (XOPENEX HFA) 45 MCG/ACT inhaler, Inhale 1 puff into the lungs every 4 (four) hours as needed for wheezing., Disp: , Rfl:  levalbuterol (XOPENEX) 0.63 MG/3ML nebulizer solution, Take 0.63 mg by nebulization every 4 (four) hours as needed for wheezing or shortness of breath., Disp: , Rfl: ;  montelukast (SINGULAIR) 10 MG tablet, Take 1 tablet (10 mg total) by mouth at bedtime., Disp: 90 tablet, Rfl: 3;  olopatadine (PATANOL) 0.1 % ophthalmic solution, Place 1 drop into both eyes 2 (two) times daily., Disp: 5 mL, Rfl: 3 omalizumab (XOLAIR) 150 MG injection, Inject 300 mg into the skin every 28 (twenty-eight) days.,  Disp: , Rfl: ;  predniSONE (DELTASONE) 1 MG tablet, CAN TAKE 2-5 TABLETS DAILY AS NEEDED, Disp: 100 tablet, Rfl: 3;  predniSONE (DELTASONE) 2.5 MG tablet, Take 2.5 mg by mouth daily with breakfast. Takes this most days but if breathing is bad she takes 5 mg, Disp: , Rfl:  Vitamin D, Ergocalciferol, (DRISDOL) 50000 UNITS CAPS capsule, TAKE 1 CAPSULE (50,000 UNITS TOTAL) BY MOUTH EVERY 7 (SEVEN) DAYS., Disp: 12 capsule, Rfl: 1     Objective:   Physical Exam  Filed Vitals:   11/21/14 1537  BP: 164/80  Pulse: 72  Height: 5\' 4"  (1.626 m)  Weight: 166 lb (75.297 kg)  SpO2: 99%     Constitutional: She is oriented to person, place, and time. She appears well-developed and well-nourished. No distress.  HENT:  Head: Normocephalic and atraumatic.  Right Ear: External ear normal.  Left Ear: External ear normal.  Mouth/Throat: Oropharynx is clear  and moist. No oropharyngeal exudate.  Eyes: Conjunctivae and EOM are normal. Pupils are equal, round, and reactive to light. Right eye exhibits no discharge. Left eye exhibits no discharge. No scleral icterus.  Neck: Normal range of motion. Neck supple. No JVD present. No tracheal deviation present. No thyromegaly present.  Cardiovascular: Normal rate, regular rhythm, normal heart sounds and intact distal pulses.  Exam reveals no gallop and no friction rub.   No murmur heard. Pulmonary/Chest: Effort normal. No respiratory distress. She has  Mild very occ wheezes. (last visit - >no wheeze) she has  no rales. She exhibits no tenderness.  Abdominal: Soft. Bowel sounds are normal. She exhibits no distension and no mass. There is no tenderness. There is no rebound and no guarding.  Musculoskeletal: Normal range of motion. She exhibits no edema and no tenderness.  Lymphadenopathy:    She has no cervical adenopathy.  Neurological: She is alert and oriented to person, place, and time. She has normal reflexes. No cranial nerve deficit. She exhibits normal muscle tone. Coordination normal.  Skin: Skin is warm and dry. No rash noted. She is not diaphoretic. No erythema. No pallor.  Psychiatric: She has a normal mood and affect. Her behavior is normal. Judgment and thought content normal.          Assessment & Plan:     ICD-9-CM ICD-10-CM   1. Asthma, severe persistent, uncomplicated 466.59 D35.70 Spirometry with Graph   Asthma appears stable  To improved Continue current medications; symbicort,and  Singulair, At your request let us try to wean you off prednisone  - take  daily prednisone 2mg  for 2 weeks  - then, take prednisone daily 1mg  for 2 weeks  - then take prednisone 1mg  on Monday, WEd, Friday for 2 weeks  - then sTOP  Cut Xolair dose by half at your request Keep sinus under control through Dr Constance Holster and saline spray Keep reflux under control with nexium and low glycemic diet IF asthma  flares up, call us for prednisone burst or come in   Followup IF worse, call us Followup 4-6 weeks months or sooner Spirometry in office at followup (not PFT by June Leap)    Dr. Brand Males, M.D., [facility].C.P Pulmonary and Critical Care Medicine Staff Physician [place] [facility] Pager: [phone], If no answer or between  15:00h - 7:00h: call [phone]  11/21/2014 4:09 PM

## 2014-11-21 NOTE — Telephone Encounter (Signed)
pt cancel her appts due to her bill of 700.00. she stated once she get it straight then she will return...td

## 2014-11-21 NOTE — Patient Instructions (Addendum)
Asthma appears stable  To improved Continue current medications; symbicort,and  Singulair, At your request let us try to wean you off prednisone  - take  daily prednisone 2mg  for 2 weeks  - then, take prednisone daily 1mg  for 2 weeks  - then take prednisone 1mg  on Monday, WEd, Friday for 2 weeks  - then sTOP  Cut Xolair dose by half at your request Keep sinus under control through Dr Constance Holster and saline spray Keep reflux under control with nexium and low glycemic diet IF asthma flares up, call us for prednisone burst or come in   Followup IF worse, call us Followup 4-6 weeks months or sooner Spirometry in office at followup (not PFT by June Leap)

## 2014-11-22 ENCOUNTER — Telehealth: Payer: Self-pay | Admitting: Family

## 2014-11-22 ENCOUNTER — Encounter (HOSPITAL_COMMUNITY)
Admission: RE | Admit: 2014-11-22 | Discharge: 2014-11-22 | Disposition: A | Discharge status: Home / Self Care | Payer: Self-pay | Source: Ambulatory Visit | Attending: Pulmonary Disease | Admitting: Pulmonary Disease

## 2014-11-22 DIAGNOSIS — N182 Chronic kidney disease, stage 2 (mild): Secondary | ICD-10-CM

## 2014-11-22 LAB — BASIC METABOLIC PANEL
BUN: 16 mg/dL (ref 6–23)
CO2: 23 meq/L (ref 19–32)
Calcium: 9.3 mg/dL (ref 8.4–10.5)
Chloride: 108 mEq/L (ref 96–112)
Creatinine, Ser: 1.3 mg/dL — ABNORMAL HIGH (ref 0.4–1.2)
GFR: 52.28 mL/min — ABNORMAL LOW (ref >60.00)
GLUCOSE: 114 mg/dL — AB (ref 70–99)
Potassium: 3.9 mEq/L (ref 3.5–5.1)
SODIUM: 139 meq/L (ref 135–145)

## 2014-11-22 LAB — LIPID PANEL
Cholesterol: 210 mg/dL — ABNORMAL HIGH (ref 0–200)
HDL: 46.1 mg/dL (ref >39.00)
NonHDL: 163.9
TRIGLYCERIDES: 209 mg/dL — AB (ref 0.0–149.0)
Total CHOL/HDL Ratio: 5
VLDL: 41.8 mg/dL — AB (ref 0.0–40.0)

## 2014-11-22 LAB — LDL CHOLESTEROL, DIRECT: Direct LDL: 123.2 mg/dL

## 2014-11-22 NOTE — Telephone Encounter (Signed)
Please inform patient that her white/red blood cells look good. Her fasting blood glucose is elevated indicating that she is in the prediabetic range. Her cholesterol is slightly elevated, however nothing that requires medication at this point. The other area is her kidney function has slightly worsened since her previous lab work. Her current levels place her in chronic kidney disease Stage III which is a moderate reduction of kidney function. There is no specific treatment for this, however the goals would be to control risk factors including maintaining a good blood pressure and at this point making sure your fasting glucose does not go higher. This can be done by increasing the nutrient density in her diet through increasing vegetable, fruit and fiber intake while reducing saturated fat and processed food intake. Continue to exercise should help as well.  Please plan to follow up your blood pressure in about 3 months.

## 2014-11-23 ENCOUNTER — Ambulatory Visit: Payer: Medicare Other | Admitting: Physical Therapy

## 2014-11-23 NOTE — Telephone Encounter (Signed)
Called pt to let her know results. She stated that she was not fasting during her blood work that she had just ate before she got here. She would like her blood work rechecked. Says that she is eating better and exercising regularly.

## 2014-11-23 NOTE — Telephone Encounter (Signed)
Contacted pt to confirmed if she did want the med supplies listed on the [facility]. Pt does.   While pt had me on the phone, she was concerned with the results, more specifically with GFR and creatinine. She was very worried about the CKD stage 3. I explained what the GFR and creatinine read and tell about the kidneys. Pt stated that the labs were not fasting and wanted to redo them. Labs have been entered. Pt also wanted appt to see PCP. Appt has been made.   We talked a little bit more about past lab results and the similarities between them and Mondays results. Pt was informed to keep doing what she is doing, bringing plenty of water, proper diet and adequate exercise to preserve the current functions that her kidneys do have.   The is being sent to [facility] Sickles, and Dr. Asa Lente for review and inform that phone note is taken care of.

## 2014-11-23 NOTE — Telephone Encounter (Signed)
Patient returning call. Advised that [name] [name] is with a patient at the moment and that this is the best # to get her at.

## 2014-11-24 ENCOUNTER — Encounter (HOSPITAL_COMMUNITY)
Admission: RE | Admit: 2014-11-24 | Discharge: 2014-11-24 | Disposition: A | Discharge status: Home / Self Care | Payer: Self-pay | Source: Ambulatory Visit | Attending: Pulmonary Disease | Admitting: Pulmonary Disease

## 2014-11-28 ENCOUNTER — Encounter: Payer: Medicare Other | Admitting: Rehabilitation

## 2014-11-29 ENCOUNTER — Encounter (HOSPITAL_COMMUNITY): Payer: Self-pay

## 2014-11-30 ENCOUNTER — Encounter: Payer: Medicare Other | Admitting: Physical Therapy

## 2014-11-30 ENCOUNTER — Other Ambulatory Visit: Payer: Self-pay

## 2014-11-30 ENCOUNTER — Telehealth: Payer: Self-pay | Admitting: Internal Medicine

## 2014-11-30 MED ORDER — HYDROCODONE-ACETAMINOPHEN 7.5-325 MG PO TABS: 1.0000 | ORAL_TABLET | Freq: Four times a day (QID) | ORAL | Status: DC | PRN

## 2014-11-30 NOTE — Telephone Encounter (Signed)
Pt wants to pick up  hydrocodone refill.  [phone]

## 2014-12-01 ENCOUNTER — Encounter (HOSPITAL_COMMUNITY): Payer: Self-pay

## 2014-12-01 NOTE — Telephone Encounter (Signed)
rx printed and sign.   VM left for pt to come and pick up.

## 2014-12-06 ENCOUNTER — Encounter (HOSPITAL_COMMUNITY): Payer: Self-pay

## 2014-12-08 ENCOUNTER — Encounter (HOSPITAL_COMMUNITY)
Admission: RE | Admit: 2014-12-08 | Discharge: 2014-12-08 | Disposition: A | Discharge status: Home / Self Care | Payer: Self-pay | Source: Ambulatory Visit | Attending: Pulmonary Disease | Admitting: Pulmonary Disease

## 2014-12-12 ENCOUNTER — Ambulatory Visit (INDEPENDENT_AMBULATORY_CARE_PROVIDER_SITE_OTHER): Payer: Medicare Other

## 2014-12-12 ENCOUNTER — Other Ambulatory Visit (INDEPENDENT_AMBULATORY_CARE_PROVIDER_SITE_OTHER): Payer: Medicare Other

## 2014-12-12 DIAGNOSIS — N182 Chronic kidney disease, stage 2 (mild): Secondary | ICD-10-CM

## 2014-12-12 DIAGNOSIS — J4550 Severe persistent asthma, uncomplicated: Secondary | ICD-10-CM | POA: Diagnosis not present

## 2014-12-12 LAB — BASIC METABOLIC PANEL
BUN: 20 mg/dL (ref 6–23)
CO2: 24 mEq/L (ref 19–32)
Calcium: 9.4 mg/dL (ref 8.4–10.5)
Chloride: 107 mEq/L (ref 96–112)
Creatinine, Ser: 1.13 mg/dL (ref 0.40–1.20)
GFR: 60.9 mL/min (ref >60.00)
GLUCOSE: 98 mg/dL (ref 70–99)
Potassium: 3.9 mEq/L (ref 3.5–5.1)
SODIUM: 137 meq/L (ref 135–145)

## 2014-12-13 ENCOUNTER — Encounter (HOSPITAL_COMMUNITY)
Admission: RE | Admit: 2014-12-13 | Discharge: 2014-12-13 | Disposition: A | Discharge status: Home / Self Care | Payer: Medicare Other | Source: Ambulatory Visit | Attending: Pulmonary Disease | Admitting: Pulmonary Disease

## 2014-12-13 DIAGNOSIS — G4730 Sleep apnea, unspecified: Secondary | ICD-10-CM | POA: Diagnosis not present

## 2014-12-13 DIAGNOSIS — J449 Chronic obstructive pulmonary disease, unspecified: Secondary | ICD-10-CM | POA: Diagnosis not present

## 2014-12-13 DIAGNOSIS — M810 Age-related osteoporosis without current pathological fracture: Secondary | ICD-10-CM | POA: Diagnosis not present

## 2014-12-13 DIAGNOSIS — J45909 Unspecified asthma, uncomplicated: Secondary | ICD-10-CM | POA: Diagnosis not present

## 2014-12-13 DIAGNOSIS — K219 Gastro-esophageal reflux disease without esophagitis: Secondary | ICD-10-CM | POA: Diagnosis not present

## 2014-12-13 DIAGNOSIS — Z5189 Encounter for other specified aftercare: Secondary | ICD-10-CM | POA: Diagnosis not present

## 2014-12-15 ENCOUNTER — Encounter (HOSPITAL_COMMUNITY)
Admission: RE | Admit: 2014-12-15 | Discharge: 2014-12-15 | Disposition: A | Discharge status: Home / Self Care | Payer: Medicare Other | Source: Ambulatory Visit | Attending: Pulmonary Disease | Admitting: Pulmonary Disease

## 2014-12-15 ENCOUNTER — Other Ambulatory Visit: Payer: Self-pay | Admitting: Internal Medicine

## 2014-12-15 MED ORDER — OMALIZUMAB 150 MG ~~LOC~~ SOLR
150.0000 mg | Freq: Once | SUBCUTANEOUS | Status: AC
  Administered 2014-12-12: 150 mg via SUBCUTANEOUS

## 2014-12-20 ENCOUNTER — Encounter (HOSPITAL_COMMUNITY)
Admission: RE | Admit: 2014-12-20 | Discharge: 2014-12-20 | Disposition: A | Discharge status: Home / Self Care | Payer: Medicare Other | Source: Ambulatory Visit | Attending: Pulmonary Disease | Admitting: Pulmonary Disease

## 2014-12-20 ENCOUNTER — Ambulatory Visit: Payer: Medicare Other | Admitting: Internal Medicine

## 2014-12-22 ENCOUNTER — Encounter (HOSPITAL_COMMUNITY)
Admission: RE | Admit: 2014-12-22 | Discharge: 2014-12-22 | Disposition: A | Discharge status: Home / Self Care | Payer: Medicare Other | Source: Ambulatory Visit | Attending: Pulmonary Disease | Admitting: Pulmonary Disease

## 2014-12-22 ENCOUNTER — Ambulatory Visit (INDEPENDENT_AMBULATORY_CARE_PROVIDER_SITE_OTHER): Payer: Medicare Other | Admitting: Internal Medicine

## 2014-12-22 ENCOUNTER — Encounter: Payer: Self-pay | Admitting: Internal Medicine

## 2014-12-22 VITALS — BP 132/76 | HR 63 | Temp 98.1°F | Wt 167.0 lb

## 2014-12-22 DIAGNOSIS — N189 Chronic kidney disease, unspecified: Secondary | ICD-10-CM

## 2014-12-22 DIAGNOSIS — N182 Chronic kidney disease, stage 2 (mild): Secondary | ICD-10-CM

## 2014-12-22 NOTE — Patient Instructions (Signed)
We will have you see Dr. Asa Lente back as scheduled.   If you have any problems or questions please feel free to call our office.

## 2014-12-23 NOTE — Assessment & Plan Note (Signed)
Spoke with her and counseled her about the slight insufficiency she has had over the years. Her most recent test was back to her recent baseline. We talked about changes in hydration and medication as causes of slight variations. Talked about good control of her blood pressure as the best way to keep her kidneys healthy over the years. Answered any questions that she had.

## 2014-12-23 NOTE — Progress Notes (Signed)
   Subjective:    Patient ID: Barbee Shropshire, female    DOB: [birth year], 74 y.o.   MRN: [id number]  HPI The patient is a 74 YO female who is coming in to talk about lab results from last visit. She was told over the phone that her kidney function was not as good and this scared her. She did not change anything and has not noticed new symptoms. Then she repeated the test and it came back normal. We discussed both results.   Review of Systems  Constitutional: Negative.   HENT: Negative.   Respiratory: Negative.   Cardiovascular: Negative.   Gastrointestinal: Negative.   Musculoskeletal: Negative.   Neurological: Negative.   Psychiatric/Behavioral: Negative.       Objective:   Physical Exam  Constitutional: She is oriented to person, place, and time. She appears well-developed and well-nourished.  HENT:  Head: Normocephalic and atraumatic.  Eyes: EOM are normal.  Neck: Normal range of motion.  Cardiovascular: Normal rate and regular rhythm.   Pulmonary/Chest: Effort normal and breath sounds normal.  Abdominal: Soft.  Neurological: She is alert and oriented to person, place, and time.  Skin: Skin is warm and dry.   Filed Vitals:   12/22/14 1634 12/22/14 1710  BP: 172/90 132/76  Pulse: 63   Temp: 98.1 F (36.7 C)   TempSrc: Oral   Weight: 167 lb (75.751 kg)   SpO2: 98%       Assessment & Plan:  Spent 25 minutes with the patent, greater than 50% of which was spent in face to face counseling.

## 2014-12-27 ENCOUNTER — Encounter (HOSPITAL_COMMUNITY)
Admission: RE | Admit: 2014-12-27 | Discharge: 2014-12-27 | Disposition: A | Discharge status: Home / Self Care | Payer: Medicare Other | Source: Ambulatory Visit | Attending: Pulmonary Disease | Admitting: Pulmonary Disease

## 2014-12-29 ENCOUNTER — Encounter (HOSPITAL_COMMUNITY)
Admission: RE | Admit: 2014-12-29 | Discharge: 2014-12-29 | Disposition: A | Discharge status: Home / Self Care | Payer: Medicare Other | Source: Ambulatory Visit | Attending: Pulmonary Disease | Admitting: Pulmonary Disease

## 2015-01-02 ENCOUNTER — Ambulatory Visit (INDEPENDENT_AMBULATORY_CARE_PROVIDER_SITE_OTHER): Payer: Medicare Other | Admitting: Internal Medicine

## 2015-01-02 ENCOUNTER — Encounter: Payer: Self-pay | Admitting: Internal Medicine

## 2015-01-02 ENCOUNTER — Telehealth: Payer: Self-pay | Admitting: Internal Medicine

## 2015-01-02 VITALS — BP 132/70 | HR 62 | Ht 64.0 in | Wt 165.0 lb

## 2015-01-02 DIAGNOSIS — J4550 Severe persistent asthma, uncomplicated: Secondary | ICD-10-CM

## 2015-01-02 DIAGNOSIS — R0600 Dyspnea, unspecified: Secondary | ICD-10-CM | POA: Insufficient documentation

## 2015-01-02 DIAGNOSIS — R918 Other nonspecific abnormal finding of lung field: Secondary | ICD-10-CM | POA: Insufficient documentation

## 2015-01-02 MED ORDER — HYDROCODONE-ACETAMINOPHEN 7.5-325 MG PO TABS: 1.0000 | ORAL_TABLET | Freq: Four times a day (QID) | ORAL | Status: DC | PRN

## 2015-01-02 NOTE — Patient Instructions (Addendum)
#  ASthma severe persistent Asthma appears improved even with prednisone taper Continue current medications; symbicort,and  Singulair, Looks like prednisone taper going well  -  Currentl you are on prednisone 1mg  on Monday, WEd, Friday for 2 weeks  - then sTOP  Continue  Xolair dose by half at your request Keep sinus under control through Dr Constance Holster and saline spray Keep reflux under control with nexium and low glycemic diet IF asthma flares up, call us for prednisone burst or come in   #Lung nodules  - do fu CT chest wo contrast   $ Followup IF worse, call us Followup 4-6 weeks or sooner Spirometry in office at followup (not PFT by June Leap)

## 2015-01-02 NOTE — Telephone Encounter (Signed)
Patient called requesting rx for hydrocodone. Call [phone] when ready for pick up.

## 2015-01-02 NOTE — Progress Notes (Signed)
Subjective:    Patient ID: [name] [name], female    DOB: 03/24/1941, 74 y.o.   MRN: [id number]  HPI   1. ASTHMA - Baseline Chronic High Risk (multiple admits), Allergic, Moderate-Severe Persistent Asthma  - Allergies, gerd, sinus exacerbators.  - Frequent prednisone -> daily low dose prednisone since FEb 20112  - PFT 11/06/2010 - Mixed obstruction - restriction - fev1 1L/50%, Ratio 54, 24% BD response, TLC 3.6/78%, DLCO 16/75%  - May 2012: Switched asthma care to [name]  - June 2012 PFT - showed normalization with prednisone: PFts 04/18/2011 shows NORMALIZATION (fev1 1.8L/93%, ratio 68, 9% BD response, DLCO 86).  - August 2012: Fev1: 1.1lL/58% -> prednisone burst-> 1.09L/60%, Ratio 65 (no  - Nov 2012: Fev1 0.97L/51%, Ratio 60 -> pred burst  - Dec 2012: Fev1 0.7L/37%, Ratio 67%  - August 2014: FEV1  1.17 L/62% - February 2016: FEV1 1.34/72%, ratio 71  2. Bronchiectasis, (mild RUL) seen on CT chest - April 2012 ( ABPA wu tests on prednisone in June 2012: asp antibody IgE asp fumigatus 0.24 ku/L which is borderline/equivocal, IgG for all all aspergiluus preciptins - NEGATIVE. 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 Allergic rhinitis and allergic asthma.  - 2000s: - per hx strongly skin test positive at Dr Neldon Mc office  - 2004 - trialed xolair - per hx effective but developed rash which she subjectively attributed to twice daily dosing (thogh chart review 2004-2005 states it was stopped due to subjective lack of improvement and an incidental rash resolution was noted in retrospect after stopping xolair)  - 2009: IgE 306 in July 2009. RAST positive for various grass, oak, elm, ragweed, plantain, lamb etc., s/p Allergy shot trial with Dr Annamaria Boots - stopped due to transport issues  - 2012: May - IgE 588.  - 2012: June - moved allergy MD to Dr Hardie Pulley  - 2013: Restart Xolair    4. Lung nodule, 6 mm, on CT scan. - April  2012. Possibly calcified. RUL (no prior for comparison but a 07/17/2000 CT chest reports simillar size nodule in similar location). Repeat CT needed April 2013   5. Gastroesophageal reflux disease with hx of UGI scope showing Hiatal hernia (small)  - delayed gastric empyting 2010 March  6. History of sleep apnea.- non compliant with CPAP   7. Significant anxiety (also mood lability hx with prednisone, worsening anxiety with albuterol)    8. Recurrent AE-ASthma  - Admitted 4/23-4/30/12  - OPD AE asthma Rx: July 2012, august 2012, Nov 2012  - Office treatment with Levaquin and prednisone-November 2013 -  Levaquin and prednisone -January 2014   #Obesity  - 192# - nov 2013. STart low glycemic diet  - 178#  - 03/10/2013. Body mass index is 31.03 kg/(m^2). - 175# - 04/08/13 - 168# - 05/12/2013 with Body mass index is 28.93 kg/(m^2). - 167# on 07/07/2013 with Body mass index is 28.65 kg/(m^2). -  154# on 10/18/2013 wuth Body mass index is 26.42 kg/(m^2).  - Body mass index is 27.79 kg/(m^2). on 07/22/2014    OV 11/21/2014 Chief Complaint  Patient presents with  . Follow-up    Pt stated her breathing is unchanged. Pt c/o DOE, prod cough clear mucus. Pt denies CP/tightness.   Follow-up severe persistent asthma. With question of noncompliance which she denies  At baseline she is on symbicort, xolair, singulair, daily prednisone 2-5mg  a sinus therapy /. Last  visit 07/22/2014 I tried Spiriva but she tells me that this did not help her. She tells me that overall asthma stable although in the last [name] days she might be a little bit more short of breath. She skin on having a spirometry today. Main issues that she now feels a subjective desire to come off Xolair at least by half dose because of shoulder problems and headaches. She is also keen on gradually reducing and stopping her daily prednisone. Even though she is severe persistent asthma she says she wants to give this a try because she is frustrated  by daily prednisone  Spirometry in the office today: shows improvedment fev1 1.22L/66%. FVC 2.4L/72%, ratio 70   Past, Family, Social reviewed: no change since last visit other than her friend with IPF [name] [name] died and she is upset.     OV 2015-01-26   Chief Complaint  Patient presents with  . Follow-up    Pt stated after weaning off her pred she has not noticed a difference. Pt state she is also only doing one  Xolair injection instead of two. Pt c/o mild cough with little mucus production and mild fatigue. Pt denies SOB, CP/tightness.     Follow-up severe persistent asthma with questionable compliance which she denies  At last visit she expresses a keen interest to coming off prednisone. Currently she is coming down on prednisone. She is on a taper which she is taking only 1 mg 3 times a week. In the next 2 weeks she's noticed stop prednisone altogether. She continues with Symbicort and Singulair and she is on Xolair injections at a lower dose. She says she is doing well overall. There are no big issues.  Of note in 2013 she had some subcentimeter very small pulmonary nodules. She is asking for surveillance CT scan for the same   Current outpatient prescriptions:  .  ALPRAZolam (XANAX) 0.25 MG tablet, Take 1 tablet (0.25 mg total) by mouth 2 (two) times daily. (Patient not taking: Reported on 12/22/2014), Disp: 60 tablet, Rfl: 3 .  amLODipine (NORVASC) 5 MG tablet, Take 1 tablet (5 mg total) by mouth every morning., Disp: 90 tablet, Rfl: 3 .  aspirin EC 81 MG tablet, Take 1 tablet (81 mg total) by mouth daily., Disp: 150 tablet, Rfl: 2 .  budesonide-formoterol (SYMBICORT) 160-4.5 MCG/ACT inhaler, Inhale 2 puffs into the lungs 2 (two) times daily., Disp: 3 Inhaler, Rfl: 3 .  cycloSPORINE (RESTASIS) 0.05 % ophthalmic emulsion, Place 1 drop into both eyes 2 (two) times daily., Disp: 0.4 mL, Rfl: 0 .  desloratadine (CLARINEX) 5 MG tablet, Take 1 tablet (5 mg total) by mouth daily., Disp:  90 tablet, Rfl: 3 .  EPINEPHrine (EPIPEN 2-PAK) 0.3 mg/0.3 mL IJ SOAJ injection, Inject 0.3 mLs (0.3 mg total) into the muscle once., Disp: 2 Device, Rfl: 1 .  esomeprazole (NEXIUM) 40 MG capsule, Take 1 capsule (40 mg total) by mouth daily at 12 noon., Disp: 90 capsule, Rfl: 3 .  HYDROcodone-acetaminophen (NORCO) 7.5-325 MG per tablet, Take 1 tablet by mouth every 6 (six) hours as needed for moderate pain., Disp: 90 tablet, Rfl: 0 .  levalbuterol (XOPENEX HFA) 45 MCG/ACT inhaler, Inhale 1 puff into the lungs every 4 (four) hours as needed for wheezing., Disp: , Rfl:  .  levalbuterol (XOPENEX) 0.63 MG/3ML nebulizer solution, Take 0.63 mg by nebulization every 4 (four) hours as needed for wheezing or shortness of breath., Disp: , Rfl:  .  montelukast (SINGULAIR) 10 MG  tablet, Take 1 tablet (10 mg total) by mouth at bedtime., Disp: 90 tablet, Rfl: 3 .  olopatadine (PATANOL) 0.1 % ophthalmic solution, Place 1 drop into both eyes 2 (two) times daily., Disp: 5 mL, Rfl: 3 .  omalizumab (XOLAIR) 150 MG injection, Inject 300 mg into the skin every 28 (twenty-eight) days., Disp: , Rfl:  .  predniSONE (DELTASONE) 1 MG tablet, CAN TAKE 2-5 TABLETS DAILY AS NEEDED, Disp: 100 tablet, Rfl: 3 .  predniSONE (DELTASONE) 2.5 MG tablet, Take 2.5 mg by mouth daily with breakfast. Takes this most days but if breathing is bad she takes 5 mg, Disp: , Rfl:  .  Vitamin D, Ergocalciferol, (DRISDOL) 50000 UNITS CAPS capsule, TAKE 1 CAPSULE (50,000 UNITS TOTAL) BY MOUTH EVERY 7 (SEVEN) DAYS., Disp: 12 capsule, Rfl: 1     Review of Systems  Constitutional: Positive for fatigue. Negative for fever and unexpected weight change.  HENT: Negative for congestion, dental problem, ear pain, nosebleeds, postnasal drip, rhinorrhea, sinus pressure, sneezing, sore throat and trouble swallowing.   Eyes: Negative for redness and itching.  Respiratory: Positive for cough. Negative for chest tightness, shortness of breath and wheezing.     Cardiovascular: Negative for palpitations and leg swelling.  Gastrointestinal: Negative for nausea and vomiting.  Genitourinary: Negative for dysuria.  Musculoskeletal: Negative for joint swelling.  Skin: Negative for rash.  Neurological: Negative for headaches.  Hematological: Does not bruise/bleed easily.  Psychiatric/Behavioral: Negative for dysphoric mood. The patient is not nervous/anxious.        Objective:   Physical Exam  Constitutional: She is oriented to person, place, and time. She appears well-developed and well-nourished. No distress.  HENT:  Head: Normocephalic and atraumatic.  Right Ear: External ear normal.  Left Ear: External ear normal.  Mouth/Throat: Oropharynx is clear and moist. No oropharyngeal exudate.  Eyes: Conjunctivae and EOM are normal. Pupils are equal, round, and reactive to light. Right eye exhibits no discharge. Left eye exhibits no discharge. No scleral icterus.  Neck: Normal range of motion. Neck supple. No JVD present. No tracheal deviation present. No thyromegaly present.  Cardiovascular: Normal rate, regular rhythm, normal heart sounds and intact distal pulses.  Exam reveals no gallop and no friction rub.   No murmur heard. Pulmonary/Chest: Effort normal and breath sounds normal. No respiratory distress. She has no wheezes. She has no rales. She exhibits no tenderness.  Abdominal: Soft. Bowel sounds are normal. She exhibits no distension and no mass. There is no tenderness. There is no rebound and no guarding.  Musculoskeletal: Normal range of motion. She exhibits no edema or tenderness.  bruse from fall left triceps area  Lymphadenopathy:    She has no cervical adenopathy.  Neurological: She is alert and oriented to person, place, and time. She has normal reflexes. No cranial nerve deficit. She exhibits normal muscle tone. Coordination normal.  Skin: Skin is warm and dry. No rash noted. She is not diaphoretic. No erythema. No pallor.   Psychiatric: She has a normal mood and affect. Her behavior is normal. Judgment and thought content normal.  Vitals reviewed.   Filed Vitals:   01/02/15 1437  BP: 132/70  Pulse: 62  Height: 5\' 4"  (1.626 m)  Weight: 165 lb (74.844 kg)  SpO2: 98%         Assessment & Plan:     ICD-9-CM ICD-10-CM   1. Asthma, severe persistent, uncomplicated 732.20 U54.27 CT Chest Wo Contrast  2. Dyspnea 786.09 R06.00 Spirometry with Graph  3. Lung  nodules 793.19 R91.8 CT Chest Wo Contrast   #ASthma severe persistent Asthma appears improved even with prednisone taper Continue current medications; symbicort,and  Singulair, Looks like prednisone taper going well  -  Currentl you are on prednisone 1mg  on Monday, WEd, Friday for 2 weeks  - then sTOP  Continue  Xolair dose by half at your request Keep sinus under control through Dr Constance Holster and saline spray Keep reflux under control with nexium and low glycemic diet IF asthma flares up, call us for prednisone burst or come in   #Lung nodules  - do fu CT chest wo contrast   $ Followup IF worse, call us Followup 4-6 weeks or sooner Spirometry in office at followup (not PFT by June Leap)    Dr. Brand Males, M.D., [facility].C.P Pulmonary and Critical Care Medicine Staff Physician [facility] Pager: [phone], If no answer or between  15:00h - 7:00h: call [phone]  01/02/2015 3:13 PM

## 2015-01-03 ENCOUNTER — Encounter (HOSPITAL_COMMUNITY)
Admission: RE | Admit: 2015-01-03 | Discharge: 2015-01-03 | Disposition: A | Discharge status: Home / Self Care | Payer: Medicare Other | Source: Ambulatory Visit | Attending: Pulmonary Disease | Admitting: Pulmonary Disease

## 2015-01-03 ENCOUNTER — Telehealth: Payer: Self-pay | Admitting: Internal Medicine

## 2015-01-04 NOTE — Telephone Encounter (Signed)
Pt called and informed that rx is ready to be picked up.

## 2015-01-05 ENCOUNTER — Encounter (HOSPITAL_COMMUNITY)
Admission: RE | Admit: 2015-01-05 | Discharge: 2015-01-05 | Disposition: A | Discharge status: Home / Self Care | Payer: Medicare Other | Source: Ambulatory Visit | Attending: Pulmonary Disease | Admitting: Pulmonary Disease

## 2015-01-09 ENCOUNTER — Ambulatory Visit (INDEPENDENT_AMBULATORY_CARE_PROVIDER_SITE_OTHER)
Admission: RE | Admit: 2015-01-09 | Discharge: 2015-01-09 | Disposition: A | Discharge status: Home / Self Care | Payer: Medicare Other | Source: Ambulatory Visit | Attending: Internal Medicine | Admitting: Internal Medicine

## 2015-01-09 ENCOUNTER — Ambulatory Visit (INDEPENDENT_AMBULATORY_CARE_PROVIDER_SITE_OTHER): Payer: Medicare Other

## 2015-01-09 DIAGNOSIS — J4540 Moderate persistent asthma, uncomplicated: Secondary | ICD-10-CM | POA: Diagnosis not present

## 2015-01-09 DIAGNOSIS — R918 Other nonspecific abnormal finding of lung field: Secondary | ICD-10-CM | POA: Diagnosis not present

## 2015-01-09 DIAGNOSIS — J4550 Severe persistent asthma, uncomplicated: Secondary | ICD-10-CM

## 2015-01-09 DIAGNOSIS — I2510 Atherosclerotic heart disease of native coronary artery without angina pectoris: Secondary | ICD-10-CM | POA: Diagnosis not present

## 2015-01-09 MED ORDER — OMALIZUMAB 150 MG ~~LOC~~ SOLR
150.0000 mg | Freq: Once | SUBCUTANEOUS | Status: AC
  Administered 2015-01-09: 150 mg via SUBCUTANEOUS

## 2015-01-10 ENCOUNTER — Encounter (HOSPITAL_COMMUNITY): Payer: Medicare Other

## 2015-01-10 DIAGNOSIS — K219 Gastro-esophageal reflux disease without esophagitis: Secondary | ICD-10-CM | POA: Insufficient documentation

## 2015-01-10 DIAGNOSIS — J449 Chronic obstructive pulmonary disease, unspecified: Secondary | ICD-10-CM | POA: Insufficient documentation

## 2015-01-10 DIAGNOSIS — Z5189 Encounter for other specified aftercare: Secondary | ICD-10-CM | POA: Diagnosis not present

## 2015-01-10 DIAGNOSIS — G4730 Sleep apnea, unspecified: Secondary | ICD-10-CM | POA: Insufficient documentation

## 2015-01-10 DIAGNOSIS — J45909 Unspecified asthma, uncomplicated: Secondary | ICD-10-CM | POA: Insufficient documentation

## 2015-01-10 DIAGNOSIS — M810 Age-related osteoporosis without current pathological fracture: Secondary | ICD-10-CM | POA: Insufficient documentation

## 2015-01-12 ENCOUNTER — Encounter (HOSPITAL_COMMUNITY): Payer: Medicare Other

## 2015-01-17 ENCOUNTER — Encounter (HOSPITAL_COMMUNITY)
Admission: RE | Admit: 2015-01-17 | Discharge: 2015-01-17 | Disposition: A | Discharge status: Home / Self Care | Payer: Medicare Other | Source: Ambulatory Visit | Attending: Pulmonary Disease | Admitting: Pulmonary Disease

## 2015-01-19 ENCOUNTER — Encounter (HOSPITAL_COMMUNITY)
Admission: RE | Admit: 2015-01-19 | Discharge: 2015-01-19 | Disposition: A | Discharge status: Home / Self Care | Payer: Medicare Other | Source: Ambulatory Visit | Attending: Pulmonary Disease | Admitting: Pulmonary Disease

## 2015-01-24 ENCOUNTER — Telehealth: Payer: Self-pay | Admitting: Internal Medicine

## 2015-01-24 ENCOUNTER — Encounter (HOSPITAL_COMMUNITY): Payer: Medicare Other

## 2015-01-24 DIAGNOSIS — I2510 Atherosclerotic heart disease of native coronary artery without angina pectoris: Secondary | ICD-10-CM

## 2015-01-24 DIAGNOSIS — I2584 Coronary atherosclerosis due to calcified coronary lesion: Principal | ICD-10-CM

## 2015-01-24 NOTE — Telephone Encounter (Signed)
pulm nodules are very small and unchanged in march 2016 ct. 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

## 2015-01-24 NOTE — Telephone Encounter (Signed)
Called and spoke to pt. Informed pt of the recs per MR. Referral order placed. Pt verbalized understanding and denied any further questions or concerns at this time.

## 2015-01-25 ENCOUNTER — Telehealth: Payer: Self-pay | Admitting: Internal Medicine

## 2015-01-25 NOTE — Telephone Encounter (Signed)
Pt calling back to review MR notes again about CT.  Results have been explained to patient, pt expressed understanding. Nothing further needed.  Maurice March, RN at 01/24/2015 5:15 PM     Status: Signed       Expand All Collapse All   Called and spoke to pt. Informed pt of the recs per MR. Referral order placed. Pt verbalized understanding and denied any further questions or concerns at this time.              Brand Males, MD at 01/24/2015 2:54 PM     Status: Signed       Expand All Collapse All   pulm nodules are very small and unchanged in march 2016 ct. 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

## 2015-01-25 NOTE — Telephone Encounter (Signed)
Let pt know that RX was ready for pick up at front desk

## 2015-01-26 ENCOUNTER — Encounter (HOSPITAL_COMMUNITY)
Admission: RE | Admit: 2015-01-26 | Discharge: 2015-01-26 | Disposition: A | Discharge status: Home / Self Care | Payer: Medicare Other | Source: Ambulatory Visit | Attending: Pulmonary Disease | Admitting: Pulmonary Disease

## 2015-01-26 ENCOUNTER — Ambulatory Visit: Payer: Medicare Other | Admitting: Internal Medicine

## 2015-01-30 ENCOUNTER — Encounter: Payer: Self-pay | Admitting: Adult Health

## 2015-01-30 ENCOUNTER — Ambulatory Visit (INDEPENDENT_AMBULATORY_CARE_PROVIDER_SITE_OTHER): Payer: Medicare Other | Admitting: Adult Health

## 2015-01-30 VITALS — BP 128/80 | HR 62 | Ht 64.0 in | Wt 161.0 lb

## 2015-01-30 DIAGNOSIS — I2584 Coronary atherosclerosis due to calcified coronary lesion: Secondary | ICD-10-CM | POA: Diagnosis not present

## 2015-01-30 DIAGNOSIS — J4550 Severe persistent asthma, uncomplicated: Secondary | ICD-10-CM

## 2015-01-30 DIAGNOSIS — J309 Allergic rhinitis, unspecified: Secondary | ICD-10-CM | POA: Diagnosis not present

## 2015-01-30 DIAGNOSIS — R0602 Shortness of breath: Secondary | ICD-10-CM

## 2015-01-30 DIAGNOSIS — I2510 Atherosclerotic heart disease of native coronary artery without angina pectoris: Secondary | ICD-10-CM | POA: Diagnosis not present

## 2015-01-30 NOTE — Patient Instructions (Signed)
#  ASthma severe persistent Continue current medications; symbicort,and  Singulair, Continue  Xolair dose by half at your request Keep sinus under control through Dr Constance Holster and saline spray Keep reflux under control with nexium and low glycemic diet IF asthma flares up, call us for prednisone burst or come in    follow up [name] in 3 months and As needed

## 2015-01-30 NOTE — Assessment & Plan Note (Signed)
Follow-up with cardiology as planned °

## 2015-01-30 NOTE — Assessment & Plan Note (Signed)
Flare with change in season  Plan cont max sinus tx .

## 2015-01-30 NOTE — Progress Notes (Signed)
Subjective:    Patient ID: [name] [name], female    DOB: [birth year], 74 y.o.   MRN: [id number]  HPI   1. ASTHMA - Baseline Chronic High Risk (multiple admits), Allergic, Moderate-Severe Persistent Asthma  - Allergies, gerd, sinus exacerbators.  - Frequent prednisone -> daily low dose prednisone since FEb 20112  - PFT 11/06/2010 - Mixed obstruction - restriction - fev1 1L/50%, Ratio 54, 24% BD response, TLC 3.6/78%, DLCO 16/75%  - May 2012: Switched asthma care to [name]  - June 2012 PFT - showed normalization with prednisone: PFts 04/18/2011 shows NORMALIZATION (fev1 1.8L/93%, ratio 68, 9% BD response, DLCO 86).  - August 2012: Fev1: 1.1lL/58% -> prednisone burst-> 1.09L/60%, Ratio 65 (no  - Nov 2012: Fev1 0.97L/51%, Ratio 60 -> pred burst  - Dec 2012: Fev1 0.7L/37%, Ratio 67%  - August 2014: FEV1  1.17 L/62% - February 2016: FEV1 1.34/72%, ratio 71  2. Bronchiectasis, (mild RUL) seen on CT chest - April 2012 ( ABPA wu tests on prednisone in June 2012: asp antibody IgE asp fumigatus 0.24 ku/L which is borderline/equivocal, IgG for all all aspergiluus preciptins - NEGATIVE. 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 Allergic rhinitis and allergic asthma.  - 2000s: - per hx strongly skin test positive at Dr Neldon Mc office  - 2004 - trialed xolair - per hx effective but developed rash which she subjectively attributed to twice daily dosing (thogh chart review 2004-2005 states it was stopped due to subjective lack of improvement and an incidental rash resolution was noted in retrospect after stopping xolair)  - 2009: IgE 306 in July 2009. RAST positive for various grass, oak, elm, ragweed, plantain, lamb etc., s/p Allergy shot trial with Dr Annamaria Boots - stopped due to transport issues  - 2012: May - IgE 588.  - 2012: June - moved allergy MD to Dr Hardie Pulley  - 2013: Restart Xolair    4. Lung nodule, 6 mm, on CT scan. - April  2012. Possibly calcified. RUL (no prior for comparison but a 07/17/2000 CT chest reports simillar size nodule in similar location). Repeat CT needed April 2013   5. Gastroesophageal reflux disease with hx of UGI scope showing Hiatal hernia (small)  - delayed gastric empyting 2010 March  6. History of sleep apnea.- non compliant with CPAP   7. Significant anxiety (also mood lability hx with prednisone, worsening anxiety with albuterol)    8. Recurrent AE-ASthma  - Admitted 4/23-4/30/12  - OPD AE asthma Rx: July 2012, august 2012, Nov 2012  - Office treatment with Levaquin and prednisone-November 2013 -  Levaquin and prednisone -January 2014   #Obesity  - 192# - nov 2013. STart low glycemic diet  - 178#  - 03/10/2013. Body mass index is 31.03 kg/(m^2). - 175# - 04/08/13 - 168# - 05/12/2013 with Body mass index is 28.93 kg/(m^2). - 167# on 07/07/2013 with Body mass index is 28.65 kg/(m^2). -  154# on 10/18/2013 wuth Body mass index is 26.42 kg/(m^2).  - Body mass index is 27.79 kg/(m^2). on 07/22/2014    OV 11/21/2014 Chief Complaint  Patient presents with  . Follow-up    Pt stated her breathing is unchanged. Pt c/o DOE, prod cough clear mucus. Pt denies CP/tightness.   Follow-up severe persistent asthma. With question of noncompliance which she denies  At baseline she is on symbicort, xolair, singulair, daily prednisone 2-5mg  a sinus therapy /. Last  visit 07/22/2014 I tried Spiriva but she tells me that this did not help her. She tells me that overall asthma stable although in the last few days she might be a little bit more short of breath. She skin on having a spirometry today. Main issues that she now feels a subjective desire to come off Xolair at least by half dose because of shoulder problems and headaches. She is also keen on gradually reducing and stopping her daily prednisone. Even though she is severe persistent asthma she says she wants to give this a try because she is frustrated  by daily prednisone  Spirometry in the office today: shows improvedment fev1 1.22L/66%. FVC 2.4L/72%, ratio 70   Past, Family, Social reviewed: no change since last visit other than her friend with IPF Lilli Few died and she is upset.     OV 2015/01/06   Chief Complaint  Patient presents with  . Follow-up    Pt stated after weaning off her pred she has not noticed a difference. Pt state she is also only doing one  Xolair injection instead of two. Pt c/o mild cough with little mucus production and mild fatigue. Pt denies SOB, CP/tightness.     Follow-up severe persistent asthma with questionable compliance which she denies  At last visit she expresses a keen interest to coming off prednisone. Currently she is coming down on prednisone. She is on a taper which she is taking only 1 mg 3 times a week. In the next 2 weeks she's noticed stop prednisone altogether. She continues with Symbicort and Singulair and she is on Xolair injections at a lower dose. She says she is doing well overall. There are no big issues.  Of note in 2013 she had some subcentimeter very small pulmonary nodules. She is asking for surveillance CT scan for the same  01/30/2015 Follow up : Asthma /Lung nodules  Returns for 1 month follow up .  Spirometry today shows FEV1 60%, ratio 61%, decreased midflows.  Has tapered off steroids  Says last 2 days with spring blooms and drastic change in weather breathing not quite as good.  No wheezing or cough. More nasal drainage.  No discolored mucus, fever, chest pain or edema.  We discussed steroid use for flare -she wants to hold off and see if gets better.  Remains on Xolair (1/2 dose) and symbicort.  Has lung nodules, last CT chest showed stable mult small pulm nodules- no change, consider benign with stability over last 2 years.  Athersclerosis noted in coronaries, she has ov next with week with cardiology .    Spirometry today per MR.  Wants to discuss results of CT  scan.  Wants samples of Symbicort. Sinus pressure, sinus drainage Review of Systems  Constitutional: Positive for fatigue. Negative for fever and unexpected weight change.  HENT: Negative for congestion, dental problem, ear pain, nosebleeds, postnasal drip, rhinorrhea, sinus pressure, sneezing, sore throat and trouble swallowing.   Eyes: Negative for redness and itching.  Respiratory: Positive for cough. Negative for chest tightness, shortness of breath and wheezing.   Cardiovascular: Negative for palpitations and leg swelling.  Gastrointestinal: Negative for nausea and vomiting.  Genitourinary: Negative for dysuria.  Musculoskeletal: Negative for joint swelling.  Skin: Negative for rash.  Neurological: Negative for headaches.  Hematological: Does not bruise/bleed easily.  Psychiatric/Behavioral: Negative for dysphoric mood. The patient is not nervous/anxious.        Objective:   Physical Exam  Constitutional: She is oriented to person,  place, and time. She appears well-developed and well-nourished. No distress.  HENT:  Head: Normocephalic and atraumatic.  Right Ear: External ear normal.  Left Ear: External ear normal.  Mouth/Throat: Oropharynx is clear and moist. No oropharyngeal exudate.  Eyes: Conjunctivae and EOM are normal. Pupils are equal, round, and reactive to light. Right eye exhibits no discharge. Left eye exhibits no discharge. No scleral icterus.  Neck: Normal range of motion. Neck supple. No JVD present. No tracheal deviation present. No thyromegaly present.  Cardiovascular: Normal rate, regular rhythm, normal heart sounds and intact distal pulses.  Exam reveals no gallop and no friction rub.   No murmur heard. Pulmonary/Chest: Effort normal and breath sounds normal. No respiratory distress. She has no wheezes. She has no rales. She exhibits no tenderness.  Abdominal: Soft. Bowel sounds are normal. She exhibits no distension and no mass. There is no tenderness. There is no  rebound and no guarding.  Musculoskeletal: Normal range of motion. She exhibits no edema or tenderness.  Lymphadenopathy:    She has no cervical adenopathy.  Neurological: She is alert and oriented to person, place, and time. She has normal reflexes. No cranial nerve deficit. She exhibits normal muscle tone. Coordination normal.  Skin: Skin is warm and dry. No rash noted. She is not diaphoretic. No erythema. No pallor.  Psychiatric: She has a normal mood and affect. Her behavior is normal. Judgment and thought content normal.  Vitals reviewed.       Assessment & Plan:

## 2015-01-30 NOTE — Assessment & Plan Note (Signed)
Doing okay with slight drop in FEV1 with change in season/weather.  Will hold on steroid burst for now , advised if worsens will need to call in for steroids.   Plan   Continue current medications; symbicort,and  Singulair, Continue  Xolair dose by half at your request Keep sinus under control through Dr Constance Holster and saline spray Keep reflux under control with nexium and low glycemic diet IF asthma flares up, call us for prednisone burst or come in

## 2015-01-31 ENCOUNTER — Encounter (HOSPITAL_COMMUNITY)
Admission: RE | Admit: 2015-01-31 | Discharge: 2015-01-31 | Disposition: A | Discharge status: Home / Self Care | Payer: Medicare Other | Source: Ambulatory Visit | Attending: Pulmonary Disease | Admitting: Pulmonary Disease

## 2015-01-31 ENCOUNTER — Telehealth: Payer: Self-pay | Admitting: Adult Health

## 2015-01-31 ENCOUNTER — Other Ambulatory Visit: Payer: Self-pay | Admitting: Internal Medicine

## 2015-01-31 NOTE — Telephone Encounter (Signed)
Pt is aware that we do not have samples at this time. Nothing further was needed.

## 2015-01-31 NOTE — Telephone Encounter (Signed)
Patient is requesting refill script on hydrocodone

## 2015-02-01 MED ORDER — HYDROCODONE-ACETAMINOPHEN 7.5-325 MG PO TABS: 1.0000 | ORAL_TABLET | Freq: Four times a day (QID) | ORAL | Status: DC | PRN

## 2015-02-01 NOTE — Telephone Encounter (Signed)
Printed rx waiting on md to sign tomorrow...Johny Chess

## 2015-02-01 NOTE — Telephone Encounter (Signed)
i will sign rx refill upon my return to clinic in AM Pt MUST have UDS submitted before pick up of this rx (see 10/2014 phone note -?no UDS on chart since 04/2014)

## 2015-02-02 ENCOUNTER — Encounter (HOSPITAL_COMMUNITY): Payer: Medicare Other

## 2015-02-02 NOTE — Telephone Encounter (Signed)
Called pt no answer LMOM with md response.../lmb 

## 2015-02-06 ENCOUNTER — Ambulatory Visit (INDEPENDENT_AMBULATORY_CARE_PROVIDER_SITE_OTHER): Payer: Medicare Other

## 2015-02-06 ENCOUNTER — Telehealth: Payer: Self-pay | Admitting: Internal Medicine

## 2015-02-06 DIAGNOSIS — J4540 Moderate persistent asthma, uncomplicated: Secondary | ICD-10-CM

## 2015-02-06 DIAGNOSIS — Z79891 Long term (current) use of opiate analgesic: Secondary | ICD-10-CM | POA: Diagnosis not present

## 2015-02-06 MED ORDER — OMALIZUMAB 150 MG ~~LOC~~ SOLR
150.0000 mg | Freq: Once | SUBCUTANEOUS | Status: AC
  Administered 2015-02-06: 150 mg via SUBCUTANEOUS

## 2015-02-06 NOTE — Telephone Encounter (Signed)
Pt aware we do not have Symbicort samples. Nothing further needed at this time.

## 2015-02-07 ENCOUNTER — Encounter: Payer: Self-pay | Admitting: Nurse Practitioner

## 2015-02-07 ENCOUNTER — Encounter (HOSPITAL_COMMUNITY)
Admission: RE | Admit: 2015-02-07 | Discharge: 2015-02-07 | Disposition: A | Discharge status: Home / Self Care | Payer: Medicare Other | Source: Ambulatory Visit | Attending: Pulmonary Disease | Admitting: Pulmonary Disease

## 2015-02-07 ENCOUNTER — Ambulatory Visit (INDEPENDENT_AMBULATORY_CARE_PROVIDER_SITE_OTHER): Payer: Medicare Other | Admitting: Nurse Practitioner

## 2015-02-07 VITALS — BP 132/80 | HR 55 | Ht 64.0 in | Wt 164.4 lb

## 2015-02-07 DIAGNOSIS — I10 Essential (primary) hypertension: Secondary | ICD-10-CM

## 2015-02-07 DIAGNOSIS — I2584 Coronary atherosclerosis due to calcified coronary lesion: Secondary | ICD-10-CM

## 2015-02-07 DIAGNOSIS — I2510 Atherosclerotic heart disease of native coronary artery without angina pectoris: Secondary | ICD-10-CM

## 2015-02-07 LAB — TROPONIN I: TNIDX: 0.01 ug/l (ref 0.00–0.06)
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
## 2015-02-07 NOTE — Patient Instructions (Addendum)
We will be checking the following labs today STAT troponin  You will get a phone call if this test is +  Stay on your current medicines  We will see you back as needed  Call the [place] office at [phone] if you have any questions, problems or concerns.

## 2015-02-09 ENCOUNTER — Encounter: Payer: Self-pay | Admitting: Family

## 2015-02-09 ENCOUNTER — Telehealth: Payer: Self-pay | Admitting: Family

## 2015-02-09 ENCOUNTER — Encounter (HOSPITAL_COMMUNITY)
Admission: RE | Admit: 2015-02-09 | Discharge: 2015-02-09 | Disposition: A | Discharge status: Home / Self Care | Payer: Medicare Other | Source: Ambulatory Visit | Attending: Pulmonary Disease | Admitting: Pulmonary Disease

## 2015-02-09 ENCOUNTER — Ambulatory Visit (INDEPENDENT_AMBULATORY_CARE_PROVIDER_SITE_OTHER)
Admission: RE | Admit: 2015-02-09 | Discharge: 2015-02-09 | Disposition: A | Discharge status: Home / Self Care | Payer: Medicare Other | Source: Ambulatory Visit | Attending: Family | Admitting: Family

## 2015-02-09 ENCOUNTER — Ambulatory Visit (INDEPENDENT_AMBULATORY_CARE_PROVIDER_SITE_OTHER): Payer: Medicare Other | Admitting: Family

## 2015-02-09 ENCOUNTER — Ambulatory Visit: Payer: Medicare Other | Admitting: Internal Medicine

## 2015-02-09 DIAGNOSIS — R51 Headache: Secondary | ICD-10-CM | POA: Diagnosis not present

## 2015-02-09 DIAGNOSIS — I2510 Atherosclerotic heart disease of native coronary artery without angina pectoris: Secondary | ICD-10-CM | POA: Diagnosis not present

## 2015-02-09 DIAGNOSIS — J3489 Other specified disorders of nose and nasal sinuses: Secondary | ICD-10-CM | POA: Diagnosis not present

## 2015-02-09 DIAGNOSIS — I2584 Coronary atherosclerosis due to calcified coronary lesion: Secondary | ICD-10-CM

## 2015-02-09 DIAGNOSIS — R519 Headache, unspecified: Secondary | ICD-10-CM

## 2015-02-09 MED ORDER — LEVAQUIN 500 MG PO TABS: 500.0000 mg | ORAL_TABLET | Freq: Every day | ORAL | Status: DC

## 2015-02-09 NOTE — Patient Instructions (Signed)
Thank you for choosing [facility].  Summary/Instructions:  Your prescription(s) have been submitted to your pharmacy or been printed and provided for you. Please take as directed and contact our office if you believe you are having problem(s) with the medication(s) or have any questions.  Please stop by radiology on the basement level of the building for your x-rays. Your results will be released to MyChart (or called to you) after review, usually within 72 hours after test completion. If any treatments or changes are necessary, you will be notified at that same time.  If your symptoms worsen or fail to improve, please contact our office for further instruction, or in case of emergency go directly to the emergency room at the closest medical facility.     

## 2015-02-09 NOTE — Assessment & Plan Note (Signed)
Patient presents with symptoms of maxillary sinus pain. Given location and description of pain and symptoms low concern for temporal arteritis. Start Levaquin for sinus infection. Obtain sinus x-ray and jaw x-rays to rule out underlying pathology. Instructed to go the ED for follow up if symptoms worsen or fail to improve.

## 2015-02-09 NOTE — Progress Notes (Signed)
[name] [name] presented at Pulmonary rehab today with complaint of left jaw pain which has been ongoing for the last week, but the pain has been getting worse.  She saw [name] [name] two days ago for the same complaint and troponin level was negative.  I suggested going to the emergency room or urgent care and patient refused.  She only wants to see her primary care physician, [name] [name].  I called and got an appointment to see [name] [name] today at 1:15pm.  Patient was agreeable.  I called her friend [name] [name] to pick her up and take her for the appointment.  Patient says she is going to the office early and sit in the waiting room.  She has been congested for 2 weeks and has "sinus problems", not sure if she has a sinus/tooth infection.  She left the department in no acute distress, but teary from the left jaw pain.  She did not sleep well last night and was up and down all night.

## 2015-02-09 NOTE — Progress Notes (Signed)
Pre visit review using our clinic review tool, if applicable. No additional management support is needed unless otherwise documented below in the visit note. 

## 2015-02-09 NOTE — Telephone Encounter (Signed)
Please inform patient that her jaw x-rays show no evidence of fracture and her sinus x-rays show some inflammation. Therefore this may be the potential cause of her problem. If the antibiotics do not work, then we may need to consider a CT scan for further evaluation.

## 2015-02-09 NOTE — Progress Notes (Signed)
Subjective:    Patient ID: [name] [name], female    DOB: 02/17/1941, 74 y.o.   MRN: [id number]  Chief Complaint  Patient presents with  . Jaw Pain    having really bad pain in left jaw, x1 week off and on, on a scale to 10 its 10, pt is unable to stay still due to pain, says she does have sinus issues and doesn't know if that has anything to do with it    HPI:  [name] [name] is a 74 y.o. female who presents today for an acute visit.   This is a new problem. Associated symptom of pain located in her left jaw/cheek has been waxing and waning for about 1 week. Describes the pain as achy and sharp with an intensity of 10/10. Modifying factors include cold/warm compress and hydrocodone-acetaminphen which has not helped with the pain.   Allergies  Allergen Reactions  . 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 Latex Anaphylaxis  . Advair Diskus [Fluticasone-Salmeterol]     Tingling in mouth/ears ringing  . Diclofenac Sodium     REACTION: Hives  . Doxycycline Nausea And Vomiting  . Dulera [Mometasone Furo-Formoterol Fum]     HA  . Penicillins     Tongue swelling  . Sulfonamide Derivatives     Tongue swelling   . Tiotropium Bromide Monohydrate     Tongue/mouth itching Dysuria   . Albuterol Anxiety    Switched to xopenex    Current Outpatient Prescriptions on File Prior to Visit  Medication Sig Dispense Refill  . ALPRAZolam (XANAX) 0.25 MG tablet Take 1 tablet (0.25 mg total) by mouth 2 (two) times daily. 60 tablet 3  . amLODipine (NORVASC) 5 MG tablet Take 1 tablet (5 mg total) by mouth every morning. 90 tablet 3  . budesonide-formoterol (SYMBICORT) 160-4.5 MCG/ACT inhaler Inhale 2 puffs into the lungs 2 (two) times daily. 3 Inhaler 3  . cycloSPORINE (RESTASIS) 0.05 % ophthalmic emulsion Place 1 drop into both eyes 2 (two) times daily. 0.4 mL 0  . desloratadine (CLARINEX) 5 MG tablet Take 1 tablet (5 mg total) by mouth daily. 90 tablet 3  . EPINEPHrine  (EPIPEN 2-PAK) 0.3 mg/0.3 mL IJ SOAJ injection Inject 0.3 mLs (0.3 mg total) into the muscle once. 2 Device 1  . esomeprazole (NEXIUM) 40 MG capsule Take 1 capsule (40 mg total) by mouth daily at 12 noon. 90 capsule 3  . HYDROcodone-acetaminophen (NORCO) 7.5-325 MG per tablet Take 1 tablet by mouth every 6 (six) hours as needed for moderate pain. 90 tablet 0  . levalbuterol (XOPENEX HFA) 45 MCG/ACT inhaler Inhale 1 puff into the lungs every 4 (four) hours as needed for wheezing.    . levalbuterol (XOPENEX) 0.63 MG/3ML nebulizer solution Take 0.63 mg by nebulization every 4 (four) hours as needed for wheezing or shortness of breath.    . montelukast (SINGULAIR) 10 MG tablet Take 1 tablet (10 mg total) by mouth at bedtime. 90 tablet 3  . olopatadine (PATANOL) 0.1 % ophthalmic solution Place 1 drop into both eyes 2 (two) times daily. 5 mL 3  . omalizumab (XOLAIR) 150 MG injection Inject 300 mg into the skin every 28 (twenty-eight) days.    . Vitamin D, Ergocalciferol, (DRISDOL) 50000 UNITS CAPS capsule TAKE 1 CAPSULE (50,000 UNITS TOTAL) BY MOUTH EVERY 7 (SEVEN) DAYS. 12 capsule 1   No current facility-administered medications on file prior to visit.    Past Medical  History  Diagnosis Date  . Sleep apnea   . ALLERGIC RHINITIS   . Colon polyp, hyperplastic 02/2003, 03/2014  . Chronic rhinosinusitis   . Hyperlipidemia   . Osteoporosis   . GERD (gastroesophageal reflux disease)   . Hiatal hernia   . Helicobacter pylori gastritis 02/2009    partially treated  . Gastroparesis 2010  . Chronic obstructive asthma     PFT 11/06/10 - FEV1 1.24/ 0.62; FEV1/FVC 0.56, TLC 0.78; DLCO 0.75  . Allergic asthma     h/o  . Bronchiectasis     h/o  . Hypertension   . COPD (chronic obstructive pulmonary disease)   . Anxiety     Review of Systems  Constitutional: Negative for fever and chills.  HENT: Positive for facial swelling and sinus pressure. Negative for congestion, mouth sores and sore throat.         Positive for jaw pain      Objective:    BP 170/102 mmHg  Pulse 86  Temp(Src) 98.2 F (36.8 C) (Oral)  Resp 20  Ht 5\' 4"  (1.626 m)  Wt 160 lb 6.4 oz (72.757 kg)  BMI 27.52 kg/m2  SpO2 96% Nursing note and vital signs reviewed.  Physical Exam  Constitutional: She is oriented to person, place, and time. She appears well-developed and well-nourished. She appears distressed.  HENT:  Right Ear: Hearing, tympanic membrane, external ear and ear canal normal.  Left Ear: Hearing, tympanic membrane, external ear and ear canal normal.  Nose: Right sinus exhibits no maxillary sinus tenderness and no frontal sinus tenderness. Left sinus exhibits maxillary sinus tenderness. Left sinus exhibits no frontal sinus tenderness.  Mouth/Throat: Oropharynx is clear and moist and mucous membranes are normal. Normal dentition. No dental abscesses or dental caries.  Cardiovascular: Normal rate, regular rhythm, normal heart sounds and intact distal pulses.   Pulmonary/Chest: Effort normal and breath sounds normal.  Neurological: She is alert and oriented to person, place, and time.  Skin: Skin is warm and dry.  Psychiatric: She has a normal mood and affect. Her behavior is normal. Judgment and thought content normal.       Assessment & Plan:

## 2015-02-10 ENCOUNTER — Telehealth: Payer: Self-pay | Admitting: Internal Medicine

## 2015-02-10 ENCOUNTER — Other Ambulatory Visit: Payer: Self-pay | Admitting: Family

## 2015-02-10 MED ORDER — AZITHROMYCIN 250 MG PO TABS: ORAL_TABLET | ORAL | Status: DC

## 2015-02-10 NOTE — Telephone Encounter (Signed)
Pt called read note to her below.  She said ok

## 2015-02-10 NOTE — Telephone Encounter (Signed)
Pt called stated the antibiotic need a PA, pt stated she is in pain and not sure what we need to do. Please help

## 2015-02-10 NOTE — Telephone Encounter (Signed)
New antibiotic sent to pharmacy. 

## 2015-02-14 ENCOUNTER — Encounter (HOSPITAL_COMMUNITY): Payer: Medicare Other

## 2015-02-14 DIAGNOSIS — G4730 Sleep apnea, unspecified: Secondary | ICD-10-CM | POA: Insufficient documentation

## 2015-02-14 DIAGNOSIS — M810 Age-related osteoporosis without current pathological fracture: Secondary | ICD-10-CM | POA: Insufficient documentation

## 2015-02-14 DIAGNOSIS — J449 Chronic obstructive pulmonary disease, unspecified: Secondary | ICD-10-CM | POA: Insufficient documentation

## 2015-02-14 DIAGNOSIS — K219 Gastro-esophageal reflux disease without esophagitis: Secondary | ICD-10-CM | POA: Insufficient documentation

## 2015-02-14 DIAGNOSIS — Z5189 Encounter for other specified aftercare: Secondary | ICD-10-CM | POA: Insufficient documentation

## 2015-02-14 DIAGNOSIS — J45909 Unspecified asthma, uncomplicated: Secondary | ICD-10-CM | POA: Insufficient documentation

## 2015-02-16 ENCOUNTER — Encounter (HOSPITAL_COMMUNITY): Payer: Self-pay

## 2015-02-19 ENCOUNTER — Other Ambulatory Visit: Payer: Self-pay | Admitting: Internal Medicine

## 2015-02-21 ENCOUNTER — Encounter (HOSPITAL_COMMUNITY)
Admission: RE | Admit: 2015-02-21 | Discharge: 2015-02-21 | Disposition: A | Discharge status: Home / Self Care | Payer: Self-pay | Source: Ambulatory Visit | Attending: Pulmonary Disease | Admitting: Pulmonary Disease

## 2015-02-23 ENCOUNTER — Encounter (HOSPITAL_COMMUNITY)
Admission: RE | Admit: 2015-02-23 | Discharge: 2015-02-23 | Disposition: A | Discharge status: Home / Self Care | Payer: Self-pay | Source: Ambulatory Visit | Attending: Pulmonary Disease | Admitting: Pulmonary Disease

## 2015-02-28 ENCOUNTER — Encounter (HOSPITAL_COMMUNITY)
Admission: RE | Admit: 2015-02-28 | Discharge: 2015-02-28 | Disposition: A | Discharge status: Home / Self Care | Payer: Self-pay | Source: Ambulatory Visit | Attending: Pulmonary Disease | Admitting: Pulmonary Disease

## 2015-02-28 ENCOUNTER — Other Ambulatory Visit: Payer: Self-pay | Admitting: Internal Medicine

## 2015-03-02 ENCOUNTER — Telehealth: Payer: Self-pay | Admitting: Internal Medicine

## 2015-03-02 ENCOUNTER — Encounter (HOSPITAL_COMMUNITY): Payer: Self-pay

## 2015-03-02 MED ORDER — HYDROCODONE-ACETAMINOPHEN 7.5-325 MG PO TABS: 1.0000 | ORAL_TABLET | Freq: Four times a day (QID) | ORAL | Status: DC | PRN

## 2015-03-02 NOTE — Telephone Encounter (Signed)
Patient is requesting refill script for hydrocodone

## 2015-03-06 ENCOUNTER — Ambulatory Visit: Payer: Medicare Other

## 2015-03-06 ENCOUNTER — Other Ambulatory Visit: Payer: Self-pay

## 2015-03-06 ENCOUNTER — Ambulatory Visit (INDEPENDENT_AMBULATORY_CARE_PROVIDER_SITE_OTHER): Payer: Medicare Other

## 2015-03-06 DIAGNOSIS — J4550 Severe persistent asthma, uncomplicated: Secondary | ICD-10-CM

## 2015-03-06 MED ORDER — HYDROCODONE-ACETAMINOPHEN 7.5-325 MG PO TABS: 1.0000 | ORAL_TABLET | Freq: Four times a day (QID) | ORAL | Status: DC | PRN

## 2015-03-06 NOTE — Telephone Encounter (Signed)
LMOVM to pick up

## 2015-03-06 NOTE — Telephone Encounter (Signed)
Rx for hydrocodone was approved and signed by PCP. Called pt and informed that rx was signed and ready for pick up. Today when the pt came in to pick up the rx is no where to be found. I looked in the cabinet, in the shred bins, in the out going mail and inside the mail envelopes and believe that it was sent off to shred.   Can you review and approve new rx to be written

## 2015-03-07 ENCOUNTER — Encounter (HOSPITAL_COMMUNITY): Payer: Self-pay

## 2015-03-09 ENCOUNTER — Encounter (HOSPITAL_COMMUNITY)
Admission: RE | Admit: 2015-03-09 | Discharge: 2015-03-09 | Disposition: A | Discharge status: Home / Self Care | Payer: Self-pay | Source: Ambulatory Visit | Attending: Pulmonary Disease | Admitting: Pulmonary Disease

## 2015-03-10 MED ORDER — OMALIZUMAB 150 MG ~~LOC~~ SOLR
150.0000 mg | Freq: Once | SUBCUTANEOUS | Status: AC
  Administered 2015-03-06: 150 mg via SUBCUTANEOUS

## 2015-03-14 ENCOUNTER — Encounter (HOSPITAL_COMMUNITY)
Admission: RE | Admit: 2015-03-14 | Discharge: 2015-03-14 | Disposition: A | Discharge status: Home / Self Care | Payer: Medicaid Other | Source: Ambulatory Visit | Attending: Pulmonary Disease | Admitting: Pulmonary Disease

## 2015-03-14 DIAGNOSIS — J45909 Unspecified asthma, uncomplicated: Secondary | ICD-10-CM | POA: Insufficient documentation

## 2015-03-14 DIAGNOSIS — K219 Gastro-esophageal reflux disease without esophagitis: Secondary | ICD-10-CM | POA: Insufficient documentation

## 2015-03-14 DIAGNOSIS — M810 Age-related osteoporosis without current pathological fracture: Secondary | ICD-10-CM | POA: Insufficient documentation

## 2015-03-14 DIAGNOSIS — G4730 Sleep apnea, unspecified: Secondary | ICD-10-CM | POA: Insufficient documentation

## 2015-03-14 DIAGNOSIS — Z5189 Encounter for other specified aftercare: Secondary | ICD-10-CM | POA: Diagnosis present

## 2015-03-14 DIAGNOSIS — J449 Chronic obstructive pulmonary disease, unspecified: Secondary | ICD-10-CM | POA: Diagnosis not present

## 2015-03-15 ENCOUNTER — Encounter: Payer: Self-pay | Admitting: Internal Medicine

## 2015-03-15 ENCOUNTER — Other Ambulatory Visit: Payer: Self-pay | Admitting: Internal Medicine

## 2015-03-15 ENCOUNTER — Telehealth: Payer: Self-pay | Admitting: Internal Medicine

## 2015-03-15 NOTE — Telephone Encounter (Signed)
Spoke with pt, advised that we do not have any symbicort 160 inhalers at this time.  Nothing further needed.

## 2015-03-16 ENCOUNTER — Encounter (HOSPITAL_COMMUNITY): Payer: Medicaid Other

## 2015-03-17 ENCOUNTER — Other Ambulatory Visit: Payer: Self-pay | Admitting: Internal Medicine

## 2015-03-21 ENCOUNTER — Telehealth: Payer: Self-pay | Admitting: Internal Medicine

## 2015-03-21 ENCOUNTER — Encounter (HOSPITAL_COMMUNITY)
Admission: RE | Admit: 2015-03-21 | Discharge: 2015-03-21 | Disposition: A | Discharge status: Home / Self Care | Payer: Medicaid Other | Source: Ambulatory Visit | Attending: Pulmonary Disease | Admitting: Pulmonary Disease

## 2015-03-21 NOTE — Telephone Encounter (Signed)
Called and left message for patient advising her that we do not have any samples of Symbicort available right now.  Attempted to contact Pulm Rehab, but had to leave message on voicemail as well.  Nothing further needed.

## 2015-03-21 NOTE — Progress Notes (Signed)
[name] [name] present to pulmonary rehab with more shortness of breath and wheezing today.  She is out of symbicort and Dr. Golden Pop office was called to see if they have any samples patient could have.  I encouraged patient to call for a pulmonary doctor's appointment.  She will check with her daughter first to see when she can take her and then will call for the appointment.  Patient is in no acute distress, but seems to not feel well today.

## 2015-03-23 ENCOUNTER — Encounter: Payer: Self-pay | Admitting: Adult Health

## 2015-03-23 ENCOUNTER — Encounter (HOSPITAL_COMMUNITY): Payer: Medicaid Other

## 2015-03-23 ENCOUNTER — Ambulatory Visit (INDEPENDENT_AMBULATORY_CARE_PROVIDER_SITE_OTHER): Payer: Medicare Other | Admitting: Adult Health

## 2015-03-23 ENCOUNTER — Telehealth (HOSPITAL_COMMUNITY): Payer: Self-pay | Admitting: *Deleted

## 2015-03-23 VITALS — BP 136/84 | HR 75 | Temp 97.7°F | Ht 64.0 in | Wt 162.0 lb

## 2015-03-23 DIAGNOSIS — I2510 Atherosclerotic heart disease of native coronary artery without angina pectoris: Secondary | ICD-10-CM | POA: Diagnosis not present

## 2015-03-23 DIAGNOSIS — I2584 Coronary atherosclerosis due to calcified coronary lesion: Secondary | ICD-10-CM

## 2015-03-23 DIAGNOSIS — J4551 Severe persistent asthma with (acute) exacerbation: Secondary | ICD-10-CM

## 2015-03-23 MED ORDER — METHYLPREDNISOLONE ACETATE 80 MG/ML IJ SUSP
80.0000 mg | Freq: Once | INTRAMUSCULAR | Status: AC
  Administered 2015-03-23: 80 mg via INTRAMUSCULAR

## 2015-03-23 MED ORDER — AZITHROMYCIN 250 MG PO TABS: ORAL_TABLET | ORAL | Status: AC

## 2015-03-23 MED ORDER — PREDNISONE 10 MG PO TABS: ORAL_TABLET | ORAL | Status: DC

## 2015-03-23 NOTE — Addendum Note (Signed)
Addended by: Parke Poisson E on: 03/23/2015 05:01 PM   Modules accepted: Orders

## 2015-03-23 NOTE — Assessment & Plan Note (Signed)
Exacerbation  Depo medrol IM x 1 in office    Plan  Prednisone taper over next week.  Zpack take as directed.  Continue current medications; symbicort,and  Singulair, Continue  Xolair  Follow up [name] next month as planned  Please contact office for sooner follow up if symptoms do not improve or worsen or seek emergency care

## 2015-03-23 NOTE — Patient Instructions (Addendum)
Prednisone taper over next week.  Zpack take as directed.  Continue current medications; symbicort,and  Singulair, Continue  Xolair  Follow up [name] next month as planned  Please contact office for sooner follow up if symptoms do not improve or worsen or seek emergency care

## 2015-03-23 NOTE — Progress Notes (Signed)
Subjective:    Patient ID: [name] [name], female    DOB: 1941/01/17, 74 y.o.   MRN: [id number]  HPI   1. ASTHMA - Baseline Chronic High Risk (multiple admits), Allergic, Moderate-Severe Persistent Asthma  - Allergies, gerd, sinus exacerbators.  - Frequent prednisone -> daily low dose prednisone since FEb 20112  - PFT 11/06/2010 - Mixed obstruction - restriction - fev1 1L/50%, Ratio 54, 24% BD response, TLC 3.6/78%, DLCO 16/75%  - May 2012: Switched asthma care to [name]  - June 2012 PFT - showed normalization with prednisone: PFts 04/18/2011 shows NORMALIZATION (fev1 1.8L/93%, ratio 68, 9% BD response, DLCO 86).  - August 2012: Fev1: 1.1lL/58% -> prednisone burst-> 1.09L/60%, Ratio 65 (no  - Nov 2012: Fev1 0.97L/51%, Ratio 60 -> pred burst  - Dec 2012: Fev1 0.7L/37%, Ratio 67%  - August 2014: FEV1  1.17 L/62% - February 2016: FEV1 1.34/72%, ratio 71  2. Bronchiectasis, (mild RUL) seen on CT chest - April 2012 ( ABPA wu tests on prednisone in June 2012: asp antibody IgE asp fumigatus 0.24 ku/L which is borderline/equivocal, IgG for all all aspergiluus preciptins - NEGATIVE. 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 Allergic rhinitis and allergic asthma.  - 2000s: - per hx strongly skin test positive at Dr Neldon Mc office  - 2004 - trialed xolair - per hx effective but developed rash which she subjectively attributed to twice daily dosing (thogh chart review 2004-2005 states it was stopped due to subjective lack of improvement and an incidental rash resolution was noted in retrospect after stopping xolair)  - 2009: IgE 306 in July 2009. RAST positive for various grass, oak, elm, ragweed, plantain, lamb etc., s/p Allergy shot trial with Dr Annamaria Boots - stopped due to transport issues  - 2012: May - IgE 588.  - 2012: June - moved allergy MD to Dr Hardie Pulley  - 2013: Restart Xolair    4. Lung nodule, 6 mm, on CT scan. - April  2012. Possibly calcified. RUL (no prior for comparison but a 07/17/2000 CT chest reports simillar size nodule in similar location). Repeat CT needed April 2013   5. Gastroesophageal reflux disease with hx of UGI scope showing Hiatal hernia (small)  - delayed gastric empyting 2010 March  6. History of sleep apnea.- non compliant with CPAP   7. Significant anxiety (also mood lability hx with prednisone, worsening anxiety with albuterol)    8. Recurrent AE-ASthma  - Admitted 4/23-4/30/12  - OPD AE asthma Rx: July 2012, august 2012, Nov 2012  - Office treatment with Levaquin and prednisone-November 2013 -  Levaquin and prednisone -January 2014   #Obesity  - 192# - nov 2013. STart low glycemic diet  - 178#  - 03/10/2013. Body mass index is 31.03 kg/(m^2). - 175# - 04/08/13 - 168# - 05/12/2013 with Body mass index is 28.93 kg/(m^2). - 167# on 07/07/2013 with Body mass index is 28.65 kg/(m^2). -  154# on 10/18/2013 wuth Body mass index is 26.42 kg/(m^2).  - Body mass index is 27.79 kg/(m^2). on 07/22/2014    OV 11/21/2014 Chief Complaint  Patient presents with  . Follow-up    Pt stated her breathing is unchanged. Pt c/o DOE, prod cough clear mucus. Pt denies CP/tightness.   Follow-up severe persistent asthma. With question of noncompliance which she denies  At baseline she is on symbicort, xolair, singulair, daily prednisone 2-5mg  a sinus therapy /. Last  visit 07/22/2014 I tried Spiriva but she tells me that this did not help her. She tells me that overall asthma stable although in the last few days she might be a little bit more short of breath. She skin on having a spirometry today. Main issues that she now feels a subjective desire to come off Xolair at least by half dose because of shoulder problems and headaches. She is also keen on gradually reducing and stopping her daily prednisone. Even though she is severe persistent asthma she says she wants to give this a try because she is frustrated  by daily prednisone  Spirometry in the office today: shows improvedment fev1 1.22L/66%. FVC 2.4L/72%, ratio 70   Past, Family, Social reviewed: no change since last visit other than her friend with IPF Lilli Few died and she is upset.     OV 28-Jan-2015   Chief Complaint  Patient presents with  . Follow-up    Pt stated after weaning off her pred she has not noticed a difference. Pt state she is also only doing one  Xolair injection instead of two. Pt c/o mild cough with little mucus production and mild fatigue. Pt denies SOB, CP/tightness.     Follow-up severe persistent asthma with questionable compliance which she denies  At last visit she expresses a keen interest to coming off prednisone. Currently she is coming down on prednisone. She is on a taper which she is taking only 1 mg 3 times a week. In the next 2 weeks she's noticed stop prednisone altogether. She continues with Symbicort and Singulair and she is on Xolair injections at a lower dose. She says she is doing well overall. There are no big issues.  Of note in 2013 she had some subcentimeter very small pulmonary nodules. She is asking for surveillance CT scan for the same  01/30/2015 Follow up : Asthma /Lung nodules  Returns for 1 month follow up .  Spirometry today shows FEV1 60%, ratio 61%, decreased midflows.  Has tapered off steroids  Says last 2 days with spring blooms and drastic change in weather breathing not quite as good.  No wheezing or cough. More nasal drainage.  No discolored mucus, fever, chest pain or edema.  We discussed steroid use for flare -she wants to hold off and see if gets better.  Remains on Xolair (1/2 dose) and symbicort.  Has lung nodules, last CT chest showed stable mult small pulm nodules- no change, consider benign with stability over last 2 years.  Athersclerosis noted in coronaries, she has ov next with week with cardiology .  >>no changes   03/23/2015 Acute OV : Asthma flare  Presents  for an acute office visit.  Complains of wheezing, increased SOB, tightness,  prod cough with yellow mucus x2-3 days.   Denies f/c/s, n/v/d, hemoptysis, chest pain, orthopnea, or edema.  Not taking any otc for cough.  Increased SABA use.    Spirometry today per MR.  Wants to discuss results of CT scan.  Wants samples of Symbicort. Sinus pressure, sinus drainage Review of Systems  Constitutional: Positive for fatigue. Negative for fever and unexpected weight change.  HENT: Negative for congestion, dental problem, ear pain, nosebleeds, postnasal drip, rhinorrhea, sinus pressure, sneezing, sore throat and trouble swallowing.   Eyes: Negative for redness and itching.  Respiratory: Positive for cough. Negative for chest tightness, shortness of breath and wheezing.   Cardiovascular: Negative for palpitations and leg swelling.  Gastrointestinal: Negative for nausea and vomiting.  Genitourinary:  Negative for dysuria.  Musculoskeletal: Negative for joint swelling.  Skin: Negative for rash.  Neurological: Negative for headaches.  Hematological: Does not bruise/bleed easily.  Psychiatric/Behavioral: Negative for dysphoric mood. The patient is not nervous/anxious.        Objective:   Physical Exam  Constitutional: She is oriented to person, place, and time. She appears well-developed and well-nourished. No distress.  HENT:  Head: Normocephalic and atraumatic.  Right Ear: External ear normal.  Left Ear: External ear normal.  Mouth/Throat: Oropharynx is clear and moist. No oropharyngeal exudate.  Eyes: Conjunctivae and EOM are normal. Pupils are equal, round, and reactive to light. Right eye exhibits no discharge. Left eye exhibits no discharge. No scleral icterus.  Neck: Normal range of motion. Neck supple. No JVD present. No tracheal deviation present. No thyromegaly present.  Cardiovascular: Normal rate, regular rhythm, normal heart sounds and intact distal pulses.  Exam reveals no gallop and  no friction rub.   No murmur heard. Pulmonary/Chest: exp wheezing noted. , no stridor  Abdominal: Soft. Bowel sounds are normal. She exhibits no distension and no mass. There is no tenderness. There is no rebound and no guarding.  Musculoskeletal: Normal range of motion. She exhibits no edema or tenderness.  Lymphadenopathy:    She has no cervical adenopathy.  Neurological: She is alert and oriented to person, place, and time. She has normal reflexes. No cranial nerve deficit. She exhibits normal muscle tone. Coordination normal.  Skin: Skin is warm and dry. No rash noted. She is not diaphoretic. No erythema. No pallor.  Psychiatric: She has a normal mood and affect. Her behavior is normal. Judgment and thought content normal.  Vitals reviewed.       Assessment & Plan:

## 2015-03-28 ENCOUNTER — Encounter (HOSPITAL_COMMUNITY): Payer: Medicaid Other

## 2015-03-30 ENCOUNTER — Encounter (HOSPITAL_COMMUNITY): Payer: Medicaid Other

## 2015-03-31 ENCOUNTER — Ambulatory Visit (INDEPENDENT_AMBULATORY_CARE_PROVIDER_SITE_OTHER): Payer: Medicare Other | Admitting: Internal Medicine

## 2015-03-31 ENCOUNTER — Other Ambulatory Visit: Payer: Medicare Other

## 2015-03-31 ENCOUNTER — Encounter: Payer: Self-pay | Admitting: Internal Medicine

## 2015-03-31 VITALS — BP 168/90 | HR 61 | Ht 64.0 in | Wt 162.0 lb

## 2015-03-31 DIAGNOSIS — I2584 Coronary atherosclerosis due to calcified coronary lesion: Secondary | ICD-10-CM | POA: Diagnosis not present

## 2015-03-31 DIAGNOSIS — D721 Eosinophilia, unspecified: Secondary | ICD-10-CM

## 2015-03-31 DIAGNOSIS — I2510 Atherosclerotic heart disease of native coronary artery without angina pectoris: Secondary | ICD-10-CM

## 2015-03-31 DIAGNOSIS — J4551 Severe persistent asthma with (acute) exacerbation: Secondary | ICD-10-CM

## 2015-03-31 DIAGNOSIS — J45901 Unspecified asthma with (acute) exacerbation: Secondary | ICD-10-CM | POA: Diagnosis not present

## 2015-03-31 HISTORY — DX: Eosinophilia, unspecified: D72.10

## 2015-03-31 LAB — NITRIC OXIDE: NITRIC OXIDE: 41

## 2015-03-31 NOTE — Patient Instructions (Addendum)
ICD-9-CM ICD-10-CM   1. Asthma, severe persistent, with acute exacerbation 493.92 J45.51   2. 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      #ASthma severe persistent in exacerbation - reason for exacerbation is running out of symbicort or poor sampole - repeat prednisone   - Take prednisone 40 mg daily x 2 days, then 20mg  daily x 2 days, then 10mg  daily x 2 days, then 5mg  daily x 2 days and stop  Continue current medications; symbicort,and  Singulair, Continue  Xolair dose by half at your request USe pro-air click instead of xopenex prn In future will have to consider NUCALA Keep sinus under control through Dr Constance Holster and saline spray Keep reflux under control with nexium and low glycemic diet IF asthma flares up, call us for prednisone burst or come in    #eosinophilia  - to rule out a rare asthma mimic -do ANA, DS DNA, ANCA screen, PR-3, MPO antibodyu  follow up [name] in 2 months and As needed    = ACQ and FeNO at followup

## 2015-03-31 NOTE — Progress Notes (Signed)
Subjective:    Patient ID: [name] [name], female    DOB: 1941/04/04, 74 y.o.   MRN: [id number]  HPI     1. ASTHMA - Baseline Chronic High Risk (multiple admits), Allergic, Moderate-Severe Persistent Asthma  - Allergies, gerd, sinus exacerbators.  - Frequent prednisone -> daily low dose prednisone since FEb 20112  - PFT 11/06/2010 - Mixed obstruction - restriction - fev1 1L/50%, Ratio 54, 24% BD response, TLC 3.6/78%, DLCO 16/75%  - May 2012: Switched asthma care to [name]  - June 2012 PFT - showed normalization with prednisone: PFts 04/18/2011 shows NORMALIZATION (fev1 1.8L/93%, ratio 68, 9% BD response, DLCO 86).  - August 2012: Fev1: 1.1lL/58% -> prednisone burst-> 1.09L/60%, Ratio 65 (no  - Nov 2012: Fev1 0.97L/51%, Ratio 60 -> pred burst  - Dec 2012: Fev1 0.7L/37%, Ratio 67%  - August 2014: FEV1  1.17 L/62% - February 2016: FEV1 1.34/72%, ratio 71  2. Bronchiectasis, (mild RUL) seen on CT chest - April 2012 ( ABPA wu tests on prednisone in June 2012: asp antibody IgE asp fumigatus 0.24 ku/L which is borderline/equivocal, IgG for all all aspergiluus preciptins - NEGATIVE. 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 Allergic rhinitis and allergic asthma.  - 2000s: - per hx strongly skin test positive at Dr Neldon Mc office  - 2004 - trialed xolair - per hx effective but developed rash which she subjectively attributed to twice daily dosing (thogh chart review 2004-2005 states it was stopped due to subjective lack of improvement and an incidental rash resolution was noted in retrospect after stopping xolair)  - 2009: IgE 306 in July 2009. RAST positive for various grass, oak, elm, ragweed, plantain, lamb etc., s/p Allergy shot trial with Dr Annamaria Boots - stopped due to transport issues  - 2012: May - IgE 588.  - 2012: June - moved allergy MD to Dr Hardie Pulley  - 2013: Restart Xolair  - 2016 (early) - half dose xolair   4.  Lung nodule, 6 mm, on CT scan. - April 2012. Possibly calcified. RUL (no prior for comparison but a 07/17/2000 CT chest reports simillar size nodule in similar location). Repeat CT needed April 2013   5. Gastroesophageal reflux disease with hx of UGI scope showing Hiatal hernia (small)  - delayed gastric empyting 2010 March  6. History of sleep apnea.- non compliant with CPAP   7. Significant anxiety (also mood lability hx with prednisone, worsening anxiety with albuterol)    8. Recurrent AE-ASthma  - Admitted 4/23-4/30/12  - OPD AE asthma Rx: July 2012, august 2012, Nov 2012  - Office treatment with Levaquin and prednisone-November 2013 -  Levaquin and prednisone -January 2014   #Obesity  - 192# - nov 2013. STart low glycemic diet  - 178#  - 03/10/2013. Body mass index is 31.03 kg/(m^2). - 175# - 04/08/13 - 168# - 05/12/2013 with Body mass index is 28.93 kg/(m^2). - 167# on 07/07/2013 with Body mass index is 28.65 kg/(m^2). -  154# on 10/18/2013 wuth Body mass index is 26.42 kg/(m^2).  - Body mass index is 27.79 kg/(m^2). on 07/22/2014 - Body mass index is 27.79 kg/(m^2). - 03/31/15    OV 03/31/2015  Chief Complaint  Patient presents with  . Follow-up    Pt recently seen by TP on 03/23/15 for an acute visit. Pt states she is feeling better but not at baseline. Pt c/o prod cough with light  yellow mucus- pt stated the color is lightening. Pt c/o intermittent chest tightness.     Follow-up severe persistent asthma with question of noncompliance and elevated chronic peripheral eosinophils  She came off chronic prednisone a month ago after we discussed and made this is a mutual plan. She saw my nurse practitioner one week ago and was an exacerbation presumably because of a defective Symbicort sample. Prednisone only helped a little bit but she feels she is back in exacerbation again presumably because of change in weather. She is wheezing more coughing more and has more chest tightness. She  does not want to try Xopenex anymore but instead wants to try the new pro-air click   Exhaled nitric oxide is 41 ppb and is significantly elevated today  REview of labs shows- chronic peripheral eosinophilia - 800cells to 1100 cells/cu.mm betweeen sept 2013 and June 2015   Review of Systems  Constitutional: Negative for fever and unexpected weight change.  HENT: Negative for congestion, dental problem, ear pain, nosebleeds, postnasal drip, rhinorrhea, sinus pressure, sneezing, sore throat and trouble swallowing.   Eyes: Negative for redness and itching.  Respiratory: Positive for cough, chest tightness and shortness of breath. Negative for wheezing.   Cardiovascular: Negative for palpitations and leg swelling.  Gastrointestinal: Negative for nausea and vomiting.  Genitourinary: Negative for dysuria.  Musculoskeletal: Negative for joint swelling.  Skin: Positive for rash.  Neurological: Negative for headaches.  Hematological: Does not bruise/bleed easily.  Psychiatric/Behavioral: Negative for dysphoric mood. The patient is not nervous/anxious.        Objective:   Physical Exam  Filed Vitals:   03/31/15 1329  BP: 168/90  Pulse: 61  Height: 5\' 4"  (1.626 m)  Weight: 162 lb (73.483 kg)  SpO2: 98%    Physical Exam  Constitutional: She is oriented to person, place, and time. She appears well-developed and well-nourished. No distress.  HENT:  Head: Normocephalic and atraumatic.  Right Ear: External ear normal.  Left Ear: External ear normal.  Mouth/Throat: Oropharynx is clear and moist. No oropharyngeal exudate.  Eyes: Conjunctivae and EOM are normal. Pupils are equal, round, and reactive to light. Right eye exhibits no discharge. Left eye exhibits no discharge. No scleral icterus.  Neck: Normal range of motion. Neck supple. No JVD present. No tracheal deviation present. No thyromegaly present.  Cardiovascular: Normal rate, regular rhythm, normal heart sounds and intact distal  pulses.  Exam reveals no gallop and no friction rub.   No murmur heard. Pulmonary/Chest: Effort normal and breath sounds normal. No respiratory distress. She has no wheezes. She has no rales. She exhibits no tenderness.  Abdominal: Soft. Bowel sounds are normal. She exhibits no distension and no mass. There is no tenderness. There is no rebound and no guarding.  Musculoskeletal: Normal range of motion. She exhibits no edema or tenderness.  Dry skin with hyperpig right angle and left thigh - new  Lymphadenopathy:    She has no cervical adenopathy.  Neurological: She is alert and oriented to person, place, and time. She has normal reflexes. No cranial nerve deficit. She exhibits normal muscle tone. Coordination normal.  Skin: Skin is warm and dry. No rash noted. She is not diaphoretic. No erythema. No pallor.  Psychiatric: She has a normal mood and affect. Her behavior is normal. Judgment and thought content normal.  Vitals reviewed.     Assessment & Plan:     ICD-9-CM ICD-10-CM   1. Asthma, severe persistent, with acute exacerbation 493.92 J45.51   2.  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 I am still concerned about her compliance level - repeat prednisone   - Take prednisone 40 mg daily x 2 days, then 20mg  daily x 2 days, then 10mg  daily x 2 days, then 5mg  daily x 2 days and stop  Continue current medications; symbicort,and  Singulair, Continue  Xolair dose by half at your request USe pro-air click instead of xopenex prn In future if symptoms not well controlled will have to consider NUCALA given her recurrent AE-asthma and peripheral eosinophilia and so far modest response to xolair (though poor compliance with MDI prob playing a role) Keep sinus under control through Dr Constance Holster and saline spray Keep reflux under control with nexium and low glycemic diet IF asthma flares up,  call us for prednisone burst or come in    #eosinophilia and sinusitis - chronic established  - to rule out a rare asthma mimic called vasculitis ([place]) -do ANA, DS DNA, ANCA screen, PR-3, MPO antibodyu  follow up [name] in 2 months and As needed    = ACQ and FeNO at followup    Dr. Brand Males, M.D., [facility].C.P Pulmonary and Critical Care Medicine Staff Physician [facility] Pager: [phone], If no answer or between  15:00h - 7:00h: call [phone]  03/31/2015 2:03 PM

## 2015-04-03 ENCOUNTER — Telehealth: Payer: Self-pay | Admitting: Internal Medicine

## 2015-04-03 ENCOUNTER — Ambulatory Visit: Payer: Medicare Other

## 2015-04-03 LAB — ANCA SCREEN W REFLEX TITER
Atypical p-ANCA Screen: NEGATIVE
c-ANCA Screen: POSITIVE — AB
p-ANCA Screen: NEGATIVE

## 2015-04-03 LAB — ANTI-DNA ANTIBODY, DOUBLE-STRANDED: ds DNA Ab: 1 [IU]/mL

## 2015-04-03 LAB — ANA: Anti Nuclear Antibody(ANA): NEGATIVE

## 2015-04-03 LAB — ANCA TITERS

## 2015-04-03 LAB — MPO/PR-3 (ANCA) ANTIBODIES
Myeloperoxidase Abs: <1
Serine Protease 3: <1

## 2015-04-03 MED ORDER — HYDROCODONE-ACETAMINOPHEN 7.5-325 MG PO TABS: 1.0000 | ORAL_TABLET | Freq: Four times a day (QID) | ORAL | Status: DC | PRN

## 2015-04-03 NOTE — Telephone Encounter (Signed)
Pt must repeat UDS before picks up refill rx If pt is negative for hydrocodone, will NOT refill any other opioids because pt had no evidence for hydrocodone on 04/2014 or 01/2015 UDS Note pt with refill #90 every month for MANY consecutive months rx printed and signed - do NOT give refill until UDS repeated THANKS

## 2015-04-03 NOTE — Telephone Encounter (Signed)
Per CY-okay for patient to get her Xolair injection.

## 2015-04-03 NOTE — Telephone Encounter (Signed)
Patient canceled appt, still requesting cb from nurse at previous number listed

## 2015-04-03 NOTE — Telephone Encounter (Signed)
Patient requesting refill for HYDROcodone-acetaminophen ([place]) 7.5-325 MG per tablet [[id number]]

## 2015-04-03 NOTE — Telephone Encounter (Signed)
Pt is aware that she is okay to have Xolair. She will call back and reschedule.

## 2015-04-03 NOTE — Telephone Encounter (Signed)
Spoke with pt. States that she is coughing a lot. Bringing up light yellow mucus. Denies fever. Has Xolair appointment at 11am, wants to know if she still get her injection.  CY - please advise for MR. Thanks.

## 2015-04-04 ENCOUNTER — Ambulatory Visit: Payer: Medicare Other

## 2015-04-04 ENCOUNTER — Telehealth: Payer: Self-pay | Admitting: Internal Medicine

## 2015-04-04 ENCOUNTER — Ambulatory Visit (INDEPENDENT_AMBULATORY_CARE_PROVIDER_SITE_OTHER): Payer: Medicare Other

## 2015-04-04 ENCOUNTER — Encounter (HOSPITAL_COMMUNITY): Payer: Medicaid Other

## 2015-04-04 ENCOUNTER — Other Ambulatory Visit: Payer: Self-pay | Admitting: Internal Medicine

## 2015-04-04 DIAGNOSIS — J4550 Severe persistent asthma, uncomplicated: Secondary | ICD-10-CM

## 2015-04-04 MED ORDER — OMALIZUMAB 150 MG ~~LOC~~ SOLR
150.0000 mg | Freq: Once | SUBCUTANEOUS | Status: AC
  Administered 2015-04-04: 150 mg via SUBCUTANEOUS

## 2015-04-04 MED ORDER — ALBUTEROL SULFATE 108 (90 BASE) MCG/ACT IN AEPB: 1.0000 | INHALATION_SPRAY | Freq: Four times a day (QID) | RESPIRATORY_TRACT | Status: DC | PRN

## 2015-04-04 MED ORDER — PREDNISONE 10 MG PO TABS: ORAL_TABLET | ORAL | Status: DC

## 2015-04-04 NOTE — Telephone Encounter (Signed)
Both rx's have been sent in per MR's OV instructions. Pt is aware. Nothing further was needed.

## 2015-04-06 ENCOUNTER — Telehealth: Payer: Self-pay | Admitting: Internal Medicine

## 2015-04-06 ENCOUNTER — Encounter (HOSPITAL_COMMUNITY)
Admission: RE | Admit: 2015-04-06 | Discharge: 2015-04-06 | Disposition: A | Discharge status: Home / Self Care | Payer: Medicaid Other | Source: Ambulatory Visit | Attending: Pulmonary Disease | Admitting: Pulmonary Disease

## 2015-04-06 NOTE — Telephone Encounter (Signed)
Let Barbee Shropshire know that ANCA screen was strace positive but specific antibopdies negative. So overall normal result for presence of vasculitis. She has eosinophilia but is alwaus< 1100 so I do not think she has vasculitis. AT followup can discuss changnig xolair to nucala the new injection

## 2015-04-07 ENCOUNTER — Telehealth: Payer: Self-pay | Admitting: Internal Medicine

## 2015-04-07 NOTE — Telephone Encounter (Signed)
Called and spoke to pt. Informed her of the results and recs per MR. Pt verbalized understanding and denied any further questions or concerns at this time.   

## 2015-04-07 NOTE — Telephone Encounter (Signed)
#   vials:1 Ordered date:04/05/15  Shipping Date:04/06/15    # Vials:1 Arrival Date:04/06/15 Lot [id number] Exp Date:12/19

## 2015-04-11 ENCOUNTER — Telehealth: Payer: Self-pay | Admitting: Internal Medicine

## 2015-04-11 ENCOUNTER — Encounter (HOSPITAL_COMMUNITY)
Admission: RE | Admit: 2015-04-11 | Discharge: 2015-04-11 | Disposition: A | Discharge status: Home / Self Care | Payer: Medicaid Other | Source: Ambulatory Visit | Attending: Pulmonary Disease | Admitting: Pulmonary Disease

## 2015-04-11 DIAGNOSIS — Z79891 Long term (current) use of opiate analgesic: Secondary | ICD-10-CM | POA: Diagnosis not present

## 2015-04-11 DIAGNOSIS — Z79899 Other long term (current) drug therapy: Secondary | ICD-10-CM | POA: Diagnosis not present

## 2015-04-11 NOTE — Telephone Encounter (Signed)
LVM for pt to call back as soon as possible.   

## 2015-04-11 NOTE — Telephone Encounter (Signed)
Patient has also called regarding this prescription.

## 2015-04-11 NOTE — Telephone Encounter (Signed)
Patient was given a prescription for her HYDROcodone-acetaminophen ([place]) 7.5-325 MG per tablet [[id number]] last week and it was only for 45 pills. She is usually prescribed a quantity of 90. Please call patient. She is wanting to come in to pick up the rest.

## 2015-04-11 NOTE — Telephone Encounter (Signed)
Patient returned your call.

## 2015-04-11 NOTE — Telephone Encounter (Signed)
Spoke to pharmacist and gave okay to fill the #45 rx since 30d supply is #90

## 2015-04-11 NOTE — Telephone Encounter (Signed)
[facility] on [address] has questions about the script for HYDROcodone-acetaminophen ([place]) 7.5-325 MG per tablet [[id number]   [facility] -[phone]

## 2015-04-13 ENCOUNTER — Encounter (HOSPITAL_COMMUNITY): Payer: Medicare Other

## 2015-04-13 DIAGNOSIS — Z5189 Encounter for other specified aftercare: Secondary | ICD-10-CM | POA: Insufficient documentation

## 2015-04-13 DIAGNOSIS — G4730 Sleep apnea, unspecified: Secondary | ICD-10-CM | POA: Insufficient documentation

## 2015-04-13 DIAGNOSIS — M810 Age-related osteoporosis without current pathological fracture: Secondary | ICD-10-CM | POA: Insufficient documentation

## 2015-04-13 DIAGNOSIS — J449 Chronic obstructive pulmonary disease, unspecified: Secondary | ICD-10-CM | POA: Insufficient documentation

## 2015-04-13 DIAGNOSIS — K219 Gastro-esophageal reflux disease without esophagitis: Secondary | ICD-10-CM | POA: Insufficient documentation

## 2015-04-13 DIAGNOSIS — J45909 Unspecified asthma, uncomplicated: Secondary | ICD-10-CM | POA: Insufficient documentation

## 2015-04-18 ENCOUNTER — Encounter (HOSPITAL_COMMUNITY)
Admission: RE | Admit: 2015-04-18 | Discharge: 2015-04-18 | Disposition: A | Discharge status: Home / Self Care | Payer: Self-pay | Source: Ambulatory Visit | Attending: Pulmonary Disease | Admitting: Pulmonary Disease

## 2015-04-19 ENCOUNTER — Encounter: Payer: Self-pay | Admitting: Gastroenterology

## 2015-04-20 ENCOUNTER — Encounter (HOSPITAL_COMMUNITY)
Admission: RE | Admit: 2015-04-20 | Discharge: 2015-04-20 | Disposition: A | Discharge status: Home / Self Care | Payer: Self-pay | Source: Ambulatory Visit | Attending: Pulmonary Disease | Admitting: Pulmonary Disease

## 2015-04-25 ENCOUNTER — Encounter (HOSPITAL_COMMUNITY): Payer: Self-pay

## 2015-04-27 ENCOUNTER — Encounter (HOSPITAL_COMMUNITY): Payer: Self-pay

## 2015-04-27 IMAGING — CT CT CHEST W/O CM
2 of 4 series · 15 of 36 positions shown, 18 images · IV contrast (Omnipaque 300)
Comparison: Chest CT 02/21/2012.

CLINICAL DATA: Subsequent evaluation of a 73-year-old female with
pulmonary nodules. Follow-up examination.

EXAM:
CT CHEST WITHOUT CONTRAST
TECHNIQUE: Multidetector CT imaging of the chest was performed following the
standard protocol without IV contrast..

[Series 2: chest routine with · axial · 0.63mm/px · z∈[-270,-40]mm · 12 of 54 slices shown, 15 images]
[im 4/54  mediastinal]
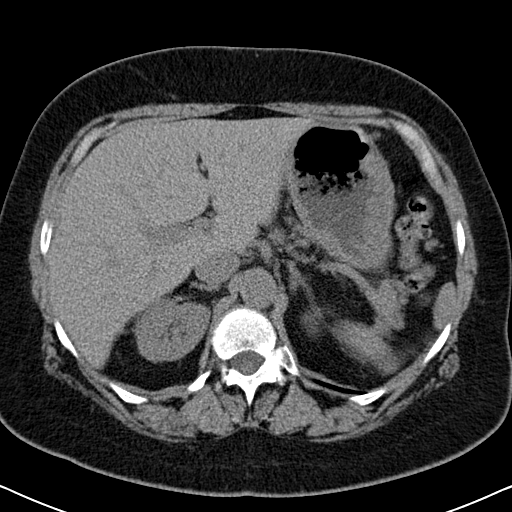
[im 4/54  lung]
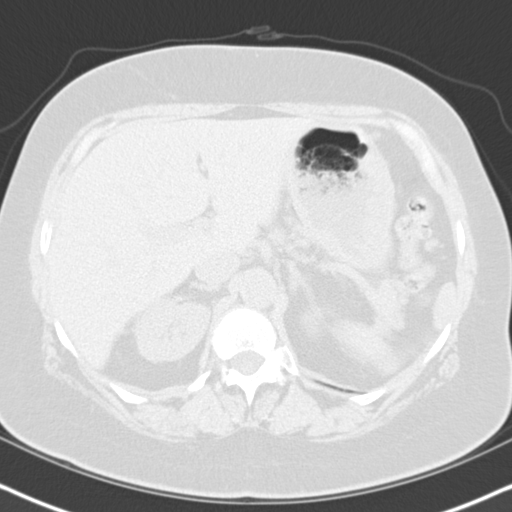
[im 8/54  lung]
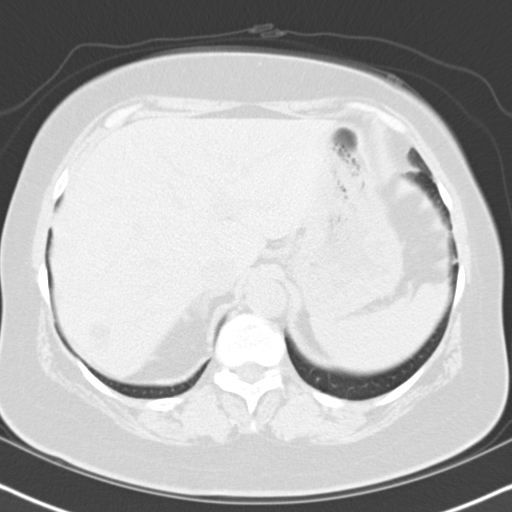
[im 12/54  lung]
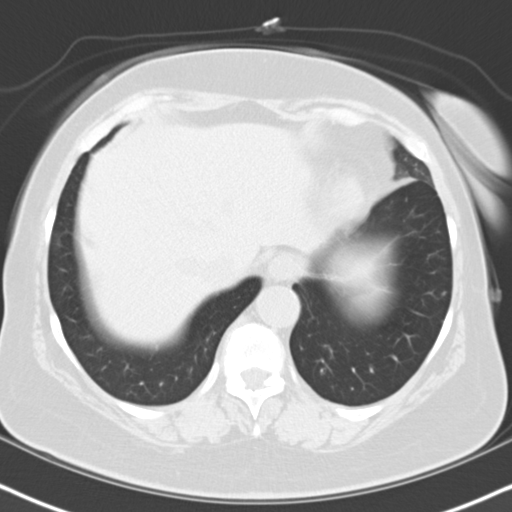
[im 16/54  lung]
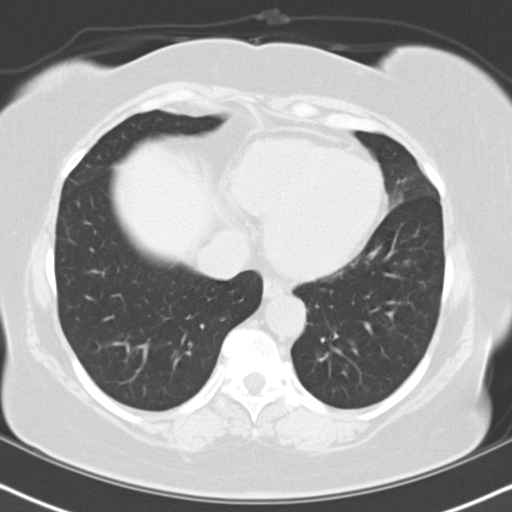
[im 19/54  mediastinal]
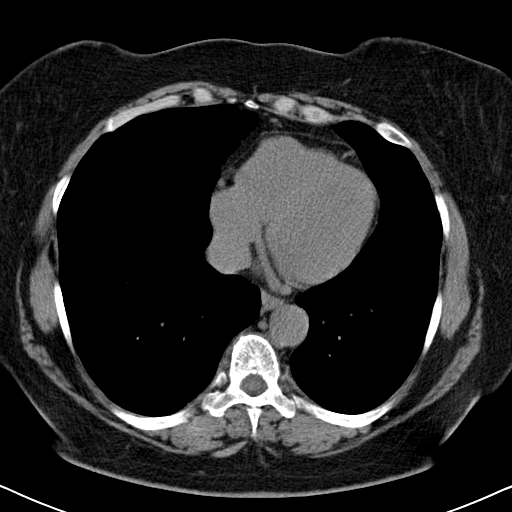
[im 19/54  lung]
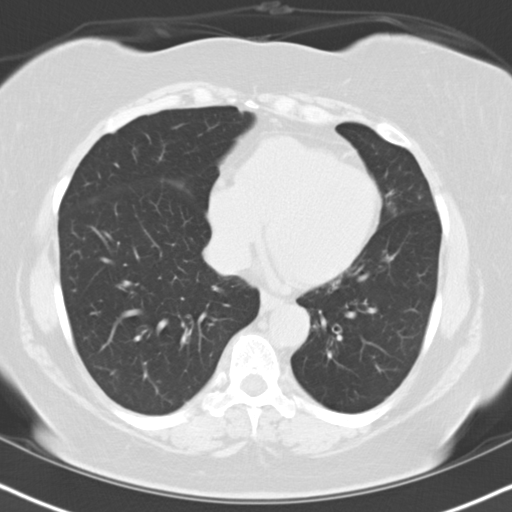
[im 23/54  lung]
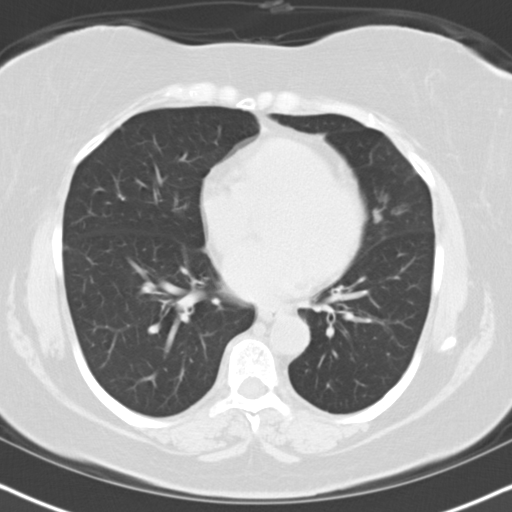
[im 31/54  lung]
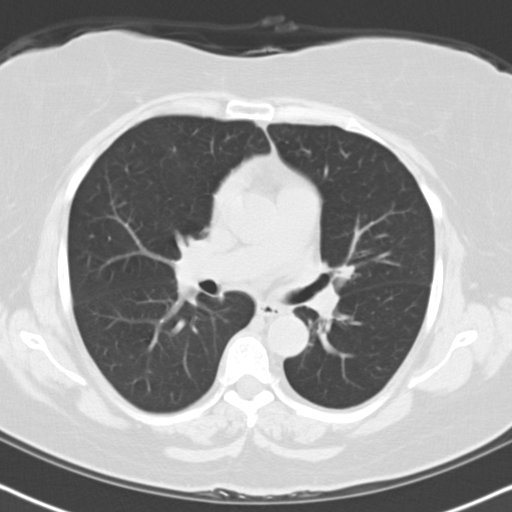
[im 35/54  lung]
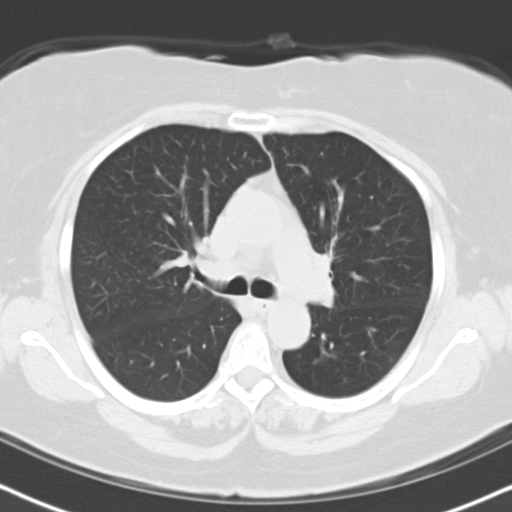
[im 38/54  mediastinal]
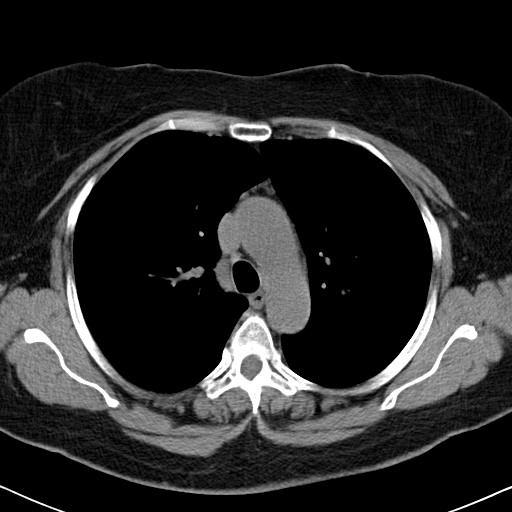
[im 38/54  lung]
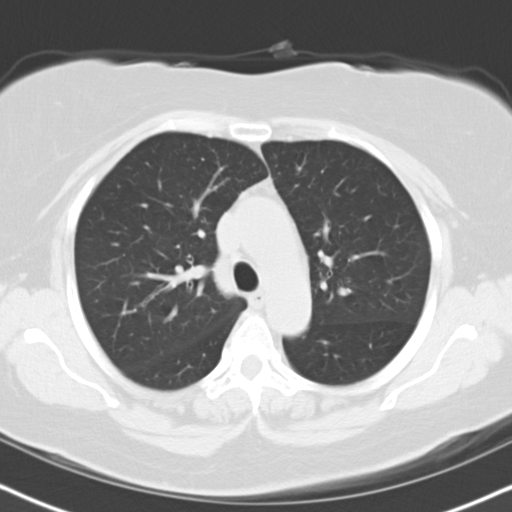
[im 42/54  lung]
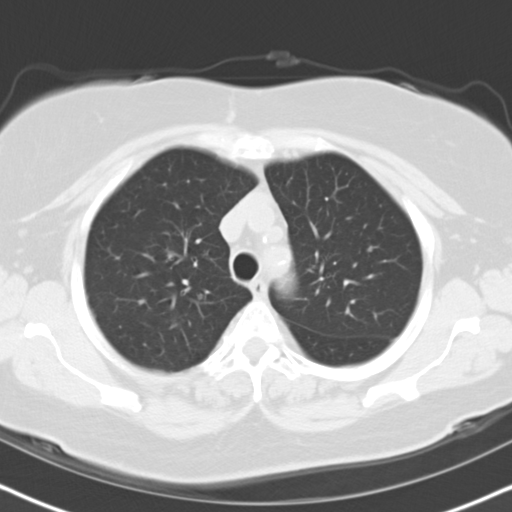
[im 46/54  lung]
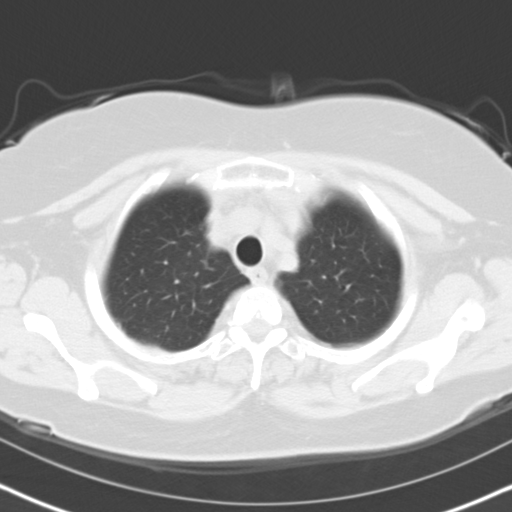
[im 50/54  lung]
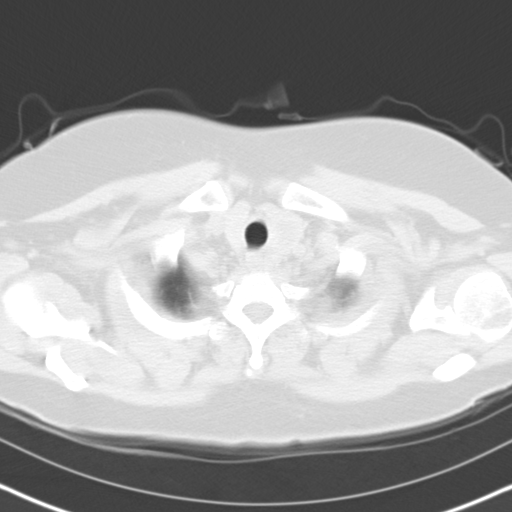

[Series 602: cor · coronal · 0.63mm/px · 3 of 112 slices shown]
[im 23/112  lung]
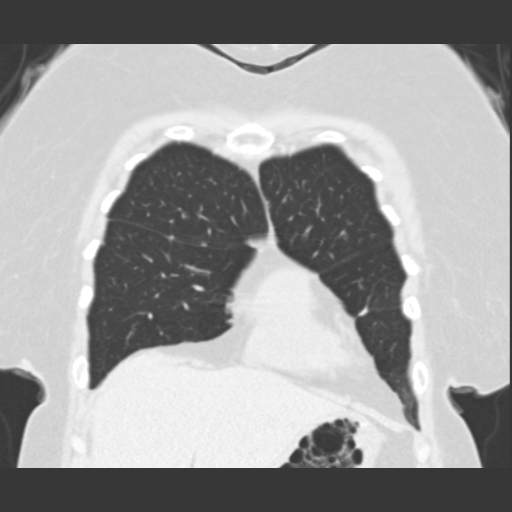
[im 45/112  lung]
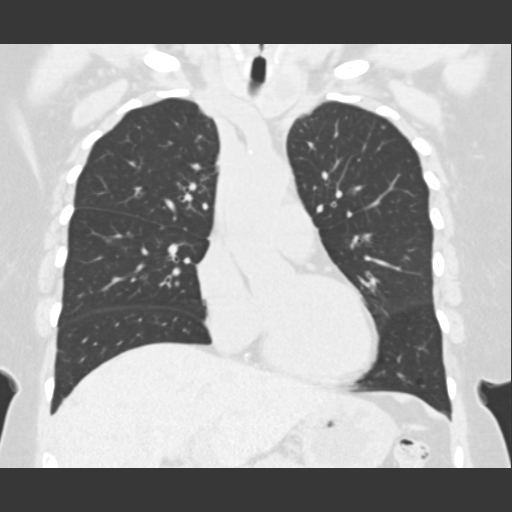
[im 67/112  lung]
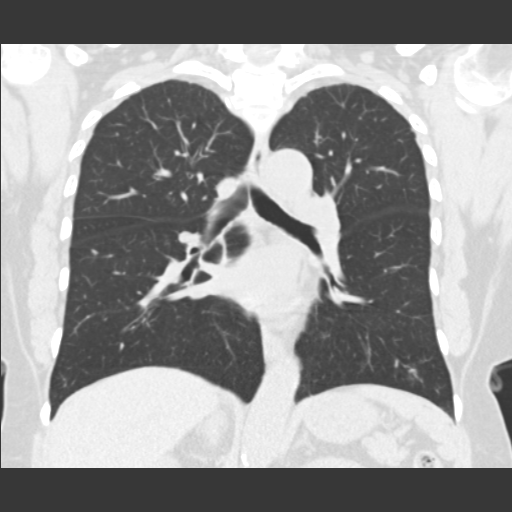

[15 of 36 positions shown; findings below may reference images not displayed]

FINDINGS: Mediastinum/Lymph Nodes: Heart size is normal. There is no
significant pericardial fluid, thickening or pericardial
calcification. There is atherosclerosis of the thoracic aorta, the
great vessels of the mediastinum and the coronary arteries,
including calcified atherosclerotic plaque in the left anterior
descending, left circumflex and right coronary arteries. No
pathologically enlarged mediastinal or hilar lymph nodes. Please
note that accurate exclusion of hilar adenopathy is limited on
noncontrast CT scans. Esophagus is unremarkable in appearance. No
axillary lymphadenopathy.

Lungs/Pleura: Again noted are multiple small pulmonary nodules
scattered throughout the lungs bilaterally, unchanged in size,
number and distribution compared to prior examinations dating back
to 03/05/2011, the largest lesion measuring 6 mm in the anterior
aspect of the right upper lobe (image 11 of series 3). No larger
more suspicious appearing pulmonary nodules or masses are otherwise
noted. No acute consolidative airspace disease. No pleural
effusions. Mild scarring in the inferior segment of the lingula.

Upper Abdomen: Well-defined 2.1 cm low-attenuation lesion in segment
7 of the liver is incompletely characterized, but unchanged compared
to prior examinations, presumably a cyst.

Musculoskeletal/Soft Tissues: There are no aggressive appearing
lytic or blastic lesions noted in the visualized portions of the
skeleton.
IMPRESSION: 1. No change in multiple small pulmonary nodules scattered
throughout the lungs bilaterally, as above. These are considered
benign at this time, and require no future imaging followup.
2. Atherosclerosis, including three vessel coronary artery disease.
Assessment for potential risk factor modification, dietary therapy
or pharmacologic therapy may be warranted, if clinically indicated.
3. Additional incidental findings, as above.

## 2015-05-02 ENCOUNTER — Ambulatory Visit (INDEPENDENT_AMBULATORY_CARE_PROVIDER_SITE_OTHER): Payer: Medicare Other

## 2015-05-02 ENCOUNTER — Encounter (HOSPITAL_COMMUNITY)
Admission: RE | Admit: 2015-05-02 | Discharge: 2015-05-02 | Disposition: A | Discharge status: Home / Self Care | Payer: Self-pay | Source: Ambulatory Visit | Attending: Pulmonary Disease | Admitting: Pulmonary Disease

## 2015-05-02 DIAGNOSIS — J4520 Mild intermittent asthma, uncomplicated: Secondary | ICD-10-CM | POA: Diagnosis not present

## 2015-05-02 DIAGNOSIS — J4550 Severe persistent asthma, uncomplicated: Secondary | ICD-10-CM | POA: Diagnosis not present

## 2015-05-03 MED ORDER — OMALIZUMAB 150 MG ~~LOC~~ SOLR
150.0000 mg | Freq: Once | SUBCUTANEOUS | Status: AC
  Administered 2015-05-02: 150 mg via SUBCUTANEOUS

## 2015-05-04 ENCOUNTER — Telehealth: Payer: Self-pay | Admitting: Internal Medicine

## 2015-05-04 ENCOUNTER — Encounter (HOSPITAL_COMMUNITY)
Admission: RE | Admit: 2015-05-04 | Discharge: 2015-05-04 | Disposition: A | Discharge status: Home / Self Care | Payer: Self-pay | Source: Ambulatory Visit | Attending: Pulmonary Disease | Admitting: Pulmonary Disease

## 2015-05-04 NOTE — Telephone Encounter (Signed)
Is requesting script for hydrocodone.  States quantity is suppose to be 90

## 2015-05-04 NOTE — Telephone Encounter (Signed)
MD back in office Monday and she received 45 pills on 04/24/15 which should last until Monday based on 90 per month.

## 2015-05-04 NOTE — Telephone Encounter (Signed)
MD out of office is this ok refill...Johny Chess

## 2015-05-08 ENCOUNTER — Telehealth: Payer: Self-pay | Admitting: Internal Medicine

## 2015-05-08 ENCOUNTER — Other Ambulatory Visit: Payer: Self-pay

## 2015-05-08 MED ORDER — HYDROCODONE-ACETAMINOPHEN 7.5-325 MG PO TABS: 1.0000 | ORAL_TABLET | Freq: Four times a day (QID) | ORAL | Status: DC | PRN

## 2015-05-08 NOTE — Telephone Encounter (Signed)
Pt informed rx upfront for p/u.

## 2015-05-08 NOTE — Telephone Encounter (Signed)
rx is printed for md to sign

## 2015-05-08 NOTE — Telephone Encounter (Signed)
Patient is calling to ask if script has been fill tyet for   HYDROcodone-acetaminophen (NORCO) 7.5-325 MG per tablet [[id number]]      . Advised at this point not filled. She is requesting it be filled today. Please call patient @ attached # with update. Thank you.

## 2015-05-08 NOTE — Telephone Encounter (Signed)
#   vials:1 Ordered date:05/08/15 Shipping Date:05/09/15

## 2015-05-09 ENCOUNTER — Encounter (HOSPITAL_COMMUNITY)
Admission: RE | Admit: 2015-05-09 | Discharge: 2015-05-09 | Disposition: A | Discharge status: Home / Self Care | Payer: Self-pay | Source: Ambulatory Visit | Attending: Pulmonary Disease | Admitting: Pulmonary Disease

## 2015-05-09 DIAGNOSIS — Z79891 Long term (current) use of opiate analgesic: Secondary | ICD-10-CM | POA: Diagnosis not present

## 2015-05-09 DIAGNOSIS — Z79899 Other long term (current) drug therapy: Secondary | ICD-10-CM | POA: Diagnosis not present

## 2015-05-09 NOTE — Telephone Encounter (Signed)
#   Vials:1 Arrival Date:05/09/2015  Lot #:[id number] Exp Date:1/20

## 2015-05-11 ENCOUNTER — Encounter (HOSPITAL_COMMUNITY): Payer: Self-pay

## 2015-05-16 ENCOUNTER — Encounter (HOSPITAL_COMMUNITY)
Admission: RE | Admit: 2015-05-16 | Discharge: 2015-05-16 | Disposition: A | Discharge status: Home / Self Care | Payer: Medicaid Other | Source: Ambulatory Visit | Attending: Pulmonary Disease | Admitting: Pulmonary Disease

## 2015-05-16 DIAGNOSIS — K219 Gastro-esophageal reflux disease without esophagitis: Secondary | ICD-10-CM | POA: Diagnosis not present

## 2015-05-16 DIAGNOSIS — J449 Chronic obstructive pulmonary disease, unspecified: Secondary | ICD-10-CM | POA: Insufficient documentation

## 2015-05-16 DIAGNOSIS — J45909 Unspecified asthma, uncomplicated: Secondary | ICD-10-CM | POA: Insufficient documentation

## 2015-05-16 DIAGNOSIS — G4730 Sleep apnea, unspecified: Secondary | ICD-10-CM | POA: Diagnosis not present

## 2015-05-16 DIAGNOSIS — Z5189 Encounter for other specified aftercare: Secondary | ICD-10-CM | POA: Diagnosis not present

## 2015-05-16 DIAGNOSIS — M810 Age-related osteoporosis without current pathological fracture: Secondary | ICD-10-CM | POA: Diagnosis not present

## 2015-05-22 ENCOUNTER — Other Ambulatory Visit: Payer: Self-pay

## 2015-05-23 ENCOUNTER — Encounter (HOSPITAL_COMMUNITY)
Admission: RE | Admit: 2015-05-23 | Discharge: 2015-05-23 | Disposition: A | Discharge status: Home / Self Care | Payer: Medicaid Other | Source: Ambulatory Visit | Attending: Pulmonary Disease | Admitting: Pulmonary Disease

## 2015-05-23 ENCOUNTER — Telehealth: Payer: Self-pay

## 2015-05-23 NOTE — Telephone Encounter (Signed)
Pt informed that PCP will not rx the hydrocodone or the alprazolam. Pt understood. Stated that she does not take the med everyday. Some days no pain, some days she needs 2 or 3 tabs and other days 4 or 5 tabs to control pain. Pt has OV coming up in August and will discuss further with PCP. I did mention that the pain clinic may be option.

## 2015-05-23 NOTE — Telephone Encounter (Signed)
Patient returning your call below. Advised you were in a meeting. She will be home all afternoon at the # listed

## 2015-05-23 NOTE — Telephone Encounter (Signed)
LVM for pt to call back as soon as possible.   RE: rx refills for any controlled substance will no longer be prescribed from PCP.

## 2015-05-24 ENCOUNTER — Encounter: Payer: Self-pay | Admitting: Internal Medicine

## 2015-05-25 ENCOUNTER — Encounter (HOSPITAL_COMMUNITY): Payer: Medicaid Other

## 2015-05-30 ENCOUNTER — Ambulatory Visit (INDEPENDENT_AMBULATORY_CARE_PROVIDER_SITE_OTHER): Payer: Medicare Other

## 2015-05-30 ENCOUNTER — Encounter (HOSPITAL_COMMUNITY): Payer: Medicaid Other

## 2015-05-30 DIAGNOSIS — J4520 Mild intermittent asthma, uncomplicated: Secondary | ICD-10-CM

## 2015-05-30 MED ORDER — OMALIZUMAB 150 MG ~~LOC~~ SOLR
150.0000 mg | Freq: Once | SUBCUTANEOUS | Status: AC
  Administered 2015-05-30: 150 mg via SUBCUTANEOUS

## 2015-06-01 ENCOUNTER — Encounter (HOSPITAL_COMMUNITY)
Admission: RE | Admit: 2015-06-01 | Discharge: 2015-06-01 | Disposition: A | Discharge status: Home / Self Care | Payer: Medicaid Other | Source: Ambulatory Visit | Attending: Pulmonary Disease | Admitting: Pulmonary Disease

## 2015-06-05 ENCOUNTER — Telehealth: Payer: Self-pay | Admitting: Internal Medicine

## 2015-06-05 ENCOUNTER — Ambulatory Visit (INDEPENDENT_AMBULATORY_CARE_PROVIDER_SITE_OTHER): Payer: Medicare Other | Admitting: Internal Medicine

## 2015-06-05 ENCOUNTER — Encounter: Payer: Self-pay | Admitting: Internal Medicine

## 2015-06-05 VITALS — BP 164/78 | HR 59 | Ht 64.0 in | Wt 170.0 lb

## 2015-06-05 DIAGNOSIS — D721 Eosinophilia, unspecified: Secondary | ICD-10-CM

## 2015-06-05 DIAGNOSIS — I2584 Coronary atherosclerosis due to calcified coronary lesion: Secondary | ICD-10-CM | POA: Diagnosis not present

## 2015-06-05 DIAGNOSIS — J4540 Moderate persistent asthma, uncomplicated: Secondary | ICD-10-CM

## 2015-06-05 DIAGNOSIS — I2510 Atherosclerotic heart disease of native coronary artery without angina pectoris: Secondary | ICD-10-CM | POA: Diagnosis not present

## 2015-06-05 LAB — NITRIC OXIDE: NITRIC OXIDE: 24

## 2015-06-05 NOTE — Progress Notes (Signed)
Subjective:    Patient ID: [name] [name], female    DOB: 1941/05/31, 74 y.o.   MRN: [id number]  HPI       1. ASTHMA - Baseline Chronic High Risk (multiple admits), Allergic, Moderate-Severe Persistent Asthma  - Allergies, gerd, sinus exacerbators.  - Frequent prednisone -> daily low dose prednisone since FEb 20112  - PFT 11/06/2010 - Mixed obstruction - restriction - fev1 1L/50%, Ratio 54, 24% BD response, TLC 3.6/78%, DLCO 16/75%  - May 2012: Switched asthma care to [name]  - June 2012 PFT - showed normalization with prednisone: PFts 04/18/2011 shows NORMALIZATION (fev1 1.8L/93%, ratio 68, 9% BD response, DLCO 86).  - August 2012: Fev1: 1.1lL/58% -> prednisone burst-> 1.09L/60%, Ratio 65 (no  - Nov 2012: Fev1 0.97L/51%, Ratio 60 -> pred burst  - Dec 2012: Fev1 0.7L/37%, Ratio 67%  - August 2014: FEV1  1.17 L/62% - February 2016: FEV1 1.34/72%, ratio 71  2. Bronchiectasis, (mild RUL) seen on CT chest - April 2012 ( ABPA wu tests on prednisone in June 2012: asp antibody IgE asp fumigatus 0.24 ku/L which is borderline/equivocal, IgG for all all aspergiluus preciptins - NEGATIVE. 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 Allergic rhinitis and allergic asthma.  - 2000s: - per hx strongly skin test positive at Dr Neldon Mc office  - 2004 - trialed xolair - per hx effective but developed rash which she subjectively attributed to twice daily dosing (thogh chart review 2004-2005 states it was stopped due to subjective lack of improvement and an incidental rash resolution was noted in retrospect after stopping xolair)  - 2009: IgE 306 in July 2009. RAST positive for various grass, oak, elm, ragweed, plantain, lamb etc., s/p Allergy shot trial with Dr Annamaria Boots - stopped due to transport issues  - 2012: May - IgE 588.  - 2012: June - moved allergy MD to Dr Hardie Pulley  - 2013: Restart Xolair  - 2016 (early) - half dose  xolair   4. Lung nodule, 6 mm, on CT scan. - April 2012. Possibly calcified. RUL (no prior for comparison but a 07/17/2000 CT chest reports simillar size nodule in similar location). Repeat CT needed April 2013   5. Gastroesophageal reflux disease with hx of UGI scope showing Hiatal hernia (small)  - delayed gastric empyting 2010 March  6. History of sleep apnea.- non compliant with CPAP   7. Significant anxiety (also mood lability hx with prednisone, worsening anxiety with albuterol)    8. Recurrent AE-ASthma  - Admitted 4/23-4/30/12  - OPD AE asthma Rx: July 2012, august 2012, Nov 2012  - Office treatment with Levaquin and prednisone-November 2013 -  Levaquin and prednisone -January 2014   #Obesity  - 192# - nov 2013. STart low glycemic diet  - 178#  - 03/10/2013. Body mass index is 31.03 kg/(m^2). - 175# - 04/08/13 - 168# - 05/12/2013 with Body mass index is 28.93 kg/(m^2). - 167# on 07/07/2013 with Body mass index is 28.65 kg/(m^2). -  154# on 10/18/2013 wuth Body mass index is 26.42 kg/(m^2).  - Body mass index is 27.79 kg/(m^2). on 07/22/2014 - Body mass index is 27.79 kg/(m^2). - 03/31/15    OV 03/31/2015  Chief Complaint  Patient presents with  . Follow-up    Pt recently seen by TP on 03/23/15 for an acute visit. Pt states she is feeling better but not at baseline. Pt c/o prod cough  with light yellow mucus- pt stated the color is lightening. Pt c/o intermittent chest tightness.     Follow-up severe persistent asthma with question of noncompliance and elevated chronic peripheral eosinophils  She came off chronic prednisone a month ago after we discussed and made this is a mutual plan. She saw my nurse practitioner one week ago and was an exacerbation presumably because of a defective Symbicort sample. Prednisone only helped a little bit but she feels she is back in exacerbation again presumably because of change in weather. She is wheezing more coughing more and has more chest  tightness. She does not want to try Xopenex anymore but instead wants to try the new pro-air click   Exhaled nitric oxide is 41 ppb and is significantly elevated today  REview of labs shows- chronic peripheral eosinophilia - 800cells to 1100 cells/cu.mm betweeen sept 2013 and June 2015   OV 06/05/2015  Chief Complaint  Patient presents with  . Follow-up    Pt states she has good days and bad days but has increase in SOB with hot weather. Pt denies significant cough and CP/tightness.    Follow-up moderate to severe asthma  - She is now on daily prednisone. Last time I saw her she was in exacerbation we treated her with prednisone burst. Since then she has significant improvement. Exhaled nitric oxide today's 24 ppb and show significant improvement in asthma. Asthma control questionnaire shows symptoms with an average score of 2.2 which means she still symptomatic in the last 1 week. Specifically she's having asthma symptoms at least a few times during the night and very mild symptoms when she wakes up in the morning and she is moderately limited with her activities because of asthma and a moderate amount of shortness of breath because of asthma and she's wheezing a little of the time and using albuterol inhaler at least 2-4 puffs as needed each day.  Labs  -03/31/2015 ANCA screen was trace positive but specific antibopdies negative. So overall normal result for presence of vasculitis. She has eosinophilia but is alwaus< 1100 so I do not think she has vasculitis. But the last eosinophil count was in 2015 June    - FeNO 06/05/2015 is 24ppb    Past medical history  - She reports frustration with her primary care physician Gwendolyn Grant, MD who apparently discontinued her OP avoid which she was taking when necessary and offered a Xanax which she does not want to do. This is because of a negative urine screen for when necessary. Patient says she's not diverting the drug   Current outpatient  prescriptions:  .  Albuterol Sulfate (PROAIR RESPICLICK) 314 (90 BASE) MCG/ACT AEPB, Inhale 1-2 puffs into the lungs every 6 (six) hours as needed., Disp: 1 each, Rfl: 2 .  amLODipine (NORVASC) 5 MG tablet, Take 1 tablet (5 mg total) by mouth every morning., Disp: 90 tablet, Rfl: 3 .  desloratadine (CLARINEX) 5 MG tablet, Take 1 tablet (5 mg total) by mouth daily., Disp: 90 tablet, Rfl: 3 .  EPINEPHrine (EPIPEN 2-PAK) 0.3 mg/0.3 mL IJ SOAJ injection, Inject 0.3 mLs (0.3 mg total) into the muscle once., Disp: 2 Device, Rfl: 1 .  esomeprazole (NEXIUM) 40 MG capsule, Take 1 capsule (40 mg total) by mouth daily at 12 noon., Disp: 90 capsule, Rfl: 3 .  montelukast (SINGULAIR) 10 MG tablet, Take 1 tablet (10 mg total) by mouth at bedtime., Disp: 90 tablet, Rfl: 3 .  olopatadine (PATANOL) 0.1 % ophthalmic solution, Place  1 drop into both eyes 2 (two) times daily., Disp: 5 mL, Rfl: 3 .  omalizumab (XOLAIR) 150 MG injection, Inject 300 mg into the skin every 28 (twenty-eight) days., Disp: , Rfl:  .  RESTASIS 0.05 % ophthalmic emulsion, PLACE 1 DROP INTO BOTH EYES 2 (TWO) TIMES DAILY., Disp: 60 mL, Rfl: 2 .  SYMBICORT 160-4.5 MCG/ACT inhaler, INHALE 2 PUFFS INTO THE LUNGS 2 (TWO) TIMES DAILY., Disp: 30.6 Inhaler, Rfl: 3 .  Vitamin D, Ergocalciferol, (DRISDOL) 50000 UNITS CAPS capsule, TAKE 1 CAPSULE (50,000 UNITS TOTAL) BY MOUTH EVERY 7 (SEVEN) DAYS., Disp: 12 capsule, Rfl: 3 .  XOPENEX 0.63 MG/3ML nebulizer solution, Take 3 mLs (0.63 mg total) by nebulization every 6 (six) hours as needed for wheezing or shortness of breath. Dx J45.50, Disp: 360 mL, Rfl: 0 .  XOPENEX HFA 45 MCG/ACT inhaler, INHALE 2 PUFFS INTO THE LUNGS EVERY 4 (FOUR) HOURS AS NEEDED. SHORTNESS OF BREATH, Disp: 15 g, Rfl: 2   Review of Systems  Constitutional: Negative for fever and unexpected weight change.  HENT: Negative for congestion, dental problem, ear pain, nosebleeds, postnasal drip, rhinorrhea, sinus pressure, sneezing, sore  throat and trouble swallowing.   Eyes: Negative for redness and itching.  Respiratory: Positive for shortness of breath. Negative for cough, chest tightness and wheezing.   Cardiovascular: Negative for palpitations and leg swelling.  Gastrointestinal: Negative for nausea and vomiting.  Genitourinary: Negative for dysuria.  Musculoskeletal: Negative for joint swelling.  Skin: Negative for rash.  Neurological: Negative for headaches.  Hematological: Does not bruise/bleed easily.  Psychiatric/Behavioral: Negative for dysphoric mood. The patient is not nervous/anxious.        Objective:   Physical Exam  Constitutional: She is oriented to person, place, and time. She appears well-developed and well-nourished. No distress.  HENT:  Head: Normocephalic and atraumatic.  Right Ear: External ear normal.  Left Ear: External ear normal.  Mouth/Throat: Oropharynx is clear and moist. No oropharyngeal exudate.  Eyes: Conjunctivae and EOM are normal. Pupils are equal, round, and reactive to light. Right eye exhibits no discharge. Left eye exhibits no discharge. No scleral icterus.  Neck: Normal range of motion. Neck supple. No JVD present. No tracheal deviation present. No thyromegaly present.  Cardiovascular: Normal rate, regular rhythm, normal heart sounds and intact distal pulses.  Exam reveals no gallop and no friction rub.   No murmur heard. Pulmonary/Chest: Effort normal and breath sounds normal. No respiratory distress. She has no wheezes. She has no rales. She exhibits no tenderness.  Abdominal: Soft. Bowel sounds are normal. She exhibits no distension and no mass. There is no tenderness. There is no rebound and no guarding.  Musculoskeletal: Normal range of motion. She exhibits no edema or tenderness.  Lymphadenopathy:    She has no cervical adenopathy.  Neurological: She is alert and oriented to person, place, and time. She has normal reflexes. No cranial nerve deficit. She exhibits normal  muscle tone. Coordination normal.  Skin: Skin is warm and dry. No rash noted. She is not diaphoretic. No erythema. No pallor.  Psychiatric: She has a normal mood and affect. Her behavior is normal. Judgment and thought content normal.  Vitals reviewed.   Filed Vitals:   06/05/15 1131  BP: 164/78  Pulse: 59  Height: 5\' 4"  (1.626 m)  Weight: 170 lb (77.111 kg)  SpO2: 98%         Assessment & Plan:     ICD-9-CM ICD-10-CM   1. Asthma, moderate persistent, uncomplicated 546.50 P54.65  CBC w/Diff  2. 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 NUCALA can be effective in reducing ae=asthma frequency   PLAN Do CBC with Diff - to look at current level of eosinophilia Continue current medications; symbicort,and  Singulair, Continue  Xolair dose by half at your request but we will start paper work for [employer]  - once on NUCALA will stop Lexicographer (will need high eos to start nucala) USe pro-air click instead of xopenex prn Keep sinus under control through Dr Constance Holster and saline spray Keep reflux under control with nexium and low glycemic diet IF asthma flares up, call us for prednisone burst or come in    follow up [name] in 3 months and As needed    = ACQ and FeNO at followup   Dr. Brand Males, M.D., [facility].C.P Pulmonary and Critical Care Medicine Staff Physician [place] [facility] Pager: [phone], If no answer or between  15:00h - 7:00h: call [phone]  06/05/2015 12:10 PM

## 2015-06-05 NOTE — Telephone Encounter (Signed)
Called pt and she is requesting to speak with Chantel. She reports chan advised her to call here if she can't get something straightened with the billing dept. Please advise thanks

## 2015-06-05 NOTE — Patient Instructions (Addendum)
    ICD-9-CM ICD-10-CM   1. Asthma, moderate persistent, uncomplicated 683.72 B02.11   2. 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 in 3 months and As needed    = ACQ and FeNO at followup

## 2015-06-06 ENCOUNTER — Encounter (HOSPITAL_COMMUNITY): Payer: Medicaid Other

## 2015-06-06 NOTE — Telephone Encounter (Signed)
Please let the patient know it will be Thursday before I can call her back I have not been made aware of a issue

## 2015-06-06 NOTE — Telephone Encounter (Signed)
lmomtcb x1 to make pt aware 

## 2015-06-07 ENCOUNTER — Telehealth: Payer: Self-pay | Admitting: Internal Medicine

## 2015-06-07 ENCOUNTER — Encounter: Payer: Self-pay | Admitting: Internal Medicine

## 2015-06-07 NOTE — Telephone Encounter (Signed)
Pt called in this morning needing a letter from MR. Letter needs to state all of her medical conditions.  MR - please advise if you can write this letter. Thanks.

## 2015-06-07 NOTE — Telephone Encounter (Signed)
Ina Kick did not do CBC with diff so I can see if she is  NUCALA eligible. THis wwas 06/05/15 Thanks  Dr. Brand Males, M.D., F.C.C.P Pulmonary and Critical Care Medicine Staff Physician [place] [facility] Pager: [phone], If no answer or between  15:00h - 7:00h: call [phone]  06/07/2015 9:08 AM

## 2015-06-07 NOTE — Telephone Encounter (Signed)
Spoke with pt. States that she spoke with her insurance company. They told her that the wrong code was used for her Xolair injections for the months of May and June. She will await Chan's call back.

## 2015-06-08 ENCOUNTER — Encounter (HOSPITAL_COMMUNITY)
Admission: RE | Admit: 2015-06-08 | Discharge: 2015-06-08 | Disposition: A | Discharge status: Home / Self Care | Payer: Medicaid Other | Source: Ambulatory Visit | Attending: Pulmonary Disease | Admitting: Pulmonary Disease

## 2015-06-08 NOTE — Telephone Encounter (Signed)
lmtcb for pt to inform her she needs to have labs drawn.

## 2015-06-08 NOTE — Telephone Encounter (Signed)
Pt states that she is aware of the labs that are needing to be done - patient states that she was unable to get the labs the day of her appt bc the lab was very crowded and her ride needed to leave.  Pt states that she can get someone to bring her next week - advised that would work, just need to make sure to get this done because if it is not done then she will not be able to be enrolled in the [place] program because we will not know if she is eligible.   Patient is also requesting a letter explaining her medical conditions. Pt states that she is trying to get into an exercise program at [facility] and they are aware that she is in the [facility] the Hospital and are hesitant on letting her enroll in this class. They are requesting a letter from MR stating her medical conditions and basically releasing her to attend the classes outside of her Pulm Rehab.   Please advise Dr Chase Caller. Thanks.

## 2015-06-08 NOTE — Telephone Encounter (Signed)
[phone], pt cb

## 2015-06-09 NOTE — Telephone Encounter (Signed)
lmtcb

## 2015-06-09 NOTE — Telephone Encounter (Signed)
Her problems are  Patient Active Problem List   Diagnosis Date Noted  . Asthma, moderate persistent 06/05/2015  . Eosinophilia 03/31/2015  . Sinus pain 02/09/2015  . Dyspnea 01/02/2015  . Lung nodules 01/02/2015  . Medicare annual wellness visit, subsequent 10/28/2014  . Disorder of left rotator cuff 10/24/2014  . Benign neoplasm of colon 03/29/2014  . GERD (gastroesophageal reflux disease) 03/11/2014  . Unspecified vitamin D deficiency 09/16/2013  . Pain management 05/22/2013  . Coronary artery calcification 04/10/2013  . 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 Routine health maintenance 07/26/2012  . Obesity (BMI 30.0-34.9) 04/12/2012  . Bradycardia 02/18/2012  . Leg cramps, sleep related 11/25/2011  . Chronic renal insufficiency, stage II (mild) 10/30/2011  . Hypertension 10/25/2011  . Gastroparesis 07/22/2011  . Asthma, severe persistent 05/05/2011  . Pulmonary nodule, right 05/05/2011  . ALLERGY, FOOD 07/10/2010  . Anxiety 06/18/2010  . GASTRITIS 05/29/2009  . COLONIC POLYPS, HYPERPLASTIC, HX OF 04/13/2008  . OSA (obstructive sleep apnea) 03/06/2008  . Allergic rhinitis 02/22/2008  . Other and unspecified hyperlipidemia 01/08/2008  . GERD 05/30/2007  . OSTEOPOROSIS 05/30/2007   She can attend exercise classes outside pulmonary rehab at her own risk which is fine as long as she knows her limits  Dr. Brand Males, M.D., [facility].C.P Pulmonary and Critical Care Medicine Staff Physician [place] [facility] Pager: [phone], If no answer or between  15:00h - 7:00h: call [phone]  06/09/2015 11:55 AM

## 2015-06-12 NOTE — Telephone Encounter (Signed)
lmtcb X2 for pt.  

## 2015-06-13 ENCOUNTER — Encounter (HOSPITAL_COMMUNITY)
Admission: RE | Admit: 2015-06-13 | Discharge: 2015-06-13 | Disposition: A | Discharge status: Home / Self Care | Payer: Self-pay | Source: Ambulatory Visit | Attending: Pulmonary Disease | Admitting: Pulmonary Disease

## 2015-06-13 ENCOUNTER — Encounter: Payer: Self-pay | Admitting: *Deleted

## 2015-06-13 DIAGNOSIS — G4730 Sleep apnea, unspecified: Secondary | ICD-10-CM | POA: Insufficient documentation

## 2015-06-13 DIAGNOSIS — J449 Chronic obstructive pulmonary disease, unspecified: Secondary | ICD-10-CM | POA: Insufficient documentation

## 2015-06-13 DIAGNOSIS — Z5189 Encounter for other specified aftercare: Secondary | ICD-10-CM | POA: Insufficient documentation

## 2015-06-13 DIAGNOSIS — M810 Age-related osteoporosis without current pathological fracture: Secondary | ICD-10-CM | POA: Insufficient documentation

## 2015-06-13 DIAGNOSIS — K219 Gastro-esophageal reflux disease without esophagitis: Secondary | ICD-10-CM | POA: Insufficient documentation

## 2015-06-13 DIAGNOSIS — J45909 Unspecified asthma, uncomplicated: Secondary | ICD-10-CM | POA: Insufficient documentation

## 2015-06-13 NOTE — Telephone Encounter (Signed)
Spoke with the pt She is going to come in Thurs 06/15/15 for labs and will pick up the letter then  It is up front  Nothing further needed

## 2015-06-13 NOTE — Telephone Encounter (Signed)
LMCTBx 3 for pt -- regarding letter Patient needs to come get labs drawn as well - cannot enroll in [place] program without these.

## 2015-06-14 ENCOUNTER — Telehealth: Payer: Self-pay | Admitting: Internal Medicine

## 2015-06-14 NOTE — Telephone Encounter (Signed)
#   vials:1 Ordered date:06/14/15 Shipping Date:06/15/15

## 2015-06-14 NOTE — Telephone Encounter (Signed)
lmam to make the pt aware the charge will be sent to corrections and fixed

## 2015-06-15 ENCOUNTER — Other Ambulatory Visit (INDEPENDENT_AMBULATORY_CARE_PROVIDER_SITE_OTHER): Payer: Medicare Other

## 2015-06-15 ENCOUNTER — Encounter (HOSPITAL_COMMUNITY)
Admission: RE | Admit: 2015-06-15 | Discharge: 2015-06-15 | Disposition: A | Discharge status: Home / Self Care | Payer: Self-pay | Source: Ambulatory Visit | Attending: Pulmonary Disease | Admitting: Pulmonary Disease

## 2015-06-15 DIAGNOSIS — D721 Eosinophilia, unspecified: Secondary | ICD-10-CM

## 2015-06-15 DIAGNOSIS — J4540 Moderate persistent asthma, uncomplicated: Secondary | ICD-10-CM | POA: Diagnosis not present

## 2015-06-15 LAB — CBC WITH DIFFERENTIAL/PLATELET
BASOS ABS: 0.1 10*3/uL (ref 0.0–0.1)
BASOS PCT: 0.7 % (ref 0.0–3.0)
Eosinophils Absolute: 0.7 10*3/uL (ref 0.0–0.7)
Eosinophils Relative: 8.9 % — ABNORMAL HIGH (ref 0.0–5.0)
HCT: 37 % (ref 36.0–46.0)
HEMOGLOBIN: 11.9 g/dL — AB (ref 12.0–15.0)
LYMPHS ABS: 2.2 10*3/uL (ref 0.7–4.0)
Lymphocytes Relative: 28.6 % (ref 12.0–46.0)
MCHC: 32.3 g/dL (ref 30.0–36.0)
MCV: 87.2 fl (ref 78.0–100.0)
MONOS PCT: 8.3 % (ref 3.0–12.0)
Monocytes Absolute: 0.6 10*3/uL (ref 0.1–1.0)
NEUTROS PCT: 53.5 % (ref 43.0–77.0)
Neutro Abs: 4.1 10*3/uL (ref 1.4–7.7)
PLATELETS: 237 10*3/uL (ref 150.0–400.0)
RBC: 4.24 Mil/uL (ref 3.87–5.11)
RDW: 14.5 % (ref 11.5–15.5)
WBC: 7.7 10*3/uL (ref 4.0–10.5)

## 2015-06-15 NOTE — Telephone Encounter (Signed)
#   Vials:1 Arrival Date:06/15/15 Lot #:[id number] Exp Date:2/20

## 2015-06-18 ENCOUNTER — Telehealth: Payer: Self-pay | Admitting: Internal Medicine

## 2015-06-18 NOTE — Telephone Encounter (Signed)
Eos for [name] [name] are high. INititate NUCALA paperwork. She will hve to come off Xolair once Nucala approved

## 2015-06-19 NOTE — Telephone Encounter (Signed)
Called and spoke to pt. Informed pt of the results and recs per MR. Pt stated she has decided against the Nucala injection after reading more about it. Pt stated she would like to discuss this with MR. Pt stated she when she sees MR at pulmonary rehab she will discuss this then.   Will send to MR as FYI.

## 2015-06-20 ENCOUNTER — Encounter (HOSPITAL_COMMUNITY): Payer: Self-pay

## 2015-06-20 ENCOUNTER — Ambulatory Visit (INDEPENDENT_AMBULATORY_CARE_PROVIDER_SITE_OTHER): Payer: Medicare Other | Admitting: Internal Medicine

## 2015-06-20 ENCOUNTER — Ambulatory Visit: Payer: Medicare Other | Admitting: Internal Medicine

## 2015-06-20 ENCOUNTER — Other Ambulatory Visit (INDEPENDENT_AMBULATORY_CARE_PROVIDER_SITE_OTHER): Payer: Medicare Other

## 2015-06-20 ENCOUNTER — Encounter: Payer: Self-pay | Admitting: Internal Medicine

## 2015-06-20 VITALS — BP 160/78 | HR 69 | Temp 98.8°F | Resp 18 | Wt 171.0 lb

## 2015-06-20 DIAGNOSIS — K219 Gastro-esophageal reflux disease without esophagitis: Secondary | ICD-10-CM

## 2015-06-20 DIAGNOSIS — I2510 Atherosclerotic heart disease of native coronary artery without angina pectoris: Secondary | ICD-10-CM

## 2015-06-20 DIAGNOSIS — I2584 Coronary atherosclerosis due to calcified coronary lesion: Secondary | ICD-10-CM | POA: Diagnosis not present

## 2015-06-20 DIAGNOSIS — M170 Bilateral primary osteoarthritis of knee: Secondary | ICD-10-CM

## 2015-06-20 DIAGNOSIS — I10 Essential (primary) hypertension: Secondary | ICD-10-CM | POA: Diagnosis not present

## 2015-06-20 LAB — BASIC METABOLIC PANEL
BUN: 17 mg/dL (ref 6–23)
CO2: 30 mEq/L (ref 19–32)
Calcium: 9.5 mg/dL (ref 8.4–10.5)
Chloride: 104 mEq/L (ref 96–112)
Creatinine, Ser: 1.11 mg/dL (ref 0.40–1.20)
GFR: 62.08 mL/min (ref >60.00)
Glucose, Bld: 87 mg/dL (ref 70–99)
Potassium: 4.2 mEq/L (ref 3.5–5.1)
Sodium: 137 mEq/L (ref 135–145)

## 2015-06-20 MED ORDER — TRAMADOL HCL 50 MG PO TABS: 50.0000 mg | ORAL_TABLET | Freq: Three times a day (TID) | ORAL | Status: DC | PRN

## 2015-06-20 NOTE — Progress Notes (Signed)
Pre visit review using our clinic review tool, if applicable. No additional management support is needed unless otherwise documented below in the visit note. 

## 2015-06-20 NOTE — Patient Instructions (Signed)
Use an anti-inflammatory cream such as Aspercreme or Zostrix cream twice a day to the knees as needed. In lieu of this warm moist compresses or  hot water bottle can be used. Do not apply ice .  Reflux of gastric acid may be asymptomatic as this may occur mainly during sleep.The triggers for reflux  include stress; the "aspirin family" ; alcohol; peppermint; and caffeine (coffee, tea, cola, and chocolate). The aspirin family would include aspirin and the nonsteroidal agents such as ibuprofen &  Naproxen. Tylenol would not cause reflux. If having symptoms ; food & drink should be avoided for @ least 2 hours before going to bed.    Minimal Blood Pressure Goal= AVERAGE < 140/90;  Ideal is an AVERAGE < 135/85. This AVERAGE should be calculated from @ least 5-7 BP readings taken @ different times of day on different days of week. You should not respond to isolated BP readings , but rather the AVERAGE for that week .Please bring your  blood pressure cuff to office visits to verify that it is reliable.It  can also be checked against the blood pressure device at the pharmacy. Finger or wrist cuffs are not dependable; an arm cuff is.

## 2015-06-20 NOTE — Progress Notes (Signed)
   Subjective:    Patient ID: [name] [name], female    DOB: November 23, 1940, 74 y.o.   MRN: [id number]  HPI The patient is here to assess status of active health conditions.  PMH, FH, & Social History reviewed & updated.No change in [place] as recorded.   She states she is on low glycemic diet. She is unable to exercise because of her pulmonary disease.She is compliant with her blood pressure medications without adverse effects. Blood pressure at home ranges 130-140/72 on average. Renal function was normal in February of this year.  She has been prescribed CPAP but is not using it. She states that her stress incontinence interferes with her ability to employ the CPAP.  The severity of her asthma varies and is associated with occasional nocturnal dyspnea. She is being actively followed by a Pulmonary specialist  She is on the PPI on a regular basis .She denies active GI symptoms.   Review of Systems   She states she previously was on Xanax but could not tolerate this. She states it caused her to "be out of it". She also was on pain medicine for her degenerative joint disease of the knees. This was discontinued as it did not show up in her urine. She had been seen by the orthopedist; he wanted to perform cortisone shots. She was hesitant to do that because she has taken so much steroids for asthma.  She also has found over-the-counter pain medicines have a negative impact on her asthma.  Chest pain, palpitations, tachycardia,  claudication or edema are absent.  Unexplained weight loss, abdominal pain, significant dyspepsia, dysphagia, melena, rectal bleeding, or persistently small caliber stools are denied.       Objective:   Physical Exam  Pertinent or positive findings include: She has an upper plate and lower partial. Breath sounds are generally decreased. Abdomen is protuberant. Striae are present. She has crepitus of the knees without associated effusion.  General appearance :adequately  nourished; in no distress.  Eyes: No conjunctival inflammation or scleral icterus is present.  Oral exam:  Lips and gums are healthy appearing.There is no oropharyngeal erythema or exudate noted. Dental hygiene is good.  Heart:  Normal rate and regular rhythm. S1 and S2 normal without gallop, murmur, click, rub or other extra sounds    Lungs:Chest clear to auscultation; no wheezes, rhonchi,rales ,or rubs present.No increased work of breathing.   Abdomen: bowel sounds normal, soft and non-tender without masses, organomegaly or hernias noted.  No guarding or rebound.   Vascular : all pulses equal ; no bruits present.  Skin:Warm & dry.  Intact without suspicious lesions or rashes ; no tenting    Lymphatic: No lymphadenopathy is noted about the head, neck, axilla  Neuro: Strength, tone & DTRs normal.         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

## 2015-06-21 ENCOUNTER — Other Ambulatory Visit: Payer: Self-pay | Admitting: Internal Medicine

## 2015-06-22 ENCOUNTER — Encounter (HOSPITAL_COMMUNITY)
Admission: RE | Admit: 2015-06-22 | Discharge: 2015-06-22 | Disposition: A | Discharge status: Home / Self Care | Payer: Self-pay | Source: Ambulatory Visit | Attending: Pulmonary Disease | Admitting: Pulmonary Disease

## 2015-06-26 ENCOUNTER — Ambulatory Visit: Payer: Medicare Other | Admitting: Internal Medicine

## 2015-06-27 ENCOUNTER — Encounter (HOSPITAL_COMMUNITY): Payer: Self-pay

## 2015-06-27 ENCOUNTER — Ambulatory Visit: Payer: Medicare Other

## 2015-06-29 ENCOUNTER — Encounter (HOSPITAL_COMMUNITY)
Admission: RE | Admit: 2015-06-29 | Discharge: 2015-06-29 | Disposition: A | Discharge status: Home / Self Care | Payer: Self-pay | Source: Ambulatory Visit | Attending: Pulmonary Disease | Admitting: Pulmonary Disease

## 2015-07-04 ENCOUNTER — Encounter (HOSPITAL_COMMUNITY)
Admission: RE | Admit: 2015-07-04 | Discharge: 2015-07-04 | Disposition: A | Discharge status: Home / Self Care | Payer: Self-pay | Source: Ambulatory Visit | Attending: Pulmonary Disease | Admitting: Pulmonary Disease

## 2015-07-05 ENCOUNTER — Ambulatory Visit (INDEPENDENT_AMBULATORY_CARE_PROVIDER_SITE_OTHER): Payer: Medicare Other

## 2015-07-05 ENCOUNTER — Ambulatory Visit: Payer: Medicare Other

## 2015-07-05 DIAGNOSIS — J4540 Moderate persistent asthma, uncomplicated: Secondary | ICD-10-CM

## 2015-07-05 MED ORDER — OMALIZUMAB 150 MG ~~LOC~~ SOLR
150.0000 mg | Freq: Once | SUBCUTANEOUS | Status: AC
  Administered 2015-07-05: 150 mg via SUBCUTANEOUS

## 2015-07-06 ENCOUNTER — Encounter (HOSPITAL_COMMUNITY)
Admission: RE | Admit: 2015-07-06 | Discharge: 2015-07-06 | Disposition: A | Discharge status: Home / Self Care | Payer: Self-pay | Source: Ambulatory Visit | Attending: Pulmonary Disease | Admitting: Pulmonary Disease

## 2015-07-11 ENCOUNTER — Encounter (HOSPITAL_COMMUNITY): Payer: Self-pay

## 2015-07-13 ENCOUNTER — Encounter (HOSPITAL_COMMUNITY)
Admission: RE | Admit: 2015-07-13 | Discharge: 2015-07-13 | Disposition: A | Discharge status: Home / Self Care | Payer: Self-pay | Source: Ambulatory Visit | Attending: Pulmonary Disease | Admitting: Pulmonary Disease

## 2015-07-13 ENCOUNTER — Telehealth: Payer: Self-pay | Admitting: Internal Medicine

## 2015-07-13 DIAGNOSIS — K219 Gastro-esophageal reflux disease without esophagitis: Secondary | ICD-10-CM | POA: Insufficient documentation

## 2015-07-13 DIAGNOSIS — J45909 Unspecified asthma, uncomplicated: Secondary | ICD-10-CM | POA: Insufficient documentation

## 2015-07-13 DIAGNOSIS — J449 Chronic obstructive pulmonary disease, unspecified: Secondary | ICD-10-CM | POA: Insufficient documentation

## 2015-07-13 DIAGNOSIS — Z5189 Encounter for other specified aftercare: Secondary | ICD-10-CM | POA: Insufficient documentation

## 2015-07-13 DIAGNOSIS — G4730 Sleep apnea, unspecified: Secondary | ICD-10-CM | POA: Insufficient documentation

## 2015-07-13 DIAGNOSIS — M810 Age-related osteoporosis without current pathological fracture: Secondary | ICD-10-CM | POA: Insufficient documentation

## 2015-07-13 NOTE — Telephone Encounter (Signed)
Pt called requesting refill for desloratadine (CLARINEX) 5 MG tablet [[id number] Pharmacy is [facility] on [address].

## 2015-07-14 MED ORDER — DESLORATADINE 5 MG PO TABS: 5.0000 mg | ORAL_TABLET | Freq: Every day | ORAL | Status: DC

## 2015-07-14 NOTE — Telephone Encounter (Signed)
erx done

## 2015-07-18 ENCOUNTER — Encounter (HOSPITAL_COMMUNITY): Payer: Self-pay

## 2015-07-20 ENCOUNTER — Encounter (HOSPITAL_COMMUNITY)
Admission: RE | Admit: 2015-07-20 | Discharge: 2015-07-20 | Disposition: A | Discharge status: Home / Self Care | Payer: Self-pay | Source: Ambulatory Visit | Attending: Pulmonary Disease | Admitting: Pulmonary Disease

## 2015-07-25 ENCOUNTER — Encounter (HOSPITAL_COMMUNITY): Payer: Self-pay

## 2015-07-27 ENCOUNTER — Encounter (HOSPITAL_COMMUNITY)
Admission: RE | Admit: 2015-07-27 | Discharge: 2015-07-27 | Disposition: A | Discharge status: Home / Self Care | Payer: Self-pay | Source: Ambulatory Visit | Attending: Pulmonary Disease | Admitting: Pulmonary Disease

## 2015-07-27 ENCOUNTER — Telehealth: Payer: Self-pay | Admitting: Internal Medicine

## 2015-07-28 ENCOUNTER — Other Ambulatory Visit: Payer: Self-pay | Admitting: Internal Medicine

## 2015-07-28 NOTE — Telephone Encounter (Signed)
#   Vials:1 Arrival Date:07-28-15 Lot #:[id number] [id number]

## 2015-07-28 NOTE — Telephone Encounter (Signed)
#   vials:1 Ordered date:07-27-15 Shipping Date:07-28-15

## 2015-08-01 ENCOUNTER — Encounter (HOSPITAL_COMMUNITY)
Admission: RE | Admit: 2015-08-01 | Discharge: 2015-08-01 | Disposition: A | Discharge status: Home / Self Care | Payer: Medicare Other | Source: Ambulatory Visit | Attending: Pulmonary Disease | Admitting: Pulmonary Disease

## 2015-08-01 ENCOUNTER — Telehealth: Payer: Self-pay | Admitting: Internal Medicine

## 2015-08-01 NOTE — Telephone Encounter (Signed)
[name] seen her last and prescribed her traMADol (ULTRAM) 50 MG tablet [[id number] It worked well for her and she is needing a refill.  She would like more quantity on it so it would last at least a month. Please call her at [phone]

## 2015-08-02 ENCOUNTER — Other Ambulatory Visit: Payer: Self-pay | Admitting: Emergency Medicine

## 2015-08-02 ENCOUNTER — Ambulatory Visit: Payer: Medicare Other

## 2015-08-02 DIAGNOSIS — M170 Bilateral primary osteoarthritis of knee: Secondary | ICD-10-CM

## 2015-08-02 MED ORDER — TRAMADOL HCL 50 MG PO TABS: 50.0000 mg | ORAL_TABLET | Freq: Three times a day (TID) | ORAL | Status: DC | PRN

## 2015-08-02 NOTE — Telephone Encounter (Signed)
Called pt no answer LMOM with md response. rx has been faxed to [facility].../lmb

## 2015-08-02 NOTE — Telephone Encounter (Signed)
Tramadol faxed to pharm. Advised pt of Hopp's note.

## 2015-08-02 NOTE — Telephone Encounter (Signed)
Consider glucosamine sulfate 1500 mg daily for joint symptoms. Take this daily  for 3 months and then leave it off for 2 months. This will rehydrate the cartilages. Tramadol 1 q 8 hrs prn only , not routinely q 8 hrs #60 I recommend F/U with Dr Charlann Boxer or her Orthopedist if symptoms persist

## 2015-08-03 ENCOUNTER — Ambulatory Visit (INDEPENDENT_AMBULATORY_CARE_PROVIDER_SITE_OTHER): Payer: Medicare Other

## 2015-08-03 ENCOUNTER — Encounter (HOSPITAL_COMMUNITY): Payer: Self-pay

## 2015-08-03 DIAGNOSIS — J4550 Severe persistent asthma, uncomplicated: Secondary | ICD-10-CM | POA: Diagnosis not present

## 2015-08-08 ENCOUNTER — Encounter (HOSPITAL_COMMUNITY)
Admission: RE | Admit: 2015-08-08 | Discharge: 2015-08-08 | Disposition: A | Discharge status: Home / Self Care | Payer: Self-pay | Source: Ambulatory Visit | Attending: Pulmonary Disease | Admitting: Pulmonary Disease

## 2015-08-08 MED ORDER — OMALIZUMAB 150 MG ~~LOC~~ SOLR
150.0000 mg | Freq: Once | SUBCUTANEOUS | Status: AC
  Administered 2015-08-03: 150 mg via SUBCUTANEOUS

## 2015-08-10 ENCOUNTER — Encounter (HOSPITAL_COMMUNITY)
Admission: RE | Admit: 2015-08-10 | Discharge: 2015-08-10 | Disposition: A | Discharge status: Home / Self Care | Payer: Self-pay | Source: Ambulatory Visit | Attending: Pulmonary Disease | Admitting: Pulmonary Disease

## 2015-08-15 ENCOUNTER — Encounter (HOSPITAL_COMMUNITY): Payer: Medicare Other

## 2015-08-15 DIAGNOSIS — K219 Gastro-esophageal reflux disease without esophagitis: Secondary | ICD-10-CM | POA: Insufficient documentation

## 2015-08-15 DIAGNOSIS — J45909 Unspecified asthma, uncomplicated: Secondary | ICD-10-CM | POA: Insufficient documentation

## 2015-08-15 DIAGNOSIS — Z5189 Encounter for other specified aftercare: Secondary | ICD-10-CM | POA: Insufficient documentation

## 2015-08-15 DIAGNOSIS — J449 Chronic obstructive pulmonary disease, unspecified: Secondary | ICD-10-CM | POA: Insufficient documentation

## 2015-08-15 DIAGNOSIS — M810 Age-related osteoporosis without current pathological fracture: Secondary | ICD-10-CM | POA: Insufficient documentation

## 2015-08-15 DIAGNOSIS — G4730 Sleep apnea, unspecified: Secondary | ICD-10-CM | POA: Insufficient documentation

## 2015-08-17 ENCOUNTER — Encounter (HOSPITAL_COMMUNITY)
Admission: RE | Admit: 2015-08-17 | Discharge: 2015-08-17 | Disposition: A | Discharge status: Home / Self Care | Payer: Self-pay | Source: Ambulatory Visit | Attending: Pulmonary Disease | Admitting: Pulmonary Disease

## 2015-08-18 ENCOUNTER — Telehealth: Payer: Self-pay | Admitting: Internal Medicine

## 2015-08-22 ENCOUNTER — Encounter (HOSPITAL_COMMUNITY)
Admission: RE | Admit: 2015-08-22 | Discharge: 2015-08-22 | Disposition: A | Discharge status: Home / Self Care | Payer: Self-pay | Source: Ambulatory Visit | Attending: Pulmonary Disease | Admitting: Pulmonary Disease

## 2015-08-22 NOTE — Telephone Encounter (Signed)
#   vials:1 Ordered date:08/18/15 Shipping Date:08/21/15

## 2015-08-22 NOTE — Telephone Encounter (Signed)
#   Vials:1 Arrival Date:08/22/15 Lot #:[id number]   Exp Date:5/20

## 2015-08-23 ENCOUNTER — Other Ambulatory Visit: Payer: Self-pay | Admitting: Internal Medicine

## 2015-08-24 ENCOUNTER — Encounter (HOSPITAL_COMMUNITY)
Admission: RE | Admit: 2015-08-24 | Discharge: 2015-08-24 | Disposition: A | Discharge status: Home / Self Care | Payer: Self-pay | Source: Ambulatory Visit | Attending: Pulmonary Disease | Admitting: Pulmonary Disease

## 2015-08-28 ENCOUNTER — Other Ambulatory Visit: Payer: Self-pay

## 2015-08-28 MED ORDER — OLOPATADINE HCL 0.1 % OP SOLN: 1.0000 [drp] | Freq: Two times a day (BID) | OPHTHALMIC | Status: DC

## 2015-08-29 ENCOUNTER — Encounter (HOSPITAL_COMMUNITY)
Admission: RE | Admit: 2015-08-29 | Discharge: 2015-08-29 | Disposition: A | Discharge status: Home / Self Care | Payer: Self-pay | Source: Ambulatory Visit | Attending: Pulmonary Disease | Admitting: Pulmonary Disease

## 2015-08-31 ENCOUNTER — Encounter (HOSPITAL_COMMUNITY): Payer: Self-pay

## 2015-08-31 ENCOUNTER — Ambulatory Visit (INDEPENDENT_AMBULATORY_CARE_PROVIDER_SITE_OTHER): Payer: Medicare Other

## 2015-08-31 DIAGNOSIS — J4520 Mild intermittent asthma, uncomplicated: Secondary | ICD-10-CM | POA: Diagnosis not present

## 2015-08-31 MED ORDER — OMALIZUMAB 150 MG ~~LOC~~ SOLR
150.0000 mg | Freq: Once | SUBCUTANEOUS | Status: AC
  Administered 2015-08-31: 150 mg via SUBCUTANEOUS

## 2015-09-05 ENCOUNTER — Encounter (HOSPITAL_COMMUNITY): Payer: Self-pay

## 2015-09-07 ENCOUNTER — Encounter (HOSPITAL_COMMUNITY)
Admission: RE | Admit: 2015-09-07 | Discharge: 2015-09-07 | Disposition: A | Discharge status: Home / Self Care | Payer: Self-pay | Source: Ambulatory Visit | Attending: Pulmonary Disease | Admitting: Pulmonary Disease

## 2015-09-12 ENCOUNTER — Telehealth: Payer: Self-pay | Admitting: Internal Medicine

## 2015-09-12 ENCOUNTER — Encounter (HOSPITAL_COMMUNITY)
Admission: RE | Admit: 2015-09-12 | Discharge: 2015-09-12 | Disposition: A | Discharge status: Home / Self Care | Payer: Self-pay | Source: Ambulatory Visit | Attending: Pulmonary Disease | Admitting: Pulmonary Disease

## 2015-09-12 DIAGNOSIS — M810 Age-related osteoporosis without current pathological fracture: Secondary | ICD-10-CM | POA: Insufficient documentation

## 2015-09-12 DIAGNOSIS — G4730 Sleep apnea, unspecified: Secondary | ICD-10-CM | POA: Insufficient documentation

## 2015-09-12 DIAGNOSIS — K219 Gastro-esophageal reflux disease without esophagitis: Secondary | ICD-10-CM | POA: Insufficient documentation

## 2015-09-12 DIAGNOSIS — J449 Chronic obstructive pulmonary disease, unspecified: Secondary | ICD-10-CM | POA: Insufficient documentation

## 2015-09-12 DIAGNOSIS — J45909 Unspecified asthma, uncomplicated: Secondary | ICD-10-CM | POA: Insufficient documentation

## 2015-09-12 DIAGNOSIS — Z5189 Encounter for other specified aftercare: Secondary | ICD-10-CM | POA: Insufficient documentation

## 2015-09-12 NOTE — Telephone Encounter (Signed)
#   Vials:1 Arrival Date:09/12/15 Lot #[id number] Exp Date:5/19

## 2015-09-14 ENCOUNTER — Encounter (HOSPITAL_COMMUNITY)
Admission: RE | Admit: 2015-09-14 | Discharge: 2015-09-14 | Disposition: A | Discharge status: Home / Self Care | Payer: Self-pay | Source: Ambulatory Visit | Attending: Pulmonary Disease | Admitting: Pulmonary Disease

## 2015-09-18 ENCOUNTER — Encounter: Payer: Self-pay | Admitting: Internal Medicine

## 2015-09-18 ENCOUNTER — Ambulatory Visit (INDEPENDENT_AMBULATORY_CARE_PROVIDER_SITE_OTHER): Payer: Medicare Other | Admitting: Internal Medicine

## 2015-09-18 ENCOUNTER — Telehealth: Payer: Self-pay | Admitting: Internal Medicine

## 2015-09-18 DIAGNOSIS — J4541 Moderate persistent asthma with (acute) exacerbation: Secondary | ICD-10-CM

## 2015-09-18 DIAGNOSIS — R52 Pain, unspecified: Secondary | ICD-10-CM

## 2015-09-18 DIAGNOSIS — I2584 Coronary atherosclerosis due to calcified coronary lesion: Secondary | ICD-10-CM

## 2015-09-18 DIAGNOSIS — I2510 Atherosclerotic heart disease of native coronary artery without angina pectoris: Secondary | ICD-10-CM | POA: Diagnosis not present

## 2015-09-18 MED ORDER — PREDNISONE 10 MG PO TABS: ORAL_TABLET | ORAL | Status: DC

## 2015-09-18 MED ORDER — FLUTICASONE FUROATE-VILANTEROL 200-25 MCG/INH IN AEPB: 1.0000 | INHALATION_SPRAY | Freq: Every day | RESPIRATORY_TRACT | Status: DC

## 2015-09-18 NOTE — Telephone Encounter (Signed)
Dear [name] [name] - lst 2 visit sshe has not been happy about pain mgmt from [facility]. Says she is not being an opioid addict. Not understanding why there is so much strictness. Says doe not mind being referred to pain clinic but no referral from your offices. Also, wants new PCP since you are leaving  Could you please address.  Thanks  Dr. Brand Males, M.D., [facility].C.P Pulmonary and Critical Care Medicine Staff Physician [facility] Pager: [phone], If no answer or between  15:00h - 7:00h: call [phone]  09/18/2015 10:24 AM

## 2015-09-18 NOTE — Progress Notes (Signed)
Subjective:     Patient ID: [name] [name], female   DOB: July 06, 1941, 74 y.o.   MRN: [id number]  HPI       1. ASTHMA - Baseline Chronic High Risk (multiple admits), Allergic, Moderate-Severe Persistent Asthma  - Allergies, gerd, sinus exacerbators.  - Frequent prednisone -> daily low dose prednisone since FEb 20112  - PFT 11/06/2010 - Mixed obstruction - restriction - fev1 1L/50%, Ratio 54, 24% BD response, TLC 3.6/78%, DLCO 16/75%  - May 2012: Switched asthma care to [name]  - June 2012 PFT - showed normalization with prednisone: PFts 04/18/2011 shows NORMALIZATION (fev1 1.8L/93%, ratio 68, 9% BD response, DLCO 86).  - August 2012: Fev1: 1.1lL/58% -> prednisone burst-> 1.09L/60%, Ratio 65 (no  - Nov 2012: Fev1 0.97L/51%, Ratio 60 -> pred burst  - Dec 2012: Fev1 0.7L/37%, Ratio 67%  - August 2014: FEV1  1.17 L/62% - February 2016: FEV1 1.34/72%, ratio 71  2. Bronchiectasis, (mild RUL) seen on CT chest - April 2012 ( ABPA wu tests on prednisone in June 2012: asp antibody IgE asp fumigatus 0.24 ku/L which is borderline/equivocal, IgG for all all aspergiluus preciptins - NEGATIVE. 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 Allergic rhinitis and allergic asthma.  - 2000s: - per hx strongly skin test positive at Dr Neldon Mc office  - 2004 - trialed xolair - per hx effective but developed rash which she subjectively attributed to twice daily dosing (thogh chart review 2004-2005 states it was stopped due to subjective lack of improvement and an incidental rash resolution was noted in retrospect after stopping xolair)  - 2009: IgE 306 in July 2009. RAST positive for various grass, oak, elm, ragweed, plantain, lamb etc., s/p Allergy shot trial with Dr Annamaria Boots - stopped due to transport issues  - 2012: May - IgE 588.  - 2012: June - moved allergy MD to Dr Hardie Pulley  - 2013: Restart Xolair  - 2016 (early) - half dose xolair   4.  Lung nodule, 6 mm, on CT scan. - April 2012. Possibly calcified. RUL (no prior for comparison but a 07/17/2000 CT chest reports simillar size nodule in similar location). Repeat CT needed April 2013   5. Gastroesophageal reflux disease with hx of UGI scope showing Hiatal hernia (small)  - delayed gastric empyting 2010 March  6. History of sleep apnea.- non compliant with CPAP   7. Significant anxiety (also mood lability hx with prednisone, worsening anxiety with albuterol)    8. Recurrent AE-ASthma  - Admitted 4/23-4/30/12  - OPD AE asthma Rx: July 2012, august 2012, Nov 2012  - Office treatment with Levaquin and prednisone-November 2013 -  Levaquin and prednisone -January 2014   #Obesity  - 192# - nov 2013. STart low glycemic diet  - 178#  - 03/10/2013. Body mass index is 31.03 kg/(m^2). - 175# - 04/08/13 - 168# - 05/12/2013 with Body mass index is 28.93 kg/(m^2). - 167# on 07/07/2013 with Body mass index is 28.65 kg/(m^2). -  154# on 10/18/2013 wuth Body mass index is 26.42 kg/(m^2).  - Body mass index is 27.79 kg/(m^2). on 07/22/2014 - Body mass index is 27.79 kg/(m^2). - 03/31/15    OV 03/31/2015  Chief Complaint  Patient presents with  . Follow-up    Pt recently seen by TP on 03/23/15 for an acute visit. Pt states she is feeling better but not at baseline. Pt c/o prod cough with  light yellow mucus- pt stated the color is lightening. Pt c/o intermittent chest tightness.     Follow-up severe persistent asthma with question of noncompliance and elevated chronic peripheral eosinophils  She came off chronic prednisone a month ago after we discussed and made this is a mutual plan. She saw my nurse practitioner one week ago and was an exacerbation presumably because of a defective Symbicort sample. Prednisone only helped a little bit but she feels she is back in exacerbation again presumably because of change in weather. She is wheezing more coughing more and has more chest tightness. She  does not want to try Xopenex anymore but instead wants to try the new pro-air click   Exhaled nitric oxide is 41 ppb and is significantly elevated today  REview of labs shows- chronic peripheral eosinophilia - 800cells to 1100 cells/cu.mm betweeen sept 2013 and June 2015   OV 06/05/2015  Chief Complaint  Patient presents with  . Follow-up    Pt states she has good days and bad days but has increase in SOB with hot weather. Pt denies significant cough and CP/tightness.    Follow-up moderate to severe asthma  - She is now on daily prednisone. Last time I saw her she was in exacerbation we treated her with prednisone burst. Since then she has significant improvement. Exhaled nitric oxide today's 24 ppb and show significant improvement in asthma. Asthma control questionnaire shows symptoms with an average score of 2.2 which means she still symptomatic in the last 1 week. Specifically she's having asthma symptoms at least a few times during the night and very mild symptoms when she wakes up in the morning and she is moderately limited with her activities because of asthma and a moderate amount of shortness of breath because of asthma and she's wheezing a little of the time and using albuterol inhaler at least 2-4 puffs as needed each day.  Labs  -03/31/2015 ANCA screen was trace positive but specific antibopdies negative. So overall normal result for presence of vasculitis. She has eosinophilia but is alwaus< 1100 so I do not think she has vasculitis. But the last eosinophil count was in 2015 June    - FeNO 06/05/2015 is 24ppb    Past medical history  - She reports frustration with her primary care physician Gwendolyn Grant, MD who apparently discontinued her OP avoid which she was taking when necessary and offered a Xanax which she does not want to do. This is because of a negative urine screen for when necessary. Patient says she's not diverting the drug  OV 09/18/2015  Chief Complaint   Patient presents with  . Follow-up    Pt c/o slight increase in SOB, dry cough, PND x 2 days. Pt c/o midsternal CP during the night. Pt denies f/c/s.     Follow-up moderate persistent asthma. No longer on chronic steroids. She is on Symbicort, Singulair and Xolair.  At last visit she deferred  Nucala insulin Xolair. Overall asthma stable. However-symptom burden continues. Asthma control 5. questionnaire shows symptoms score of 2.0 but then she also tells me that this is worse in the last few weeks even though compared to prior visits the score is the same. Specifically at night she wakes up a few times because of asthma. In the morning when she wakes up she is very mild symptoms. She is moderately limited in her activities because of asthma and she experiences moderate shortness of breath because of asthma. She is wheezing a little of  the time and is using albuterol for rescue 3-4 puffs most days. On the contrary exhaled nitric oxide today is normal at 19 ppb. Nevertheless she feels that she was benefiting in the past from steroids and she feels she needs the same again. She feels she is in an exacerbation.  Other issues  - She feels that we are over billing her from it Xolair injections and she wants to talk to manager  - She is frustrated by her chronic pain management and the fact that her primary care physician will be leaving the practice soon. She is frustrated that primary care office will not refill her pain medications. She is agreeable to go to a pain clinic but says she has not been referred to one. She wants me to talk to her primary care physician Gwendolyn Grant, MD      Current outpatient prescriptions:  .  amLODipine (NORVASC) 5 MG tablet, Take 1 tablet (5 mg total) by mouth daily., Disp: 90 tablet, Rfl: 3 .  cycloSPORINE (RESTASIS) 0.05 % ophthalmic emulsion, Place 1 drop into both eyes 2 (two) times daily., Disp: 60 mL, Rfl: 2 .  desloratadine (CLARINEX) 5 MG tablet, Take 1  tablet (5 mg total) by mouth daily., Disp: 90 tablet, Rfl: 3 .  esomeprazole (NEXIUM) 40 MG capsule, Take 1 capsule (40 mg total) by mouth daily at 12 noon., Disp: 90 capsule, Rfl: 3 .  levalbuterol (XOPENEX HFA) 45 MCG/ACT inhaler, Inhale 2 puffs into the lungs every 4 (four) hours as needed for wheezing or shortness of breath., Disp: 15 Inhaler, Rfl: 2 .  montelukast (SINGULAIR) 10 MG tablet, Take 1 tablet (10 mg total) by mouth at bedtime., Disp: 90 tablet, Rfl: 3 .  olopatadine (PATANOL) 0.1 % ophthalmic solution, Place 1 drop into both eyes 2 (two) times daily. Appt with PCP needed for additional refills., Disp: 5 mL, Rfl: 1 .  omalizumab (XOLAIR) 150 MG injection, Inject 300 mg into the skin every 28 (twenty-eight) days., Disp: , Rfl:  .  SYMBICORT 160-4.5 MCG/ACT inhaler, INHALE 2 PUFFS INTO THE LUNGS 2 (TWO) TIMES DAILY., Disp: 30.6 Inhaler, Rfl: 3 .  Vitamin D, Ergocalciferol, (DRISDOL) 50000 UNITS CAPS capsule, TAKE 1 CAPSULE (50,000 UNITS TOTAL) BY MOUTH EVERY 7 (SEVEN) DAYS., Disp: 12 capsule, Rfl: 3 .  XOPENEX 0.63 MG/3ML nebulizer solution, Take 3 mLs (0.63 mg total) by nebulization every 6 (six) hours as needed for wheezing or shortness of breath. Dx J45.50, Disp: 360 mL, Rfl: 0 .  EPINEPHrine (EPIPEN 2-PAK) 0.3 mg/0.3 mL IJ SOAJ injection, Inject 0.3 mLs (0.3 mg total) into the muscle once. (Patient not taking: Reported on 09/18/2015), Disp: 2 Device, Rfl: 1   Allergies  Allergen Reactions  . 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 Latex Anaphylaxis  . Advair Diskus [Fluticasone-Salmeterol]     Tingling in mouth/ears ringing  . Diclofenac Sodium     REACTION: Hives  . Doxycycline Nausea And Vomiting  . Dulera [Mometasone Furo-Formoterol Fum]     HA  . Penicillins     Tongue swelling  . Sulfonamide Derivatives     Tongue swelling   . Tiotropium Bromide Monohydrate     Tongue/mouth itching Dysuria   . Albuterol Anxiety    Switched to xopenex     Immunization History  Administered Date(s) Administered  . Pneumococcal Conjugate-13 09/20/2013  . Pneumococcal Polysaccharide-23 12/25/2010  . Td 01/09/2005     Review of Systems As per history  of present illness    Objective:   Physical Exam  Constitutional: She is oriented to person, place, and time. She appears well-developed and well-nourished. No distress.  Obese  HENT:  Head: Normocephalic and atraumatic.  Right Ear: External ear normal.  Left Ear: External ear normal.  Mouth/Throat: Oropharynx is clear and moist. No oropharyngeal exudate.  Eyes: Conjunctivae and EOM are normal. Pupils are equal, round, and reactive to light. Right eye exhibits no discharge. Left eye exhibits no discharge. No scleral icterus.  Neck: Normal range of motion. Neck supple. No JVD present. No tracheal deviation present. No thyromegaly present.  Cardiovascular: Normal rate, regular rhythm, normal heart sounds and intact distal pulses.  Exam reveals no gallop and no friction rub.   No murmur heard. Pulmonary/Chest: Effort normal and breath sounds normal. No respiratory distress. She has no wheezes. She has no rales. She exhibits no tenderness.  Abdominal: Soft. Bowel sounds are normal. She exhibits no distension and no mass. There is no tenderness. There is no rebound and no guarding.  Musculoskeletal: Normal range of motion. She exhibits no edema or tenderness.  Lymphadenopathy:    She has no cervical adenopathy.  Neurological: She is alert and oriented to person, place, and time. She has normal reflexes. No cranial nerve deficit. She exhibits normal muscle tone. Coordination normal.  Skin: Skin is warm and dry. No rash noted. She is not diaphoretic. No erythema. No pallor.  Psychiatric: She has a normal mood and affect. Her behavior is normal. Judgment and thought content normal.  Vitals reviewed.  Filed Vitals:   09/18/15 0948  BP: 172/96  Pulse: 59  Height: 5\' 4"  (1.626 m)  Weight:  171 lb 12.8 oz (77.928 kg)  SpO2: 98%       Assessment:       ICD-9-CM ICD-10-CM   1. Asthma with acute exacerbation, moderate persistent 493.92 J45.41        Plan:      I do not understand how he will have normal exam nitric oxide but excess of asthma symptoms. For now we'll treat this as an exacerbation. Possible over perception versus exhaled nitric oxide not a good correlate in her. However, I doubt the exam nitric oxide is not accurate in her because she has eosinophilia and she has allergic asthma  Plan Take prednisone 40 mg daily x 2 days, then 20mg  daily x 2 days, then 10mg  daily x 2 days, then 5mg  daily x 2 days and stop Stop Symbicort. Instead start high-dose Brio one puff once daily Continue Xolair injections  - My office manager will meet with you to discuss billing issues  - Respect position to defer trying Nucala Continue Singulair Use albuterol/Xopenex as needed  #chronic pain and new PCP  - have written to pcp Gwendolyn Grant, MD to address these issue  Followup   - - full PFT in 3 monmtnhs - ROV 3 months   -  do exhaled oxide also in 3 months and correlate her pulmonary function test symptoms and exam nitric oxide      > 50% of this > 25 min visit spent in face to face counseling or coordination of care   Dr. Brand Males, M.D., [facility].C.P Pulmonary and Critical Care Medicine Staff Physician [place] [facility] Pager: [phone], If no answer or between  15:00h - 7:00h: call [phone]  09/18/2015 10:28 AM

## 2015-09-18 NOTE — Telephone Encounter (Signed)
Tried to contact patient and there was no answer.

## 2015-09-18 NOTE — Patient Instructions (Addendum)
ICD-9-CM ICD-10-CM   1. Asthma with acute exacerbation, moderate persistent 493.92 J45.41    I do not understand how he will have normal exam nitric oxide but excess of asthma symptoms. For now we'll treat this as an exacerbation  Plan Take prednisone 40 mg daily x 2 days, then 20mg  daily x 2 days, then 10mg  daily x 2 days, then 5mg  daily x 2 days and stop Stop Symbicort. Instead start high-dose Brio one puff once daily Continue Xolair injections  - My office manager will meet with you to discuss billing issues  - Respect position to defer trying Nucala Continue Singulair Use albuterol/Xopenex as needed  #chronic pain and new PCP  - have written to pcp Gwendolyn Grant, MD to address these issue  Followup   - - full PFT in 3 monmtnhs - ROV 3 months - do exhaled oxide also in 3 months and correlate her pulmonary function test symptoms and exam nitric oxide

## 2015-09-18 NOTE — Telephone Encounter (Signed)
Pt needs to establish with new PCP.   Pain management referral entered. No narc rx will be given by [facility] due to 2 neg UDS and is reason for dc of med per DEA reg.

## 2015-09-19 ENCOUNTER — Encounter (HOSPITAL_COMMUNITY)
Admission: RE | Admit: 2015-09-19 | Discharge: 2015-09-19 | Disposition: A | Discharge status: Home / Self Care | Payer: Self-pay | Source: Ambulatory Visit | Attending: Pulmonary Disease | Admitting: Pulmonary Disease

## 2015-09-21 ENCOUNTER — Encounter (HOSPITAL_COMMUNITY)
Admission: RE | Admit: 2015-09-21 | Discharge: 2015-09-21 | Disposition: A | Discharge status: Home / Self Care | Payer: Self-pay | Source: Ambulatory Visit | Attending: Pulmonary Disease | Admitting: Pulmonary Disease

## 2015-09-22 ENCOUNTER — Ambulatory Visit: Payer: Medicare Other | Admitting: Internal Medicine

## 2015-09-26 ENCOUNTER — Encounter (HOSPITAL_COMMUNITY)
Admission: RE | Admit: 2015-09-26 | Discharge: 2015-09-26 | Disposition: A | Discharge status: Home / Self Care | Payer: Self-pay | Source: Ambulatory Visit | Attending: Pulmonary Disease | Admitting: Pulmonary Disease

## 2015-09-26 NOTE — Telephone Encounter (Signed)
Can you please call pt at [phone] I established her with [name]. She has some questions for you.

## 2015-09-27 NOTE — Telephone Encounter (Signed)
Spoke to pt and she stated that she has not heard anything about scheduling an appt with pain clinic. I explained that it takes time to get into the pain clinic. She was upset that I was not able to let her know when she should expect an appt.  Can you help with this; any information is appreciated.

## 2015-09-28 ENCOUNTER — Encounter (HOSPITAL_COMMUNITY)
Admission: RE | Admit: 2015-09-28 | Discharge: 2015-09-28 | Disposition: A | Discharge status: Home / Self Care | Payer: Self-pay | Source: Ambulatory Visit | Attending: Pulmonary Disease | Admitting: Pulmonary Disease

## 2015-09-28 ENCOUNTER — Ambulatory Visit: Payer: Medicare Other

## 2015-09-28 NOTE — Telephone Encounter (Signed)
She should expect a call within 1-2 months from the time the referral was sent to pain mgmt. Due to her insurance (Medicaid secondary), our options are limited as to where we can send her. Currently under review at [place].

## 2015-09-29 NOTE — Telephone Encounter (Signed)
Contacted pt and gave information as stated below.

## 2015-10-02 ENCOUNTER — Ambulatory Visit (INDEPENDENT_AMBULATORY_CARE_PROVIDER_SITE_OTHER): Payer: Medicare Other

## 2015-10-02 ENCOUNTER — Telehealth: Payer: Self-pay | Admitting: Internal Medicine

## 2015-10-02 DIAGNOSIS — J4550 Severe persistent asthma, uncomplicated: Secondary | ICD-10-CM

## 2015-10-02 NOTE — Telephone Encounter (Signed)
Pt called back, can be here for 3:30 xolair inj.  This has been scheduled.  Nothing further needed.

## 2015-10-03 ENCOUNTER — Encounter (HOSPITAL_COMMUNITY)
Admission: RE | Admit: 2015-10-03 | Discharge: 2015-10-03 | Disposition: A | Discharge status: Home / Self Care | Payer: Self-pay | Source: Ambulatory Visit | Attending: Pulmonary Disease | Admitting: Pulmonary Disease

## 2015-10-03 MED ORDER — OMALIZUMAB 150 MG ~~LOC~~ SOLR
150.0000 mg | Freq: Once | SUBCUTANEOUS | Status: AC
  Administered 2015-10-03: 150 mg via SUBCUTANEOUS

## 2015-10-04 ENCOUNTER — Other Ambulatory Visit: Payer: PRIVATE HEALTH INSURANCE

## 2015-10-04 ENCOUNTER — Ambulatory Visit (INDEPENDENT_AMBULATORY_CARE_PROVIDER_SITE_OTHER)
Admission: RE | Admit: 2015-10-04 | Discharge: 2015-10-04 | Disposition: A | Discharge status: Home / Self Care | Payer: Medicare Other | Source: Ambulatory Visit | Attending: Internal Medicine | Admitting: Internal Medicine

## 2015-10-04 ENCOUNTER — Ambulatory Visit (INDEPENDENT_AMBULATORY_CARE_PROVIDER_SITE_OTHER): Payer: Medicare Other | Admitting: Internal Medicine

## 2015-10-04 ENCOUNTER — Encounter: Payer: Self-pay | Admitting: Internal Medicine

## 2015-10-04 VITALS — BP 142/90 | HR 54 | Temp 97.8°F | Resp 16 | Ht 64.0 in | Wt 173.0 lb

## 2015-10-04 DIAGNOSIS — R21 Rash and other nonspecific skin eruption: Secondary | ICD-10-CM

## 2015-10-04 DIAGNOSIS — Z5189 Encounter for other specified aftercare: Secondary | ICD-10-CM | POA: Diagnosis not present

## 2015-10-04 DIAGNOSIS — M810 Age-related osteoporosis without current pathological fracture: Secondary | ICD-10-CM | POA: Diagnosis not present

## 2015-10-04 DIAGNOSIS — I2510 Atherosclerotic heart disease of native coronary artery without angina pectoris: Secondary | ICD-10-CM | POA: Diagnosis not present

## 2015-10-04 DIAGNOSIS — I2584 Coronary atherosclerosis due to calcified coronary lesion: Secondary | ICD-10-CM

## 2015-10-04 DIAGNOSIS — E669 Obesity, unspecified: Secondary | ICD-10-CM | POA: Diagnosis not present

## 2015-10-04 DIAGNOSIS — R52 Pain, unspecified: Secondary | ICD-10-CM

## 2015-10-04 MED ORDER — TRIAMCINOLONE ACETONIDE 0.1 % EX CREA: 1.0000 "application " | TOPICAL_CREAM | Freq: Two times a day (BID) | CUTANEOUS | Status: DC

## 2015-10-04 MED ORDER — DICLOFENAC SODIUM 1 % TD GEL: 2.0000 g | Freq: Four times a day (QID) | TRANSDERMAL | Status: DC

## 2015-10-04 NOTE — Progress Notes (Signed)
Pre visit review using our clinic review tool, if applicable. No additional management support is needed unless otherwise documented below in the visit note. 

## 2015-10-04 NOTE — Assessment & Plan Note (Signed)
Did personally review her UDS at today's visit and 3 negative screenings for both xanax and norco. Did also review [place] narcotic database and she was filling her rx for norco every month (or sooner) during the time of negative screenings so not able to prescribe any controlled substances. Did talk to her about the fact that tramadol was also controlled and could not be prescribed by me. Talked to her about other forms of pain management and will try voltaren gel. Other alternatives would include lidoderm patches. She was also advised that knee injections (synvisc) may be helpful but she is very concerned about this and does not want to see orthopedics. She will think about it. Did remind her that since we are not a medicaid provider this is why her referral is taking so long to complete.

## 2015-10-04 NOTE — Progress Notes (Signed)
   Subjective:    Patient ID: [name] [name], female    DOB: 04/28/1941, 74 y.o.   MRN: [id number]  HPI The patient is a 74 YO female coming in with multiple acute concerns. She is worried about her arthritis pain and is not sure why her doctor took her off her medicines. She is upset as she did withdraw off them and now is having pain in her knees. She has not wanted to pursue injections in her knees as they would be cortisone and she has breathing problems and did not want that to interfere. She is able to do her ADLs through the pain. She is not able to take any OTC medications for her pain due to her breathing. She has not tried tylenol.  Her next concern is a rash on her leg which has come back. Has been treated with an ointment in the past and it went right away. She is having mild itching and mild drainage. Some serous fluid oozing. No fevers or chills. No leg swelling or pain. Since its arrival has not spread.   Review of Systems  Constitutional: Negative for fever, activity change, appetite change, fatigue and unexpected weight change.  Respiratory: Positive for cough and shortness of breath. Negative for chest tightness and wheezing.   Cardiovascular: Negative for chest pain, palpitations and leg swelling.  Gastrointestinal: Negative for nausea, abdominal pain, diarrhea, constipation and abdominal distention.  Musculoskeletal: Positive for arthralgias. Negative for myalgias, back pain and neck pain.  Skin: Positive for rash. Negative for color change and wound.  Neurological: Negative.   Psychiatric/Behavioral: Negative.       Objective:   Physical Exam  Constitutional: She is oriented to person, place, and time. She appears well-developed and well-nourished.  HENT:  Head: Normocephalic and atraumatic.  Eyes: EOM are normal.  Neck: Normal range of motion.  Cardiovascular: Normal rate and regular rhythm.   Pulmonary/Chest: Effort normal and breath sounds normal.  Abdominal: Soft.    Musculoskeletal: She exhibits no edema.  Neurological: She is alert and oriented to person, place, and time.  Skin: Skin is warm and dry.  Rash on the medial shin with some darkening of the skin, no breakdown and no papules.    Filed Vitals:   10/04/15 0841  BP: 170/92  Pulse: 54  Temp: 97.8 F (36.6 C)  TempSrc: Oral  Resp: 16  Height: 5\' 4"  (1.626 m)  Weight: 173 lb (78.472 kg)  SpO2: 98%      Assessment & Plan:

## 2015-10-04 NOTE — Patient Instructions (Signed)
We have sent in a cream for the leg called triamcinolone cream that you can use once a day.   We have also sent in a gel for the pain called voltaren that you can rub where you have pain.   Unfortunately we are not able to prescribe controlled substances for you and will continue working on the pain referral but will try to address your pain in other ways until that time.   We will get the bone density test ordered.   If you want to come back in December for your wellness exam that would be fine.

## 2015-10-04 NOTE — Assessment & Plan Note (Signed)
Did talk to her about the fact that her weight is likely limiting her breathing as well as worsening her OA. She will work on more exercise (going to pulmonary rehab) and working on diet.

## 2015-10-04 NOTE — Assessment & Plan Note (Signed)
Rx for triamcinolone cream for her rash on her leg. If no improvement will refer to dermatology for further evaluation.

## 2015-10-05 ENCOUNTER — Encounter (HOSPITAL_COMMUNITY): Payer: Self-pay

## 2015-10-10 ENCOUNTER — Encounter (HOSPITAL_COMMUNITY): Payer: Self-pay

## 2015-10-11 ENCOUNTER — Telehealth: Payer: Self-pay | Admitting: Internal Medicine

## 2015-10-11 NOTE — Telephone Encounter (Signed)
#   Vials:1 Arrival Date:10/11/15 Lot [id number] Exp Date:1/20

## 2015-10-12 ENCOUNTER — Encounter (HOSPITAL_COMMUNITY)
Admission: RE | Admit: 2015-10-12 | Discharge: 2015-10-12 | Disposition: A | Discharge status: Home / Self Care | Payer: Self-pay | Source: Ambulatory Visit | Attending: Pulmonary Disease | Admitting: Pulmonary Disease

## 2015-10-12 DIAGNOSIS — K219 Gastro-esophageal reflux disease without esophagitis: Secondary | ICD-10-CM | POA: Insufficient documentation

## 2015-10-12 DIAGNOSIS — J45909 Unspecified asthma, uncomplicated: Secondary | ICD-10-CM | POA: Insufficient documentation

## 2015-10-12 DIAGNOSIS — M810 Age-related osteoporosis without current pathological fracture: Secondary | ICD-10-CM | POA: Insufficient documentation

## 2015-10-12 DIAGNOSIS — Z5189 Encounter for other specified aftercare: Secondary | ICD-10-CM | POA: Insufficient documentation

## 2015-10-12 DIAGNOSIS — J449 Chronic obstructive pulmonary disease, unspecified: Secondary | ICD-10-CM | POA: Insufficient documentation

## 2015-10-12 DIAGNOSIS — G4730 Sleep apnea, unspecified: Secondary | ICD-10-CM | POA: Insufficient documentation

## 2015-10-17 ENCOUNTER — Encounter (HOSPITAL_COMMUNITY): Payer: Self-pay

## 2015-10-19 ENCOUNTER — Encounter (HOSPITAL_COMMUNITY)
Admission: RE | Admit: 2015-10-19 | Discharge: 2015-10-19 | Disposition: A | Discharge status: Home / Self Care | Payer: Self-pay | Source: Ambulatory Visit | Attending: Pulmonary Disease | Admitting: Pulmonary Disease

## 2015-10-24 ENCOUNTER — Emergency Department (HOSPITAL_COMMUNITY): Payer: Medicare Other

## 2015-10-24 ENCOUNTER — Encounter (HOSPITAL_COMMUNITY)
Admission: RE | Admit: 2015-10-24 | Discharge: 2015-10-24 | Disposition: A | Discharge status: Home / Self Care | Payer: Self-pay | Source: Ambulatory Visit | Attending: Pulmonary Disease | Admitting: Pulmonary Disease

## 2015-10-24 ENCOUNTER — Emergency Department (HOSPITAL_COMMUNITY)
Admission: EM | Admit: 2015-10-24 | Discharge: 2015-10-24 | Disposition: A | Discharge status: Home / Self Care | Payer: Medicare Other | Attending: Emergency Medicine | Admitting: Emergency Medicine

## 2015-10-24 ENCOUNTER — Encounter (HOSPITAL_COMMUNITY): Payer: Self-pay | Admitting: Emergency Medicine

## 2015-10-24 ENCOUNTER — Telehealth: Payer: Self-pay | Admitting: Internal Medicine

## 2015-10-24 DIAGNOSIS — Z79899 Other long term (current) drug therapy: Secondary | ICD-10-CM | POA: Diagnosis not present

## 2015-10-24 DIAGNOSIS — I10 Essential (primary) hypertension: Secondary | ICD-10-CM | POA: Insufficient documentation

## 2015-10-24 DIAGNOSIS — Z87891 Personal history of nicotine dependence: Secondary | ICD-10-CM | POA: Diagnosis not present

## 2015-10-24 DIAGNOSIS — K219 Gastro-esophageal reflux disease without esophagitis: Secondary | ICD-10-CM | POA: Insufficient documentation

## 2015-10-24 DIAGNOSIS — N390 Urinary tract infection, site not specified: Secondary | ICD-10-CM | POA: Diagnosis not present

## 2015-10-24 DIAGNOSIS — R0603 Acute respiratory distress: Secondary | ICD-10-CM

## 2015-10-24 DIAGNOSIS — Z880 Allergy status to penicillin: Secondary | ICD-10-CM | POA: Diagnosis not present

## 2015-10-24 DIAGNOSIS — E785 Hyperlipidemia, unspecified: Secondary | ICD-10-CM | POA: Insufficient documentation

## 2015-10-24 DIAGNOSIS — F419 Anxiety disorder, unspecified: Secondary | ICD-10-CM | POA: Diagnosis not present

## 2015-10-24 DIAGNOSIS — Z86010 Personal history of colonic polyps: Secondary | ICD-10-CM | POA: Diagnosis not present

## 2015-10-24 DIAGNOSIS — R0602 Shortness of breath: Secondary | ICD-10-CM | POA: Diagnosis not present

## 2015-10-24 DIAGNOSIS — J441 Chronic obstructive pulmonary disease with (acute) exacerbation: Secondary | ICD-10-CM | POA: Insufficient documentation

## 2015-10-24 DIAGNOSIS — Z91040 Latex allergy status: Secondary | ICD-10-CM | POA: Diagnosis not present

## 2015-10-24 LAB — BASIC METABOLIC PANEL
ANION GAP: 11 (ref 5–15)
BUN: 15 mg/dL (ref 6–20)
CHLORIDE: 105 mmol/L (ref 101–111)
CO2: 20 mmol/L — AB (ref 22–32)
CREATININE: 1.12 mg/dL — AB (ref 0.44–1.00)
Calcium: 9.3 mg/dL (ref 8.9–10.3)
GFR calc non Af Amer: 48 mL/min — ABNORMAL LOW (ref >60)
GFR, EST AFRICAN AMERICAN: 55 mL/min — AB (ref >60)
Glucose, Bld: 130 mg/dL — ABNORMAL HIGH (ref 65–99)
POTASSIUM: 4.2 mmol/L (ref 3.5–5.1)
SODIUM: 136 mmol/L (ref 135–145)

## 2015-10-24 LAB — URINE MICROSCOPIC-ADD ON: Squamous Epithelial / LPF: NONE SEEN

## 2015-10-24 LAB — URINALYSIS, ROUTINE W REFLEX MICROSCOPIC
Bilirubin Urine: NEGATIVE
GLUCOSE, UA: NEGATIVE mg/dL
KETONES UR: NEGATIVE mg/dL
NITRITE: POSITIVE — AB
PH: 6 (ref 5.0–8.0)
Protein, ur: NEGATIVE mg/dL
Specific Gravity, Urine: 1.01 (ref 1.005–1.030)

## 2015-10-24 LAB — I-STAT CHEM 8, ED
BUN: 18 mg/dL (ref 6–20)
CHLORIDE: 103 mmol/L (ref 101–111)
CREATININE: 1 mg/dL (ref 0.44–1.00)
Calcium, Ion: 1.16 mmol/L (ref 1.13–1.30)
Glucose, Bld: 128 mg/dL — ABNORMAL HIGH (ref 65–99)
HEMATOCRIT: 40 % (ref 36.0–46.0)
Hemoglobin: 13.6 g/dL (ref 12.0–15.0)
POTASSIUM: 4.2 mmol/L (ref 3.5–5.1)
SODIUM: 138 mmol/L (ref 135–145)
TCO2: 24 mmol/L (ref 0–100)

## 2015-10-24 LAB — CBC
HEMATOCRIT: 38.3 % (ref 36.0–46.0)
HEMOGLOBIN: 11.8 g/dL — AB (ref 12.0–15.0)
MCH: 27.5 pg (ref 26.0–34.0)
MCHC: 30.8 g/dL (ref 30.0–36.0)
MCV: 89.3 fL (ref 78.0–100.0)
Platelets: 181 10*3/uL (ref 150–400)
RBC: 4.29 MIL/uL (ref 3.87–5.11)
RDW: 13.2 % (ref 11.5–15.5)
WBC: 9.3 10*3/uL (ref 4.0–10.5)

## 2015-10-24 LAB — I-STAT CG4 LACTIC ACID, ED
Lactic Acid, Venous: 2.66 mmol/L (ref 0.5–2.0)
Lactic Acid, Venous: 1.69 mmol/L (ref 0.5–2.0)

## 2015-10-24 MED ORDER — DEXTROSE 5 % IV SOLN
2.0000 g | INTRAVENOUS | Status: AC
  Administered 2015-10-24: 2 g via INTRAVENOUS
  Filled 2015-10-24: qty 2

## 2015-10-24 MED ORDER — VANCOMYCIN HCL 10 G IV SOLR
1500.0000 mg | INTRAVENOUS | Status: AC
  Administered 2015-10-24: 1500 mg via INTRAVENOUS
  Filled 2015-10-24: qty 1500

## 2015-10-24 MED ORDER — PREDNISONE 20 MG PO TABS
60.0000 mg | ORAL_TABLET | Freq: Once | ORAL | Status: AC
  Administered 2015-10-24: 60 mg via ORAL
  Filled 2015-10-24: qty 3

## 2015-10-24 MED ORDER — SODIUM CHLORIDE 0.9 % IV BOLUS (SEPSIS)
1000.0000 mL | INTRAVENOUS | Status: AC
  Administered 2015-10-24 (×2): 1000 mL via INTRAVENOUS

## 2015-10-24 MED ORDER — VANCOMYCIN HCL IN DEXTROSE 1-5 GM/200ML-% IV SOLN: 1000.0000 mg | INTRAVENOUS | Status: DC

## 2015-10-24 MED ORDER — CIPROFLOXACIN HCL 250 MG PO TABS: 250.0000 mg | ORAL_TABLET | Freq: Two times a day (BID) | ORAL | Status: DC

## 2015-10-24 MED ORDER — DEXTROSE 5 % IV SOLN
1.0000 g | Freq: Three times a day (TID) | INTRAVENOUS | Status: DC
  Filled 2015-10-24 (×2): qty 1

## 2015-10-24 MED ORDER — ACETAMINOPHEN 325 MG PO TABS
650.0000 mg | ORAL_TABLET | Freq: Once | ORAL | Status: AC
  Administered 2015-10-24: 650 mg via ORAL
  Filled 2015-10-24: qty 2

## 2015-10-24 MED ORDER — SODIUM CHLORIDE 0.9 % IV BOLUS (SEPSIS)
500.0000 mL | INTRAVENOUS | Status: AC
  Administered 2015-10-24: 500 mL via INTRAVENOUS

## 2015-10-24 MED ORDER — OSELTAMIVIR PHOSPHATE 75 MG PO CAPS
75.0000 mg | ORAL_CAPSULE | Freq: Once | ORAL | Status: AC
  Administered 2015-10-24: 75 mg via ORAL
  Filled 2015-10-24: qty 1

## 2015-10-24 NOTE — ED Notes (Signed)
Admitting at bedside 

## 2015-10-24 NOTE — Telephone Encounter (Signed)
Patient is currently in ED at [facility].   Attempted to call her back, went directly to voicemail.  FYI to MR

## 2015-10-24 NOTE — Progress Notes (Signed)
ANTIBIOTIC CONSULT NOTE - INITIAL  Pharmacy Consult for vancomycin + aztreonam Indication: rule out pneumonia  Allergies  Allergen Reactions  . 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 Latex Anaphylaxis  . Advair Diskus [Fluticasone-Salmeterol]     Tingling in mouth/ears ringing  . Diclofenac Sodium     REACTION: Hives  . Doxycycline Nausea And Vomiting  . Dulera [Mometasone Furo-Formoterol Fum]     HA  . Penicillins     Tongue swelling  . Sulfonamide Derivatives     Tongue swelling   . Tiotropium Bromide Monohydrate     Tongue/mouth itching Dysuria   . Albuterol Anxiety    Switched to xopenex    Patient Measurements: Height: 5\' 4"  (162.6 cm) Weight: 168 lb (76.204 kg) IBW/kg (Calculated) : 54.7 Adjusted Body Weight:   Vital Signs: Temp: 103.7 F (39.8 C) (12/13 0944) Temp Source: Rectal (12/13 0944) BP: 157/64 mmHg (12/13 0944) Pulse Rate: 94 (12/13 0944) Intake/Output from previous day:   Intake/Output from this shift:    Labs:  Recent Labs  10/24/15 1000 10/24/15 1025  WBC 9.3  --   HGB 11.8* 13.6  PLT 181  --   CREATININE  --  1.00   Estimated Creatinine Clearance: 50.8 mL/min (by C-G formula based on Cr of 1). No results for input(s): VANCOTROUGH, VANCOPEAK, VANCORANDOM, GENTTROUGH, GENTPEAK, GENTRANDOM, TOBRATROUGH, TOBRAPEAK, TOBRARND, AMIKACINPEAK, AMIKACINTROU, AMIKACIN in the last 72 hours.   Microbiology: No results found for this or any previous visit (from the past 720 hour(s)).  Medical History: Past Medical History  Diagnosis Date  . Sleep apnea   . ALLERGIC RHINITIS   . Colon polyp, hyperplastic 02/2003, 03/2014  . Chronic rhinosinusitis   . Hyperlipidemia   . Osteoporosis   . GERD (gastroesophageal reflux disease)   . Hiatal hernia   . Helicobacter pylori gastritis 02/2009    partially treated  . Gastroparesis 2010  . Chronic obstructive asthma     PFT 11/06/10 - FEV1 1.24/ 0.62; FEV1/FVC 0.56, TLC  0.78; DLCO 0.75  . Allergic asthma     h/o  . Bronchiectasis     h/o  . Hypertension   . COPD (chronic obstructive pulmonary disease) ([place])   . Anxiety     Medications:  Anti-infectives    Start     Dose/Rate Route Frequency Ordered Stop   10/25/15 1100  vancomycin (VANCOCIN) IVPB 1000 mg/200 mL premix     1,000 mg 200 mL/hr over 60 Minutes Intravenous Every 24 hours 10/24/15 1032     10/24/15 1900  aztreonam (AZACTAM) 1 g in dextrose 5 % 50 mL IVPB     1 g 100 mL/hr over 30 Minutes Intravenous Every 8 hours 10/24/15 1032     10/24/15 1015  aztreonam (AZACTAM) 2 g in dextrose 5 % 50 mL IVPB     2 g 100 mL/hr over 30 Minutes Intravenous STAT 10/24/15 1014 10/25/15 1015   10/24/15 1015  vancomycin (VANCOCIN) 1,500 mg in sodium chloride 0.9 % 500 mL IVPB     1,500 mg 250 mL/hr over 120 Minutes Intravenous STAT 10/24/15 1014 10/25/15 1015   10/24/15 1000  oseltamivir (TAMIFLU) capsule 75 mg     75 mg Oral  Once 10/24/15 0950 10/24/15 1006     Assessment: 26 yof presented to the ED with SOB. To start empiric broad-spectrum antibiotics for possible pneumonia. Tmax is 103.7 but WBC is WNL. Scr is WNL and lactic acid is elevated at  2.66.   Vanc 12/13>> Aztreo 12/13>> Tamiflu x 1 12/13  Goal of Therapy:  Vancomycin trough level 15-20 mcg/ml  Plan:  - Vancomycin 1500mg  IV x 1 then 1gm IV Q24H - Aztreonam 2gm IV x 1 then 1gm IV Q8H - F/u renal fxn, C&S, clinical status and trough at [place], Rande Lawman 10/24/2015,10:33 AM

## 2015-10-24 NOTE — Discharge Instructions (Signed)
Please take your antibiotics as prescribed for your urinary tract infection. Please also avoid over-exerting yourself.   Take your regular medications as prescribed and follow up with [name] in the next few days.   If you would have any shortness of breath or difficulty breathing, fever, or feeling unwell, please return to the Emergency Department.

## 2015-10-24 NOTE — ED Notes (Signed)
Patient ambulated steady gait to bathroom and returned to room. Room air oxygen saturation 100%.
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
## 2015-10-24 NOTE — Progress Notes (Signed)
RT note - Pt refused ABG. MD and RN notified.

## 2015-10-24 NOTE — Progress Notes (Signed)
Patient arrived via [facility] transportation for a scheduled pulmonary rehab appointment.  Upon arrival to the hospital entrance she was complaining of SOB, had increased work of breathing and "did' not feel well.  Patient requested treatment in the ED.  Patient was transported safely to the ED for care.  RN to call if assistance needed.

## 2015-10-24 NOTE — ED Notes (Signed)
EKG completed given to EDP.  

## 2015-10-24 NOTE — ED Notes (Signed)
Onset today sudden shortness of breath came to ED from Pulmonary Rehab. Alert answering and following commands appropriate.

## 2015-10-25 NOTE — Telephone Encounter (Signed)
Let her know that Reviewed the ER record and for her to follow their insructons

## 2015-10-26 ENCOUNTER — Encounter (HOSPITAL_COMMUNITY): Payer: Self-pay

## 2015-10-26 LAB — URINE CULTURE: Culture: >=100000

## 2015-10-27 ENCOUNTER — Telehealth (HOSPITAL_COMMUNITY): Payer: Self-pay

## 2015-10-27 NOTE — Telephone Encounter (Signed)
Post ED Visit - Positive Culture Follow-up  Culture report reviewed by antimicrobial stewardship pharmacist:  [x]  Elenor Quinones, Pharm.D. []  Heide Guile, Pharm.D., BCPS []  Parks Neptune, Pharm.D. []  Alycia Rossetti, Pharm.D., BCPS []  Hines, Pharm.D., BCPS, AAHIVP []  Legrand Como, Pharm.D., BCPS, AAHIVP []  Milus Glazier, Pharm.D. []  Rob Evette Doffing, Pharm.D.  Positive urine culture, >/= 100,000 colonies -> Klebsiella Pneumoniae Treated with Ciprofloxacin, organism sensitive to the same and no further patient follow-up is required at this time.  Dortha Kern 10/27/2015, 9:21 AM

## 2015-10-29 LAB — CULTURE, BLOOD (ROUTINE X 2)
CULTURE: NO GROWTH
CULTURE: NO GROWTH

## 2015-10-30 ENCOUNTER — Ambulatory Visit: Payer: PRIVATE HEALTH INSURANCE

## 2015-10-31 ENCOUNTER — Encounter (HOSPITAL_COMMUNITY): Payer: Self-pay

## 2015-10-31 ENCOUNTER — Telehealth: Payer: Self-pay | Admitting: Adult Health

## 2015-10-31 NOTE — Telephone Encounter (Signed)
Patient called and requested an appointment to be seen tomorrow. She wanted an early morning appointment only, but we do not have anything for the morning.  Offered to schedule her an appointment in the afternoon and she said that she could not take an afternoon appointment.  Advised her that I could ask one of the providers if we can work her in, she said that she already has an appointment schedule for tomorrow morning with her PCP and that she will just keep that appointment.  Advised her to call back if she needs to be seen.

## 2015-11-01 ENCOUNTER — Telehealth: Payer: Self-pay | Admitting: Internal Medicine

## 2015-11-01 ENCOUNTER — Ambulatory Visit (INDEPENDENT_AMBULATORY_CARE_PROVIDER_SITE_OTHER): Payer: Medicare Other | Admitting: Internal Medicine

## 2015-11-01 ENCOUNTER — Other Ambulatory Visit (INDEPENDENT_AMBULATORY_CARE_PROVIDER_SITE_OTHER): Payer: Medicare Other

## 2015-11-01 ENCOUNTER — Encounter: Payer: Self-pay | Admitting: Internal Medicine

## 2015-11-01 ENCOUNTER — Ambulatory Visit (INDEPENDENT_AMBULATORY_CARE_PROVIDER_SITE_OTHER): Payer: Medicare Other

## 2015-11-01 VITALS — BP 150/80 | HR 58 | Temp 97.9°F | Resp 18 | Ht 63.5 in | Wt 170.0 lb

## 2015-11-01 DIAGNOSIS — I2510 Atherosclerotic heart disease of native coronary artery without angina pectoris: Secondary | ICD-10-CM | POA: Diagnosis not present

## 2015-11-01 DIAGNOSIS — I2584 Coronary atherosclerosis due to calcified coronary lesion: Secondary | ICD-10-CM | POA: Diagnosis not present

## 2015-11-01 DIAGNOSIS — E538 Deficiency of other specified B group vitamins: Secondary | ICD-10-CM | POA: Diagnosis not present

## 2015-11-01 DIAGNOSIS — R5383 Other fatigue: Secondary | ICD-10-CM

## 2015-11-01 DIAGNOSIS — E785 Hyperlipidemia, unspecified: Secondary | ICD-10-CM

## 2015-11-01 DIAGNOSIS — R531 Weakness: Secondary | ICD-10-CM | POA: Insufficient documentation

## 2015-11-01 DIAGNOSIS — Z5189 Encounter for other specified aftercare: Secondary | ICD-10-CM

## 2015-11-01 DIAGNOSIS — J4540 Moderate persistent asthma, uncomplicated: Secondary | ICD-10-CM

## 2015-11-01 DIAGNOSIS — R52 Pain, unspecified: Secondary | ICD-10-CM

## 2015-11-01 HISTORY — DX: Weakness: R53.1

## 2015-11-01 LAB — LIPID PANEL
CHOLESTEROL: 195 mg/dL (ref 0–200)
HDL: 35.8 mg/dL — AB (ref >39.00)
LDL Cholesterol: 137 mg/dL — ABNORMAL HIGH (ref 0–99)
NonHDL: 158.98
Total CHOL/HDL Ratio: 5
Triglycerides: 108 mg/dL (ref 0.0–149.0)
VLDL: 21.6 mg/dL (ref 0.0–40.0)

## 2015-11-01 LAB — COMPREHENSIVE METABOLIC PANEL
ALBUMIN: 3.5 g/dL (ref 3.5–5.2)
ALK PHOS: 93 U/L (ref 39–117)
ALT: 11 U/L (ref 0–35)
AST: 15 U/L (ref 0–37)
BILIRUBIN TOTAL: 0.3 mg/dL (ref 0.2–1.2)
BUN: 13 mg/dL (ref 6–23)
CO2: 27 mEq/L (ref 19–32)
Calcium: 9.5 mg/dL (ref 8.4–10.5)
Chloride: 105 mEq/L (ref 96–112)
Creatinine, Ser: 1.14 mg/dL (ref 0.40–1.20)
GFR: 60.14 mL/min (ref >60.00)
Glucose, Bld: 99 mg/dL (ref 70–99)
POTASSIUM: 3.7 meq/L (ref 3.5–5.1)
SODIUM: 140 meq/L (ref 135–145)
TOTAL PROTEIN: 7.7 g/dL (ref 6.0–8.3)

## 2015-11-01 LAB — TSH: TSH: 0.76 u[IU]/mL (ref 0.35–4.50)

## 2015-11-01 LAB — VITAMIN B12: Vitamin B-12: 464 pg/mL (ref 211–911)

## 2015-11-01 MED ORDER — OMALIZUMAB 150 MG ~~LOC~~ SOLR
150.0000 mg | Freq: Once | SUBCUTANEOUS | Status: AC
  Administered 2015-11-01: 150 mg via SUBCUTANEOUS

## 2015-11-01 NOTE — Patient Instructions (Signed)
It is okay to take the xolair shot today.   We are checking labs today and will call you back with the results.   We are also checking several labs to see if we can find a cause for the tiredness.   We are checking on the referral to pain management but since we are not a medicaid provider that does slow down that referral.

## 2015-11-01 NOTE — Telephone Encounter (Signed)
I did not call patient.

## 2015-11-01 NOTE — Assessment & Plan Note (Signed)
No flare today, lungs sound clear. Advised her to go to pulmonary rehab to increase her endurance and help with energy. Okay to get xolair today.

## 2015-11-01 NOTE — Telephone Encounter (Signed)
Did you call [name] [name]?  She states someone called her after she left here but she can not make the voicemail out.

## 2015-11-01 NOTE — Assessment & Plan Note (Signed)
Checking TSH and B12 today for the fatigue. Advised her to try pulmonary rehab to see if this helps some with her endurance.

## 2015-11-01 NOTE — Progress Notes (Signed)
   Subjective:    Patient ID: [name] [name], female    DOB: February 23, 1941, 74 y.o.   MRN: [id number]  HPI The patient sis a 74 YO female here for follow up of her left knee pain. She was taken off her narcotics due to multiple negative UDS screens. She was advised by me that we will not prescribe controlled substances to her. She was given voltaren gel at last visit which she is not sure if she has tried. She is frustrated that her referral to pain clinic is not happening. She is also following about an ER visit where she was treated for UTI. She did take some of the medicine and is now symptom free but feels like she had a reaction so did not finish the cipro.   Review of Systems  Constitutional: Positive for fatigue. Negative for fever, activity change, appetite change and unexpected weight change.  Respiratory: Negative for cough, chest tightness, shortness of breath and wheezing.   Cardiovascular: Negative for chest pain, palpitations and leg swelling.  Gastrointestinal: Negative for nausea, abdominal pain, diarrhea, constipation and abdominal distention.  Musculoskeletal: Positive for arthralgias. Negative for myalgias, back pain and neck pain.  Skin: Positive for rash. Negative for color change and wound.  Neurological: Negative.   Psychiatric/Behavioral: Negative.       Objective:   Physical Exam  Constitutional: She is oriented to person, place, and time. She appears well-developed and well-nourished.  HENT:  Head: Normocephalic and atraumatic.  Eyes: EOM are normal.  Neck: Normal range of motion.  Cardiovascular: Normal rate and regular rhythm.   Pulmonary/Chest: Effort normal and breath sounds normal.  Abdominal: Soft.  Musculoskeletal: She exhibits no edema.  Neurological: She is alert and oriented to person, place, and time.  Skin: Skin is warm and dry.   Filed Vitals:   11/01/15 0920  BP: 160/72  Pulse: 58  Temp: 97.9 F (36.6 C)  TempSrc: Oral  Resp: 18  Height: 5'  3.5" (1.613 m)  Weight: 170 lb (77.111 kg)  SpO2: 98%      Assessment & Plan:

## 2015-11-01 NOTE — Progress Notes (Signed)
Pre visit review using our clinic review tool, if applicable. No additional management support is needed unless otherwise documented below in the visit note. 

## 2015-11-01 NOTE — Assessment & Plan Note (Signed)
Reminded her that we cannot prescribe controlled substances and advised her to try the voltaren. Also advised that we are working on the referral but due to her medicaid this will likely take some time since we are not a medicaid provider. Explained that to her again today.

## 2015-11-02 ENCOUNTER — Encounter (HOSPITAL_COMMUNITY): Payer: Self-pay

## 2015-11-07 ENCOUNTER — Encounter (HOSPITAL_COMMUNITY): Payer: Self-pay

## 2015-11-09 ENCOUNTER — Encounter (HOSPITAL_COMMUNITY): Payer: Self-pay

## 2015-11-14 ENCOUNTER — Encounter (HOSPITAL_COMMUNITY)
Admission: RE | Admit: 2015-11-14 | Discharge: 2015-11-14 | Disposition: A | Discharge status: Home / Self Care | Payer: Self-pay | Source: Ambulatory Visit | Attending: Pulmonary Disease | Admitting: Pulmonary Disease

## 2015-11-14 DIAGNOSIS — M810 Age-related osteoporosis without current pathological fracture: Secondary | ICD-10-CM | POA: Insufficient documentation

## 2015-11-14 DIAGNOSIS — Z5189 Encounter for other specified aftercare: Secondary | ICD-10-CM | POA: Insufficient documentation

## 2015-11-14 DIAGNOSIS — J45909 Unspecified asthma, uncomplicated: Secondary | ICD-10-CM | POA: Insufficient documentation

## 2015-11-14 DIAGNOSIS — G4730 Sleep apnea, unspecified: Secondary | ICD-10-CM | POA: Insufficient documentation

## 2015-11-14 DIAGNOSIS — J449 Chronic obstructive pulmonary disease, unspecified: Secondary | ICD-10-CM | POA: Insufficient documentation

## 2015-11-14 DIAGNOSIS — K219 Gastro-esophageal reflux disease without esophagitis: Secondary | ICD-10-CM | POA: Insufficient documentation

## 2015-11-15 ENCOUNTER — Telehealth: Payer: Self-pay | Admitting: Internal Medicine

## 2015-11-15 DIAGNOSIS — H43813 Vitreous degeneration, bilateral: Secondary | ICD-10-CM | POA: Diagnosis not present

## 2015-11-15 DIAGNOSIS — H40013 Open angle with borderline findings, low risk, bilateral: Secondary | ICD-10-CM | POA: Diagnosis not present

## 2015-11-15 DIAGNOSIS — Z961 Presence of intraocular lens: Secondary | ICD-10-CM | POA: Diagnosis not present

## 2015-11-15 NOTE — Telephone Encounter (Signed)
#   vials:2 Ordered 2,  I was thinking 2 instead of one when I ordered. Ordered date:11/15/15 Shipping Date:11/16/15

## 2015-11-16 ENCOUNTER — Encounter (HOSPITAL_COMMUNITY)
Admission: RE | Admit: 2015-11-16 | Discharge: 2015-11-16 | Disposition: A | Discharge status: Home / Self Care | Payer: Self-pay | Source: Ambulatory Visit | Attending: Pulmonary Disease | Admitting: Pulmonary Disease

## 2015-11-20 DIAGNOSIS — Y93A9 Activity, other involving cardiorespiratory exercise: Secondary | ICD-10-CM

## 2015-11-20 NOTE — Patient Instructions (Signed)
Attended a Chair Yoga class.

## 2015-11-20 NOTE — Congregational Nurse Program (Signed)
Congregational Nurse Program Note  Date of Encounter: 11/20/2015  Past Medical History: Past Medical History  Diagnosis Date  . Sleep apnea   . ALLERGIC RHINITIS   . Colon polyp, hyperplastic 02/2003, 03/2014  . Chronic rhinosinusitis   . Hyperlipidemia   . Osteoporosis   . GERD (gastroesophageal reflux disease)   . Hiatal hernia   . Helicobacter pylori gastritis 02/2009    partially treated  . Gastroparesis 2010  . Chronic obstructive asthma     PFT 11/06/10 - FEV1 1.24/ 0.62; FEV1/FVC 0.56, TLC 0.78; DLCO 0.75  . Allergic asthma     h/o  . Bronchiectasis     h/o  . Hypertension   . COPD (chronic obstructive pulmonary disease) ([place])   . Anxiety     Encounter Details:     CNP Questionnaire - 11/20/15 1622    Patient Demographics   Is this a new or existing patient? Existing   Patient is considered a/an Not Applicable   Race African-American/Black   Patient Assistance   Location of Patient Assistance Not Applicable   Patient's financial/insurance status Medicare   Uninsured Patient No   Patient referred to apply for the following financial assistance Not Applicable   Transportation assistance No   Assistance securing medications No   Educational health offerings Acute disease;Exercise/physical activity   Encounter Details   Primary purpose of visit Education/Health Concerns   Was an Emergency Department visit averted? No   Does patient have a medical provider? Yes   Was a mental health screening completed? (GAINS tool) No   Does patient have dental issues? No   Since previous encounter, have you referred patient for abnormal blood pressure that resulted in a new diagnosis or medication change? No   Since previous encounter, have you referred patient for abnormal blood glucose that resulted in a new diagnosis or medication change? No   For Abstraction Use Only   Does patient have insurance? Yes       Attended chair yoga class.

## 2015-11-21 ENCOUNTER — Encounter (HOSPITAL_COMMUNITY): Payer: Self-pay

## 2015-11-21 NOTE — Telephone Encounter (Signed)
#   Vials:1 I couldn't find the paper at the time Joellen Jersey told me we only ordered 7. We only needed 6. Per Joellen Jersey I will give the extra one to the next [facility] pt, that only needs one xolair. Arrival Date:11/21/15  Lot [id number] Exp Date:6/20

## 2015-11-22 ENCOUNTER — Other Ambulatory Visit: Payer: Self-pay | Admitting: Internal Medicine

## 2015-11-23 ENCOUNTER — Encounter (HOSPITAL_COMMUNITY)
Admission: RE | Admit: 2015-11-23 | Discharge: 2015-11-23 | Disposition: A | Discharge status: Home / Self Care | Payer: Self-pay | Source: Ambulatory Visit | Attending: Pulmonary Disease | Admitting: Pulmonary Disease

## 2015-11-28 ENCOUNTER — Encounter (HOSPITAL_COMMUNITY): Payer: Self-pay

## 2015-11-29 ENCOUNTER — Ambulatory Visit: Payer: PRIVATE HEALTH INSURANCE

## 2015-11-30 ENCOUNTER — Ambulatory Visit (INDEPENDENT_AMBULATORY_CARE_PROVIDER_SITE_OTHER): Payer: Medicare Other

## 2015-11-30 ENCOUNTER — Encounter (HOSPITAL_COMMUNITY): Payer: Self-pay

## 2015-11-30 DIAGNOSIS — J4520 Mild intermittent asthma, uncomplicated: Secondary | ICD-10-CM | POA: Diagnosis not present

## 2015-12-01 ENCOUNTER — Encounter: Payer: PRIVATE HEALTH INSURANCE | Admitting: Internal Medicine

## 2015-12-01 MED ORDER — OMALIZUMAB 150 MG ~~LOC~~ SOLR
150.0000 mg | Freq: Once | SUBCUTANEOUS | Status: AC
  Administered 2015-12-01: 150 mg via SUBCUTANEOUS

## 2015-12-05 ENCOUNTER — Encounter (HOSPITAL_COMMUNITY)
Admission: RE | Admit: 2015-12-05 | Discharge: 2015-12-05 | Disposition: A | Discharge status: Home / Self Care | Payer: Self-pay | Source: Ambulatory Visit | Attending: Pulmonary Disease | Admitting: Pulmonary Disease

## 2015-12-05 ENCOUNTER — Telehealth: Payer: Self-pay | Admitting: Internal Medicine

## 2015-12-05 NOTE — Telephone Encounter (Signed)
Spoke with pt. States that Memory Dance is not working well for her. Memory Dance is causing her to breathing to be worse, headaches and nose bleeds. Would like to go back on Symbicort. The pt was not in distress so this message will not be sent urgent.  TP - please advise in MR's absence. Thanks.

## 2015-12-05 NOTE — Telephone Encounter (Signed)
Spoke with pt, aware of recs.  Pt states she does not need a new rx for symbicort at this time as she still has med.  Nothing further needed.

## 2015-12-05 NOTE — Telephone Encounter (Signed)
That is fine to change back to Symibcort Keep follow up ov  Please contact office for sooner follow up if symptoms do not improve or worsen or seek emergency care

## 2015-12-07 ENCOUNTER — Encounter (HOSPITAL_COMMUNITY)
Admission: RE | Admit: 2015-12-07 | Discharge: 2015-12-07 | Disposition: A | Discharge status: Home / Self Care | Payer: Self-pay | Source: Ambulatory Visit | Attending: Pulmonary Disease | Admitting: Pulmonary Disease

## 2015-12-12 ENCOUNTER — Encounter (HOSPITAL_COMMUNITY): Payer: Self-pay

## 2015-12-13 ENCOUNTER — Telehealth: Payer: Self-pay | Admitting: Internal Medicine

## 2015-12-13 NOTE — Telephone Encounter (Signed)
#   vials:1 Ordered date:12/13/15 Shipping Date:12/14/15

## 2015-12-14 ENCOUNTER — Encounter (HOSPITAL_COMMUNITY)
Admission: RE | Admit: 2015-12-14 | Discharge: 2015-12-14 | Disposition: A | Discharge status: Home / Self Care | Payer: Self-pay | Source: Ambulatory Visit | Attending: Pulmonary Disease | Admitting: Pulmonary Disease

## 2015-12-14 DIAGNOSIS — G4730 Sleep apnea, unspecified: Secondary | ICD-10-CM | POA: Insufficient documentation

## 2015-12-14 DIAGNOSIS — J45909 Unspecified asthma, uncomplicated: Secondary | ICD-10-CM | POA: Insufficient documentation

## 2015-12-14 DIAGNOSIS — M810 Age-related osteoporosis without current pathological fracture: Secondary | ICD-10-CM | POA: Insufficient documentation

## 2015-12-14 DIAGNOSIS — K219 Gastro-esophageal reflux disease without esophagitis: Secondary | ICD-10-CM | POA: Insufficient documentation

## 2015-12-14 DIAGNOSIS — Z5189 Encounter for other specified aftercare: Secondary | ICD-10-CM | POA: Insufficient documentation

## 2015-12-14 DIAGNOSIS — J449 Chronic obstructive pulmonary disease, unspecified: Secondary | ICD-10-CM | POA: Insufficient documentation

## 2015-12-14 NOTE — Telephone Encounter (Signed)
#   Vials:1 Arrival Date:12/14/15 Lot [id number] Exp Date:6/20

## 2015-12-19 ENCOUNTER — Other Ambulatory Visit: Payer: Self-pay | Admitting: Internal Medicine

## 2015-12-19 ENCOUNTER — Encounter (HOSPITAL_COMMUNITY): Payer: Self-pay

## 2015-12-19 DIAGNOSIS — R0600 Dyspnea, unspecified: Secondary | ICD-10-CM

## 2015-12-20 ENCOUNTER — Ambulatory Visit (INDEPENDENT_AMBULATORY_CARE_PROVIDER_SITE_OTHER): Payer: Medicare Other | Admitting: Internal Medicine

## 2015-12-20 ENCOUNTER — Encounter: Payer: Self-pay | Admitting: Internal Medicine

## 2015-12-20 VITALS — BP 152/88 | HR 66 | Ht 64.0 in | Wt 169.0 lb

## 2015-12-20 DIAGNOSIS — J4540 Moderate persistent asthma, uncomplicated: Secondary | ICD-10-CM

## 2015-12-20 DIAGNOSIS — R0600 Dyspnea, unspecified: Secondary | ICD-10-CM

## 2015-12-20 LAB — PULMONARY FUNCTION TEST
DL/VA % pred: 104 %
DL/VA: 4.6 ml/min/mmHg/L
DLCO UNC: 18.01 ml/min/mmHg
DLCO unc % pred: 89 %
FEF 25-75 PRE: 1.28 L/s
FEF 25-75 Post: 1.45 L/sec
FEF2575-%CHANGE-POST: 13 %
FEF2575-%PRED-POST: 105 %
FEF2575-%Pred-Pre: 92 %
FEV1-%CHANGE-POST: 4 %
FEV1-%PRED-POST: 111 %
FEV1-%PRED-PRE: 107 %
FEV1-POST: 1.67 L
FEV1-Pre: 1.61 L
FEV1FVC-%Change-Post: 3 %
FEV1FVC-%PRED-PRE: 97 %
FEV6-%Change-Post: 0 %
FEV6-%PRED-POST: 115 %
FEV6-%Pred-Pre: 115 %
FEV6-POST: 2.15 L
FEV6-PRE: 2.14 L
FEV6FVC-%PRED-POST: 105 %
FEV6FVC-%PRED-PRE: 105 %
FVC-%CHANGE-POST: 0 %
FVC-%PRED-POST: 110 %
FVC-%PRED-PRE: 109 %
FVC-POST: 2.15 L
FVC-PRE: 2.14 L
PRE FEV6/FVC RATIO: 100 %
Post FEV1/FVC ratio: 78 %
Post FEV6/FVC ratio: 100 %
Pre FEV1/FVC ratio: 75 %
RV % PRED: 111 %
RV: 2.34 L
TLC % pred: 102 %
TLC: 4.72 L

## 2015-12-20 NOTE — Patient Instructions (Signed)
ICD-9-CM ICD-10-CM   1. Asthma, moderate persistent, uncomplicated 123456 [id number]      Stable disesase. Well controlled 12/20/2015  Plan Continue current medication regimen  Follow-up - 6 months or sooner if needed

## 2015-12-20 NOTE — Progress Notes (Signed)
Subjective:     Patient ID: [name] [name], female   DOB: 08-Dec-1940, 75 y.o.   MRN: [id number]  HPI   1. ASTHMA - Baseline Chronic High Risk (multiple admits), Allergic, Moderate-Severe Persistent Asthma  - Allergies, gerd, sinus exacerbators.  - Frequent prednisone -> daily low dose prednisone since FEb 20112  - PFT 11/06/2010 - Mixed obstruction - restriction - fev1 1L/50%, Ratio 54, 24% BD response, TLC 3.6/78%, DLCO 16/75%  - May 2012: Switched asthma care to [name]  - June 2012 PFT - showed normalization with prednisone: PFts 04/18/2011 shows NORMALIZATION (fev1 1.8L/93%, ratio 68, 9% BD response, DLCO 86).  - August 2012: Fev1: 1.1lL/58% -> prednisone burst-> 1.09L/60%, Ratio 65 (no  - Nov 2012: Fev1 0.97L/51%, Ratio 60 -> pred burst  - Dec 2012: Fev1 0.7L/37%, Ratio 67%  - August 2014: FEV1  1.17 L/62% - February 2016: FEV1 1.34/72%, ratio 71  2. Bronchiectasis, (mild RUL) seen on CT chest - April 2012 ( ABPA wu tests on prednisone in June 2012: asp antibody IgE asp fumigatus 0.24 ku/L which is borderline/equivocal, IgG for all all aspergiluus preciptins - NEGATIVE. 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 Allergic rhinitis and allergic asthma.  - 2000s: - per hx strongly skin test positive at Dr Neldon Mc office  - 2004 - trialed xolair - per hx effective but developed rash which she subjectively attributed to twice daily dosing (thogh chart review 2004-2005 states it was stopped due to subjective lack of improvement and an incidental rash resolution was noted in retrospect after stopping xolair)  - 2009: IgE 306 in July 2009. RAST positive for various grass, oak, elm, ragweed, plantain, lamb etc., s/p Allergy shot trial with Dr Annamaria Boots - stopped due to transport issues  - 2012: May - IgE 588.  - 2012: June - moved allergy MD to Dr Hardie Pulley  - 2013: Restart Xolair  - 2016 (early) - half dose xolair   4. Lung  nodule, 6 mm, on CT scan. - April 2012. Possibly calcified. RUL (no prior for comparison but a 07/17/2000 CT chest reports simillar size nodule in similar location). Repeat CT needed April 2013   5. Gastroesophageal reflux disease with hx of UGI scope showing Hiatal hernia (small)  - delayed gastric empyting 2010 March  6. History of sleep apnea.- non compliant with CPAP   7. Significant anxiety (also mood lability hx with prednisone, worsening anxiety with albuterol)    8. Recurrent AE-ASthma  - Admitted 4/23-4/30/12  - OPD AE asthma Rx: July 2012, august 2012, Nov 2012  - Office treatment with Levaquin and prednisone-November 2013 -  Levaquin and prednisone -January 2014   #Obesity  - 192# - nov 2013. STart low glycemic diet  - 178#  - 03/10/2013. Body mass index is 31.03 kg/(m^2). - 175# - 04/08/13 - 168# - 05/12/2013 with Body mass index is 28.93 kg/(m^2). - 167# on 07/07/2013 with Body mass index is 28.65 kg/(m^2). -  154# on 10/18/2013 wuth Body mass index is 26.42 kg/(m^2).  - Body mass index is 27.79 kg/(m^2). on 07/22/2014 - Body mass index is 27.79 kg/(m^2). - 03/31/15    OV 03/31/2015  Chief Complaint  Patient presents with  . Follow-up    Pt recently seen by TP on 03/23/15 for an acute visit. Pt states she is feeling better but not at baseline. Pt c/o prod cough with light yellow mucus- pt  stated the color is lightening. Pt c/o intermittent chest tightness.     Follow-up severe persistent asthma with question of noncompliance and elevated chronic peripheral eosinophils  She came off chronic prednisone a month ago after we discussed and made this is a mutual plan. She saw my nurse practitioner one week ago and was an exacerbation presumably because of a defective Symbicort sample. Prednisone only helped a little bit but she feels she is back in exacerbation again presumably because of change in weather. She is wheezing more coughing more and has more chest tightness. She does  not want to try Xopenex anymore but instead wants to try the new pro-air click   Exhaled nitric oxide is 41 ppb and is significantly elevated today  REview of labs shows- chronic peripheral eosinophilia - 800cells to 1100 cells/cu.mm betweeen sept 2013 and June 2015   OV 06/05/2015  Chief Complaint  Patient presents with  . Follow-up    Pt states she has good days and bad days but has increase in SOB with hot weather. Pt denies significant cough and CP/tightness.    Follow-up moderate to severe asthma  - She is now on daily prednisone. Last time I saw her she was in exacerbation we treated her with prednisone burst. Since then she has significant improvement. Exhaled nitric oxide today's 24 ppb and show significant improvement in asthma. Asthma control questionnaire shows symptoms with an average score of 2.2 which means she still symptomatic in the last 1 week. Specifically she's having asthma symptoms at least a few times during the night and very mild symptoms when she wakes up in the morning and she is moderately limited with her activities because of asthma and a moderate amount of shortness of breath because of asthma and she's wheezing a little of the time and using albuterol inhaler at least 2-4 puffs as needed each day.  Labs  -03/31/2015 ANCA screen was trace positive but specific antibopdies negative. So overall normal result for presence of vasculitis. She has eosinophilia but is alwaus< 1100 so I do not think she has vasculitis. But the last eosinophil count was in 2015 June    - FeNO 06/05/2015 is 24ppb    Past medical history  - She reports frustration with her primary care physician Gwendolyn Grant, MD who apparently discontinued her OP avoid which she was taking when necessary and offered a Xanax which she does not want to do. This is because of a negative urine screen for when necessary. Patient says she's not diverting the drug  OV 09/18/2015  Chief Complaint   Patient presents with  . Follow-up    Pt c/o slight increase in SOB, dry cough, PND x 2 days. Pt c/o midsternal CP during the night. Pt denies f/c/s.     Follow-up moderate persistent asthma. No longer on chronic steroids. She is on Symbicort, Singulair and Xolair.  At last visit she deferred  Nucala insulin Xolair. Overall asthma stable. However-symptom burden continues. Asthma control 5. questionnaire shows symptoms score of 2.0 but then she also tells me that this is worse in the last few weeks even though compared to prior visits the score is the same. Specifically at night she wakes up a few times because of asthma. In the morning when she wakes up she is very mild symptoms. She is moderately limited in her activities because of asthma and she experiences moderate shortness of breath because of asthma. She is wheezing a little of the time and is  using albuterol for rescue 3-4 puffs most days. On the contrary exhaled nitric oxide today is normal at 19 ppb. Nevertheless she feels that she was benefiting in the past from steroids and she feels she needs the same again. She feels she is in an exacerbation.  Other issues  - She feels that we are over billing her from it Xolair injections and she wants to talk to manager  - She is frustrated by her chronic pain management and the fact that her primary care physician will be leaving the practice soon. She is frustrated that primary care office will not refill her pain medications. She is agreeable to go to a pain clinic but says she has not been referred to one. She wants me to talk to her primary care physician Gwendolyn Grant, MD    Ov 12/20/2015  Chief Complaint  Patient presents with  . Follow-up    Pt here after PFT. Pt states her breathing has been doing better. Pt states the Breo irritated her sinuses and is now back on Breo. Pt states she is still having the persistent cough during the night and is keeping her awake. Pt denies  CP/tightness.    Follow-up moderate persistent asthma. No longer on chronic steroids. She is on Symbicort, Singulair and Xolair.   10/24/2015 she ended up in the emergency room with a fever her lactic acid on review was 2.66 but resolved with fluids to 1.69. Her kidney function was normal. Her CBC was normal. Chest x-ray was reported as normal left personally visualized this film and agree with the findings. Since then she's been doing well. Today's routine asthma follow-up. Other than some nocturnal cough she is doing well. She has some nonspecific chronic abdominal cramps. Super he function test done today is normal. I personally visualized this film. Off note she has a new primary care physician   Current outpatient prescriptions:  .  amLODipine (NORVASC) 5 MG tablet, Take 1 tablet (5 mg total) by mouth daily., Disp: 90 tablet, Rfl: 3 .  budesonide-formoterol (SYMBICORT) 160-4.5 MCG/ACT inhaler, Inhale 2 puffs into the lungs 2 (two) times daily., Disp: , Rfl:  .  cycloSPORINE (RESTASIS) 0.05 % ophthalmic emulsion, Place 1 drop into both eyes 2 (two) times daily., Disp: 60 mL, Rfl: 2 .  desloratadine (CLARINEX) 5 MG tablet, Take 1 tablet (5 mg total) by mouth daily., Disp: 90 tablet, Rfl: 3 .  EPINEPHrine (EPIPEN 2-PAK) 0.3 mg/0.3 mL IJ SOAJ injection, Inject 0.3 mLs (0.3 mg total) into the muscle once., Disp: 2 Device, Rfl: 1 .  esomeprazole (NEXIUM) 40 MG capsule, Take 1 capsule (40 mg total) by mouth daily at 12 noon., Disp: 90 capsule, Rfl: 3 .  montelukast (SINGULAIR) 10 MG tablet, Take 1 tablet (10 mg total) by mouth at bedtime., Disp: 90 tablet, Rfl: 3 .  olopatadine (PATANOL) 0.1 % ophthalmic solution, Place 1 drop into both eyes 2 (two) times daily. Appt with PCP needed for additional refills., Disp: 5 mL, Rfl: 1 .  omalizumab (XOLAIR) 150 MG injection, Inject 300 mg into the skin every 28 (twenty-eight) days., Disp: , Rfl:  .  Vitamin D, Ergocalciferol, (DRISDOL) 50000 UNITS CAPS  capsule, TAKE 1 CAPSULE (50,000 UNITS TOTAL) BY MOUTH EVERY 7 (SEVEN) DAYS., Disp: 12 capsule, Rfl: 3 .  XOPENEX 0.63 MG/3ML nebulizer solution, Take 3 mLs (0.63 mg total) by nebulization every 6 (six) hours as needed for wheezing or shortness of breath. Dx J45.50, Disp: 360 mL, Rfl: 0 .  XOPENEX HFA 45 MCG/ACT inhaler, INHALE 2 PUFFS INTO THE LUNGS EVERY 4 HOURS AS NEEDED FOR WHEEZING OR SHORTNESS OF BREATH., Disp: 15 Inhaler, Rfl: 2  Immunization History  Administered Date(s) Administered  . Pneumococcal Conjugate-13 09/20/2013  . Pneumococcal Polysaccharide-23 12/25/2010  . Td 01/09/2005    Allergies  Allergen Reactions  . 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 Latex Anaphylaxis  . Advair Diskus [Fluticasone-Salmeterol]     Tingling in mouth/ears ringing  . Diclofenac Sodium     REACTION: Hives  . Doxycycline Nausea And Vomiting  . Dulera [Mometasone Furo-Formoterol Fum]     HA  . Penicillins     Tongue swelling  . Sulfonamide Derivatives     Tongue swelling   . Tiotropium Bromide Monohydrate     Tongue/mouth itching Dysuria   . Albuterol Anxiety    Switched to xopenex     reports that she quit smoking about 53 years ago. Her smoking use included Cigarettes. She quit after 3 years of use. She has never used smokeless tobacco.    Review of Systems     Objective:   Physical Exam  Constitutional: She is oriented to person, place, and time. She appears well-developed and well-nourished. No distress.  HENT:  Head: Normocephalic and atraumatic.  Right Ear: External ear normal.  Left Ear: External ear normal.  Mouth/Throat: Oropharynx is clear and moist. No oropharyngeal exudate.  Eyes: Conjunctivae and EOM are normal. Pupils are equal, round, and reactive to light. Right eye exhibits no discharge. Left eye exhibits no discharge. No scleral icterus.  Neck: Normal range of motion. Neck supple. No JVD present. No tracheal deviation present. No  thyromegaly present.  Cardiovascular: Normal rate, regular rhythm, normal heart sounds and intact distal pulses.  Exam reveals no gallop and no friction rub.   No murmur heard. Pulmonary/Chest: Effort normal and breath sounds normal. No respiratory distress. She has no wheezes. She has no rales. She exhibits no tenderness.  Abdominal: Soft. Bowel sounds are normal. She exhibits no distension and no mass. There is no tenderness. There is no rebound and no guarding.  Musculoskeletal: Normal range of motion. She exhibits no edema or tenderness.  Lymphadenopathy:    She has no cervical adenopathy.  Neurological: She is alert and oriented to person, place, and time. She has normal reflexes. No cranial nerve deficit. She exhibits normal muscle tone. Coordination normal.  Skin: Skin is warm and dry. No rash noted. She is not diaphoretic. No erythema. No pallor.  Psychiatric: She has a normal mood and affect. Her behavior is normal. Judgment and thought content normal.  Vitals reviewed.    Filed Vitals:   12/20/15 1352  BP: 152/88  Pulse: 66  Height: 5\' 4"  (1.626 m)  Weight: 169 lb (76.658 kg)  SpO2: 98%       Assessment:       ICD-9-CM ICD-10-CM   1. Asthma, moderate persistent, uncomplicated 123456 [id number]     Well-controlled stable disease    Plan:     Continue current medication regimen of Symbicort, Singulair and Xolair and albuterol as needed   Dr. Brand Males, M.D., [facility].C.P Pulmonary and Critical Care Medicine Staff Physician [facility] Pager: [phone], If no answer or between  15:00h - 7:00h: call [phone]  12/20/2015 2:23 PM

## 2015-12-20 NOTE — Progress Notes (Signed)
PFT done today. 

## 2015-12-21 ENCOUNTER — Encounter (HOSPITAL_COMMUNITY)
Admission: RE | Admit: 2015-12-21 | Discharge: 2015-12-21 | Disposition: A | Discharge status: Home / Self Care | Payer: Self-pay | Source: Ambulatory Visit | Attending: Pulmonary Disease | Admitting: Pulmonary Disease

## 2015-12-28 ENCOUNTER — Ambulatory Visit (INDEPENDENT_AMBULATORY_CARE_PROVIDER_SITE_OTHER): Payer: Medicare Other

## 2015-12-28 ENCOUNTER — Encounter (HOSPITAL_COMMUNITY)
Admission: RE | Admit: 2015-12-28 | Discharge: 2015-12-28 | Disposition: A | Discharge status: Home / Self Care | Payer: Self-pay | Source: Ambulatory Visit | Attending: Pulmonary Disease | Admitting: Pulmonary Disease

## 2015-12-28 DIAGNOSIS — J309 Allergic rhinitis, unspecified: Secondary | ICD-10-CM

## 2015-12-28 DIAGNOSIS — J4550 Severe persistent asthma, uncomplicated: Secondary | ICD-10-CM

## 2015-12-29 ENCOUNTER — Encounter: Payer: Medicare Other | Attending: Physical Medicine & Rehabilitation | Admitting: Physical Medicine & Rehabilitation

## 2015-12-29 DIAGNOSIS — J4550 Severe persistent asthma, uncomplicated: Secondary | ICD-10-CM

## 2015-12-29 MED ORDER — OMALIZUMAB 150 MG ~~LOC~~ SOLR
150.0000 mg | Freq: Once | SUBCUTANEOUS | Status: AC
  Administered 2015-12-29: 150 mg via SUBCUTANEOUS

## 2016-01-02 ENCOUNTER — Telehealth: Payer: Self-pay | Admitting: Internal Medicine

## 2016-01-02 ENCOUNTER — Encounter (HOSPITAL_COMMUNITY): Payer: Self-pay | Attending: Pulmonary Disease

## 2016-01-02 DIAGNOSIS — K219 Gastro-esophageal reflux disease without esophagitis: Secondary | ICD-10-CM | POA: Insufficient documentation

## 2016-01-02 DIAGNOSIS — J45909 Unspecified asthma, uncomplicated: Secondary | ICD-10-CM | POA: Insufficient documentation

## 2016-01-02 DIAGNOSIS — Z5189 Encounter for other specified aftercare: Secondary | ICD-10-CM | POA: Insufficient documentation

## 2016-01-02 DIAGNOSIS — M810 Age-related osteoporosis without current pathological fracture: Secondary | ICD-10-CM | POA: Insufficient documentation

## 2016-01-02 DIAGNOSIS — J449 Chronic obstructive pulmonary disease, unspecified: Secondary | ICD-10-CM | POA: Insufficient documentation

## 2016-01-02 DIAGNOSIS — G4730 Sleep apnea, unspecified: Secondary | ICD-10-CM | POA: Insufficient documentation

## 2016-01-02 MED ORDER — OSELTAMIVIR PHOSPHATE 75 MG PO CAPS: 75.0000 mg | ORAL_CAPSULE | Freq: Two times a day (BID) | ORAL | Status: DC

## 2016-01-02 MED ORDER — AZITHROMYCIN 250 MG PO TABS: ORAL_TABLET | ORAL | Status: DC

## 2016-01-02 NOTE — Telephone Encounter (Signed)
Possible flu or flu like illness despite flu shot Recommend  - tamiflu 75mg  bid x 5 days  - Z pak   Allergies  Allergen Reactions  . 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 Latex Anaphylaxis  . Advair Diskus [Fluticasone-Salmeterol]     Tingling in mouth/ears ringing  . Diclofenac Sodium     REACTION: Hives  . Doxycycline Nausea And Vomiting  . Dulera [Mometasone Furo-Formoterol Fum]     HA  . Penicillins     Tongue swelling  . Sulfonamide Derivatives     Tongue swelling   . Tiotropium Bromide Monohydrate     Tongue/mouth itching Dysuria   . Albuterol Anxiety    Switched to xopenex

## 2016-01-02 NOTE — Telephone Encounter (Signed)
Called home #-line rang numerous times, NA and no VM Called mobile #. Spoke with pt. Pt is requesting ABX. She c/o runny nose, bilateral ear pain, chest cong, lots of coughing but it is dry, nasal cong, PND Fever last night but none today. Denies any wheezing, no chest tx.  Wants something called in. Please advise MR thanks

## 2016-01-02 NOTE — Telephone Encounter (Signed)
Called spoke with pt. Aware of recs below. RX's sent in. Nothing further needed 

## 2016-01-03 ENCOUNTER — Ambulatory Visit: Payer: PRIVATE HEALTH INSURANCE | Admitting: Pulmonary Disease

## 2016-01-04 ENCOUNTER — Encounter (HOSPITAL_COMMUNITY): Payer: Self-pay

## 2016-01-09 ENCOUNTER — Encounter (HOSPITAL_COMMUNITY): Payer: Self-pay

## 2016-01-09 DIAGNOSIS — Z1212 Encounter for screening for malignant neoplasm of rectum: Secondary | ICD-10-CM | POA: Diagnosis not present

## 2016-01-09 DIAGNOSIS — K210 Gastro-esophageal reflux disease with esophagitis: Secondary | ICD-10-CM | POA: Diagnosis not present

## 2016-01-09 DIAGNOSIS — J4520 Mild intermittent asthma, uncomplicated: Secondary | ICD-10-CM | POA: Diagnosis not present

## 2016-01-09 DIAGNOSIS — Z6830 Body mass index (BMI) 30.0-30.9, adult: Secondary | ICD-10-CM | POA: Diagnosis not present

## 2016-01-09 DIAGNOSIS — M8580 Other specified disorders of bone density and structure, unspecified site: Secondary | ICD-10-CM | POA: Diagnosis not present

## 2016-01-11 ENCOUNTER — Encounter (HOSPITAL_COMMUNITY): Payer: Self-pay

## 2016-01-11 DIAGNOSIS — K219 Gastro-esophageal reflux disease without esophagitis: Secondary | ICD-10-CM | POA: Insufficient documentation

## 2016-01-11 DIAGNOSIS — G4730 Sleep apnea, unspecified: Secondary | ICD-10-CM | POA: Insufficient documentation

## 2016-01-11 DIAGNOSIS — Z5189 Encounter for other specified aftercare: Secondary | ICD-10-CM | POA: Insufficient documentation

## 2016-01-11 DIAGNOSIS — J449 Chronic obstructive pulmonary disease, unspecified: Secondary | ICD-10-CM | POA: Insufficient documentation

## 2016-01-11 DIAGNOSIS — M810 Age-related osteoporosis without current pathological fracture: Secondary | ICD-10-CM | POA: Insufficient documentation

## 2016-01-11 DIAGNOSIS — J45909 Unspecified asthma, uncomplicated: Secondary | ICD-10-CM | POA: Insufficient documentation

## 2016-01-16 ENCOUNTER — Encounter (HOSPITAL_COMMUNITY): Payer: Self-pay

## 2016-01-17 ENCOUNTER — Telehealth: Payer: Self-pay | Admitting: Internal Medicine

## 2016-01-18 ENCOUNTER — Encounter (HOSPITAL_COMMUNITY): Payer: Self-pay

## 2016-01-18 NOTE — Telephone Encounter (Signed)
#   vials:1 Ordered date:01/17/16 Shipping Date:01/18/16

## 2016-01-22 NOTE — Telephone Encounter (Signed)
#   Vials:1 Arrival Date:01/23/16 Lot [id number] Exp Date:9/20

## 2016-01-23 ENCOUNTER — Encounter (HOSPITAL_COMMUNITY)
Admission: RE | Admit: 2016-01-23 | Discharge: 2016-01-23 | Disposition: A | Discharge status: Home / Self Care | Payer: Self-pay | Source: Ambulatory Visit | Attending: Pulmonary Disease | Admitting: Pulmonary Disease

## 2016-01-24 ENCOUNTER — Other Ambulatory Visit: Payer: Self-pay | Admitting: Internal Medicine

## 2016-01-25 ENCOUNTER — Encounter (HOSPITAL_COMMUNITY)
Admission: RE | Admit: 2016-01-25 | Discharge: 2016-01-25 | Disposition: A | Discharge status: Home / Self Care | Payer: Self-pay | Source: Ambulatory Visit | Attending: Pulmonary Disease | Admitting: Pulmonary Disease

## 2016-01-26 ENCOUNTER — Telehealth: Payer: Self-pay | Admitting: Internal Medicine

## 2016-01-26 ENCOUNTER — Ambulatory Visit (INDEPENDENT_AMBULATORY_CARE_PROVIDER_SITE_OTHER): Payer: Medicare Other

## 2016-01-26 DIAGNOSIS — J4540 Moderate persistent asthma, uncomplicated: Secondary | ICD-10-CM

## 2016-01-26 NOTE — Telephone Encounter (Signed)
Verifed that pt was on symbicort 160. Took samples to the pt in the allergy lab. Nothing further needed.

## 2016-01-29 MED ORDER — OMALIZUMAB 150 MG ~~LOC~~ SOLR
150.0000 mg | Freq: Once | SUBCUTANEOUS | Status: AC
  Administered 2016-01-29: 150 mg via SUBCUTANEOUS

## 2016-01-30 ENCOUNTER — Encounter (HOSPITAL_COMMUNITY)
Admission: RE | Admit: 2016-01-30 | Discharge: 2016-01-30 | Disposition: A | Discharge status: Home / Self Care | Payer: Self-pay | Source: Ambulatory Visit | Attending: Pulmonary Disease | Admitting: Pulmonary Disease

## 2016-02-01 ENCOUNTER — Encounter (HOSPITAL_COMMUNITY): Payer: Self-pay

## 2016-02-06 ENCOUNTER — Encounter (HOSPITAL_COMMUNITY): Payer: Self-pay

## 2016-02-08 ENCOUNTER — Encounter (HOSPITAL_COMMUNITY): Payer: Self-pay

## 2016-02-13 ENCOUNTER — Encounter (HOSPITAL_COMMUNITY)
Admission: RE | Admit: 2016-02-13 | Discharge: 2016-02-13 | Disposition: A | Discharge status: Home / Self Care | Payer: Self-pay | Source: Ambulatory Visit | Attending: Pulmonary Disease | Admitting: Pulmonary Disease

## 2016-02-13 DIAGNOSIS — Z5189 Encounter for other specified aftercare: Secondary | ICD-10-CM | POA: Insufficient documentation

## 2016-02-13 DIAGNOSIS — M810 Age-related osteoporosis without current pathological fracture: Secondary | ICD-10-CM | POA: Insufficient documentation

## 2016-02-13 DIAGNOSIS — J449 Chronic obstructive pulmonary disease, unspecified: Secondary | ICD-10-CM | POA: Insufficient documentation

## 2016-02-13 DIAGNOSIS — G4730 Sleep apnea, unspecified: Secondary | ICD-10-CM | POA: Insufficient documentation

## 2016-02-13 DIAGNOSIS — J45909 Unspecified asthma, uncomplicated: Secondary | ICD-10-CM | POA: Insufficient documentation

## 2016-02-13 DIAGNOSIS — K219 Gastro-esophageal reflux disease without esophagitis: Secondary | ICD-10-CM | POA: Insufficient documentation

## 2016-02-14 ENCOUNTER — Telehealth: Payer: Self-pay | Admitting: Internal Medicine

## 2016-02-15 ENCOUNTER — Encounter (HOSPITAL_COMMUNITY)
Admission: RE | Admit: 2016-02-15 | Discharge: 2016-02-15 | Disposition: A | Discharge status: Home / Self Care | Payer: Self-pay | Source: Ambulatory Visit | Attending: Pulmonary Disease | Admitting: Pulmonary Disease

## 2016-02-15 NOTE — Telephone Encounter (Signed)
#   vials:1 Ordered date:02/14/16 Shipping Date:02/15/16

## 2016-02-15 NOTE — Telephone Encounter (Signed)
#   Vials:1 Arrival Date:02/15/16 Lot [id number] Exp Date:9/20

## 2016-02-19 DIAGNOSIS — Y9342 Activity, yoga: Secondary | ICD-10-CM

## 2016-02-19 NOTE — Congregational Nurse Program (Signed)
Congregational Nurse Program Note  Date of Encounter: 02/19/2016  Past Medical History: Past Medical History  Diagnosis Date  . Sleep apnea   . ALLERGIC RHINITIS   . Colon polyp, hyperplastic 02/2003, 03/2014  . Chronic rhinosinusitis   . Hyperlipidemia   . Osteoporosis   . GERD (gastroesophageal reflux disease)   . Hiatal hernia   . Helicobacter pylori gastritis 02/2009    partially treated  . Gastroparesis 2010  . Chronic obstructive asthma     PFT 11/06/10 - FEV1 1.24/ 0.62; FEV1/FVC 0.56, TLC 0.78; DLCO 0.75  . Allergic asthma     h/o  . Bronchiectasis     h/o  . Hypertension   . COPD (chronic obstructive pulmonary disease) ([place])   . Anxiety     Encounter Details:     CNP Questionnaire - 02/19/16 0935    Patient Demographics   Is this a new or existing patient? Existing   Patient is considered a/an Not Applicable   Race African-American/Black   Patient Assistance   Location of Patient Assistance Not Applicable   Patient's financial/insurance status Medicare   Uninsured Patient No   Patient referred to apply for the following financial assistance Not Applicable   Food insecurities addressed Not Applicable   Transportation assistance Yes   Type of Assistance Volunteer   Assistance securing medications No   Educational health offerings Exercise/physical activity   Encounter Details   Primary purpose of visit Spiritual Care/Support Visit   Was an Emergency Department visit averted? Not Applicable   Does patient have a medical provider? Yes   Patient referred to Not Applicable   Was a mental health screening completed? (GAINS tool) No   Does patient have dental issues? No   Does patient have vision issues? No   Since previous encounter, have you referred patient for abnormal blood pressure that resulted in a new diagnosis or medication change? No   Since previous encounter, have you referred patient for abnormal blood glucose that resulted in a new diagnosis or  medication change? No   For Abstraction Use Only   Does patient have insurance? Yes       Attended chair yoga class.  No shortness of breath noted.

## 2016-02-20 ENCOUNTER — Encounter (HOSPITAL_COMMUNITY): Payer: Self-pay

## 2016-02-22 ENCOUNTER — Encounter (HOSPITAL_COMMUNITY)
Admission: RE | Admit: 2016-02-22 | Discharge: 2016-02-22 | Disposition: A | Discharge status: Home / Self Care | Payer: Self-pay | Source: Ambulatory Visit | Attending: Pulmonary Disease | Admitting: Pulmonary Disease

## 2016-02-23 ENCOUNTER — Ambulatory Visit: Payer: PRIVATE HEALTH INSURANCE

## 2016-02-26 ENCOUNTER — Telehealth: Payer: Self-pay | Admitting: Internal Medicine

## 2016-02-26 ENCOUNTER — Ambulatory Visit: Payer: PRIVATE HEALTH INSURANCE

## 2016-02-26 MED ORDER — PREDNISONE 10 MG PO TABS: ORAL_TABLET | ORAL | Status: DC

## 2016-02-26 MED ORDER — AZITHROMYCIN 250 MG PO TABS: ORAL_TABLET | ORAL | Status: DC

## 2016-02-26 NOTE — Telephone Encounter (Signed)
Likely ae-asthma  Plan Take prednisone 40 mg daily x 2 days, then 20mg  daily x 2 days, then 10mg  daily x 2 days, then 5mg  daily x 2 days and stop Z pak -      Allergies  Allergen Reactions  . 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 Latex Anaphylaxis  . Advair Diskus [Fluticasone-Salmeterol]     Tingling in mouth/ears ringing  . Diclofenac Sodium     REACTION: Hives  . Doxycycline Nausea And Vomiting  . Dulera [Mometasone Furo-Formoterol Fum]     HA  . Penicillins     Tongue swelling  . Sulfonamide Derivatives     Tongue swelling   . Tiotropium Bromide Monohydrate     Tongue/mouth itching Dysuria   . Albuterol Anxiety    Switched to xopenex

## 2016-02-26 NOTE — Telephone Encounter (Signed)
Called and spoke to pt. Informed her of the recs per MR. Rx sent to preferred pharmacy. Pt verbalized understanding and denied any further questions or concerns at this time.

## 2016-02-26 NOTE — Telephone Encounter (Signed)
Called and spoke to pt. Pt c/o increase in SOB, prod cough with clear mucus, wheezing, and yellow nasal drainage x 1 day. Pt denies CP/tightness and f/c/s.   MR please advise. Thanks.

## 2016-02-27 ENCOUNTER — Encounter (HOSPITAL_COMMUNITY): Payer: Self-pay

## 2016-02-28 ENCOUNTER — Telehealth: Payer: Self-pay | Admitting: Internal Medicine

## 2016-02-28 NOTE — Telephone Encounter (Signed)
Spoke with pt. States that she is feeling better since MR started her on prednisone and zpack. She is due for her Xolair injection. Pt is not running a fever or coughing as bad as she was.  MR - please advise if pt can come in for her Xolair injection.

## 2016-02-29 ENCOUNTER — Encounter (HOSPITAL_COMMUNITY): Payer: Self-pay

## 2016-02-29 NOTE — Telephone Encounter (Signed)
Spoke with pt and advised that per MR it is ok to get Xolair.  PT verbalized understanding.  Nothing further is needed.

## 2016-02-29 NOTE — Telephone Encounter (Signed)
[place] for [employer]

## 2016-03-01 ENCOUNTER — Ambulatory Visit (INDEPENDENT_AMBULATORY_CARE_PROVIDER_SITE_OTHER): Payer: Medicare Other

## 2016-03-01 ENCOUNTER — Telehealth: Payer: Self-pay | Admitting: Internal Medicine

## 2016-03-01 DIAGNOSIS — J4540 Moderate persistent asthma, uncomplicated: Secondary | ICD-10-CM

## 2016-03-01 MED ORDER — OMALIZUMAB 150 MG ~~LOC~~ SOLR
150.0000 mg | Freq: Once | SUBCUTANEOUS | Status: AC
  Administered 2016-03-01: 150 mg via SUBCUTANEOUS

## 2016-03-01 NOTE — Telephone Encounter (Signed)
Sample of symbicort given to pt.  Nothing further needed.

## 2016-03-05 ENCOUNTER — Telehealth: Payer: Self-pay | Admitting: Internal Medicine

## 2016-03-05 ENCOUNTER — Encounter (HOSPITAL_COMMUNITY): Payer: Self-pay

## 2016-03-06 NOTE — Telephone Encounter (Signed)
#   vials:1 Ordered date:03/05/16 Shipping Date:03/06/16

## 2016-03-06 NOTE — Telephone Encounter (Signed)
#   Vials:1 Arrival Date:03/06/16 Lot [id number] Exp Date:11/20  I didn't realize the xolair order sheet was in here until it was 5:27. That's why the order was put in today. Katie aware.

## 2016-03-07 ENCOUNTER — Encounter (HOSPITAL_COMMUNITY)
Admission: RE | Admit: 2016-03-07 | Discharge: 2016-03-07 | Disposition: A | Discharge status: Home / Self Care | Payer: Self-pay | Source: Ambulatory Visit | Attending: Pulmonary Disease | Admitting: Pulmonary Disease

## 2016-03-12 ENCOUNTER — Encounter (HOSPITAL_COMMUNITY)
Admission: RE | Admit: 2016-03-12 | Discharge: 2016-03-12 | Disposition: A | Discharge status: Home / Self Care | Payer: Self-pay | Source: Ambulatory Visit | Attending: Pulmonary Disease | Admitting: Pulmonary Disease

## 2016-03-12 DIAGNOSIS — Z5189 Encounter for other specified aftercare: Secondary | ICD-10-CM | POA: Insufficient documentation

## 2016-03-12 DIAGNOSIS — M810 Age-related osteoporosis without current pathological fracture: Secondary | ICD-10-CM | POA: Insufficient documentation

## 2016-03-12 DIAGNOSIS — K219 Gastro-esophageal reflux disease without esophagitis: Secondary | ICD-10-CM | POA: Insufficient documentation

## 2016-03-12 DIAGNOSIS — G4730 Sleep apnea, unspecified: Secondary | ICD-10-CM | POA: Insufficient documentation

## 2016-03-12 DIAGNOSIS — J449 Chronic obstructive pulmonary disease, unspecified: Secondary | ICD-10-CM | POA: Insufficient documentation

## 2016-03-12 DIAGNOSIS — J45909 Unspecified asthma, uncomplicated: Secondary | ICD-10-CM | POA: Insufficient documentation

## 2016-03-14 ENCOUNTER — Encounter (HOSPITAL_COMMUNITY)
Admission: RE | Admit: 2016-03-14 | Discharge: 2016-03-14 | Disposition: A | Discharge status: Home / Self Care | Payer: Self-pay | Source: Ambulatory Visit | Attending: Pulmonary Disease | Admitting: Pulmonary Disease

## 2016-03-18 DIAGNOSIS — Y9342 Activity, yoga: Secondary | ICD-10-CM

## 2016-03-18 NOTE — Congregational Nurse Program (Signed)
Congregational Nurse Program Note  Date of Encounter: 03/18/2016  Past Medical History: Past Medical History  Diagnosis Date  . Sleep apnea   . ALLERGIC RHINITIS   . Colon polyp, hyperplastic 02/2003, 03/2014  . Chronic rhinosinusitis   . Hyperlipidemia   . Osteoporosis   . GERD (gastroesophageal reflux disease)   . Hiatal hernia   . Helicobacter pylori gastritis 02/2009    partially treated  . Gastroparesis 2010  . Chronic obstructive asthma     PFT 11/06/10 - FEV1 1.24/ 0.62; FEV1/FVC 0.56, TLC 0.78; DLCO 0.75  . Allergic asthma     h/o  . Bronchiectasis     h/o  . Hypertension   . COPD (chronic obstructive pulmonary disease) ([place])   . Anxiety     Encounter Details:     CNP Questionnaire - 03/18/16 1111    Patient Demographics   Is this a new or existing patient? Existing   Patient is considered a/an Not Applicable   Race African-American/Black   Patient Assistance   Location of Patient Assistance Not Applicable   Patient's financial/insurance status Medicare   Uninsured Patient No   Patient referred to apply for the following financial assistance Not Applicable   Food insecurities addressed Not Applicable   Transportation assistance Yes   Type of Assistance Volunteer   Assistance securing medications No   Educational health offerings Exercise/physical activity   Encounter Details   Primary purpose of visit Spiritual Care/Support Visit   Was an Emergency Department visit averted? Not Applicable   Does patient have a medical provider? Yes   Patient referred to Not Applicable   Was a mental health screening completed? (GAINS tool) No   Does patient have dental issues? No   Does patient have vision issues? No   Does your patient have an abnormal blood pressure today? No   Since previous encounter, have you referred patient for abnormal blood pressure that resulted in a new diagnosis or medication change? No   Does your patient have an abnormal blood glucose today?  No   Since previous encounter, have you referred patient for abnormal blood glucose that resulted in a new diagnosis or medication change? No   Was there a life-saving intervention made? No       Attended chair yoga class.

## 2016-03-19 ENCOUNTER — Encounter (HOSPITAL_COMMUNITY): Payer: Self-pay

## 2016-03-21 ENCOUNTER — Encounter (HOSPITAL_COMMUNITY)
Admission: RE | Admit: 2016-03-21 | Discharge: 2016-03-21 | Disposition: A | Discharge status: Home / Self Care | Payer: Self-pay | Source: Ambulatory Visit | Attending: Pulmonary Disease | Admitting: Pulmonary Disease

## 2016-03-25 ENCOUNTER — Other Ambulatory Visit: Payer: Self-pay

## 2016-03-25 ENCOUNTER — Other Ambulatory Visit: Payer: Self-pay | Admitting: Internal Medicine

## 2016-03-25 DIAGNOSIS — Z1231 Encounter for screening mammogram for malignant neoplasm of breast: Secondary | ICD-10-CM

## 2016-03-26 ENCOUNTER — Other Ambulatory Visit: Payer: Self-pay

## 2016-03-26 ENCOUNTER — Encounter (HOSPITAL_COMMUNITY): Payer: Self-pay

## 2016-03-26 MED ORDER — EPINEPHRINE 0.3 MG/0.3ML IJ SOAJ: 0.3000 mg | Freq: Once | INTRAMUSCULAR | Status: DC

## 2016-03-26 NOTE — Telephone Encounter (Signed)
Sent to pharmacy 

## 2016-03-28 ENCOUNTER — Encounter (HOSPITAL_COMMUNITY)
Admission: RE | Admit: 2016-03-28 | Discharge: 2016-03-28 | Disposition: A | Discharge status: Home / Self Care | Payer: Self-pay | Source: Ambulatory Visit | Attending: Pulmonary Disease | Admitting: Pulmonary Disease

## 2016-03-29 ENCOUNTER — Ambulatory Visit: Payer: Medicare Other

## 2016-04-01 ENCOUNTER — Ambulatory Visit (INDEPENDENT_AMBULATORY_CARE_PROVIDER_SITE_OTHER): Payer: Medicare Other

## 2016-04-01 ENCOUNTER — Telehealth: Payer: Self-pay | Admitting: Internal Medicine

## 2016-04-01 DIAGNOSIS — J4540 Moderate persistent asthma, uncomplicated: Secondary | ICD-10-CM

## 2016-04-01 MED ORDER — OMALIZUMAB 150 MG ~~LOC~~ SOLR
150.0000 mg | Freq: Once | SUBCUTANEOUS | Status: AC
Start: 2016-04-01 — End: 2016-04-01
  Administered 2016-04-01: 150 mg via SUBCUTANEOUS

## 2016-04-01 NOTE — Telephone Encounter (Signed)
Gave two samples of Symbicort 160 to patient. Nothing further needed.

## 2016-04-02 ENCOUNTER — Encounter (HOSPITAL_COMMUNITY): Payer: Self-pay

## 2016-04-03 ENCOUNTER — Telehealth: Payer: Self-pay | Admitting: Internal Medicine

## 2016-04-04 ENCOUNTER — Encounter (HOSPITAL_COMMUNITY)
Admission: RE | Admit: 2016-04-04 | Discharge: 2016-04-04 | Disposition: A | Discharge status: Home / Self Care | Payer: Self-pay | Source: Ambulatory Visit | Attending: Pulmonary Disease | Admitting: Pulmonary Disease

## 2016-04-04 NOTE — Telephone Encounter (Signed)
#   Vials:1 Arrival Date:04/04/16 Lot [id number] Exp Date:12/20

## 2016-04-04 NOTE — Telephone Encounter (Signed)
#   vials:1 Ordered date:04/03/16 Shipping Date:04/04/16

## 2016-04-09 ENCOUNTER — Encounter (HOSPITAL_COMMUNITY): Payer: Self-pay

## 2016-04-11 ENCOUNTER — Encounter (HOSPITAL_COMMUNITY): Payer: Self-pay

## 2016-04-11 DIAGNOSIS — J45909 Unspecified asthma, uncomplicated: Secondary | ICD-10-CM | POA: Insufficient documentation

## 2016-04-11 DIAGNOSIS — K219 Gastro-esophageal reflux disease without esophagitis: Secondary | ICD-10-CM | POA: Insufficient documentation

## 2016-04-11 DIAGNOSIS — M810 Age-related osteoporosis without current pathological fracture: Secondary | ICD-10-CM | POA: Insufficient documentation

## 2016-04-11 DIAGNOSIS — G4730 Sleep apnea, unspecified: Secondary | ICD-10-CM | POA: Insufficient documentation

## 2016-04-11 DIAGNOSIS — J449 Chronic obstructive pulmonary disease, unspecified: Secondary | ICD-10-CM | POA: Insufficient documentation

## 2016-04-11 DIAGNOSIS — Z5189 Encounter for other specified aftercare: Secondary | ICD-10-CM | POA: Insufficient documentation

## 2016-04-15 ENCOUNTER — Ambulatory Visit
Admission: RE | Admit: 2016-04-15 | Discharge: 2016-04-15 | Disposition: A | Discharge status: Home / Self Care | Payer: Medicare Other | Source: Ambulatory Visit

## 2016-04-15 DIAGNOSIS — Z1231 Encounter for screening mammogram for malignant neoplasm of breast: Secondary | ICD-10-CM | POA: Diagnosis not present

## 2016-04-16 ENCOUNTER — Encounter (HOSPITAL_COMMUNITY)
Admission: RE | Admit: 2016-04-16 | Discharge: 2016-04-16 | Disposition: A | Discharge status: Home / Self Care | Payer: Self-pay | Source: Ambulatory Visit | Attending: Pulmonary Disease | Admitting: Pulmonary Disease

## 2016-04-17 ENCOUNTER — Other Ambulatory Visit: Payer: Self-pay | Admitting: Internal Medicine

## 2016-04-17 DIAGNOSIS — R928 Other abnormal and inconclusive findings on diagnostic imaging of breast: Secondary | ICD-10-CM

## 2016-04-18 ENCOUNTER — Encounter (HOSPITAL_COMMUNITY)
Admission: RE | Admit: 2016-04-18 | Discharge: 2016-04-18 | Disposition: A | Discharge status: Home / Self Care | Payer: Self-pay | Source: Ambulatory Visit | Attending: Pulmonary Disease | Admitting: Pulmonary Disease

## 2016-04-23 ENCOUNTER — Encounter (HOSPITAL_COMMUNITY): Payer: Self-pay

## 2016-04-23 ENCOUNTER — Ambulatory Visit
Admission: RE | Admit: 2016-04-23 | Discharge: 2016-04-23 | Disposition: A | Discharge status: Home / Self Care | Payer: Medicare Other | Source: Ambulatory Visit | Attending: Internal Medicine | Admitting: Internal Medicine

## 2016-04-23 DIAGNOSIS — Y9342 Activity, yoga: Secondary | ICD-10-CM

## 2016-04-23 DIAGNOSIS — R928 Other abnormal and inconclusive findings on diagnostic imaging of breast: Secondary | ICD-10-CM

## 2016-04-23 DIAGNOSIS — R921 Mammographic calcification found on diagnostic imaging of breast: Secondary | ICD-10-CM | POA: Diagnosis not present

## 2016-04-23 NOTE — Congregational Nurse Program (Signed)
Congregational Nurse Program Note  Date of Encounter: 04/23/2016  Past Medical History: Past Medical History  Diagnosis Date  . Sleep apnea   . ALLERGIC RHINITIS   . Colon polyp, hyperplastic 02/2003, 03/2014  . Chronic rhinosinusitis   . Hyperlipidemia   . Osteoporosis   . GERD (gastroesophageal reflux disease)   . Hiatal hernia   . Helicobacter pylori gastritis 02/2009    partially treated  . Gastroparesis 2010  . Chronic obstructive asthma     PFT 11/06/10 - FEV1 1.24/ 0.62; FEV1/FVC 0.56, TLC 0.78; DLCO 0.75  . Allergic asthma     h/o  . Bronchiectasis     h/o  . Hypertension   . COPD (chronic obstructive pulmonary disease) ([place])   . Anxiety     Encounter Details:     CNP Questionnaire - 04/23/16 1413    Patient Assistance   Type of Assistance Volunteer       Attended chair yoga class.

## 2016-04-25 ENCOUNTER — Encounter (HOSPITAL_COMMUNITY): Payer: Self-pay

## 2016-04-29 ENCOUNTER — Ambulatory Visit (INDEPENDENT_AMBULATORY_CARE_PROVIDER_SITE_OTHER): Payer: Medicare Other

## 2016-04-29 ENCOUNTER — Telehealth: Payer: Self-pay | Admitting: Internal Medicine

## 2016-04-29 ENCOUNTER — Other Ambulatory Visit: Payer: Self-pay | Admitting: Internal Medicine

## 2016-04-29 DIAGNOSIS — J4540 Moderate persistent asthma, uncomplicated: Secondary | ICD-10-CM | POA: Diagnosis not present

## 2016-04-29 MED ORDER — OMALIZUMAB 150 MG ~~LOC~~ SOLR
150.0000 mg | Freq: Once | SUBCUTANEOUS | Status: AC
Start: 2016-04-29 — End: 2016-04-29
  Administered 2016-04-29: 150 mg via SUBCUTANEOUS

## 2016-04-29 NOTE — Telephone Encounter (Signed)
Samples have been given to the pt. Nothing further was needed. 

## 2016-04-29 NOTE — Telephone Encounter (Signed)
Pt is hear waiting.[name] [name]

## 2016-04-30 ENCOUNTER — Encounter (HOSPITAL_COMMUNITY)
Admission: RE | Admit: 2016-04-30 | Discharge: 2016-04-30 | Disposition: A | Discharge status: Home / Self Care | Payer: Self-pay | Source: Ambulatory Visit | Attending: Pulmonary Disease | Admitting: Pulmonary Disease

## 2016-05-01 ENCOUNTER — Telehealth: Payer: Self-pay | Admitting: Internal Medicine

## 2016-05-01 NOTE — Telephone Encounter (Signed)
#   vials:1 Ordered date:05/01/16 Shipping Date:05/02/16

## 2016-05-02 ENCOUNTER — Encounter (HOSPITAL_COMMUNITY)
Admission: RE | Admit: 2016-05-02 | Discharge: 2016-05-02 | Disposition: A | Discharge status: Home / Self Care | Payer: Self-pay | Source: Ambulatory Visit | Attending: Pulmonary Disease | Admitting: Pulmonary Disease

## 2016-05-02 NOTE — Telephone Encounter (Signed)
#   Vials:1 Arrival Date:05-02-16 Lot [id number] Exp Date:10/2019

## 2016-05-07 ENCOUNTER — Encounter (HOSPITAL_COMMUNITY): Payer: Self-pay

## 2016-05-08 DIAGNOSIS — I10 Essential (primary) hypertension: Secondary | ICD-10-CM | POA: Diagnosis not present

## 2016-05-08 DIAGNOSIS — N393 Stress incontinence (female) (male): Secondary | ICD-10-CM | POA: Diagnosis not present

## 2016-05-08 DIAGNOSIS — D721 Eosinophilia: Secondary | ICD-10-CM | POA: Diagnosis not present

## 2016-05-08 DIAGNOSIS — M8580 Other specified disorders of bone density and structure, unspecified site: Secondary | ICD-10-CM | POA: Diagnosis not present

## 2016-05-08 DIAGNOSIS — M545 Low back pain: Secondary | ICD-10-CM | POA: Diagnosis not present

## 2016-05-08 DIAGNOSIS — E559 Vitamin D deficiency, unspecified: Secondary | ICD-10-CM | POA: Diagnosis not present

## 2016-05-09 ENCOUNTER — Encounter (HOSPITAL_COMMUNITY)
Admission: RE | Admit: 2016-05-09 | Discharge: 2016-05-09 | Disposition: A | Discharge status: Home / Self Care | Payer: Self-pay | Source: Ambulatory Visit | Attending: Pulmonary Disease | Admitting: Pulmonary Disease

## 2016-05-14 ENCOUNTER — Encounter (HOSPITAL_COMMUNITY): Payer: Self-pay

## 2016-05-14 DIAGNOSIS — J449 Chronic obstructive pulmonary disease, unspecified: Secondary | ICD-10-CM | POA: Insufficient documentation

## 2016-05-14 DIAGNOSIS — J45909 Unspecified asthma, uncomplicated: Secondary | ICD-10-CM | POA: Insufficient documentation

## 2016-05-14 DIAGNOSIS — Z5189 Encounter for other specified aftercare: Secondary | ICD-10-CM | POA: Insufficient documentation

## 2016-05-14 DIAGNOSIS — M810 Age-related osteoporosis without current pathological fracture: Secondary | ICD-10-CM | POA: Insufficient documentation

## 2016-05-14 DIAGNOSIS — K219 Gastro-esophageal reflux disease without esophagitis: Secondary | ICD-10-CM | POA: Insufficient documentation

## 2016-05-14 DIAGNOSIS — G4730 Sleep apnea, unspecified: Secondary | ICD-10-CM | POA: Insufficient documentation

## 2016-05-16 ENCOUNTER — Encounter (HOSPITAL_COMMUNITY): Payer: Self-pay

## 2016-05-17 ENCOUNTER — Ambulatory Visit (INDEPENDENT_AMBULATORY_CARE_PROVIDER_SITE_OTHER): Payer: Medicare Other | Admitting: Internal Medicine

## 2016-05-17 ENCOUNTER — Encounter: Payer: Self-pay | Admitting: Internal Medicine

## 2016-05-17 VITALS — BP 142/86 | HR 67 | Ht 64.0 in | Wt 163.0 lb

## 2016-05-17 DIAGNOSIS — R0602 Shortness of breath: Secondary | ICD-10-CM | POA: Diagnosis not present

## 2016-05-17 DIAGNOSIS — L989 Disorder of the skin and subcutaneous tissue, unspecified: Secondary | ICD-10-CM

## 2016-05-17 DIAGNOSIS — J4540 Moderate persistent asthma, uncomplicated: Secondary | ICD-10-CM | POA: Diagnosis not present

## 2016-05-17 LAB — NITRIC OXIDE: Nitric Oxide: 20

## 2016-05-17 NOTE — Patient Instructions (Addendum)
Moderate persistent asthma, uncomplicated - despite lot of symptoms nitric oxide test suggests good control  Plan  - continue singulair, xolair and symbicoprt - Hold off treatment for an exacerbation but do call us if things are getting worse. - Follow-up in 3 months at which time we will check spirometry and correlated with the nitric oxide test  Skin lesion of the shin bilaterally  -  I do not know what this is. However I sincerely doubt this is due to [place]. Nevertheless we will get dermatology opinion. If they think this with blood test. erythema nodosum then we will check for sarcoidosis    Followup  = 3 months or sooner if needed   - spirometry at pft lab (pre-and postbronchodilator but normal lung volumes or DLCO] in 3 months  - Nitric oxide testing at follow-up and asthma control questionnaire at follow-up in 3 months
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
## 2016-05-17 NOTE — Addendum Note (Signed)
Addended by: Virl Cagey on: 05/17/2016 04:44 PM   Modules accepted: Orders

## 2016-05-21 ENCOUNTER — Other Ambulatory Visit: Payer: Self-pay | Admitting: Family

## 2016-05-21 ENCOUNTER — Encounter (HOSPITAL_COMMUNITY)
Admission: RE | Admit: 2016-05-21 | Discharge: 2016-05-21 | Disposition: A | Discharge status: Home / Self Care | Payer: Self-pay | Source: Ambulatory Visit | Attending: Pulmonary Disease | Admitting: Pulmonary Disease

## 2016-05-23 ENCOUNTER — Encounter (HOSPITAL_COMMUNITY)
Admission: RE | Admit: 2016-05-23 | Discharge: 2016-05-23 | Disposition: A | Discharge status: Home / Self Care | Payer: Self-pay | Source: Ambulatory Visit | Attending: Pulmonary Disease | Admitting: Pulmonary Disease

## 2016-05-28 ENCOUNTER — Encounter (HOSPITAL_COMMUNITY): Payer: Self-pay

## 2016-05-30 ENCOUNTER — Encounter (HOSPITAL_COMMUNITY): Payer: Self-pay

## 2016-06-01 ENCOUNTER — Other Ambulatory Visit: Payer: Self-pay | Admitting: Internal Medicine

## 2016-06-03 ENCOUNTER — Ambulatory Visit (INDEPENDENT_AMBULATORY_CARE_PROVIDER_SITE_OTHER): Payer: Medicare Other

## 2016-06-03 ENCOUNTER — Telehealth: Payer: Self-pay | Admitting: Internal Medicine

## 2016-06-03 DIAGNOSIS — J4540 Moderate persistent asthma, uncomplicated: Secondary | ICD-10-CM | POA: Diagnosis not present

## 2016-06-03 NOTE — Telephone Encounter (Signed)
Patient has already got xolair injection, she states she will be back to get samples if they are available due to her transportation waiting on her outside.

## 2016-06-03 NOTE — Telephone Encounter (Signed)
Checked for samples, none in the office  Tried to catch pt before she left the office but was unable to [facility] T[facility] x1

## 2016-06-04 ENCOUNTER — Encounter (HOSPITAL_COMMUNITY): Payer: Self-pay

## 2016-06-05 MED ORDER — OMALIZUMAB 150 MG ~~LOC~~ SOLR
150.0000 mg | Freq: Once | SUBCUTANEOUS | Status: AC
  Administered 2016-06-03: 150 mg via SUBCUTANEOUS

## 2016-06-06 ENCOUNTER — Encounter (HOSPITAL_COMMUNITY)
Admission: RE | Admit: 2016-06-06 | Discharge: 2016-06-06 | Disposition: A | Discharge status: Home / Self Care | Payer: Self-pay | Source: Ambulatory Visit | Attending: Pulmonary Disease | Admitting: Pulmonary Disease

## 2016-06-07 DIAGNOSIS — I8312 Varicose veins of left lower extremity with inflammation: Secondary | ICD-10-CM | POA: Diagnosis not present

## 2016-06-07 DIAGNOSIS — I8311 Varicose veins of right lower extremity with inflammation: Secondary | ICD-10-CM | POA: Diagnosis not present

## 2016-06-07 DIAGNOSIS — D692 Other nonthrombocytopenic purpura: Secondary | ICD-10-CM | POA: Diagnosis not present

## 2016-06-07 DIAGNOSIS — I872 Venous insufficiency (chronic) (peripheral): Secondary | ICD-10-CM | POA: Diagnosis not present

## 2016-06-11 ENCOUNTER — Encounter (HOSPITAL_COMMUNITY): Payer: Self-pay

## 2016-06-11 DIAGNOSIS — G4730 Sleep apnea, unspecified: Secondary | ICD-10-CM | POA: Insufficient documentation

## 2016-06-11 DIAGNOSIS — J449 Chronic obstructive pulmonary disease, unspecified: Secondary | ICD-10-CM | POA: Insufficient documentation

## 2016-06-11 DIAGNOSIS — K219 Gastro-esophageal reflux disease without esophagitis: Secondary | ICD-10-CM | POA: Insufficient documentation

## 2016-06-11 DIAGNOSIS — Z5189 Encounter for other specified aftercare: Secondary | ICD-10-CM | POA: Insufficient documentation

## 2016-06-11 DIAGNOSIS — J45909 Unspecified asthma, uncomplicated: Secondary | ICD-10-CM | POA: Insufficient documentation

## 2016-06-11 DIAGNOSIS — M810 Age-related osteoporosis without current pathological fracture: Secondary | ICD-10-CM | POA: Insufficient documentation

## 2016-06-12 ENCOUNTER — Institutional Professional Consult (permissible substitution): Payer: Medicare Other | Admitting: Pulmonary Disease

## 2016-06-12 ENCOUNTER — Telehealth: Payer: Self-pay | Admitting: Internal Medicine

## 2016-06-12 NOTE — Telephone Encounter (Signed)
#   vials:1 Ordered date:06/12/16 Shipping Date:06/12/16

## 2016-06-13 ENCOUNTER — Encounter (HOSPITAL_COMMUNITY): Admission: RE | Admit: 2016-06-13 | Payer: Self-pay | Source: Ambulatory Visit

## 2016-06-13 NOTE — Telephone Encounter (Signed)
#   Vials:1 Arrival Date: 06/13/16 Lot [id number] Exp Date:3/21

## 2016-06-18 ENCOUNTER — Encounter (HOSPITAL_COMMUNITY): Admission: RE | Admit: 2016-06-18 | Payer: Self-pay | Source: Ambulatory Visit

## 2016-06-18 ENCOUNTER — Other Ambulatory Visit: Payer: Self-pay | Admitting: Internal Medicine

## 2016-06-19 NOTE — Telephone Encounter (Signed)
Faxed to pharmacy,

## 2016-06-20 ENCOUNTER — Encounter (HOSPITAL_COMMUNITY): Payer: Self-pay

## 2016-06-25 ENCOUNTER — Encounter (HOSPITAL_COMMUNITY)
Admission: RE | Admit: 2016-06-25 | Discharge: 2016-06-25 | Disposition: A | Discharge status: Home / Self Care | Payer: Self-pay | Source: Ambulatory Visit | Attending: Pulmonary Disease | Admitting: Pulmonary Disease

## 2016-06-26 ENCOUNTER — Other Ambulatory Visit: Payer: Self-pay | Admitting: Family

## 2016-06-27 ENCOUNTER — Encounter (HOSPITAL_COMMUNITY): Payer: Self-pay

## 2016-06-27 DIAGNOSIS — Y9342 Activity, yoga: Secondary | ICD-10-CM

## 2016-06-27 NOTE — Congregational Nurse Program (Signed)
Congregational Nurse Program Note  Date of Encounter: 06/27/2016  Past Medical History: Past Medical History:  Diagnosis Date  . Allergic asthma    h/o  . ALLERGIC RHINITIS   . Anxiety   . Bronchiectasis    h/o  . Chronic obstructive asthma    PFT 11/06/10 - FEV1 1.24/ 0.62; FEV1/FVC 0.56, TLC 0.78; DLCO 0.75  . Chronic rhinosinusitis   . Colon polyp, hyperplastic 02/2003, 03/2014  . COPD (chronic obstructive pulmonary disease) ([place])   . Gastroparesis 2010  . GERD (gastroesophageal reflux disease)   . Helicobacter pylori gastritis 02/2009   partially treated  . Hiatal hernia   . Hyperlipidemia   . Hypertension   . Osteoporosis   . Sleep apnea     Encounter Details:     CNP Questionnaire - 06/19/16 1052      Patient Demographics   Is this a new or existing patient? Existing   Patient is considered a/an Not Applicable   Race African-American/Black     Patient Assistance   Location of Patient Assistance Not Applicable   Patient's financial/insurance status Medicare   Uninsured Patient No   Patient referred to apply for the following financial assistance Not Applicable   Food insecurities addressed Not Applicable   Transportation assistance Yes   Assistance securing medications No   Educational health offerings Exercise/physical activity     Encounter Details   Primary purpose of visit Spiritual Care/Support Visit   Was an Emergency Department visit averted? Not Applicable   Does patient have a medical provider? Yes   Patient referred to Not Applicable   Was a mental health screening completed? (GAINS tool) No   Does patient have dental issues? No   Does patient have vision issues? No   Does your patient have an abnormal blood pressure today? No   Since previous encounter, have you referred patient for abnormal blood pressure that resulted in a new diagnosis or medication change? No   Does your patient have an abnormal blood glucose today? No   Since previous  encounter, have you referred patient for abnormal blood glucose that resulted in a new diagnosis or medication change? No   Was there a life-saving intervention made? No      Attended chair yoga class.

## 2016-07-01 ENCOUNTER — Ambulatory Visit (INDEPENDENT_AMBULATORY_CARE_PROVIDER_SITE_OTHER): Payer: Medicare Other

## 2016-07-01 ENCOUNTER — Telehealth: Payer: Self-pay | Admitting: Internal Medicine

## 2016-07-01 DIAGNOSIS — J4540 Moderate persistent asthma, uncomplicated: Secondary | ICD-10-CM

## 2016-07-01 NOTE — Telephone Encounter (Signed)
Spoke with pt, advised that we do not have samples of symbicort 160 at this time.  Pt expressed understanding.  Nothing further needed.

## 2016-07-02 ENCOUNTER — Encounter (HOSPITAL_COMMUNITY): Payer: Self-pay

## 2016-07-02 MED ORDER — OMALIZUMAB 150 MG ~~LOC~~ SOLR
150.0000 mg | SUBCUTANEOUS | Status: DC
  Administered 2016-07-01: 150 mg via SUBCUTANEOUS

## 2016-07-04 ENCOUNTER — Encounter (HOSPITAL_COMMUNITY): Admission: RE | Admit: 2016-07-04 | Payer: Self-pay | Source: Ambulatory Visit

## 2016-07-09 ENCOUNTER — Encounter (HOSPITAL_COMMUNITY)
Admission: RE | Admit: 2016-07-09 | Discharge: 2016-07-09 | Disposition: A | Discharge status: Home / Self Care | Payer: Self-pay | Source: Ambulatory Visit | Attending: Pulmonary Disease | Admitting: Pulmonary Disease

## 2016-07-10 ENCOUNTER — Telehealth: Payer: Self-pay | Admitting: Internal Medicine

## 2016-07-10 NOTE — Telephone Encounter (Signed)
#   vials:1 Ordered date:07/10/16 Shipping Date:07/11/16

## 2016-07-11 ENCOUNTER — Encounter (HOSPITAL_COMMUNITY)
Admission: RE | Admit: 2016-07-11 | Discharge: 2016-07-11 | Disposition: A | Discharge status: Home / Self Care | Payer: Self-pay | Source: Ambulatory Visit | Attending: Pulmonary Disease | Admitting: Pulmonary Disease

## 2016-07-11 NOTE — Telephone Encounter (Signed)
#   Vials:1 Arrival Date:07/11/16 Lot [id number] Exp Date:3/21

## 2016-07-16 ENCOUNTER — Telehealth: Payer: Self-pay | Admitting: Internal Medicine

## 2016-07-16 ENCOUNTER — Encounter (HOSPITAL_COMMUNITY)
Admission: RE | Admit: 2016-07-16 | Discharge: 2016-07-16 | Disposition: A | Discharge status: Home / Self Care | Payer: Self-pay | Source: Ambulatory Visit | Attending: Pulmonary Disease | Admitting: Pulmonary Disease

## 2016-07-16 DIAGNOSIS — J45909 Unspecified asthma, uncomplicated: Secondary | ICD-10-CM | POA: Insufficient documentation

## 2016-07-16 DIAGNOSIS — G4730 Sleep apnea, unspecified: Secondary | ICD-10-CM | POA: Insufficient documentation

## 2016-07-16 DIAGNOSIS — M810 Age-related osteoporosis without current pathological fracture: Secondary | ICD-10-CM | POA: Insufficient documentation

## 2016-07-16 DIAGNOSIS — J449 Chronic obstructive pulmonary disease, unspecified: Secondary | ICD-10-CM | POA: Insufficient documentation

## 2016-07-16 DIAGNOSIS — K219 Gastro-esophageal reflux disease without esophagitis: Secondary | ICD-10-CM | POA: Insufficient documentation

## 2016-07-16 DIAGNOSIS — Z5189 Encounter for other specified aftercare: Secondary | ICD-10-CM | POA: Insufficient documentation

## 2016-07-16 MED ORDER — BUDESONIDE-FORMOTEROL FUMARATE 160-4.5 MCG/ACT IN AERO: 2.0000 | INHALATION_SPRAY | Freq: Two times a day (BID) | RESPIRATORY_TRACT | 0 refills | Status: DC

## 2016-07-16 NOTE — Telephone Encounter (Signed)
Called and spoke with pt and she is aware of sample of the symbicort that has been left up front for her to pick up.  Nothing further is needed.

## 2016-07-18 ENCOUNTER — Telehealth: Payer: Self-pay

## 2016-07-18 ENCOUNTER — Ambulatory Visit (INDEPENDENT_AMBULATORY_CARE_PROVIDER_SITE_OTHER): Payer: Medicare Other

## 2016-07-18 ENCOUNTER — Encounter (HOSPITAL_COMMUNITY): Payer: Self-pay

## 2016-07-18 ENCOUNTER — Telehealth: Payer: Self-pay | Admitting: Emergency Medicine

## 2016-07-18 VITALS — BP 148/70 | HR 51 | Ht 63.0 in | Wt 169.0 lb

## 2016-07-18 DIAGNOSIS — Z0000 Encounter for general adult medical examination without abnormal findings: Secondary | ICD-10-CM

## 2016-07-18 NOTE — Telephone Encounter (Signed)
Pt called and stated she was talking to you today at her appointment and needed to call you back about a cream from the dermatologist. The name of the cream is Fluocinonide. Thanks.
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
## 2016-07-18 NOTE — Telephone Encounter (Signed)
Patient in for AWV. Did not understand referral to pain management but agreed to go after discussing.  Referral as been closed; Can you re-refer as discussed as the patient is willing to go.  Tks

## 2016-07-18 NOTE — Telephone Encounter (Signed)
Looking through her chart she may need visit since I have not seen her since December 2016.

## 2016-07-18 NOTE — Patient Instructions (Addendum)
[name] [name] , Thank you for taking time to come for your Medicare Wellness Visit. I appreciate your ongoing commitment to your health goals. Please review the following plan we discussed and let me know if I can assist you in the future.   Will try to refer again to pain management and have them give her a call   These are the goals we discussed: Goals    . patient          Wants to be comfortable and get around; Will take cell phone to the bathroom or phone with you in case you fall        This is a list of the screening recommended for you and due dates:  Health Maintenance  Topic Date Due  . Flu Shot  10/03/2016*  . Shingles Vaccine  10/03/2016*  . Tetanus Vaccine  10/03/2016*  . Mammogram  04/23/2018  . Colon Cancer Screening  03/29/2024  . DEXA scan (bone density measurement)  Completed  . Pneumonia vaccines  Completed  *Topic was postponed. The date shown is not the original due date.       Fall Prevention in the Home  Falls can cause injuries. They can happen to people of all ages. There are many things you can do to make your home safe and to help prevent falls.  WHAT CAN I DO ON THE OUTSIDE OF MY HOME?  Regularly fix the edges of walkways and driveways and fix any cracks.  Remove anything that might make you trip as you walk through a door, such as a raised step or threshold.  Trim any bushes or trees on the path to your home.  Use bright outdoor lighting.  Clear any walking paths of anything that might make someone trip, such as rocks or tools.  Regularly check to see if handrails are loose or broken. Make sure that both sides of any steps have handrails.  Any raised decks and porches should have guardrails on the edges.  Have any leaves, snow, or ice cleared regularly.  Use sand or salt on walking paths during winter.  Clean up any spills in your garage right away. This includes oil or grease spills. WHAT CAN I DO IN THE BATHROOM?   Use night  lights.  Install grab bars by the toilet and in the tub and shower. Do not use towel bars as grab bars.  Use non-skid mats or decals in the tub or shower.  If you need to sit down in the shower, use a plastic, non-slip stool.  Keep the floor dry. Clean up any water that spills on the floor as soon as it happens.  Remove soap buildup in the tub or shower regularly.  Attach bath mats securely with double-sided non-slip rug tape.  Do not have throw rugs and other things on the floor that can make you trip. WHAT CAN I DO IN THE BEDROOM?  Use night lights.  Make sure that you have a light by your bed that is easy to reach.  Do not use any sheets or blankets that are too big for your bed. They should not hang down onto the floor.  Have a firm chair that has side arms. You can use this for support while you get dressed.  Do not have throw rugs and other things on the floor that can make you trip. WHAT CAN I DO IN THE KITCHEN?  Clean up any spills right away.  Avoid walking on wet floors.  Keep items that you use a lot in easy-to-reach places.  If you need to reach something above you, use a strong step stool that has a grab bar.  Keep electrical cords out of the way.  Do not use floor polish or wax that makes floors slippery. If you must use wax, use non-skid floor wax.  Do not have throw rugs and other things on the floor that can make you trip. WHAT CAN I DO WITH MY STAIRS?  Do not leave any items on the stairs.  Make sure that there are handrails on both sides of the stairs and use them. Fix handrails that are broken or loose. Make sure that handrails are as long as the stairways.  Check any carpeting to make sure that it is firmly attached to the stairs. Fix any carpet that is loose or worn.  Avoid having throw rugs at the top or bottom of the stairs. If you do have throw rugs, attach them to the floor with carpet tape.  Make sure that you have a light switch at the  top of the stairs and the bottom of the stairs. If you do not have them, ask someone to add them for you. WHAT ELSE CAN I DO TO HELP PREVENT FALLS?  Wear shoes that:  Do not have high heels.  Have rubber bottoms.  Are comfortable and fit you well.  Are closed at the toe. Do not wear sandals.  If you use a stepladder:  Make sure that it is fully opened. Do not climb a closed stepladder.  Make sure that both sides of the stepladder are locked into place.  Ask someone to hold it for you, if possible.  Clearly mark and make sure that you can see:  Any grab bars or handrails.  First and last steps.  Where the edge of each step is.  Use tools that help you move around (mobility aids) if they are needed. These include:  Canes.  Walkers.  Scooters.  Crutches.  Turn on the lights when you go into a dark area. Replace any light bulbs as soon as they burn out.  Set up your furniture so you have a clear path. Avoid moving your furniture around.  If any of your floors are uneven, fix them.  If there are any pets around you, be aware of where they are.  Review your medicines with your doctor. Some medicines can make you feel dizzy. This can increase your chance of falling. Ask your doctor what other things that you can do to help prevent falls.   This information is not intended to replace advice given to you by your health care provider. Make sure you discuss any questions you have with your health care provider.   Document Released: 08/24/2009 Document Revised: 03/14/2015 Document Reviewed: 12/02/2014 Elsevier Interactive Patient Education 2016 [place] Maintenance, Female Adopting a healthy lifestyle and getting preventive care can go a long way to promote health and wellness. Talk with your health care provider about what schedule of regular examinations is right for you. This is a good chance for you to check in with your provider about disease prevention  and staying healthy. In between checkups, there are plenty of things you can do on your own. Experts have done a lot of research about which lifestyle changes and preventive measures are most likely to keep you healthy. Ask your health care provider for more information. WEIGHT AND DIET  Eat a healthy diet  Be sure to include plenty of vegetables, fruits, low-fat dairy products, and lean protein.  Do not eat a lot of foods high in solid fats, added sugars, or salt.  Get regular exercise. This is one of the most important things you can do for your health.  Most adults should exercise for at least 150 minutes each week. The exercise should increase your heart rate and make you sweat (moderate-intensity exercise).  Most adults should also do strengthening exercises at least twice a week. This is in addition to the moderate-intensity exercise.  Maintain a healthy weight  Body mass index (BMI) is a measurement that can be used to identify possible weight problems. It estimates body fat based on height and weight. Your health care provider can help determine your BMI and help you achieve or maintain a healthy weight.  For females 16 years of age and older:   A BMI below 18.5 is considered underweight.  A BMI of 18.5 to 24.9 is normal.  A BMI of 25 to 29.9 is considered overweight.  A BMI of 30 and above is considered obese.  Watch levels of cholesterol and blood lipids  You should start having your blood tested for lipids and cholesterol at 73 years of age, then have this test every 5 years.  You may need to have your cholesterol levels checked more often if:  Your lipid or cholesterol levels are high.  You are older than 73 years of age.  You are at high risk for heart disease.  CANCER SCREENING   Lung Cancer  Lung cancer screening is recommended for adults 66-53 years old who are at high risk for lung cancer because of a history of smoking.  A yearly low-dose CT scan of  the lungs is recommended for people who:  Currently smoke.  Have quit within the past 15 years.  Have at least a 30-pack-year history of smoking. A pack year is smoking an average of one pack of cigarettes a day for 1 year.  Yearly screening should continue until it has been 15 years since you quit.  Yearly screening should stop if you develop a health problem that would prevent you from having lung cancer treatment.  Breast Cancer  Practice breast self-awareness. This means understanding how your breasts normally appear and feel.  It also means doing regular breast self-exams. Let your health care provider know about any changes, no matter how small.  If you are in your 20s or 30s, you should have a clinical breast exam (CBE) by a health care provider every 1-3 years as part of a regular health exam.  If you are 46 or older, have a CBE every year. Also consider having a breast X-ray (mammogram) every year.  If you have a family history of breast cancer, talk to your health care provider about genetic screening.  If you are at high risk for breast cancer, talk to your health care provider about having an MRI and a mammogram every year.  Breast cancer gene (BRCA) assessment is recommended for women who have family members with BRCA-related cancers. BRCA-related cancers include:  Breast.  Ovarian.  Tubal.  Peritoneal cancers.  Results of the assessment will determine the need for genetic counseling and BRCA1 and BRCA2 testing. Cervical Cancer Your health care provider may recommend that you be screened regularly for cancer of the pelvic organs (ovaries, uterus, and vagina). This screening involves a pelvic examination, including checking for microscopic changes to the surface of your cervix (  Pap test). You may be encouraged to have this screening done every 3 years, beginning at age 20.  For women ages 5-65, health care providers may recommend pelvic exams and Pap testing every  3 years, or they may recommend the Pap and pelvic exam, combined with testing for human papilloma virus (HPV), every 5 years. Some types of HPV increase your risk of cervical cancer. Testing for HPV may also be done on women of any age with unclear Pap test results.  Other health care providers may not recommend any screening for nonpregnant women who are considered low risk for pelvic cancer and who do not have symptoms. Ask your health care provider if a screening pelvic exam is right for you.  If you have had past treatment for cervical cancer or a condition that could lead to cancer, you need Pap tests and screening for cancer for at least 20 years after your treatment. If Pap tests have been discontinued, your risk factors (such as having a new sexual partner) need to be reassessed to determine if screening should resume. Some women have medical problems that increase the chance of getting cervical cancer. In these cases, your health care provider may recommend more frequent screening and Pap tests. Colorectal Cancer  This type of cancer can be detected and often prevented.  Routine colorectal cancer screening usually begins at 73 years of age and continues through 74 years of age.  Your health care provider may recommend screening at an earlier age if you have risk factors for colon cancer.  Your health care provider may also recommend using home test kits to check for hidden blood in the stool.  A small camera at the end of a tube can be used to examine your colon directly (sigmoidoscopy or colonoscopy). This is done to check for the earliest forms of colorectal cancer.  Routine screening usually begins at age 62.  Direct examination of the colon should be repeated every 5-10 years through 73 years of age. However, you may need to be screened more often if early forms of precancerous polyps or small growths are found. Skin Cancer  Check your skin from head to toe regularly.  Tell your  health care provider about any new moles or changes in moles, especially if there is a change in a mole's shape or color.  Also tell your health care provider if you have a mole that is larger than the size of a pencil eraser.  Always use sunscreen. Apply sunscreen liberally and repeatedly throughout the day.  Protect yourself by wearing long sleeves, pants, a wide-brimmed hat, and sunglasses whenever you are outside. HEART DISEASE, DIABETES, AND HIGH BLOOD PRESSURE   High blood pressure causes heart disease and increases the risk of stroke. High blood pressure is more likely to develop in:  People who have blood pressure in the high end of the normal range (130-139/85-89 mm Hg).  People who are overweight or obese.  People who are African American.  If you are 57-5 years of age, have your blood pressure checked every 3-5 years. If you are 49 years of age or older, have your blood pressure checked every year. You should have your blood pressure measured twice--once when you are at a hospital or clinic, and once when you are not at a hospital or clinic. Record the average of the two measurements. To check your blood pressure when you are not at a hospital or clinic, you can use:  An automated blood pressure  machine at a pharmacy.  A home blood pressure monitor.  If you are between 53 years and 15 years old, ask your health care provider if you should take aspirin to prevent strokes.  Have regular diabetes screenings. This involves taking a blood sample to check your fasting blood sugar level.  If you are at a normal weight and have a low risk for diabetes, have this test once every three years after 73 years of age.  If you are overweight and have a high risk for diabetes, consider being tested at a younger age or more often. PREVENTING INFECTION  Hepatitis B  If you have a higher risk for hepatitis B, you should be screened for this virus. You are considered at high risk for  hepatitis B if:  You were born in a country where hepatitis B is common. Ask your health care provider which countries are considered high risk.  Your parents were born in a high-risk country, and you have not been immunized against hepatitis B (hepatitis B vaccine).  You have HIV or AIDS.  You use needles to inject street drugs.  You live with someone who has hepatitis B.  You have had sex with someone who has hepatitis B.  You get hemodialysis treatment.  You take certain medicines for conditions, including cancer, organ transplantation, and autoimmune conditions. Hepatitis C  Blood testing is recommended for:  Everyone born from [birth year] through 1963.  Anyone with known risk factors for hepatitis C. Sexually transmitted infections (STIs)  You should be screened for sexually transmitted infections (STIs) including gonorrhea and chlamydia if:  You are sexually active and are younger than 73 years of age.  You are older than 73 years of age and your health care provider tells you that you are at risk for this type of infection.  Your sexual activity has changed since you were last screened and you are at an increased risk for chlamydia or gonorrhea. Ask your health care provider if you are at risk.  If you do not have HIV, but are at risk, it may be recommended that you take a prescription medicine daily to prevent HIV infection. This is called pre-exposure prophylaxis (PrEP). You are considered at risk if:  You are sexually active and do not regularly use condoms or know the HIV status of your partner(s).  You take drugs by injection.  You are sexually active with a partner who has HIV. Talk with your health care provider about whether you are at high risk of being infected with HIV. If you choose to begin PrEP, you should first be tested for HIV. You should then be tested every 3 months for as long as you are taking PrEP.  PREGNANCY   If you are premenopausal and you may  become pregnant, ask your health care provider about preconception counseling.  If you may become pregnant, take 400 to 800 micrograms (mcg) of folic acid every day.  If you want to prevent pregnancy, talk to your health care provider about birth control (contraception). OSTEOPOROSIS AND MENOPAUSE   Osteoporosis is a disease in which the bones lose minerals and strength with aging. This can result in serious bone fractures. Your risk for osteoporosis can be identified using a bone density scan.  If you are 84 years of age or older, or if you are at risk for osteoporosis and fractures, ask your health care provider if you should be screened.  Ask your health care provider whether you should take  a calcium or vitamin D supplement to lower your risk for osteoporosis.  Menopause may have certain physical symptoms and risks.  Hormone replacement therapy may reduce some of these symptoms and risks. Talk to your health care provider about whether hormone replacement therapy is right for you.  HOME CARE INSTRUCTIONS   Schedule regular health, dental, and eye exams.  Stay current with your immunizations.   Do not use any tobacco products including cigarettes, chewing tobacco, or electronic cigarettes.  If you are pregnant, do not drink alcohol.  If you are breastfeeding, limit how much and how often you drink alcohol.  Limit alcohol intake to no more than 1 drink per day for nonpregnant women. One drink equals 12 ounces of beer, 5 ounces of wine, or 1 ounces of hard liquor.  Do not use street drugs.  Do not share needles.  Ask your health care provider for help if you need support or information about quitting drugs.  Tell your health care provider if you often feel depressed.  Tell your health care provider if you have ever been abused or do not feel safe at home.   This information is not intended to replace advice given to you by your health care provider. Make sure you discuss  any questions you have with your health care provider.   Document Released: 05/13/2011 Document Revised: 11/18/2014 Document Reviewed: 09/29/2013 Elsevier Interactive Patient Education [employer].

## 2016-07-19 ENCOUNTER — Telehealth: Payer: Self-pay | Admitting: Internal Medicine

## 2016-07-19 MED ORDER — PREDNISONE 10 MG PO TABS: ORAL_TABLET | ORAL | 0 refills | Status: DC

## 2016-07-19 NOTE — Telephone Encounter (Signed)
Spoke with pt and gave recommendations. Rx sent to pharmacy. Pt advised to keep appt with MR on Monday. Nothing further needed.

## 2016-07-19 NOTE — Telephone Encounter (Signed)
Spoke with pt and she states that she has increased cough with yellow mucus, increased ShOB, wheeze and chest tightness. Pt reports symptoms for several days. Pt has been using albuterol HFA twice a day for several days. Pt denies f/n/v. Pt also reports O2 has been at 98%.  Pt is scheduled with MR on Monday but does not feel like she can wait until then for meds.  MR is not in office MW - Please advise. Thanks!  LOV  05/17/16  Instructions   Patient Instructions    Moderate persistent asthma, uncomplicated - despite lot of symptoms nitric oxide test suggests good control  Plan  - continue singulair, xolair and symbicoprt - Hold off treatment for an exacerbation but do call us if things are getting worse. - Follow-up in 3 months at which time we will check spirometry and correlated with the nitric oxide test  Skin lesion of the shin bilaterally  -  I do not know what this is. However I sincerely doubt this is due to [place]. Nevertheless we will get dermatology opinion. If they think this with blood test. erythema nodosum then we will check for sarcoidosis    Followup  = 3 months or sooner if needed                       - spirometry at pft lab (pre-and postbronchodilator but normal lung volumes or DLCO] in 3 months                       - Nitric oxide testing at follow-up and asthma control questionnaire at follow-up in 3 months

## 2016-07-19 NOTE — Progress Notes (Signed)
Medical screening examination/treatment/procedure(s) were performed by non-physician practitioner and as supervising physician I was immediately available for consultation/collaboration. I agree with documentation. Hoyt Koch, MD

## 2016-07-19 NOTE — Telephone Encounter (Signed)
Prednisone 10 mg take  4 each am x 2 days,   2 each am x 2 days,  1 each am x 2 days and stop  

## 2016-07-22 ENCOUNTER — Encounter: Payer: Self-pay | Admitting: Internal Medicine

## 2016-07-22 ENCOUNTER — Ambulatory Visit: Payer: Medicare Other | Admitting: Internal Medicine

## 2016-07-22 ENCOUNTER — Ambulatory Visit (INDEPENDENT_AMBULATORY_CARE_PROVIDER_SITE_OTHER): Payer: Medicare Other | Admitting: Internal Medicine

## 2016-07-22 VITALS — BP 168/98 | HR 68 | Ht 63.0 in | Wt 171.0 lb

## 2016-07-22 DIAGNOSIS — J4540 Moderate persistent asthma, uncomplicated: Secondary | ICD-10-CM

## 2016-07-22 DIAGNOSIS — D721 Eosinophilia, unspecified: Secondary | ICD-10-CM
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
## 2016-07-22 NOTE — Patient Instructions (Signed)
ICD-9-CM ICD-10-CM   1. Moderate persistent asthma, uncomplicated 123456 123456   2. 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

## 2016-07-23 ENCOUNTER — Encounter (HOSPITAL_COMMUNITY): Payer: Self-pay

## 2016-07-23 NOTE — Progress Notes (Unsigned)
The patient called in to state the Dermatologist has given her Fluocinonide for her rash; not sure of dosage.

## 2016-07-23 NOTE — Telephone Encounter (Signed)
Call placed to Ms. [name] [name]; Agreed to come in for fup for pain management;   Apt made for Friday the 15th at 1:15;  Sh

## 2016-07-23 NOTE — Telephone Encounter (Signed)
Ms. Wiechman called back to [place];  Stated she has decided to change practitioners. Referred her back to the [place] office to reschedule an apt.

## 2016-07-25 ENCOUNTER — Encounter (HOSPITAL_COMMUNITY)
Admission: RE | Admit: 2016-07-25 | Discharge: 2016-07-25 | Disposition: A | Discharge status: Home / Self Care | Payer: Self-pay | Source: Ambulatory Visit | Attending: Pulmonary Disease | Admitting: Pulmonary Disease

## 2016-07-26 ENCOUNTER — Ambulatory Visit: Payer: Medicare Other | Admitting: Internal Medicine

## 2016-07-26 ENCOUNTER — Telehealth: Payer: Self-pay | Admitting: Internal Medicine

## 2016-07-26 NOTE — Telephone Encounter (Signed)
Patient had question and is requesting call back.

## 2016-07-26 NOTE — Telephone Encounter (Signed)
I think this patient should stay with her current PCP. I do not manage chronic pain. Thank you

## 2016-07-26 NOTE — Telephone Encounter (Signed)
Patient is requesting to transfer from [place] to [place].  Patient is requesting visit for pain management referral.

## 2016-07-30 ENCOUNTER — Ambulatory Visit: Payer: Medicare Other

## 2016-07-30 NOTE — Telephone Encounter (Signed)
Left patient vm to call back to give response.

## 2016-07-31 ENCOUNTER — Telehealth: Payer: Self-pay

## 2016-07-31 NOTE — Telephone Encounter (Signed)
Rec'd call from front desk who reported the patient request to transfer but they are not doing internal transfers at this office at this time. The patient requested I call. Called her back and LVM to call on direct line this pm. Will be at [employer].

## 2016-07-31 NOTE — Telephone Encounter (Signed)
Call Rec'd back from Ms. Deichmann, Stated she has been in to see pulmonologist; going through pul rehab. Not sure if she will continue on pul shot, it is helping asthma but makes her to jittery.  Discussed fup apt with [name] and agreed to come back for pain management referral. Apt confirmed

## 2016-08-01 ENCOUNTER — Encounter (HOSPITAL_COMMUNITY)
Admission: RE | Admit: 2016-08-01 | Discharge: 2016-08-01 | Disposition: A | Discharge status: Home / Self Care | Payer: Self-pay | Source: Ambulatory Visit | Attending: Pulmonary Disease | Admitting: Pulmonary Disease

## 2016-08-01 ENCOUNTER — Ambulatory Visit (INDEPENDENT_AMBULATORY_CARE_PROVIDER_SITE_OTHER): Payer: Medicare Other

## 2016-08-01 DIAGNOSIS — J4540 Moderate persistent asthma, uncomplicated: Secondary | ICD-10-CM

## 2016-08-05 MED ORDER — OMALIZUMAB 150 MG ~~LOC~~ SOLR
150.0000 mg | SUBCUTANEOUS | Status: DC
  Administered 2016-08-01: 150 mg via SUBCUTANEOUS

## 2016-08-06 ENCOUNTER — Encounter (HOSPITAL_COMMUNITY)
Admission: RE | Admit: 2016-08-06 | Discharge: 2016-08-06 | Disposition: A | Discharge status: Home / Self Care | Payer: Self-pay | Source: Ambulatory Visit | Attending: Pulmonary Disease | Admitting: Pulmonary Disease

## 2016-08-07 ENCOUNTER — Ambulatory Visit: Payer: Medicare Other | Admitting: Internal Medicine

## 2016-08-08 ENCOUNTER — Encounter (HOSPITAL_COMMUNITY)
Admission: RE | Admit: 2016-08-08 | Discharge: 2016-08-08 | Disposition: A | Discharge status: Home / Self Care | Payer: Self-pay | Source: Ambulatory Visit | Attending: Pulmonary Disease | Admitting: Pulmonary Disease

## 2016-08-13 ENCOUNTER — Encounter (HOSPITAL_COMMUNITY): Payer: Self-pay

## 2016-08-13 DIAGNOSIS — K219 Gastro-esophageal reflux disease without esophagitis: Secondary | ICD-10-CM | POA: Insufficient documentation

## 2016-08-13 DIAGNOSIS — J45909 Unspecified asthma, uncomplicated: Secondary | ICD-10-CM | POA: Insufficient documentation

## 2016-08-13 DIAGNOSIS — J449 Chronic obstructive pulmonary disease, unspecified: Secondary | ICD-10-CM | POA: Insufficient documentation

## 2016-08-13 DIAGNOSIS — M810 Age-related osteoporosis without current pathological fracture: Secondary | ICD-10-CM | POA: Insufficient documentation

## 2016-08-13 DIAGNOSIS — Z5189 Encounter for other specified aftercare: Secondary | ICD-10-CM | POA: Insufficient documentation

## 2016-08-13 DIAGNOSIS — G4730 Sleep apnea, unspecified: Secondary | ICD-10-CM | POA: Insufficient documentation

## 2016-08-15 ENCOUNTER — Encounter (HOSPITAL_COMMUNITY)
Admission: RE | Admit: 2016-08-15 | Discharge: 2016-08-15 | Disposition: A | Discharge status: Home / Self Care | Payer: Self-pay | Source: Ambulatory Visit | Attending: Pulmonary Disease | Admitting: Pulmonary Disease

## 2016-08-15 ENCOUNTER — Telehealth: Payer: Self-pay | Admitting: Internal Medicine

## 2016-08-15 NOTE — Telephone Encounter (Signed)
#   vials:1 Ordered date:08/15/16 Shipping Date:08/16/16

## 2016-08-16 NOTE — Telephone Encounter (Signed)
#   Vials:1 Arrival Date:08/16/16 Lot [id number] Exp Date:4/21

## 2016-08-19 ENCOUNTER — Ambulatory Visit (INDEPENDENT_AMBULATORY_CARE_PROVIDER_SITE_OTHER): Payer: Medicare Other | Admitting: Internal Medicine

## 2016-08-19 ENCOUNTER — Encounter: Payer: Self-pay | Admitting: Internal Medicine

## 2016-08-19 ENCOUNTER — Other Ambulatory Visit (INDEPENDENT_AMBULATORY_CARE_PROVIDER_SITE_OTHER): Payer: Medicare Other

## 2016-08-19 VITALS — BP 128/66 | HR 60 | Temp 98.3°F | Ht 63.0 in | Wt 169.4 lb

## 2016-08-19 DIAGNOSIS — J4541 Moderate persistent asthma with (acute) exacerbation: Secondary | ICD-10-CM | POA: Diagnosis not present

## 2016-08-19 DIAGNOSIS — I2510 Atherosclerotic heart disease of native coronary artery without angina pectoris: Secondary | ICD-10-CM | POA: Diagnosis not present

## 2016-08-19 DIAGNOSIS — I2584 Coronary atherosclerosis due to calcified coronary lesion: Secondary | ICD-10-CM | POA: Diagnosis not present

## 2016-08-19 DIAGNOSIS — R918 Other nonspecific abnormal finding of lung field: Secondary | ICD-10-CM

## 2016-08-19 DIAGNOSIS — D721 Eosinophilia, unspecified: Secondary | ICD-10-CM

## 2016-08-19 LAB — CBC WITH DIFFERENTIAL/PLATELET
BASOS ABS: 0.1 10*3/uL (ref 0.0–0.1)
Basophils Relative: 0.7 % (ref 0.0–3.0)
EOS ABS: 0.7 10*3/uL (ref 0.0–0.7)
Eosinophils Relative: 9 % — ABNORMAL HIGH (ref 0.0–5.0)
HEMATOCRIT: 38.4 % (ref 36.0–46.0)
HEMOGLOBIN: 12.6 g/dL (ref 12.0–15.0)
LYMPHS PCT: 26.8 % (ref 12.0–46.0)
Lymphs Abs: 2.2 10*3/uL (ref 0.7–4.0)
MCHC: 32.8 g/dL (ref 30.0–36.0)
MCV: 85.8 fl (ref 78.0–100.0)
Monocytes Absolute: 0.7 10*3/uL (ref 0.1–1.0)
Monocytes Relative: 8.6 % (ref 3.0–12.0)
Neutro Abs: 4.6 10*3/uL (ref 1.4–7.7)
Neutrophils Relative %: 54.9 % (ref 43.0–77.0)
Platelets: 227 10*3/uL (ref 150.0–400.0)
RBC: 4.48 Mil/uL (ref 3.87–5.11)
RDW: 13.5 % (ref 11.5–15.5)
WBC: 8.3 10*3/uL (ref 4.0–10.5)

## 2016-08-19 MED ORDER — PREDNISONE 10 MG PO TABS: ORAL_TABLET | ORAL | 0 refills | Status: DC

## 2016-08-19 MED ORDER — AZITHROMYCIN 250 MG PO TABS: ORAL_TABLET | ORAL | 0 refills | Status: DC
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
## 2016-08-19 NOTE — Patient Instructions (Addendum)
ICD-9-CM ICD-10-CM   1. Moderate persistent asthma with acute exacerbation 493.92 J45.41   2. Lung nodules 793.19 R91.8   3. Coronary artery calcification 414.00 I25.10    414.4 I25.84    #asthma exacerbation  - do cbc with diff to asses eosinophil load - you keep skipping this  - Take prednisone 40 mg daily x 2 days, then 20mg  daily x 2 days, then 10mg  daily x 2 days, then 5mg  daily x 2 days and stop - Z pak - due to recurrent asthma I recommend CT sinus (in 2004 you had chronic sinus congestion)  #Lung nodules  -CT chest wo contrast as followup  #Co. Art calcification  - not sure why you have not had stress test in our records  - wil investigate  Followup 3 months or sooner if needed

## 2016-08-20 ENCOUNTER — Encounter (HOSPITAL_COMMUNITY): Payer: Self-pay

## 2016-08-22 ENCOUNTER — Encounter (HOSPITAL_COMMUNITY): Payer: Self-pay

## 2016-08-22 NOTE — Progress Notes (Signed)
lmtcb for pt.  

## 2016-08-23 ENCOUNTER — Telehealth: Payer: Self-pay | Admitting: Internal Medicine

## 2016-08-23 NOTE — Telephone Encounter (Signed)
I spoke with pt and notified of lab results per MR- Notes Recorded by Brand Males, MD on 08/19/2016 at 12:45 PM EDT Eosinophils very high; she should consider change to nucala instead of xolair. ASk her if interested  She verbalized understanding   She states that she is not wanting to switch w/o speaking with MR personally  She wanted to make appt to see him before her next scheduled xolair on 08/31/16  Nothing available  Please advise thanks

## 2016-08-23 NOTE — Telephone Encounter (Signed)
Pt returning call for results.[name] [name] ° °

## 2016-08-23 NOTE — Progress Notes (Signed)
Spoke with pt and notified of results per [name].

## 2016-08-25 ENCOUNTER — Other Ambulatory Visit: Payer: Self-pay | Admitting: Internal Medicine

## 2016-08-26 NOTE — Telephone Encounter (Signed)
Called and spoke with pt and she stated that she would rather see MR since he knows all about her.  She has scheduled an appt with MR on 11/30 at 415 and she will discuss med change at that time.

## 2016-08-26 NOTE — Telephone Encounter (Signed)
Patient is scheduled to have her next Xolair shot on 08/29/16.  Attempted to contact patient to confirm appointment and to schedule her to see NP since [name] is not in office the week of her injection.  Awaiting call back from patient.

## 2016-08-26 NOTE — Telephone Encounter (Signed)
lmomtcb x 2  

## 2016-08-26 NOTE — Telephone Encounter (Signed)
Ok to have xolair but I would like  To initiate change to nucala - till then continue xolair - this has been discussed with her in past  Dr. Brand Males, M.D., [facility].C.P Pulmonary and Critical Care Medicine Staff Physician [place] [facility] Pager: [phone], If no answer or between  15:00h - 7:00h: call [phone]  08/26/2016 1:27 PM

## 2016-08-27 ENCOUNTER — Encounter (HOSPITAL_COMMUNITY)
Admission: RE | Admit: 2016-08-27 | Discharge: 2016-08-27 | Disposition: A | Discharge status: Home / Self Care | Payer: Self-pay | Source: Ambulatory Visit | Attending: Pulmonary Disease | Admitting: Pulmonary Disease

## 2016-08-29 ENCOUNTER — Ambulatory Visit: Payer: Medicare Other

## 2016-08-29 ENCOUNTER — Encounter (HOSPITAL_COMMUNITY): Payer: Self-pay

## 2016-08-30 ENCOUNTER — Other Ambulatory Visit: Payer: Self-pay | Admitting: Internal Medicine

## 2016-09-02 ENCOUNTER — Other Ambulatory Visit: Payer: Self-pay | Admitting: *Deleted

## 2016-09-02 ENCOUNTER — Ambulatory Visit (INDEPENDENT_AMBULATORY_CARE_PROVIDER_SITE_OTHER): Payer: Medicare Other

## 2016-09-02 DIAGNOSIS — J4520 Mild intermittent asthma, uncomplicated: Secondary | ICD-10-CM | POA: Diagnosis not present

## 2016-09-02 MED ORDER — CYCLOSPORINE 0.05 % OP EMUL: OPHTHALMIC | 0 refills | Status: DC

## 2016-09-03 ENCOUNTER — Encounter (HOSPITAL_COMMUNITY): Admission: RE | Admit: 2016-09-03 | Payer: Self-pay | Source: Ambulatory Visit

## 2016-09-03 DIAGNOSIS — J4520 Mild intermittent asthma, uncomplicated: Secondary | ICD-10-CM | POA: Diagnosis not present

## 2016-09-03 MED ORDER — OMALIZUMAB 150 MG ~~LOC~~ SOLR
150.0000 mg | SUBCUTANEOUS | Status: DC
  Administered 2016-09-03: 150 mg via SUBCUTANEOUS

## 2016-09-05 ENCOUNTER — Encounter (HOSPITAL_COMMUNITY)
Admission: RE | Admit: 2016-09-05 | Discharge: 2016-09-05 | Disposition: A | Discharge status: Home / Self Care | Payer: Self-pay | Source: Ambulatory Visit | Attending: Pulmonary Disease | Admitting: Pulmonary Disease

## 2016-09-06 ENCOUNTER — Telehealth: Payer: Self-pay | Admitting: Internal Medicine

## 2016-09-06 NOTE — Telephone Encounter (Signed)
#   vials:1 Ordered date:09/06/16 Shipping Date:09/09/16

## 2016-09-09 NOTE — Telephone Encounter (Signed)
#   Vials:1 Arrival Date:09/09/16 Lot [id number] Exp Date:4/21

## 2016-09-10 ENCOUNTER — Encounter (HOSPITAL_COMMUNITY): Payer: Self-pay

## 2016-09-12 ENCOUNTER — Encounter (HOSPITAL_COMMUNITY)
Admission: RE | Admit: 2016-09-12 | Discharge: 2016-09-12 | Disposition: A | Discharge status: Home / Self Care | Payer: Self-pay | Source: Ambulatory Visit | Attending: Pulmonary Disease | Admitting: Pulmonary Disease

## 2016-09-12 DIAGNOSIS — J45909 Unspecified asthma, uncomplicated: Secondary | ICD-10-CM | POA: Insufficient documentation

## 2016-09-12 DIAGNOSIS — K219 Gastro-esophageal reflux disease without esophagitis: Secondary | ICD-10-CM | POA: Insufficient documentation

## 2016-09-12 DIAGNOSIS — Z5189 Encounter for other specified aftercare: Secondary | ICD-10-CM | POA: Insufficient documentation

## 2016-09-12 DIAGNOSIS — J449 Chronic obstructive pulmonary disease, unspecified: Secondary | ICD-10-CM | POA: Insufficient documentation

## 2016-09-12 DIAGNOSIS — G4730 Sleep apnea, unspecified: Secondary | ICD-10-CM | POA: Insufficient documentation

## 2016-09-12 DIAGNOSIS — M810 Age-related osteoporosis without current pathological fracture: Secondary | ICD-10-CM | POA: Insufficient documentation

## 2016-09-16 ENCOUNTER — Other Ambulatory Visit: Payer: Self-pay | Admitting: Internal Medicine

## 2016-09-16 DIAGNOSIS — R921 Mammographic calcification found on diagnostic imaging of breast: Secondary | ICD-10-CM

## 2016-09-17 ENCOUNTER — Encounter (HOSPITAL_COMMUNITY)
Admission: RE | Admit: 2016-09-17 | Discharge: 2016-09-17 | Disposition: A | Discharge status: Home / Self Care | Payer: Self-pay | Source: Ambulatory Visit | Attending: Pulmonary Disease | Admitting: Pulmonary Disease

## 2016-09-19 ENCOUNTER — Encounter (HOSPITAL_COMMUNITY)
Admission: RE | Admit: 2016-09-19 | Discharge: 2016-09-19 | Disposition: A | Discharge status: Home / Self Care | Payer: Self-pay | Source: Ambulatory Visit | Attending: Pulmonary Disease | Admitting: Pulmonary Disease

## 2016-09-20 ENCOUNTER — Other Ambulatory Visit: Payer: Self-pay | Admitting: Internal Medicine

## 2016-09-20 NOTE — Telephone Encounter (Signed)
Faxed to pharmacy

## 2016-09-24 ENCOUNTER — Encounter (HOSPITAL_COMMUNITY)
Admission: RE | Admit: 2016-09-24 | Discharge: 2016-09-24 | Disposition: A | Discharge status: Home / Self Care | Payer: Self-pay | Source: Ambulatory Visit | Attending: Pulmonary Disease | Admitting: Pulmonary Disease

## 2016-09-26 ENCOUNTER — Other Ambulatory Visit: Payer: Self-pay | Admitting: Internal Medicine

## 2016-09-26 ENCOUNTER — Encounter (HOSPITAL_COMMUNITY)
Admission: RE | Admit: 2016-09-26 | Discharge: 2016-09-26 | Disposition: A | Discharge status: Home / Self Care | Payer: Self-pay | Source: Ambulatory Visit | Attending: Pulmonary Disease | Admitting: Pulmonary Disease

## 2016-10-01 ENCOUNTER — Encounter (HOSPITAL_COMMUNITY): Payer: Self-pay

## 2016-10-03 ENCOUNTER — Encounter (HOSPITAL_COMMUNITY): Payer: Self-pay

## 2016-10-04 ENCOUNTER — Ambulatory Visit: Payer: Medicare Other

## 2016-10-07 ENCOUNTER — Ambulatory Visit (INDEPENDENT_AMBULATORY_CARE_PROVIDER_SITE_OTHER): Payer: Medicare Other

## 2016-10-07 ENCOUNTER — Telehealth: Payer: Self-pay | Admitting: Internal Medicine

## 2016-10-07 DIAGNOSIS — J4520 Mild intermittent asthma, uncomplicated: Secondary | ICD-10-CM | POA: Diagnosis not present

## 2016-10-07 MED ORDER — BUDESONIDE-FORMOTEROL FUMARATE 160-4.5 MCG/ACT IN AERO: 2.0000 | INHALATION_SPRAY | Freq: Two times a day (BID) | RESPIRATORY_TRACT | 0 refills | Status: DC

## 2016-10-07 NOTE — Telephone Encounter (Signed)
LM letting patient know that her samples are up front.  Note left in allergy to make sure that patient is made aware when she comes in.  Nothing further needed.

## 2016-10-08 ENCOUNTER — Encounter (HOSPITAL_COMMUNITY): Payer: Self-pay

## 2016-10-08 DIAGNOSIS — J4520 Mild intermittent asthma, uncomplicated: Secondary | ICD-10-CM

## 2016-10-08 MED ORDER — OMALIZUMAB 150 MG ~~LOC~~ SOLR
150.0000 mg | SUBCUTANEOUS | Status: DC
  Administered 2016-10-08: 150 mg via SUBCUTANEOUS

## 2016-10-10 ENCOUNTER — Encounter: Payer: Self-pay | Admitting: Internal Medicine

## 2016-10-10 ENCOUNTER — Ambulatory Visit (INDEPENDENT_AMBULATORY_CARE_PROVIDER_SITE_OTHER): Payer: Medicare Other | Admitting: Internal Medicine

## 2016-10-10 ENCOUNTER — Encounter (HOSPITAL_COMMUNITY)
Admission: RE | Admit: 2016-10-10 | Discharge: 2016-10-10 | Disposition: A | Discharge status: Home / Self Care | Payer: Self-pay | Source: Ambulatory Visit | Attending: Pulmonary Disease | Admitting: Pulmonary Disease

## 2016-10-10 VITALS — BP 156/80 | HR 69 | Temp 98.3°F | Ht 63.0 in | Wt 176.0 lb

## 2016-10-10 DIAGNOSIS — J4541 Moderate persistent asthma with (acute) exacerbation: Secondary | ICD-10-CM | POA: Diagnosis not present

## 2016-10-10 DIAGNOSIS — D721 Eosinophilia, unspecified: Secondary | ICD-10-CM

## 2016-10-10 DIAGNOSIS — I2510 Atherosclerotic heart disease of native coronary artery without angina pectoris: Secondary | ICD-10-CM

## 2016-10-10 DIAGNOSIS — I2584 Coronary atherosclerosis due to calcified coronary lesion: Secondary | ICD-10-CM | POA: Diagnosis not present

## 2016-10-10 DIAGNOSIS — R918 Other nonspecific abnormal finding of lung field: Secondary | ICD-10-CM | POA: Diagnosis not present

## 2016-10-10 MED ORDER — PREDNISONE 10 MG (21) PO TBPK: ORAL_TABLET | ORAL | 0 refills | Status: DC

## 2016-10-10 MED ORDER — PREDNISONE 10 MG PO TABS: ORAL_TABLET | ORAL | 0 refills | Status: DC

## 2016-10-10 MED ORDER — TIOTROPIUM BROMIDE MONOHYDRATE 2.5 MCG/ACT IN AERS: 2.0000 | INHALATION_SPRAY | Freq: Every day | RESPIRATORY_TRACT | 11 refills | Status: DC

## 2016-10-10 MED ORDER — AZITHROMYCIN 250 MG PO TABS: ORAL_TABLET | ORAL | 0 refills | Status: DC

## 2016-10-10 NOTE — Addendum Note (Signed)
Addended by: Valerie Salts on: 10/10/2016 05:40 PM   Modules accepted: Orders

## 2016-10-10 NOTE — Patient Instructions (Addendum)
ICD-9-CM ICD-10-CM   1. Moderate persistent asthma with acute exacerbation 493.92 J45.41   2. 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   3. Coronary artery calcification 414.00 I25.10    414.4 I25.84   4. Lung nodules 793.19 R91.8     #asthma exacerbation - recurrent  - - Take prednisone 40 mg daily x 2 days, then 20mg  daily x 2 days, then 10mg  daily x 2 days, then 5mg  daily x 2 days and stop - Z pak repeat - Do cT sinus  - due to recurrent asthma I recommend CT sinus (in 2004 you had chronic sinus congestion) - continue singulair, xolair, clariti, symbicort  =- sttart spiriva respimat - start paper work to change xolair to [employer]; till then continue xolair  #Lung nodules   -CT chest wo contrast   #Co. Art calcification in 2016 Refer cards  Followup 3 months or sooner if needed
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
## 2016-10-14 MED ORDER — METHYLPREDNISOLONE ACETATE 80 MG/ML IJ SUSP: 80.0000 mg | Freq: Once | INTRAMUSCULAR | Status: DC

## 2016-10-14 NOTE — Addendum Note (Signed)
Addended by: Collier Salina on: 10/14/2016 10:39 AM   Modules accepted: Orders

## 2016-10-15 ENCOUNTER — Telehealth: Payer: Self-pay | Admitting: Internal Medicine

## 2016-10-15 ENCOUNTER — Encounter (HOSPITAL_COMMUNITY)
Admission: RE | Admit: 2016-10-15 | Discharge: 2016-10-15 | Disposition: A | Discharge status: Home / Self Care | Payer: Self-pay | Source: Ambulatory Visit | Attending: Pulmonary Disease | Admitting: Pulmonary Disease

## 2016-10-15 DIAGNOSIS — J449 Chronic obstructive pulmonary disease, unspecified: Secondary | ICD-10-CM | POA: Insufficient documentation

## 2016-10-15 DIAGNOSIS — J45909 Unspecified asthma, uncomplicated: Secondary | ICD-10-CM | POA: Insufficient documentation

## 2016-10-15 DIAGNOSIS — Z5189 Encounter for other specified aftercare: Secondary | ICD-10-CM | POA: Insufficient documentation

## 2016-10-15 DIAGNOSIS — G4730 Sleep apnea, unspecified: Secondary | ICD-10-CM | POA: Insufficient documentation

## 2016-10-15 DIAGNOSIS — K219 Gastro-esophageal reflux disease without esophagitis: Secondary | ICD-10-CM | POA: Insufficient documentation

## 2016-10-15 DIAGNOSIS — M810 Age-related osteoporosis without current pathological fracture: Secondary | ICD-10-CM | POA: Insufficient documentation

## 2016-10-15 NOTE — Telephone Encounter (Signed)
#   vials:1 Ordered date:10/15/16 Shipping Date:10/16/16

## 2016-10-16 NOTE — Telephone Encounter (Signed)
#   Vials:1 Arrival Date:10/16/16 Lot [id number] Exp Date:5/21

## 2016-10-17 ENCOUNTER — Encounter (HOSPITAL_COMMUNITY): Admission: RE | Admit: 2016-10-17 | Payer: Self-pay | Source: Ambulatory Visit

## 2016-10-18 ENCOUNTER — Inpatient Hospital Stay: Admission: RE | Admit: 2016-10-18 | Payer: Medicare Other | Source: Ambulatory Visit

## 2016-10-19 ENCOUNTER — Telehealth: Payer: Self-pay | Admitting: Internal Medicine

## 2016-10-19 NOTE — Telephone Encounter (Signed)
Needs new neb machine Fax # [phone]   Sent

## 2016-10-21 ENCOUNTER — Telehealth: Payer: Self-pay | Admitting: Internal Medicine

## 2016-10-21 ENCOUNTER — Ambulatory Visit: Payer: Medicare Other | Admitting: Physician Assistant

## 2016-10-21 ENCOUNTER — Inpatient Hospital Stay: Admission: RE | Admit: 2016-10-21 | Payer: Medicare Other | Source: Ambulatory Visit

## 2016-10-21 NOTE — Telephone Encounter (Signed)
Nucala is very costly as a buy and bill; [facility] does not have funds to help pay for medication for patient. MR please advise if you are okay with getting patient Cinqair injections monthly under her hospital benefits or getting  Faserna-newest injection for patient in office-has help for patient/cost; also its a monthly injection. Thanks.

## 2016-10-21 NOTE — Telephone Encounter (Signed)
Called [facility] at [phone] and spoke to rep. She states the pt is not eligible to use specialty pharmacy but rather [facility]. Elmyra Ricks states the pt has primary Medicare and has Medicaid as secondary, there is a 20% cost that the pt's Medicaid will cover, pt should not owe anything out of pocket.   Katie please advise how to continue with the [facility] process. Thanks.

## 2016-10-22 ENCOUNTER — Encounter (HOSPITAL_COMMUNITY): Payer: Self-pay

## 2016-10-22 NOTE — Telephone Encounter (Signed)
Let us go with [facility] first and then if that does not woirk , try [employer]

## 2016-10-23 ENCOUNTER — Telehealth: Payer: Self-pay | Admitting: Internal Medicine

## 2016-10-23 DIAGNOSIS — J4550 Severe persistent asthma, uncomplicated: Secondary | ICD-10-CM

## 2016-10-23 NOTE — Telephone Encounter (Signed)
lmtcb X1 for [name] [name] at [facility]- unsure of which rx is needed

## 2016-10-24 ENCOUNTER — Encounter (HOSPITAL_COMMUNITY): Payer: Self-pay

## 2016-10-24 ENCOUNTER — Other Ambulatory Visit: Payer: Self-pay

## 2016-10-24 ENCOUNTER — Telehealth: Payer: Self-pay | Admitting: Internal Medicine

## 2016-10-24 ENCOUNTER — Other Ambulatory Visit: Payer: Self-pay | Admitting: Internal Medicine

## 2016-10-24 DIAGNOSIS — R921 Mammographic calcification found on diagnostic imaging of breast: Secondary | ICD-10-CM

## 2016-10-24 NOTE — Telephone Encounter (Signed)
If they do not have then need to send another copy.

## 2016-10-24 NOTE — Telephone Encounter (Signed)
I don't see any form..is this a CMN? Will check with Rodena Piety to see if she has it Please advise, thanks

## 2016-10-24 NOTE — Telephone Encounter (Signed)
Melissa from [facility] stated the form faxed over didn't have provider NPI number. Melissa stated to write the NPI number on form and refax the form or send in another order through epic for nebulizer.

## 2016-10-24 NOTE — Telephone Encounter (Signed)
I think this has been signed. I do not have any sitting in my stack right now.

## 2016-10-24 NOTE — Telephone Encounter (Signed)
[facility] needs order signed and faxed back today.  Please follow up in regard.

## 2016-10-24 NOTE — Telephone Encounter (Signed)
[facility]

## 2016-10-25 ENCOUNTER — Ambulatory Visit
Admission: RE | Admit: 2016-10-25 | Discharge: 2016-10-25 | Disposition: A | Discharge status: Home / Self Care | Payer: Medicare Other | Source: Ambulatory Visit | Attending: Internal Medicine | Admitting: Internal Medicine

## 2016-10-25 DIAGNOSIS — R921 Mammographic calcification found on diagnostic imaging of breast: Secondary | ICD-10-CM | POA: Diagnosis not present

## 2016-10-25 NOTE — Telephone Encounter (Signed)
I just spoke with Melissa with [facility] it is not a CMN that she is looking for. Dr. Melvyn Novas order a New Neb machine on 10/19/16 for this patient and he wrote it on an rx pad and it was faxed. The order should have been placed correctly in Epic with the format for new Neb Machine unless you can get the original rx written order and put Dr. Gustavus Bryant NPI on it and refax to [facility]

## 2016-10-28 NOTE — Telephone Encounter (Signed)
Order sent to [facility] and will close encounter

## 2016-10-29 ENCOUNTER — Ambulatory Visit (INDEPENDENT_AMBULATORY_CARE_PROVIDER_SITE_OTHER)
Admission: RE | Admit: 2016-10-29 | Discharge: 2016-10-29 | Disposition: A | Discharge status: Home / Self Care | Payer: Medicare Other | Source: Ambulatory Visit | Attending: Internal Medicine | Admitting: Internal Medicine

## 2016-10-29 ENCOUNTER — Encounter: Payer: Self-pay | Admitting: Physician Assistant

## 2016-10-29 ENCOUNTER — Encounter (HOSPITAL_COMMUNITY): Payer: Self-pay

## 2016-10-29 ENCOUNTER — Ambulatory Visit (INDEPENDENT_AMBULATORY_CARE_PROVIDER_SITE_OTHER): Payer: Medicare Other | Admitting: Physician Assistant

## 2016-10-29 ENCOUNTER — Other Ambulatory Visit: Payer: Self-pay | Admitting: Internal Medicine

## 2016-10-29 VITALS — BP 152/80 | HR 65 | Ht 63.0 in | Wt 169.1 lb

## 2016-10-29 DIAGNOSIS — J4540 Moderate persistent asthma, uncomplicated: Secondary | ICD-10-CM | POA: Diagnosis not present

## 2016-10-29 DIAGNOSIS — D721 Eosinophilia, unspecified: Secondary | ICD-10-CM

## 2016-10-29 DIAGNOSIS — J4541 Moderate persistent asthma with (acute) exacerbation: Secondary | ICD-10-CM | POA: Diagnosis not present

## 2016-10-29 DIAGNOSIS — R0789 Other chest pain: Secondary | ICD-10-CM | POA: Diagnosis not present

## 2016-10-29 DIAGNOSIS — I10 Essential (primary) hypertension: Secondary | ICD-10-CM | POA: Diagnosis not present

## 2016-10-29 DIAGNOSIS — I2510 Atherosclerotic heart disease of native coronary artery without angina pectoris: Secondary | ICD-10-CM

## 2016-10-29 DIAGNOSIS — I2584 Coronary atherosclerosis due to calcified coronary lesion: Secondary | ICD-10-CM | POA: Diagnosis not present

## 2016-10-29 DIAGNOSIS — J0140 Acute pansinusitis, unspecified: Secondary | ICD-10-CM | POA: Diagnosis not present
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
## 2016-10-29 NOTE — Patient Instructions (Addendum)
Medication Instructions:  1. Your physician recommends that you continue on your current medications as directed. Please refer to the Current Medication list given to you today.  Labwork: NONE  Testing/Procedures: 1. Your physician has requested that you have en exercise stress myoview. For further information please visit [email or web address]. Please follow instruction sheet, as given.  Follow-Up: Your physician wants you to follow-up in: [place] will receive a reminder letter in the mail two months in advance. If you don't receive a letter, please call our office to schedule the follow-up appointment.  Any Other Special Instructions Will Be Listed Below (If Applicable).  If you need a refill on your cardiac medications before your next appointment, please call your pharmacy.

## 2016-10-30 ENCOUNTER — Other Ambulatory Visit: Payer: Self-pay | Admitting: Internal Medicine

## 2016-10-30 ENCOUNTER — Inpatient Hospital Stay: Admission: RE | Admit: 2016-10-30 | Payer: Medicare Other | Source: Ambulatory Visit

## 2016-10-30 NOTE — Telephone Encounter (Signed)
Spoke with Joellen Jersey and she states that in fact the pt would not be able to afford Berna Bue and the best option will be Cinqair.   MR please advise. Thanks.

## 2016-10-31 ENCOUNTER — Telehealth (HOSPITAL_COMMUNITY): Payer: Self-pay | Admitting: *Deleted

## 2016-10-31 ENCOUNTER — Encounter (HOSPITAL_COMMUNITY)
Admission: RE | Admit: 2016-10-31 | Discharge: 2016-10-31 | Disposition: A | Discharge status: Home / Self Care | Payer: Medicare Other | Source: Ambulatory Visit | Attending: Pulmonary Disease | Admitting: Pulmonary Disease

## 2016-10-31 NOTE — Telephone Encounter (Signed)
Ok with [employer]

## 2016-10-31 NOTE — Telephone Encounter (Signed)
Left message on voicemail in reference to upcoming appointment scheduled for 11/06/16 Phone number given for a call back so details instructions can be given.  [name] [name]

## 2016-11-01 ENCOUNTER — Telehealth: Payer: Self-pay | Admitting: Internal Medicine

## 2016-11-01 NOTE — Telephone Encounter (Signed)
[name] [name]   She has complete opacificatio n of sinus - see below - please refer ENT  Thanks  Dr. Brand Males, M.D., [facility].C.P Pulmonary and Critical Care Medicine Staff Physician [place] [facility] Pager: [phone], If no answer or between  15:00h - 7:00h: call [phone]  11/01/2016 12:08 AM      Almost complete opacification right frontal sinus. Air-fluid level left frontal sinus. Almost complete opacification ethmoid sinus air cells bilaterally. Almost complete opacification right maxillary sinus. Air-fluid level and mucosal thickening left maxillary sinus. Opacification sphenoid sinus air cells greater on the right.  Opacification superior and middle turbinate region.  No obvious intracranial or intraorbital extension of inflammatory process or evidence of mass at the olfactory groove level. Prior to any intervention, dedicated sinus CT may be considered.  IMPRESSION: Pansinusitis as detailed above.   Electronically Signed By: Genia Del M.D. On: 10/30/2016 08:12

## 2016-11-01 NOTE — Telephone Encounter (Signed)
Spoke with Joellen Jersey about Cinqair vs. Berna Bue. Joellen Jersey is aware of insurance coverage with both medications.   Katie please advise. Thanks.

## 2016-11-05 ENCOUNTER — Encounter (HOSPITAL_COMMUNITY): Payer: Self-pay

## 2016-11-05 ENCOUNTER — Telehealth: Payer: Self-pay | Admitting: Internal Medicine

## 2016-11-05 ENCOUNTER — Ambulatory Visit (INDEPENDENT_AMBULATORY_CARE_PROVIDER_SITE_OTHER): Payer: Medicare Other

## 2016-11-05 DIAGNOSIS — J4520 Mild intermittent asthma, uncomplicated: Secondary | ICD-10-CM | POA: Diagnosis not present

## 2016-11-05 NOTE — Telephone Encounter (Signed)
Samples of Symbicort have been left up front for pick up. Pt is aware. Nothing further was needed. 

## 2016-11-05 NOTE — Telephone Encounter (Signed)
Patient is requesting samples for Spiriva.[name] [name]

## 2016-11-06 ENCOUNTER — Encounter (HOSPITAL_COMMUNITY): Payer: Medicare Other

## 2016-11-06 DIAGNOSIS — J4520 Mild intermittent asthma, uncomplicated: Secondary | ICD-10-CM

## 2016-11-06 MED ORDER — OMALIZUMAB 150 MG ~~LOC~~ SOLR
150.0000 mg | Freq: Once | SUBCUTANEOUS | Status: AC
  Administered 2016-11-06: 150 mg via SUBCUTANEOUS

## 2016-11-07 ENCOUNTER — Encounter (HOSPITAL_COMMUNITY)
Admission: RE | Admit: 2016-11-07 | Discharge: 2016-11-07 | Disposition: A | Discharge status: Home / Self Care | Payer: Self-pay | Source: Ambulatory Visit | Attending: Pulmonary Disease | Admitting: Pulmonary Disease

## 2016-11-12 ENCOUNTER — Encounter (HOSPITAL_COMMUNITY)
Admission: RE | Admit: 2016-11-12 | Discharge: 2016-11-12 | Disposition: A | Discharge status: Home / Self Care | Payer: Self-pay | Source: Ambulatory Visit | Attending: Pulmonary Disease | Admitting: Pulmonary Disease

## 2016-11-12 DIAGNOSIS — G4730 Sleep apnea, unspecified: Secondary | ICD-10-CM | POA: Insufficient documentation

## 2016-11-12 DIAGNOSIS — J449 Chronic obstructive pulmonary disease, unspecified: Secondary | ICD-10-CM | POA: Insufficient documentation

## 2016-11-12 DIAGNOSIS — M810 Age-related osteoporosis without current pathological fracture: Secondary | ICD-10-CM | POA: Insufficient documentation

## 2016-11-12 DIAGNOSIS — J45909 Unspecified asthma, uncomplicated: Secondary | ICD-10-CM | POA: Insufficient documentation

## 2016-11-12 DIAGNOSIS — Z5189 Encounter for other specified aftercare: Secondary | ICD-10-CM | POA: Insufficient documentation

## 2016-11-12 DIAGNOSIS — K219 Gastro-esophageal reflux disease without esophagitis: Secondary | ICD-10-CM | POA: Insufficient documentation

## 2016-11-12 NOTE — Telephone Encounter (Signed)
lmtcb for pt.  

## 2016-11-13 NOTE — Telephone Encounter (Signed)
Called and spoke to pt. Informed her of the results and recs per MR. Pt verbalized understanding and states she already has an ENT doctor. Advised her to make a f/u appt with her ENT. Pt verbalized understanding and denied any further questions or concerns at this time.

## 2016-11-14 ENCOUNTER — Telehealth: Payer: Self-pay | Admitting: Pulmonary Disease

## 2016-11-14 ENCOUNTER — Telehealth: Payer: Self-pay | Admitting: Internal Medicine

## 2016-11-14 ENCOUNTER — Encounter (HOSPITAL_COMMUNITY)
Admission: RE | Admit: 2016-11-14 | Discharge: 2016-11-14 | Disposition: A | Discharge status: Home / Self Care | Payer: Self-pay | Source: Ambulatory Visit | Attending: Pulmonary Disease | Admitting: Pulmonary Disease

## 2016-11-14 NOTE — Telephone Encounter (Signed)
[id number] x1 

## 2016-11-15 NOTE — Telephone Encounter (Signed)
#   Vials:1 Arrival Date:11/15/16 Lot [id number] Exp Date:8/21

## 2016-11-15 NOTE — Telephone Encounter (Signed)
#   vials:1 Ordered date:11/14/16 Shipping Date:11/15/16 For got to put it in.

## 2016-11-15 NOTE — Telephone Encounter (Signed)
Spoke with pt. This is regarding a previous conversation (see phone note from 12.22.17). Pt is to schedule visit with ENT. Nothing further needed at this time.

## 2016-11-19 ENCOUNTER — Encounter (HOSPITAL_COMMUNITY): Payer: Self-pay

## 2016-11-21 ENCOUNTER — Encounter (HOSPITAL_COMMUNITY)
Admission: RE | Admit: 2016-11-21 | Discharge: 2016-11-21 | Disposition: A | Discharge status: Home / Self Care | Payer: Self-pay | Source: Ambulatory Visit | Attending: Pulmonary Disease | Admitting: Pulmonary Disease

## 2016-11-25 ENCOUNTER — Ambulatory Visit: Payer: Medicare Other | Admitting: Internal Medicine

## 2016-11-26 ENCOUNTER — Encounter (HOSPITAL_COMMUNITY): Payer: Self-pay

## 2016-11-26 ENCOUNTER — Other Ambulatory Visit: Payer: Self-pay | Admitting: Internal Medicine

## 2016-11-26 NOTE — Telephone Encounter (Signed)
[name] [name], proceed with Cinqair for patient-believe she will have better benefit coverage at short stay. Thanks.

## 2016-11-28 ENCOUNTER — Encounter (HOSPITAL_COMMUNITY): Payer: Self-pay

## 2016-11-29 ENCOUNTER — Other Ambulatory Visit: Payer: Self-pay | Admitting: Internal Medicine

## 2016-11-29 NOTE — Telephone Encounter (Signed)
cinqair application form filled out to the best of my ability and given to [place].  Will keep message in Elise's box for follow-up.

## 2016-12-02 ENCOUNTER — Telehealth: Payer: Self-pay | Admitting: Internal Medicine

## 2016-12-02 MED ORDER — BUDESONIDE-FORMOTEROL FUMARATE 160-4.5 MCG/ACT IN AERO: 2.0000 | INHALATION_SPRAY | Freq: Two times a day (BID) | RESPIRATORY_TRACT | 0 refills | Status: DC

## 2016-12-02 NOTE — Telephone Encounter (Signed)
Spoke with pt, who states a Rx was just sent in for her by her PCP. Pt states she doesn't have the money to pick this RX up right now. Pt is requesting a sample to get her by. I have placed a sample up front for pick up. Pt aware and voiced her understanding. Nothing further needed.

## 2016-12-02 NOTE — Telephone Encounter (Signed)
Left detailed message for pt. Will need pt to sign Cinqair form, this has been left up front to be signed.

## 2016-12-03 ENCOUNTER — Ambulatory Visit (INDEPENDENT_AMBULATORY_CARE_PROVIDER_SITE_OTHER): Payer: Medicare Other

## 2016-12-03 ENCOUNTER — Encounter (HOSPITAL_COMMUNITY)
Admission: RE | Admit: 2016-12-03 | Discharge: 2016-12-03 | Disposition: A | Discharge status: Home / Self Care | Payer: Self-pay | Source: Ambulatory Visit | Attending: Pulmonary Disease | Admitting: Pulmonary Disease

## 2016-12-03 ENCOUNTER — Other Ambulatory Visit: Payer: Self-pay | Admitting: Internal Medicine

## 2016-12-03 DIAGNOSIS — J4520 Mild intermittent asthma, uncomplicated: Secondary | ICD-10-CM | POA: Diagnosis not present

## 2016-12-03 MED ORDER — OMALIZUMAB 150 MG ~~LOC~~ SOLR
150.0000 mg | SUBCUTANEOUS | Status: DC
  Administered 2016-12-03: 150 mg via SUBCUTANEOUS

## 2016-12-03 NOTE — Progress Notes (Signed)
Documentation and charges have been completed by Lindsay Lemons, CMA based on the Xolair documentation sheet completed by [name] Scott.  

## 2016-12-05 ENCOUNTER — Encounter (HOSPITAL_COMMUNITY)
Admission: RE | Admit: 2016-12-05 | Discharge: 2016-12-05 | Disposition: A | Discharge status: Home / Self Care | Payer: Self-pay | Source: Ambulatory Visit | Attending: Pulmonary Disease | Admitting: Pulmonary Disease

## 2016-12-05 ENCOUNTER — Telehealth (HOSPITAL_COMMUNITY): Payer: Self-pay | Admitting: Physician Assistant

## 2016-12-05 NOTE — Telephone Encounter (Signed)
Called pt to see if she would like to reschedule her myoview that was cancelled back on 11/06/16 and she voiced that she would but she did not want to receive the IV that is administered because it will counteract with a shot that she is already taken and she is also an asthmatic and does not want to induce an asthma attack. Please f/u with patient other recommendations.

## 2016-12-05 NOTE — Telephone Encounter (Signed)
This has been signed by pt, will need MR's signature prior to faxing. Will update chart once this has been faxed.

## 2016-12-05 NOTE — Telephone Encounter (Signed)
See message from [facility] about scheduling Myoview. I will route this message to [place] for further advice.

## 2016-12-06 NOTE — Telephone Encounter (Signed)
See message below from [place] about Myoview Recommendations. I will route to [facility] to schedule.

## 2016-12-06 NOTE — Telephone Encounter (Signed)
If she can do an exercise nuclear study, she will not receive Lexiscan. If her HR does not get to target, giving the Lexiscan during exercise will reduce the chances of side effects. The nuclear medicine staff has medication to counteract Lexiscan if symptoms are severe. Richardson Dopp, PA-C   12/06/2016 1:51 PM

## 2016-12-10 ENCOUNTER — Encounter (HOSPITAL_COMMUNITY)
Admission: RE | Admit: 2016-12-10 | Discharge: 2016-12-10 | Disposition: A | Discharge status: Home / Self Care | Payer: Self-pay | Source: Ambulatory Visit | Attending: Pulmonary Disease | Admitting: Pulmonary Disease

## 2016-12-10 NOTE — Telephone Encounter (Signed)
Cinqair start form has been signed by MR and faxed to [place]. Will await PA.

## 2016-12-11 ENCOUNTER — Telehealth: Payer: Self-pay | Admitting: Internal Medicine

## 2016-12-11 NOTE — Telephone Encounter (Signed)
[name] Dan  Do you mind calling this pt. She is confused as to why someone called her about Cinqair, she states she knows nothing about this and I do see messages where forms were filled out for this

## 2016-12-11 NOTE — Telephone Encounter (Signed)
#   vials:1 Ordered date:12/11/16 Shipping Date:12/11/16

## 2016-12-12 ENCOUNTER — Encounter (HOSPITAL_COMMUNITY): Payer: Self-pay

## 2016-12-12 DIAGNOSIS — Z5189 Encounter for other specified aftercare: Secondary | ICD-10-CM | POA: Insufficient documentation

## 2016-12-12 DIAGNOSIS — J45909 Unspecified asthma, uncomplicated: Secondary | ICD-10-CM | POA: Insufficient documentation

## 2016-12-12 DIAGNOSIS — G4730 Sleep apnea, unspecified: Secondary | ICD-10-CM | POA: Insufficient documentation

## 2016-12-12 DIAGNOSIS — J449 Chronic obstructive pulmonary disease, unspecified: Secondary | ICD-10-CM | POA: Insufficient documentation

## 2016-12-12 DIAGNOSIS — K219 Gastro-esophageal reflux disease without esophagitis: Secondary | ICD-10-CM | POA: Insufficient documentation

## 2016-12-12 DIAGNOSIS — M810 Age-related osteoporosis without current pathological fracture: Secondary | ICD-10-CM | POA: Insufficient documentation

## 2016-12-12 NOTE — Telephone Encounter (Signed)
Spoke with pt. She states that when she was here last, she filled out paperwork to be started on Nucala. MR wants her switch from Xolair to [place]. She received a call from a nurse for [place]. The pt is confused because she doesn't remember MR mentioning anything about this. Pt could not give me the name of the person who called or the company they were calling from.  Daneil Dan - can you please help with this? Thanks.

## 2016-12-12 NOTE — Telephone Encounter (Signed)
#   Vials:1 Arrival Date:12/12/16 Lot [id number] Exp Date:8/21

## 2016-12-12 NOTE — Telephone Encounter (Signed)
Received benefits summary. Will await PA.

## 2016-12-12 NOTE — Telephone Encounter (Signed)
Based on pt's insurance and s/s [name] [name] and MR thought [place] would the best route. Cinqair is the form she completed.

## 2016-12-13 ENCOUNTER — Encounter: Payer: Self-pay | Admitting: Internal Medicine

## 2016-12-13 ENCOUNTER — Ambulatory Visit (INDEPENDENT_AMBULATORY_CARE_PROVIDER_SITE_OTHER): Payer: Medicare Other | Admitting: Internal Medicine

## 2016-12-13 VITALS — BP 118/74 | HR 64 | Ht 63.0 in | Wt 170.0 lb

## 2016-12-13 DIAGNOSIS — J324 Chronic pansinusitis: Secondary | ICD-10-CM | POA: Diagnosis not present

## 2016-12-13 DIAGNOSIS — J0110 Acute frontal sinusitis, unspecified: Secondary | ICD-10-CM

## 2016-12-13 DIAGNOSIS — J4550 Severe persistent asthma, uncomplicated: Secondary | ICD-10-CM | POA: Diagnosis not present

## 2016-12-13 DIAGNOSIS — D721 Eosinophilia, unspecified: Secondary | ICD-10-CM

## 2016-12-13 DIAGNOSIS — J82 Pulmonary eosinophilia, not elsewhere classified: Secondary | ICD-10-CM

## 2016-12-13 DIAGNOSIS — J8283 Eosinophilic asthma: Secondary | ICD-10-CM

## 2016-12-13 DIAGNOSIS — R768 Other specified abnormal immunological findings in serum: Secondary | ICD-10-CM

## 2016-12-13 MED ORDER — PREDNISONE 10 MG PO TABS: 10.0000 mg | ORAL_TABLET | Freq: Every day | ORAL | 0 refills | Status: DC

## 2016-12-13 MED ORDER — AZITHROMYCIN 250 MG PO TABS: 250.0000 mg | ORAL_TABLET | Freq: Every day | ORAL | 0 refills | Status: DC
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
## 2016-12-13 NOTE — Patient Instructions (Addendum)
ICD-9-CM ICD-10-CM   1. Acute frontal sinusitis, recurrence not specified 461.1 J01.10   2. Severe persistent asthma without complication 123456 123XX123   3. 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 Elevated IgE level 795.79 R76.8     #asthma on chronic sinusitis with  exacerbation - recurrent  - - Take prednisone 40 mg daily x 2 days, then 20mg  daily x 2 days, then 10mg  daily x 2 days, then 5mg  daily x 2 days and stop - Z pak -refer ENT  #Severe to Moderate persistent asthma - stable disease 12/13/2016  - glad spiriva helping you  - will remove spiriva from allergy list - continue singulair, xolair, clariti, symbicort and spiriva respimat   - take spiriva samples or symbicort samples if we have them - respect your strong desire to start nucala (over cinqair or fasenra)  start paper work to [employer] - we discussed if nucala will be replacement or in addition to nucala  - hard to know which approach is superior or safer   - my bias is to continue xolair - you can hold off for a short while while getting nucala    Followup 3 months or sooner if needed

## 2016-12-13 NOTE — Addendum Note (Signed)
Addended by: Valerie Salts on: 12/13/2016 10:15 AM   Modules accepted: Orders

## 2016-12-16 NOTE — Telephone Encounter (Signed)
lmtcb X1 for pt to make aware of below recs.

## 2016-12-16 NOTE — Telephone Encounter (Signed)
Returning [name] [name]'s call, CB is [phone].

## 2016-12-17 ENCOUNTER — Encounter (HOSPITAL_COMMUNITY): Payer: Self-pay

## 2016-12-18 NOTE — Telephone Encounter (Signed)
Pt returned call. She states she does not want to do [place] but rather Nucala. Informed her of the forms will be up front to be signed. Will sign off on message and will start a new message once forms have been signed and Nucala process has been started.

## 2016-12-18 NOTE — Telephone Encounter (Signed)
Pt returned call. She states she does not want to do [place] but rather Nucala. Informed her of the forms will be up front to be signed. Will sign off on message and will start a new message once forms have been signed and Nucala process has been started.

## 2016-12-18 NOTE — Telephone Encounter (Signed)
lmtcb x1 for pt. 

## 2016-12-19 ENCOUNTER — Encounter (HOSPITAL_COMMUNITY): Payer: Self-pay

## 2016-12-19 ENCOUNTER — Ambulatory Visit: Payer: Medicare Other | Admitting: Internal Medicine

## 2016-12-23 ENCOUNTER — Telehealth: Payer: Self-pay | Admitting: Internal Medicine

## 2016-12-23 NOTE — Telephone Encounter (Signed)
Spoke with pt  She states returning Anita's call regarding ENT appt  She mentioned that the appt would not work for her and asks that [place] call her back  Will forward to [facility] pool

## 2016-12-24 ENCOUNTER — Encounter (HOSPITAL_COMMUNITY)
Admission: RE | Admit: 2016-12-24 | Discharge: 2016-12-24 | Disposition: A | Discharge status: Home / Self Care | Payer: Self-pay | Source: Ambulatory Visit | Attending: Pulmonary Disease | Admitting: Pulmonary Disease

## 2016-12-24 NOTE — Telephone Encounter (Signed)
I tried calling the patient back about rescheduling this ENT appt. I left her [name] [name] phone # [phone] for her to call and reschedule for an appointment better for her

## 2016-12-25 ENCOUNTER — Encounter: Payer: Self-pay | Admitting: Internal Medicine

## 2016-12-25 ENCOUNTER — Ambulatory Visit (INDEPENDENT_AMBULATORY_CARE_PROVIDER_SITE_OTHER): Payer: Medicare Other | Admitting: Internal Medicine

## 2016-12-25 ENCOUNTER — Other Ambulatory Visit (INDEPENDENT_AMBULATORY_CARE_PROVIDER_SITE_OTHER): Payer: Medicare Other

## 2016-12-25 VITALS — BP 138/80 | HR 60 | Temp 98.1°F | Ht 63.0 in | Wt 167.0 lb

## 2016-12-25 DIAGNOSIS — K219 Gastro-esophageal reflux disease without esophagitis: Secondary | ICD-10-CM | POA: Diagnosis not present

## 2016-12-25 DIAGNOSIS — I10 Essential (primary) hypertension: Secondary | ICD-10-CM | POA: Diagnosis not present

## 2016-12-25 DIAGNOSIS — E785 Hyperlipidemia, unspecified: Secondary | ICD-10-CM

## 2016-12-25 DIAGNOSIS — M25552 Pain in left hip: Secondary | ICD-10-CM | POA: Diagnosis not present

## 2016-12-25 DIAGNOSIS — R7301 Impaired fasting glucose: Secondary | ICD-10-CM

## 2016-12-25 DIAGNOSIS — E559 Vitamin D deficiency, unspecified: Secondary | ICD-10-CM

## 2016-12-25 DIAGNOSIS — M25561 Pain in right knee: Secondary | ICD-10-CM | POA: Insufficient documentation

## 2016-12-25 DIAGNOSIS — R52 Pain, unspecified: Secondary | ICD-10-CM

## 2016-12-25 LAB — COMPREHENSIVE METABOLIC PANEL
ALK PHOS: 99 U/L (ref 39–117)
ALT: 13 U/L (ref 0–35)
AST: 18 U/L (ref 0–37)
Albumin: 3.9 g/dL (ref 3.5–5.2)
BILIRUBIN TOTAL: 0.4 mg/dL (ref 0.2–1.2)
BUN: 18 mg/dL (ref 6–23)
CALCIUM: 9.5 mg/dL (ref 8.4–10.5)
CO2: 25 mEq/L (ref 19–32)
Chloride: 110 mEq/L (ref 96–112)
Creatinine, Ser: 1.14 mg/dL (ref 0.40–1.20)
GFR: 59.94 mL/min — AB (ref >60.00)
GLUCOSE: 99 mg/dL (ref 70–99)
POTASSIUM: 3.8 meq/L (ref 3.5–5.1)
Sodium: 141 mEq/L (ref 135–145)
Total Protein: 7.8 g/dL (ref 6.0–8.3)

## 2016-12-25 LAB — LIPID PANEL
Cholesterol: 214 mg/dL — ABNORMAL HIGH (ref 0–200)
HDL: 53.7 mg/dL (ref >39.00)
LDL Cholesterol: 139 mg/dL — ABNORMAL HIGH (ref 0–99)
NONHDL: 160.7
TRIGLYCERIDES: 109 mg/dL (ref 0.0–149.0)
Total CHOL/HDL Ratio: 4
VLDL: 21.8 mg/dL (ref 0.0–40.0)

## 2016-12-25 LAB — CBC
HCT: 38.1 % (ref 36.0–46.0)
HEMOGLOBIN: 12.3 g/dL (ref 12.0–15.0)
MCHC: 32.3 g/dL (ref 30.0–36.0)
MCV: 87.1 fl (ref 78.0–100.0)
PLATELETS: 213 10*3/uL (ref 150.0–400.0)
RBC: 4.38 Mil/uL (ref 3.87–5.11)
RDW: 13.3 % (ref 11.5–15.5)
WBC: 6.8 10*3/uL (ref 4.0–10.5)

## 2016-12-25 LAB — HEMOGLOBIN A1C: HEMOGLOBIN A1C: 5.9 % (ref 4.6–6.5)

## 2016-12-25 LAB — VITAMIN D 25 HYDROXY (VIT D DEFICIENCY, FRACTURES): VITD: 41.74 ng/mL (ref 30.00–100.00)

## 2016-12-25 NOTE — Assessment & Plan Note (Signed)
Referral to orthopedics for intervention is more appropriate than pain management as she has not tried and failed alternative management.

## 2016-12-25 NOTE — Assessment & Plan Note (Signed)
Checking vitamin D level today and if normal will stop weekly vitamin D and change to oral replacement as high dose is not meant for long term usage.

## 2016-12-25 NOTE — Assessment & Plan Note (Signed)
Taking nexium which will be refilled when needed. She is advised about dietary changes which can help with her symptoms.

## 2016-12-25 NOTE — Assessment & Plan Note (Signed)
Checking lipid panel and adjust as needed. Not taking any medications today.

## 2016-12-25 NOTE — Assessment & Plan Note (Signed)
She is advised that we need to try medication for her joint pains and not just refer to pain management as a first option of treatment.

## 2016-12-25 NOTE — Assessment & Plan Note (Signed)
BP at goal on amlodipine and checking CMP and adjust as needed.

## 2016-12-25 NOTE — Progress Notes (Signed)
   Subjective:    Patient ID: [name] [name], female    DOB: 08/11/1941, 76 y.o.   MRN: [id number]  HPI The patient is a 76 YO female coming in for some medication concerns. She has not been in for more than 1 year and has come in because her medications and incontinence supplies have stopped. She needs follow up on her vitamin D deficiency (she is still taking high dose replacement although no levels checked in some time, she denies problems from the medicine), and her cholesterol (she is not on medication due to her multiple allergies and resistance to medication, recently recommended a stress test but declined to get done), and her GERD (she is still taking nexium daily, having some breakthrough with certain foods and eating too late at night time, denies pain with eating, no vomiting, no dark stools). Also having new left lateral hip pain and right knee pain (she wants referral to pain management as she does not want any inconvenience in her life going forward, she has been dismissed from pain medication from our office, has refused to try anything for pain, has not seen anyone about this pain and has never had any injections in her knee or hip to try to help the pain).   Review of Systems  Constitutional: Positive for activity change and fatigue. Negative for appetite change, chills, fever and unexpected weight change.  HENT: Positive for congestion and rhinorrhea. Negative for ear discharge, ear pain, postnasal drip, sinus pain, sinus pressure, sore throat and trouble swallowing.   Eyes: Negative.   Respiratory: Positive for cough and shortness of breath. Negative for chest tightness and wheezing.   Cardiovascular: Negative for chest pain, palpitations and leg swelling.  Gastrointestinal: Positive for abdominal pain. Negative for abdominal distention, blood in stool, constipation, diarrhea, nausea and vomiting.  Musculoskeletal: Positive for arthralgias and myalgias. Negative for back pain, gait  problem, joint swelling, neck pain and neck stiffness.  Skin: Negative.   Neurological: Negative for dizziness, syncope, speech difficulty, weakness, light-headedness and headaches.  Psychiatric/Behavioral: Negative.       Objective:   Physical Exam  Constitutional: She is oriented to person, place, and time. She appears well-developed and well-nourished.  HENT:  Head: Normocephalic and atraumatic.  Eyes: EOM are normal.  Neck: Normal range of motion.  Cardiovascular: Normal rate and regular rhythm.   Pulmonary/Chest: Effort normal and breath sounds normal. No respiratory distress. She has no wheezes. She has no rales.  Lung exam stable from prior  Abdominal: Soft. Bowel sounds are normal. She exhibits no distension. There is no tenderness. There is no rebound.  Musculoskeletal: She exhibits tenderness. She exhibits no edema.  Pain on the lateral left thigh region, not in the groin. Right knee pain diffuse without joint effusion.   Neurological: She is alert and oriented to person, place, and time. Coordination normal.  Skin: Skin is warm and dry.  Psychiatric: She has a normal mood and affect.   Vitals:   12/25/16 0811  BP: 138/80  Pulse: 60  Temp: 98.1 F (36.7 C)  TempSrc: Oral  SpO2: 99%  Weight: 167 lb (75.8 kg)  Height: 5\' 3"  (1.6 m)      Assessment & Plan:

## 2016-12-25 NOTE — Progress Notes (Signed)
Pre visit review using our clinic review tool, if applicable. No additional management support is needed unless otherwise documented below in the visit note. 

## 2016-12-25 NOTE — Patient Instructions (Addendum)
We will send you to the orthopedic specialist to try to get rid of the pain in the knee and hip area.   We are checking the labs today and will call you back with the results.

## 2016-12-26 ENCOUNTER — Encounter (HOSPITAL_COMMUNITY)
Admission: RE | Admit: 2016-12-26 | Discharge: 2016-12-26 | Disposition: A | Discharge status: Home / Self Care | Payer: Self-pay | Source: Ambulatory Visit | Attending: Pulmonary Disease | Admitting: Pulmonary Disease

## 2016-12-26 ENCOUNTER — Other Ambulatory Visit: Payer: Self-pay | Admitting: Internal Medicine

## 2016-12-27 ENCOUNTER — Telehealth: Payer: Self-pay | Admitting: Internal Medicine

## 2016-12-27 NOTE — Telephone Encounter (Signed)
Spoke with rep at [facility] to inform them pt would not be continuing with order for Cinqar due to the pt changing medication to [place]. Nothing further is needed.

## 2016-12-30 DIAGNOSIS — M545 Low back pain: Secondary | ICD-10-CM | POA: Diagnosis not present

## 2016-12-30 DIAGNOSIS — M25552 Pain in left hip: Secondary | ICD-10-CM | POA: Diagnosis not present

## 2016-12-31 ENCOUNTER — Telehealth: Payer: Self-pay | Admitting: Internal Medicine

## 2016-12-31 ENCOUNTER — Other Ambulatory Visit: Payer: Self-pay | Admitting: Internal Medicine

## 2016-12-31 ENCOUNTER — Encounter (HOSPITAL_COMMUNITY)
Admission: RE | Admit: 2016-12-31 | Discharge: 2016-12-31 | Disposition: A | Discharge status: Home / Self Care | Payer: Self-pay | Source: Ambulatory Visit | Attending: Pulmonary Disease | Admitting: Pulmonary Disease

## 2016-12-31 NOTE — Telephone Encounter (Signed)
Called and spoke with pt and she is aware that we do not have any of the spiriva samples in the office today.  She will come by on 2/22 for her shot and will see if any are in by that time.

## 2017-01-01 ENCOUNTER — Telehealth: Payer: Self-pay | Admitting: Internal Medicine

## 2017-01-01 DIAGNOSIS — J4550 Severe persistent asthma, uncomplicated: Secondary | ICD-10-CM

## 2017-01-01 NOTE — Telephone Encounter (Signed)
Called and spoke with pt and she stated that she had called one weekend and spoke with the doc on call.  She stated that this doctor had faxed over rx for the nebulizer machine to [facility], but [facility] told her that they lost this rx.  This has been ordered again. Nothing further is needed.

## 2017-01-01 NOTE — Telephone Encounter (Signed)
Pt called in and said that she fell 2 days ago and said that she needs chair for the bath [facility]  She said she already had an appt with her ortho so that is why she did not come in to be seen.  She said that he checked her out

## 2017-01-02 ENCOUNTER — Telehealth: Payer: Self-pay | Admitting: Internal Medicine

## 2017-01-02 ENCOUNTER — Ambulatory Visit (INDEPENDENT_AMBULATORY_CARE_PROVIDER_SITE_OTHER): Payer: Medicare Other

## 2017-01-02 ENCOUNTER — Encounter (HOSPITAL_COMMUNITY): Payer: Self-pay

## 2017-01-02 DIAGNOSIS — J4550 Severe persistent asthma, uncomplicated: Secondary | ICD-10-CM

## 2017-01-02 MED ORDER — TIOTROPIUM BROMIDE MONOHYDRATE 2.5 MCG/ACT IN AERS: 2.0000 | INHALATION_SPRAY | Freq: Every day | RESPIRATORY_TRACT | 0 refills | Status: DC

## 2017-01-02 NOTE — Telephone Encounter (Signed)
Called and lmom to make the pt aware of samples that have been left up front. Nothing further is needed.

## 2017-01-03 ENCOUNTER — Ambulatory Visit: Payer: Medicare Other

## 2017-01-03 ENCOUNTER — Telehealth: Payer: Self-pay | Admitting: Internal Medicine

## 2017-01-03 DIAGNOSIS — M545 Low back pain: Secondary | ICD-10-CM | POA: Diagnosis not present

## 2017-01-03 NOTE — Telephone Encounter (Signed)
LVM with patient to call back with fax number

## 2017-01-03 NOTE — Telephone Encounter (Signed)
Called and spoke with [facility] and she is aware of OV note has been addendum.

## 2017-01-03 NOTE — Telephone Encounter (Signed)
Rx given to  you

## 2017-01-03 NOTE — Telephone Encounter (Signed)
Use albuterol nebs as needed - I put this in the note 12/13/16  Dr. Brand Males, M.D., [facility].C.P Pulmonary and Critical Care Medicine Staff Physician [place] [facility] and Critical [facility] Pager: [phone], If no answer or between  15:00h - 7:00h: call [phone]  01/03/2017 1:56 PM

## 2017-01-03 NOTE — Telephone Encounter (Signed)
Mandy returning call - she can be reached at [phone] -pr  °

## 2017-01-03 NOTE — Telephone Encounter (Signed)
lmomtcb x1 for mandy 

## 2017-01-03 NOTE — Telephone Encounter (Signed)
Called and spoke with Leafy Ro from [facility] and she stated that medicare will kick back the order for the nebulizer machine unless MR does an addendum to the 2/2 OV note.  MR please advise once this has been done. thanks

## 2017-01-06 ENCOUNTER — Telehealth: Payer: Self-pay | Admitting: Internal Medicine

## 2017-01-06 DIAGNOSIS — M545 Low back pain: Secondary | ICD-10-CM | POA: Diagnosis not present

## 2017-01-06 NOTE — Telephone Encounter (Signed)
#   vials:1 Ordered date:01/06/17 Shipping Date:01/06/17

## 2017-01-07 ENCOUNTER — Encounter (HOSPITAL_COMMUNITY)
Admission: RE | Admit: 2017-01-07 | Discharge: 2017-01-07 | Disposition: A | Discharge status: Home / Self Care | Payer: Self-pay | Source: Ambulatory Visit | Attending: Pulmonary Disease | Admitting: Pulmonary Disease

## 2017-01-07 ENCOUNTER — Telehealth: Payer: Self-pay | Admitting: Internal Medicine

## 2017-01-07 NOTE — Telephone Encounter (Signed)
#   Vials:1 Arrival Date:01/07/17 Lot [id number] Exp Date:9/21

## 2017-01-07 NOTE — Telephone Encounter (Signed)
LVM again to call back.

## 2017-01-07 NOTE — Telephone Encounter (Signed)
LMTCB

## 2017-01-08 MED ORDER — OMALIZUMAB 150 MG ~~LOC~~ SOLR
150.0000 mg | Freq: Once | SUBCUTANEOUS | Status: AC
  Administered 2017-01-02: 150 mg via SUBCUTANEOUS

## 2017-01-08 MED ORDER — UMECLIDINIUM BROMIDE 62.5 MCG/INH IN AEPB: 1.0000 | INHALATION_SPRAY | Freq: Every day | RESPIRATORY_TRACT | 0 refills | Status: DC

## 2017-01-08 NOTE — Telephone Encounter (Signed)
Pt called back, states her spiriva has been denied-per pt, the pharmacy sent a PA request that was never responded to.  Per pt she initiated an appeal yesterday for her spiriva.   Pt uses [facility] on [address], verified that Spiriva is not covered by pt insurance- preferred alternatives are Advair, breo, incruse  Per pt chart pt has tried dulera, breo, advair, and has been on symbicort and spiriva.    MR please advise if you'd like to switch pt to Incruse or if you'd like to to try and appeal insurance noncoverage.  Thanks.

## 2017-01-08 NOTE — Telephone Encounter (Signed)
Pt is aware of samples at front for pick up-she will contact our office for Rx or if appeal needs to be done. Nothing more needed at this time.

## 2017-01-08 NOTE — Telephone Encounter (Signed)
Please give her samples (2-3) of incruse and tell her to try it. If she likes it more than spiriva or as much then we can do incruse. If not then will do spl appeal for spiriva  Dr. Brand Males, M.D., [facility].C.P Pulmonary and Critical Care Medicine Staff Physician [employer] Pulmonary and Critical Care Pager: [phone], If no answer or between  15:00h - 7:00h: call [phone]  01/08/2017 4:18 PM

## 2017-01-09 ENCOUNTER — Telehealth: Payer: Self-pay | Admitting: Internal Medicine

## 2017-01-09 ENCOUNTER — Encounter (HOSPITAL_COMMUNITY): Payer: Medicare Other

## 2017-01-09 DIAGNOSIS — K219 Gastro-esophageal reflux disease without esophagitis: Secondary | ICD-10-CM | POA: Diagnosis not present

## 2017-01-09 DIAGNOSIS — G4730 Sleep apnea, unspecified: Secondary | ICD-10-CM | POA: Insufficient documentation

## 2017-01-09 DIAGNOSIS — Z5189 Encounter for other specified aftercare: Secondary | ICD-10-CM | POA: Insufficient documentation

## 2017-01-09 DIAGNOSIS — M810 Age-related osteoporosis without current pathological fracture: Secondary | ICD-10-CM | POA: Insufficient documentation

## 2017-01-09 DIAGNOSIS — J449 Chronic obstructive pulmonary disease, unspecified: Secondary | ICD-10-CM | POA: Insufficient documentation

## 2017-01-09 DIAGNOSIS — J45909 Unspecified asthma, uncomplicated: Secondary | ICD-10-CM | POA: Insufficient documentation

## 2017-01-09 NOTE — Telephone Encounter (Signed)
Pt has still not come by office to sign Nucala forms.  LMTCB for pt to remind her to come by office and sign Nucala forms.

## 2017-01-10 NOTE — Telephone Encounter (Signed)
Pt returning call and I told her a/b coming by to sign forms.[name] [name]

## 2017-01-14 ENCOUNTER — Encounter (HOSPITAL_COMMUNITY)
Admission: RE | Admit: 2017-01-14 | Discharge: 2017-01-14 | Disposition: A | Discharge status: Home / Self Care | Payer: Medicare Other | Source: Ambulatory Visit | Attending: Pulmonary Disease | Admitting: Pulmonary Disease

## 2017-01-15 DIAGNOSIS — M5416 Radiculopathy, lumbar region: Secondary | ICD-10-CM | POA: Diagnosis not present

## 2017-01-15 DIAGNOSIS — M545 Low back pain: Secondary | ICD-10-CM | POA: Diagnosis not present

## 2017-01-15 DIAGNOSIS — M7062 Trochanteric bursitis, left hip: Secondary | ICD-10-CM | POA: Diagnosis not present

## 2017-01-15 NOTE — Telephone Encounter (Signed)
[name] [name] had pt sign the forms when she came into the office for her shot. Nothing further is needed at this time.   Daneil Dan will you need to do anything else?

## 2017-01-16 ENCOUNTER — Encounter (HOSPITAL_COMMUNITY): Payer: Medicare Other

## 2017-01-16 NOTE — Telephone Encounter (Signed)
Spoke with [name] and was advise she will take over the pt's Nucala start.   Will forward to [name] to continue to follow.

## 2017-01-17 ENCOUNTER — Ambulatory Visit: Payer: Medicare Other | Attending: Physical Medicine and Rehabilitation | Admitting: Physical Therapy

## 2017-01-17 DIAGNOSIS — G8929 Other chronic pain: Secondary | ICD-10-CM | POA: Diagnosis not present

## 2017-01-17 DIAGNOSIS — R2689 Other abnormalities of gait and mobility: Secondary | ICD-10-CM | POA: Diagnosis not present

## 2017-01-17 DIAGNOSIS — M6281 Muscle weakness (generalized): Secondary | ICD-10-CM | POA: Insufficient documentation

## 2017-01-17 DIAGNOSIS — M545 Low back pain: Secondary | ICD-10-CM | POA: Diagnosis not present

## 2017-01-17 DIAGNOSIS — R293 Abnormal posture: Secondary | ICD-10-CM | POA: Diagnosis not present
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
## 2017-01-21 ENCOUNTER — Encounter (HOSPITAL_COMMUNITY): Payer: Medicare Other

## 2017-01-21 ENCOUNTER — Other Ambulatory Visit: Payer: Self-pay | Admitting: Internal Medicine

## 2017-01-22 NOTE — Telephone Encounter (Signed)
Called pt. Back, I called the # she gave me by Patty that nucala is covered and did not require p/a. She transferred me and this person said it will say that but nucala does need a p/a. I will call Medicare part D 01/23/17.

## 2017-01-22 NOTE — Telephone Encounter (Signed)
Tried to call Almina Schul ([place]) We don't have an account so we can't do it. Talked to [place] with [employer] and she gave me the # I need to verify benefits.

## 2017-01-22 NOTE — Telephone Encounter (Signed)
Patient returning [name] [name] call, CB is [phone]

## 2017-01-23 ENCOUNTER — Telehealth: Payer: Self-pay | Admitting: Internal Medicine

## 2017-01-23 ENCOUNTER — Encounter (HOSPITAL_COMMUNITY)
Admission: RE | Admit: 2017-01-23 | Discharge: 2017-01-23 | Disposition: A | Discharge status: Home / Self Care | Payer: Medicare Other | Source: Ambulatory Visit | Attending: Pulmonary Disease | Admitting: Pulmonary Disease

## 2017-01-23 NOTE — Telephone Encounter (Signed)
I went online and found a standard P/A form, they didn't have a nucala P/A form.I will fax it to Medicare Part D and hope it's the info their asking for and that we can finally get her medicine.

## 2017-01-23 NOTE — Telephone Encounter (Signed)
Pt. Returned my Call so I called her back. She hadn't gotten my message,so I told her I needed her Rx # and that ph.#. She said she didn't have a prescription card, said the medicaid # is the Rx #. Ok, I'll try it. She also gave me their web address.

## 2017-01-24 ENCOUNTER — Ambulatory Visit: Payer: Medicare Other | Admitting: Physical Therapy

## 2017-01-24 DIAGNOSIS — G8929 Other chronic pain: Secondary | ICD-10-CM | POA: Diagnosis not present

## 2017-01-24 DIAGNOSIS — M6281 Muscle weakness (generalized): Secondary | ICD-10-CM

## 2017-01-24 DIAGNOSIS — R293 Abnormal posture: Secondary | ICD-10-CM

## 2017-01-24 DIAGNOSIS — M545 Low back pain: Principal | ICD-10-CM

## 2017-01-24 DIAGNOSIS — R2689 Other abnormalities of gait and mobility: Secondary | ICD-10-CM
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
## 2017-01-24 NOTE — Telephone Encounter (Signed)
Closing this encounter b/c another has been created.

## 2017-01-27 ENCOUNTER — Telehealth: Payer: Self-pay | Admitting: Internal Medicine

## 2017-01-27 DIAGNOSIS — F339 Major depressive disorder, recurrent, unspecified: Secondary | ICD-10-CM

## 2017-01-27 DIAGNOSIS — N182 Chronic kidney disease, stage 2 (mild): Secondary | ICD-10-CM

## 2017-01-27 DIAGNOSIS — M25552 Pain in left hip: Secondary | ICD-10-CM

## 2017-01-27 DIAGNOSIS — R0600 Dyspnea, unspecified: Secondary | ICD-10-CM

## 2017-01-27 DIAGNOSIS — M25561 Pain in right knee: Secondary | ICD-10-CM

## 2017-01-27 DIAGNOSIS — E669 Obesity, unspecified: Secondary | ICD-10-CM

## 2017-01-27 NOTE — Telephone Encounter (Signed)
Pt states [name] [name] was suppose to call her in a shower chair from [place] but Pt hasn't received it.  Best # [phone]

## 2017-01-27 NOTE — Telephone Encounter (Signed)
In previous note I contacted patient multiple times to get number for advanced or fax because there are multiple sites, did not know which one she used, contacted patient for number and she is going to call back

## 2017-01-27 NOTE — Telephone Encounter (Signed)
Got number for advanced

## 2017-01-27 NOTE — Telephone Encounter (Signed)
Received fax from [place]. Appeal was filed for pt's Spiriva and has been approved until 11/10/2017. Pt aware. Nothing further needed at this time.

## 2017-01-28 ENCOUNTER — Encounter (HOSPITAL_COMMUNITY): Payer: Medicare Other

## 2017-01-29 ENCOUNTER — Ambulatory Visit: Payer: Medicare Other | Admitting: Physical Therapy

## 2017-01-29 NOTE — Telephone Encounter (Signed)
Have tried contacting advanced and sat on hold for at least 30 minuets. Will try again at different time

## 2017-01-30 ENCOUNTER — Encounter (HOSPITAL_COMMUNITY): Payer: Medicare Other

## 2017-01-31 ENCOUNTER — Encounter: Payer: Self-pay | Admitting: Physical Therapy

## 2017-01-31 ENCOUNTER — Ambulatory Visit (INDEPENDENT_AMBULATORY_CARE_PROVIDER_SITE_OTHER): Payer: Medicare Other

## 2017-01-31 ENCOUNTER — Ambulatory Visit: Payer: Medicare Other

## 2017-01-31 ENCOUNTER — Ambulatory Visit: Payer: Medicare Other | Admitting: Physical Therapy

## 2017-01-31 DIAGNOSIS — G8929 Other chronic pain: Secondary | ICD-10-CM

## 2017-01-31 DIAGNOSIS — R293 Abnormal posture: Secondary | ICD-10-CM

## 2017-01-31 DIAGNOSIS — J4550 Severe persistent asthma, uncomplicated: Secondary | ICD-10-CM | POA: Diagnosis not present

## 2017-01-31 DIAGNOSIS — M545 Low back pain: Principal | ICD-10-CM

## 2017-01-31 DIAGNOSIS — M6281 Muscle weakness (generalized): Secondary | ICD-10-CM | POA: Diagnosis not present

## 2017-01-31 DIAGNOSIS — R2689 Other abnormalities of gait and mobility: Secondary | ICD-10-CM

## 2017-01-31 NOTE — Telephone Encounter (Signed)
Pt. Came in today for her xolair shot, I told her I faxed [employer] a p/a Part [employer] form. When I hear from it I'll let you know. She had an appeal form or letter they sent her, she said she'd wok on that. Thanks! Hopefully between the 2 of [place] we'll get worked out.
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
## 2017-01-31 NOTE — Telephone Encounter (Signed)
I faxed a P/A form and faxed it to [employer] D, hopefully they'll approve it an d the pharm. Can ship he Nucala.

## 2017-01-31 NOTE — Patient Instructions (Signed)
TENS stands for Transcutaneous Electrical Nerve Stimulation. In other words, electrical impulses are allowed to pass through the skin in order to excite a nerve.   Purpose and Use of TENS:  TENS is a method used to manage acute and chronic pain without the use of drugs. It has been effective in managing pain associated with surgery, sprains, strains, trauma, rheumatoid arthritis, and neuralgias. It is a non-addictive, low risk, and non-invasive technique used to control pain. It is not, by any means, a curative form of treatment.   How TENS Works:  Most TENS units are a Paramedic unit powered by one 9 volt battery. Attached to the outside of the unit are two lead wires where two pins and/or snaps connect on each wire. All units come with a set of four reusable pads or electrodes. These are placed on the skin surrounding the area involved. By inserting the leads into  the pads, the electricity can pass from the unit making the circuit complete.  As the intensity is turned up slowly, the electrical current enters the body from the electrodes through the skin to the surrounding nerve fibers. This triggers the release of hormones from within the body. These hormones contain pain relievers. By increasing the circulation of these hormones, the person's pain may be lessened. It is also believed that the electrical stimulation itself helps to block the pain messages being sent to the brain, thus also decreasing the body's perception of pain.   Hazards:  TENS units are NOT to be used by patients with PACEMAKERS, DEFIBRILLATORS, DIABETIC PUMPS, PREGNANT WOMEN, and patients with SEIZURE DISORDERS.  TENS units are NOT to be used over the heart, throat, brain, or spinal cord.  One of the major side effects from the TENS unit may be skin irritation. Some people may develop a rash if they are sensitive to the materials used in the electrodes or the connecting wires.   Wear the unit for no more than 15-20 min  at a time.   Avoid overuse due the body getting used to the stem making it not as effective over time.

## 2017-02-03 ENCOUNTER — Encounter: Payer: Self-pay | Admitting: Physical Therapy

## 2017-02-03 ENCOUNTER — Telehealth: Payer: Self-pay

## 2017-02-03 ENCOUNTER — Ambulatory Visit: Payer: Medicare Other | Admitting: Physical Therapy

## 2017-02-03 DIAGNOSIS — G8929 Other chronic pain: Secondary | ICD-10-CM | POA: Diagnosis not present

## 2017-02-03 DIAGNOSIS — M6281 Muscle weakness (generalized): Secondary | ICD-10-CM

## 2017-02-03 DIAGNOSIS — R293 Abnormal posture: Secondary | ICD-10-CM

## 2017-02-03 DIAGNOSIS — M545 Low back pain: Principal | ICD-10-CM

## 2017-02-03 DIAGNOSIS — R2689 Other abnormalities of gait and mobility: Secondary | ICD-10-CM

## 2017-02-03 NOTE — Telephone Encounter (Signed)
Orders have been submitted.
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
## 2017-02-03 NOTE — Telephone Encounter (Signed)
Pt called in again about this and is upset about how long it is taking

## 2017-02-03 NOTE — Telephone Encounter (Signed)
LVM stating orders for shower chair have been submitted and I don't know how long it takes from there, to call back if she has any questions
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
## 2017-02-04 ENCOUNTER — Encounter (HOSPITAL_COMMUNITY)
Admission: RE | Admit: 2017-02-04 | Discharge: 2017-02-04 | Disposition: A | Discharge status: Home / Self Care | Payer: Medicare Other | Source: Ambulatory Visit | Attending: Pulmonary Disease | Admitting: Pulmonary Disease

## 2017-02-05 ENCOUNTER — Ambulatory Visit: Payer: Medicare Other | Admitting: Physical Therapy

## 2017-02-05 ENCOUNTER — Encounter: Payer: Self-pay | Admitting: Physical Therapy

## 2017-02-05 DIAGNOSIS — R293 Abnormal posture: Secondary | ICD-10-CM | POA: Diagnosis not present

## 2017-02-05 DIAGNOSIS — M6281 Muscle weakness (generalized): Secondary | ICD-10-CM | POA: Diagnosis not present

## 2017-02-05 DIAGNOSIS — R2689 Other abnormalities of gait and mobility: Secondary | ICD-10-CM

## 2017-02-05 DIAGNOSIS — M545 Low back pain: Principal | ICD-10-CM

## 2017-02-05 DIAGNOSIS — G8929 Other chronic pain: Secondary | ICD-10-CM
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
## 2017-02-06 ENCOUNTER — Encounter (HOSPITAL_COMMUNITY)
Admission: RE | Admit: 2017-02-06 | Discharge: 2017-02-06 | Disposition: A | Discharge status: Home / Self Care | Payer: Medicare Other | Source: Ambulatory Visit | Attending: Pulmonary Disease | Admitting: Pulmonary Disease

## 2017-02-06 MED ORDER — OMALIZUMAB 150 MG ~~LOC~~ SOLR
150.0000 mg | Freq: Once | SUBCUTANEOUS | Status: AC
  Administered 2017-01-31: 150 mg via SUBCUTANEOUS

## 2017-02-06 NOTE — Telephone Encounter (Signed)
[name], please advise on update. Thanks.  

## 2017-02-06 NOTE — Telephone Encounter (Signed)
I haven't heard anything from [facility] or [name]. We can close this encounter for now. I'll just create a new one when I hear from them and the pt.Marland Kitchen

## 2017-02-10 ENCOUNTER — Ambulatory Visit: Payer: Medicare Other | Attending: Physical Medicine and Rehabilitation | Admitting: Physical Therapy

## 2017-02-10 ENCOUNTER — Encounter: Payer: Self-pay | Admitting: Physical Therapy

## 2017-02-10 DIAGNOSIS — M545 Low back pain: Secondary | ICD-10-CM | POA: Insufficient documentation

## 2017-02-10 DIAGNOSIS — R2689 Other abnormalities of gait and mobility: Secondary | ICD-10-CM

## 2017-02-10 DIAGNOSIS — G8929 Other chronic pain: Secondary | ICD-10-CM | POA: Insufficient documentation

## 2017-02-10 DIAGNOSIS — M6281 Muscle weakness (generalized): Secondary | ICD-10-CM

## 2017-02-10 DIAGNOSIS — R293 Abnormal posture: Secondary | ICD-10-CM | POA: Insufficient documentation
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
## 2017-02-11 ENCOUNTER — Encounter (HOSPITAL_COMMUNITY): Payer: Medicare Other

## 2017-02-11 DIAGNOSIS — J45909 Unspecified asthma, uncomplicated: Secondary | ICD-10-CM | POA: Insufficient documentation

## 2017-02-11 DIAGNOSIS — G4730 Sleep apnea, unspecified: Secondary | ICD-10-CM | POA: Insufficient documentation

## 2017-02-11 DIAGNOSIS — M810 Age-related osteoporosis without current pathological fracture: Secondary | ICD-10-CM | POA: Insufficient documentation

## 2017-02-11 DIAGNOSIS — K219 Gastro-esophageal reflux disease without esophagitis: Secondary | ICD-10-CM | POA: Insufficient documentation

## 2017-02-11 DIAGNOSIS — J449 Chronic obstructive pulmonary disease, unspecified: Secondary | ICD-10-CM | POA: Insufficient documentation

## 2017-02-11 DIAGNOSIS — Z5189 Encounter for other specified aftercare: Secondary | ICD-10-CM | POA: Insufficient documentation

## 2017-02-12 ENCOUNTER — Telehealth: Payer: Self-pay | Admitting: Internal Medicine

## 2017-02-12 ENCOUNTER — Ambulatory Visit: Payer: Medicare Other | Admitting: Physical Therapy

## 2017-02-12 ENCOUNTER — Telehealth: Payer: Self-pay

## 2017-02-12 NOTE — Telephone Encounter (Signed)
Approval letter received for Norvasc and singulair, sent to scan

## 2017-02-12 NOTE — Telephone Encounter (Signed)
#   vials:1 Ordered date:02/12/17 Shipping Date:02/12/17

## 2017-02-13 ENCOUNTER — Encounter (HOSPITAL_COMMUNITY): Payer: Medicare Other

## 2017-02-13 NOTE — Telephone Encounter (Signed)
#   Vials:1 Arrival Date:02/12/17 Lot #:[id number] Exp Date:9/21

## 2017-02-14 ENCOUNTER — Ambulatory Visit: Payer: Medicare Other | Admitting: Physical Therapy

## 2017-02-14 DIAGNOSIS — M6281 Muscle weakness (generalized): Secondary | ICD-10-CM

## 2017-02-14 DIAGNOSIS — R293 Abnormal posture: Secondary | ICD-10-CM | POA: Diagnosis not present

## 2017-02-14 DIAGNOSIS — M545 Low back pain: Secondary | ICD-10-CM | POA: Diagnosis not present

## 2017-02-14 DIAGNOSIS — G8929 Other chronic pain: Secondary | ICD-10-CM

## 2017-02-14 DIAGNOSIS — R2689 Other abnormalities of gait and mobility: Secondary | ICD-10-CM
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
## 2017-02-18 ENCOUNTER — Encounter (HOSPITAL_COMMUNITY)
Admission: RE | Admit: 2017-02-18 | Discharge: 2017-02-18 | Disposition: A | Discharge status: Home / Self Care | Payer: Medicare Other | Source: Ambulatory Visit | Attending: Pulmonary Disease | Admitting: Pulmonary Disease

## 2017-02-18 ENCOUNTER — Other Ambulatory Visit: Payer: Self-pay | Admitting: Internal Medicine

## 2017-02-19 ENCOUNTER — Ambulatory Visit: Payer: Medicare Other | Admitting: Physical Therapy

## 2017-02-20 ENCOUNTER — Encounter (HOSPITAL_COMMUNITY)
Admission: RE | Admit: 2017-02-20 | Discharge: 2017-02-20 | Disposition: A | Discharge status: Home / Self Care | Payer: Medicare Other | Source: Ambulatory Visit | Attending: Pulmonary Disease | Admitting: Pulmonary Disease

## 2017-02-20 NOTE — Telephone Encounter (Signed)
Addendum: Samples for Incruse were placed up front to pick up 01/08/27. These were never picked up.  Samples have been logged back in and put back on the shelf. Nothing further needed.

## 2017-02-21 ENCOUNTER — Ambulatory Visit: Payer: Medicare Other | Admitting: Physical Therapy

## 2017-02-21 DIAGNOSIS — R293 Abnormal posture: Secondary | ICD-10-CM

## 2017-02-21 DIAGNOSIS — G8929 Other chronic pain: Secondary | ICD-10-CM | POA: Diagnosis not present

## 2017-02-21 DIAGNOSIS — M545 Low back pain: Secondary | ICD-10-CM | POA: Diagnosis not present

## 2017-02-21 DIAGNOSIS — M6281 Muscle weakness (generalized): Secondary | ICD-10-CM

## 2017-02-21 DIAGNOSIS — R2689 Other abnormalities of gait and mobility: Secondary | ICD-10-CM
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
## 2017-02-25 ENCOUNTER — Encounter (HOSPITAL_COMMUNITY)
Admission: RE | Admit: 2017-02-25 | Discharge: 2017-02-25 | Disposition: A | Discharge status: Home / Self Care | Payer: Medicare Other | Source: Ambulatory Visit | Attending: Pulmonary Disease | Admitting: Pulmonary Disease

## 2017-02-26 ENCOUNTER — Encounter: Payer: Self-pay | Admitting: Physical Therapy

## 2017-02-26 ENCOUNTER — Ambulatory Visit: Payer: Medicare Other | Admitting: Physical Therapy

## 2017-02-26 DIAGNOSIS — M6281 Muscle weakness (generalized): Secondary | ICD-10-CM | POA: Diagnosis not present

## 2017-02-26 DIAGNOSIS — R2689 Other abnormalities of gait and mobility: Secondary | ICD-10-CM | POA: Diagnosis not present

## 2017-02-26 DIAGNOSIS — R293 Abnormal posture: Secondary | ICD-10-CM | POA: Diagnosis not present

## 2017-02-26 DIAGNOSIS — G8929 Other chronic pain: Secondary | ICD-10-CM

## 2017-02-26 DIAGNOSIS — M545 Low back pain: Principal | ICD-10-CM
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
## 2017-02-27 ENCOUNTER — Encounter (HOSPITAL_COMMUNITY): Payer: Medicare Other

## 2017-02-28 ENCOUNTER — Ambulatory Visit: Payer: Medicare Other | Admitting: Physical Therapy

## 2017-02-28 ENCOUNTER — Ambulatory Visit: Payer: Medicare Other

## 2017-03-03 DIAGNOSIS — M545 Low back pain: Secondary | ICD-10-CM | POA: Diagnosis not present

## 2017-03-03 DIAGNOSIS — M7062 Trochanteric bursitis, left hip: Secondary | ICD-10-CM | POA: Diagnosis not present

## 2017-03-05 ENCOUNTER — Ambulatory Visit: Payer: Medicare Other | Admitting: Physical Therapy

## 2017-03-05 ENCOUNTER — Encounter: Payer: Self-pay | Admitting: Physical Therapy

## 2017-03-05 DIAGNOSIS — R293 Abnormal posture: Secondary | ICD-10-CM

## 2017-03-05 DIAGNOSIS — G8929 Other chronic pain: Secondary | ICD-10-CM

## 2017-03-05 DIAGNOSIS — M6281 Muscle weakness (generalized): Secondary | ICD-10-CM

## 2017-03-05 DIAGNOSIS — M545 Low back pain: Secondary | ICD-10-CM | POA: Diagnosis not present

## 2017-03-05 DIAGNOSIS — R2689 Other abnormalities of gait and mobility: Secondary | ICD-10-CM | POA: Diagnosis not present
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
## 2017-03-06 ENCOUNTER — Ambulatory Visit (INDEPENDENT_AMBULATORY_CARE_PROVIDER_SITE_OTHER): Payer: Medicare Other

## 2017-03-06 ENCOUNTER — Telehealth: Payer: Self-pay | Admitting: Internal Medicine

## 2017-03-06 ENCOUNTER — Ambulatory Visit: Payer: Medicare Other

## 2017-03-06 DIAGNOSIS — J4540 Moderate persistent asthma, uncomplicated: Secondary | ICD-10-CM

## 2017-03-06 MED ORDER — BUDESONIDE-FORMOTEROL FUMARATE 160-4.5 MCG/ACT IN AERO: 2.0000 | INHALATION_SPRAY | Freq: Two times a day (BID) | RESPIRATORY_TRACT | 0 refills | Status: DC

## 2017-03-06 NOTE — Telephone Encounter (Signed)
Pt is aware that we have 2 samples for her.  Duplicate message.  Will sign off since the pt picked up these samples.

## 2017-03-06 NOTE — Telephone Encounter (Signed)
Samples have been given to the pt.  Nothing further is needed.

## 2017-03-06 NOTE — Telephone Encounter (Signed)
Duplicate msg disregurard.[name] [name]

## 2017-03-07 ENCOUNTER — Ambulatory Visit: Payer: Medicare Other | Admitting: Physical Therapy

## 2017-03-07 DIAGNOSIS — G8929 Other chronic pain: Secondary | ICD-10-CM

## 2017-03-07 DIAGNOSIS — R2689 Other abnormalities of gait and mobility: Secondary | ICD-10-CM | POA: Diagnosis not present

## 2017-03-07 DIAGNOSIS — M6281 Muscle weakness (generalized): Secondary | ICD-10-CM | POA: Diagnosis not present

## 2017-03-07 DIAGNOSIS — M545 Low back pain: Secondary | ICD-10-CM | POA: Diagnosis not present

## 2017-03-07 DIAGNOSIS — R293 Abnormal posture: Secondary | ICD-10-CM

## 2017-03-07 MED ORDER — OMALIZUMAB 150 MG ~~LOC~~ SOLR
150.0000 mg | SUBCUTANEOUS | Status: DC
  Administered 2017-03-06: 150 mg via SUBCUTANEOUS
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
## 2017-03-07 NOTE — Progress Notes (Signed)
Xolair injection documentation and charges entered by Ashley Caulfield, RMA, based on injection sheet filled out by [name] Scott per office protocol.   

## 2017-03-11 ENCOUNTER — Telehealth: Payer: Self-pay | Admitting: Internal Medicine

## 2017-03-11 ENCOUNTER — Encounter (HOSPITAL_COMMUNITY)
Admission: RE | Admit: 2017-03-11 | Discharge: 2017-03-11 | Disposition: A | Discharge status: Home / Self Care | Payer: Medicare Other | Source: Ambulatory Visit | Attending: Pulmonary Disease | Admitting: Pulmonary Disease

## 2017-03-11 DIAGNOSIS — J449 Chronic obstructive pulmonary disease, unspecified: Secondary | ICD-10-CM | POA: Diagnosis not present

## 2017-03-11 DIAGNOSIS — J45909 Unspecified asthma, uncomplicated: Secondary | ICD-10-CM | POA: Diagnosis not present

## 2017-03-11 DIAGNOSIS — K219 Gastro-esophageal reflux disease without esophagitis: Secondary | ICD-10-CM | POA: Diagnosis not present

## 2017-03-11 DIAGNOSIS — M810 Age-related osteoporosis without current pathological fracture: Secondary | ICD-10-CM | POA: Insufficient documentation

## 2017-03-11 DIAGNOSIS — Z5189 Encounter for other specified aftercare: Secondary | ICD-10-CM | POA: Diagnosis present

## 2017-03-11 DIAGNOSIS — G4730 Sleep apnea, unspecified: Secondary | ICD-10-CM | POA: Diagnosis not present

## 2017-03-11 NOTE — Telephone Encounter (Signed)
#   vials:1 Ordered date:03/11/17 Shipping Date:03/11/17

## 2017-03-12 ENCOUNTER — Ambulatory Visit (INDEPENDENT_AMBULATORY_CARE_PROVIDER_SITE_OTHER): Payer: Medicare Other | Admitting: Internal Medicine

## 2017-03-12 ENCOUNTER — Encounter: Payer: Self-pay | Admitting: Internal Medicine

## 2017-03-12 VITALS — BP 138/78 | HR 50 | Ht 63.0 in | Wt 172.0 lb

## 2017-03-12 DIAGNOSIS — J4541 Moderate persistent asthma with (acute) exacerbation: Secondary | ICD-10-CM | POA: Diagnosis not present

## 2017-03-12 MED ORDER — AZITHROMYCIN 250 MG PO TABS: ORAL_TABLET | ORAL | 0 refills | Status: DC

## 2017-03-12 MED ORDER — PREDNISONE 10 MG PO TABS: ORAL_TABLET | ORAL | 1 refills | Status: DC

## 2017-03-12 NOTE — Patient Instructions (Addendum)
ICD-9-CM ICD-10-CM   1. Moderate persistent asthma with acute exacerbation 493.92 J45.41     #asthma on chronic sinusitis with  exacerbation - recurrent  - - Take prednisone 40 mg daily x 2 days, then 30mg  daily x 2 days,  then 20mg  daily x 2 days, then 10mg  daily x 2 days, then 5mg  daily and continue - continue low dose maintenance per your request - Z pak -  #Severe to Moderate persistent asthma - continue singulair, xolair, clariti, symbicort and spiriva respimat  -  respect your strong desire to start nucala (over cinqair or fasenra)  Elise to assess where we are with nucala  - hard to know which approach is superior or safer   - my bias is to continue xolair - you can hold off for a short while while getting nucala    Followup 3 months or sooner if needed

## 2017-03-12 NOTE — Addendum Note (Signed)
Addended by: Collier Salina on: 03/12/2017 12:50 PM   Modules accepted: Orders

## 2017-03-12 NOTE — Telephone Encounter (Signed)
#   Vials:1 Arrival Date:03/12/17 Lot #:[id number] Exp Date:9/21
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
## 2017-03-13 ENCOUNTER — Other Ambulatory Visit: Payer: Self-pay | Admitting: Internal Medicine

## 2017-03-14 ENCOUNTER — Ambulatory Visit: Payer: Medicare Other | Attending: Physical Medicine and Rehabilitation | Admitting: Physical Therapy

## 2017-03-14 ENCOUNTER — Encounter: Payer: Self-pay | Admitting: Physical Therapy

## 2017-03-14 DIAGNOSIS — M545 Low back pain: Secondary | ICD-10-CM | POA: Insufficient documentation

## 2017-03-14 DIAGNOSIS — G8929 Other chronic pain: Secondary | ICD-10-CM | POA: Insufficient documentation

## 2017-03-14 DIAGNOSIS — R293 Abnormal posture: Secondary | ICD-10-CM | POA: Diagnosis not present

## 2017-03-14 DIAGNOSIS — R2689 Other abnormalities of gait and mobility: Secondary | ICD-10-CM | POA: Insufficient documentation

## 2017-03-14 DIAGNOSIS — M6281 Muscle weakness (generalized): Secondary | ICD-10-CM | POA: Insufficient documentation
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
## 2017-03-19 ENCOUNTER — Other Ambulatory Visit: Payer: Self-pay | Admitting: Internal Medicine

## 2017-03-19 ENCOUNTER — Ambulatory Visit: Payer: Medicare Other | Admitting: Physical Therapy

## 2017-03-19 DIAGNOSIS — M6281 Muscle weakness (generalized): Secondary | ICD-10-CM

## 2017-03-19 DIAGNOSIS — R2689 Other abnormalities of gait and mobility: Secondary | ICD-10-CM

## 2017-03-19 DIAGNOSIS — R293 Abnormal posture: Secondary | ICD-10-CM | POA: Diagnosis not present

## 2017-03-19 DIAGNOSIS — M545 Low back pain: Secondary | ICD-10-CM | POA: Diagnosis not present

## 2017-03-19 DIAGNOSIS — G8929 Other chronic pain: Secondary | ICD-10-CM
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
## 2017-03-20 ENCOUNTER — Other Ambulatory Visit: Payer: Self-pay | Admitting: Internal Medicine

## 2017-03-20 ENCOUNTER — Encounter (HOSPITAL_COMMUNITY)
Admission: RE | Admit: 2017-03-20 | Discharge: 2017-03-20 | Disposition: A | Discharge status: Home / Self Care | Payer: Medicare Other | Source: Ambulatory Visit | Attending: Pulmonary Disease | Admitting: Pulmonary Disease

## 2017-03-21 ENCOUNTER — Telehealth: Payer: Self-pay | Admitting: Internal Medicine

## 2017-03-21 ENCOUNTER — Other Ambulatory Visit: Payer: Self-pay | Admitting: *Deleted

## 2017-03-21 MED ORDER — DESLORATADINE 5 MG PO TABS: 5.0000 mg | ORAL_TABLET | Freq: Every day | ORAL | 1 refills | Status: DC

## 2017-03-21 NOTE — Telephone Encounter (Signed)
Patient returned phone call..ert ° °

## 2017-03-21 NOTE — Telephone Encounter (Signed)
I called pt. And asked her tcb , it's concerning her nucala.

## 2017-03-21 NOTE — Telephone Encounter (Signed)
Will forward back to TS to follow up on phone call.

## 2017-03-24 NOTE — Telephone Encounter (Signed)
I left a more detailed message on pt.'s cell ph.. Unfortunately we're playing ph. Tag. Waiting for pt. tcb. Concerning nucala appeal.

## 2017-03-25 ENCOUNTER — Encounter (HOSPITAL_COMMUNITY)
Admission: RE | Admit: 2017-03-25 | Discharge: 2017-03-25 | Disposition: A | Discharge status: Home / Self Care | Payer: Medicare Other | Source: Ambulatory Visit | Attending: Pulmonary Disease | Admitting: Pulmonary Disease

## 2017-03-26 ENCOUNTER — Encounter: Payer: Self-pay | Admitting: Physical Therapy

## 2017-03-26 ENCOUNTER — Ambulatory Visit: Payer: Medicare Other | Admitting: Physical Therapy

## 2017-03-26 ENCOUNTER — Ambulatory Visit: Payer: Medicare Other

## 2017-03-26 DIAGNOSIS — M545 Low back pain: Secondary | ICD-10-CM | POA: Diagnosis not present

## 2017-03-26 DIAGNOSIS — R2689 Other abnormalities of gait and mobility: Secondary | ICD-10-CM | POA: Diagnosis not present

## 2017-03-26 DIAGNOSIS — R293 Abnormal posture: Secondary | ICD-10-CM

## 2017-03-26 DIAGNOSIS — G8929 Other chronic pain: Secondary | ICD-10-CM

## 2017-03-26 DIAGNOSIS — M6281 Muscle weakness (generalized): Secondary | ICD-10-CM
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
## 2017-03-26 NOTE — Telephone Encounter (Signed)
I was looking at today's schedule and noticed it's too soon for a xolair inj.. Called pt. To let her know and asked her to tcb and cancel and reschedule for 04/03/17. Still haven't heard from pt. Concerning nucala either.

## 2017-03-26 NOTE — Telephone Encounter (Signed)
Thanks, Wilburn Cornelia you putting every 28 days in appt. Comments should help. So when she schedules her next appt. It will be there. I asked her about her appeal for nucala, she said she filled it out and sent it in before the deadline. She still hasn't heard anything. She will let us know when she hears from [facility]).

## 2017-03-26 NOTE — Telephone Encounter (Signed)
Patient returned call, she states she got [name]'s message.  Stating she should be getting her injections every 28 days and for some reason she keeps being scheduled ever 2 weeks.  CB [phone].

## 2017-03-27 ENCOUNTER — Encounter (HOSPITAL_COMMUNITY)
Admission: RE | Admit: 2017-03-27 | Discharge: 2017-03-27 | Disposition: A | Discharge status: Home / Self Care | Payer: Medicare Other | Source: Ambulatory Visit | Attending: Pulmonary Disease | Admitting: Pulmonary Disease

## 2017-03-28 ENCOUNTER — Ambulatory Visit (INDEPENDENT_AMBULATORY_CARE_PROVIDER_SITE_OTHER): Payer: Medicare Other | Admitting: Internal Medicine

## 2017-03-28 ENCOUNTER — Encounter: Payer: Self-pay | Admitting: Internal Medicine

## 2017-03-28 DIAGNOSIS — K219 Gastro-esophageal reflux disease without esophagitis: Secondary | ICD-10-CM | POA: Diagnosis not present

## 2017-03-28 DIAGNOSIS — I10 Essential (primary) hypertension: Secondary | ICD-10-CM | POA: Diagnosis not present

## 2017-03-28 DIAGNOSIS — F419 Anxiety disorder, unspecified: Secondary | ICD-10-CM | POA: Diagnosis not present

## 2017-03-28 MED ORDER — VITAMIN D 50 MCG (2000 UT) PO CAPS: 2000.0000 [IU] | ORAL_CAPSULE | Freq: Every day | ORAL | 3 refills | Status: DC

## 2017-03-28 NOTE — Telephone Encounter (Signed)
Patient returned call [phone] home or cell [phone].

## 2017-03-28 NOTE — Patient Instructions (Addendum)
Anytime mid September or later you can make the wellness visit.   Call Dr. Halford Chessman to get back in with them.   We have sent in the vitamin D pills to the pharmacy to see if the insurance will cover them.

## 2017-03-30 NOTE — Assessment & Plan Note (Signed)
Taking xanax up to daily and will continue for now. She does not have depression symptoms and uses mostly for sleep. We talked about the option of safer options to try for sleep but she does not agree with change today due to good results and no side effects. She is aware that long term usage of xanax can cause increased risk of falls and long term memory problems.

## 2017-03-30 NOTE — Progress Notes (Signed)
   Subjective:    Patient ID: [name] [name], female    DOB: [birth year], 76 y.o.   MRN: [id number]  HPI The patient is a 76 YO female coming in for follow up of her medical problems including her blood pressure (has not taken meds today, takes amlodipine daily, denies headaches or chest pains or SOB, checks BP at home and typically in the 120s/80s), and her anxiety (she does take rare xanax and up to daily is acceptable, she denies the need for another agent, does use it mostly for sleep, denies SI/HI, denies depression type symptoms or guilt, she does not isolate herself but during allergy season leaves the house less due to her severe allergies), and her gerd (taking nexium daily, denies breakthrough GERD symptoms, if she misses a dose she does get symptoms more in the evenings no matter how early she eats her dinner meal).   Review of Systems  Constitutional: Positive for activity change. Negative for appetite change, fatigue, fever and unexpected weight change.  HENT: Negative.   Eyes: Negative.   Respiratory: Positive for cough and shortness of breath. Negative for chest tightness and wheezing.        Stable, chronic  Cardiovascular: Negative for chest pain, palpitations and leg swelling.  Gastrointestinal: Negative.   Musculoskeletal: Positive for arthralgias. Negative for back pain, gait problem, joint swelling and myalgias.  Skin: Negative.   Neurological: Negative.   Psychiatric/Behavioral: Negative.       Objective:   Physical Exam  Constitutional: She is oriented to person, place, and time. She appears well-developed and well-nourished.  HENT:  Head: Normocephalic and atraumatic.  Eyes: EOM are normal.  Neck: Normal range of motion.  Cardiovascular: Normal rate and regular rhythm.   Pulmonary/Chest: Effort normal and breath sounds normal. No respiratory distress.  Stable rhonchi, unchanged from prior  Abdominal: Soft. She exhibits no distension. There is no tenderness. There is  no rebound.  Musculoskeletal: She exhibits no edema.  Neurological: She is alert and oriented to person, place, and time. Coordination normal.  Skin: Skin is warm and dry.  Psychiatric: She has a normal mood and affect.   Vitals:   03/28/17 1003  BP: (!) 160/90  Pulse: 64  Resp: 14  Temp: 98.3 F (36.8 C)  TempSrc: Oral  SpO2: 98%  Weight: 173 lb (78.5 kg)  Height: 5\' 3"  (1.6 m)      Assessment & Plan:

## 2017-03-30 NOTE — Assessment & Plan Note (Signed)
Taking nexium daily and will continue as it is controlling her symptoms which are recurrent with stopping the medicine. Benefit of therapy outweighs risk at this time.

## 2017-03-30 NOTE — Assessment & Plan Note (Signed)
Encouraged her to think about dosage increase due to high BP off meds but she declines with good readings at home on meds. Asked her to take meds prior to next visit so we can get an accurate reading. Recent CMP without indication for change.

## 2017-04-01 NOTE — Telephone Encounter (Signed)
Talked to pt. Before I left last week. She was able to send in her appeal for nucala on time. Waiting to hear form the appeal. Will close note, b/c it will probably be 2 or 3 more weeks before we hear anything. Nothing further needed at this time.

## 2017-04-03 ENCOUNTER — Encounter (HOSPITAL_COMMUNITY)
Admission: RE | Admit: 2017-04-03 | Discharge: 2017-04-03 | Disposition: A | Discharge status: Home / Self Care | Payer: Medicare Other | Source: Ambulatory Visit | Attending: Pulmonary Disease | Admitting: Pulmonary Disease

## 2017-04-03 ENCOUNTER — Ambulatory Visit (INDEPENDENT_AMBULATORY_CARE_PROVIDER_SITE_OTHER): Payer: Medicare Other

## 2017-04-03 ENCOUNTER — Telehealth: Payer: Self-pay | Admitting: Internal Medicine

## 2017-04-03 ENCOUNTER — Ambulatory Visit: Payer: Medicare Other

## 2017-04-03 DIAGNOSIS — J4550 Severe persistent asthma, uncomplicated: Secondary | ICD-10-CM

## 2017-04-03 MED ORDER — BUDESONIDE-FORMOTEROL FUMARATE 160-4.5 MCG/ACT IN AERO: 2.0000 | INHALATION_SPRAY | Freq: Two times a day (BID) | RESPIRATORY_TRACT | 0 refills | Status: DC

## 2017-04-03 NOTE — Telephone Encounter (Signed)
[name] [name] asked about sample, as pt is in office for injection and has requested samples. Pt has been provide with one sample of symbicort 160. Nothing further needed.

## 2017-04-03 NOTE — Telephone Encounter (Signed)
lmtcb x1 for pt. 

## 2017-04-04 ENCOUNTER — Ambulatory Visit: Payer: Medicare Other | Admitting: Physical Therapy

## 2017-04-04 DIAGNOSIS — M6281 Muscle weakness (generalized): Secondary | ICD-10-CM | POA: Diagnosis not present

## 2017-04-04 DIAGNOSIS — M545 Low back pain: Principal | ICD-10-CM

## 2017-04-04 DIAGNOSIS — R293 Abnormal posture: Secondary | ICD-10-CM

## 2017-04-04 DIAGNOSIS — R2689 Other abnormalities of gait and mobility: Secondary | ICD-10-CM | POA: Diagnosis not present

## 2017-04-04 DIAGNOSIS — G8929 Other chronic pain: Secondary | ICD-10-CM | POA: Diagnosis not present

## 2017-04-04 MED ORDER — OMALIZUMAB 150 MG ~~LOC~~ SOLR
150.0000 mg | Freq: Once | SUBCUTANEOUS | Status: AC
  Administered 2017-04-03: 150 mg via SUBCUTANEOUS
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
## 2017-04-08 ENCOUNTER — Telehealth: Payer: Self-pay | Admitting: Internal Medicine

## 2017-04-08 NOTE — Telephone Encounter (Signed)
#   vials:1 Ordered date:04/08/17 Shipping Date:04/08/17

## 2017-04-09 ENCOUNTER — Ambulatory Visit: Payer: Medicare Other | Admitting: Physical Therapy

## 2017-04-09 NOTE — Telephone Encounter (Signed)
#   Vials:1 Arrival Date:04/09/17 Lot #:[id number] Exp Date:10/21

## 2017-04-10 ENCOUNTER — Encounter (HOSPITAL_COMMUNITY)
Admission: RE | Admit: 2017-04-10 | Discharge: 2017-04-10 | Disposition: A | Discharge status: Home / Self Care | Payer: Medicare Other | Source: Ambulatory Visit | Attending: Pulmonary Disease | Admitting: Pulmonary Disease

## 2017-04-15 ENCOUNTER — Encounter (HOSPITAL_COMMUNITY)
Admission: RE | Admit: 2017-04-15 | Discharge: 2017-04-15 | Disposition: A | Discharge status: Home / Self Care | Payer: Medicare Other | Source: Ambulatory Visit | Attending: Pulmonary Disease | Admitting: Pulmonary Disease

## 2017-04-15 DIAGNOSIS — M810 Age-related osteoporosis without current pathological fracture: Secondary | ICD-10-CM | POA: Diagnosis not present

## 2017-04-15 DIAGNOSIS — J45909 Unspecified asthma, uncomplicated: Secondary | ICD-10-CM | POA: Diagnosis not present

## 2017-04-15 DIAGNOSIS — K219 Gastro-esophageal reflux disease without esophagitis: Secondary | ICD-10-CM | POA: Insufficient documentation

## 2017-04-15 DIAGNOSIS — J449 Chronic obstructive pulmonary disease, unspecified: Secondary | ICD-10-CM | POA: Diagnosis not present

## 2017-04-15 DIAGNOSIS — Z5189 Encounter for other specified aftercare: Secondary | ICD-10-CM | POA: Diagnosis not present

## 2017-04-15 DIAGNOSIS — G4730 Sleep apnea, unspecified: Secondary | ICD-10-CM | POA: Insufficient documentation

## 2017-04-16 ENCOUNTER — Encounter: Payer: Self-pay | Admitting: Physical Therapy

## 2017-04-16 ENCOUNTER — Ambulatory Visit: Payer: Medicare Other | Attending: Physical Medicine and Rehabilitation | Admitting: Physical Therapy

## 2017-04-16 DIAGNOSIS — G8929 Other chronic pain: Secondary | ICD-10-CM | POA: Diagnosis not present

## 2017-04-16 DIAGNOSIS — R293 Abnormal posture: Secondary | ICD-10-CM | POA: Insufficient documentation

## 2017-04-16 DIAGNOSIS — R2689 Other abnormalities of gait and mobility: Secondary | ICD-10-CM

## 2017-04-16 DIAGNOSIS — M6281 Muscle weakness (generalized): Secondary | ICD-10-CM | POA: Insufficient documentation

## 2017-04-16 DIAGNOSIS — M545 Low back pain: Secondary | ICD-10-CM | POA: Insufficient documentation
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
## 2017-04-17 ENCOUNTER — Encounter (HOSPITAL_COMMUNITY)
Admission: RE | Admit: 2017-04-17 | Discharge: 2017-04-17 | Disposition: A | Discharge status: Home / Self Care | Payer: Medicare Other | Source: Ambulatory Visit | Attending: Pulmonary Disease | Admitting: Pulmonary Disease

## 2017-04-22 ENCOUNTER — Encounter (HOSPITAL_COMMUNITY)
Admission: RE | Admit: 2017-04-22 | Discharge: 2017-04-22 | Disposition: A | Discharge status: Home / Self Care | Payer: Medicare Other | Source: Ambulatory Visit | Attending: Pulmonary Disease | Admitting: Pulmonary Disease

## 2017-04-23 ENCOUNTER — Ambulatory Visit: Payer: Medicare Other | Admitting: Physical Therapy

## 2017-05-01 ENCOUNTER — Encounter (HOSPITAL_COMMUNITY)
Admission: RE | Admit: 2017-05-01 | Discharge: 2017-05-01 | Disposition: A | Discharge status: Home / Self Care | Payer: Medicare Other | Source: Ambulatory Visit | Attending: Pulmonary Disease | Admitting: Pulmonary Disease

## 2017-05-05 ENCOUNTER — Telehealth: Payer: Self-pay | Admitting: Internal Medicine

## 2017-05-05 ENCOUNTER — Ambulatory Visit (INDEPENDENT_AMBULATORY_CARE_PROVIDER_SITE_OTHER): Payer: Medicare Other

## 2017-05-05 DIAGNOSIS — J4520 Mild intermittent asthma, uncomplicated: Secondary | ICD-10-CM

## 2017-05-05 MED ORDER — BUDESONIDE-FORMOTEROL FUMARATE 160-4.5 MCG/ACT IN AERO: 2.0000 | INHALATION_SPRAY | Freq: Two times a day (BID) | RESPIRATORY_TRACT | 0 refills | Status: DC

## 2017-05-05 NOTE — Telephone Encounter (Signed)
Spoke with pt, who request samples of symbicort 160.  It appears that we have provided pt with samples for the past 2 months. Pt states this medication is not affordable for her. Pt states she applied for patient assistant through pulm rehab and will keep [place] updated. Pt has been provided with two samples of symbicort 160, as she is currently in office for xolair injection. Nothing further needed.

## 2017-05-06 MED ORDER — OMALIZUMAB 150 MG ~~LOC~~ SOLR
150.0000 mg | SUBCUTANEOUS | Status: DC
  Administered 2017-05-05: 150 mg via SUBCUTANEOUS

## 2017-05-06 NOTE — Progress Notes (Signed)
Documentation of medication administration and charges of Xolair have been completed by Lindsay Lemons, CMA based on the Xolair documentation sheet completed by [name] Scott.  

## 2017-05-08 ENCOUNTER — Encounter (HOSPITAL_COMMUNITY)
Admission: RE | Admit: 2017-05-08 | Discharge: 2017-05-08 | Disposition: A | Discharge status: Home / Self Care | Payer: Medicare Other | Source: Ambulatory Visit | Attending: Internal Medicine | Admitting: Internal Medicine

## 2017-05-12 ENCOUNTER — Other Ambulatory Visit: Payer: Self-pay | Admitting: Internal Medicine

## 2017-05-13 ENCOUNTER — Other Ambulatory Visit: Payer: Self-pay | Admitting: Internal Medicine

## 2017-05-15 ENCOUNTER — Encounter (HOSPITAL_COMMUNITY): Admission: RE | Admit: 2017-05-15 | Payer: Self-pay | Source: Ambulatory Visit

## 2017-05-16 ENCOUNTER — Other Ambulatory Visit: Payer: Self-pay | Admitting: Internal Medicine

## 2017-05-16 DIAGNOSIS — R921 Mammographic calcification found on diagnostic imaging of breast: Secondary | ICD-10-CM

## 2017-05-18 ENCOUNTER — Other Ambulatory Visit: Payer: Self-pay | Admitting: Internal Medicine

## 2017-05-20 ENCOUNTER — Encounter (HOSPITAL_COMMUNITY)
Admission: RE | Admit: 2017-05-20 | Discharge: 2017-05-20 | Disposition: A | Discharge status: Home / Self Care | Payer: Self-pay | Source: Ambulatory Visit | Attending: Pulmonary Disease | Admitting: Pulmonary Disease

## 2017-05-20 DIAGNOSIS — J449 Chronic obstructive pulmonary disease, unspecified: Secondary | ICD-10-CM | POA: Insufficient documentation

## 2017-05-20 DIAGNOSIS — J45909 Unspecified asthma, uncomplicated: Secondary | ICD-10-CM | POA: Insufficient documentation

## 2017-05-20 DIAGNOSIS — Z5189 Encounter for other specified aftercare: Secondary | ICD-10-CM | POA: Insufficient documentation

## 2017-05-20 DIAGNOSIS — M810 Age-related osteoporosis without current pathological fracture: Secondary | ICD-10-CM | POA: Insufficient documentation

## 2017-05-20 DIAGNOSIS — K219 Gastro-esophageal reflux disease without esophagitis: Secondary | ICD-10-CM | POA: Insufficient documentation

## 2017-05-20 DIAGNOSIS — G4730 Sleep apnea, unspecified: Secondary | ICD-10-CM | POA: Insufficient documentation

## 2017-05-21 ENCOUNTER — Telehealth: Payer: Self-pay | Admitting: Internal Medicine

## 2017-05-21 MED ORDER — PREDNISONE 10 MG PO TABS: ORAL_TABLET | ORAL | 0 refills | Status: DC

## 2017-05-21 NOTE — Telephone Encounter (Signed)
Pt aware of rec's per MR.  Prednisone sent to [facility] per patient request.  Patient given enough to last until upcoming appt with MR 06/17/17 Nothing further needed.

## 2017-05-21 NOTE — Telephone Encounter (Signed)
Received rx refill for pred taper. Called and spoke to pt. Pt states she has increase in SOB, wheezing, and dry cough - pt is contributing this to the weather.Pt denies CP/tightness, chest congestion and f/c/s.  Pt is requesting refill of pred taper. Pt is currently take pred 5mg  daily.   MR please advise. Thanks.

## 2017-05-21 NOTE — Telephone Encounter (Signed)
Take prednisone 40 mg daily x 2 days, then 20mg  daily x 2 days, then 10mg  daily x 2 days, then 5mg  daily to cotiniue  Dr. Brand Males, M.D., [facility].C.P Pulmonary and Critical Care Medicine Staff Physician [place] [facility] Pager: [phone], If no answer or between  15:00h - 7:00h: call [phone]  05/21/2017 2:37 PM

## 2017-05-22 ENCOUNTER — Encounter (HOSPITAL_COMMUNITY): Payer: Self-pay

## 2017-05-23 ENCOUNTER — Telehealth: Payer: Self-pay | Admitting: Internal Medicine

## 2017-05-23 ENCOUNTER — Other Ambulatory Visit: Payer: Self-pay | Admitting: Internal Medicine

## 2017-05-23 ENCOUNTER — Other Ambulatory Visit: Payer: Self-pay | Admitting: Family

## 2017-05-23 DIAGNOSIS — R921 Mammographic calcification found on diagnostic imaging of breast: Secondary | ICD-10-CM

## 2017-05-23 NOTE — Telephone Encounter (Signed)
#   Vials:1 Arrival Date:05/23/17 Lot #:[id number] Exp Date:11/21

## 2017-05-23 NOTE — Telephone Encounter (Signed)
Created in error

## 2017-05-26 ENCOUNTER — Ambulatory Visit
Admission: RE | Admit: 2017-05-26 | Discharge: 2017-05-26 | Disposition: A | Discharge status: Home / Self Care | Payer: Medicare Other | Source: Ambulatory Visit | Attending: Internal Medicine | Admitting: Internal Medicine

## 2017-05-26 DIAGNOSIS — R921 Mammographic calcification found on diagnostic imaging of breast: Secondary | ICD-10-CM

## 2017-05-27 ENCOUNTER — Encounter (HOSPITAL_COMMUNITY)
Admission: RE | Admit: 2017-05-27 | Discharge: 2017-05-27 | Disposition: A | Discharge status: Home / Self Care | Payer: Self-pay | Source: Ambulatory Visit | Attending: Pulmonary Disease | Admitting: Pulmonary Disease

## 2017-05-29 ENCOUNTER — Encounter (HOSPITAL_COMMUNITY)
Admission: RE | Admit: 2017-05-29 | Discharge: 2017-05-29 | Disposition: A | Discharge status: Home / Self Care | Payer: Self-pay | Source: Ambulatory Visit | Attending: Pulmonary Disease | Admitting: Pulmonary Disease

## 2017-06-03 ENCOUNTER — Encounter (HOSPITAL_COMMUNITY)
Admission: RE | Admit: 2017-06-03 | Discharge: 2017-06-03 | Disposition: A | Discharge status: Home / Self Care | Payer: Self-pay | Source: Ambulatory Visit | Attending: Pulmonary Disease | Admitting: Pulmonary Disease

## 2017-06-04 ENCOUNTER — Ambulatory Visit (INDEPENDENT_AMBULATORY_CARE_PROVIDER_SITE_OTHER): Payer: Medicare Other

## 2017-06-04 DIAGNOSIS — J4520 Mild intermittent asthma, uncomplicated: Secondary | ICD-10-CM

## 2017-06-05 ENCOUNTER — Telehealth: Payer: Self-pay | Admitting: Internal Medicine

## 2017-06-05 ENCOUNTER — Encounter (HOSPITAL_COMMUNITY): Payer: Self-pay

## 2017-06-05 MED ORDER — OMALIZUMAB 150 MG ~~LOC~~ SOLR
150.0000 mg | SUBCUTANEOUS | Status: DC
  Administered 2017-06-04: 150 mg via SUBCUTANEOUS

## 2017-06-05 NOTE — Progress Notes (Signed)
Documentation of medication administration and charges of Xolair have been completed by Desmond Dike, CMA based on the [place] documentation sheet completed by Clayborne Dana, CMA.

## 2017-06-05 NOTE — Telephone Encounter (Signed)
#   vials:1 Ordered date:06/05/17 Shipping Date:06/05/17

## 2017-06-09 NOTE — Telephone Encounter (Signed)
#   Vials:1 Arrival Date:06/09/17 Lot #:[id number] Exp Date:10/2020

## 2017-06-10 ENCOUNTER — Encounter (HOSPITAL_COMMUNITY): Payer: Self-pay

## 2017-06-12 ENCOUNTER — Encounter (HOSPITAL_COMMUNITY)
Admission: RE | Admit: 2017-06-12 | Discharge: 2017-06-12 | Disposition: A | Discharge status: Home / Self Care | Payer: Self-pay | Source: Ambulatory Visit | Attending: Pulmonary Disease | Admitting: Pulmonary Disease

## 2017-06-12 DIAGNOSIS — M810 Age-related osteoporosis without current pathological fracture: Secondary | ICD-10-CM | POA: Insufficient documentation

## 2017-06-12 DIAGNOSIS — J449 Chronic obstructive pulmonary disease, unspecified: Secondary | ICD-10-CM | POA: Insufficient documentation

## 2017-06-12 DIAGNOSIS — J45909 Unspecified asthma, uncomplicated: Secondary | ICD-10-CM | POA: Insufficient documentation

## 2017-06-12 DIAGNOSIS — Z5189 Encounter for other specified aftercare: Secondary | ICD-10-CM | POA: Insufficient documentation

## 2017-06-12 DIAGNOSIS — K219 Gastro-esophageal reflux disease without esophagitis: Secondary | ICD-10-CM | POA: Insufficient documentation

## 2017-06-12 DIAGNOSIS — G4730 Sleep apnea, unspecified: Secondary | ICD-10-CM | POA: Insufficient documentation

## 2017-06-15 ENCOUNTER — Other Ambulatory Visit: Payer: Self-pay | Admitting: Internal Medicine

## 2017-06-16 ENCOUNTER — Other Ambulatory Visit: Payer: Self-pay | Admitting: Internal Medicine

## 2017-06-17 ENCOUNTER — Encounter: Payer: Self-pay | Admitting: Internal Medicine

## 2017-06-17 ENCOUNTER — Encounter (HOSPITAL_COMMUNITY): Payer: Self-pay

## 2017-06-17 ENCOUNTER — Ambulatory Visit (INDEPENDENT_AMBULATORY_CARE_PROVIDER_SITE_OTHER): Payer: Medicare Other | Admitting: Internal Medicine

## 2017-06-17 VITALS — BP 138/80 | HR 68 | Ht 63.0 in | Wt 176.0 lb

## 2017-06-17 DIAGNOSIS — J4550 Severe persistent asthma, uncomplicated: Secondary | ICD-10-CM

## 2017-06-17 NOTE — Telephone Encounter (Signed)
Routing to greg calone---i'm not seeing office visit addressing need for this med, are you ok with refill, please advise in the absence of dr Sharlet Salina, thanks

## 2017-06-17 NOTE — Patient Instructions (Addendum)
ICD-10-CM   1. Severe persistent asthma without complication A74.25      #Severe to Moderate persistent asthma - currently without flareup - continue singulair, xolair, clariti, symbicort and spiriva respimat  -  We are holding off other biologicals like nucala due to personal preference and insurance issues - flu shot in fall   Followup 3 months or sooner if needed
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
## 2017-06-19 ENCOUNTER — Encounter (HOSPITAL_COMMUNITY): Payer: Self-pay

## 2017-06-19 NOTE — Telephone Encounter (Signed)
Medication refilled

## 2017-06-24 ENCOUNTER — Encounter (HOSPITAL_COMMUNITY)
Admission: RE | Admit: 2017-06-24 | Discharge: 2017-06-24 | Disposition: A | Discharge status: Home / Self Care | Payer: Self-pay | Source: Ambulatory Visit | Attending: Pulmonary Disease | Admitting: Pulmonary Disease

## 2017-06-24 ENCOUNTER — Other Ambulatory Visit: Payer: Self-pay | Admitting: Internal Medicine

## 2017-06-26 ENCOUNTER — Encounter (HOSPITAL_COMMUNITY)
Admission: RE | Admit: 2017-06-26 | Discharge: 2017-06-26 | Disposition: A | Discharge status: Home / Self Care | Payer: Self-pay | Source: Ambulatory Visit | Attending: Pulmonary Disease | Admitting: Pulmonary Disease

## 2017-07-01 ENCOUNTER — Encounter (HOSPITAL_COMMUNITY)
Admission: RE | Admit: 2017-07-01 | Discharge: 2017-07-01 | Disposition: A | Discharge status: Home / Self Care | Payer: Self-pay | Source: Ambulatory Visit | Attending: Pulmonary Disease | Admitting: Pulmonary Disease

## 2017-07-02 ENCOUNTER — Ambulatory Visit: Payer: Medicare Other

## 2017-07-03 ENCOUNTER — Encounter (HOSPITAL_COMMUNITY)
Admission: RE | Admit: 2017-07-03 | Discharge: 2017-07-03 | Disposition: A | Discharge status: Home / Self Care | Payer: Self-pay | Source: Ambulatory Visit | Attending: Pulmonary Disease | Admitting: Pulmonary Disease

## 2017-07-03 ENCOUNTER — Ambulatory Visit: Payer: Medicare Other

## 2017-07-04 ENCOUNTER — Ambulatory Visit: Payer: Medicare Other

## 2017-07-08 ENCOUNTER — Encounter (HOSPITAL_COMMUNITY)
Admission: RE | Admit: 2017-07-08 | Discharge: 2017-07-08 | Disposition: A | Discharge status: Home / Self Care | Payer: Self-pay | Source: Ambulatory Visit | Attending: Pulmonary Disease | Admitting: Pulmonary Disease

## 2017-07-10 ENCOUNTER — Encounter (HOSPITAL_COMMUNITY): Payer: Self-pay

## 2017-07-15 ENCOUNTER — Other Ambulatory Visit: Payer: Self-pay | Admitting: Internal Medicine

## 2017-07-15 ENCOUNTER — Encounter (HOSPITAL_COMMUNITY)
Admission: RE | Admit: 2017-07-15 | Discharge: 2017-07-15 | Disposition: A | Discharge status: Home / Self Care | Payer: Self-pay | Source: Ambulatory Visit | Attending: Pulmonary Disease | Admitting: Pulmonary Disease

## 2017-07-15 DIAGNOSIS — J45909 Unspecified asthma, uncomplicated: Secondary | ICD-10-CM | POA: Insufficient documentation

## 2017-07-15 DIAGNOSIS — K219 Gastro-esophageal reflux disease without esophagitis: Secondary | ICD-10-CM | POA: Insufficient documentation

## 2017-07-15 DIAGNOSIS — Z5189 Encounter for other specified aftercare: Secondary | ICD-10-CM | POA: Insufficient documentation

## 2017-07-15 DIAGNOSIS — G4730 Sleep apnea, unspecified: Secondary | ICD-10-CM | POA: Insufficient documentation

## 2017-07-15 DIAGNOSIS — J449 Chronic obstructive pulmonary disease, unspecified: Secondary | ICD-10-CM | POA: Insufficient documentation

## 2017-07-15 DIAGNOSIS — M810 Age-related osteoporosis without current pathological fracture: Secondary | ICD-10-CM | POA: Insufficient documentation

## 2017-07-17 ENCOUNTER — Encounter (HOSPITAL_COMMUNITY)
Admission: RE | Admit: 2017-07-17 | Discharge: 2017-07-17 | Disposition: A | Discharge status: Home / Self Care | Payer: Self-pay | Source: Ambulatory Visit | Attending: Pulmonary Disease | Admitting: Pulmonary Disease

## 2017-07-21 ENCOUNTER — Telehealth: Payer: Self-pay | Admitting: Internal Medicine

## 2017-07-21 NOTE — Telephone Encounter (Signed)
#   vials:1 Ordered date:07/21/17 Shipping Date:07/21/17

## 2017-07-22 ENCOUNTER — Encounter (HOSPITAL_COMMUNITY): Admission: RE | Admit: 2017-07-22 | Payer: Self-pay | Source: Ambulatory Visit

## 2017-07-22 ENCOUNTER — Other Ambulatory Visit: Payer: Self-pay | Admitting: Internal Medicine

## 2017-07-22 NOTE — Telephone Encounter (Signed)
#   Vials:1 Arrival Date:07/22/17 Lot #:[id number] [id number]

## 2017-07-24 ENCOUNTER — Telehealth: Payer: Self-pay | Admitting: Internal Medicine

## 2017-07-24 ENCOUNTER — Encounter (HOSPITAL_COMMUNITY)
Admission: RE | Admit: 2017-07-24 | Discharge: 2017-07-24 | Disposition: A | Discharge status: Home / Self Care | Payer: Self-pay | Source: Ambulatory Visit | Attending: Pulmonary Disease | Admitting: Pulmonary Disease

## 2017-07-24 NOTE — Telephone Encounter (Signed)
Spoke with Kathlee Nations about Mrs. Hayter's nucala. I called pt. And told her what Kathlee Nations explained to me: She needs to call her case worker and update Medicaid. She needs to tell them that [facility] hospital only. I asked her to call Kathlee Nations next wk. If she has any questions. Kathlee Nations had left for the day so I left her a Advertising account executive. Nothing further needed at this time.

## 2017-07-24 NOTE — Telephone Encounter (Signed)
[facility] [address]

## 2017-07-24 NOTE — Telephone Encounter (Signed)
Pt. Called me back before the phones were cut off. When I called previously she told me [facility] was hosp. only (in a earlier ph. Note.).  Mrs. Goyne told me today it's rx only. She already has Medicare and Medicaid, [place] [employer] will not approve nucala. She said MR can send a letter. She going to call and make an appt. With him next wk.Marland Kitchen

## 2017-07-29 ENCOUNTER — Encounter (HOSPITAL_COMMUNITY)
Admission: RE | Admit: 2017-07-29 | Discharge: 2017-07-29 | Disposition: A | Discharge status: Home / Self Care | Payer: Self-pay | Source: Ambulatory Visit | Attending: Pulmonary Disease | Admitting: Pulmonary Disease

## 2017-07-30 ENCOUNTER — Telehealth: Payer: Self-pay | Admitting: Internal Medicine

## 2017-07-30 NOTE — Telephone Encounter (Signed)
States that patient has requested an expedited appeal for PA approval on nexium.  States does not have long to get approval.  Is requesting statement that patient has tried other medication and this medication works best for patient.

## 2017-07-30 NOTE — Telephone Encounter (Signed)
Contacted Rob with information stating patient has been on this medication since 2011 and has tried pantoprazole in the past without desired effects. Rob stated he is getting the PA sent through for her and to disregard the fax he sent.

## 2017-07-31 ENCOUNTER — Encounter (HOSPITAL_COMMUNITY)
Admission: RE | Admit: 2017-07-31 | Discharge: 2017-07-31 | Disposition: A | Discharge status: Home / Self Care | Payer: Self-pay | Source: Ambulatory Visit | Attending: Pulmonary Disease | Admitting: Pulmonary Disease

## 2017-08-01 ENCOUNTER — Ambulatory Visit: Payer: Medicare Other

## 2017-08-04 ENCOUNTER — Ambulatory Visit (INDEPENDENT_AMBULATORY_CARE_PROVIDER_SITE_OTHER): Payer: Medicare Other

## 2017-08-04 DIAGNOSIS — J4540 Moderate persistent asthma, uncomplicated: Secondary | ICD-10-CM

## 2017-08-05 ENCOUNTER — Encounter (HOSPITAL_COMMUNITY)
Admission: RE | Admit: 2017-08-05 | Discharge: 2017-08-05 | Disposition: A | Discharge status: Home / Self Care | Payer: Self-pay | Source: Ambulatory Visit | Attending: Pulmonary Disease | Admitting: Pulmonary Disease

## 2017-08-05 NOTE — Progress Notes (Signed)
Pre visit review using our clinic review tool, if applicable. No additional management support is needed unless otherwise documented below in the visit note. 
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
## 2017-08-06 ENCOUNTER — Ambulatory Visit (INDEPENDENT_AMBULATORY_CARE_PROVIDER_SITE_OTHER): Payer: Medicare Other | Admitting: *Deleted

## 2017-08-06 VITALS — BP 144/82 | HR 50 | Resp 20 | Ht 63.0 in | Wt 175.0 lb

## 2017-08-06 DIAGNOSIS — Z0000 Encounter for general adult medical examination without abnormal findings: Secondary | ICD-10-CM

## 2017-08-06 MED ORDER — OMALIZUMAB 150 MG ~~LOC~~ SOLR
150.0000 mg | SUBCUTANEOUS | Status: DC
  Administered 2017-08-04: 150 mg via SUBCUTANEOUS

## 2017-08-06 NOTE — Progress Notes (Signed)
Documentation of medication administration and charges of Xolair have been completed by Lindsay Lemons, CMA based on the Xolair documentation sheet completed by [name] Scott.  

## 2017-08-06 NOTE — Patient Instructions (Addendum)
Continue doing brain stimulating activities (puzzles, reading, adult coloring books, staying active) to keep memory sharp.   Continue to eat heart healthy diet (full of fruits, vegetables, whole grains, lean protein, water--limit salt, fat, and sugar intake) and increase physical activity as tolerated.   [name] [name] , Thank you for taking time to come for your Medicare Wellness Visit. I appreciate your ongoing commitment to your health goals. Please review the following plan we discussed and let me know if I can assist you in the future.   These are the goals we discussed: Goals    . patient          Wants to be comfortable and get around; Will take cell phone to the bathroom or phone with you in case you fall     . Stay as active and as independent as possible          Continue to exercise, eat healthy, enjoy life, family and worship.       This is a list of the screening recommended for you and due dates:  Health Maintenance  Topic Date Due  . Tetanus Vaccine  01/10/2015  . Mammogram  05/27/2019  . Colon Cancer Screening  03/29/2024  . DEXA scan (bone density measurement)  Completed  . Pneumonia vaccines  Completed

## 2017-08-07 ENCOUNTER — Encounter (HOSPITAL_COMMUNITY)
Admission: RE | Admit: 2017-08-07 | Discharge: 2017-08-07 | Disposition: A | Discharge status: Home / Self Care | Payer: Medicare Other | Source: Ambulatory Visit | Attending: Pulmonary Disease | Admitting: Pulmonary Disease

## 2017-08-08 NOTE — Progress Notes (Signed)
Patient ID: [name] [name], female   DOB: [birth year], 75 y.o.   MRN: [id number] Medical screening examination/treatment/procedure(s) were performed by non-physician practitioner and as supervising physician I was immediately available for consultation/collaboration. I agree with above. Hoyt Koch, MD

## 2017-08-12 ENCOUNTER — Encounter (HOSPITAL_COMMUNITY): Payer: Self-pay

## 2017-08-12 DIAGNOSIS — K219 Gastro-esophageal reflux disease without esophagitis: Secondary | ICD-10-CM | POA: Insufficient documentation

## 2017-08-12 DIAGNOSIS — J45909 Unspecified asthma, uncomplicated: Secondary | ICD-10-CM | POA: Insufficient documentation

## 2017-08-12 DIAGNOSIS — G4730 Sleep apnea, unspecified: Secondary | ICD-10-CM | POA: Insufficient documentation

## 2017-08-12 DIAGNOSIS — J449 Chronic obstructive pulmonary disease, unspecified: Secondary | ICD-10-CM | POA: Insufficient documentation

## 2017-08-12 DIAGNOSIS — M810 Age-related osteoporosis without current pathological fracture: Secondary | ICD-10-CM | POA: Insufficient documentation

## 2017-08-12 DIAGNOSIS — Z5189 Encounter for other specified aftercare: Secondary | ICD-10-CM | POA: Insufficient documentation

## 2017-08-13 ENCOUNTER — Telehealth: Payer: Self-pay | Admitting: *Deleted

## 2017-08-13 NOTE — Telephone Encounter (Signed)
Called back patient and LVM regarding the VM she left nurse. Nurse asked the patient to contact her back due to she was unable to clearly hear the message that the patient left. Nurse will call the patient back at a later date to see if she can contact her directly.

## 2017-08-13 NOTE — Telephone Encounter (Signed)
Patient called nurse back and discussed searching for assisted living facilities. Nurse provided resource information to begin search. Patient will call nurse back if she has any further questions or concerns.

## 2017-08-14 ENCOUNTER — Encounter (HOSPITAL_COMMUNITY): Payer: Self-pay

## 2017-08-18 ENCOUNTER — Telehealth: Payer: Self-pay | Admitting: Internal Medicine

## 2017-08-18 NOTE — Telephone Encounter (Signed)
#   vials:1 Ordered date:08/18/17 Shipping Date:08/18/17

## 2017-08-19 ENCOUNTER — Encounter (HOSPITAL_COMMUNITY)
Admission: RE | Admit: 2017-08-19 | Discharge: 2017-08-19 | Disposition: A | Discharge status: Home / Self Care | Payer: Self-pay | Source: Ambulatory Visit | Attending: Pulmonary Disease | Admitting: Pulmonary Disease

## 2017-08-19 NOTE — Telephone Encounter (Signed)
#   Vials:1 Arrival Date:08/19/17 Lot #:[id number]

## 2017-08-20 ENCOUNTER — Other Ambulatory Visit: Payer: Self-pay | Admitting: Internal Medicine

## 2017-08-21 ENCOUNTER — Encounter (HOSPITAL_COMMUNITY)
Admission: RE | Admit: 2017-08-21 | Discharge: 2017-08-21 | Disposition: A | Discharge status: Home / Self Care | Payer: Self-pay | Source: Ambulatory Visit | Attending: Pulmonary Disease | Admitting: Pulmonary Disease

## 2017-08-21 NOTE — Telephone Encounter (Signed)
[facility] [address]

## 2017-08-22 ENCOUNTER — Telehealth: Payer: Self-pay | Admitting: *Deleted

## 2017-08-22 NOTE — Telephone Encounter (Signed)
I filled out the SMN faxed it along with Ins. Cards, Meds & allergies, allergy profile and IgE level. It was denied, we sent the appeal, it was denied. We sent everything they asked for, still no nucala. Pt. Is still on xolair. They told us she could get nucala because she as 2 gov. Ins. Not so, we have several people that do. So we jumped through more hoops, no nucala. Pt. Is going go with [facility]. I will have her to give the girls up front a copy of her card, then I will re enroll her with her new ins.. Nothing further needed at this time.

## 2017-08-26 ENCOUNTER — Encounter (HOSPITAL_COMMUNITY)
Admission: RE | Admit: 2017-08-26 | Discharge: 2017-08-26 | Disposition: A | Discharge status: Home / Self Care | Payer: Self-pay | Source: Ambulatory Visit | Attending: Pulmonary Disease | Admitting: Pulmonary Disease

## 2017-08-28 ENCOUNTER — Encounter (HOSPITAL_COMMUNITY)
Admission: RE | Admit: 2017-08-28 | Discharge: 2017-08-28 | Disposition: A | Discharge status: Home / Self Care | Payer: Self-pay | Source: Ambulatory Visit | Attending: Pulmonary Disease | Admitting: Pulmonary Disease

## 2017-09-01 ENCOUNTER — Ambulatory Visit (INDEPENDENT_AMBULATORY_CARE_PROVIDER_SITE_OTHER): Payer: Medicare Other

## 2017-09-01 DIAGNOSIS — J4550 Severe persistent asthma, uncomplicated: Secondary | ICD-10-CM | POA: Diagnosis not present

## 2017-09-02 ENCOUNTER — Encounter (HOSPITAL_COMMUNITY)
Admission: RE | Admit: 2017-09-02 | Discharge: 2017-09-02 | Disposition: A | Discharge status: Home / Self Care | Payer: Self-pay | Source: Ambulatory Visit | Attending: Pulmonary Disease | Admitting: Pulmonary Disease

## 2017-09-02 MED ORDER — OMALIZUMAB 150 MG ~~LOC~~ SOLR
150.0000 mg | Freq: Once | SUBCUTANEOUS | Status: AC
  Administered 2017-09-01: 150 mg via SUBCUTANEOUS

## 2017-09-04 ENCOUNTER — Encounter (HOSPITAL_COMMUNITY)
Admission: RE | Admit: 2017-09-04 | Discharge: 2017-09-04 | Disposition: A | Discharge status: Home / Self Care | Payer: Self-pay | Source: Ambulatory Visit | Attending: Pulmonary Disease | Admitting: Pulmonary Disease

## 2017-09-04 ENCOUNTER — Telehealth: Payer: Self-pay | Admitting: Internal Medicine

## 2017-09-04 NOTE — Telephone Encounter (Signed)
#   vials:1 Ordered date:09/04/17 Shipping Date:09/04/17

## 2017-09-05 NOTE — Telephone Encounter (Signed)
#   Vials:1 Arrival Date:09/05/17 Lot #:[id number] [id number]

## 2017-09-09 ENCOUNTER — Encounter (HOSPITAL_COMMUNITY)
Admission: RE | Admit: 2017-09-09 | Discharge: 2017-09-09 | Disposition: A | Discharge status: Home / Self Care | Payer: Self-pay | Source: Ambulatory Visit | Attending: Pulmonary Disease | Admitting: Pulmonary Disease

## 2017-09-11 ENCOUNTER — Encounter (HOSPITAL_COMMUNITY)
Admission: RE | Admit: 2017-09-11 | Discharge: 2017-09-11 | Disposition: A | Discharge status: Home / Self Care | Payer: Self-pay | Source: Ambulatory Visit | Attending: Pulmonary Disease | Admitting: Pulmonary Disease

## 2017-09-11 DIAGNOSIS — J449 Chronic obstructive pulmonary disease, unspecified: Secondary | ICD-10-CM | POA: Insufficient documentation

## 2017-09-11 DIAGNOSIS — Z5189 Encounter for other specified aftercare: Secondary | ICD-10-CM | POA: Insufficient documentation

## 2017-09-11 DIAGNOSIS — K219 Gastro-esophageal reflux disease without esophagitis: Secondary | ICD-10-CM | POA: Insufficient documentation

## 2017-09-11 DIAGNOSIS — G4730 Sleep apnea, unspecified: Secondary | ICD-10-CM | POA: Insufficient documentation

## 2017-09-11 DIAGNOSIS — J45909 Unspecified asthma, uncomplicated: Secondary | ICD-10-CM | POA: Insufficient documentation

## 2017-09-11 DIAGNOSIS — M810 Age-related osteoporosis without current pathological fracture: Secondary | ICD-10-CM | POA: Insufficient documentation

## 2017-09-16 ENCOUNTER — Encounter (HOSPITAL_COMMUNITY)
Admission: RE | Admit: 2017-09-16 | Discharge: 2017-09-16 | Disposition: A | Discharge status: Home / Self Care | Payer: Self-pay | Source: Ambulatory Visit | Attending: Pulmonary Disease | Admitting: Pulmonary Disease

## 2017-09-16 NOTE — Congregational Nurse Program (Signed)
Congregational Nurse Program Note  Date of Encounter: 09/16/2017  Past Medical History: Past Medical History:  Diagnosis Date  . Allergic asthma    h/o  . ALLERGIC RHINITIS   . Anxiety   . Bronchiectasis    h/o  . Chronic obstructive asthma    PFT 11/06/10 - FEV1 1.24/ 0.62; FEV1/FVC 0.56, TLC 0.78; DLCO 0.75  . Chronic rhinosinusitis   . Colon polyp, hyperplastic 02/2003, 03/2014  . COPD (chronic obstructive pulmonary disease) ([place])   . Gastroparesis 2010  . GERD (gastroesophageal reflux disease)   . Helicobacter pylori gastritis 02/2009   partially treated  . Hiatal hernia   . Hyperlipidemia   . Hypertension   . Osteoporosis   . Sleep apnea     Encounter Details: CNP Questionnaire - 09/16/17 1414      Questionnaire   Patient Status  Not Applicable    Race  Black or African American    Location Patient Served At  Not Applicable    Insurance  Medicare    Uninsured  Not Applicable    Food  No food insecurities    Housing/Utilities  Yes, have permanent housing    Transportation  Yes, need transportation assistance    Interpersonal Safety  Yes, feel physically and emotionally safe where you currently live    Medication  No medication insecurities    Medical Provider  Yes    Referrals  Not Applicable    ED Visit Averted  Not Applicable    Life-Saving Intervention Made  Not Applicable     Madelaine Etienne, Kenneth, [phone].

## 2017-09-18 ENCOUNTER — Encounter (HOSPITAL_COMMUNITY)
Admission: RE | Admit: 2017-09-18 | Discharge: 2017-09-18 | Disposition: A | Discharge status: Home / Self Care | Payer: Self-pay | Source: Ambulatory Visit | Attending: Pulmonary Disease | Admitting: Pulmonary Disease

## 2017-09-22 ENCOUNTER — Encounter: Payer: Self-pay | Admitting: Internal Medicine

## 2017-09-22 ENCOUNTER — Ambulatory Visit (INDEPENDENT_AMBULATORY_CARE_PROVIDER_SITE_OTHER): Payer: Medicare Other | Admitting: Internal Medicine

## 2017-09-22 VITALS — BP 152/80 | HR 58 | Ht 64.0 in | Wt 174.8 lb

## 2017-09-22 DIAGNOSIS — J82 Pulmonary eosinophilia, not elsewhere classified: Secondary | ICD-10-CM | POA: Diagnosis not present

## 2017-09-22 DIAGNOSIS — J4541 Moderate persistent asthma with (acute) exacerbation: Secondary | ICD-10-CM | POA: Diagnosis not present

## 2017-09-22 DIAGNOSIS — R768 Other specified abnormal immunological findings in serum: Secondary | ICD-10-CM

## 2017-09-22 DIAGNOSIS — J8283 Eosinophilic asthma: Secondary | ICD-10-CM

## 2017-09-22 LAB — NITRIC OXIDE: NITRIC OXIDE: 55

## 2017-09-22 MED ORDER — PREDNISONE 10 MG PO TABS: ORAL_TABLET | ORAL | 0 refills | Status: DC

## 2017-09-22 MED ORDER — BUDESONIDE-FORMOTEROL FUMARATE 160-4.5 MCG/ACT IN AERO: 2.0000 | INHALATION_SPRAY | Freq: Two times a day (BID) | RESPIRATORY_TRACT | 0 refills | Status: DC

## 2017-09-22 MED ORDER — AZITHROMYCIN 250 MG PO TABS: ORAL_TABLET | ORAL | 0 refills | Status: DC

## 2017-09-22 MED ORDER — TIOTROPIUM BROMIDE MONOHYDRATE 2.5 MCG/ACT IN AERS: 2.0000 | INHALATION_SPRAY | Freq: Every day | RESPIRATORY_TRACT | 3 refills | Status: DC

## 2017-09-22 MED ORDER — OSELTAMIVIR PHOSPHATE 75 MG PO CAPS: 75.0000 mg | ORAL_CAPSULE | Freq: Two times a day (BID) | ORAL | 0 refills | Status: DC

## 2017-09-22 NOTE — Patient Instructions (Addendum)
ICD-10-CM   1. Moderate persistent asthma with acute exacerbation J45.41   2. 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 Elevated IgE level R76.8     You are in flare up  Plan - Z pak x 1 -Take prednisone 40 mg daily x 2 days, then 20mg  daily x 2 days, then 10mg  daily x 2 days, then 5mg  daily x 2 days and stop -Continue singulair, xolair, clariti, symbicort and spiriva respimat; nurse to ensure refills and samples if you neeed -We are holding off other biologicals like nucala due to personal preference and insurance issues -Due to flu shot allergy - take an advance prescription of tamiflu for future use (do not use for this flare up which I do not think is from flu)  Followup 3-6 months or sooner if needed

## 2017-09-22 NOTE — Addendum Note (Signed)
Addended by: Lorretta Harp on: 09/22/2017 10:15 AM   Modules accepted: Orders
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
## 2017-09-23 ENCOUNTER — Other Ambulatory Visit: Payer: Self-pay | Admitting: Internal Medicine

## 2017-09-23 ENCOUNTER — Encounter (HOSPITAL_COMMUNITY)
Admission: RE | Admit: 2017-09-23 | Discharge: 2017-09-23 | Disposition: A | Discharge status: Home / Self Care | Payer: Self-pay | Source: Ambulatory Visit | Attending: Pulmonary Disease | Admitting: Pulmonary Disease

## 2017-09-25 ENCOUNTER — Encounter (HOSPITAL_COMMUNITY): Payer: Self-pay

## 2017-09-30 ENCOUNTER — Encounter (HOSPITAL_COMMUNITY)
Admission: RE | Admit: 2017-09-30 | Discharge: 2017-09-30 | Disposition: A | Discharge status: Home / Self Care | Payer: Self-pay | Source: Ambulatory Visit | Attending: Pulmonary Disease | Admitting: Pulmonary Disease

## 2017-10-03 ENCOUNTER — Ambulatory Visit (INDEPENDENT_AMBULATORY_CARE_PROVIDER_SITE_OTHER): Payer: Medicare Other

## 2017-10-03 DIAGNOSIS — J4540 Moderate persistent asthma, uncomplicated: Secondary | ICD-10-CM

## 2017-10-07 ENCOUNTER — Encounter (HOSPITAL_COMMUNITY)
Admission: RE | Admit: 2017-10-07 | Discharge: 2017-10-07 | Disposition: A | Discharge status: Home / Self Care | Payer: Self-pay | Source: Ambulatory Visit | Attending: Pulmonary Disease | Admitting: Pulmonary Disease

## 2017-10-09 ENCOUNTER — Encounter (HOSPITAL_COMMUNITY)
Admission: RE | Admit: 2017-10-09 | Discharge: 2017-10-09 | Disposition: A | Discharge status: Home / Self Care | Payer: Self-pay | Source: Ambulatory Visit | Attending: Pulmonary Disease | Admitting: Pulmonary Disease

## 2017-10-10 MED ORDER — OMALIZUMAB 150 MG ~~LOC~~ SOLR
150.0000 mg | Freq: Once | SUBCUTANEOUS | Status: AC
  Administered 2017-10-03: 150 mg via SUBCUTANEOUS

## 2017-10-10 NOTE — Congregational Nurse Program (Signed)
Congregational Nurse Program Note  Date of Encounter: 10/10/2017  Past Medical History: Past Medical History:  Diagnosis Date  . Allergic asthma    h/o  . ALLERGIC RHINITIS   . Anxiety   . Bronchiectasis    h/o  . Chronic obstructive asthma    PFT 11/06/10 - FEV1 1.24/ 0.62; FEV1/FVC 0.56, TLC 0.78; DLCO 0.75  . Chronic rhinosinusitis   . Colon polyp, hyperplastic 02/2003, 03/2014  . COPD (chronic obstructive pulmonary disease) ([place])   . Gastroparesis 2010  . GERD (gastroesophageal reflux disease)   . Helicobacter pylori gastritis 02/2009   partially treated  . Hiatal hernia   . Hyperlipidemia   . Hypertension   . Osteoporosis   . Sleep apnea     Encounter Details: CNP Questionnaire - 10/10/17 2228      Questionnaire   Patient Status  Not Applicable    Race  Black or African American    Location Patient Served At  Not Applicable    Insurance  Medicare    Uninsured  Not Applicable    Food  No food insecurities    Housing/Utilities  Yes, have permanent housing    Transportation  Yes, need transportation assistance    Interpersonal Safety  Yes, feel physically and emotionally safe where you currently live    Medication  No medication insecurities    Medical Provider  Yes    Referrals  Not Applicable    ED Visit Averted  Not Applicable    Life-Saving Intervention Made  Not Applicable     CNP Nurse, Madelaine Etienne, [phone].

## 2017-10-12 ENCOUNTER — Other Ambulatory Visit: Payer: Self-pay | Admitting: Family

## 2017-10-14 ENCOUNTER — Encounter (HOSPITAL_COMMUNITY)
Admission: RE | Admit: 2017-10-14 | Discharge: 2017-10-14 | Disposition: A | Discharge status: Home / Self Care | Payer: Self-pay | Source: Ambulatory Visit | Attending: Pulmonary Disease | Admitting: Pulmonary Disease

## 2017-10-14 DIAGNOSIS — G4730 Sleep apnea, unspecified: Secondary | ICD-10-CM | POA: Insufficient documentation

## 2017-10-14 DIAGNOSIS — J45909 Unspecified asthma, uncomplicated: Secondary | ICD-10-CM | POA: Insufficient documentation

## 2017-10-14 DIAGNOSIS — J449 Chronic obstructive pulmonary disease, unspecified: Secondary | ICD-10-CM | POA: Insufficient documentation

## 2017-10-14 DIAGNOSIS — K219 Gastro-esophageal reflux disease without esophagitis: Secondary | ICD-10-CM | POA: Insufficient documentation

## 2017-10-14 DIAGNOSIS — Z5189 Encounter for other specified aftercare: Secondary | ICD-10-CM | POA: Insufficient documentation

## 2017-10-14 DIAGNOSIS — M810 Age-related osteoporosis without current pathological fracture: Secondary | ICD-10-CM | POA: Insufficient documentation

## 2017-10-16 ENCOUNTER — Encounter (HOSPITAL_COMMUNITY): Payer: Self-pay

## 2017-10-21 ENCOUNTER — Encounter (HOSPITAL_COMMUNITY): Payer: Self-pay

## 2017-10-23 ENCOUNTER — Encounter (HOSPITAL_COMMUNITY): Payer: Self-pay

## 2017-10-25 ENCOUNTER — Other Ambulatory Visit: Payer: Self-pay | Admitting: Internal Medicine

## 2017-10-27 ENCOUNTER — Telehealth: Payer: Self-pay | Admitting: Internal Medicine

## 2017-10-27 MED ORDER — BUDESONIDE-FORMOTEROL FUMARATE 160-4.5 MCG/ACT IN AERO: 2.0000 | INHALATION_SPRAY | Freq: Two times a day (BID) | RESPIRATORY_TRACT | 1 refills | Status: DC

## 2017-10-27 MED ORDER — TIOTROPIUM BROMIDE MONOHYDRATE 2.5 MCG/ACT IN AERS: 2.0000 | INHALATION_SPRAY | Freq: Every day | RESPIRATORY_TRACT | 1 refills | Status: DC

## 2017-10-27 NOTE — Telephone Encounter (Signed)
Spoke with pt. She needs 90 day supplies of Spiriva and Symbicort. Rxs have been sent in. Nothing further was needed.

## 2017-10-28 ENCOUNTER — Encounter (HOSPITAL_COMMUNITY)
Admission: RE | Admit: 2017-10-28 | Discharge: 2017-10-28 | Disposition: A | Discharge status: Home / Self Care | Payer: Self-pay | Source: Ambulatory Visit | Attending: Pulmonary Disease | Admitting: Pulmonary Disease

## 2017-10-28 ENCOUNTER — Telehealth: Payer: Self-pay | Admitting: Internal Medicine

## 2017-10-29 ENCOUNTER — Ambulatory Visit: Payer: Medicare Other | Admitting: Cardiovascular Disease

## 2017-10-30 ENCOUNTER — Encounter (HOSPITAL_COMMUNITY)
Admission: RE | Admit: 2017-10-30 | Discharge: 2017-10-30 | Disposition: A | Discharge status: Home / Self Care | Payer: Self-pay | Source: Ambulatory Visit | Attending: Pulmonary Disease | Admitting: Pulmonary Disease

## 2017-10-30 NOTE — Telephone Encounter (Signed)
I was off, [name] [name] ordered late in the day, there was an emergency staff meeting. # vials:1 Ordered date:10/28/17 Shipping Date:10/28/17

## 2017-10-30 NOTE — Telephone Encounter (Signed)
#   Vials:1 Arrival Date:10/29/17 Lot #:[id number] Exp Date:02/2021 Order wasn't put in.

## 2017-10-31 ENCOUNTER — Ambulatory Visit (INDEPENDENT_AMBULATORY_CARE_PROVIDER_SITE_OTHER): Payer: Medicare Other

## 2017-10-31 DIAGNOSIS — J4540 Moderate persistent asthma, uncomplicated: Secondary | ICD-10-CM

## 2017-11-05 MED ORDER — OMALIZUMAB 150 MG ~~LOC~~ SOLR
150.0000 mg | Freq: Once | SUBCUTANEOUS | Status: AC
  Administered 2017-10-31: 150 mg via SUBCUTANEOUS

## 2017-11-06 ENCOUNTER — Encounter (HOSPITAL_COMMUNITY): Payer: Self-pay

## 2017-11-13 ENCOUNTER — Encounter (HOSPITAL_COMMUNITY)
Admission: RE | Admit: 2017-11-13 | Discharge: 2017-11-13 | Disposition: A | Discharge status: Home / Self Care | Payer: Self-pay | Source: Ambulatory Visit | Attending: Pulmonary Disease | Admitting: Pulmonary Disease

## 2017-11-13 DIAGNOSIS — M810 Age-related osteoporosis without current pathological fracture: Secondary | ICD-10-CM | POA: Insufficient documentation

## 2017-11-13 DIAGNOSIS — K219 Gastro-esophageal reflux disease without esophagitis: Secondary | ICD-10-CM | POA: Insufficient documentation

## 2017-11-13 DIAGNOSIS — Z5189 Encounter for other specified aftercare: Secondary | ICD-10-CM | POA: Insufficient documentation

## 2017-11-13 DIAGNOSIS — J45909 Unspecified asthma, uncomplicated: Secondary | ICD-10-CM | POA: Insufficient documentation

## 2017-11-13 DIAGNOSIS — J449 Chronic obstructive pulmonary disease, unspecified: Secondary | ICD-10-CM | POA: Insufficient documentation

## 2017-11-13 DIAGNOSIS — G4730 Sleep apnea, unspecified: Secondary | ICD-10-CM | POA: Insufficient documentation

## 2017-11-18 ENCOUNTER — Encounter (HOSPITAL_COMMUNITY): Payer: Self-pay

## 2017-11-20 ENCOUNTER — Telehealth: Payer: Self-pay | Admitting: Internal Medicine

## 2017-11-20 ENCOUNTER — Encounter (HOSPITAL_COMMUNITY)
Admission: RE | Admit: 2017-11-20 | Discharge: 2017-11-20 | Disposition: A | Discharge status: Home / Self Care | Payer: Self-pay | Source: Ambulatory Visit | Attending: Pulmonary Disease | Admitting: Pulmonary Disease

## 2017-11-20 NOTE — Telephone Encounter (Signed)
#   PFS:1 Ordered date:11/20/17 Shipping Date:11/20/17

## 2017-11-21 ENCOUNTER — Telehealth: Payer: Self-pay | Admitting: Internal Medicine

## 2017-11-21 NOTE — Telephone Encounter (Signed)
#   PFS:1 Arrival Date:11/21/17 Lot #:[id number] Exp Date:10/2018

## 2017-11-21 NOTE — Telephone Encounter (Signed)
Error

## 2017-11-25 ENCOUNTER — Encounter (HOSPITAL_COMMUNITY): Payer: Self-pay

## 2017-11-25 ENCOUNTER — Other Ambulatory Visit: Payer: Self-pay | Admitting: Internal Medicine

## 2017-11-26 NOTE — Telephone Encounter (Signed)
Control database checked last refill: 10/23/2017 LOV: 5/18//18

## 2017-11-27 ENCOUNTER — Encounter (HOSPITAL_COMMUNITY)
Admission: RE | Admit: 2017-11-27 | Discharge: 2017-11-27 | Disposition: A | Discharge status: Home / Self Care | Payer: Self-pay | Source: Ambulatory Visit | Attending: Pulmonary Disease | Admitting: Pulmonary Disease

## 2017-12-01 ENCOUNTER — Ambulatory Visit (INDEPENDENT_AMBULATORY_CARE_PROVIDER_SITE_OTHER): Payer: Medicare Other

## 2017-12-01 ENCOUNTER — Ambulatory Visit: Payer: Medicare Other

## 2017-12-01 DIAGNOSIS — J4540 Moderate persistent asthma, uncomplicated: Secondary | ICD-10-CM

## 2017-12-02 ENCOUNTER — Ambulatory Visit: Payer: Medicare Other

## 2017-12-02 ENCOUNTER — Encounter (HOSPITAL_COMMUNITY)
Admission: RE | Admit: 2017-12-02 | Discharge: 2017-12-02 | Disposition: A | Discharge status: Home / Self Care | Payer: Self-pay | Source: Ambulatory Visit | Attending: Pulmonary Disease | Admitting: Pulmonary Disease

## 2017-12-02 MED ORDER — OMALIZUMAB 150 MG ~~LOC~~ SOLR
150.0000 mg | SUBCUTANEOUS | Status: DC
  Administered 2017-12-01: 150 mg via SUBCUTANEOUS

## 2017-12-02 NOTE — Progress Notes (Signed)
Documentation of medication administration and charges of Xolair have been completed by Lindsay Lemons, CMA based on the Xolair documentation sheet completed by [name] Scott.  

## 2017-12-04 ENCOUNTER — Encounter (HOSPITAL_COMMUNITY): Payer: Self-pay

## 2017-12-09 ENCOUNTER — Encounter (HOSPITAL_COMMUNITY): Payer: Self-pay

## 2017-12-11 ENCOUNTER — Encounter (HOSPITAL_COMMUNITY)
Admission: RE | Admit: 2017-12-11 | Discharge: 2017-12-11 | Disposition: A | Discharge status: Home / Self Care | Payer: Self-pay | Source: Ambulatory Visit | Attending: Pulmonary Disease | Admitting: Pulmonary Disease

## 2017-12-11 NOTE — Congregational Nurse Program (Signed)
Congregational Nurse Program Note  Date of Encounter: 12/11/2017  Past Medical History: Past Medical History:  Diagnosis Date  . Allergic asthma    h/o  . ALLERGIC RHINITIS   . Anxiety   . Bronchiectasis    h/o  . Chronic obstructive asthma    PFT 11/06/10 - FEV1 1.24/ 0.62; FEV1/FVC 0.56, TLC 0.78; DLCO 0.75  . Chronic rhinosinusitis   . Colon polyp, hyperplastic 02/2003, 03/2014  . COPD (chronic obstructive pulmonary disease) ([place])   . Gastroparesis 2010  . GERD (gastroesophageal reflux disease)   . Helicobacter pylori gastritis 02/2009   partially treated  . Hiatal hernia   . Hyperlipidemia   . Hypertension   . Osteoporosis   . Sleep apnea     Encounter Details: CNP Questionnaire - 12/11/17 1638      Questionnaire   Patient Status  Not Applicable    Race  Black or African American    Location Patient Served At  Not Applicable    Insurance  Medicare    Uninsured  Not Applicable    Food  No food insecurities    Housing/Utilities  Yes, have permanent housing    Transportation  Yes, need transportation assistance    Interpersonal Safety  Yes, feel physically and emotionally safe where you currently live    Medication  No medication insecurities    Medical Provider  Yes    Referrals  Not Applicable    ED Visit Averted  Not Applicable    Life-Saving Intervention Made  Not Applicable

## 2017-12-16 ENCOUNTER — Encounter (HOSPITAL_COMMUNITY)
Admission: RE | Admit: 2017-12-16 | Discharge: 2017-12-16 | Disposition: A | Discharge status: Home / Self Care | Payer: Self-pay | Source: Ambulatory Visit | Attending: Pulmonary Disease | Admitting: Pulmonary Disease

## 2017-12-16 DIAGNOSIS — J45909 Unspecified asthma, uncomplicated: Secondary | ICD-10-CM | POA: Insufficient documentation

## 2017-12-16 DIAGNOSIS — K219 Gastro-esophageal reflux disease without esophagitis: Secondary | ICD-10-CM | POA: Insufficient documentation

## 2017-12-16 DIAGNOSIS — G4730 Sleep apnea, unspecified: Secondary | ICD-10-CM | POA: Insufficient documentation

## 2017-12-16 DIAGNOSIS — J449 Chronic obstructive pulmonary disease, unspecified: Secondary | ICD-10-CM | POA: Insufficient documentation

## 2017-12-16 DIAGNOSIS — Z5189 Encounter for other specified aftercare: Secondary | ICD-10-CM | POA: Insufficient documentation

## 2017-12-16 DIAGNOSIS — M810 Age-related osteoporosis without current pathological fracture: Secondary | ICD-10-CM | POA: Insufficient documentation

## 2017-12-18 ENCOUNTER — Encounter (HOSPITAL_COMMUNITY): Payer: Self-pay

## 2017-12-22 ENCOUNTER — Other Ambulatory Visit: Payer: Self-pay | Admitting: Internal Medicine

## 2017-12-23 ENCOUNTER — Encounter (HOSPITAL_COMMUNITY): Payer: Self-pay

## 2017-12-25 ENCOUNTER — Telehealth: Payer: Self-pay | Admitting: Internal Medicine

## 2017-12-25 ENCOUNTER — Encounter (HOSPITAL_COMMUNITY): Payer: Self-pay

## 2017-12-26 ENCOUNTER — Ambulatory Visit: Payer: Medicare Other | Admitting: Cardiovascular Disease

## 2017-12-26 NOTE — Telephone Encounter (Signed)
#   vials:1 prefilled syringe  Ordered date:2.14.19 Shipping Date:2.15.19   # Vials:1 pre filled syringe Arrival Date:2.15.19 Lot #:[id number] Exp Date:10/2018

## 2017-12-30 ENCOUNTER — Encounter (HOSPITAL_COMMUNITY): Payer: Self-pay

## 2017-12-31 ENCOUNTER — Telehealth: Payer: Self-pay

## 2017-12-31 NOTE — Telephone Encounter (Signed)
PA approved from 11/09/2017-11/10/2018 

## 2018-01-01 ENCOUNTER — Encounter (HOSPITAL_COMMUNITY): Payer: Self-pay

## 2018-01-02 ENCOUNTER — Ambulatory Visit: Payer: Medicare Other | Admitting: Internal Medicine

## 2018-01-02 ENCOUNTER — Ambulatory Visit (INDEPENDENT_AMBULATORY_CARE_PROVIDER_SITE_OTHER): Payer: Medicare Other

## 2018-01-02 ENCOUNTER — Telehealth: Payer: Self-pay | Admitting: Internal Medicine

## 2018-01-02 DIAGNOSIS — J4540 Moderate persistent asthma, uncomplicated: Secondary | ICD-10-CM

## 2018-01-02 MED ORDER — BUDESONIDE-FORMOTEROL FUMARATE 160-4.5 MCG/ACT IN AERO: 2.0000 | INHALATION_SPRAY | Freq: Two times a day (BID) | RESPIRATORY_TRACT | 0 refills | Status: DC

## 2018-01-02 NOTE — Telephone Encounter (Signed)
Have gotten samples ready for pt and taking them up front for pt.  Nothing further needed at this current time.

## 2018-01-05 MED ORDER — OMALIZUMAB 150 MG ~~LOC~~ SOLR
150.0000 mg | Freq: Once | SUBCUTANEOUS | Status: AC
  Administered 2018-01-02: 150 mg via SUBCUTANEOUS

## 2018-01-06 ENCOUNTER — Encounter (HOSPITAL_COMMUNITY)
Admission: RE | Admit: 2018-01-06 | Discharge: 2018-01-06 | Disposition: A | Discharge status: Home / Self Care | Payer: Self-pay | Source: Ambulatory Visit | Attending: Internal Medicine | Admitting: Internal Medicine

## 2018-01-08 ENCOUNTER — Encounter (HOSPITAL_COMMUNITY)
Admission: RE | Admit: 2018-01-08 | Discharge: 2018-01-08 | Disposition: A | Discharge status: Home / Self Care | Payer: Self-pay | Source: Ambulatory Visit | Attending: Internal Medicine | Admitting: Internal Medicine

## 2018-01-13 ENCOUNTER — Encounter (HOSPITAL_COMMUNITY): Payer: Self-pay

## 2018-01-13 DIAGNOSIS — J449 Chronic obstructive pulmonary disease, unspecified: Secondary | ICD-10-CM | POA: Insufficient documentation

## 2018-01-13 DIAGNOSIS — Z5189 Encounter for other specified aftercare: Secondary | ICD-10-CM | POA: Insufficient documentation

## 2018-01-13 DIAGNOSIS — J45909 Unspecified asthma, uncomplicated: Secondary | ICD-10-CM | POA: Insufficient documentation

## 2018-01-13 DIAGNOSIS — K219 Gastro-esophageal reflux disease without esophagitis: Secondary | ICD-10-CM | POA: Insufficient documentation

## 2018-01-13 DIAGNOSIS — G4730 Sleep apnea, unspecified: Secondary | ICD-10-CM | POA: Insufficient documentation

## 2018-01-13 DIAGNOSIS — M810 Age-related osteoporosis without current pathological fracture: Secondary | ICD-10-CM | POA: Insufficient documentation

## 2018-01-15 ENCOUNTER — Encounter (HOSPITAL_COMMUNITY)
Admission: RE | Admit: 2018-01-15 | Discharge: 2018-01-15 | Disposition: A | Discharge status: Home / Self Care | Payer: Self-pay | Source: Ambulatory Visit | Attending: Pulmonary Disease | Admitting: Pulmonary Disease

## 2018-01-15 ENCOUNTER — Telehealth: Payer: Self-pay

## 2018-01-15 NOTE — Telephone Encounter (Signed)
Pa approved 11/09/2017-11/10/2018

## 2018-01-16 ENCOUNTER — Ambulatory Visit (INDEPENDENT_AMBULATORY_CARE_PROVIDER_SITE_OTHER): Payer: Medicare Other | Admitting: Internal Medicine

## 2018-01-16 ENCOUNTER — Other Ambulatory Visit: Payer: Self-pay | Admitting: Internal Medicine

## 2018-01-16 ENCOUNTER — Encounter: Payer: Self-pay | Admitting: Internal Medicine

## 2018-01-16 VITALS — BP 164/82 | HR 64 | Ht 64.5 in | Wt 171.0 lb

## 2018-01-16 DIAGNOSIS — J4541 Moderate persistent asthma with (acute) exacerbation: Secondary | ICD-10-CM

## 2018-01-16 DIAGNOSIS — J4540 Moderate persistent asthma, uncomplicated: Secondary | ICD-10-CM | POA: Diagnosis not present

## 2018-01-16 DIAGNOSIS — J8283 Eosinophilic asthma: Secondary | ICD-10-CM

## 2018-01-16 DIAGNOSIS — J82 Pulmonary eosinophilia, not elsewhere classified: Secondary | ICD-10-CM | POA: Diagnosis not present

## 2018-01-16 DIAGNOSIS — R768 Other specified abnormal immunological findings in serum: Secondary | ICD-10-CM | POA: Diagnosis not present

## 2018-01-16 LAB — NITRIC OXIDE: NITRIC OXIDE: 44

## 2018-01-16 MED ORDER — AZITHROMYCIN 250 MG PO TABS: ORAL_TABLET | ORAL | 0 refills | Status: DC

## 2018-01-16 MED ORDER — PREDNISONE 10 MG PO TABS: ORAL_TABLET | ORAL | 0 refills | Status: DC
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
## 2018-01-16 NOTE — Patient Instructions (Addendum)
ICD-10-CM   1. Moderate persistent asthma without complication A16.55 Nitric oxide  2. Moderate persistent asthma with acute exacerbation J45.41   3. 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 Elevated IgE level R76.8    Take prednisone 40 mg daily x 2 days, then 20mg  daily x 2 days, then 10mg  daily x 2 days, then 5mg  daily x 2 days and stop  Z pak  Continue spiriva, singulair, and symbicort scheduled + albuterol as needed  Continue xolair  Take product information on Dupulimab; I tink this instead of xolair or in addition to [place] can help you  Followup 3 months or sooner

## 2018-01-19 ENCOUNTER — Ambulatory Visit (INDEPENDENT_AMBULATORY_CARE_PROVIDER_SITE_OTHER): Payer: Medicare Other | Admitting: Internal Medicine

## 2018-01-19 ENCOUNTER — Other Ambulatory Visit (INDEPENDENT_AMBULATORY_CARE_PROVIDER_SITE_OTHER): Payer: Medicare Other

## 2018-01-19 ENCOUNTER — Encounter: Payer: Self-pay | Admitting: Internal Medicine

## 2018-01-19 VITALS — BP 170/90 | HR 68 | Temp 97.7°F | Ht 64.5 in | Wt 174.0 lb

## 2018-01-19 DIAGNOSIS — F419 Anxiety disorder, unspecified: Secondary | ICD-10-CM

## 2018-01-19 DIAGNOSIS — M7071 Other bursitis of hip, right hip: Secondary | ICD-10-CM

## 2018-01-19 DIAGNOSIS — I10 Essential (primary) hypertension: Secondary | ICD-10-CM | POA: Diagnosis not present

## 2018-01-19 DIAGNOSIS — R7301 Impaired fasting glucose: Secondary | ICD-10-CM | POA: Diagnosis not present

## 2018-01-19 DIAGNOSIS — E785 Hyperlipidemia, unspecified: Secondary | ICD-10-CM | POA: Diagnosis not present

## 2018-01-19 LAB — CBC
HCT: 37.1 % (ref 36.0–46.0)
HEMOGLOBIN: 12 g/dL (ref 12.0–15.0)
MCHC: 32.5 g/dL (ref 30.0–36.0)
MCV: 85.5 fl (ref 78.0–100.0)
PLATELETS: 209 10*3/uL (ref 150.0–400.0)
RBC: 4.34 Mil/uL (ref 3.87–5.11)
RDW: 13.8 % (ref 11.5–15.5)
WBC: 6.8 10*3/uL (ref 4.0–10.5)

## 2018-01-19 LAB — COMPREHENSIVE METABOLIC PANEL
ALT: 9 U/L (ref 0–35)
AST: 15 U/L (ref 0–37)
Albumin: 3.7 g/dL (ref 3.5–5.2)
Alkaline Phosphatase: 91 U/L (ref 39–117)
BILIRUBIN TOTAL: 0.3 mg/dL (ref 0.2–1.2)
BUN: 17 mg/dL (ref 6–23)
CALCIUM: 9.4 mg/dL (ref 8.4–10.5)
CHLORIDE: 108 meq/L (ref 96–112)
CO2: 29 meq/L (ref 19–32)
Creatinine, Ser: 1.24 mg/dL — ABNORMAL HIGH (ref 0.40–1.20)
GFR: 54.24 mL/min — AB (ref >60.00)
GLUCOSE: 90 mg/dL (ref 70–99)
POTASSIUM: 4 meq/L (ref 3.5–5.1)
Sodium: 141 mEq/L (ref 135–145)
Total Protein: 7.3 g/dL (ref 6.0–8.3)

## 2018-01-19 LAB — LIPID PANEL
CHOL/HDL RATIO: 4
Cholesterol: 199 mg/dL (ref 0–200)
HDL: 46.9 mg/dL (ref >39.00)
LDL CALC: 124 mg/dL — AB (ref 0–99)
NONHDL: 152.02
TRIGLYCERIDES: 141 mg/dL (ref 0.0–149.0)
VLDL: 28.2 mg/dL (ref 0.0–40.0)

## 2018-01-19 LAB — HEMOGLOBIN A1C: Hgb A1c MFr Bld: 5.8 % (ref 4.6–6.5)

## 2018-01-19 NOTE — Patient Instructions (Signed)
We will get you in to physical therapy for the hip.  We are checking the labs today.

## 2018-01-20 ENCOUNTER — Encounter (HOSPITAL_COMMUNITY): Payer: Self-pay

## 2018-01-20 MED ORDER — AMLODIPINE BESYLATE 10 MG PO TABS: 10.0000 mg | ORAL_TABLET | Freq: Every day | ORAL | 1 refills | Status: DC

## 2018-01-20 MED ORDER — ALPRAZOLAM 0.25 MG PO TABS: ORAL_TABLET | ORAL | 5 refills | Status: DC

## 2018-01-20 NOTE — Assessment & Plan Note (Signed)
Increase amlodipine to 10 mg daily. Checking CMP and adjust as needed.

## 2018-01-20 NOTE — Progress Notes (Signed)
   Subjective:    Patient ID: [name] [name], female    DOB: Apr 07, 1941, 77 y.o.   MRN: [id number]  HPI The patient is a 77 YO female coming in for follow up of her blood pressure (taking amlodipine 5 mg daily, denies headaches or chest pains, denies side effects, cannot take beta blockers due to severe asthma), and her anxiety (she is taking xanax daily and needs refills on this, has anxiety if she cannot breathe, this helps her to be more functional, denies side effects), and her cholesterol (she is taking no medications for this, denies exercise due to breathing). No new concerns.   Review of Systems  Constitutional: Positive for activity change. Negative for appetite change, fatigue, fever and unexpected weight change.  HENT: Negative.   Eyes: Negative.   Respiratory: Positive for cough and shortness of breath. Negative for chest tightness.   Cardiovascular: Negative for chest pain, palpitations and leg swelling.  Gastrointestinal: Negative for abdominal distention, abdominal pain, constipation, diarrhea, nausea and vomiting.  Musculoskeletal: Negative.   Skin: Negative.   Neurological: Negative.   Psychiatric/Behavioral: Negative for behavioral problems, confusion, dysphoric mood, self-injury, sleep disturbance and suicidal ideas. The patient is nervous/anxious.       Objective:   Physical Exam  Constitutional: She is oriented to person, place, and time. She appears well-developed and well-nourished.  HENT:  Head: Normocephalic and atraumatic.  Eyes: EOM are normal.  Neck: Normal range of motion.  Cardiovascular: Normal rate and regular rhythm.  Pulmonary/Chest: Effort normal. No respiratory distress. She has no wheezes. She has no rales.  Lung exam stable from prior.  Abdominal: Soft. Bowel sounds are normal. She exhibits no distension. There is no tenderness. There is no rebound.  Musculoskeletal: She exhibits no edema.  Neurological: She is alert and oriented to person, place, and  time. Coordination normal.  Skin: Skin is warm and dry.  Psychiatric: She has a normal mood and affect.   Vitals:   01/19/18 1320 01/19/18 1359 01/19/18 1433  BP: (!) 160/90 (!) 170/100 (!) 170/90  Pulse: 68    Temp: 97.7 F (36.5 C)    TempSrc: Oral    SpO2: 98%    Weight: 174 lb (78.9 kg)    Height: 5' 4.5" (1.638 m)        Assessment & Plan:

## 2018-01-20 NOTE — Assessment & Plan Note (Signed)
Checking lipid panel and adjust as needed. Currently diet controlled and goal LDL <130.

## 2018-01-20 NOTE — Assessment & Plan Note (Signed)
Taking xanax daily prn for anxiety and denies need for depression medication. She denies counseling. Reminded about the risk of falls, memory changes. She accepts that risk and wishes to continue due to QOL. Refilled today.

## 2018-01-22 ENCOUNTER — Encounter (HOSPITAL_COMMUNITY)
Admission: RE | Admit: 2018-01-22 | Discharge: 2018-01-22 | Disposition: A | Discharge status: Home / Self Care | Payer: Self-pay | Source: Ambulatory Visit | Attending: Internal Medicine | Admitting: Internal Medicine

## 2018-01-23 ENCOUNTER — Telehealth: Payer: Self-pay | Admitting: Internal Medicine

## 2018-01-23 NOTE — Telephone Encounter (Signed)
Prefilled Syringe: #150mg  1  #75mg  0 Ordered Date: 3.15.19 Shipping Date: 3.18.19

## 2018-01-26 NOTE — Telephone Encounter (Signed)
Prefilled Syringes: # 150mg  1  #75mg  0 Arrival Date: 01/26/2018 Lot #: 150mg  [id number]      75mg  0 Exp Date: 150mg  10/2018   75mg  0

## 2018-01-27 ENCOUNTER — Encounter (HOSPITAL_COMMUNITY)
Admission: RE | Admit: 2018-01-27 | Discharge: 2018-01-27 | Disposition: A | Discharge status: Home / Self Care | Payer: Self-pay | Source: Ambulatory Visit | Attending: Internal Medicine | Admitting: Internal Medicine

## 2018-01-28 DIAGNOSIS — H43393 Other vitreous opacities, bilateral: Secondary | ICD-10-CM | POA: Diagnosis not present

## 2018-01-28 DIAGNOSIS — H10413 Chronic giant papillary conjunctivitis, bilateral: Secondary | ICD-10-CM | POA: Diagnosis not present

## 2018-01-28 DIAGNOSIS — H16223 Keratoconjunctivitis sicca, not specified as Sjogren's, bilateral: Secondary | ICD-10-CM | POA: Diagnosis not present

## 2018-01-28 DIAGNOSIS — Z961 Presence of intraocular lens: Secondary | ICD-10-CM | POA: Diagnosis not present

## 2018-01-28 DIAGNOSIS — H26492 Other secondary cataract, left eye: Secondary | ICD-10-CM | POA: Diagnosis not present

## 2018-01-29 ENCOUNTER — Encounter (HOSPITAL_COMMUNITY): Payer: Self-pay

## 2018-01-29 ENCOUNTER — Telehealth: Payer: Self-pay | Admitting: Internal Medicine

## 2018-01-29 NOTE — Telephone Encounter (Signed)
Copied from [place] [id number]. Topic: General - Other >> Jan 29, 2018  3:53 PM Marin Olp L wrote: Reason for CRM: Patient calling to notify [name] that the amLODipine (NORVASC) 10 MG tablet is too strong for her so she has not picked them up, however she can take the 5mg  tabs.

## 2018-01-30 ENCOUNTER — Ambulatory Visit: Payer: Medicare Other

## 2018-02-02 ENCOUNTER — Ambulatory Visit (INDEPENDENT_AMBULATORY_CARE_PROVIDER_SITE_OTHER): Payer: Medicare Other

## 2018-02-02 DIAGNOSIS — J4540 Moderate persistent asthma, uncomplicated: Secondary | ICD-10-CM | POA: Diagnosis not present

## 2018-02-02 NOTE — Telephone Encounter (Signed)
We increased this because her blood pressure is not well controlled and is too high. She should get and take the amlodipine 10 mg daily.

## 2018-02-02 NOTE — Telephone Encounter (Signed)
Called pt no answer LMOM w/MD response. Also sent CRM to [place] for FYI if pt  call back....[name] [name]

## 2018-02-03 ENCOUNTER — Telehealth: Payer: Self-pay | Admitting: Internal Medicine

## 2018-02-03 ENCOUNTER — Encounter (HOSPITAL_COMMUNITY): Payer: Self-pay

## 2018-02-03 MED ORDER — OMALIZUMAB 150 MG ~~LOC~~ SOLR
150.0000 mg | SUBCUTANEOUS | Status: DC
  Administered 2018-02-02: 150 mg via SUBCUTANEOUS

## 2018-02-03 NOTE — Telephone Encounter (Signed)
This is for pulmonary. They have called the patient today.

## 2018-02-03 NOTE — Telephone Encounter (Signed)
Pt handed a handicap plackard application to Al[facility] Bailiff 02/02/18 when she came in for her injection.  Handicap application has been filled out and signed by MR.  Left message for pt letting her know application has been filled out.  In the message, asked her to call us back to see if she wanted me to mail it to her or if she wanted to come by the office to pick it up.  Will leave this encounter open until pt returns call.

## 2018-02-03 NOTE — Telephone Encounter (Signed)
Patient returned call and asked [place] to mail handicap plackard to the address on file (which I verified).

## 2018-02-03 NOTE — Progress Notes (Signed)
Xolair injection documentation and charges entered by Laiklynn Raczynski, RMA, based on injection sheet filled out by [name] Scott during preparation and administration. This documentation process is due to office requirements.   

## 2018-02-03 NOTE — Telephone Encounter (Signed)
Copied from [place] [phone]. Topic: Quick Communication - Office Called Patient >> Feb 03, 2018  1:29 PM [name] [name] wrote: Reason for CRM: patient states that someone called her from the practice and didn't leave a message

## 2018-02-03 NOTE — Telephone Encounter (Signed)
  Patient called back and advised that she can not take 10mg  of norvasc because it causes her to cough.  Patient states she is only taking 5mg  and she is going to continue the 5mg .  Patient states she just wants to let [name] know that she is not going to take the 10 mg tablet.

## 2018-02-04 NOTE — Telephone Encounter (Signed)
Pt has been taken care of with the information needed.  Will close this encounter.

## 2018-02-04 NOTE — Telephone Encounter (Signed)
Handicap plackard has been placed in the mail. Nothing further needed at this time.

## 2018-02-05 ENCOUNTER — Encounter (HOSPITAL_COMMUNITY): Payer: Self-pay

## 2018-02-05 ENCOUNTER — Telehealth: Payer: Self-pay

## 2018-02-05 NOTE — Telephone Encounter (Signed)
Key: [id number]  Prior auth started today on Cover My Meds.

## 2018-02-06 NOTE — Telephone Encounter (Signed)
PA approved 11/09/2018-11/10/2018

## 2018-02-10 ENCOUNTER — Encounter (HOSPITAL_COMMUNITY)
Admission: RE | Admit: 2018-02-10 | Discharge: 2018-02-10 | Disposition: A | Discharge status: Home / Self Care | Payer: Self-pay | Source: Ambulatory Visit | Attending: Pulmonary Disease | Admitting: Pulmonary Disease

## 2018-02-10 DIAGNOSIS — G4730 Sleep apnea, unspecified: Secondary | ICD-10-CM | POA: Insufficient documentation

## 2018-02-10 DIAGNOSIS — K219 Gastro-esophageal reflux disease without esophagitis: Secondary | ICD-10-CM | POA: Insufficient documentation

## 2018-02-10 DIAGNOSIS — M810 Age-related osteoporosis without current pathological fracture: Secondary | ICD-10-CM | POA: Insufficient documentation

## 2018-02-10 DIAGNOSIS — J449 Chronic obstructive pulmonary disease, unspecified: Secondary | ICD-10-CM | POA: Insufficient documentation

## 2018-02-10 DIAGNOSIS — J45909 Unspecified asthma, uncomplicated: Secondary | ICD-10-CM | POA: Insufficient documentation

## 2018-02-10 DIAGNOSIS — Z5189 Encounter for other specified aftercare: Secondary | ICD-10-CM | POA: Insufficient documentation

## 2018-02-12 ENCOUNTER — Encounter (HOSPITAL_COMMUNITY)
Admission: RE | Admit: 2018-02-12 | Discharge: 2018-02-12 | Disposition: A | Discharge status: Home / Self Care | Payer: Medicare Other | Source: Ambulatory Visit | Attending: Internal Medicine | Admitting: Internal Medicine

## 2018-02-17 ENCOUNTER — Encounter (HOSPITAL_COMMUNITY): Payer: Self-pay

## 2018-02-18 ENCOUNTER — Telehealth: Payer: Self-pay

## 2018-02-18 NOTE — Telephone Encounter (Signed)
#   vials:1-150mg  prefilled syringe Ordered date:02/18/18  Shipping Date:02/19/18

## 2018-02-19 ENCOUNTER — Encounter (HOSPITAL_COMMUNITY): Payer: Self-pay

## 2018-02-19 NOTE — Telephone Encounter (Signed)
Prefilled Syringes: # 150mg  1  #75mg  0 Arrival Date: 4.11.19 Lot #: 150mg  [id number]      75mg  Exp Date: 150mg  12.31.19   75mg 

## 2018-02-23 ENCOUNTER — Ambulatory Visit (INDEPENDENT_AMBULATORY_CARE_PROVIDER_SITE_OTHER): Payer: Medicare Other | Admitting: Physician Assistant

## 2018-02-23 ENCOUNTER — Encounter: Payer: Self-pay | Admitting: Physician Assistant

## 2018-02-23 VITALS — BP 188/90 | HR 65 | Ht 64.5 in | Wt 170.1 lb

## 2018-02-23 DIAGNOSIS — I10 Essential (primary) hypertension: Secondary | ICD-10-CM

## 2018-02-23 DIAGNOSIS — I2584 Coronary atherosclerosis due to calcified coronary lesion: Secondary | ICD-10-CM | POA: Diagnosis not present

## 2018-02-23 DIAGNOSIS — J4540 Moderate persistent asthma, uncomplicated: Secondary | ICD-10-CM | POA: Diagnosis not present

## 2018-02-23 DIAGNOSIS — I2510 Atherosclerotic heart disease of native coronary artery without angina pectoris: Secondary | ICD-10-CM | POA: Diagnosis not present

## 2018-02-23 MED ORDER — HYDROCHLOROTHIAZIDE 25 MG PO TABS: 25.0000 mg | ORAL_TABLET | Freq: Every day | ORAL | 3 refills | Status: DC

## 2018-02-23 NOTE — Progress Notes (Signed)
Cardiology Office Note:    Date:  02/23/2018   ID:  [name] [name], DOB 12-06-1940, MRN [id number]  PCP:  [name] Koch, MD  Cardiologist:  [name] Mocha, MD  Pulmonologist: Dr. Chase [name]  Referring MD: [name] [name], *   Chief Complaint  Patient presents with  . Follow-up    coronary calcification; HTN    History of Present Illness:    [name] [name] is a 77 y.o. female with severe asthma, coronary artery calcification noted on prior Chest CT, OSA, HL, GERD, HTN.  Last seen in 10/2016.  A Nuclear stress test was arranged 2/2 chest pain.  However, this was never done.      [name] [name] returns for follow up.  She is here alone.  She denies any chest pain, syncope, edema.  She has chronic shortness of breath related to asthma/COPD.  This is unchanged.  She goes to pulmonary rehab.  She notes her BP is typically in the 140s.  Her PCP increased her Norvasc recently for uncontrolled BP.  However, Norvasc 10 mg QD makes her cough, so she resumed 5 mg QD.    Prior CV studies:   The following studies were reviewed today:  Holter 6/13 NSR, sinus brady, supraventricular ectopics, no sustained arrhythmias  Echo 4/12 Vigorous LVF, EF 65-70, no RWMA, Gr 1 DD  Past Medical History:  Diagnosis Date  . Allergic asthma    h/o  . ALLERGIC RHINITIS   . Anxiety   . Bronchiectasis    h/o  . Chronic obstructive asthma    PFT 11/06/10 - FEV1 1.24/ 0.62; FEV1/FVC 0.56, TLC 0.78; DLCO 0.75  . Chronic rhinosinusitis   . Colon polyp, hyperplastic 02/2003, 03/2014  . COPD (chronic obstructive pulmonary disease) ([place])   . Gastroparesis 2010  . GERD (gastroesophageal reflux disease)   . Helicobacter pylori gastritis 02/2009   partially treated  . Hiatal hernia   . Hyperlipidemia   . Hypertension   . Osteoporosis   . Sleep apnea    Surgical Hx: The patient  has a past surgical history that includes skin grafting (1969); Colonoscopy; Polypectomy; Appendectomy (1960); Cataract  extraction w/ intraocular lens  implant, bilateral (2012); Tubal ligation (1968); Tonsillectomy (1960); and Colonoscopy with propofol (N/A, 03/29/2014).   Current Medications: Current Meds  Medication Sig  . ALPRAZolam (XANAX) 0.25 MG tablet TAKE 1 TABLET EVERY DAY AS NEEDED FOR ANXIETY  . amLODipine (NORVASC) 5 MG tablet Take 1 tablet by mouth daily.  . budesonide-formoterol (SYMBICORT) 160-4.5 MCG/ACT inhaler Inhale 2 puffs into the lungs 2 (two) times daily.  . Cholecalciferol (VITAMIN D) 2000 units CAPS Take 1 capsule (2,000 Units total) by mouth daily.  Marland Kitchen desloratadine (CLARINEX) 5 MG tablet Take 1 tablet (5 mg total) by mouth daily.  Marland Kitchen EPINEPHrine (EPIPEN 2-PAK) 0.3 mg/0.3 mL IJ SOAJ injection Inject 0.3 mLs (0.3 mg total) into the muscle once.  . fluocinonide cream (LIDEX) 3.76 % Apply 1 application topically 2 (two) times daily.   Marland Kitchen NEXIUM 40 MG capsule TAKE 1 CAPSULE BY MOUTH DAILY AT 12 NOON**PA REQ**  . olopatadine (PATANOL) 0.1 % ophthalmic solution Place 1 drop into both eyes 2 (two) times daily. Appt with PCP needed for additional refills.  . RESTASIS 0.05 % ophthalmic emulsion PLACE 1 DROP INTO BOTH EYES 2 (TWO) TIMES DAILY.  Marland Kitchen SINGULAIR 10 MG tablet TAKE 1 TABLET BY MOUTH AT BEDTIME  . Tiotropium Bromide Monohydrate (SPIRIVA RESPIMAT) 2.5 MCG/ACT AERS Inhale 2 puffs into the lungs  daily.  Penne Lash 0.63 MG/3ML nebulizer solution INHALE 1 VIAL VIA NEBULIZER EVERY 6 HOURS AS NEEDED FOR WHEEZING/SHORTNESS OF BREATH (J45.50)  . XOPENEX HFA 45 MCG/ACT inhaler INHALE 2 PUFFS INTO THE LUNGS EVERY 4 HOURS AS NEEDED FOR WHEEZING OR SHORTNESS OF BREATH.   Current Facility-Administered Medications for the 02/23/18 encounter (Office Visit) with Richardson Dopp T, PA-C  Medication  . omalizumab Arvid Right) injection 150 mg     Allergies:   Influenza vaccines; Latex; Albuterol; Advair diskus [fluticasone-salmeterol]; Diclofenac sodium; Diclofenac sodium; Doxycycline; Dulera [mometasone  furo-formoterol fum]; Other; Penicillins; Sulfonamide derivatives; Tiotropium; and Tiotropium bromide monohydrate   Social History   Tobacco Use  . Smoking status: Former Smoker    Years: 1.00    Types: Cigarettes    Last attempt to quit: 11/11/1962    Years since quitting: 55.3  . Smokeless tobacco: Never Used  . Tobacco comment: socially  Substance Use Topics  . Alcohol use: No  . Drug use: No     Family Hx: The patient's family history includes Hypertension in her son; Stroke (age of onset: 19) in her son; Stroke (age of onset: 60) in her sister. There is no history of Colon cancer, Throat cancer, Pancreatic cancer, Diabetes, Heart disease, Kidney disease, or Liver disease.  ROS:   Please see the history of present illness.    Review of Systems  Cardiovascular: Positive for dyspnea on exertion.  Respiratory: Positive for wheezing.   Skin: Positive for rash.  Neurological: Positive for headaches.  Psychiatric/Behavioral: The patient is nervous/anxious.    All other systems reviewed and are negative.   EKGs/Labs/Other Test Reviewed:    EKG:  EKG is  ordered today.  The ekg ordered today demonstrates normal sinus rhythm, heart rate 65, normal axis, PACs, QTC 418, no change  Recent Labs: 01/19/2018: ALT 9; BUN 17; Creatinine, Ser 1.24; Hemoglobin 12.0; Platelets 209.0; Potassium 4.0; Sodium 141   Recent Lipid Panel Lab Results  Component Value Date/Time   CHOL 199 01/19/2018 02:08 PM   TRIG 141.0 01/19/2018 02:08 PM   HDL 46.90 01/19/2018 02:08 PM   CHOLHDL 4 01/19/2018 02:08 PM   LDLCALC 124 (H) 01/19/2018 02:08 PM   LDLDIRECT 123.2 11/21/2014 04:24 PM    Physical Exam:    VS:  BP (!) 188/90   Pulse 65   Ht 5' 4.5" (1.638 m)   Wt 170 lb 1.9 oz (77.2 kg)   BMI 28.75 kg/m     Wt Readings from Last 3 Encounters:  02/23/18 170 lb 1.9 oz (77.2 kg)  01/19/18 174 lb (78.9 kg)  01/16/18 171 lb (77.6 kg)     Physical Exam  Constitutional: She is oriented to  person, place, and time. She appears well-developed and well-nourished. No distress.  HENT:  Head: Normocephalic and atraumatic.  Neck: No JVD present.  Cardiovascular: Normal rate and regular rhythm.  No murmur heard. Pulmonary/Chest: Effort normal. She has no rales.  Abdominal: Soft.  Musculoskeletal: She exhibits no edema.  Neurological: She is alert and oriented to person, place, and time.  Skin: Skin is warm and dry.    ASSESSMENT & PLAN:    #1.  Essential hypertension  BP is uncontrolled.  She has had several readings that are above target.  She does not consume high salt foods.  She has previously been on HCTZ without adverse effects.  She cannot tolerate higher dose Norvasc.  I am hesitant to put her on an ACE inhibitor with her Asthma.  ARBs  are difficult to start currently with the recall.  Other options include Clonidine or Hydralazine.  I have opted for HCTZ.  -Start HCTZ 25 mg QD  -BMET in 2 weeks  -FU with me in 1 month  #2.  Coronary artery calcification She cannot tolerate ASA, statin rx.  She denies angina.  #3.  Moderate persistent asthma without complication   Continue follow up with pulmonology.  She continues to go to pulmonary rehab as well.    Dispo:  Return in about 1 month (around 03/23/2018) for Routine Follow Up, w/ Richardson Dopp, PA-C.   Medication Adjustments/Labs and Tests Ordered: Current medicines are reviewed at length with the patient today.  Concerns regarding medicines are outlined above.  Tests Ordered: Orders Placed This Encounter  Procedures  . Basic Metabolic Panel (BMET)  . EKG 12-Lead   Medication Changes: Meds ordered this encounter  Medications  . hydrochlorothiazide (HYDRODIURIL) 25 MG tablet    Sig: Take 1 tablet (25 mg total) by mouth daily.    Dispense:  90 tablet    Refill:  3    Signed, Richardson Dopp, PA-C  02/23/2018 3:46 PM    [facility] [place], [place], [place]  [address] Phone: [phone]; Fax: [phone]

## 2018-02-23 NOTE — Patient Instructions (Addendum)
Medication Instructions:  1. START HCTZ 25 MG 1 TABLET DAILY; RX HAS BEEN SENT IN  Labwork: 1. BMET TO BE DONE IN 2 WEEKS March 09, 2018; COME IN ANYTIME FROM 7:30-4:30 PM   Testing/Procedures: NONE ORDERED TODAY  Follow-Up: [place], [place] ON Mar 20, 2018 @ 11:45 AM   Any Other Special Instructions Will Be Listed Below (If Applicable). CHECK BLOOD PRESSURE SEVERAL TIMES PER WEEK AND BRING READINGS TO NEXT APPT.     If you need a refill on your cardiac medications before your next appointment, please call your pharmacy.

## 2018-02-24 ENCOUNTER — Encounter (HOSPITAL_COMMUNITY)
Admission: RE | Admit: 2018-02-24 | Discharge: 2018-02-24 | Disposition: A | Discharge status: Home / Self Care | Payer: Self-pay | Source: Ambulatory Visit | Attending: Internal Medicine | Admitting: Internal Medicine

## 2018-02-26 ENCOUNTER — Encounter (HOSPITAL_COMMUNITY)
Admission: RE | Admit: 2018-02-26 | Discharge: 2018-02-26 | Disposition: A | Discharge status: Home / Self Care | Payer: Self-pay | Source: Ambulatory Visit | Attending: Internal Medicine | Admitting: Internal Medicine

## 2018-03-02 ENCOUNTER — Telehealth: Payer: Self-pay | Admitting: Internal Medicine

## 2018-03-02 NOTE — Telephone Encounter (Signed)
Left message for patient to call back  

## 2018-03-02 NOTE — Telephone Encounter (Signed)
Patient stated that she received a jury summons in the mail and wants to know if MR would be willing to state that she can not attend jury duty due to her breathing issues. She stated that she will bring the forms by the office on Friday.   Nothing else needed at time of call.

## 2018-03-03 ENCOUNTER — Encounter (HOSPITAL_COMMUNITY)
Admission: RE | Admit: 2018-03-03 | Discharge: 2018-03-03 | Disposition: A | Discharge status: Home / Self Care | Payer: Medicare Other | Source: Ambulatory Visit | Attending: Internal Medicine | Admitting: Internal Medicine

## 2018-03-05 ENCOUNTER — Ambulatory Visit (INDEPENDENT_AMBULATORY_CARE_PROVIDER_SITE_OTHER): Payer: Medicare Other

## 2018-03-05 ENCOUNTER — Encounter (HOSPITAL_COMMUNITY): Payer: Self-pay

## 2018-03-05 DIAGNOSIS — J4550 Severe persistent asthma, uncomplicated: Secondary | ICD-10-CM

## 2018-03-06 ENCOUNTER — Telehealth: Payer: Self-pay | Admitting: Internal Medicine

## 2018-03-06 MED ORDER — OMALIZUMAB 150 MG ~~LOC~~ SOLR
150.0000 mg | SUBCUTANEOUS | Status: DC
  Administered 2018-03-05: 150 mg via SUBCUTANEOUS

## 2018-03-06 NOTE — Telephone Encounter (Signed)
Pt returning call. Pt advised that forms were ready for pick up. Cb is [phone].

## 2018-03-06 NOTE — Progress Notes (Signed)
Documentation of medication administration and charges of Xolair have been completed by Lindsay Lemons, CMA based on the Xolair documentation sheet completed by [name] Scott.  

## 2018-03-06 NOTE — Telephone Encounter (Signed)
I will let [name] know to place forms up front.

## 2018-03-06 NOTE — Telephone Encounter (Signed)
Jury duty summons notice has been filled out for pt.  Attempted to call pt letting her know it was ready for her to come pick up but no answer.  Left message for pt to return our call x1

## 2018-03-09 ENCOUNTER — Telehealth: Payer: Self-pay | Admitting: Internal Medicine

## 2018-03-09 ENCOUNTER — Other Ambulatory Visit: Payer: Medicare Other | Admitting: *Deleted

## 2018-03-09 DIAGNOSIS — I10 Essential (primary) hypertension: Secondary | ICD-10-CM

## 2018-03-09 NOTE — Telephone Encounter (Signed)
To Whom It May Concer   [name] [name] with date of birth 03-05-1941 and address [place] [address] has severe persistent allergic asthma with repeated flare ups including history of life threatening hospitalization. Jury duty is exactly the wrong thing for this patient because emotions are known to stress asthma and putting her at risk for fatal spiral out of control of her asthma. Additionally with her severe asthma there is no telling if she will be healthy enough to attend jury duty on those day(s). So, [name] [name] has to be medically excused from jury duty  Please do not hesitate to call the office at any time  Sincerely yours   Dr. Brand Males, M.D., [facility].C.P Pulmonary and Critical Care Medicine Staff Physician, [place] Director - Interstitial Lung Disease  Program  Pulmonary [place] at [place], [place], [address]   Telephone: [phone]

## 2018-03-09 NOTE — Telephone Encounter (Signed)
Letter has been typed for pt and is ready for pick up.  Called pt to let her know the letter was ready. Pt stated she will come by tomorrow to pick the letter up.  Letter placed up front for pt for her to pick up tomorrow at her convenience. Nothing further needed at this time.

## 2018-03-09 NOTE — Telephone Encounter (Signed)
Called and spoke with patient, she states that the letter needs to have MR name on it as well as the reason as to why she is exempt from the jury.    MR please advise on what the letter needs to say. Patient is needing this letter soon.

## 2018-03-10 ENCOUNTER — Encounter (HOSPITAL_COMMUNITY): Payer: Self-pay

## 2018-03-10 ENCOUNTER — Telehealth: Payer: Self-pay | Admitting: Physician Assistant

## 2018-03-10 LAB — BASIC METABOLIC PANEL
BUN/Creatinine Ratio: 12 (ref 12–28)
BUN: 14 mg/dL (ref 8–27)
CALCIUM: 9.2 mg/dL (ref 8.7–10.3)
CHLORIDE: 106 mmol/L (ref 96–106)
CO2: 22 mmol/L (ref 20–29)
Creatinine, Ser: 1.18 mg/dL — ABNORMAL HIGH (ref 0.57–1.00)
GFR calc Af Amer: 52 mL/min/{1.73_m2} — ABNORMAL LOW (ref >59)
GFR calc non Af Amer: 45 mL/min/{1.73_m2} — ABNORMAL LOW (ref >59)
Glucose: 102 mg/dL — ABNORMAL HIGH (ref 65–99)
POTASSIUM: 4.1 mmol/L (ref 3.5–5.2)
Sodium: 142 mmol/L (ref 134–144)

## 2018-03-10 NOTE — Telephone Encounter (Signed)
New Message ° ° °Pt returning call for nurse °

## 2018-03-10 NOTE — Telephone Encounter (Signed)
Pt called back for her results. When I called back I lmtcb x 2 to go over lab results.

## 2018-03-10 NOTE — Telephone Encounter (Signed)
-----   Message from Liliane Shi, [place] sent at 03/10/2018  2:05 PM EDT ----- Creatinine stable. The potassium is normal. Continue current medications and follow up as planned.  Richardson Dopp, PA-C    03/10/2018 2:04 PM

## 2018-03-10 NOTE — Telephone Encounter (Signed)
Pt has been notified of lab results by phone with verbal understanding. Pt thanked me for the call today.

## 2018-03-11 ENCOUNTER — Telehealth: Payer: Self-pay | Admitting: Internal Medicine

## 2018-03-11 NOTE — Telephone Encounter (Signed)
Prefilled Syringe: #150mg  1  #75mg  0 Ordered Date: 03/11/2018 Shipping Date: 03/11/2018

## 2018-03-12 ENCOUNTER — Encounter (HOSPITAL_COMMUNITY): Payer: Self-pay

## 2018-03-12 DIAGNOSIS — M810 Age-related osteoporosis without current pathological fracture: Secondary | ICD-10-CM | POA: Insufficient documentation

## 2018-03-12 DIAGNOSIS — J45909 Unspecified asthma, uncomplicated: Secondary | ICD-10-CM | POA: Insufficient documentation

## 2018-03-12 DIAGNOSIS — Z5189 Encounter for other specified aftercare: Secondary | ICD-10-CM | POA: Insufficient documentation

## 2018-03-12 DIAGNOSIS — G4730 Sleep apnea, unspecified: Secondary | ICD-10-CM | POA: Insufficient documentation

## 2018-03-12 DIAGNOSIS — J449 Chronic obstructive pulmonary disease, unspecified: Secondary | ICD-10-CM | POA: Insufficient documentation

## 2018-03-12 DIAGNOSIS — K219 Gastro-esophageal reflux disease without esophagitis: Secondary | ICD-10-CM | POA: Insufficient documentation

## 2018-03-12 NOTE — Telephone Encounter (Signed)
Prefilled Syringes: # 150mg  1  #75mg  0 Arrival Date: 03/12/2018 Lot #: 150mg  [id number]      75mg  0 Exp Date: 150mg  10/2018   75mg  0

## 2018-03-17 ENCOUNTER — Encounter (HOSPITAL_COMMUNITY): Payer: Self-pay

## 2018-03-19 ENCOUNTER — Encounter (HOSPITAL_COMMUNITY): Payer: Self-pay

## 2018-03-20 ENCOUNTER — Ambulatory Visit: Payer: Medicare Other | Admitting: Physician Assistant

## 2018-03-22 ENCOUNTER — Other Ambulatory Visit: Payer: Self-pay | Admitting: Internal Medicine

## 2018-03-24 ENCOUNTER — Encounter (HOSPITAL_COMMUNITY)
Admission: RE | Admit: 2018-03-24 | Discharge: 2018-03-24 | Disposition: A | Discharge status: Home / Self Care | Payer: Self-pay | Source: Ambulatory Visit | Attending: Pulmonary Disease | Admitting: Pulmonary Disease

## 2018-03-26 ENCOUNTER — Encounter (HOSPITAL_COMMUNITY)
Admission: RE | Admit: 2018-03-26 | Discharge: 2018-03-26 | Disposition: A | Discharge status: Home / Self Care | Payer: Medicare Other | Source: Ambulatory Visit | Attending: Internal Medicine | Admitting: Internal Medicine

## 2018-03-31 ENCOUNTER — Encounter (HOSPITAL_COMMUNITY)
Admission: RE | Admit: 2018-03-31 | Discharge: 2018-03-31 | Disposition: A | Discharge status: Home / Self Care | Payer: Self-pay | Source: Ambulatory Visit | Attending: Internal Medicine | Admitting: Internal Medicine

## 2018-03-31 ENCOUNTER — Other Ambulatory Visit: Payer: Self-pay | Admitting: Internal Medicine

## 2018-04-02 ENCOUNTER — Ambulatory Visit (INDEPENDENT_AMBULATORY_CARE_PROVIDER_SITE_OTHER): Payer: Medicare Other

## 2018-04-02 ENCOUNTER — Encounter (HOSPITAL_COMMUNITY)
Admission: RE | Admit: 2018-04-02 | Discharge: 2018-04-02 | Disposition: A | Discharge status: Home / Self Care | Payer: Self-pay | Source: Ambulatory Visit | Attending: Internal Medicine | Admitting: Internal Medicine

## 2018-04-02 DIAGNOSIS — J4550 Severe persistent asthma, uncomplicated: Secondary | ICD-10-CM

## 2018-04-03 MED ORDER — OMALIZUMAB 150 MG ~~LOC~~ SOLR
150.0000 mg | SUBCUTANEOUS | Status: DC
  Administered 2018-04-02: 150 mg via SUBCUTANEOUS

## 2018-04-03 NOTE — Progress Notes (Signed)
Documentation of medication administration and charges of Xolair have been completed by Wen Munford, CMA based on the Xolair documentation sheet completed by [name] Scott.  

## 2018-04-07 ENCOUNTER — Encounter (HOSPITAL_COMMUNITY): Payer: Self-pay

## 2018-04-09 ENCOUNTER — Encounter (HOSPITAL_COMMUNITY)
Admission: RE | Admit: 2018-04-09 | Discharge: 2018-04-09 | Disposition: A | Discharge status: Home / Self Care | Payer: Medicare Other | Source: Ambulatory Visit | Attending: Internal Medicine | Admitting: Internal Medicine

## 2018-04-14 ENCOUNTER — Encounter (HOSPITAL_COMMUNITY)
Admission: RE | Admit: 2018-04-14 | Discharge: 2018-04-14 | Disposition: A | Discharge status: Home / Self Care | Payer: Self-pay | Source: Ambulatory Visit | Attending: Pulmonary Disease | Admitting: Pulmonary Disease

## 2018-04-14 DIAGNOSIS — J45909 Unspecified asthma, uncomplicated: Secondary | ICD-10-CM | POA: Insufficient documentation

## 2018-04-14 DIAGNOSIS — J449 Chronic obstructive pulmonary disease, unspecified: Secondary | ICD-10-CM | POA: Insufficient documentation

## 2018-04-14 DIAGNOSIS — G4730 Sleep apnea, unspecified: Secondary | ICD-10-CM | POA: Insufficient documentation

## 2018-04-14 DIAGNOSIS — Z5189 Encounter for other specified aftercare: Secondary | ICD-10-CM | POA: Insufficient documentation

## 2018-04-14 DIAGNOSIS — M810 Age-related osteoporosis without current pathological fracture: Secondary | ICD-10-CM | POA: Insufficient documentation

## 2018-04-14 DIAGNOSIS — K219 Gastro-esophageal reflux disease without esophagitis: Secondary | ICD-10-CM | POA: Insufficient documentation

## 2018-04-16 ENCOUNTER — Encounter (HOSPITAL_COMMUNITY): Payer: Self-pay

## 2018-04-17 ENCOUNTER — Ambulatory Visit: Payer: Medicare Other | Admitting: Cardiovascular Disease

## 2018-04-20 ENCOUNTER — Encounter: Payer: Self-pay | Admitting: Physician Assistant

## 2018-04-20 ENCOUNTER — Ambulatory Visit (INDEPENDENT_AMBULATORY_CARE_PROVIDER_SITE_OTHER): Payer: Medicare Other | Admitting: Physician Assistant

## 2018-04-20 VITALS — BP 144/92 | HR 69 | Ht 63.6 in | Wt 170.1 lb

## 2018-04-20 DIAGNOSIS — I10 Essential (primary) hypertension: Secondary | ICD-10-CM | POA: Diagnosis not present

## 2018-04-20 DIAGNOSIS — I2510 Atherosclerotic heart disease of native coronary artery without angina pectoris: Secondary | ICD-10-CM

## 2018-04-20 DIAGNOSIS — I2584 Coronary atherosclerosis due to calcified coronary lesion: Secondary | ICD-10-CM | POA: Diagnosis not present

## 2018-04-20 NOTE — Patient Instructions (Signed)
Medication Instructions:  1. Your physician recommends that you continue on your current medications as directed. Please refer to the Current Medication list given to you today.   Labwork: NONE ORDERED TODAY  Testing/Procedures: NONE ORDERED TODAY  Follow-Up: Your physician wants you to follow-up in: [place], PAC You will receive a reminder letter in the mail two months in advance. If you don't receive a letter, please call our office to schedule the follow-up appointment.   Any Other Special Instructions Will Be Listed Below (If Applicable).  KEEP AN EYE ON YOUR BLOOD PRESSURE AND IF CONSISTENTLY RUNNING 130/80 OR HIGHER PLEASE CALL THE OFFICE [phone] TO LET [facility], [facility]   If you need a refill on your cardiac medications before your next appointment, please call your pharmacy.

## 2018-04-20 NOTE — Progress Notes (Signed)
Cardiology Office Note:    Date:  04/20/2018   ID:  [name] [name], DOB 1941-05-18, MRN [id number]  PCP:  Hoyt Koch, MD  Cardiologist:  Sherren Mocha, MD   Referring MD: Hoyt Koch, *   Chief Complaint  Patient presents with  . Follow-up    HTN    History of Present Illness:    [name] [name] is a 77 y.o. female with severe asthma, coronary artery calcification noted on prior Chest CT, OSA, HL, GERD, HTN.  last seen 02/23/18.  Her medications were adjusted for uncontrolled blood pressure.   [name] [name] returns for follow-up.  She stopped taking hydrochlorothiazide.  This caused worsening issues with stress incontinence as well as leg cramping.  She denies chest pain.  She continues to go to pulmonary rehab.  Her breathing is overall stable.  She denies significant leg swelling.  Prior CV studies:   The following studies were reviewed today:  Holter 6/13 NSR, sinus brady, supraventricular ectopics, no sustained arrhythmias  Echo 4/12 Vigorous LVF, EF 65-70, no RWMA, Gr 1 DD  Past Medical History:  Diagnosis Date  . Allergic asthma    h/o  . ALLERGIC RHINITIS   . Anxiety   . Bronchiectasis    h/o  . Chronic obstructive asthma    PFT 11/06/10 - FEV1 1.24/ 0.62; FEV1/FVC 0.56, TLC 0.78; DLCO 0.75  . Chronic rhinosinusitis   . Colon polyp, hyperplastic 02/2003, 03/2014  . COPD (chronic obstructive pulmonary disease) ([place])   . Gastroparesis 2010  . GERD (gastroesophageal reflux disease)   . Helicobacter pylori gastritis 02/2009   partially treated  . Hiatal hernia   . Hyperlipidemia   . Hypertension   . Osteoporosis   . Sleep apnea    Surgical Hx: The patient  has a past surgical history that includes skin grafting (1969); Colonoscopy; Polypectomy; Appendectomy (1960); Cataract extraction w/ intraocular lens  implant, bilateral (2012); Tubal ligation (1968); Tonsillectomy (1960); and Colonoscopy with propofol (N/A, 03/29/2014).   Current  Medications: Current Meds  Medication Sig  . ALPRAZolam (XANAX) 0.25 MG tablet TAKE 1 TABLET EVERY DAY AS NEEDED FOR ANXIETY  . amLODipine (NORVASC) 5 MG tablet Take 1 tablet by mouth daily.  . Cholecalciferol (VITAMIN D) 2000 units CAPS Take 1 capsule (2,000 Units total) by mouth daily.  Marland Kitchen desloratadine (CLARINEX) 5 MG tablet Take 1 tablet (5 mg total) by mouth daily.  Marland Kitchen EPINEPHrine (EPIPEN 2-PAK) 0.3 mg/0.3 mL IJ SOAJ injection Inject 0.3 mLs (0.3 mg total) into the muscle once.  . fluocinonide cream (LIDEX) 9.32 % Apply 1 application topically 2 (two) times daily.   Marland Kitchen NEXIUM 40 MG capsule Take 1 capsule (40 mg total) by mouth daily at 12 noon.  Marland Kitchen olopatadine (PATANOL) 0.1 % ophthalmic solution Place 1 drop into both eyes 2 (two) times daily. Appt with PCP needed for additional refills.  . RESTASIS 0.05 % ophthalmic emulsion PLACE 1 DROP INTO BOTH EYES 2 (TWO) TIMES DAILY.  Marland Kitchen SINGULAIR 10 MG tablet TAKE 1 TABLET BY MOUTH AT BEDTIME  . SYMBICORT 160-4.5 MCG/ACT inhaler TAKE 2 PUFFS BY MOUTH TWICE A DAY  . Tiotropium Bromide Monohydrate (SPIRIVA RESPIMAT) 2.5 MCG/ACT AERS Inhale 2 puffs into the lungs daily.  Penne Lash 0.63 MG/3ML nebulizer solution INHALE 1 VIAL VIA NEBULIZER EVERY 6 HOURS AS NEEDED FOR WHEEZING/SHORTNESS OF BREATH (J45.50)  . XOPENEX HFA 45 MCG/ACT inhaler INHALE 2 PUFFS INTO THE LUNGS EVERY 4 HOURS AS NEEDED FOR WHEEZING OR SHORTNESS  OF BREATH.   Current Facility-Administered Medications for the 04/20/18 encounter (Office Visit) with Richardson Dopp T, PA-C  Medication  . omalizumab Arvid Right) injection 150 mg  . omalizumab Arvid Right) injection 150 mg  . omalizumab Arvid Right) injection 150 mg     Allergies:   Influenza vaccines; Latex; Albuterol; Advair diskus [fluticasone-salmeterol]; Diclofenac sodium; Diclofenac sodium; Doxycycline; Dulera [mometasone furo-formoterol fum]; Other; Penicillins; Sulfonamide derivatives; Tiotropium; and Tiotropium bromide monohydrate   Social  History   Tobacco Use  . Smoking status: Former Smoker    Years: 1.00    Types: Cigarettes    Last attempt to quit: 11/11/1962    Years since quitting: 55.4  . Smokeless tobacco: Never Used  . Tobacco comment: socially  Substance Use Topics  . Alcohol use: No  . Drug use: No     Family Hx: The patient's family history includes Hypertension in her son; Stroke (age of onset: 55) in her son; Stroke (age of onset: 37) in her sister. There is no history of Colon cancer, Throat cancer, Pancreatic cancer, Diabetes, Heart disease, Kidney disease, or Liver disease.  ROS:   Please see the history of present illness.    ROS All other systems reviewed and are negative.   EKGs/Labs/Other Test Reviewed:    EKG:  EKG is not ordered today.    Recent Labs: 01/19/2018: ALT 9; Hemoglobin 12.0; Platelets 209.0 03/09/2018: BUN 14; Creatinine, Ser 1.18; Potassium 4.1; Sodium 142   Recent Lipid Panel Lab Results  Component Value Date/Time   CHOL 199 01/19/2018 02:08 PM   TRIG 141.0 01/19/2018 02:08 PM   HDL 46.90 01/19/2018 02:08 PM   CHOLHDL 4 01/19/2018 02:08 PM   LDLCALC 124 (H) 01/19/2018 02:08 PM   LDLDIRECT 123.2 11/21/2014 04:24 PM    Physical Exam:    VS:  BP (!) 144/92   Pulse 69   Ht 5' 3.6" (1.615 m)   Wt 170 lb 1.9 oz (77.2 kg)   SpO2 97%   BMI 29.57 kg/m     Wt Readings from Last 3 Encounters:  04/20/18 170 lb 1.9 oz (77.2 kg)  02/23/18 170 lb 1.9 oz (77.2 kg)  01/19/18 174 lb (78.9 kg)     Physical Exam  Constitutional: She is oriented to person, place, and time. She appears well-developed and well-nourished. No distress.  HENT:  Head: Normocephalic and atraumatic.  Neck: Neck supple.  Cardiovascular: Normal rate, regular rhythm, S1 normal, S2 normal and normal heart sounds.  No murmur heard. Pulmonary/Chest: Effort normal. She has no rales.  Abdominal: Soft.  Musculoskeletal: She exhibits no edema.  Neurological: She is alert and oriented to person, place, and  time.  Skin: Skin is warm and dry.    ASSESSMENT & PLAN:    Essential hypertension She has intolerances to multiple medications.  Blood pressure typically is elevated in the office.  Her blood pressure readings at home have been optimal (120s/80s-130s/80s).  Continue current therapy.  She will continue to monitor blood pressure and let us know if it runs above target.  Coronary artery calcification She cannot tolerate aspirin or statin therapy.  She denies anginal symptoms.   Dispo:  Return in about 1 year (around 04/21/2019) for Routine Follow Up, w/ Dr. Burt Knack, or Richardson Dopp, PA-C.   Medication Adjustments/Labs and Tests Ordered: Current medicines are reviewed at length with the patient today.  Concerns regarding medicines are outlined above.  Tests Ordered: No orders of the defined types were placed in this encounter.  Medication Changes:  No orders of the defined types were placed in this encounter.   Signed, Richardson Dopp, PA-C  04/20/2018 2:35 PM    [facility] [place], [place], [place]  [address] Phone: [phone]; Fax: [phone]

## 2018-04-21 ENCOUNTER — Encounter (HOSPITAL_COMMUNITY)
Admission: RE | Admit: 2018-04-21 | Discharge: 2018-04-21 | Disposition: A | Discharge status: Home / Self Care | Payer: Self-pay | Source: Ambulatory Visit | Attending: Internal Medicine | Admitting: Internal Medicine

## 2018-04-22 ENCOUNTER — Telehealth: Payer: Self-pay | Admitting: Internal Medicine

## 2018-04-22 ENCOUNTER — Ambulatory Visit: Payer: Medicare Other | Admitting: Internal Medicine

## 2018-04-22 NOTE — Telephone Encounter (Signed)
Prefilled Syringe: #150mg  1  #75mg  0 Ordered Date: 04/22/18 Shipping Date: 04/22/18

## 2018-04-23 ENCOUNTER — Encounter (HOSPITAL_COMMUNITY): Payer: Self-pay

## 2018-04-24 NOTE — Telephone Encounter (Signed)
Prefilled Syringes: # 150mg  1  #75mg  0 Arrival Date: 04/23/18 Lot #: 150mg  [id number]      75mg  0 Exp Date: 150mg  09/2019   75mg  0

## 2018-04-28 ENCOUNTER — Encounter (HOSPITAL_COMMUNITY)
Admission: RE | Admit: 2018-04-28 | Discharge: 2018-04-28 | Disposition: A | Discharge status: Home / Self Care | Payer: Self-pay | Source: Ambulatory Visit | Attending: Internal Medicine | Admitting: Internal Medicine

## 2018-04-30 ENCOUNTER — Encounter (HOSPITAL_COMMUNITY): Payer: Self-pay

## 2018-05-01 ENCOUNTER — Ambulatory Visit (INDEPENDENT_AMBULATORY_CARE_PROVIDER_SITE_OTHER): Payer: Medicare Other

## 2018-05-01 DIAGNOSIS — J4550 Severe persistent asthma, uncomplicated: Secondary | ICD-10-CM

## 2018-05-04 MED ORDER — OMALIZUMAB 150 MG ~~LOC~~ SOLR
150.0000 mg | Freq: Once | SUBCUTANEOUS | Status: AC
  Administered 2018-05-01: 150 mg via SUBCUTANEOUS

## 2018-05-05 ENCOUNTER — Encounter (HOSPITAL_COMMUNITY)
Admission: RE | Admit: 2018-05-05 | Discharge: 2018-05-05 | Disposition: A | Discharge status: Home / Self Care | Payer: Medicare Other | Source: Ambulatory Visit | Attending: Internal Medicine | Admitting: Internal Medicine

## 2018-05-07 ENCOUNTER — Encounter: Payer: Self-pay | Admitting: Internal Medicine

## 2018-05-07 ENCOUNTER — Encounter (HOSPITAL_COMMUNITY)
Admission: RE | Admit: 2018-05-07 | Discharge: 2018-05-07 | Disposition: A | Discharge status: Home / Self Care | Payer: Self-pay | Source: Ambulatory Visit | Attending: Internal Medicine | Admitting: Internal Medicine

## 2018-05-08 ENCOUNTER — Other Ambulatory Visit: Payer: Self-pay | Admitting: Internal Medicine

## 2018-05-08 ENCOUNTER — Telehealth: Payer: Self-pay | Admitting: Internal Medicine

## 2018-05-08 DIAGNOSIS — R921 Mammographic calcification found on diagnostic imaging of breast: Secondary | ICD-10-CM

## 2018-05-08 NOTE — Telephone Encounter (Signed)
Copied from [id number]. Topic: Quick Communication - See Telephone Encounter >> May 08, 2018  1:04 PM Hewitt Shorts wrote: Pt is needing a refill on her epi pen   [facility] [address]   Best number [phone]

## 2018-05-08 NOTE — Telephone Encounter (Signed)
Epi pen refill Last OV:02/23/18 Last refill:2017 VXB:[id number] Pharmacy: [id number] [address], [place]. [phone] (Phone) [phone] (Fax)

## 2018-05-11 ENCOUNTER — Other Ambulatory Visit: Payer: Self-pay | Admitting: Family

## 2018-05-11 NOTE — Telephone Encounter (Signed)
Not a patient at this office

## 2018-05-12 ENCOUNTER — Encounter (HOSPITAL_COMMUNITY): Payer: Self-pay

## 2018-05-12 DIAGNOSIS — J449 Chronic obstructive pulmonary disease, unspecified: Secondary | ICD-10-CM | POA: Insufficient documentation

## 2018-05-12 DIAGNOSIS — K219 Gastro-esophageal reflux disease without esophagitis: Secondary | ICD-10-CM | POA: Insufficient documentation

## 2018-05-12 DIAGNOSIS — G4730 Sleep apnea, unspecified: Secondary | ICD-10-CM | POA: Insufficient documentation

## 2018-05-12 DIAGNOSIS — Z5189 Encounter for other specified aftercare: Secondary | ICD-10-CM | POA: Insufficient documentation

## 2018-05-12 DIAGNOSIS — M810 Age-related osteoporosis without current pathological fracture: Secondary | ICD-10-CM | POA: Insufficient documentation

## 2018-05-12 DIAGNOSIS — J45909 Unspecified asthma, uncomplicated: Secondary | ICD-10-CM | POA: Insufficient documentation

## 2018-05-12 MED ORDER — EPINEPHRINE 0.3 MG/0.3ML IJ SOAJ: 0.3000 mg | Freq: Once | INTRAMUSCULAR | 0 refills | Status: DC

## 2018-05-12 NOTE — Addendum Note (Signed)
Addended by: Earnstine Regal on: 05/12/2018 01:13 PM   Modules accepted: Orders

## 2018-05-12 NOTE — Telephone Encounter (Signed)
Reviewed chart pt is up-to-date sent refills to [facility]  

## 2018-05-12 NOTE — Telephone Encounter (Signed)
Re-sending request.  Original request was sent to the wrong office.  Please refill Rx

## 2018-05-18 ENCOUNTER — Other Ambulatory Visit: Payer: Self-pay | Admitting: Internal Medicine

## 2018-05-19 ENCOUNTER — Encounter (HOSPITAL_COMMUNITY): Payer: Self-pay

## 2018-05-19 ENCOUNTER — Telehealth: Payer: Self-pay | Admitting: Internal Medicine

## 2018-05-19 MED ORDER — XOPENEX 0.63 MG/3ML IN NEBU: 0.6300 mg | INHALATION_SOLUTION | Freq: Four times a day (QID) | RESPIRATORY_TRACT | 5 refills | Status: DC | PRN

## 2018-05-19 NOTE — Telephone Encounter (Signed)
Rx sent to pt's preferred pharmacy.    Called pt but unable to reach her.  Left a detailed message for pt letting her know this was done. Nothing further needed.

## 2018-05-21 ENCOUNTER — Encounter (HOSPITAL_COMMUNITY)
Admission: RE | Admit: 2018-05-21 | Discharge: 2018-05-21 | Disposition: A | Discharge status: Home / Self Care | Payer: Self-pay | Source: Ambulatory Visit | Attending: Pulmonary Disease | Admitting: Pulmonary Disease

## 2018-05-22 ENCOUNTER — Telehealth: Payer: Self-pay | Admitting: Internal Medicine

## 2018-05-22 NOTE — Telephone Encounter (Signed)
Prefilled Syringe: #150mg  1  #75mg  0 Ordered Date: 05/22/18 Shipping Date: 05/24/18

## 2018-05-23 ENCOUNTER — Other Ambulatory Visit: Payer: Self-pay | Admitting: Internal Medicine

## 2018-05-25 NOTE — Telephone Encounter (Signed)
Prefilled Syringes: # 150mg  4   Arrival Date: 05/25/2018 Lot #: 150mg  1       Exp Date: 150mg  [id number]

## 2018-05-26 ENCOUNTER — Encounter (HOSPITAL_COMMUNITY)
Admission: RE | Admit: 2018-05-26 | Discharge: 2018-05-26 | Disposition: A | Discharge status: Home / Self Care | Payer: Self-pay | Source: Ambulatory Visit | Attending: Internal Medicine | Admitting: Internal Medicine

## 2018-05-28 ENCOUNTER — Encounter (HOSPITAL_COMMUNITY): Payer: Self-pay

## 2018-05-29 ENCOUNTER — Ambulatory Visit: Payer: Medicare Other

## 2018-06-01 ENCOUNTER — Other Ambulatory Visit (INDEPENDENT_AMBULATORY_CARE_PROVIDER_SITE_OTHER): Payer: Medicare Other

## 2018-06-01 ENCOUNTER — Ambulatory Visit (INDEPENDENT_AMBULATORY_CARE_PROVIDER_SITE_OTHER): Payer: Medicare Other

## 2018-06-01 ENCOUNTER — Encounter: Payer: Self-pay | Admitting: Internal Medicine

## 2018-06-01 ENCOUNTER — Ambulatory Visit (INDEPENDENT_AMBULATORY_CARE_PROVIDER_SITE_OTHER): Payer: Medicare Other | Admitting: Internal Medicine

## 2018-06-01 VITALS — BP 160/94 | HR 70 | Ht 63.5 in | Wt 174.2 lb

## 2018-06-01 DIAGNOSIS — D721 Eosinophilia, unspecified: Secondary | ICD-10-CM

## 2018-06-01 DIAGNOSIS — J4550 Severe persistent asthma, uncomplicated: Secondary | ICD-10-CM

## 2018-06-01 DIAGNOSIS — I2510 Atherosclerotic heart disease of native coronary artery without angina pectoris: Secondary | ICD-10-CM | POA: Diagnosis not present

## 2018-06-01 DIAGNOSIS — I2584 Coronary atherosclerosis due to calcified coronary lesion: Secondary | ICD-10-CM | POA: Diagnosis not present

## 2018-06-01 LAB — CBC WITH DIFFERENTIAL/PLATELET
BASOS ABS: 0.1 10*3/uL (ref 0.0–0.1)
BASOS PCT: 1.1 % (ref 0.0–3.0)
EOS PCT: 9.2 % — AB (ref 0.0–5.0)
Eosinophils Absolute: 0.6 10*3/uL (ref 0.0–0.7)
HCT: 37.7 % (ref 36.0–46.0)
Hemoglobin: 12.1 g/dL (ref 12.0–15.0)
LYMPHS ABS: 1.9 10*3/uL (ref 0.7–4.0)
Lymphocytes Relative: 29 % (ref 12.0–46.0)
MCHC: 32.2 g/dL (ref 30.0–36.0)
MCV: 86.5 fl (ref 78.0–100.0)
MONOS PCT: 10.6 % (ref 3.0–12.0)
Monocytes Absolute: 0.7 10*3/uL (ref 0.1–1.0)
NEUTROS ABS: 3.3 10*3/uL (ref 1.4–7.7)
Neutrophils Relative %: 50.1 % (ref 43.0–77.0)
PLATELETS: 225 10*3/uL (ref 150.0–400.0)
RBC: 4.36 Mil/uL (ref 3.87–5.11)
RDW: 13.5 % (ref 11.5–15.5)
WBC: 6.6 10*3/uL (ref 4.0–10.5)

## 2018-06-01 LAB — NITRIC OXIDE: Nitric Oxide: 63

## 2018-06-01 MED ORDER — BUDESONIDE-FORMOTEROL FUMARATE 160-4.5 MCG/ACT IN AERO: 2.0000 | INHALATION_SPRAY | Freq: Two times a day (BID) | RESPIRATORY_TRACT | 0 refills | Status: DC
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
## 2018-06-01 NOTE — Patient Instructions (Addendum)
ICD-10-CM   1. Severe persistent asthma without complication A27.73   2. 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

## 2018-06-02 ENCOUNTER — Encounter (HOSPITAL_COMMUNITY)
Admission: RE | Admit: 2018-06-02 | Discharge: 2018-06-02 | Disposition: A | Discharge status: Home / Self Care | Payer: Self-pay | Source: Ambulatory Visit | Attending: Internal Medicine | Admitting: Internal Medicine

## 2018-06-02 MED ORDER — OMALIZUMAB 150 MG ~~LOC~~ SOLR
150.0000 mg | Freq: Once | SUBCUTANEOUS | Status: AC
  Administered 2018-06-01: 150 mg via SUBCUTANEOUS

## 2018-06-04 ENCOUNTER — Encounter (HOSPITAL_COMMUNITY): Payer: Self-pay

## 2018-06-05 ENCOUNTER — Ambulatory Visit
Admission: RE | Admit: 2018-06-05 | Discharge: 2018-06-05 | Disposition: A | Discharge status: Home / Self Care | Payer: Medicare Other | Source: Ambulatory Visit | Attending: Internal Medicine | Admitting: Internal Medicine

## 2018-06-05 DIAGNOSIS — R921 Mammographic calcification found on diagnostic imaging of breast: Secondary | ICD-10-CM

## 2018-06-09 ENCOUNTER — Encounter (HOSPITAL_COMMUNITY)
Admission: RE | Admit: 2018-06-09 | Discharge: 2018-06-09 | Disposition: A | Discharge status: Home / Self Care | Payer: Self-pay | Source: Ambulatory Visit | Attending: Internal Medicine | Admitting: Internal Medicine

## 2018-06-11 ENCOUNTER — Encounter (HOSPITAL_COMMUNITY)
Admission: RE | Admit: 2018-06-11 | Discharge: 2018-06-11 | Disposition: A | Discharge status: Home / Self Care | Payer: Self-pay | Source: Ambulatory Visit | Attending: Pulmonary Disease | Admitting: Pulmonary Disease

## 2018-06-11 DIAGNOSIS — Z5189 Encounter for other specified aftercare: Secondary | ICD-10-CM | POA: Insufficient documentation

## 2018-06-11 DIAGNOSIS — G4730 Sleep apnea, unspecified: Secondary | ICD-10-CM | POA: Insufficient documentation

## 2018-06-11 DIAGNOSIS — J449 Chronic obstructive pulmonary disease, unspecified: Secondary | ICD-10-CM | POA: Insufficient documentation

## 2018-06-11 DIAGNOSIS — M810 Age-related osteoporosis without current pathological fracture: Secondary | ICD-10-CM | POA: Insufficient documentation

## 2018-06-11 DIAGNOSIS — J45909 Unspecified asthma, uncomplicated: Secondary | ICD-10-CM | POA: Insufficient documentation

## 2018-06-11 DIAGNOSIS — K219 Gastro-esophageal reflux disease without esophagitis: Secondary | ICD-10-CM | POA: Insufficient documentation

## 2018-06-16 ENCOUNTER — Encounter (HOSPITAL_COMMUNITY): Payer: Self-pay

## 2018-06-18 ENCOUNTER — Encounter (HOSPITAL_COMMUNITY): Payer: Self-pay

## 2018-06-23 ENCOUNTER — Encounter (HOSPITAL_COMMUNITY)
Admission: RE | Admit: 2018-06-23 | Discharge: 2018-06-23 | Disposition: A | Discharge status: Home / Self Care | Payer: Self-pay | Source: Ambulatory Visit | Attending: Internal Medicine | Admitting: Internal Medicine

## 2018-06-24 ENCOUNTER — Telehealth: Payer: Self-pay | Admitting: Internal Medicine

## 2018-06-24 NOTE — Telephone Encounter (Signed)
Prefilled Syringe: #150mg  1  #75mg  0 Ordered Date: 06/24/18 Shipping Date: 06/24/18

## 2018-06-25 ENCOUNTER — Encounter (HOSPITAL_COMMUNITY): Payer: Self-pay

## 2018-06-25 NOTE — Telephone Encounter (Signed)
Prefilled Syringes: # 150mg  1  #75mg  0 Arrival Date: 06/25/18 Lot #: 150mg  [id number]      75mg  0 Exp Date: 150mg  12/2018   75mg  0

## 2018-06-29 ENCOUNTER — Ambulatory Visit (INDEPENDENT_AMBULATORY_CARE_PROVIDER_SITE_OTHER): Payer: Medicare Other

## 2018-06-29 DIAGNOSIS — J4550 Severe persistent asthma, uncomplicated: Secondary | ICD-10-CM | POA: Diagnosis not present

## 2018-06-29 MED ORDER — OMALIZUMAB 150 MG ~~LOC~~ SOLR
150.0000 mg | Freq: Every day | SUBCUTANEOUS | Status: DC
  Administered 2018-06-29 – 2018-10-01 (×2): 150 mg via SUBCUTANEOUS

## 2018-06-30 ENCOUNTER — Encounter (HOSPITAL_COMMUNITY)
Admission: RE | Admit: 2018-06-30 | Discharge: 2018-06-30 | Disposition: A | Discharge status: Home / Self Care | Payer: Self-pay | Source: Ambulatory Visit | Attending: Internal Medicine | Admitting: Internal Medicine

## 2018-07-02 ENCOUNTER — Encounter (HOSPITAL_COMMUNITY)
Admission: RE | Admit: 2018-07-02 | Discharge: 2018-07-02 | Disposition: A | Discharge status: Home / Self Care | Payer: Self-pay | Source: Ambulatory Visit | Attending: Internal Medicine | Admitting: Internal Medicine

## 2018-07-07 ENCOUNTER — Encounter (HOSPITAL_COMMUNITY)
Admission: RE | Admit: 2018-07-07 | Discharge: 2018-07-07 | Disposition: A | Discharge status: Home / Self Care | Payer: Medicare Other | Source: Ambulatory Visit | Attending: Internal Medicine | Admitting: Internal Medicine

## 2018-07-09 ENCOUNTER — Encounter (HOSPITAL_COMMUNITY): Payer: Self-pay

## 2018-07-09 ENCOUNTER — Telehealth: Payer: Self-pay | Admitting: Internal Medicine

## 2018-07-10 NOTE — Telephone Encounter (Signed)
LMTCB x 1 

## 2018-07-10 NOTE — Telephone Encounter (Signed)
Called pt had to lmom. Since it is after 5:00 I told her I would call back in 15 or 20 mins.. If not I asked her to call Tues..(07/14/18)

## 2018-07-10 NOTE — Telephone Encounter (Signed)
Patient returning call- she can be reached at home [phone] or on her cell [phone]

## 2018-07-10 NOTE — Telephone Encounter (Signed)
Spoke with patient, patient wanted to make [place] aware that her Xolair injection was approved via her insurance. She states the coverage will not start until December and wanted to know what happens moving forward. Informed patient I would route this information to [place] for follow up regarding her injection.

## 2018-07-14 NOTE — Telephone Encounter (Signed)
I didn't get a chance to call pt back 07/10/18. I called and lmom again today. Pt just called back and said the reason she can't start on [place] until December is b/c her prescription plan will be with [facility]. Pt has been approved. Per MR we will cont. Xolair until then. Nothing further needed.

## 2018-07-22 ENCOUNTER — Other Ambulatory Visit: Payer: Self-pay | Admitting: Internal Medicine

## 2018-07-23 ENCOUNTER — Encounter (HOSPITAL_COMMUNITY)
Admission: RE | Admit: 2018-07-23 | Discharge: 2018-07-23 | Disposition: A | Discharge status: Home / Self Care | Payer: Self-pay | Source: Ambulatory Visit | Attending: Pulmonary Disease | Admitting: Pulmonary Disease

## 2018-07-23 DIAGNOSIS — J45909 Unspecified asthma, uncomplicated: Secondary | ICD-10-CM | POA: Insufficient documentation

## 2018-07-23 DIAGNOSIS — G4730 Sleep apnea, unspecified: Secondary | ICD-10-CM | POA: Insufficient documentation

## 2018-07-23 DIAGNOSIS — K219 Gastro-esophageal reflux disease without esophagitis: Secondary | ICD-10-CM | POA: Insufficient documentation

## 2018-07-23 DIAGNOSIS — J449 Chronic obstructive pulmonary disease, unspecified: Secondary | ICD-10-CM | POA: Insufficient documentation

## 2018-07-23 DIAGNOSIS — Z5189 Encounter for other specified aftercare: Secondary | ICD-10-CM | POA: Insufficient documentation

## 2018-07-23 DIAGNOSIS — M810 Age-related osteoporosis without current pathological fracture: Secondary | ICD-10-CM | POA: Insufficient documentation

## 2018-07-23 NOTE — Telephone Encounter (Signed)
Control database checked last refill: 06/22/2018 LOV: 01/19/2018 NOV: NONE

## 2018-07-27 ENCOUNTER — Telehealth: Payer: Self-pay | Admitting: Internal Medicine

## 2018-07-27 NOTE — Telephone Encounter (Signed)
Got a letter from [place] 07/01/18 date saying dupixent approve with expriation date 11/10/18./ Please see prior phione message where patient says she cannot start dupioxent till dec 2019 because of insurance issues. In this letter it says it will expire dec 2019. Please sort out and have patient switch over to dupixent if she is willing

## 2018-07-28 ENCOUNTER — Encounter (HOSPITAL_COMMUNITY)
Admission: RE | Admit: 2018-07-28 | Discharge: 2018-07-28 | Disposition: A | Discharge status: Home / Self Care | Payer: Self-pay | Source: Ambulatory Visit | Attending: Internal Medicine | Admitting: Internal Medicine

## 2018-07-29 NOTE — Telephone Encounter (Signed)
Lmtcb, I only ins I see on file is Medicare & Medicaid. [employer]?

## 2018-07-29 NOTE — Telephone Encounter (Signed)
[name] [name] can you help with this. Thanks

## 2018-07-30 ENCOUNTER — Encounter (HOSPITAL_COMMUNITY)
Admission: RE | Admit: 2018-07-30 | Discharge: 2018-07-30 | Disposition: A | Discharge status: Home / Self Care | Payer: Self-pay | Source: Ambulatory Visit | Attending: Internal Medicine | Admitting: Internal Medicine

## 2018-07-30 NOTE — Telephone Encounter (Signed)
[name] [name] is no longer with [employer], per pt.. She called me back today, she has Medicare and Medicaid. She gave [place] [employer] as her ins. Co., because we would have sent everything to [facility]. A Copy of pt's Medicare card was faxed with her enrollment form. I'll call [employer] 07/31/18 as it is 5:21 right now.

## 2018-07-31 ENCOUNTER — Ambulatory Visit (INDEPENDENT_AMBULATORY_CARE_PROVIDER_SITE_OTHER): Payer: Medicare Other

## 2018-07-31 DIAGNOSIS — D721 Eosinophilia, unspecified: Secondary | ICD-10-CM

## 2018-07-31 MED ORDER — OMALIZUMAB 150 MG ~~LOC~~ SOLR
150.0000 mg | Freq: Once | SUBCUTANEOUS | Status: AC
  Administered 2018-07-31: 150 mg via SUBCUTANEOUS

## 2018-07-31 NOTE — Telephone Encounter (Signed)
Will route to [place], as pt sees MR.

## 2018-08-03 NOTE — Telephone Encounter (Signed)
[name] [name] to address

## 2018-08-04 ENCOUNTER — Encounter (HOSPITAL_COMMUNITY)
Admission: RE | Admit: 2018-08-04 | Discharge: 2018-08-04 | Disposition: A | Discharge status: Home / Self Care | Payer: Self-pay | Source: Ambulatory Visit | Attending: Internal Medicine | Admitting: Internal Medicine

## 2018-08-05 NOTE — Telephone Encounter (Signed)
[employer] covers pt's medications. It won't be renewed until this December. I called and they won't extend the P/A.

## 2018-08-05 NOTE — Telephone Encounter (Signed)
Routing to [employer] for her to address as this is an injection med.

## 2018-08-06 ENCOUNTER — Encounter (HOSPITAL_COMMUNITY): Payer: Self-pay
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
## 2018-08-10 ENCOUNTER — Ambulatory Visit: Payer: Medicare Other

## 2018-08-11 ENCOUNTER — Encounter (HOSPITAL_COMMUNITY): Payer: Self-pay

## 2018-08-11 DIAGNOSIS — J449 Chronic obstructive pulmonary disease, unspecified: Secondary | ICD-10-CM | POA: Insufficient documentation

## 2018-08-11 DIAGNOSIS — K219 Gastro-esophageal reflux disease without esophagitis: Secondary | ICD-10-CM | POA: Insufficient documentation

## 2018-08-11 DIAGNOSIS — Z5189 Encounter for other specified aftercare: Secondary | ICD-10-CM | POA: Insufficient documentation

## 2018-08-11 DIAGNOSIS — M810 Age-related osteoporosis without current pathological fracture: Secondary | ICD-10-CM | POA: Insufficient documentation

## 2018-08-11 DIAGNOSIS — G4730 Sleep apnea, unspecified: Secondary | ICD-10-CM | POA: Insufficient documentation

## 2018-08-11 DIAGNOSIS — J45909 Unspecified asthma, uncomplicated: Secondary | ICD-10-CM | POA: Insufficient documentation

## 2018-08-13 ENCOUNTER — Encounter (HOSPITAL_COMMUNITY)
Admission: RE | Admit: 2018-08-13 | Discharge: 2018-08-13 | Disposition: A | Discharge status: Home / Self Care | Payer: Self-pay | Source: Ambulatory Visit | Attending: Pulmonary Disease | Admitting: Pulmonary Disease

## 2018-08-17 ENCOUNTER — Telehealth: Payer: Self-pay | Admitting: Internal Medicine

## 2018-08-17 ENCOUNTER — Ambulatory Visit (INDEPENDENT_AMBULATORY_CARE_PROVIDER_SITE_OTHER): Payer: Medicare Other | Admitting: *Deleted

## 2018-08-17 VITALS — BP 142/84 | HR 70 | Resp 18 | Ht 64.0 in | Wt 175.0 lb

## 2018-08-17 DIAGNOSIS — Z0000 Encounter for general adult medical examination without abnormal findings: Secondary | ICD-10-CM

## 2018-08-17 MED ORDER — BUDESONIDE-FORMOTEROL FUMARATE 160-4.5 MCG/ACT IN AERO: 2.0000 | INHALATION_SPRAY | Freq: Two times a day (BID) | RESPIRATORY_TRACT | 0 refills | Status: DC

## 2018-08-17 NOTE — Patient Instructions (Signed)
Continue doing brain stimulating activities (puzzles, reading, adult coloring books, staying active) to keep memory sharp.   Continue to eat heart healthy diet (full of fruits, vegetables, whole grains, lean protein, water--limit salt, fat, and sugar intake) and increase physical activity as tolerated.  Health Maintenance, Female Adopting a healthy lifestyle and getting preventive care can go a long way to promote health and wellness. Talk with your health care provider about what schedule of regular examinations is right for you. This is a good chance for you to check in with your provider about disease prevention and staying healthy. In between checkups, there are plenty of things you can do on your own. Experts have done a lot of research about which lifestyle changes and preventive measures are most likely to keep you healthy. Ask your health care provider for more information. Weight and diet Eat a healthy diet  Be sure to include plenty of vegetables, fruits, low-fat dairy products, and lean protein.  Do not eat a lot of foods high in solid fats, added sugars, or salt.  Get regular exercise. This is one of the most important things you can do for your health. ? Most adults should exercise for at least 150 minutes each week. The exercise should increase your heart rate and make you sweat (moderate-intensity exercise). ? Most adults should also do strengthening exercises at least twice a week. This is in addition to the moderate-intensity exercise.  Maintain a healthy weight  Body mass index (BMI) is a measurement that can be used to identify possible weight problems. It estimates body fat based on height and weight. Your health care provider can help determine your BMI and help you achieve or maintain a healthy weight.  For females 40 years of age and older: ? A BMI below 18.5 is considered underweight. ? A BMI of 18.5 to 24.9 is normal. ? A BMI of 25 to 29.9 is considered  overweight. ? A BMI of 30 and above is considered obese.  Watch levels of cholesterol and blood lipids  You should start having your blood tested for lipids and cholesterol at 75 years of age, then have this test every 5 years.  You may need to have your cholesterol levels checked more often if: ? Your lipid or cholesterol levels are high. ? You are older than 75 years of age. ? You are at high risk for heart disease.  Cancer screening Lung Cancer  Lung cancer screening is recommended for adults 53-15 years old who are at high risk for lung cancer because of a history of smoking.  A yearly low-dose CT scan of the lungs is recommended for people who: ? Currently smoke. ? Have quit within the past 15 years. ? Have at least a 30-pack-year history of smoking. A pack year is smoking an average of one pack of cigarettes a day for 1 year.  Yearly screening should continue until it has been 15 years since you quit.  Yearly screening should stop if you develop a health problem that would prevent you from having lung cancer treatment.  Breast Cancer  Practice breast self-awareness. This means understanding how your breasts normally appear and feel.  It also means doing regular breast self-exams. Let your health care provider know about any changes, no matter how small.  If you are in your 20s or 30s, you should have a clinical breast exam (CBE) by a health care provider every 1-3 years as part of a regular health exam.  If  you are 40 or older, have a CBE every year. Also consider having a breast X-ray (mammogram) every year.  If you have a family history of breast cancer, talk to your health care provider about genetic screening.  If you are at high risk for breast cancer, talk to your health care provider about having an MRI and a mammogram every year.  Breast cancer gene (BRCA) assessment is recommended for women who have family members with BRCA-related cancers. BRCA-related cancers  include: ? Breast. ? Ovarian. ? Tubal. ? Peritoneal cancers.  Results of the assessment will determine the need for genetic counseling and BRCA1 and BRCA2 testing.  Cervical Cancer Your health care provider may recommend that you be screened regularly for cancer of the pelvic organs (ovaries, uterus, and vagina). This screening involves a pelvic examination, including checking for microscopic changes to the surface of your cervix (Pap test). You may be encouraged to have this screening done every 3 years, beginning at age 34.  For women ages 5-65, health care providers may recommend pelvic exams and Pap testing every 3 years, or they may recommend the Pap and pelvic exam, combined with testing for human papilloma virus (HPV), every 5 years. Some types of HPV increase your risk of cervical cancer. Testing for HPV may also be done on women of any age with unclear Pap test results.  Other health care providers may not recommend any screening for nonpregnant women who are considered low risk for pelvic cancer and who do not have symptoms. Ask your health care provider if a screening pelvic exam is right for you.  If you have had past treatment for cervical cancer or a condition that could lead to cancer, you need Pap tests and screening for cancer for at least 20 years after your treatment. If Pap tests have been discontinued, your risk factors (such as having a new sexual partner) need to be reassessed to determine if screening should resume. Some women have medical problems that increase the chance of getting cervical cancer. In these cases, your health care provider may recommend more frequent screening and Pap tests.  Colorectal Cancer  This type of cancer can be detected and often prevented.  Routine colorectal cancer screening usually begins at 75 years of age and continues through 75 years of age.  Your health care provider may recommend screening at an earlier age if you have risk factors  for colon cancer.  Your health care provider may also recommend using home test kits to check for hidden blood in the stool.  A small camera at the end of a tube can be used to examine your colon directly (sigmoidoscopy or colonoscopy). This is done to check for the earliest forms of colorectal cancer.  Routine screening usually begins at age 70.  Direct examination of the colon should be repeated every 5-10 years through 75 years of age. However, you may need to be screened more often if early forms of precancerous polyps or small growths are found.  Skin Cancer  Check your skin from head to toe regularly.  Tell your health care provider about any new moles or changes in moles, especially if there is a change in a mole's shape or color.  Also tell your health care provider if you have a mole that is larger than the size of a pencil eraser.  Always use sunscreen. Apply sunscreen liberally and repeatedly throughout the day.  Protect yourself by wearing long sleeves, pants, a wide-brimmed hat, and sunglasses  whenever you are outside.  Heart disease, diabetes, and high blood pressure  High blood pressure causes heart disease and increases the risk of stroke. High blood pressure is more likely to develop in: ? People who have blood pressure in the high end of the normal range (130-139/85-89 mm Hg). ? People who are overweight or obese. ? People who are African American.  If you are 64-28 years of age, have your blood pressure checked every 3-5 years. If you are 58 years of age or older, have your blood pressure checked every year. You should have your blood pressure measured twice-once when you are at a hospital or clinic, and once when you are not at a hospital or clinic. Record the average of the two measurements. To check your blood pressure when you are not at a hospital or clinic, you can use: ? An automated blood pressure machine at a pharmacy. ? A home blood pressure monitor.  If  you are between 46 years and 42 years old, ask your health care provider if you should take aspirin to prevent strokes.  Have regular diabetes screenings. This involves taking a blood sample to check your fasting blood sugar level. ? If you are at a normal weight and have a low risk for diabetes, have this test once every three years after 75 years of age. ? If you are overweight and have a high risk for diabetes, consider being tested at a younger age or more often. Preventing infection Hepatitis B  If you have a higher risk for hepatitis B, you should be screened for this virus. You are considered at high risk for hepatitis B if: ? You were born in a country where hepatitis B is common. Ask your health care provider which countries are considered high risk. ? Your parents were born in a high-risk country, and you have not been immunized against hepatitis B (hepatitis B vaccine). ? You have HIV or AIDS. ? You use needles to inject street drugs. ? You live with someone who has hepatitis B. ? You have had sex with someone who has hepatitis B. ? You get hemodialysis treatment. ? You take certain medicines for conditions, including cancer, organ transplantation, and autoimmune conditions.  Hepatitis C  Blood testing is recommended for: ? Everyone born from [birth year] through 1963. ? Anyone with known risk factors for hepatitis C.  Sexually transmitted infections (STIs)  You should be screened for sexually transmitted infections (STIs) including gonorrhea and chlamydia if: ? You are sexually active and are younger than 75 years of age. ? You are older than 75 years of age and your health care provider tells you that you are at risk for this type of infection. ? Your sexual activity has changed since you were last screened and you are at an increased risk for chlamydia or gonorrhea. Ask your health care provider if you are at risk.  If you do not have HIV, but are at risk, it may be recommended  that you take a prescription medicine daily to prevent HIV infection. This is called pre-exposure prophylaxis (PrEP). You are considered at risk if: ? You are sexually active and do not regularly use condoms or know the HIV status of your partner(s). ? You take drugs by injection. ? You are sexually active with a partner who has HIV.  Talk with your health care provider about whether you are at high risk of being infected with HIV. If you choose to begin PrEP, you should  first be tested for HIV. You should then be tested every 3 months for as long as you are taking PrEP. Pregnancy  If you are premenopausal and you may become pregnant, ask your health care provider about preconception counseling.  If you may become pregnant, take 400 to 800 micrograms (mcg) of folic acid every day.  If you want to prevent pregnancy, talk to your health care provider about birth control (contraception). Osteoporosis and menopause  Osteoporosis is a disease in which the bones lose minerals and strength with aging. This can result in serious bone fractures. Your risk for osteoporosis can be identified using a bone density scan.  If you are 38 years of age or older, or if you are at risk for osteoporosis and fractures, ask your health care provider if you should be screened.  Ask your health care provider whether you should take a calcium or vitamin D supplement to lower your risk for osteoporosis.  Menopause may have certain physical symptoms and risks.  Hormone replacement therapy may reduce some of these symptoms and risks. Talk to your health care provider about whether hormone replacement therapy is right for you. Follow these instructions at home:  Schedule regular health, dental, and eye exams.  Stay current with your immunizations.  Do not use any tobacco products including cigarettes, chewing tobacco, or electronic cigarettes.  If you are pregnant, do not drink alcohol.  If you are  breastfeeding, limit how much and how often you drink alcohol.  Limit alcohol intake to no more than 1 drink per day for nonpregnant women. One drink equals 12 ounces of beer, 5 ounces of Justyn Langham, or 1 ounces of hard liquor.  Do not use street drugs.  Do not share needles.  Ask your health care provider for help if you need support or information about quitting drugs.  Tell your health care provider if you often feel depressed.  Tell your health care provider if you have ever been abused or do not feel safe at home. This information is not intended to replace advice given to you by your health care provider. Make sure you discuss any questions you have with your health care provider. Document Released: 05/13/2011 Document Revised: 04/04/2016 Document Reviewed: 08/01/2015 Elsevier Interactive Patient Education  [employer].
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
## 2018-08-17 NOTE — Telephone Encounter (Signed)
I'll just have to do a new P/A in Dec. When pt. Pharmacy benefit is renewed thru [place]. Routing to MR & [name]. Juluis Rainier)

## 2018-08-17 NOTE — Telephone Encounter (Signed)
Spoke with Patient.  She was requesting Symbicort 160 sample, if possible. Per last OV with MR- Plan Continue spiriva, singulair, and symbicort scheduled + albuterol as needed Sample of Symbicort 160 given to Patient.  Nothing further at this time.

## 2018-08-18 ENCOUNTER — Encounter (HOSPITAL_COMMUNITY)
Admission: RE | Admit: 2018-08-18 | Discharge: 2018-08-18 | Disposition: A | Discharge status: Home / Self Care | Payer: Self-pay | Source: Ambulatory Visit | Attending: Internal Medicine | Admitting: Internal Medicine

## 2018-08-18 NOTE — Progress Notes (Signed)
Medical screening examination/treatment/procedure(s) were performed by non-physician practitioner and as supervising physician I was immediately available for consultation/collaboration. I agree with above. Elizabeth A Crawford, MD 

## 2018-08-18 NOTE — Telephone Encounter (Signed)
Ok thanks 

## 2018-08-20 ENCOUNTER — Encounter (HOSPITAL_COMMUNITY): Payer: Self-pay

## 2018-08-25 ENCOUNTER — Encounter (HOSPITAL_COMMUNITY)
Admission: RE | Admit: 2018-08-25 | Discharge: 2018-08-25 | Disposition: A | Discharge status: Home / Self Care | Payer: Medicare Other | Source: Ambulatory Visit | Attending: Internal Medicine | Admitting: Internal Medicine

## 2018-08-27 ENCOUNTER — Encounter (HOSPITAL_COMMUNITY)
Admission: RE | Admit: 2018-08-27 | Discharge: 2018-08-27 | Disposition: A | Discharge status: Home / Self Care | Payer: Medicare Other | Source: Ambulatory Visit | Attending: Internal Medicine | Admitting: Internal Medicine

## 2018-08-28 ENCOUNTER — Telehealth: Payer: Self-pay | Admitting: Internal Medicine

## 2018-08-28 ENCOUNTER — Ambulatory Visit (INDEPENDENT_AMBULATORY_CARE_PROVIDER_SITE_OTHER): Payer: Medicare Other

## 2018-08-28 ENCOUNTER — Telehealth: Payer: Self-pay

## 2018-08-28 DIAGNOSIS — J4550 Severe persistent asthma, uncomplicated: Secondary | ICD-10-CM | POA: Diagnosis not present

## 2018-08-28 MED ORDER — OMALIZUMAB 150 MG ~~LOC~~ SOLR
150.0000 mg | Freq: Once | SUBCUTANEOUS | Status: AC
Start: 2018-08-28 — End: 2018-08-28
  Administered 2018-08-28: 150 mg via SUBCUTANEOUS

## 2018-08-28 MED ORDER — DOXYCYCLINE HYCLATE 100 MG PO TABS: 100.0000 mg | ORAL_TABLET | Freq: Two times a day (BID) | ORAL | 0 refills | Status: DC

## 2018-08-28 MED ORDER — PREDNISONE 10 MG PO TABS: ORAL_TABLET | ORAL | 0 refills | Status: DC

## 2018-08-28 NOTE — Telephone Encounter (Signed)
Spoke with the pt  She states she is approved to start [place], but not until Dec 2019  I asked her about allergy to Doxy  She reports it may have caused mild nausea "but I have this every every abx" She has taken this multiple x in the past  Rxs sent to pharm  She will call for appt if not improving

## 2018-08-28 NOTE — Telephone Encounter (Signed)
What happened to her dupixent start?  Please take prednisone 40 mg x1 day, then 30 mg x1 day, then 20 mg x1 day, then 10 mg x1 day, and then 5 mg x1 day and stop  Take doxycycline 100mg  po twice daily x 5 days; take after meals and avoid sunlight    Allergies  Allergen Reactions  . Influenza Vaccines Swelling    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  . Latex Anaphylaxis and Swelling  . Albuterol Anxiety    Switched to xopenex  . Advair Diskus [Fluticasone-Salmeterol]     Tingling in mouth/ears ringing  . Diclofenac Sodium     REACTION: Hives  . Diclofenac Sodium Swelling  . Doxycycline Nausea And Vomiting  . Dulera [Mometasone Furo-Formoterol Fum]     HA  . Other Swelling  . Penicillins Swelling    Tongue swelling  . Sulfonamide Derivatives     Tongue swelling   . Tiotropium Itching and Other (See Comments)    dysuria  . Tiotropium Bromide Monohydrate     Tongue/mouth itching Dysuria

## 2018-08-28 NOTE — Telephone Encounter (Signed)
Called and spoke with patient regarding not feeling well today. Pt advised she has increase wheezing, productive cough-clear, SOB with exertion in last 3 days. Pt states wheezing has increased this morning. She had her xolair injection 150mg  this morning in office She denied coming in today for visit as she has no transportation again today. She denied taking any meds for her wheezing and SOB today. She would like to have something called in to better help her wheezing and cough. Advised that she needs to come in to be seen, she refused.  MR please advise.

## 2018-08-28 NOTE — Telephone Encounter (Signed)
LMTCB.   We received a form from her insurance saying that they could save her money by switching from Spiriva Respimat to Incruse. MR has signed form however we need to discuss with patient. Will await a call back. Form placed in MR cubby hole.

## 2018-08-28 NOTE — Progress Notes (Signed)
Documented by Jessica Jones CMA based on hand-written Xolair documentation sheet completed by [name] Scott CMA, who administered the medication.  

## 2018-09-01 ENCOUNTER — Encounter (HOSPITAL_COMMUNITY): Payer: Self-pay

## 2018-09-02 ENCOUNTER — Telehealth: Payer: Self-pay | Admitting: Internal Medicine

## 2018-09-03 ENCOUNTER — Encounter (HOSPITAL_COMMUNITY): Payer: Self-pay

## 2018-09-03 NOTE — Telephone Encounter (Signed)
Prefilled Syringes: # 150mg  1  #75mg  0 Arrival Date: 09/02/18 Lot #: 150mg  [id number]      75mg  0 Exp Date: 150mg  02/2019   75mg  0

## 2018-09-07 ENCOUNTER — Telehealth: Payer: Self-pay | Admitting: Internal Medicine

## 2018-09-07 NOTE — Telephone Encounter (Signed)
Called and spoke with pt letting her know that we did receive the form from her insurance in regards to the switch from spiriva respimat to incruse.  Pt wanted to make sure the incruse would be really helpful with her asthma.  MR, please advise on this for pt. Thanks!

## 2018-09-07 NOTE — Telephone Encounter (Signed)
Left message for patient to call back  

## 2018-09-07 NOTE — Telephone Encounter (Signed)
Closing this encounter as this is a duplicate encounter.  Please refer back to phone note from 08/28/18.

## 2018-09-07 NOTE — Telephone Encounter (Signed)
spiriva = incruse . So expect to be same but individual response can vary and she should give feedback after she starts

## 2018-09-08 ENCOUNTER — Encounter (HOSPITAL_COMMUNITY)
Admission: RE | Admit: 2018-09-08 | Discharge: 2018-09-08 | Disposition: A | Discharge status: Home / Self Care | Payer: Medicare Other | Source: Ambulatory Visit | Attending: Internal Medicine | Admitting: Internal Medicine

## 2018-09-08 NOTE — Telephone Encounter (Signed)
Called and spoke to patient, patient state has an appt tomorrow with MR and will discuss then. Nothing further is needed at this time.

## 2018-09-08 NOTE — Telephone Encounter (Signed)
Patient returning phone call pt contact # [phone]

## 2018-09-09 ENCOUNTER — Ambulatory Visit (INDEPENDENT_AMBULATORY_CARE_PROVIDER_SITE_OTHER): Payer: Medicare Other | Admitting: Internal Medicine

## 2018-09-09 ENCOUNTER — Telehealth: Payer: Self-pay | Admitting: Internal Medicine

## 2018-09-09 ENCOUNTER — Other Ambulatory Visit (INDEPENDENT_AMBULATORY_CARE_PROVIDER_SITE_OTHER): Payer: Medicare Other

## 2018-09-09 ENCOUNTER — Encounter: Payer: Self-pay | Admitting: Internal Medicine

## 2018-09-09 VITALS — BP 138/80 | HR 82 | Ht 62.0 in | Wt 175.0 lb

## 2018-09-09 DIAGNOSIS — I2584 Coronary atherosclerosis due to calcified coronary lesion: Secondary | ICD-10-CM

## 2018-09-09 DIAGNOSIS — I2510 Atherosclerotic heart disease of native coronary artery without angina pectoris: Secondary | ICD-10-CM | POA: Diagnosis not present

## 2018-09-09 DIAGNOSIS — N189 Chronic kidney disease, unspecified: Secondary | ICD-10-CM

## 2018-09-09 DIAGNOSIS — J4550 Severe persistent asthma, uncomplicated: Secondary | ICD-10-CM | POA: Diagnosis not present

## 2018-09-09 LAB — BASIC METABOLIC PANEL
BUN: 21 mg/dL (ref 6–23)
CALCIUM: 9.6 mg/dL (ref 8.4–10.5)
CO2: 26 mEq/L (ref 19–32)
Chloride: 105 mEq/L (ref 96–112)
Creatinine, Ser: 1.2 mg/dL (ref 0.40–1.20)
GFR: 56.24 mL/min — ABNORMAL LOW (ref >60.00)
GLUCOSE: 99 mg/dL (ref 70–99)
Potassium: 3.8 mEq/L (ref 3.5–5.1)
SODIUM: 139 meq/L (ref 135–145)

## 2018-09-09 MED ORDER — PREDNISONE 10 MG PO TABS: ORAL_TABLET | ORAL | 3 refills | Status: DC

## 2018-09-09 MED ORDER — BUDESONIDE-FORMOTEROL FUMARATE 160-4.5 MCG/ACT IN AERO: 2.0000 | INHALATION_SPRAY | Freq: Two times a day (BID) | RESPIRATORY_TRACT | 0 refills | Status: DC

## 2018-09-09 NOTE — Telephone Encounter (Signed)
[name] [name]  Please add doxy to allergy list - ringing ears and tummy pain  ALso , please send 3 refills of - Please take prednisone 40 mg x1 day, then 30 mg x1 day, then 20 mg x1 day, then 10 mg x1 day, and then 5 mg x1 day and stop

## 2018-09-09 NOTE — Patient Instructions (Addendum)
Severe persistent asthma without complication - Plan: CANCELED: Nitric oxide  - stable  - start dupixent dec 2019 - ensure papper work is good for this. Stop xolair after dupixent - continue spiriva but can trial sample incruse (currently out of it) - continue symbicort daily - respect no flu shot due to allergy - allergy list - add doxy - give 3 rrefills of5 d pred  Chronic kidney disease, unspecified CKD stage  - last check April 2019 - recheck bmet  Followup 3 months or sooner if needed

## 2018-09-09 NOTE — Addendum Note (Signed)
Addended by: Della Goo C on: 09/09/2018 10:04 AM   Modules accepted: Orders
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
## 2018-09-09 NOTE — Telephone Encounter (Signed)
These have both been done. Nothing further needed.

## 2018-09-10 ENCOUNTER — Encounter (HOSPITAL_COMMUNITY)
Admission: RE | Admit: 2018-09-10 | Discharge: 2018-09-10 | Disposition: A | Discharge status: Home / Self Care | Payer: Self-pay | Source: Ambulatory Visit | Attending: Internal Medicine | Admitting: Internal Medicine

## 2018-09-14 ENCOUNTER — Telehealth: Payer: Self-pay | Admitting: Internal Medicine

## 2018-09-14 NOTE — Telephone Encounter (Signed)
Called and spoke with patient she is aware of results and verbalized understanding. Nothing further needed.  

## 2018-09-15 ENCOUNTER — Encounter (HOSPITAL_COMMUNITY)
Admission: RE | Admit: 2018-09-15 | Discharge: 2018-09-15 | Disposition: A | Discharge status: Home / Self Care | Payer: Self-pay | Source: Ambulatory Visit | Attending: Pulmonary Disease | Admitting: Pulmonary Disease

## 2018-09-15 DIAGNOSIS — J45909 Unspecified asthma, uncomplicated: Secondary | ICD-10-CM | POA: Insufficient documentation

## 2018-09-15 DIAGNOSIS — K219 Gastro-esophageal reflux disease without esophagitis: Secondary | ICD-10-CM | POA: Insufficient documentation

## 2018-09-15 DIAGNOSIS — M810 Age-related osteoporosis without current pathological fracture: Secondary | ICD-10-CM | POA: Insufficient documentation

## 2018-09-15 DIAGNOSIS — J449 Chronic obstructive pulmonary disease, unspecified: Secondary | ICD-10-CM | POA: Insufficient documentation

## 2018-09-15 DIAGNOSIS — Z5189 Encounter for other specified aftercare: Secondary | ICD-10-CM | POA: Insufficient documentation

## 2018-09-15 DIAGNOSIS — G4730 Sleep apnea, unspecified: Secondary | ICD-10-CM | POA: Insufficient documentation

## 2018-09-17 ENCOUNTER — Encounter (HOSPITAL_COMMUNITY): Payer: Self-pay

## 2018-09-21 ENCOUNTER — Other Ambulatory Visit: Payer: Self-pay | Admitting: Internal Medicine

## 2018-09-21 NOTE — Telephone Encounter (Signed)
Control database checked last refill: 06/22/2018 Called pharmacy they state last refill was 08/24/2018 LOV: 01/19/2018 NOV: none

## 2018-09-22 ENCOUNTER — Encounter (HOSPITAL_COMMUNITY): Payer: Self-pay

## 2018-09-24 ENCOUNTER — Telehealth: Payer: Self-pay | Admitting: Internal Medicine

## 2018-09-24 ENCOUNTER — Encounter (HOSPITAL_COMMUNITY)
Admission: RE | Admit: 2018-09-24 | Discharge: 2018-09-24 | Disposition: A | Discharge status: Home / Self Care | Payer: Self-pay | Source: Ambulatory Visit | Attending: Internal Medicine | Admitting: Internal Medicine

## 2018-09-24 NOTE — Telephone Encounter (Signed)
Prefilled Syringes: # 150mg  1  #75mg  0 Arrival Date: 09/24/18 Lot #: 150mg  [id number]      75mg  0 Exp Date: 150mg  02/2019   75mg  0

## 2018-09-28 ENCOUNTER — Ambulatory Visit: Payer: Medicare Other

## 2018-09-29 ENCOUNTER — Encounter (HOSPITAL_COMMUNITY)
Admission: RE | Admit: 2018-09-29 | Discharge: 2018-09-29 | Disposition: A | Discharge status: Home / Self Care | Payer: Self-pay | Source: Ambulatory Visit | Attending: Internal Medicine | Admitting: Internal Medicine

## 2018-09-29 ENCOUNTER — Ambulatory Visit: Payer: Medicare Other

## 2018-10-01 ENCOUNTER — Ambulatory Visit (INDEPENDENT_AMBULATORY_CARE_PROVIDER_SITE_OTHER): Payer: Medicare Other

## 2018-10-01 ENCOUNTER — Encounter (HOSPITAL_COMMUNITY)
Admission: RE | Admit: 2018-10-01 | Discharge: 2018-10-01 | Disposition: A | Discharge status: Home / Self Care | Payer: Self-pay | Source: Ambulatory Visit | Attending: Internal Medicine | Admitting: Internal Medicine

## 2018-10-01 ENCOUNTER — Ambulatory Visit: Payer: Medicare Other

## 2018-10-01 DIAGNOSIS — J4550 Severe persistent asthma, uncomplicated: Secondary | ICD-10-CM | POA: Diagnosis not present

## 2018-10-01 DIAGNOSIS — D721 Eosinophilia, unspecified: Secondary | ICD-10-CM

## 2018-10-01 NOTE — Progress Notes (Signed)
Documented by Clee Pandit CMA based on hand-written Xolair documentation sheet completed by [name] Scott CMA, who administered the medication.  

## 2018-10-06 ENCOUNTER — Encounter (HOSPITAL_COMMUNITY): Payer: Self-pay

## 2018-10-09 ENCOUNTER — Other Ambulatory Visit: Payer: Self-pay | Admitting: Internal Medicine

## 2018-10-09 MED ORDER — TIOTROPIUM BROMIDE MONOHYDRATE 2.5 MCG/ACT IN AERS: 2.0000 | INHALATION_SPRAY | Freq: Every day | RESPIRATORY_TRACT | 1 refills | Status: DC

## 2018-10-13 ENCOUNTER — Encounter (HOSPITAL_COMMUNITY): Payer: Self-pay | Attending: Pulmonary Disease

## 2018-10-13 DIAGNOSIS — J45909 Unspecified asthma, uncomplicated: Secondary | ICD-10-CM | POA: Insufficient documentation

## 2018-10-13 DIAGNOSIS — M810 Age-related osteoporosis without current pathological fracture: Secondary | ICD-10-CM | POA: Insufficient documentation

## 2018-10-13 DIAGNOSIS — Z5189 Encounter for other specified aftercare: Secondary | ICD-10-CM | POA: Insufficient documentation

## 2018-10-13 DIAGNOSIS — J449 Chronic obstructive pulmonary disease, unspecified: Secondary | ICD-10-CM | POA: Insufficient documentation

## 2018-10-13 DIAGNOSIS — K219 Gastro-esophageal reflux disease without esophagitis: Secondary | ICD-10-CM | POA: Insufficient documentation

## 2018-10-13 DIAGNOSIS — G4730 Sleep apnea, unspecified: Secondary | ICD-10-CM | POA: Insufficient documentation

## 2018-10-15 ENCOUNTER — Encounter (HOSPITAL_COMMUNITY): Payer: Self-pay

## 2018-10-19 ENCOUNTER — Telehealth: Payer: Self-pay

## 2018-10-19 NOTE — Telephone Encounter (Signed)
Copied from [place] [phone]. Topic: General - Other >> Oct 16, 2018  4:08 PM Leward Quan A wrote: Reason for CRM: Patient called back to say that she was out of town and got back saw a missed call on her caller ID but nothing is noted in the system. Missed call on 10/12/18 at 4.02pm >> Oct 16, 2018  4:38 PM Para Skeans A wrote: Did you call this patient.

## 2018-10-19 NOTE — Telephone Encounter (Signed)
I did not call the patient and I don't see anyone in the epic system that would of called patient unless cardiology called about no show appointments on the 5th and 3rd

## 2018-10-20 ENCOUNTER — Encounter (HOSPITAL_COMMUNITY): Payer: Self-pay

## 2018-10-21 ENCOUNTER — Ambulatory Visit: Payer: Medicare Other | Admitting: Internal Medicine

## 2018-10-22 ENCOUNTER — Encounter (HOSPITAL_COMMUNITY): Payer: Self-pay

## 2018-10-23 ENCOUNTER — Telehealth: Payer: Self-pay | Admitting: Internal Medicine

## 2018-10-23 NOTE — Telephone Encounter (Signed)
Patient is request a different dosage so she can get her xanax filled. Sent for provider review- request new Rx.

## 2018-10-23 NOTE — Telephone Encounter (Signed)
Patient will need to call local pharmacies to see if they have the 0.25mg  in stock

## 2018-10-23 NOTE — Telephone Encounter (Signed)
LVM informing patient on response below

## 2018-10-23 NOTE — Telephone Encounter (Signed)
Copied from [place] [phone]. Topic: Quick Communication - Rx Refill/Question >> Oct 23, 2018  1:48 PM [place], [place] D wrote: Medication: ALPRAZolam Duanne Moron) 0.25 MG tablet / Pt stated she contacted pharmacy for refill and was told they no longer have the .25mg  strength but 1MG  or 2MG . Requesting new rx. Please advise   Has the patient contacted their pharmacy? Yes.   (Agent: If no, request that the patient contact the pharmacy for the refill.) (Agent: If yes, when and what did the pharmacy advise?)  Preferred Pharmacy (with phone number or street name): [id number] [address], [place]. [phone] (Phone) [phone] (Fax)    Agent: Please be advised that RX refills may take up to 3 business days. We ask that you follow-up with your pharmacy.

## 2018-10-23 NOTE — Telephone Encounter (Signed)
Prefilled Syringes: # 150mg  1  #75mg  0 Arrival Date: 10/23/18 Lot #: 150mg  [id number]      75mg  0 Exp Date: 150mg  04/2019   75mg  0 Crazy busy, order didn't get put in.

## 2018-10-27 ENCOUNTER — Ambulatory Visit (INDEPENDENT_AMBULATORY_CARE_PROVIDER_SITE_OTHER): Payer: Medicare Other

## 2018-10-27 ENCOUNTER — Encounter (HOSPITAL_COMMUNITY): Payer: Self-pay

## 2018-10-27 ENCOUNTER — Telehealth: Payer: Self-pay | Admitting: *Deleted

## 2018-10-27 DIAGNOSIS — J4550 Severe persistent asthma, uncomplicated: Secondary | ICD-10-CM

## 2018-10-28 NOTE — Telephone Encounter (Signed)
Called and spoke with pt to see if I could help her out with what she was needing to tell TS and pt stated that TS called her back yesterday, 12/17. Nothing further needed.

## 2018-10-29 ENCOUNTER — Encounter (HOSPITAL_COMMUNITY): Payer: Self-pay

## 2018-10-29 ENCOUNTER — Ambulatory Visit: Payer: Medicare Other

## 2018-10-30 MED ORDER — OMALIZUMAB 150 MG ~~LOC~~ SOLR
150.0000 mg | SUBCUTANEOUS | Status: DC
Start: 2018-10-30 — End: 2019-10-26
  Administered 2018-10-27: 150 mg via SUBCUTANEOUS

## 2018-11-03 ENCOUNTER — Encounter (HOSPITAL_COMMUNITY): Payer: Self-pay

## 2018-11-05 ENCOUNTER — Telehealth: Payer: Self-pay | Admitting: Internal Medicine

## 2018-11-05 ENCOUNTER — Encounter (HOSPITAL_COMMUNITY): Payer: Self-pay

## 2018-11-06 NOTE — Telephone Encounter (Signed)
Spoke with pt., we don't have her Rx plan card because they haven't sent her one yet. She gave me the info I needed. (that she had been given by [employer].)

## 2018-11-10 ENCOUNTER — Encounter (HOSPITAL_COMMUNITY): Payer: Self-pay

## 2018-11-10 NOTE — Telephone Encounter (Signed)
[name], please advise if everything has been able to be taken care of for pt and if this open encounter can be closed? Thanks!

## 2018-11-10 NOTE — Telephone Encounter (Signed)
I have all the info, I haven't been able to get through. I'll call them again Thursday.

## 2018-11-12 ENCOUNTER — Encounter (HOSPITAL_COMMUNITY): Payer: Self-pay | Attending: Pulmonary Disease

## 2018-11-12 DIAGNOSIS — M810 Age-related osteoporosis without current pathological fracture: Secondary | ICD-10-CM | POA: Insufficient documentation

## 2018-11-12 DIAGNOSIS — J449 Chronic obstructive pulmonary disease, unspecified: Secondary | ICD-10-CM | POA: Insufficient documentation

## 2018-11-12 DIAGNOSIS — J45909 Unspecified asthma, uncomplicated: Secondary | ICD-10-CM | POA: Insufficient documentation

## 2018-11-12 DIAGNOSIS — G4730 Sleep apnea, unspecified: Secondary | ICD-10-CM | POA: Insufficient documentation

## 2018-11-12 DIAGNOSIS — K219 Gastro-esophageal reflux disease without esophagitis: Secondary | ICD-10-CM | POA: Insufficient documentation

## 2018-11-12 DIAGNOSIS — Z5189 Encounter for other specified aftercare: Secondary | ICD-10-CM | POA: Insufficient documentation

## 2018-11-13 NOTE — Telephone Encounter (Signed)
[name], please advise if this has been taken care of and if encounter can be closed?

## 2018-11-13 NOTE — Telephone Encounter (Signed)
I was unable to send the info to [place] my way. I'll send it next wk., once the meds are ordered.

## 2018-11-16 ENCOUNTER — Telehealth: Payer: Self-pay | Admitting: Internal Medicine

## 2018-11-16 NOTE — Telephone Encounter (Signed)
Called pt to make her aware I sent her SCAT form to [facility] instead of sending it to her. I just realized it a few minutes ago when I checked the ph. Note. Sorry!

## 2018-11-17 ENCOUNTER — Encounter (HOSPITAL_COMMUNITY): Payer: Self-pay

## 2018-11-17 NOTE — Telephone Encounter (Signed)
[name] can you please provide an update on this matter? Thank you.

## 2018-11-18 ENCOUNTER — Other Ambulatory Visit: Payer: Self-pay | Admitting: Internal Medicine

## 2018-11-18 NOTE — Telephone Encounter (Signed)
Message will closed as the pt was made aware that this was handled.

## 2018-11-19 ENCOUNTER — Telehealth: Payer: Self-pay

## 2018-11-19 ENCOUNTER — Encounter (HOSPITAL_COMMUNITY): Payer: Self-pay

## 2018-11-19 NOTE — Telephone Encounter (Signed)
The forms are in doctors folder to sign

## 2018-11-19 NOTE — Telephone Encounter (Signed)
Copied from [place] [phone]. Topic: General - Other >> Nov 13, 2018  1:19 PM Bea Graff, NT wrote: Reason for CRM: Danielle with [employer] calling to check status on paperwork that was faxed over in order to renew pts incontinent supplies. CB#: [phone] >> Nov 18, 2018  3:48 PM Valla Leaver wrote: Refaxing right now to [phone] and is waiting for confirmation of receipt

## 2018-11-20 NOTE — Telephone Encounter (Signed)
Called [employer], rep said to wait on summary of benefits to see if pt needs a P/A. If so if I can I'll do it through [facility]. If not I'll call [employer]. Waiting on benefits summary.

## 2018-11-23 NOTE — Telephone Encounter (Signed)
I have not received her summary of benefits.

## 2018-11-24 ENCOUNTER — Encounter (HOSPITAL_COMMUNITY): Payer: Self-pay

## 2018-11-26 ENCOUNTER — Encounter (HOSPITAL_COMMUNITY): Payer: Self-pay

## 2018-11-27 ENCOUNTER — Ambulatory Visit (INDEPENDENT_AMBULATORY_CARE_PROVIDER_SITE_OTHER): Payer: Medicare Other

## 2018-11-27 DIAGNOSIS — J4550 Severe persistent asthma, uncomplicated: Secondary | ICD-10-CM

## 2018-11-27 NOTE — Telephone Encounter (Signed)
I'll call [employer], and ask them a/b the summary benefits. I still haven't received it.

## 2018-11-27 NOTE — Telephone Encounter (Signed)
[name] please advise if you have an update on this. Thanks.

## 2018-12-01 ENCOUNTER — Encounter (HOSPITAL_COMMUNITY): Payer: Self-pay

## 2018-12-01 NOTE — Telephone Encounter (Signed)
I am helping to catch up on backlogged injection forms with TS.   Called [employer] to ensure that pt's enrollment form was received.  Representative verified that form was received on 11/17/2018, and is resending the benefits summary to our office.  Verified fax #.   Rep also states that the pt needs to call [place] [employer] to verify her need for financial assistance for the copay.  Pt needs to call [phone], dial option 1, then extension 1 to talk to a nurse educator.    lmtcb X1 for pt to make aware of the above.   Will route back to [facility] for follow-up .

## 2018-12-02 MED ORDER — OMALIZUMAB 150 MG ~~LOC~~ SOLR
150.0000 mg | Freq: Once | SUBCUTANEOUS | Status: AC
  Administered 2018-11-27: 150 mg via SUBCUTANEOUS

## 2018-12-02 NOTE — Telephone Encounter (Signed)
Called pt to inform her of recent info.. I had to leave a message with her vm.. I gave her [employer]'s # to call for co-pay asst.. I also asked her to call me back if she had questions. Will wait for pt or [employer] to cb.[name] [name]

## 2018-12-02 NOTE — Progress Notes (Signed)
Patient answered the following questions per the hand-written documentation on the Xolair Injection sheet:  Have you had a change in your insurance?  Yes,We have a copy.. Have you been in the hospital in the past 10 days?  No. Do you have a fever?  No. Do you have a cough?  Yes, dry.

## 2018-12-03 ENCOUNTER — Encounter (HOSPITAL_COMMUNITY): Payer: Self-pay

## 2018-12-07 NOTE — Telephone Encounter (Signed)
[name] can you please provide an update?

## 2018-12-08 ENCOUNTER — Encounter (HOSPITAL_COMMUNITY): Payer: Self-pay

## 2018-12-09 NOTE — Telephone Encounter (Signed)
[name] please advise. Thanks. 

## 2018-12-09 NOTE — Telephone Encounter (Signed)
Called pt, she talked to [facility], they are going to send her some paperwork to fill out. Pt said she would left [place] know when she gets it. Waiting.

## 2018-12-10 ENCOUNTER — Encounter (HOSPITAL_COMMUNITY): Payer: Self-pay

## 2018-12-11 ENCOUNTER — Ambulatory Visit (INDEPENDENT_AMBULATORY_CARE_PROVIDER_SITE_OTHER): Payer: Medicare Other | Admitting: Internal Medicine

## 2018-12-11 ENCOUNTER — Encounter: Payer: Self-pay | Admitting: Internal Medicine

## 2018-12-11 VITALS — BP 138/84 | HR 63 | Ht 62.0 in | Wt 171.4 lb

## 2018-12-11 DIAGNOSIS — J4550 Severe persistent asthma, uncomplicated: Secondary | ICD-10-CM

## 2018-12-11 DIAGNOSIS — J4551 Severe persistent asthma with (acute) exacerbation: Secondary | ICD-10-CM | POA: Diagnosis not present

## 2018-12-11 DIAGNOSIS — Z0271 Encounter for disability determination: Secondary | ICD-10-CM

## 2018-12-11 DIAGNOSIS — D721 Eosinophilia, unspecified: Secondary | ICD-10-CM

## 2018-12-11 MED ORDER — PREDNISONE 10 MG PO TABS: ORAL_TABLET | ORAL | 0 refills | Status: DC

## 2018-12-11 NOTE — Patient Instructions (Addendum)
Severe persistent asthma with acute exacerbation Severe persistent asthma without complication Eosinophilia  - currently in flare up  - Please take prednisone 40 mg x1 day, then 30 mg x1 day, then 20 mg x1 day, then 10 mg x1 day, and then 5 mg x1 day and stop  - meet with Alroy Bailiff to avoid further delays with dupixent  - Cotninue xolair for now; Stop xolair after dupixent - continue spiriva - continue symbicort daily - respect no flu shot due to allergy   Disability examination - form filled out for SCAT  Followup 3 months or sooner if needed; ACQ/Feno at followup
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
## 2018-12-15 ENCOUNTER — Encounter (HOSPITAL_COMMUNITY): Payer: Self-pay

## 2018-12-15 DIAGNOSIS — J449 Chronic obstructive pulmonary disease, unspecified: Secondary | ICD-10-CM | POA: Insufficient documentation

## 2018-12-15 DIAGNOSIS — J45909 Unspecified asthma, uncomplicated: Secondary | ICD-10-CM | POA: Insufficient documentation

## 2018-12-15 DIAGNOSIS — K219 Gastro-esophageal reflux disease without esophagitis: Secondary | ICD-10-CM | POA: Insufficient documentation

## 2018-12-15 DIAGNOSIS — Z5189 Encounter for other specified aftercare: Secondary | ICD-10-CM | POA: Insufficient documentation

## 2018-12-15 DIAGNOSIS — G4730 Sleep apnea, unspecified: Secondary | ICD-10-CM | POA: Insufficient documentation

## 2018-12-15 DIAGNOSIS — M810 Age-related osteoporosis without current pathological fracture: Secondary | ICD-10-CM | POA: Insufficient documentation

## 2018-12-15 NOTE — Telephone Encounter (Signed)
No updates at this time-pt will fill out paperwork sent to home address by Company and return to them asap. Will get call from [facility] there after.

## 2018-12-15 NOTE — Telephone Encounter (Signed)
Can an update please be provided?

## 2018-12-17 ENCOUNTER — Encounter (HOSPITAL_COMMUNITY): Payer: Self-pay

## 2018-12-22 ENCOUNTER — Encounter (HOSPITAL_COMMUNITY): Payer: Self-pay

## 2018-12-24 ENCOUNTER — Telehealth: Payer: Self-pay | Admitting: Internal Medicine

## 2018-12-24 ENCOUNTER — Encounter (HOSPITAL_COMMUNITY): Payer: Self-pay

## 2018-12-24 NOTE — Telephone Encounter (Signed)
.  Prefilled Syringes: # 150mg  1  #75mg  0  Arrival Date: 12/24/2018 Lot #: 150mg  [id number]      75mg  0 Exp Date: 150mg  10/2019   75mg  0

## 2018-12-24 NOTE — Telephone Encounter (Signed)
Can you please provide an update on this?

## 2018-12-25 ENCOUNTER — Ambulatory Visit: Payer: Medicare Other

## 2018-12-28 NOTE — Telephone Encounter (Signed)
Hey MR and [name], [facility] needs a written script for [name] [name] for 2020. Initial dose: 600 mg  SIG:2 injections subQ on day 1 Maintenance dose: SIG: 1 injection q 2 wks. Starting on day 15 Qty: 2pk  Refills:  MR, please write a script for pt, when you get a chance. Routing to [place] and MR. Thank you! Sakib Noguez

## 2018-12-29 ENCOUNTER — Encounter (HOSPITAL_COMMUNITY): Payer: Self-pay

## 2018-12-29 ENCOUNTER — Telehealth (HOSPITAL_COMMUNITY): Payer: Self-pay | Admitting: *Deleted

## 2018-12-30 ENCOUNTER — Other Ambulatory Visit: Payer: Self-pay | Admitting: Internal Medicine

## 2018-12-30 NOTE — Telephone Encounter (Signed)
Spoke with [name] [name] in regards to the written Rx that is needed for pt's Dupixent and per [name] [name], route the message to her and she will take care of the Rx.  Routing message to [employer].

## 2018-12-31 ENCOUNTER — Encounter (HOSPITAL_COMMUNITY)
Admission: RE | Admit: 2018-12-31 | Discharge: 2018-12-31 | Disposition: A | Discharge status: Home / Self Care | Payer: Self-pay | Source: Ambulatory Visit | Attending: Pulmonary Disease | Admitting: Pulmonary Disease

## 2018-12-31 NOTE — Progress Notes (Signed)
[name] [name] returned our telephone call. She states that she has had a lot of health issuses the past two months. [name] [name] has decided to drop from the Pulmonary Maintenance program. I encouraged her to call us if we can be of any further assistance to her.

## 2019-01-01 MED ORDER — DUPILUMAB 300 MG/2ML ~~LOC~~ SOSY: 600.0000 mg | PREFILLED_SYRINGE | Freq: Once | SUBCUTANEOUS | 0 refills | Status: AC

## 2019-01-01 MED ORDER — DUPILUMAB 300 MG/2ML ~~LOC~~ SOSY: 300.0000 mg | PREFILLED_SYRINGE | SUBCUTANEOUS | 6 refills | Status: DC

## 2019-01-01 NOTE — Telephone Encounter (Signed)
Rx has been printed and returned to [place]. Routing message back to [place].

## 2019-01-04 ENCOUNTER — Telehealth: Payer: Self-pay | Admitting: Internal Medicine

## 2019-01-05 ENCOUNTER — Encounter (HOSPITAL_COMMUNITY): Payer: Self-pay

## 2019-01-05 NOTE — Telephone Encounter (Signed)
[name] S, any update?

## 2019-01-06 ENCOUNTER — Telehealth: Payer: Self-pay | Admitting: Internal Medicine

## 2019-01-06 NOTE — Telephone Encounter (Signed)
Called pt to rsc appt.. Pt needed a.m. appt., I didn't have anything Fri. Morning. Thurs. Morning I was afraid Xolair wouldn't be here by 10:15. Made appt for 9:30 Mon. Morning. Nothing further needed.

## 2019-01-07 ENCOUNTER — Encounter (HOSPITAL_COMMUNITY): Payer: Self-pay

## 2019-01-07 ENCOUNTER — Other Ambulatory Visit: Payer: Self-pay | Admitting: Internal Medicine

## 2019-01-08 NOTE — Telephone Encounter (Signed)
Called [facility] at [phone]. Spoke with [name] [name]. Calling to inquire if the prescription that was faxed to them on 01/04/2019 by [name] [name] was received and what the pt's enrollment status is. This prescription was received but can't be processed due to it not being on the enrollment form. Per [name] [name], the prescription that was on the enrollment form that was sent to them in December is valid, she is not sure why another prescription was requested. She did show that the pt does not qualify for patient assistance due to her being enrolled in [facility], which is a program through [employer]. The pt's copay is only $3.90 per 30 day supply. [name] [name] stated that they had everything they needed to process this prescription and she was going to submit it to the pt's speciality pharmacy which is [facility] (ph: [phone]). At this time we are just waiting on the speciality pharmacy to process the prescription and ship it to our office.

## 2019-01-11 ENCOUNTER — Ambulatory Visit: Payer: Medicare Other

## 2019-01-12 ENCOUNTER — Encounter (HOSPITAL_COMMUNITY): Payer: Self-pay

## 2019-01-13 NOTE — Telephone Encounter (Signed)
[name] please f/u on this and give update thanks

## 2019-01-13 NOTE — Telephone Encounter (Signed)
We have an approval: PA#: [id number], Dates: 11/11/2018 until further notice, is what the rep I spoke to told me. She transferred me to a pharmacist and I gave 2 verbal Rxs. Loading dose and maintenance dose. Pharmacist said it could be ready this afternoon.  Rep scheduled del. For 01/15/2019, said it should be here around noon. Called pt to make her aware. Lm with vm, asked her to call me back and let me know when she can come in for her Dupixent inj.. I also asked her if she has an in date Epi-Pen, if not I'll call it in to her pharm., she'll pick it up and bring it with her to let me see it.  Waiting for pt to call me back.  1 prefilled syringe Ordered Date: 01/13/2019 Shipping Date: 01/14/2019

## 2019-01-14 ENCOUNTER — Encounter (HOSPITAL_COMMUNITY): Payer: Self-pay

## 2019-01-14 NOTE — Telephone Encounter (Signed)
Please advise on update for this thank you.

## 2019-01-15 NOTE — Telephone Encounter (Signed)
1 prefilled syringe Arrival Date: 01/15/2019 Lot #:[id number] Exp date: 10/2020

## 2019-01-19 ENCOUNTER — Encounter (HOSPITAL_COMMUNITY): Payer: Self-pay

## 2019-01-20 ENCOUNTER — Telehealth: Payer: Self-pay | Admitting: Internal Medicine

## 2019-01-20 NOTE — Telephone Encounter (Signed)
Called [facility] and spoke with April. They have received the pt's prescription and shipment has been set up to be delievered on 01/26/2019.  LMTCB x1 for pt to set up her first injection appointment.

## 2019-01-20 NOTE — Telephone Encounter (Signed)
Called pt to let her know her Dupixent came in. Told pt about pt the protocol for 1st injs.. Asked her to call and ask for me so I can make her appt.. Will wait for pt to call.

## 2019-01-21 ENCOUNTER — Encounter (HOSPITAL_COMMUNITY): Payer: Self-pay

## 2019-01-21 NOTE — Telephone Encounter (Signed)
I haven't heard from pt so I called her back. I left another message to please call us so we can get her Dupixent started. Waiting.

## 2019-01-21 NOTE — Telephone Encounter (Signed)
Per a face to face conversation with Alroy Bailiff, she has tried to contact the pt today and left another message with her.

## 2019-01-25 ENCOUNTER — Telehealth: Payer: Self-pay | Admitting: Internal Medicine

## 2019-01-25 NOTE — Telephone Encounter (Signed)
Beverly with [facility], she wanted to let tammy know they are sending the patients dupixent today and should be here today. No call back is needed.

## 2019-01-26 ENCOUNTER — Encounter (HOSPITAL_COMMUNITY): Payer: Self-pay

## 2019-01-26 NOTE — Telephone Encounter (Signed)
Pt. Received another Dupixent 01/26/2019. Ria Comment ordered it.  1 prefilled syringe Arrival Date: 01/26/2019 Lot #:[id number] Exp date: 10/2020

## 2019-01-27 ENCOUNTER — Other Ambulatory Visit: Payer: Self-pay | Admitting: Internal Medicine

## 2019-01-27 NOTE — Telephone Encounter (Signed)
LMTCB

## 2019-01-27 NOTE — Telephone Encounter (Signed)
Control database checked last refill: 01/05/2019 LOV: 01/19/2018 NOV: none

## 2019-01-28 ENCOUNTER — Telehealth: Payer: Self-pay | Admitting: Internal Medicine

## 2019-01-28 ENCOUNTER — Encounter (HOSPITAL_COMMUNITY): Payer: Self-pay

## 2019-01-28 NOTE — Telephone Encounter (Signed)
LVM for patient to call back and make an appointment.  °

## 2019-01-28 NOTE — Telephone Encounter (Signed)
Can you please make patient an office visit. Thank you

## 2019-01-28 NOTE — Telephone Encounter (Signed)
Called and spoke with patient, she had some questions about her injection. She stated she doesn't know when she can come and get it. Please advise, thank you.

## 2019-01-28 NOTE — Telephone Encounter (Signed)
Calling back again

## 2019-01-28 NOTE — Telephone Encounter (Signed)
Needs visit

## 2019-01-29 NOTE — Telephone Encounter (Signed)
Attempted to call pt but unable to reach. Left message for pt to return call. 

## 2019-02-01 NOTE — Telephone Encounter (Signed)
LVM for pt to return call to scheduled first injection appt. X1  Routing message to [name] S to f/u

## 2019-02-01 NOTE — Telephone Encounter (Signed)
Pt needs to reschedule injection. TS please advise. Thank you.

## 2019-02-01 NOTE — Telephone Encounter (Signed)
I got her vm as well. I hope she'll call me back 02/02/2019. Waiting to hear from pt.[name] [name]

## 2019-02-02 ENCOUNTER — Encounter (HOSPITAL_COMMUNITY): Payer: Self-pay

## 2019-02-04 ENCOUNTER — Encounter (HOSPITAL_COMMUNITY): Payer: Self-pay

## 2019-02-04 MED ORDER — PREDNISONE 10 MG PO TABS: ORAL_TABLET | ORAL | 0 refills | Status: DC

## 2019-02-04 MED ORDER — AZITHROMYCIN 250 MG PO TABS: ORAL_TABLET | ORAL | 0 refills | Status: AC

## 2019-02-04 NOTE — Telephone Encounter (Signed)
Pt returning call cb#[phone]//kob

## 2019-02-04 NOTE — Telephone Encounter (Signed)
Dupixent came in, I called pt. Again today, she finally called me back. Pt hasn't been in for her 1st Dupixent shot because she's been sick. In talking to her, she is very SOB. She said she called for Pred. And an abx. Heather and I couldn't find that she had left this message. Please ask RM to advise. Will route to MR and [name] P..

## 2019-02-04 NOTE — Telephone Encounter (Signed)
Patient complained that she had sinus congestion drainage productive cough with thick yellow mucus and intermittent wheezing.  She has chronic asthma and has a frequent flareups.  She is supposed to be getting her new tube extend but does not want to get it while she is actively having cough and wheezing. He denies any fever, chest pain orthopnea or recent travel.  Denies any hemoptysis. Last antibiotic was greater than 3 months ago.  She says she is able to take prednisone and Z-Paks.  As she has multiple drug allergies and intolerances.

## 2019-02-04 NOTE — Telephone Encounter (Signed)
Let MR know that an urgent message was sent to him by Alroy Bailiff and he had asked if an APP could handle it as epic remote was not working for him  [name], can you please see TS's message and advise recs for pt. Thanks!

## 2019-02-04 NOTE — Telephone Encounter (Signed)
I have tried to reach pt several times, with no success. Trying again. I tried her cell ph. It went straight to vm.. I also called her home #, it just rang and rang.  I hope she'll call back to schedule an appt.. Pt. May not want to come in due to virus. Waiting for pt. To call.

## 2019-02-05 NOTE — Telephone Encounter (Signed)
Pt is needing to schedule her injection with TS.  Pt's call back is [phone] TS please advise. Thank you.

## 2019-02-08 NOTE — Telephone Encounter (Signed)
If it is okay to schedule patient, please let us know. Thanks.

## 2019-02-08 NOTE — Telephone Encounter (Signed)
I spoke with pt Fri.,she has been very sick with a sinus infection: SOB, wheezing, and sinus HA. ( It was almost closing time when I called, I didn't get a chance to put in the system.)  Pt called to say she was feeling better, she's still SOB and wheezing.(TP gave her some Pred. And an abx.) Pt. Said she'd be in for her 1st Dupixent shot hopefully sometime this week. I'll schedule pt's 2 hr. Appt. And send Epi-Pen rx when she calls back. Nothing further needed.

## 2019-02-09 ENCOUNTER — Encounter (HOSPITAL_COMMUNITY): Payer: Self-pay

## 2019-02-11 ENCOUNTER — Encounter (HOSPITAL_COMMUNITY): Payer: Self-pay

## 2019-02-16 ENCOUNTER — Encounter (HOSPITAL_COMMUNITY): Payer: Self-pay

## 2019-02-17 ENCOUNTER — Encounter: Payer: Self-pay | Admitting: Internal Medicine

## 2019-02-21 ENCOUNTER — Other Ambulatory Visit: Payer: Self-pay | Admitting: Internal Medicine

## 2019-03-11 ENCOUNTER — Ambulatory Visit: Payer: Medicare Other | Admitting: Internal Medicine

## 2019-04-08 ENCOUNTER — Encounter: Payer: Self-pay | Admitting: Gastroenterology

## 2019-04-14 ENCOUNTER — Other Ambulatory Visit: Payer: Self-pay | Admitting: Internal Medicine

## 2019-04-23 ENCOUNTER — Other Ambulatory Visit: Payer: Self-pay | Admitting: Internal Medicine

## 2019-04-28 ENCOUNTER — Other Ambulatory Visit: Payer: Self-pay | Admitting: Internal Medicine

## 2019-05-07 ENCOUNTER — Telehealth: Payer: Self-pay | Admitting: Internal Medicine

## 2019-05-11 ENCOUNTER — Telehealth: Payer: Self-pay | Admitting: Internal Medicine

## 2019-05-11 MED ORDER — BUDESONIDE-FORMOTEROL FUMARATE 160-4.5 MCG/ACT IN AERO: INHALATION_SPRAY | RESPIRATORY_TRACT | 1 refills | Status: DC

## 2019-05-11 NOTE — Telephone Encounter (Signed)
Refill sent to preferred pharmacy.  Nothing further is needed

## 2019-05-12 ENCOUNTER — Telehealth: Payer: Self-pay | Admitting: Internal Medicine

## 2019-05-12 MED ORDER — BUDESONIDE-FORMOTEROL FUMARATE 160-4.5 MCG/ACT IN AERO: INHALATION_SPRAY | RESPIRATORY_TRACT | 1 refills | Status: DC

## 2019-05-12 NOTE — Telephone Encounter (Signed)
Called [facility] [address] To verify if they received Symbicort 160 order from yesterday 05/11/2019. Pharmacy tech I spoke to stated they had not received the order. I let her know I would place the order again. Pharmacy tech verbalized understanding. Reorder has been placed.   ATC pt to let her know we were resending her prescription. Pt did not pick up at the time of the call. I have left a message for her to call our office back.

## 2019-05-12 NOTE — Telephone Encounter (Signed)
Pt currently has a 2 MONTH supply already here at our office. We do not need to set up shipment for any more medication at this time. I have let [facility] know this information. Nothing further was needed at this time.

## 2019-05-12 NOTE — Telephone Encounter (Signed)
Rx was not changed from print to normal status so it could be sent to pharmacy for pt. I have changed the status and Rx should now be able to be seen at pharmacy.  Attempted to call pt to let her know this had been fixed and Rx should be at pharmacy but unable to reach pt. Left detailed message for pt letting her know that the pharmacy should have rx now and she should be able to pick it up. Nothing further needed.

## 2019-05-13 ENCOUNTER — Telehealth: Payer: Self-pay | Admitting: Internal Medicine

## 2019-05-17 NOTE — Telephone Encounter (Signed)
Pt currently has a 2 MONTH supply already here at our office. We do not need to set up shipment for any more medication at this time. I have let [facility] know this information again. Nothing further was needed at this time.

## 2019-05-19 ENCOUNTER — Other Ambulatory Visit: Payer: Self-pay | Admitting: Internal Medicine

## 2019-05-20 ENCOUNTER — Telehealth: Payer: Self-pay | Admitting: Internal Medicine

## 2019-05-20 MED ORDER — XOPENEX HFA 45 MCG/ACT IN AERO: 1.0000 | INHALATION_SPRAY | Freq: Four times a day (QID) | RESPIRATORY_TRACT | 5 refills | Status: DC | PRN

## 2019-05-20 MED ORDER — LEVALBUTEROL TARTRATE 45 MCG/ACT IN AERO: 1.0000 | INHALATION_SPRAY | Freq: Four times a day (QID) | RESPIRATORY_TRACT | 5 refills | Status: DC | PRN

## 2019-05-20 NOTE — Telephone Encounter (Signed)
Refill for Xopenex inhaler has been sent to pt's preferred pharmacy. Attempted to call pt to let her know this had been done but unable to reach pt. Left detailed message on pt's machine letting her know this had been done. Nothing further needed.

## 2019-05-20 NOTE — Telephone Encounter (Signed)
I have resent the pt's Xopenex prescription sent in with the Dispense as Written box checked to ensure the brand name is dispensed. I left a detailed message on the pt's named voicemail making her aware of this. Nothing further was needed.

## 2019-05-25 ENCOUNTER — Other Ambulatory Visit: Payer: Self-pay | Admitting: Internal Medicine

## 2019-06-16 ENCOUNTER — Other Ambulatory Visit: Payer: Self-pay | Admitting: Internal Medicine

## 2019-07-09 ENCOUNTER — Other Ambulatory Visit: Payer: Self-pay | Admitting: Internal Medicine

## 2019-08-02 ENCOUNTER — Other Ambulatory Visit: Payer: Self-pay | Admitting: Internal Medicine

## 2019-08-30 ENCOUNTER — Telehealth: Payer: Self-pay

## 2019-08-30 NOTE — Telephone Encounter (Signed)
Called to get refaxed to side a fax machine

## 2019-08-30 NOTE — Telephone Encounter (Signed)
Copied from [place] [id number]. Topic: General - Inquiry >> Aug 30, 2019  9:32 AM Rutherford Nail, NT wrote: Reason for CRM: Nite with [facility] calling to check status of getting paperwork complete for patient's disposable masks. States that they faxed it before and received a response, but there was incomplete areas on the paperwork. States that she faxed over what she needed completed on 08/16/2019. Please advise.  CB#: [phone]

## 2019-09-08 ENCOUNTER — Other Ambulatory Visit: Payer: Self-pay | Admitting: Internal Medicine

## 2019-10-06 ENCOUNTER — Other Ambulatory Visit: Payer: Self-pay | Admitting: Internal Medicine

## 2019-10-18 ENCOUNTER — Other Ambulatory Visit: Payer: Self-pay | Admitting: Internal Medicine

## 2019-10-18 ENCOUNTER — Ambulatory Visit (INDEPENDENT_AMBULATORY_CARE_PROVIDER_SITE_OTHER): Payer: Medicare Other | Admitting: Internal Medicine

## 2019-10-18 ENCOUNTER — Encounter: Payer: Self-pay | Admitting: Internal Medicine

## 2019-10-18 DIAGNOSIS — F419 Anxiety disorder, unspecified: Secondary | ICD-10-CM

## 2019-10-18 DIAGNOSIS — I10 Essential (primary) hypertension: Secondary | ICD-10-CM | POA: Diagnosis not present

## 2019-10-18 MED ORDER — MONTELUKAST SODIUM 10 MG PO TABS: 10.0000 mg | ORAL_TABLET | Freq: Every day | ORAL | 3 refills | Status: DC

## 2019-10-18 MED ORDER — NEXIUM 40 MG PO CPDR: 40.0000 mg | DELAYED_RELEASE_CAPSULE | Freq: Every day | ORAL | 3 refills | Status: DC

## 2019-10-18 MED ORDER — DESLORATADINE 5 MG PO TABS: 5.0000 mg | ORAL_TABLET | Freq: Every day | ORAL | 3 refills | Status: DC

## 2019-10-18 MED ORDER — AMLODIPINE BESYLATE 5 MG PO TABS: 5.0000 mg | ORAL_TABLET | Freq: Every day | ORAL | 1 refills | Status: DC

## 2019-10-18 MED ORDER — ALPRAZOLAM 0.25 MG PO TABS: ORAL_TABLET | ORAL | 3 refills | Status: DC

## 2019-10-18 NOTE — Progress Notes (Signed)
Virtual Visit via Audio Note  I connected with [name] [name] on 10/18/19 at  3:40 PM EST by an audio-only enabled telemedicine application and verified that I am speaking with the correct person using two identifiers.  The patient and the provider were at separate locations throughout the entire encounter.   I discussed the limitations of evaluation and management by telemedicine and the availability of in person appointments. The patient expressed understanding and agreed to proceed. The patient and the provider were the only parties present for the visit unless noted in HPI below.  History of Present Illness: The patient is a 78 y.o. female with visit for needing medication refills as not seen in about 18 months. She does not want to come in given covid and her severe asthma. She has been off xolair for some time and was supposed to start a different injection. This never happened due to covid-19 she did not want to go anywhere. She is doing okay with breathing using albuterol about the same as normal. Skin is now not breaking up since stopping xolair injections and she is not sure she wants to start another injection. Denies new concerns.   Observations/Objective: A and O times 3, no coughing or dyspnea during visit, voice strong  Assessment and Plan: See problem oriented charting  Follow Up Instructions: refill meds, needs in person visit around 6 months from now.   Visit time 13 minutes: that time was spent in non-face to face counseling and coordination of care with the patient: counseled about as above  I discussed the assessment and treatment plan with the patient. The patient was provided an opportunity to ask questions and all were answered. The patient agreed with the plan and demonstrated an understanding of the instructions.   The patient was advised to call back or seek an in-person evaluation if the symptoms worsen or if the condition fails to improve as anticipated.  Hoyt Koch, MD

## 2019-10-19 ENCOUNTER — Ambulatory Visit (INDEPENDENT_AMBULATORY_CARE_PROVIDER_SITE_OTHER): Payer: Medicare Other | Admitting: *Deleted

## 2019-10-19 ENCOUNTER — Telehealth: Payer: Self-pay | Admitting: *Deleted

## 2019-10-19 DIAGNOSIS — Z0000 Encounter for general adult medical examination without abnormal findings: Secondary | ICD-10-CM

## 2019-10-19 NOTE — Telephone Encounter (Signed)
These medications were refilled exactly as they have been sent in for some years. She can ask pharmacy to buy brand name only.
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
## 2019-10-19 NOTE — Progress Notes (Signed)
Medical screening examination/treatment/procedure(s) were performed by non-physician practitioner and as supervising physician I was immediately available for consultation/collaboration. I agree with above. Shimika Ames A Lissy Deuser, MD 

## 2019-10-19 NOTE — Assessment & Plan Note (Signed)
Refill xanax and reminded about the potential side effects of memory change, balance problems. She wishes to continue given increase in QOL.

## 2019-10-19 NOTE — Telephone Encounter (Signed)
During AWV, patient stated that the refills that the PCP ordered for her 10/18/19 were generic. Patient states that she does not tolerate generic medications, she has reactions and it makes her asthma flare up. Patient would like to have only name brand prescription refills sent back into the pharmacy. Nurse did educate patient that this could be a lot more expensive, patient verbalized understanding.

## 2019-10-19 NOTE — Assessment & Plan Note (Signed)
She states BP okay at home. Will need in person visit with labs in 6 months to continue with refills.

## 2019-10-20 NOTE — Telephone Encounter (Signed)
Tried calling for some reason in kept doing a busy tone. wil route to [facility] incase patient calls back

## 2019-10-21 ENCOUNTER — Other Ambulatory Visit: Payer: Self-pay

## 2019-10-21 MED ORDER — MONTELUKAST SODIUM 10 MG PO TABS: 10.0000 mg | ORAL_TABLET | Freq: Every day | ORAL | 1 refills | Status: DC

## 2019-10-21 MED ORDER — DESLORATADINE 5 MG PO TABS: 5.0000 mg | ORAL_TABLET | Freq: Every day | ORAL | 1 refills | Status: DC

## 2019-10-21 MED ORDER — AMLODIPINE BESYLATE 5 MG PO TABS: 5.0000 mg | ORAL_TABLET | Freq: Every day | ORAL | 1 refills | Status: DC

## 2019-10-21 NOTE — Telephone Encounter (Signed)
Called patient and LVM to notify her that prescriptions have been sent to her pharmacy. Nurse asked patient to call her back for any further questions or concerns.

## 2019-10-26 ENCOUNTER — Other Ambulatory Visit: Payer: Self-pay

## 2019-10-26 ENCOUNTER — Ambulatory Visit (INDEPENDENT_AMBULATORY_CARE_PROVIDER_SITE_OTHER): Payer: Medicare Other | Admitting: Adult Health

## 2019-10-26 ENCOUNTER — Encounter: Payer: Self-pay | Admitting: Adult Health

## 2019-10-26 DIAGNOSIS — J4550 Severe persistent asthma, uncomplicated: Secondary | ICD-10-CM

## 2019-10-26 MED ORDER — PREDNISONE 10 MG PO TABS: ORAL_TABLET | ORAL | 0 refills | Status: DC

## 2019-10-26 NOTE — Patient Instructions (Signed)
Prednisone taper over the next week Restart Clarinex daily Restart Singulair daily Continue on Symbicort 2 puffs twice daily, rinse after use Albuterol as needed for wheezing shortness of breath Follow-up with [name] in 3 months and as needed Please contact office for sooner follow up if symptoms do not improve or worsen or seek emergency care

## 2019-10-26 NOTE — Progress Notes (Signed)
Virtual Visit via Telephone Note  I connected with [name] [name] on 10/26/19 at  1:30 PM EST by telephone and verified that I am speaking with the correct person using two identifiers.  Location: Patient: Home  Provider: Office    I discussed the limitations, risks, security and privacy concerns of performing an evaluation and management service by telephone and the availability of in person appointments. I also discussed with the patient that there may be a patient responsible charge related to this service. The patient expressed understanding and agreed to proceed.   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 Previously on Xolair (last injection was 11/2018 )   Today's televisit is a follow up for Severe persistent asthma .  Patient was last seen in January 2020.  Patient is on Symbicort twice daily.  She was given Spiriva however has been intolerant.  She was previously on Xolair injections but stopped these in January.  She declines coming into the office due to the pandemic.  She was also recommended to change from Xolair to [place] and was approved but declines these injections as well.  Patient says overall breathing has been doing fairly well she has been on prednisone once or twice since January.  Patient complains over the last 2 days that she has had some postnasal drainage and dry cough with wheezing.  Feels that her asthma started to flare is requesting a prednisone taper.  She denies any fever, discolored mucus, chest pain, orthopnea, loss of taste or smell.  Patient was previously on Singulair and Clarinex but has ran out of this prescription.   Patient Active Problem List   Diagnosis Date Noted  . Acute pain of right knee 12/25/2016  . Pain of left hip joint 12/25/2016  . Moderate persistent asthma 05/17/2016  . Eosinophilia 03/31/2015  . Lung nodules 01/02/2015   . Disorder of left rotator cuff 10/24/2014  . Benign neoplasm of colon 03/29/2014  . GERD (gastroesophageal reflux disease) 03/11/2014  . Vitamin D deficiency 09/16/2013  . Pain management 05/22/2013  . Coronary artery calcification 04/10/2013  . Depression, major, recurrent ([place]) 10/12/2012  . Routine health maintenance 07/26/2012  . Obesity (BMI 30.0-34.9) 04/12/2012  . Bradycardia 02/18/2012  . Chronic renal insufficiency, stage II (mild) 10/30/2011  . Hypertension 10/25/2011  . Gastroparesis 07/22/2011  . Asthma, severe persistent 05/05/2011  . Pulmonary nodule, right 05/05/2011  . ALLERGY, FOOD 07/10/2010  . Anxiety 06/18/2010  . COLONIC POLYPS, HYPERPLASTIC, HX OF 04/13/2008  . OSA (obstructive sleep apnea) 03/06/2008  . Allergic rhinitis 02/22/2008  . Hyperlipidemia 01/08/2008  . OSTEOPOROSIS 05/30/2007   Current Outpatient Medications on File Prior to Visit  Medication Sig Dispense Refill  . ALPRAZolam (XANAX) 0.25 MG tablet TAKE 1 TABLET BY MOUTH EVERY DAY AS NEEDED FOR ANXIETY 30 tablet 3  . amLODipine (NORVASC) 5 MG tablet Take 1 tablet (5 mg total) by mouth daily. 90 tablet 1  . budesonide-formoterol (SYMBICORT) 160-4.5 MCG/ACT inhaler TAKE 2 PUFFS BY MOUTH TWICE A DAY 30.6 Inhaler 1  . [facility] D3 50 MCG (2000 UT) CAPS TAKE 1 CAPSULE BY MOUTH EVERY DAY 90 capsule 1  . desloratadine (CLARINEX) 5 MG tablet Take 1 tablet (5 mg total) by mouth daily. 90 tablet 1  . Dupilumab (DUPIXENT) 300 MG/2ML SOSY Inject 300 mg into the skin every 14 (fourteen) days. 2 Syringe 6  . EPINEPHrine 0.3 mg/0.3 mL IJ  SOAJ injection INJECT 0.3 MLS INTO THE MUSCLE ONCE FOR 1 DOSE.  0  . EPINEPHRINE 0.3 mg/0.3 mL IJ SOAJ injection INJECT 0.3 MLS INTO THE MUSCLE ONCE FOR 1 DOSE. 2 each 0  . montelukast (SINGULAIR) 10 MG tablet Take 1 tablet (10 mg total) by mouth at bedtime. 90 tablet 1  . NEXIUM 40 MG capsule Take 1 capsule (40 mg total) by mouth daily at 12 noon. 90 capsule 3  . PAZEO 0.7 % SOLN      . SPIRIVA RESPIMAT 2.5 MCG/ACT AERS INHALE 2 PUFFS BY MOUTH INTO THE LUNGS DAILY 3 Inhaler 1  . XOPENEX 0.63 MG/3ML nebulizer solution Take 3 mLs (0.63 mg total) by nebulization every 6 (six) hours as needed for wheezing or shortness of breath. 360 mL 5  . XOPENEX HFA 45 MCG/ACT inhaler INHALE 1 TO 2 PUFFS BY MOUTH EVERY 6 HOURS AS NEEDED FOR WHEEZE 15 Inhaler 5   Current Facility-Administered Medications on File Prior to Visit  Medication Dose Route Frequency Provider Last Rate Last Admin  . omalizumab Arvid Right) injection 150 mg  150 mg Subcutaneous Q14 Days Brand Males, MD   150 mg at 10/27/18 1134    Observations/Objective: CT sinus December 2017 pansinusitis.  Assessment and Plan: Acute asthma exacerbation with URI.  Patient with symptoms only for 1 to 2 days.  We will treat with a short course of prednisone over the next week.  Restart Clarinex and Singulair.  Encouraged on compliance with Symbicort 2 puffs twice daily.  Patient is had multiple drug intolerances.  And documented medication noncompliance.  Patient is encouraged on maintenance regimen.  Previously on Xolair.  But stopped this due to the pandemic.  She also has been changed over to [place] but did not begin this as above.  Have encouraged her on return to the office to reconsider starting her biologic therapy.  Plan  Patient Instructions  Prednisone taper over the next week Restart Clarinex daily Restart Singulair daily Continue on Symbicort 2 puffs twice daily, rinse after use Albuterol as needed for wheezing shortness of breath Follow-up with [name] in 3 months and as needed Please contact office for sooner follow up if symptoms do not improve or worsen or seek emergency care        Follow Up Instructions: Follow-up in 3 months and as needed  Please contact office for sooner follow up if symptoms do not improve or worsen or seek emergency care     I discussed the assessment and treatment plan  with the patient. The patient was provided an opportunity to ask questions and all were answered. The patient agreed with the plan and demonstrated an understanding of the instructions.   The patient was advised to call back or seek an in-person evaluation if the symptoms worsen or if the condition fails to improve as anticipated.  I provided 22  minutes of non-face-to-face time during this encounter.   Rexene Edison, NP

## 2019-10-29 ENCOUNTER — Telehealth: Payer: Self-pay | Admitting: Adult Health

## 2019-10-29 NOTE — Telephone Encounter (Signed)
Left message for patient to call back  

## 2019-11-01 NOTE — Telephone Encounter (Signed)
ATC pt, no answer. Left message for pt to call back.  

## 2019-11-02 NOTE — Telephone Encounter (Signed)
Attempted to call pt but line went straight to VM. Left message for pt to return call. Due to multiple attempts trying to reach pt and unable to do so, per triage protocol encounter will be closed.

## 2019-11-16 ENCOUNTER — Telehealth: Payer: Self-pay

## 2019-11-16 NOTE — Telephone Encounter (Signed)
Noted   Copied from [place] [id number]. Topic: General - Other >> Nov 16, 2019  9:27 AM Burchel, Abbi R wrote: Reason for CRM: [name] [name] ([facility]) [phone]  [name] [name] states she will be re-faxing paperwork re:pt's incontinence supplies. Please review, fill out, and return.

## 2019-11-24 ENCOUNTER — Telehealth: Payer: Self-pay

## 2019-11-24 NOTE — Telephone Encounter (Signed)
Copied from [place] [id number]. Topic: General - Other >> Nov 24, 2019  2:18 PM Rainey Pines A wrote: Laverta Baltimore from [employer] calling to get status update on paperwork that was faxed on 11/16/2019 for patient incontinence supplies. Laverta Baltimore can be reached at [phone]

## 2019-11-25 NOTE — Telephone Encounter (Signed)
No call them and have them re-fax to the fax by you to make sure you get it. This happens sometimes with those forms

## 2019-11-25 NOTE — Telephone Encounter (Signed)
Called Nita, LVM providing the fax number for our area.

## 2019-11-29 NOTE — Telephone Encounter (Signed)
Forms have been completed by PCP and faxed back.

## 2020-01-31 ENCOUNTER — Other Ambulatory Visit: Payer: Self-pay | Admitting: Internal Medicine

## 2020-03-29 ENCOUNTER — Ambulatory Visit: Payer: Medicare Other | Admitting: Internal Medicine

## 2020-04-06 ENCOUNTER — Other Ambulatory Visit (INDEPENDENT_AMBULATORY_CARE_PROVIDER_SITE_OTHER): Payer: Medicare Other

## 2020-04-06 ENCOUNTER — Ambulatory Visit (INDEPENDENT_AMBULATORY_CARE_PROVIDER_SITE_OTHER): Payer: Medicare Other | Admitting: Internal Medicine

## 2020-04-06 ENCOUNTER — Other Ambulatory Visit: Payer: Self-pay

## 2020-04-06 VITALS — BP 126/80 | HR 72 | Temp 97.9°F | Resp 18 | Ht 63.5 in | Wt 171.2 lb

## 2020-04-06 DIAGNOSIS — J4550 Severe persistent asthma, uncomplicated: Secondary | ICD-10-CM

## 2020-04-06 DIAGNOSIS — R7303 Prediabetes: Secondary | ICD-10-CM | POA: Diagnosis not present

## 2020-04-06 DIAGNOSIS — Z7189 Other specified counseling: Secondary | ICD-10-CM

## 2020-04-06 DIAGNOSIS — Z0000 Encounter for general adult medical examination without abnormal findings: Secondary | ICD-10-CM | POA: Diagnosis not present

## 2020-04-06 DIAGNOSIS — E559 Vitamin D deficiency, unspecified: Secondary | ICD-10-CM

## 2020-04-06 DIAGNOSIS — N189 Chronic kidney disease, unspecified: Secondary | ICD-10-CM | POA: Diagnosis not present

## 2020-04-06 DIAGNOSIS — Z7185 Encounter for immunization safety counseling: Secondary | ICD-10-CM

## 2020-04-06 LAB — CBC WITH DIFFERENTIAL/PLATELET
Basophils Absolute: 0.1 10*3/uL (ref 0.0–0.1)
Basophils Relative: 1.3 % (ref 0.0–3.0)
Eosinophils Absolute: 1 10*3/uL — ABNORMAL HIGH (ref 0.0–0.7)
Eosinophils Relative: 14.5 % — ABNORMAL HIGH (ref 0.0–5.0)
HCT: 37.1 % (ref 36.0–46.0)
Hemoglobin: 12.1 g/dL (ref 12.0–15.0)
Lymphocytes Relative: 27.8 % (ref 12.0–46.0)
Lymphs Abs: 1.8 10*3/uL (ref 0.7–4.0)
MCHC: 32.6 g/dL (ref 30.0–36.0)
MCV: 86.8 fl (ref 78.0–100.0)
Monocytes Absolute: 0.7 10*3/uL (ref 0.1–1.0)
Monocytes Relative: 10 % (ref 3.0–12.0)
Neutro Abs: 3.1 10*3/uL (ref 1.4–7.7)
Neutrophils Relative %: 46.4 % (ref 43.0–77.0)
Platelets: 201 10*3/uL (ref 150.0–400.0)
RBC: 4.27 Mil/uL (ref 3.87–5.11)
RDW: 13.4 % (ref 11.5–15.5)
WBC: 6.6 10*3/uL (ref 4.0–10.5)

## 2020-04-06 LAB — HEMOGLOBIN A1C: Hgb A1c MFr Bld: 5.7 % (ref 4.6–6.5)

## 2020-04-06 LAB — HEPATIC FUNCTION PANEL
ALT: 12 U/L (ref 0–35)
AST: 20 U/L (ref 0–37)
Albumin: 4 g/dL (ref 3.5–5.2)
Alkaline Phosphatase: 73 U/L (ref 39–117)
Bilirubin, Direct: 0.1 mg/dL (ref 0.0–0.3)
Total Bilirubin: 0.6 mg/dL (ref 0.2–1.2)
Total Protein: 7.5 g/dL (ref 6.0–8.3)

## 2020-04-06 LAB — T4, FREE: Free T4: 0.93 ng/dL (ref 0.60–1.60)

## 2020-04-06 LAB — VITAMIN D 25 HYDROXY (VIT D DEFICIENCY, FRACTURES): VITD: 30.57 ng/mL (ref 30.00–100.00)

## 2020-04-06 LAB — TSH: TSH: 0.73 u[IU]/mL (ref 0.35–4.50)

## 2020-04-06 MED ORDER — AZITHROMYCIN 250 MG PO TABS
ORAL_TABLET | ORAL | 0 refills | Status: DC
Start: 2020-04-06 — End: 2020-05-01

## 2020-04-06 MED ORDER — SPIRIVA RESPIMAT 2.5 MCG/ACT IN AERS
2.0000 | INHALATION_SPRAY | Freq: Every day | RESPIRATORY_TRACT | 0 refills | Status: DC
Start: 2020-04-06 — End: 2020-10-02

## 2020-04-06 MED ORDER — PREDNISONE 10 MG PO TABS
ORAL_TABLET | ORAL | 0 refills | Status: DC
Start: 2020-04-06 — End: 2020-05-01
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
## 2020-04-06 NOTE — Patient Instructions (Addendum)
Severe persistent asthma with acute exacerbation Severe persistent asthma without complication Eosinophilia  - currently stable and you seem to be doing well without a biologic because of social distancing, masking and isolation  Plan - - continue spiriva - continue symbicort daily -Hold off initiating any biologic therapy given improvement with pandemic related isolation  - check cbc with diff, blood IgE -For future flareup take a prescription  handy  - Please take prednisone 40 mg x1 day, then 30 mg x1 day, then 20 mg x1 day, then 10 mg x1 day, and then 5 mg x1 day and stop   -Take 1 prescription of azithromycin handy for the future  Vaccination counseling  -Respect refusal to take flu shot a Covid shot because of allergy history -Continue masking and please note there are antibodies available if you get disease of Covid.  General health.  cKD  -At your request please check blood CBC with differential, chemistry, liver function test, vitamin D level for vitamin D deficiency, hemoglobin A1c, TSH and free T4  Follow-up -6 months or sooner with [name]

## 2020-04-06 NOTE — Progress Notes (Signed)
All labs fine except high eosinophils which is well knowh for her due to her asthma

## 2020-04-07 ENCOUNTER — Telehealth: Payer: Self-pay | Admitting: Internal Medicine

## 2020-04-07 DIAGNOSIS — D7219 Other eosinophilia: Secondary | ICD-10-CM

## 2020-04-07 DIAGNOSIS — J479 Bronchiectasis, uncomplicated: Secondary | ICD-10-CM

## 2020-04-07 DIAGNOSIS — J4550 Severe persistent asthma, uncomplicated: Secondary | ICD-10-CM

## 2020-04-07 LAB — IGE: IgE (Immunoglobulin E), Serum: 1427 kU/L — ABNORMAL HIGH (ref <=114)

## 2020-04-07 NOTE — Telephone Encounter (Signed)
Patient returning call.  [phone]

## 2020-04-07 NOTE — Telephone Encounter (Signed)
Called and spoke with patient. She verbalized understanding of results. Will go ahead and place order for CT scan.   Nothing further needed at time of call.

## 2020-04-07 NOTE — Telephone Encounter (Signed)
Blood IgE is extremely high greater than 1000.  In addition eosinophils are high.  Otherwise labs are okay.  Plan -She needs a high-resolution CT chest to ensure there is no ABPA or bronchiectasis from her bad asthma.  This is because her IgE is so high and it could be damaging her lung tissue.  Last CT scan of the chest was in 2016.  She needs to have this sometime in the next few to several weeks

## 2020-04-14 NOTE — Progress Notes (Signed)
Left VM requesting a return call to get her lab results.

## 2020-04-15 ENCOUNTER — Other Ambulatory Visit: Payer: Self-pay | Admitting: Internal Medicine

## 2020-04-19 ENCOUNTER — Other Ambulatory Visit: Payer: Self-pay | Admitting: Internal Medicine

## 2020-04-19 ENCOUNTER — Telehealth: Payer: Self-pay | Admitting: Internal Medicine

## 2020-04-19 NOTE — Telephone Encounter (Signed)
Advised pt of results. Pt understood and nothing further is needed.     Brand Males, MD  04/06/2020 6:11 PM EDT    All labs fine except high eosinophils which is well knowh for her due to her asthma

## 2020-04-26 ENCOUNTER — Other Ambulatory Visit: Payer: Self-pay

## 2020-04-26 ENCOUNTER — Ambulatory Visit
Admission: RE | Admit: 2020-04-26 | Discharge: 2020-04-26 | Disposition: A | Discharge status: Home / Self Care | Payer: Medicare Other | Source: Ambulatory Visit | Attending: Internal Medicine | Admitting: Internal Medicine

## 2020-04-26 DIAGNOSIS — J479 Bronchiectasis, uncomplicated: Secondary | ICD-10-CM

## 2020-05-01 ENCOUNTER — Ambulatory Visit (INDEPENDENT_AMBULATORY_CARE_PROVIDER_SITE_OTHER): Payer: Medicare Other | Admitting: Internal Medicine

## 2020-05-01 ENCOUNTER — Other Ambulatory Visit: Payer: Self-pay

## 2020-05-01 ENCOUNTER — Encounter: Payer: Self-pay | Admitting: Internal Medicine

## 2020-05-01 ENCOUNTER — Telehealth: Payer: Self-pay | Admitting: Internal Medicine

## 2020-05-01 VITALS — BP 144/90 | HR 56 | Temp 97.9°F | Ht 63.5 in | Wt 170.0 lb

## 2020-05-01 DIAGNOSIS — E041 Nontoxic single thyroid nodule: Secondary | ICD-10-CM | POA: Diagnosis not present

## 2020-05-01 DIAGNOSIS — E782 Mixed hyperlipidemia: Secondary | ICD-10-CM | POA: Diagnosis not present

## 2020-05-01 DIAGNOSIS — F339 Major depressive disorder, recurrent, unspecified: Secondary | ICD-10-CM | POA: Diagnosis not present

## 2020-05-01 DIAGNOSIS — J4550 Severe persistent asthma, uncomplicated: Secondary | ICD-10-CM

## 2020-05-01 NOTE — Telephone Encounter (Signed)
Patient call returned. She is upset she did not get results from her CT scan on 04/26/2020. This scan showed thyroid nodules and her PCP ordered a thyroid ultrasound today. [name] is out of the office until 6/28. Could you advise triage on the results of her CT scan and how to proceed?

## 2020-05-01 NOTE — Telephone Encounter (Signed)
Please let her know CT showed no evidence of pulmonary fibrosis. Mild emphysema. Bilateral pulmonary nodules measure unchanged and considered benign. Small areas of haziness in lung likely infectious or inflammatory in etiology. There was air trapping is indicative of small airways disease. Bilateral thyroid nodules. Recommend thyroid US.

## 2020-05-01 NOTE — Telephone Encounter (Signed)
New message:   Pt is calling and states she would like to know why she is being referred to get a Thyroid ultrasound done. Please advise.

## 2020-05-01 NOTE — Progress Notes (Signed)
   Subjective:   Patient ID: [name] [name], female    DOB: [birth year], 79 y.o.   MRN: [id number]  HPI The patient is a 79 YO female coming in for several concerns about her insurance coverage (she is upset as she has not gotten money for getting her wellness visits for the last 2 years and she called medicare and they told her that since we are part of an [place] she needs to go through [place] for that, she has also called [employer] and wants to get into their chronic disease management plan and they told her that we need to initiate that for her, and also transportation she called thn and they told her that her pcp would have to arrange and they could do nothing to help her) and asthma (seeing pulmonary, overall worsening over the years, recent CT high resolution to rule out ILD, overall stable in the last few weeks, not using rescue more often, SOB limits her life quite a lot overall) and anxiety (she does use rare xanax for this, denies SI/HI, does not want to take any more medications as she has a lot of side effects/allergies with medications and does not want to try anything).   Review of Systems  Constitutional: Negative.   HENT: Negative.   Eyes: Negative.   Respiratory: Positive for cough and shortness of breath. Negative for chest tightness.   Cardiovascular: Negative for chest pain, palpitations and leg swelling.  Gastrointestinal: Negative for abdominal distention, abdominal pain, constipation, diarrhea, nausea and vomiting.  Musculoskeletal: Positive for arthralgias and myalgias.  Skin: Negative.   Neurological: Negative.   Psychiatric/Behavioral: Negative.     Objective:  Physical Exam Constitutional:      Appearance: She is well-developed.  HENT:     Head: Normocephalic and atraumatic.  Cardiovascular:     Rate and Rhythm: Normal rate and regular rhythm.  Pulmonary:     Effort: Pulmonary effort is normal. No respiratory distress.     Breath sounds: Wheezing present. No rales.  Abdominal:      General: Bowel sounds are normal. There is no distension.     Palpations: Abdomen is soft.     Tenderness: There is no abdominal tenderness. There is no rebound.  Musculoskeletal:     Cervical back: Normal range of motion.  Skin:    General: Skin is warm and dry.  Neurological:     Mental Status: She is alert and oriented to person, place, and time.     Coordination: Coordination normal.     Vitals:   05/01/20 0932  BP: (!) 144/90  Pulse: (!) 56  Temp: 97.9 F (36.6 C)  TempSrc: Oral  SpO2: 98%  Weight: 170 lb (77.1 kg)  Height: 5' 3.5" (1.613 m)    This visit occurred during the SARS-CoV-2 public health emergency.  Safety protocols were in place, including screening questions prior to the visit, additional usage of staff PPE, and extensive cleaning of exam room while observing appropriate contact time as indicated for disinfecting solutions.   Assessment & Plan:  Visit time 25 minutes in face to face communication with patient and coordination of care, additional 5 minutes spent in record review, coordination or care, ordering tests, communicating/referring to other healthcare professionals, documenting in medical records all on the same day of the visit for total time 30 minutes spent on the visit.

## 2020-05-01 NOTE — Telephone Encounter (Signed)
Pt has been informed and expressed understanding.  

## 2020-05-01 NOTE — Telephone Encounter (Signed)
Attempted to contact pt, phone only rings, no VM.

## 2020-05-01 NOTE — Assessment & Plan Note (Signed)
Not on meds and she would like to defer labs today given recent labs without lipid panel. No meds.

## 2020-05-01 NOTE — Telephone Encounter (Signed)
Attempted to call pt but unable to reach. Left message for her to return call. 

## 2020-05-01 NOTE — Assessment & Plan Note (Signed)
Denies wanting to take anything for this. Uses xanax 0.25 mg daily prn. Advised coping skills and counseling and she will work on these.

## 2020-05-01 NOTE — Telephone Encounter (Signed)
We got a note from her lung doctor that her recent lung scan did show an incidental nodule in the thyroid and recommended a specific US of the thyroid so we ordered this. I meant to mention this to her during visit my apologies.

## 2020-05-01 NOTE — Assessment & Plan Note (Signed)
Recent CT high resolution with need for US thyroid which was ordered today. Recent thyroid levels are normal in May 2021 so no labs indicated.

## 2020-05-01 NOTE — Telephone Encounter (Signed)
Please advise. AVS stated thyroid numbers were normal but [place] was ordered.

## 2020-05-01 NOTE — Patient Instructions (Signed)
We will not check any labs today. 

## 2020-05-02 NOTE — Telephone Encounter (Signed)
Patient is returning phone call. Patient phone number is [phone].

## 2020-05-02 NOTE — Telephone Encounter (Signed)
Patient called with results of CT scan. Patient verbalized understanding of results and plan for her to have thyroid ultrasound. She reports the scan is scheduled.

## 2020-05-02 NOTE — Telephone Encounter (Signed)
LMTCB x2 for pt 

## 2020-05-03 ENCOUNTER — Other Ambulatory Visit: Payer: Self-pay

## 2020-05-03 NOTE — Patient Outreach (Signed)
[place] [facility]) Care Management  05/03/2020  [name] [name] 08/17/1941 [id number]   Placed call to patient, and reviewed reason for call. Patient reports that she has asthma and is need of some assistance.    (1) needs help with future transportation. Reports she use to ride [employer] and now does not feel like she can.   Has an appointment for 05/10/2020 for thyroid ultrasound.  Has a ride to this appointment. Will send a social work referral. (2) no longer taking spirivia due to cost. Will send a pharmacy referral (3) wants to know how to get reimbursed for her wellness exams. Will inquire with [facility] leadership (4) interested in pulmonary rehab. Will send in basket to [name].   Patient reports to me that she has on and off problems with her asthma. Has a nebulizer at home that she uses.  Quit smoking in her 20's.  Reports she is not taking her sprivia due to cost and MD is aware. States she has all her other medications and takes them as prescribed.  Reports recent finding of a thyroid nodule and is pending follow up ultrasound. Patient reports that she felt better when she was able to go to pulmonary rehab and is interested in going back to rehab.  Patient reports her daughter is a Marine scientist but is very busy.   PLAN: patient is knowledgeable about her chronic disease and has no questions on self management at this time. Will follow up with care coordination issues as listed above and place referrals.  Will follow up with patient after info is obtained. Will place referrals an send in basket message to MD.  Tomasa Rand, RN, BSN, CEN [place] Coordinator [phone].

## 2020-05-04 NOTE — Patient Outreach (Signed)
Referral from Tomasa Rand, RN to [email or web address] - Per note, does not have sprivia and is having severe asthma issues.

## 2020-05-10 ENCOUNTER — Ambulatory Visit
Admission: RE | Admit: 2020-05-10 | Discharge: 2020-05-10 | Disposition: A | Discharge status: Home / Self Care | Payer: Medicare Other | Source: Ambulatory Visit | Attending: Internal Medicine | Admitting: Internal Medicine

## 2020-05-10 DIAGNOSIS — E041 Nontoxic single thyroid nodule: Secondary | ICD-10-CM

## 2020-05-11 ENCOUNTER — Other Ambulatory Visit: Payer: Self-pay | Admitting: *Deleted

## 2020-05-11 ENCOUNTER — Other Ambulatory Visit: Payer: Self-pay | Admitting: Internal Medicine

## 2020-05-11 ENCOUNTER — Telehealth: Payer: Self-pay

## 2020-05-11 DIAGNOSIS — E041 Nontoxic single thyroid nodule: Secondary | ICD-10-CM

## 2020-05-11 NOTE — Telephone Encounter (Signed)
New message    Calling for test results  

## 2020-05-11 NOTE — Patient Outreach (Signed)
[place] [facility]) Care Management  05/11/2020  HARLEYQUINN GASSER [birth year] [id number]  CSW received referral on 05/04/2020 for transportation needs.  CSW attempted initial outreach to pt on 05/10/2020 and was unable to reach pt however did leave a HIPPA compliant voice message for pt.    CSW will send pt an Unsuccessful Outreach letter and try again per policy.   Eduard Clos, MSW, [place] Worker  [place] [phone]

## 2020-05-16 ENCOUNTER — Ambulatory Visit: Payer: Self-pay | Admitting: *Deleted

## 2020-05-17 ENCOUNTER — Other Ambulatory Visit: Payer: Self-pay

## 2020-05-18 ENCOUNTER — Other Ambulatory Visit: Payer: Self-pay | Admitting: *Deleted

## 2020-05-18 NOTE — Patient Outreach (Signed)
[facility]) Care Management  05/18/2020  MAYBREE RILING May 09, 1941 [id number]  CSW was able to make initial contact with pt today by phone. CSW confirmed pt's identity and introduced self, role and reason for call; referral for transportation assistance.  Per pt, she does have Medicaid.  CSW advised pt of Medicaid transportation for medical appointments to which pt reports she is aware of but "can't ride their bus".  CSW asked pt for more information on what her specific needs are and why she cannot use DSS transport; "I have asthma and need one on one rides".  CSW uncertain of the options available through DSS for one on one rides and offered to investigate.  Pt reports she has other needs to discuss which included wanting to participate in our [facility] program "for chronic conditions" as it mentions in our brochure. Pt specifically wants this for her "asthma".  CSW offered to discuss this further with Tomasa Rand, RN, [facility] RNCM, to which pt became upset; stating that "everybody is passing the buck".  CSW attempted to redirect pt and to explain our roles to which pt hung up the phone.  CSW has updated [facility] RNCM of above; asking her to call pt to further attempt to discuss the asthma chronic condition support that may be able to be offered.   CSW will await further updates from [place] and plan accordingly as appropriate.   Eduard Clos, MSW, [place] Worker  [place] [phone]

## 2020-05-19 ENCOUNTER — Ambulatory Visit: Payer: Self-pay | Admitting: *Deleted

## 2020-05-24 ENCOUNTER — Ambulatory Visit: Payer: Self-pay

## 2020-05-25 ENCOUNTER — Ambulatory Visit: Payer: Self-pay

## 2020-05-26 ENCOUNTER — Other Ambulatory Visit: Payer: Self-pay

## 2020-05-26 ENCOUNTER — Ambulatory Visit: Payer: Self-pay | Admitting: *Deleted

## 2020-05-26 NOTE — Patient Outreach (Signed)
[place] [facility]) Care Management  05/26/2020  [name] [name] [birth year] [id number]   Telephone assessment: Referral from [facility] social worker for asthma assessment and education.  Placed call to patient with no answer. Left message requesting a call back.  Provided my contact information.  PLAN: will mail unsuccessful outreach letter and attempt again in 3-4 days.  Tomasa Rand, RN, BSN, CEN [facility] [employer] [phone]

## 2020-06-01 ENCOUNTER — Other Ambulatory Visit: Payer: Self-pay

## 2020-06-01 NOTE — Patient Outreach (Signed)
[place] [facility]) Care Management  06/01/2020  [name] [name] 04/06/1941 [id number]   Telephone assessment:  Reopened case as patient informed social worker or asthma questions. I have called patient x2 with no answer. I left a message requesting a call back.  PLAN: will await a call back from patient if no response will attempt again in 3 days.   Tomasa Rand, RN, BSN, CEN [facility] [employer] [phone]

## 2020-06-02 ENCOUNTER — Other Ambulatory Visit: Payer: Self-pay | Admitting: *Deleted

## 2020-06-02 ENCOUNTER — Other Ambulatory Visit: Payer: Self-pay

## 2020-06-02 NOTE — Patient Outreach (Signed)
[place] [facility]) Care Management  06/02/2020  [name] [name] 01-20-1941 [id number]    Incoming call from patient. Patient left me a voicemail requesting a call back. I returned call with no answer.  Left message for patient to call me back.  PLAN: will continue outreach attempts.  Tomasa Rand, RN, BSN, CEN [facility] [employer] [phone]

## 2020-06-06 ENCOUNTER — Other Ambulatory Visit: Payer: Self-pay

## 2020-06-06 NOTE — Patient Outreach (Signed)
[place] [facility]) Care Management  06/06/2020  KARENA KINKER [birth year] [id number]   Telephone assessment:  Voicemail from patient requesting a call back. Placed call back to patient with no answer. Left a message.   PLAN:  Will attempt again in 3 days. Tomasa Rand, RN, BSN, CEN [facility] [employer] [phone]

## 2020-06-07 ENCOUNTER — Ambulatory Visit: Payer: Self-pay | Admitting: *Deleted

## 2020-06-08 ENCOUNTER — Other Ambulatory Visit: Payer: Self-pay | Admitting: *Deleted

## 2020-06-08 NOTE — Patient Outreach (Addendum)
[place] [facility]) Care Management  06/08/2020  [name] [name] 1941-03-21 [id number]   CSW attempted phone outreach to pt and was unsuccessful.  CSW left a HIPPA compliant voice message and will attempt again in 3-4 business days per policy if no return call is received.  CSW will mail pt transportation resources to consider; including Medicaid transportation, [employer], [place] and the [employer].    Eduard Clos, MSW, [place] Worker  [place] [phone]

## 2020-06-09 ENCOUNTER — Other Ambulatory Visit: Payer: Self-pay

## 2020-06-09 NOTE — Patient Outreach (Signed)
[place] [facility]) Care Management  06/09/2020  [name] [name] 09/24/1941 [id number]    Telephone assessment:  Patient has returned my call and left messages for me to return her call. I called patient today without an answer. I left a message requesting a call back.  PLAN: No answer. Left a message requesting a return call.  Tomasa Rand, RN, BSN, CEN [facility] [employer] [phone]

## 2020-06-12 ENCOUNTER — Ambulatory Visit: Payer: Self-pay | Admitting: *Deleted

## 2020-06-13 ENCOUNTER — Other Ambulatory Visit (HOSPITAL_COMMUNITY)
Admission: RE | Admit: 2020-06-13 | Discharge: 2020-06-13 | Disposition: A | Discharge status: Home / Self Care | Payer: Medicare Other | Source: Ambulatory Visit | Attending: Radiology | Admitting: Radiology

## 2020-06-13 ENCOUNTER — Ambulatory Visit
Admission: RE | Admit: 2020-06-13 | Discharge: 2020-06-13 | Disposition: A | Discharge status: Home / Self Care | Payer: Medicare Other | Source: Ambulatory Visit | Attending: Internal Medicine | Admitting: Internal Medicine

## 2020-06-13 DIAGNOSIS — E041 Nontoxic single thyroid nodule: Secondary | ICD-10-CM

## 2020-06-14 ENCOUNTER — Other Ambulatory Visit: Payer: Self-pay

## 2020-06-14 LAB — CYTOLOGY - NON PAP

## 2020-06-16 ENCOUNTER — Ambulatory Visit: Payer: Self-pay | Admitting: *Deleted

## 2020-06-16 ENCOUNTER — Other Ambulatory Visit: Payer: Self-pay | Admitting: *Deleted

## 2020-06-16 NOTE — Patient Outreach (Signed)
[place] [facility]) Care Management  06/16/2020  [name] [name] [birth year] [id number]   CSW attempted to reach pt and was unable to reach.  CSW will plan to outreach pt again in the next 30 days if no return call is received.   Eduard Clos, MSW, [place] Worker  [place] [phone]

## 2020-06-17 NOTE — Patient Outreach (Signed)
[place] [facility]) Care Management  06/17/2020  LEIAH GIANNOTTI [birth year] [id number]   Late entry: Telephone outreach on 06/14/2020    Placed call to patient with no answer. Left a message requesting a call back.  PLAN: will attempt again in 3 days. If no answer will plan case closure.  Tomasa Rand, RN, BSN, CEN [facility] [employer] [phone]

## 2020-06-19 ENCOUNTER — Other Ambulatory Visit: Payer: Self-pay

## 2020-06-19 NOTE — Patient Outreach (Signed)
[place] [facility]) Care Management  06/19/2020  [name] [name] Oct 08, 1941 [id number]    Telephone assessment:  Placed call to patient again with no answer Many calls back and forth and unable to contact patient.  PLAN: will close case. IN basket message sent to [facility] social worker.    Tomasa Rand, RN, BSN, CEN [facility] [employer] [phone]

## 2020-06-20 ENCOUNTER — Other Ambulatory Visit: Payer: Self-pay

## 2020-06-20 NOTE — Patient Outreach (Signed)
[place] [facility]) Care Management  06/20/2020  CASEY MAXFIELD 03-15-1941 [id number]   Incoming call from patient.  Reviewed with patient the reason I have been attempting to reach her.  She reports to me that she has all the information she needs about asthma.  I inquired if she had any other additional needs and she has declined.   PLAN: case will remain closed due to no additional needs identified.  Tomasa Rand, RN, BSN, CEN [facility] [employer] [phone]

## 2020-06-25 NOTE — Progress Notes (Signed)
IMPRESSION: 1. No evidence of interstitial lung disease. 2. 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 3. Bilateral thyroid nodules. Recommend thyroid US (ref: J Am Coll Radiol. 2015 Feb;12(2): 143-50). 4. Aortic atherosclerosis (ICD10-I70.0). Coronary artery calcification. 5.  Emphysema (ICD10-J43.9).   Electronically Signed   By: Lorin Picket M.D.   On: 04/26/2020 14:38

## 2020-06-27 ENCOUNTER — Telehealth: Payer: Self-pay | Admitting: Internal Medicine

## 2020-06-27 NOTE — Telephone Encounter (Signed)
   Patient calling for imaging results from 8/3

## 2020-06-28 NOTE — Telephone Encounter (Signed)
ATC patient unable to reach left message to call back   From [name] result note  IMPRESSION: 1. No evidence of interstitial lung disease. 2. 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 3. Bilateral thyroid nodules. Recommend thyroid US (ref: J Am Coll Radiol. 2015 Feb;12(2): 143-50). 4. Aortic atherosclerosis (ICD10-I70.0). Coronary artery calcification. 5. Emphysema (ICD10-J43.9).

## 2020-06-29 ENCOUNTER — Other Ambulatory Visit: Payer: Self-pay | Admitting: *Deleted

## 2020-06-29 ENCOUNTER — Ambulatory Visit: Payer: Self-pay | Admitting: *Deleted

## 2020-06-29 NOTE — Telephone Encounter (Signed)
lmtcb for pt.  

## 2020-06-29 NOTE — Patient Outreach (Signed)
[place] [facility]) Care Management  06/29/2020  DESHANTA LADY 1941/05/28 [id number]   CSW attempted to reach pt by phone today and was unable to reach.  CSW was able to leave a HIPPA compliant voice message and will attempt outreach again in 30 days.   Eduard Clos, MSW, [place] Worker  [place] [phone]

## 2020-06-29 NOTE — Telephone Encounter (Signed)
Called pt, LVM.   

## 2020-07-10 NOTE — Telephone Encounter (Signed)
ATC patient unable to reach LM to call back office (x2) 

## 2020-07-11 NOTE — Telephone Encounter (Signed)
lmtcb for pt. Per triage protocol this message will be closed as there have been several unsuccessful attempts to reach pt.

## 2020-07-28 ENCOUNTER — Other Ambulatory Visit: Payer: Self-pay | Admitting: *Deleted

## 2020-07-28 ENCOUNTER — Ambulatory Visit: Payer: Self-pay | Admitting: *Deleted

## 2020-07-28 NOTE — Patient Outreach (Signed)
[place] [facility]) Care Management  07/28/2020  KARLIAH KOWALCHUK [birth year] [id number]   CSW attempted a final outreach to pt and was unsuccessful.  CSW will close this referral and advise [facility] team and PCP.  Eduard Clos, MSW, [place] Worker  [place] [phone]

## 2020-07-31 ENCOUNTER — Encounter: Payer: Self-pay | Admitting: Cardiovascular Disease

## 2020-08-09 ENCOUNTER — Other Ambulatory Visit: Payer: Self-pay | Admitting: Internal Medicine
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
## 2020-08-14 ENCOUNTER — Encounter: Payer: Self-pay | Admitting: Internal Medicine

## 2020-08-14 ENCOUNTER — Ambulatory Visit (INDEPENDENT_AMBULATORY_CARE_PROVIDER_SITE_OTHER): Payer: Medicare Other

## 2020-08-14 ENCOUNTER — Other Ambulatory Visit: Payer: Self-pay

## 2020-08-14 ENCOUNTER — Ambulatory Visit (INDEPENDENT_AMBULATORY_CARE_PROVIDER_SITE_OTHER): Payer: Medicare Other | Admitting: Internal Medicine

## 2020-08-14 VITALS — BP 162/96 | HR 71 | Temp 98.6°F | Ht 63.5 in | Wt 170.0 lb

## 2020-08-14 DIAGNOSIS — M19012 Primary osteoarthritis, left shoulder: Secondary | ICD-10-CM | POA: Diagnosis not present

## 2020-08-14 DIAGNOSIS — M25512 Pain in left shoulder: Secondary | ICD-10-CM | POA: Insufficient documentation

## 2020-08-14 MED ORDER — TRAMADOL HCL 50 MG PO TABS: 50.0000 mg | ORAL_TABLET | Freq: Three times a day (TID) | ORAL | 0 refills | Status: AC | PRN

## 2020-08-14 NOTE — Assessment & Plan Note (Signed)
Acute Xray today Will refer to ortho Tylenol 1000 mg at night - it make her drowsy Tramadol for severe pain  - 50 mg TID prn - take with tylenol at night  [place] controlled substance database checked.

## 2020-08-14 NOTE — Patient Instructions (Addendum)
Have an xray today downstairs of your shoulder.     A referral was ordered for orthopedics - they will call you to schedule an appointment.    Try tramadol for your pain three times a day if needed.  Take with tylenol 1000 mg at night.

## 2020-08-21 ENCOUNTER — Other Ambulatory Visit: Payer: Self-pay

## 2020-08-21 ENCOUNTER — Ambulatory Visit (INDEPENDENT_AMBULATORY_CARE_PROVIDER_SITE_OTHER): Payer: Medicare Other | Admitting: Family Medicine

## 2020-08-21 ENCOUNTER — Encounter: Payer: Self-pay | Admitting: Family Medicine

## 2020-08-21 DIAGNOSIS — G8929 Other chronic pain: Secondary | ICD-10-CM | POA: Diagnosis not present

## 2020-08-21 DIAGNOSIS — M25512 Pain in left shoulder: Secondary | ICD-10-CM | POA: Diagnosis not present

## 2020-08-21 MED ORDER — METHYLPREDNISOLONE 4 MG PO TBPK: ORAL_TABLET | ORAL | 0 refills | Status: DC
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
## 2020-08-26 IMAGING — US US THYROID
1 series · 12 of 25 positions shown · non-contrast
Comparison: None.

CLINICAL DATA: 79-year-old female with thyroid nodule on prior CT

EXAM:
THYROID ULTRASOUND
TECHNIQUE: Ultrasound examination of the thyroid gland and adjacent soft
tissues was performed.

[Series 1: us thyroid · 0.07mm/px · 12 of 76 slices shown]
[im 4/76]
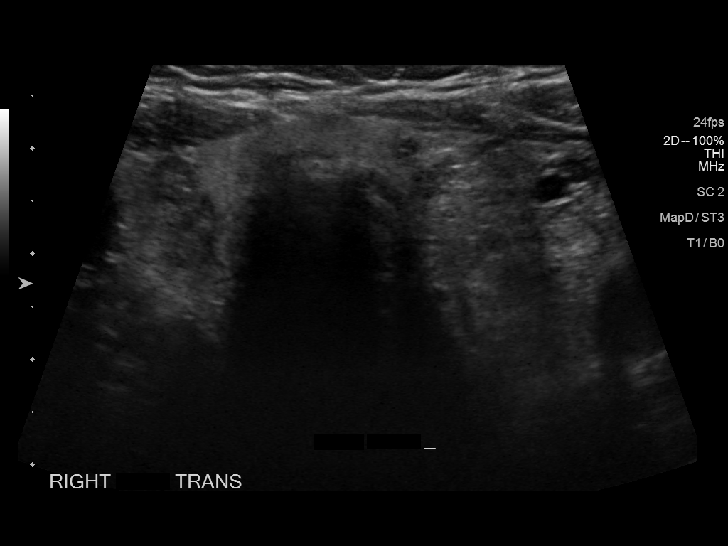
[im 10/76]
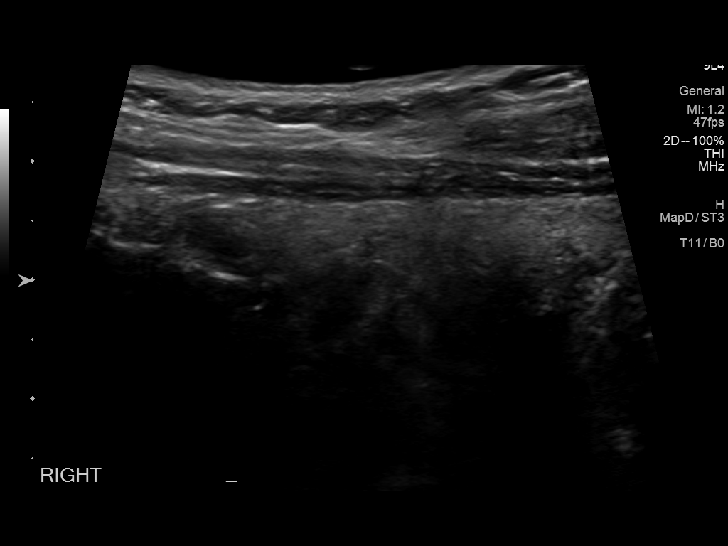
[im 16/76]
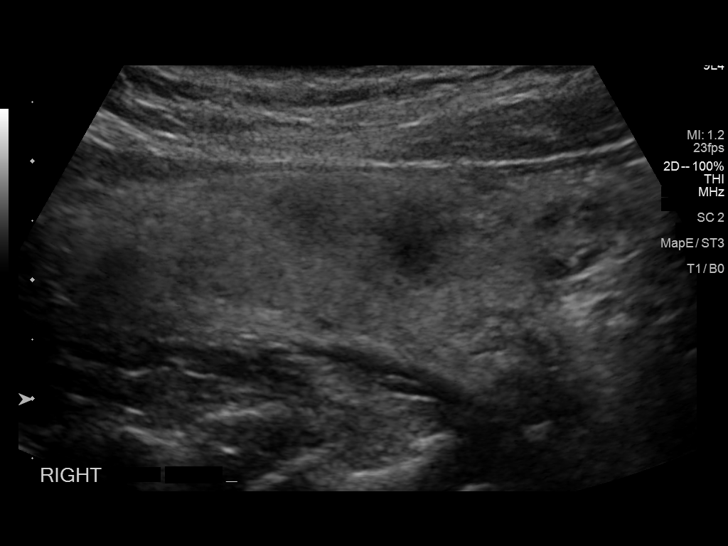
[im 22/76]
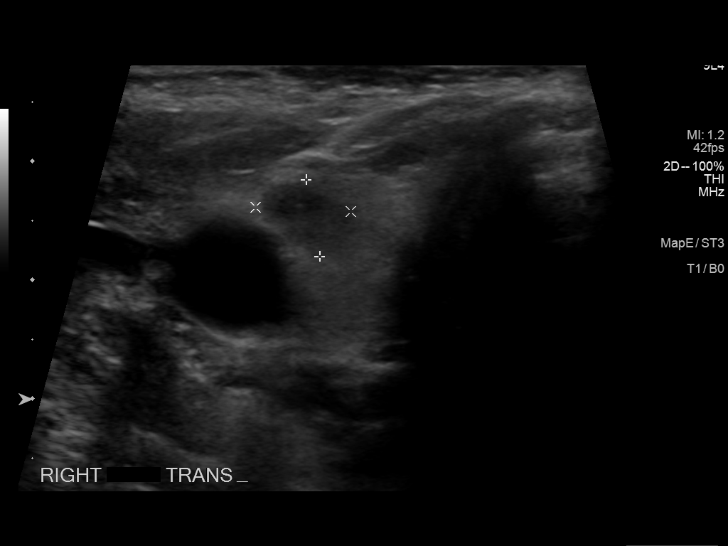
[im 29/76]
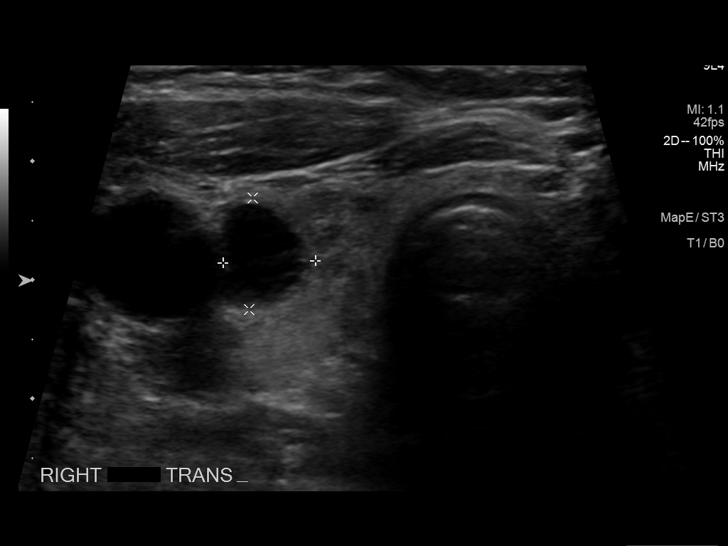
[im 35/76]
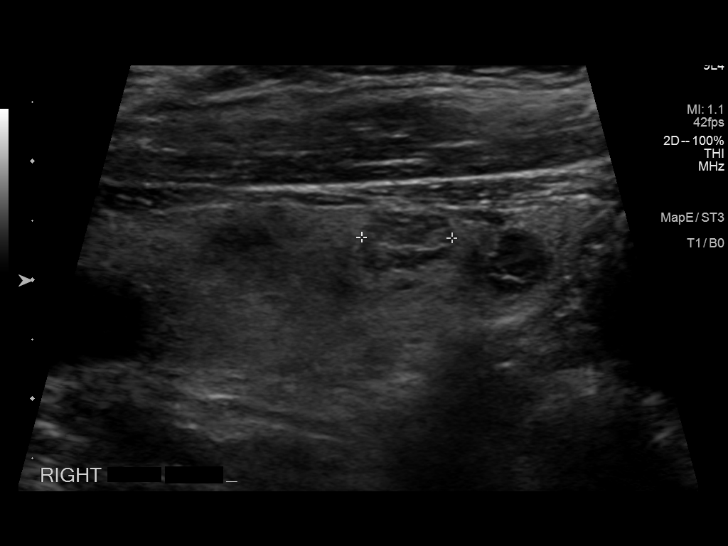
[im 41/76]
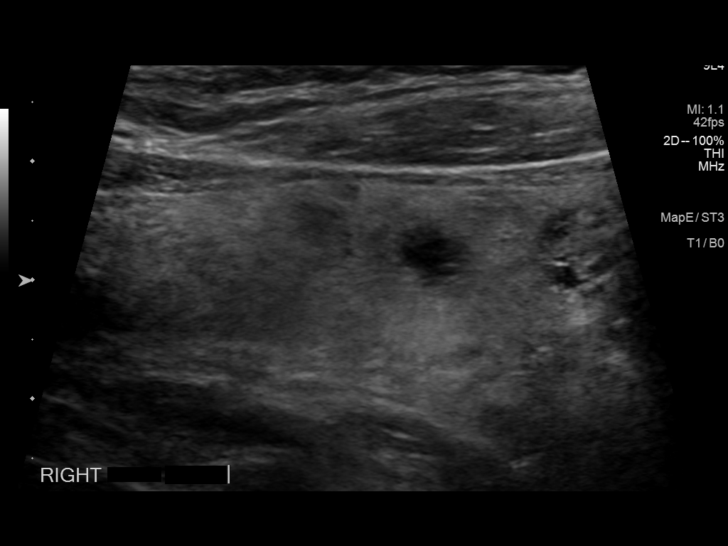
[im 47/76]
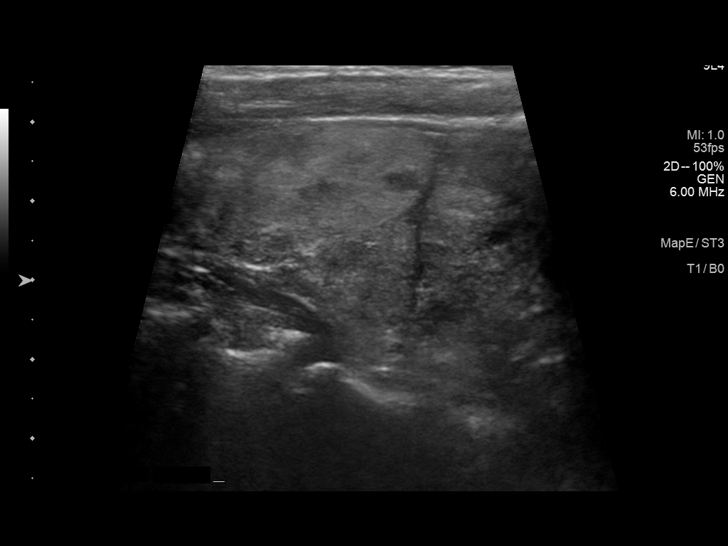
[im 54/76]
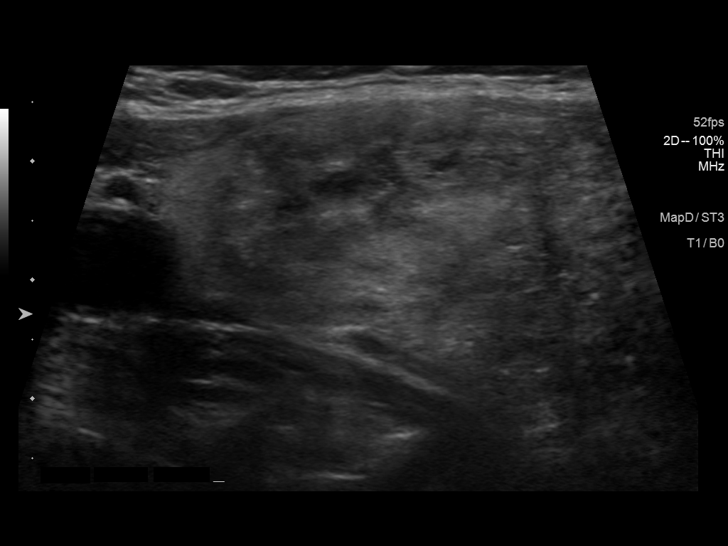
[im 60/76]
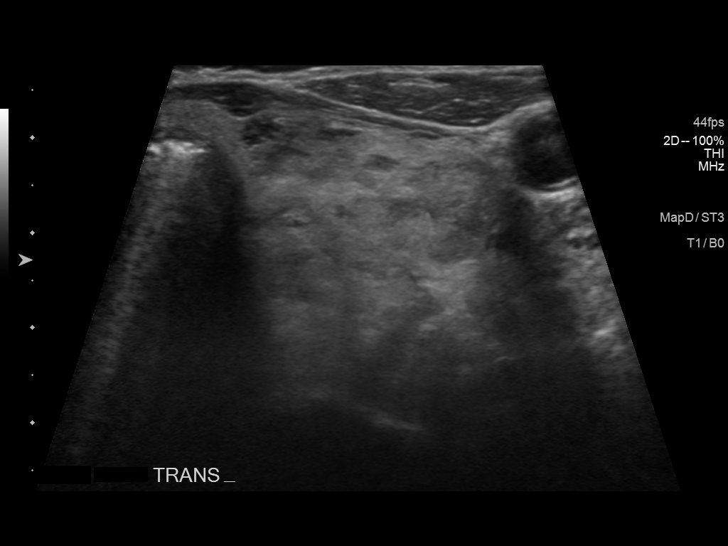
[im 66/76]
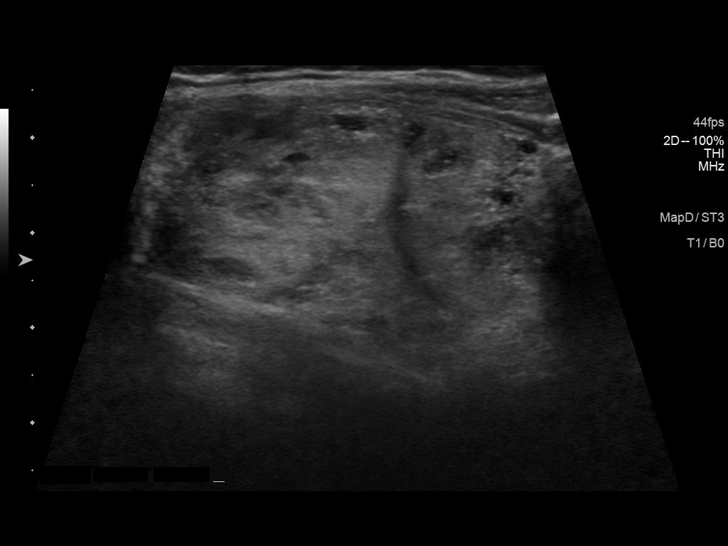
[im 72/76]
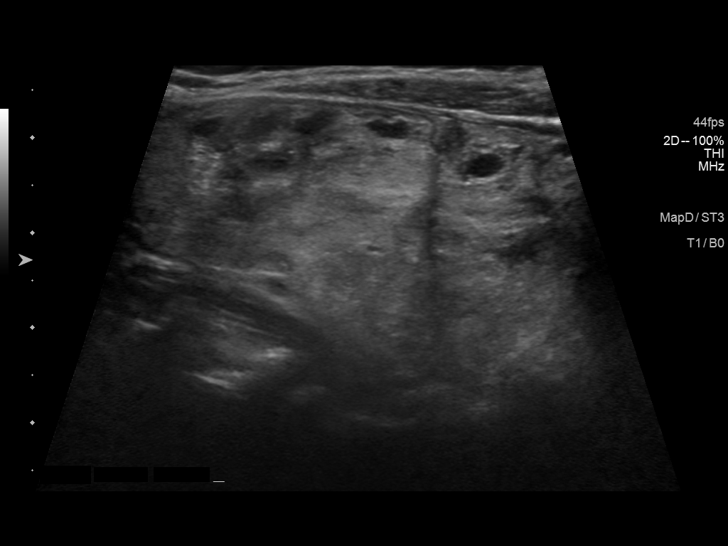

[12 of 25 positions shown; findings below may reference images not displayed]

FINDINGS: Parenchymal Echotexture: Moderately heterogenous

Isthmus: 0.3 cm

Right lobe: 5.3 cm x 1.6 cm x 1.6 cm

Left lobe: 5.2 cm x 2.9 cm x 2.8 cm

_________________________________________________________

Estimated total number of nodules >/= 1 cm: 4

Number of spongiform nodules >/=  2 cm not described below (TR1): 0

Number of mixed cystic and solid nodules >/= 1.5 cm not described
below (TR2): 0

_________________________________________________________

Nodule # 1:

Location: Right; Superior

Maximum size: 0.8 cm; Other 2 dimensions: 0.7 cm x 0.6 cm

Composition: cannot determine (2)

Echogenicity: hypoechoic (2)

Shape: not taller-than-wide (0)

Margins: ill-defined (0)

Echogenic foci: none (0)

ACR TI-RADS total points: 4.

ACR TI-RADS risk category: TR4 (4-6 points).

ACR TI-RADS recommendations:

Nodule does not meet criteria for surveillance or biopsy

_________________________________________________________

Nodule # 2:

Location: Right; Mid

Maximum size: 1.3 cm; Other 2 dimensions: 0.9 cm x 0.8 cm

Composition: cystic/almost completely cystic (0)

Echogenicity: anechoic (0)

Shape: not taller-than-wide (0)

Margins: smooth (0)

Echogenic foci: none (0)

ACR TI-RADS total points: 0.

ACR TI-RADS risk category: TR1 (0-1 points).

ACR TI-RADS recommendations:

Cystic nodule does not meet criteria for surveillance or biopsy

_________________________________________________________

Nodule # 3:

Location: Right; Mid

Maximum size: 0.8 cm; Other 2 dimensions: 0.7 cm x 0.6 cm

Composition: spongiform (0)

ACR TI-RADS recommendations:

Spongiform nodule does not meet criteria for surveillance or biopsy

_________________________________________________________

Nodule # 4:

Location: Right; Inferior

Maximum size: 1.1 cm; Other 2 dimensions: 0.9 cm x 0.6 cm

Composition: spongiform (0)

ACR TI-RADS recommendations:

Spongiform nodule does not meet criteria for surveillance or biopsy

Nodule # 5:

Location: Left; Mid

Maximum size: 2.8 cm; Other 2 dimensions: 2.6 cm x 1.7 cm

Composition: solid/almost completely solid (2)

Echogenicity: isoechoic (1)

Shape: not taller-than-wide (0)

Margins: ill-defined (0)

Echogenic foci: none (0)

ACR TI-RADS total points: 3.

ACR TI-RADS risk category: TR3 (3 points).

ACR TI-RADS recommendations:

Nodule meets criteria for biopsy

_________________________________________________________

Nodule # 6:

Location: Left; Inferior

Maximum size: 1.7 cm; Other 2 dimensions: 1.5 cm x 1.3 cm

Composition: solid/almost completely solid (2)

Echogenicity: isoechoic (1)

Shape: not taller-than-wide (0)

Margins: ill-defined (0)

Echogenic foci: none (0)

ACR TI-RADS total points: 3.

ACR TI-RADS risk category: TR3 (3 points).

ACR TI-RADS recommendations:

Nodule meets criteria for surveillance

_________________________________________________________

No adenopathy
IMPRESSION: Heterogeneous multinodular thyroid.

Left thyroid nodule (labeled 5, 2.8 cm, TR 3) meets criteria for
biopsy, as designated by the newly established ACR TI-RADS criteria,
and referral for biopsy is recommended.

Left lower thyroid nodule (labeled 6, 1.7 cm, TR 3) meets criteria
for surveillance, as designated by the newly established ACR TI-RADS
criteria. Surveillance ultrasound study recommended to be performed
annually up to 5 years.

Recommendations follow those established by the new ACR TI-RADS
criteria ([HOSPITAL] 1749;[DATE]).

## 2020-08-30 ENCOUNTER — Telehealth: Payer: Self-pay | Admitting: Family Medicine

## 2020-08-30 ENCOUNTER — Other Ambulatory Visit: Payer: Self-pay | Admitting: Family Medicine

## 2020-08-30 ENCOUNTER — Encounter: Payer: Self-pay | Admitting: Internal Medicine

## 2020-08-30 DIAGNOSIS — M25512 Pain in left shoulder: Secondary | ICD-10-CM

## 2020-08-30 DIAGNOSIS — M7989 Other specified soft tissue disorders: Secondary | ICD-10-CM

## 2020-08-30 DIAGNOSIS — G8929 Other chronic pain: Secondary | ICD-10-CM

## 2020-08-30 MED ORDER — TRAMADOL HCL 50 MG PO TABS: 50.0000 mg | ORAL_TABLET | Freq: Four times a day (QID) | ORAL | 0 refills | Status: DC | PRN

## 2020-08-30 NOTE — Telephone Encounter (Signed)
Patient called requesting a referral be sent for patient to have ultrasound as discussed with Dr. Junius Roads. Patient states she still is in a lot of pain and would like ultrasound. Please call patient at [phone] about this matter.

## 2020-08-30 NOTE — Telephone Encounter (Signed)
The patient finished the medrol dosepack. She was better for a few days, but then the pain and inflammation returned in the shoulder. Throbs at night. She is not interested in an injection. Instead, she would like to have an MRI of the shoulder and if possible, a refill on tramadol - Dr. Quay Burow had given this to her. The patient only takes a 1/2 at a time, mainly at night. Please advise.

## 2020-08-30 NOTE — Telephone Encounter (Signed)
I called the patient and advised her the MRI has been ordered. Tramadol was also sent in to her pharmacy.

## 2020-08-30 NOTE — Telephone Encounter (Signed)
MRI ordered

## 2020-09-01 ENCOUNTER — Other Ambulatory Visit: Payer: Self-pay

## 2020-09-01 ENCOUNTER — Ambulatory Visit (HOSPITAL_COMMUNITY)
Admission: RE | Admit: 2020-09-01 | Discharge: 2020-09-01 | Disposition: A | Discharge status: Home / Self Care | Payer: Medicare Other | Source: Ambulatory Visit | Attending: Cardiology | Admitting: Cardiology

## 2020-09-01 DIAGNOSIS — M79602 Pain in left arm: Secondary | ICD-10-CM

## 2020-09-01 DIAGNOSIS — M7989 Other specified soft tissue disorders: Secondary | ICD-10-CM | POA: Insufficient documentation

## 2020-09-03 ENCOUNTER — Other Ambulatory Visit: Payer: Self-pay | Admitting: Internal Medicine

## 2020-09-20 ENCOUNTER — Telehealth: Payer: Self-pay

## 2020-09-20 MED ORDER — DIAZEPAM 5 MG PO TABS: ORAL_TABLET | ORAL | 0 refills | Status: DC

## 2020-09-20 NOTE — Telephone Encounter (Signed)
Patient called she is requesting rx to help with claustrophobia, she is having a MRI tomorrow. Call [phone]

## 2020-09-20 NOTE — Addendum Note (Signed)
Addended by: Hortencia Pilar on: 09/20/2020 01:01 PM   Modules accepted: Orders

## 2020-09-20 NOTE — Telephone Encounter (Signed)
I called and advised the patient. 

## 2020-09-20 NOTE — Telephone Encounter (Signed)
Valium Rx sent

## 2020-09-20 NOTE — Telephone Encounter (Signed)
Please advise 

## 2020-09-21 ENCOUNTER — Ambulatory Visit
Admission: RE | Admit: 2020-09-21 | Discharge: 2020-09-21 | Disposition: A | Discharge status: Home / Self Care | Payer: Medicare Other | Source: Ambulatory Visit | Attending: Family Medicine | Admitting: Family Medicine

## 2020-09-21 DIAGNOSIS — M19012 Primary osteoarthritis, left shoulder: Secondary | ICD-10-CM | POA: Diagnosis not present

## 2020-09-21 DIAGNOSIS — G8929 Other chronic pain: Secondary | ICD-10-CM

## 2020-09-21 DIAGNOSIS — M25512 Pain in left shoulder: Secondary | ICD-10-CM

## 2020-09-21 DIAGNOSIS — M25412 Effusion, left shoulder: Secondary | ICD-10-CM | POA: Diagnosis not present

## 2020-09-22 ENCOUNTER — Telehealth: Payer: Self-pay | Admitting: Family Medicine

## 2020-09-22 NOTE — Telephone Encounter (Signed)
MRI shows tendinopathy (irritation/inflammation) of the rotator cuff, but no tears.  There is some arthritis in the shoulder as well.  No indication for surgery at this point.  Treatment options include:  - Referral to physical therapy  - Cortisone injection

## 2020-10-02 ENCOUNTER — Ambulatory Visit (INDEPENDENT_AMBULATORY_CARE_PROVIDER_SITE_OTHER): Payer: Medicare Other | Admitting: Internal Medicine

## 2020-10-02 ENCOUNTER — Other Ambulatory Visit: Payer: Self-pay

## 2020-10-02 ENCOUNTER — Encounter: Payer: Self-pay | Admitting: Internal Medicine

## 2020-10-02 VITALS — BP 128/84 | HR 63 | Ht 63.5 in | Wt 168.0 lb

## 2020-10-02 DIAGNOSIS — J4550 Severe persistent asthma, uncomplicated: Secondary | ICD-10-CM

## 2020-10-02 DIAGNOSIS — D721 Eosinophilia, unspecified: Secondary | ICD-10-CM

## 2020-10-02 DIAGNOSIS — Z7185 Encounter for immunization safety counseling: Secondary | ICD-10-CM

## 2020-10-02 MED ORDER — BUDESONIDE-FORMOTEROL FUMARATE 160-4.5 MCG/ACT IN AERO: 2.0000 | INHALATION_SPRAY | Freq: Two times a day (BID) | RESPIRATORY_TRACT | 3 refills | Status: DC
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
## 2020-10-02 NOTE — Patient Instructions (Addendum)
Severe persistent asthma without complication Eosinophilia  - currently stable but still with baseline high symptom burden - Yo uhave very high IgE and eosinophils putting you at high risk asthma category - ASthma biologic recommended but respect your deferral  - Noted you do not want to do spiriva  Plan - continue symbicort daily and singulair daily -Hold off initiating any biologic therapy based on your wishes   Vaccination counseling  -Respect refusal to take flu shot and Covid shot because of allergy history -Continue masking and please note there are antibodies available if you get disease of Covid.  Follow-up -6 months or sooner with [name]  - ACT test at followup

## 2020-10-16 ENCOUNTER — Telehealth: Payer: Self-pay | Admitting: Internal Medicine

## 2020-10-16 NOTE — Telephone Encounter (Signed)
Called pt daughter to schedule AWV with [facility]. She stated she will talk to her mother and call back to schedule.

## 2020-11-08 ENCOUNTER — Ambulatory Visit (INDEPENDENT_AMBULATORY_CARE_PROVIDER_SITE_OTHER): Payer: Medicare Other

## 2020-11-08 ENCOUNTER — Ambulatory Visit: Payer: Medicare Other

## 2020-11-08 VITALS — BP 130/60 | HR 71 | Temp 97.0°F | Ht 64.0 in | Wt 168.0 lb

## 2020-11-08 DIAGNOSIS — Z0000 Encounter for general adult medical examination without abnormal findings: Secondary | ICD-10-CM | POA: Diagnosis not present

## 2020-11-08 NOTE — Patient Instructions (Signed)
[name] [name] , Thank you for taking time to come for your Medicare Wellness Visit. I appreciate your ongoing commitment to your health goals. Please review the following plan we discussed and let me know if I can assist you in the future.   Screening recommendations/referrals: Colonoscopy: patient refused Mammogram: patient refused; does self-breast exam Bone Density: 10/04/2015; due every 2 years Recommended yearly ophthalmology/optometry visit for glaucoma screening and checkup Recommended yearly dental visit for hygiene and checkup  Vaccinations: Influenza vaccine: declined Pneumococcal vaccine: up to date Tdap vaccine: declined Shingles vaccine: declined   Covid-19: declined  Advanced directives: Advance directive discussed with you today. I have provided a copy for you to complete at home and have notarized. Once this is complete please bring a copy in to our office so we can scan it into your chart.  Conditions/risks identified: Yes; Reviewed health maintenance screenings with patient today and relevant education, vaccines, and/or referrals were provided. Please continue to do your personal lifestyle choices by: daily care of teeth and gums, regular physical activity (goal should be 5 days a week for 30 minutes), eat a healthy diet, avoid tobacco and drug use, limiting any alcohol intake, taking a low-dose aspirin (if not allergic or have been advised by your provider otherwise) and taking vitamins and minerals as recommended by your provider. Continue doing brain stimulating activities (puzzles, reading, adult coloring books, staying active) to keep memory sharp. Continue to eat heart healthy diet (full of fruits, vegetables, whole grains, lean protein, water--limit salt, fat, and sugar intake) and increase physical activity as tolerated.  Next appointment: Please schedule your next Medicare Wellness Visit with your Nurse Health Advisor in 1 year by calling [phone].   Preventive Care  22 Years and Older, Female Preventive care refers to lifestyle choices and visits with your health care provider that can promote health and wellness. What does preventive care include?  A yearly physical exam. This is also called an annual well check.  Dental exams once or twice a year.  Routine eye exams. Ask your health care provider how often you should have your eyes checked.  Personal lifestyle choices, including:  Daily care of your teeth and gums.  Regular physical activity.  Eating a healthy diet.  Avoiding tobacco and drug use.  Limiting alcohol use.  Practicing safe sex.  Taking low-dose aspirin every day.  Taking vitamin and mineral supplements as recommended by your health care provider. What happens during an annual well check? The services and screenings done by your health care provider during your annual well check will depend on your age, overall health, lifestyle risk factors, and family history of disease. Counseling  Your health care provider may ask you questions about your:  Alcohol use.  Tobacco use.  Drug use.  Emotional well-being.  Home and relationship well-being.  Sexual activity.  Eating habits.  History of falls.  Memory and ability to understand (cognition).  Work and work Astronomer.  Reproductive health. Screening  You may have the following tests or measurements:  Height, weight, and BMI.  Blood pressure.  Lipid and cholesterol levels. These may be checked every 5 years, or more frequently if you are over 72 years old.  Skin check.  Lung cancer screening. You may have this screening every year starting at age 70 if you have a 30-pack-year history of smoking and currently smoke or have quit within the past 15 years.  Fecal occult blood test (FOBT) of the stool. You may have this test  every year starting at age 4.  Flexible sigmoidoscopy or colonoscopy. You may have a sigmoidoscopy every 5 years or a colonoscopy  every 10 years starting at age 52.  Hepatitis C blood test.  Hepatitis B blood test.  Sexually transmitted disease (STD) testing.  Diabetes screening. This is done by checking your blood sugar (glucose) after you have not eaten for a while (fasting). You may have this done every 1-3 years.  Bone density scan. This is done to screen for osteoporosis. You may have this done starting at age 47.  Mammogram. This may be done every 1-2 years. Talk to your health care provider about how often you should have regular mammograms. Talk with your health care provider about your test results, treatment options, and if necessary, the need for more tests. Vaccines  Your health care provider may recommend certain vaccines, such as:  Influenza vaccine. This is recommended every year.  Tetanus, diphtheria, and acellular pertussis (Tdap, Td) vaccine. You may need a Td booster every 10 years.  Zoster vaccine. You may need this after age 39.  Pneumococcal 13-valent conjugate (PCV13) vaccine. One dose is recommended after age 20.  Pneumococcal polysaccharide (PPSV23) vaccine. One dose is recommended after age 22. Talk to your health care provider about which screenings and vaccines you need and how often you need them. This information is not intended to replace advice given to you by your health care provider. Make sure you discuss any questions you have with your health care provider. Document Released: 11/24/2015 Document Revised: 07/17/2016 Document Reviewed: 08/29/2015 Elsevier Interactive Patient Education  2017 [place] Prevention in the Home Falls can cause injuries. They can happen to people of all ages. There are many things you can do to make your home safe and to help prevent falls. What can I do on the outside of my home?  Regularly fix the edges of walkways and driveways and fix any cracks.  Remove anything that might make you trip as you walk through a door, such as a  raised step or threshold.  Trim any bushes or trees on the path to your home.  Use bright outdoor lighting.  Clear any walking paths of anything that might make someone trip, such as rocks or tools.  Regularly check to see if handrails are loose or broken. Make sure that both sides of any steps have handrails.  Any raised decks and porches should have guardrails on the edges.  Have any leaves, snow, or ice cleared regularly.  Use sand or salt on walking paths during winter.  Clean up any spills in your garage right away. This includes oil or grease spills. What can I do in the bathroom?  Use night lights.  Install grab bars by the toilet and in the tub and shower. Do not use towel bars as grab bars.  Use non-skid mats or decals in the tub or shower.  If you need to sit down in the shower, use a plastic, non-slip stool.  Keep the floor dry. Clean up any water that spills on the floor as soon as it happens.  Remove soap buildup in the tub or shower regularly.  Attach bath mats securely with double-sided non-slip rug tape.  Do not have throw rugs and other things on the floor that can make you trip. What can I do in the bedroom?  Use night lights.  Make sure that you have a light by your bed that is easy to reach.  Do not use any sheets or blankets that are too big for your bed. They should not hang down onto the floor.  Have a firm chair that has side arms. You can use this for support while you get dressed.  Do not have throw rugs and other things on the floor that can make you trip. What can I do in the kitchen?  Clean up any spills right away.  Avoid walking on wet floors.  Keep items that you use a lot in easy-to-reach places.  If you need to reach something above you, use a strong step stool that has a grab bar.  Keep electrical cords out of the way.  Do not use floor polish or wax that makes floors slippery. If you must use wax, use non-skid floor  wax.  Do not have throw rugs and other things on the floor that can make you trip. What can I do with my stairs?  Do not leave any items on the stairs.  Make sure that there are handrails on both sides of the stairs and use them. Fix handrails that are broken or loose. Make sure that handrails are as long as the stairways.  Check any carpeting to make sure that it is firmly attached to the stairs. Fix any carpet that is loose or worn.  Avoid having throw rugs at the top or bottom of the stairs. If you do have throw rugs, attach them to the floor with carpet tape.  Make sure that you have a light switch at the top of the stairs and the bottom of the stairs. If you do not have them, ask someone to add them for you. What else can I do to help prevent falls?  Wear shoes that:  Do not have high heels.  Have rubber bottoms.  Are comfortable and fit you well.  Are closed at the toe. Do not wear sandals.  If you use a stepladder:  Make sure that it is fully opened. Do not climb a closed stepladder.  Make sure that both sides of the stepladder are locked into place.  Ask someone to hold it for you, if possible.  Clearly mark and make sure that you can see:  Any grab bars or handrails.  First and last steps.  Where the edge of each step is.  Use tools that help you move around (mobility aids) if they are needed. These include:  Canes.  Walkers.  Scooters.  Crutches.  Turn on the lights when you go into a dark area. Replace any light bulbs as soon as they burn out.  Set up your furniture so you have a clear path. Avoid moving your furniture around.  If any of your floors are uneven, fix them.  If there are any pets around you, be aware of where they are.  Review your medicines with your doctor. Some medicines can make you feel dizzy. This can increase your chance of falling. Ask your doctor what other things that you can do to help prevent falls. This information is  not intended to replace advice given to you by your health care provider. Make sure you discuss any questions you have with your health care provider. Document Released: 08/24/2009 Document Revised: 04/04/2016 Document Reviewed: 12/02/2014 Elsevier Interactive Patient Education  2017 [employer].
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
## 2020-11-20 ENCOUNTER — Other Ambulatory Visit: Payer: Self-pay | Admitting: Internal Medicine

## 2020-12-05 ENCOUNTER — Other Ambulatory Visit: Payer: Self-pay | Admitting: Internal Medicine

## 2021-01-08 ENCOUNTER — Other Ambulatory Visit: Payer: Self-pay | Admitting: Internal Medicine

## 2021-02-06 ENCOUNTER — Other Ambulatory Visit: Payer: Self-pay | Admitting: Internal Medicine

## 2021-02-06 ENCOUNTER — Telehealth: Payer: Self-pay | Admitting: Internal Medicine

## 2021-02-06 MED ORDER — BUDESONIDE-FORMOTEROL FUMARATE 160-4.5 MCG/ACT IN AERO: 2.0000 | INHALATION_SPRAY | Freq: Two times a day (BID) | RESPIRATORY_TRACT | 3 refills | Status: DC

## 2021-02-06 NOTE — Telephone Encounter (Signed)
Called and spoke with patient. She stated that she requested a refill on her Symbicort today but they gave her the generic. She does not tolerate the generic well. Advised her that I would send a refill to the pharmacy to specify for her to receive brand name only. She verbalized understanding.   Nothing further needed at time of call.

## 2021-02-09 ENCOUNTER — Telehealth: Payer: Self-pay | Admitting: Internal Medicine

## 2021-02-09 NOTE — Telephone Encounter (Signed)
Patient called about making her appointment for a med refill.  The patient is sick (cough, congestion, runny nose) so she asked for it to be on mychart. I let her know that med refills usually need an office appointment unless stated otherwise.  I set up a future appointment for 4.11.22, unless things need to change please advise the patient if so.   Medications: amLODipine (NORVASC) 5 MG tablet  montelukast (SINGULAIR) 10 MG tablet

## 2021-02-09 NOTE — Telephone Encounter (Signed)
Appointment has been changed to mychart due to the patient being sick.

## 2021-02-13 ENCOUNTER — Other Ambulatory Visit: Payer: Self-pay | Admitting: Internal Medicine

## 2021-02-19 ENCOUNTER — Telehealth: Payer: Medicare Other | Admitting: Internal Medicine

## 2021-02-19 NOTE — Progress Notes (Signed)
Patient unable to do video after multiple attempts and then unavailable via phone.

## 2021-03-02 ENCOUNTER — Telehealth: Payer: Self-pay | Admitting: Internal Medicine

## 2021-03-02 MED ORDER — PREDNISONE 10 MG PO TABS: ORAL_TABLET | ORAL | 0 refills | Status: DC

## 2021-03-02 NOTE — Telephone Encounter (Signed)
Patient states having symptoms of cough and mucus. Pharmacy is [facility] [address] Patient phone number Is [phone].

## 2021-03-02 NOTE — Telephone Encounter (Signed)
Patient is aware of results and voiced her understanding.  appt note has been updated to reflect phone visit for 04/05/2021 visit.  Rx for prednisone has been sent to preferred pharmacy. Patient stated that she has taken prednisone previously without reaction.  Nothing further needed.

## 2021-03-02 NOTE — Telephone Encounter (Signed)
Primary Pulmonologist: [name] Last office visit and with whom: [name] on 10/02/2020 What do we see them for (pulmonary problems): Severe persistent asthma, Eosinophilia Last OV assessment/plan: see below  Was appointment offered to patient (explain)?     Patient Instructions  Severe persistent asthma without complication Eosinophilia  - currently stable but still with baseline high symptom burden - Yo uhave very high IgE and eosinophils putting you at high risk asthma category - ASthma biologic recommended but respect your deferral  - Noted you do not want to do spiriva  Plan - continue symbicort daily and singulair daily -Hold off initiating any biologic therapy based on your wishes   Vaccination counseling  -Respect refusal to take flu shot and Covid shot because of allergy history -Continue masking and please note there are antibodies available if you get disease of Covid.  Follow-up -6 months or sooner with [name]             - ACT test at followup    Reason for call: Sinus congestion, coughing with yellow mucous and wheezing. Started few days ago. More winded with exertion. No fever or chills. Still using symbicort and singulair as directed. Patient also has not been covid tested or had vaccines. Patient is also requesting visit on 04/05/2021 be changed to a televisit due to transportation. Told patient I would let MR know and call back with any recs from him. Patient verbalized understanding.  Dr. Sheilah Pigeon: please respond with any recs for the patient and if you are agreeable to making visit a televisit. Thanks!  (examples of things to ask: : When did symptoms start? Fever? Cough? Productive? Color to sputum? More sputum than usual? Wheezing? Have you needed increased oxygen? Are you taking your respiratory medications? What over the counter measures have you tried?)  Allergies  Allergen Reactions  . Influenza Vaccines Swelling    Pt allergic  to eggs---Anaphylactic Shock  . Latex Anaphylaxis and Swelling  . Albuterol Anxiety    Switched to xopenex  . Advair Diskus [Fluticasone-Salmeterol]     Tingling in mouth/ears ringing  . Diclofenac Sodium     REACTION: Hives  . Diclofenac Sodium Swelling  . Doxycycline Nausea And Vomiting  . Dulera [Mometasone Furo-Formoterol Fum]     HA  . Other Swelling  . Penicillins Swelling    Tongue swelling  . Sulfonamide Derivatives     Tongue swelling   . Tiotropium Itching and Other (See Comments)    dysuria  . Tiotropium Bromide Monohydrate     Tongue/mouth itching Dysuria     Immunization History  Administered Date(s) Administered  . Pneumococcal Conjugate-13 09/20/2013  . Pneumococcal Polysaccharide-23 12/25/2010  . Td 01/09/2005

## 2021-03-02 NOTE — Telephone Encounter (Signed)
Okay for Apr 05, 2021 visit to be televisit  Seems she is currently having an exacerbation  - Take prednisone 40 mg daily x 2 days, then 20mg  daily x 2 days, then 10mg  daily x 2 days, then 5mg  daily x 2 days and stop  -If there is fever or yellow phlegm or if she had a sick contact: Then I recommend an antibiotic -but given multiple allergies probably best to avoid antibiotic at this point   Allergies  Allergen Reactions  . Influenza Vaccines Swelling    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  . Latex Anaphylaxis and Swelling  . Albuterol Anxiety    Switched to xopenex  . Advair Diskus [Fluticasone-Salmeterol]     Tingling in mouth/ears ringing  . Diclofenac Sodium     REACTION: Hives  . Diclofenac Sodium Swelling  . Doxycycline Nausea And Vomiting  . Dulera [Mometasone Furo-Formoterol Fum]     HA  . Other Swelling  . Penicillins Swelling    Tongue swelling  . Sulfonamide Derivatives     Tongue swelling   . Tiotropium Itching and Other (See Comments)    dysuria  . Tiotropium Bromide Monohydrate     Tongue/mouth itching Dysuria

## 2021-04-05 ENCOUNTER — Other Ambulatory Visit: Payer: Self-pay

## 2021-04-05 ENCOUNTER — Ambulatory Visit (INDEPENDENT_AMBULATORY_CARE_PROVIDER_SITE_OTHER): Payer: Medicare Other | Admitting: Internal Medicine

## 2021-04-05 DIAGNOSIS — J4550 Severe persistent asthma, uncomplicated: Secondary | ICD-10-CM | POA: Diagnosis not present

## 2021-04-05 DIAGNOSIS — D721 Eosinophilia, unspecified: Secondary | ICD-10-CM
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
## 2021-04-05 NOTE — Patient Instructions (Addendum)
Severe persistent asthma without complication Eosinophilia  - currently stable wihtout ER use or prednisone use or night awakening - subjectively well controlled - Yo have very high IgE and eosinophils putting you at high risk asthma category - ASthma biologic recommended but respect your deferral  - Noted you do not want to do spiriva - Noted that after stopping wheat breathing and allergies better - noted noted on daily prednisone   Plan - continue symbicort daily and singulair daily -Hold off initiating any biologic therapy based on your wishes - continue masking and social distancing   Follow-up -6 months or sooner with [name]  - ACT test at followup  - face to face

## 2021-04-06 ENCOUNTER — Telehealth: Payer: Self-pay | Admitting: Internal Medicine

## 2021-04-06 NOTE — Telephone Encounter (Signed)
Spoke to patient, who stated that she forgot to mention lab work during yesterday's visit.  She feels that it is time for her annual labs.  [name], please advise. Thanks

## 2021-04-10 NOTE — Telephone Encounter (Signed)
Called patient but she did not answer. Left message for her to call back.  

## 2021-04-10 NOTE — Telephone Encounter (Signed)
IS her PCP Hoyt Koch, MD not getting annual labs?

## 2021-04-11 NOTE — Telephone Encounter (Signed)
Spoke to patient, who states that she has not seen PCP recently.  She stated that MR typically gets annual labs as well.  MR, please advise. Thanks

## 2021-04-11 NOTE — Telephone Encounter (Signed)
ATC LVMTCB x 1  

## 2021-04-11 NOTE — Telephone Encounter (Signed)
Ok to get  Cbc with diff Blood ige bmet/lft quantiferon gold Tsh/ft4 hgba1c Vitamin D  You can choose asthma, general health, vit d def etc., to get the orders in

## 2021-04-12 NOTE — Telephone Encounter (Signed)
ATC, left VM for callback. 

## 2021-04-18 NOTE — Telephone Encounter (Signed)
Called and left a detailed message for pt to call back if she still needed the labs from our office. Pt has an appt with PCP on 04/26/2021, she may just get the labs from her PCP. Will close encounter at this time.

## 2021-04-25 ENCOUNTER — Telehealth: Payer: Self-pay | Admitting: Internal Medicine

## 2021-04-25 DIAGNOSIS — E559 Vitamin D deficiency, unspecified: Secondary | ICD-10-CM

## 2021-04-25 DIAGNOSIS — J4550 Severe persistent asthma, uncomplicated: Secondary | ICD-10-CM

## 2021-04-25 DIAGNOSIS — Z0000 Encounter for general adult medical examination without abnormal findings: Secondary | ICD-10-CM

## 2021-04-25 DIAGNOSIS — Z131 Encounter for screening for diabetes mellitus: Secondary | ICD-10-CM

## 2021-04-25 NOTE — Telephone Encounter (Signed)
Ok do cbc with diff, bmet, ige, vit d, tsh, hgba1c, ft4, lft, calcium, phos mag

## 2021-04-25 NOTE — Telephone Encounter (Signed)
Called and spoke with patient. She is aware MR is ok with her having the labs done here. I have placed the order. She will come by  the office next week.   Nothing further needed at time of call.

## 2021-04-25 NOTE — Telephone Encounter (Signed)
Called and spoke with pt and she stated that her PCP has cancelled the last 3 appts for the pt since these were televisits.  She stated that she is unable to go into the office due to the hot weather.  She is having a lot of issues and she stated that she can hardly breath in the house.  She wanted to see if MR wanted to have her do labs since she has not had any labs this year.  MR please advise. 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

## 2021-04-26 ENCOUNTER — Telehealth: Payer: Medicare Other | Admitting: Internal Medicine

## 2021-05-07 ENCOUNTER — Telehealth: Payer: Self-pay | Admitting: Internal Medicine

## 2021-05-07 ENCOUNTER — Other Ambulatory Visit (INDEPENDENT_AMBULATORY_CARE_PROVIDER_SITE_OTHER): Payer: Medicare Other

## 2021-05-07 DIAGNOSIS — J4550 Severe persistent asthma, uncomplicated: Secondary | ICD-10-CM

## 2021-05-07 DIAGNOSIS — E559 Vitamin D deficiency, unspecified: Secondary | ICD-10-CM

## 2021-05-07 DIAGNOSIS — Z0000 Encounter for general adult medical examination without abnormal findings: Secondary | ICD-10-CM

## 2021-05-07 LAB — BASIC METABOLIC PANEL
BUN: 20 mg/dL (ref 6–23)
CO2: 25 mEq/L (ref 19–32)
Calcium: 9.7 mg/dL (ref 8.4–10.5)
Chloride: 107 mEq/L (ref 96–112)
Creatinine, Ser: 1.23 mg/dL — ABNORMAL HIGH (ref 0.40–1.20)
GFR: 42.18 mL/min — ABNORMAL LOW (ref >60.00)
Glucose, Bld: 103 mg/dL — ABNORMAL HIGH (ref 70–99)
Potassium: 3.9 mEq/L (ref 3.5–5.1)
Sodium: 139 mEq/L (ref 135–145)

## 2021-05-07 LAB — VITAMIN D 25 HYDROXY (VIT D DEFICIENCY, FRACTURES): VITD: 21.21 ng/mL — ABNORMAL LOW (ref 30.00–100.00)

## 2021-05-07 LAB — HEPATIC FUNCTION PANEL
ALT: 11 U/L (ref 0–35)
AST: 20 U/L (ref 0–37)
Albumin: 3.7 g/dL (ref 3.5–5.2)
Alkaline Phosphatase: 66 U/L (ref 39–117)
Bilirubin, Direct: 0 mg/dL (ref 0.0–0.3)
Total Bilirubin: 0.2 mg/dL (ref 0.2–1.2)
Total Protein: 6.8 g/dL (ref 6.0–8.3)

## 2021-05-07 LAB — T4, FREE: Free T4: 0.92 ng/dL (ref 0.60–1.60)

## 2021-05-07 LAB — CBC WITH DIFFERENTIAL/PLATELET
Basophils Absolute: 0.1 10*3/uL (ref 0.0–0.1)
Basophils Relative: 1.2 % (ref 0.0–3.0)
Eosinophils Absolute: 0.9 10*3/uL — ABNORMAL HIGH (ref 0.0–0.7)
Eosinophils Relative: 16.9 % — ABNORMAL HIGH (ref 0.0–5.0)
HCT: 35.3 % — ABNORMAL LOW (ref 36.0–46.0)
Hemoglobin: 11.6 g/dL — ABNORMAL LOW (ref 12.0–15.0)
Lymphocytes Relative: 34.9 % (ref 12.0–46.0)
Lymphs Abs: 1.9 10*3/uL (ref 0.7–4.0)
MCHC: 32.9 g/dL (ref 30.0–36.0)
MCV: 86 fl (ref 78.0–100.0)
Monocytes Absolute: 0.5 10*3/uL (ref 0.1–1.0)
Monocytes Relative: 8.7 % (ref 3.0–12.0)
Neutro Abs: 2 10*3/uL (ref 1.4–7.7)
Neutrophils Relative %: 38.3 % — ABNORMAL LOW (ref 43.0–77.0)
Platelets: 195 10*3/uL (ref 150.0–400.0)
RBC: 4.11 Mil/uL (ref 3.87–5.11)
RDW: 13.6 % (ref 11.5–15.5)
WBC: 5.4 10*3/uL (ref 4.0–10.5)

## 2021-05-07 LAB — CALCIUM: Calcium: 9.7 mg/dL (ref 8.4–10.5)

## 2021-05-07 LAB — PHOSPHORUS: Phosphorus: 2.9 mg/dL (ref 2.3–4.6)

## 2021-05-07 LAB — TSH: TSH: 0.73 u[IU]/mL (ref 0.35–4.50)

## 2021-05-07 NOTE — Telephone Encounter (Signed)
Labs all fine except  - CKD + creat 1.24mg % andd mildly worse v stable since 2019  - Blood eos high c/w asthma - she has refused biologics  - Vit D some low  - vitamin D3 50,000 (50k) units once a week x 12 weeks,   - this cannot be vitamin d2. But has to to be Vit D3  - If pharmacy only has vitamin d2 let me know, then he should get replesta (spl vit d3) at same dose - replesta can be obtained by following instructions at [email or web address]

## 2021-05-08 LAB — IGE: IgE (Immunoglobulin E), Serum: 487 kU/L — ABNORMAL HIGH (ref <=114)

## 2021-05-08 NOTE — Telephone Encounter (Signed)
Attempted to call pt but unable to reach. Left message for her to return call. 

## 2021-05-08 NOTE — Progress Notes (Signed)
Known elevation in IgE but she refuses biologic for asthma.,  Can discuss at followup

## 2021-05-11 ENCOUNTER — Encounter: Payer: Self-pay | Admitting: *Deleted

## 2021-05-11 NOTE — Telephone Encounter (Signed)
Attempted to call pt but unable to reach. Left message for her to return call.  Due to multiple attempts trying to reach pt and unable to do so, per protocol letter will be sent to pt and encounter will be closed.

## 2021-05-17 ENCOUNTER — Other Ambulatory Visit: Payer: Self-pay | Admitting: Internal Medicine

## 2021-06-04 ENCOUNTER — Telehealth: Payer: Self-pay | Admitting: Internal Medicine

## 2021-06-04 NOTE — Telephone Encounter (Signed)
Pt stated that she is requesting for her lab results from 05/07/2021 to be sent to her. Pls regard; [phone]

## 2021-06-04 NOTE — Telephone Encounter (Signed)
Labs printed and mailed to patient

## 2021-07-04 ENCOUNTER — Other Ambulatory Visit: Payer: Self-pay | Admitting: Internal Medicine

## 2021-07-05 ENCOUNTER — Other Ambulatory Visit: Payer: Self-pay | Admitting: Internal Medicine

## 2021-09-25 ENCOUNTER — Ambulatory Visit (INDEPENDENT_AMBULATORY_CARE_PROVIDER_SITE_OTHER): Payer: Medicare Other | Admitting: Internal Medicine

## 2021-09-25 ENCOUNTER — Other Ambulatory Visit: Payer: Self-pay

## 2021-09-25 ENCOUNTER — Encounter: Payer: Self-pay | Admitting: Internal Medicine

## 2021-09-25 ENCOUNTER — Ambulatory Visit: Payer: Medicare Other

## 2021-09-25 VITALS — BP 130/80 | HR 90 | Resp 18 | Ht 64.0 in | Wt 159.0 lb

## 2021-09-25 DIAGNOSIS — E2839 Other primary ovarian failure: Secondary | ICD-10-CM | POA: Diagnosis not present

## 2021-09-25 DIAGNOSIS — E782 Mixed hyperlipidemia: Secondary | ICD-10-CM

## 2021-09-25 DIAGNOSIS — N182 Chronic kidney disease, stage 2 (mild): Secondary | ICD-10-CM | POA: Diagnosis not present

## 2021-09-25 DIAGNOSIS — R7309 Other abnormal glucose: Secondary | ICD-10-CM

## 2021-09-25 DIAGNOSIS — F419 Anxiety disorder, unspecified: Secondary | ICD-10-CM

## 2021-09-25 DIAGNOSIS — J4550 Severe persistent asthma, uncomplicated: Secondary | ICD-10-CM | POA: Diagnosis not present

## 2021-09-25 DIAGNOSIS — E559 Vitamin D deficiency, unspecified: Secondary | ICD-10-CM | POA: Diagnosis not present

## 2021-09-25 DIAGNOSIS — I10 Essential (primary) hypertension: Secondary | ICD-10-CM

## 2021-09-25 DIAGNOSIS — M810 Age-related osteoporosis without current pathological fracture: Secondary | ICD-10-CM

## 2021-09-25 MED ORDER — ALPRAZOLAM 0.25 MG PO TABS
0.2500 mg | ORAL_TABLET | Freq: Every day | ORAL | 0 refills | Status: DC | PRN
Start: 2021-09-25 — End: 2022-08-30

## 2021-09-25 MED ORDER — NEXIUM 40 MG PO CPDR: 40.0000 mg | DELAYED_RELEASE_CAPSULE | Freq: Every day | ORAL | 3 refills | Status: DC

## 2021-09-25 MED ORDER — AMLODIPINE BESYLATE 5 MG PO TABS: 5.0000 mg | ORAL_TABLET | Freq: Every day | ORAL | 3 refills | Status: DC

## 2021-09-25 MED ORDER — CVS D3 50 MCG (2000 UT) PO CAPS: 2000.0000 [IU] | ORAL_CAPSULE | Freq: Every day | ORAL | 3 refills | Status: DC

## 2021-09-25 NOTE — Progress Notes (Signed)
   Subjective:   Patient ID: [name] [name], female    DOB: 07-11-1941, 80 y.o.   MRN: [id number]  HPI The patient is a 80 YO female coming in for follow up.   Review of Systems  Constitutional: Negative.   HENT: Negative.    Eyes: Negative.   Respiratory:  Negative for cough, chest tightness and shortness of breath.   Cardiovascular:  Negative for chest pain, palpitations and leg swelling.  Gastrointestinal:  Negative for abdominal distention, abdominal pain, constipation, diarrhea, nausea and vomiting.  Musculoskeletal:  Positive for arthralgias.  Skin: Negative.   Neurological: Negative.   Psychiatric/Behavioral: Negative.     Objective:  Physical Exam Constitutional:      Appearance: She is well-developed.  HENT:     Head: Normocephalic and atraumatic.  Cardiovascular:     Rate and Rhythm: Normal rate and regular rhythm.  Pulmonary:     Effort: Pulmonary effort is normal. No respiratory distress.     Breath sounds: Normal breath sounds. No wheezing or rales.  Abdominal:     General: Bowel sounds are normal. There is no distension.     Palpations: Abdomen is soft.     Tenderness: There is no abdominal tenderness. There is no rebound.  Musculoskeletal:        General: Tenderness present.     Cervical back: Normal range of motion.  Skin:    General: Skin is warm and dry.  Neurological:     Mental Status: She is alert and oriented to person, place, and time.     Coordination: Coordination normal.    Vitals:   09/25/21 1543  BP: 130/80  Pulse: 90  Resp: 18  SpO2: 99%  Weight: 159 lb (72.1 kg)  Height: 5\' 4"  (1.626 m)    This visit occurred during the SARS-CoV-2 public health emergency.  Safety protocols were in place, including screening questions prior to the visit, additional usage of staff PPE, and extensive cleaning of exam room while observing appropriate contact time as indicated for disinfecting solutions.   Assessment & Plan:

## 2021-09-25 NOTE — Patient Instructions (Addendum)
We will order the labs to get done in December when you come back.

## 2021-09-29 NOTE — Assessment & Plan Note (Signed)
Checking CMP for stability.  

## 2021-09-29 NOTE — Assessment & Plan Note (Signed)
Refilled otc vitamin D today.

## 2021-09-29 NOTE — Assessment & Plan Note (Signed)
Needs DEXA ordered today for follow up. No current fracture.

## 2021-09-29 NOTE — Assessment & Plan Note (Signed)
Checking lipid panel and adjust as needed. Not on medication currently.  

## 2021-09-29 NOTE — Assessment & Plan Note (Signed)
No flare today using singulair and xopenex and symbicort.

## 2021-09-29 NOTE — Assessment & Plan Note (Signed)
BP at goal on amlodipine 5 mg daily. Checking CMP and adjust as needed.  

## 2021-09-29 NOTE — Assessment & Plan Note (Signed)
Refill xanax which she uses rarely.

## 2021-10-11 ENCOUNTER — Inpatient Hospital Stay: Admission: RE | Admit: 2021-10-11 | Payer: Medicare Other | Source: Ambulatory Visit

## 2021-10-25 ENCOUNTER — Telehealth: Payer: Self-pay | Admitting: Internal Medicine

## 2021-10-25 NOTE — Telephone Encounter (Signed)
The patient is not on chronic steroids.  Therefore steroids cannot be the cause of the mental health issues.  If her respiratory status is in distress then people could get anxious agitated because of respiratory status.  Beyond that I would not know how to help the situation.  Very unfortunate.  Patient is welcome to have an evaluation done by one of our nurse practitioners in the clinic as my schedule is full/not available in the next few weeks

## 2021-10-25 NOTE — Telephone Encounter (Signed)
Called and spoke to pt's daughter, [name] [name] (on [place]).Pt, [name] [name], lives with daughter [name] [name]. [name] [name] states the pt has been having manic episodes lately and has been increasingly agitated. [name] [name] is looking for recommendations for pt. [name] [name] states the pt has not been diagnosed with a behavioral disorder but feels the pt is bipolar. 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 Pt has been on anti-depressants in the past but is currently only on Xanax prn. [name] [name] is unsure what to do at this point as she states the pt and her PCP do not have a good relationship and pt no longer sees a psychiatrist. [name] for her to speak with the pt about getting a new PCP if their relationship (provider/patient) isnt well and to simultaneously get an appt with a new psychiatrist. [name] [name] did not say pt was a harm to herself or others. [name] [name] is looking for recommendations from [name] as per pt and [name] [name] the pt and MR have a really good relationship with mutual trust and respect.  Will forward to MR to advise further. Thank you!

## 2021-10-26 NOTE — Telephone Encounter (Signed)
ATC x1, no answer, LVM to return call. 

## 2021-10-30 NOTE — Telephone Encounter (Signed)
Called and spoke with pt's daughter [name] [name] to see how pt was doing and also to let her know info per MR. [name] [name] stated that pt was still having episodes. She said that pt has found a new PCP office that she was interested in but was not in network. Stated to her that they could try to call insurance to see if they might make an exception so pt could get an appt at that PCP office and she verbalized understanding. Nothing further needed.

## 2021-11-19 ENCOUNTER — Ambulatory Visit (INDEPENDENT_AMBULATORY_CARE_PROVIDER_SITE_OTHER)
Admission: RE | Admit: 2021-11-19 | Discharge: 2021-11-19 | Disposition: A | Discharge status: Home / Self Care | Payer: Medicare Other | Source: Ambulatory Visit | Attending: Internal Medicine | Admitting: Internal Medicine

## 2021-11-19 ENCOUNTER — Other Ambulatory Visit (INDEPENDENT_AMBULATORY_CARE_PROVIDER_SITE_OTHER): Payer: Medicare Other

## 2021-11-19 ENCOUNTER — Other Ambulatory Visit: Payer: Self-pay

## 2021-11-19 DIAGNOSIS — E2839 Other primary ovarian failure: Secondary | ICD-10-CM | POA: Diagnosis not present

## 2021-11-19 DIAGNOSIS — I10 Essential (primary) hypertension: Secondary | ICD-10-CM

## 2021-11-19 DIAGNOSIS — E782 Mixed hyperlipidemia: Secondary | ICD-10-CM | POA: Diagnosis not present

## 2021-11-19 DIAGNOSIS — R7309 Other abnormal glucose: Secondary | ICD-10-CM | POA: Diagnosis not present

## 2021-11-19 LAB — CBC
HCT: 36.6 % (ref 36.0–46.0)
Hemoglobin: 11.7 g/dL — ABNORMAL LOW (ref 12.0–15.0)
MCHC: 31.9 g/dL (ref 30.0–36.0)
MCV: 86 fl (ref 78.0–100.0)
Platelets: 207 10*3/uL (ref 150.0–400.0)
RBC: 4.26 Mil/uL (ref 3.87–5.11)
RDW: 13.6 % (ref 11.5–15.5)
WBC: 6.6 10*3/uL (ref 4.0–10.5)

## 2021-11-19 LAB — LIPID PANEL
Cholesterol: 215 mg/dL — ABNORMAL HIGH (ref 0–200)
HDL: 51.8 mg/dL (ref >39.00)
LDL Cholesterol: 144 mg/dL — ABNORMAL HIGH (ref 0–99)
NonHDL: 163.25
Total CHOL/HDL Ratio: 4
Triglycerides: 94 mg/dL (ref 0.0–149.0)
VLDL: 18.8 mg/dL (ref 0.0–40.0)

## 2021-11-19 LAB — COMPREHENSIVE METABOLIC PANEL
ALT: 11 U/L (ref 0–35)
AST: 17 U/L (ref 0–37)
Albumin: 3.8 g/dL (ref 3.5–5.2)
Alkaline Phosphatase: 67 U/L (ref 39–117)
BUN: 16 mg/dL (ref 6–23)
CO2: 26 mEq/L (ref 19–32)
Calcium: 10.2 mg/dL (ref 8.4–10.5)
Chloride: 107 mEq/L (ref 96–112)
Creatinine, Ser: 1.14 mg/dL (ref 0.40–1.20)
GFR: 46.03 mL/min — ABNORMAL LOW (ref >60.00)
Glucose, Bld: 88 mg/dL (ref 70–99)
Potassium: 4.1 mEq/L (ref 3.5–5.1)
Sodium: 138 mEq/L (ref 135–145)
Total Bilirubin: 0.5 mg/dL (ref 0.2–1.2)
Total Protein: 7.6 g/dL (ref 6.0–8.3)

## 2021-11-20 LAB — HEMOGLOBIN A1C: Hgb A1c MFr Bld: 5.8 % (ref 4.6–6.5)

## 2021-11-28 ENCOUNTER — Other Ambulatory Visit: Payer: Self-pay

## 2021-11-28 ENCOUNTER — Ambulatory Visit (INDEPENDENT_AMBULATORY_CARE_PROVIDER_SITE_OTHER): Payer: Medicare Other

## 2021-11-28 VITALS — BP 130/80 | HR 66 | Temp 97.6°F | Resp 16 | Ht 64.0 in | Wt 155.0 lb

## 2021-11-28 DIAGNOSIS — Z0000 Encounter for general adult medical examination without abnormal findings: Secondary | ICD-10-CM
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
## 2021-11-28 NOTE — Patient Instructions (Signed)
[name] [name] , Thank you for taking time to come for your Medicare Wellness Visit. I appreciate your ongoing commitment to your health goals. Please review the following plan we discussed and let me know if I can assist you in the future.   Screening recommendations/referrals: Colonoscopy: 03/29/2014; due every 5 years; discontinue at age 81 Mammogram: 06/05/2018; due every year Bone Density: 11/19/2021; due every 2 years Recommended yearly ophthalmology/optometry visit for glaucoma screening and checkup Recommended yearly dental visit for hygiene and checkup  Vaccinations: Influenza vaccine: declined Pneumococcal vaccine: 12/25/2010, 09/20/2013 Tdap vaccine: declined Shingles vaccine: declined Covid-19: declined  Advanced directives: Advance directive discussed with you today. Even though you declined this today please call our office should you change your mind and we can give you the proper paperwork for you to fill out.  Conditions/risks identified: Yes; Client understands the importance of follow-up with providers by attending scheduled visits and discussed goals to eat healthier, increase physical activity, exercise the brain, socialize more, get enough sleep and make time for laughter.  Next appointment: Please schedule your next Medicare Wellness Visit with your Nurse Health Advisor in 1 year by calling [phone].   Preventive Care 105 Years and Older, Female Preventive care refers to lifestyle choices and visits with your health care provider that can promote health and wellness. What does preventive care include? A yearly physical exam. This is also called an annual well check. Dental exams once or twice a year. Routine eye exams. Ask your health care provider how often you should have your eyes checked. Personal lifestyle choices, including: Daily care of your teeth and gums. Regular physical activity. Eating a healthy diet. Avoiding tobacco and drug use. Limiting alcohol  use. Practicing safe sex. Taking low-dose aspirin every day. Taking vitamin and mineral supplements as recommended by your health care provider. What happens during an annual well check? The services and screenings done by your health care provider during your annual well check will depend on your age, overall health, lifestyle risk factors, and family history of disease. Counseling  Your health care provider may ask you questions about your: Alcohol use. Tobacco use. Drug use. Emotional well-being. Home and relationship well-being. Sexual activity. Eating habits. History of falls. Memory and ability to understand (cognition). Work and work Statistician. Reproductive health. Screening  You may have the following tests or measurements: Height, weight, and BMI. Blood pressure. Lipid and cholesterol levels. These may be checked every 5 years, or more frequently if you are over 26 years old. Skin check. Lung cancer screening. You may have this screening every year starting at age 1 if you have a 30-pack-year history of smoking and currently smoke or have quit within the past 15 years. Fecal occult blood test (FOBT) of the stool. You may have this test every year starting at age 12. Flexible sigmoidoscopy or colonoscopy. You may have a sigmoidoscopy every 5 years or a colonoscopy every 10 years starting at age 4. Hepatitis C blood test. Hepatitis B blood test. Sexually transmitted disease (STD) testing. Diabetes screening. This is done by checking your blood sugar (glucose) after you have not eaten for a while (fasting). You may have this done every 1-3 years. Bone density scan. This is done to screen for osteoporosis. You may have this done starting at age 16. Mammogram. This may be done every 1-2 years. Talk to your health care provider about how often you should have regular mammograms. Talk with your health care provider about your test results, treatment  options, and if necessary,  the need for more tests. Vaccines  Your health care provider may recommend certain vaccines, such as: Influenza vaccine. This is recommended every year. Tetanus, diphtheria, and acellular pertussis (Tdap, Td) vaccine. You may need a Td booster every 10 years. Zoster vaccine. You may need this after age 72. Pneumococcal 13-valent conjugate (PCV13) vaccine. One dose is recommended after age 37. Pneumococcal polysaccharide (PPSV23) vaccine. One dose is recommended after age 52. Talk to your health care provider about which screenings and vaccines you need and how often you need them. This information is not intended to replace advice given to you by your health care provider. Make sure you discuss any questions you have with your health care provider. Document Released: 11/24/2015 Document Revised: 07/17/2016 Document Reviewed: 08/29/2015 Elsevier Interactive Patient Education  2017 [employer] Prevention in the Home Falls can cause injuries. They can happen to people of all ages. There are many things you can do to make your home safe and to help prevent falls. What can I do on the outside of my home? Regularly fix the edges of walkways and driveways and fix any cracks. Remove anything that might make you trip as you walk through a door, such as a raised step or threshold. Trim any bushes or trees on the path to your home. Use bright outdoor lighting. Clear any walking paths of anything that might make someone trip, such as rocks or tools. Regularly check to see if handrails are loose or broken. Make sure that both sides of any steps have handrails. Any raised decks and porches should have guardrails on the edges. Have any leaves, snow, or ice cleared regularly. Use sand or salt on walking paths during winter. Clean up any spills in your garage right away. This includes oil or grease spills. What can I do in the bathroom? Use night lights. Install grab bars by the toilet and in the  tub and shower. Do not use towel bars as grab bars. Use non-skid mats or decals in the tub or shower. If you need to sit down in the shower, use a plastic, non-slip stool. Keep the floor dry. Clean up any water that spills on the floor as soon as it happens. Remove soap buildup in the tub or shower regularly. Attach bath mats securely with double-sided non-slip rug tape. Do not have throw rugs and other things on the floor that can make you trip. What can I do in the bedroom? Use night lights. Make sure that you have a light by your bed that is easy to reach. Do not use any sheets or blankets that are too big for your bed. They should not hang down onto the floor. Have a firm chair that has side arms. You can use this for support while you get dressed. Do not have throw rugs and other things on the floor that can make you trip. What can I do in the kitchen? Clean up any spills right away. Avoid walking on wet floors. Keep items that you use a lot in easy-to-reach places. If you need to reach something above you, use a strong step stool that has a grab bar. Keep electrical cords out of the way. Do not use floor polish or wax that makes floors slippery. If you must use wax, use non-skid floor wax. Do not have throw rugs and other things on the floor that can make you trip. What can I do with my stairs? Do not  leave any items on the stairs. Make sure that there are handrails on both sides of the stairs and use them. Fix handrails that are broken or loose. Make sure that handrails are as long as the stairways. Check any carpeting to make sure that it is firmly attached to the stairs. Fix any carpet that is loose or worn. Avoid having throw rugs at the top or bottom of the stairs. If you do have throw rugs, attach them to the floor with carpet tape. Make sure that you have a light switch at the top of the stairs and the bottom of the stairs. If you do not have them, ask someone to add them for  you. What else can I do to help prevent falls? Wear shoes that: Do not have high heels. Have rubber bottoms. Are comfortable and fit you well. Are closed at the toe. Do not wear sandals. If you use a stepladder: Make sure that it is fully opened. Do not climb a closed stepladder. Make sure that both sides of the stepladder are locked into place. Ask someone to hold it for you, if possible. Clearly mark and make sure that you can see: Any grab bars or handrails. First and last steps. Where the edge of each step is. Use tools that help you move around (mobility aids) if they are needed. These include: Canes. Walkers. Scooters. Crutches. Turn on the lights when you go into a dark area. Replace any light bulbs as soon as they burn out. Set up your furniture so you have a clear path. Avoid moving your furniture around. If any of your floors are uneven, fix them. If there are any pets around you, be aware of where they are. Review your medicines with your doctor. Some medicines can make you feel dizzy. This can increase your chance of falling. Ask your doctor what other things that you can do to help prevent falls. This information is not intended to replace advice given to you by your health care provider. Make sure you discuss any questions you have with your health care provider. Document Released: 08/24/2009 Document Revised: 04/04/2016 Document Reviewed: 12/02/2014 Elsevier Interactive Patient Education  2017 [employer].

## 2021-12-02 ENCOUNTER — Telehealth: Payer: Self-pay | Admitting: Internal Medicine

## 2021-12-02 ENCOUNTER — Other Ambulatory Visit: Payer: Self-pay | Admitting: Internal Medicine

## 2021-12-02 MED ORDER — AZITHROMYCIN 250 MG PO TABS: ORAL_TABLET | ORAL | 0 refills | Status: DC

## 2021-12-02 MED ORDER — XOPENEX 0.63 MG/3ML IN NEBU: 0.6300 mg | INHALATION_SOLUTION | RESPIRATORY_TRACT | 0 refills | Status: DC | PRN

## 2021-12-02 MED ORDER — PREDNISONE 10 MG PO TABS: ORAL_TABLET | ORAL | 0 refills | Status: DC

## 2021-12-02 NOTE — Telephone Encounter (Signed)
Ran out of xopenex neb 2 days ago and says called [facility] but they have not supplied it.  Was aleady having fare x sev days with cough and sob at rest /no cp or fever or purulent mucus  Rec Sent in  Refill neb x one m Zpak Prednisone 10 mg take  4 each am x 2 days,   2 each am x 2 days,  1 each am x 2 days and stop   F/u by NP w/in a week

## 2021-12-03 NOTE — Telephone Encounter (Signed)
Attempted to call pt but unable to reach. Left message for her to return call  When pt returns call, please schedule pt a f/u in 1 week with NP.

## 2022-01-09 ENCOUNTER — Other Ambulatory Visit: Payer: Self-pay | Admitting: Internal Medicine

## 2022-02-07 DIAGNOSIS — Z20822 Contact with and (suspected) exposure to covid-19: Secondary | ICD-10-CM | POA: Diagnosis not present

## 2022-02-27 ENCOUNTER — Other Ambulatory Visit: Payer: Self-pay | Admitting: Internal Medicine

## 2022-03-06 DIAGNOSIS — Z20822 Contact with and (suspected) exposure to covid-19: Secondary | ICD-10-CM | POA: Diagnosis not present

## 2022-03-20 DIAGNOSIS — Z20822 Contact with and (suspected) exposure to covid-19: Secondary | ICD-10-CM | POA: Diagnosis not present

## 2022-04-12 ENCOUNTER — Telehealth: Payer: Self-pay | Admitting: Internal Medicine

## 2022-04-12 MED ORDER — ALBUTEROL SULFATE (2.5 MG/3ML) 0.083% IN NEBU: INHALATION_SOLUTION | RESPIRATORY_TRACT | 2 refills | Status: DC

## 2022-04-12 MED ORDER — AZITHROMYCIN 250 MG PO TABS: ORAL_TABLET | ORAL | 0 refills | Status: DC

## 2022-04-12 MED ORDER — PREDNISONE 10 MG PO TABS: ORAL_TABLET | ORAL | 0 refills | Status: DC

## 2022-04-12 NOTE — Telephone Encounter (Signed)
Sick for  2-3 days cough/ wheeze "just like always"   Zpak Prednisone 10 mg take  4 each am x 2 days,   2 each am x 2 days,  1 each am x 2 days and stop  Albuterol one half vial q 4 h prn  Make sure has f/u appt with NP w/in a week to regroup re maint rx - be sure brings all meds to ov

## 2022-04-12 NOTE — Telephone Encounter (Signed)
Pt called answering service around 6pm  and gave a number with no answer, and the mobile one on record is not answered either. The home phone is no longer in service

## 2022-04-15 NOTE — Telephone Encounter (Signed)
Called number on file and it was Carols daughter. (Ok per Assia to speak to daughter). Went over medication recommendations and verified pharmacy.   The daughter states they will call back to make a follow up appt.  Nothing further needed at this time.

## 2022-05-03 ENCOUNTER — Other Ambulatory Visit: Payer: Self-pay | Admitting: Internal Medicine

## 2022-05-07 ENCOUNTER — Other Ambulatory Visit: Payer: Self-pay | Admitting: Internal Medicine

## 2022-05-07 ENCOUNTER — Ambulatory Visit (INDEPENDENT_AMBULATORY_CARE_PROVIDER_SITE_OTHER): Payer: Medicare Other | Admitting: Internal Medicine

## 2022-05-07 ENCOUNTER — Encounter: Payer: Self-pay | Admitting: Internal Medicine

## 2022-05-07 VITALS — BP 132/74 | HR 67 | Resp 18 | Ht 64.0 in | Wt 153.2 lb

## 2022-05-07 DIAGNOSIS — J4551 Severe persistent asthma with (acute) exacerbation: Secondary | ICD-10-CM | POA: Diagnosis not present

## 2022-05-07 DIAGNOSIS — N3946 Mixed incontinence: Secondary | ICD-10-CM

## 2022-05-07 DIAGNOSIS — R32 Unspecified urinary incontinence: Secondary | ICD-10-CM | POA: Insufficient documentation

## 2022-05-07 HISTORY — DX: Unspecified urinary incontinence: R32

## 2022-05-07 MED ORDER — EPINEPHRINE 0.3 MG/0.3ML IJ SOAJ: INTRAMUSCULAR | 0 refills | Status: DC

## 2022-05-07 MED ORDER — XOPENEX 0.63 MG/3ML IN NEBU
0.6300 mg | INHALATION_SOLUTION | RESPIRATORY_TRACT | 0 refills | Status: DC | PRN
Start: 2022-05-07 — End: 2023-02-20

## 2022-05-07 MED ORDER — PREDNISONE 10 MG PO TABS
ORAL_TABLET | ORAL | 0 refills | Status: DC
Start: 2022-05-07 — End: 2022-05-23

## 2022-05-07 NOTE — Assessment & Plan Note (Signed)
Needs refill on supplies and they will fax [place] paperwork. Will continue these.

## 2022-05-07 NOTE — Progress Notes (Signed)
   Subjective:   Patient ID: [name] [name], female    DOB: 1941/03/19, 81 y.o.   MRN: [id number]  HPI The patient is a 81 YO female coming in for follow up.  Review of Systems  Constitutional: Negative.   HENT: Negative.    Eyes: Negative.   Respiratory:  Positive for cough, chest tightness and shortness of breath.   Cardiovascular:  Negative for chest pain, palpitations and leg swelling.  Gastrointestinal:  Negative for abdominal distention, abdominal pain, constipation, diarrhea, nausea and vomiting.  Musculoskeletal: Negative.   Skin: Negative.   Neurological: Negative.   Psychiatric/Behavioral: Negative.      Objective:  Physical Exam Constitutional:      Appearance: She is well-developed.  HENT:     Head: Normocephalic and atraumatic.  Cardiovascular:     Rate and Rhythm: Normal rate and regular rhythm.  Pulmonary:     Effort: Pulmonary effort is normal. No respiratory distress.     Breath sounds: Wheezing present. No rales.  Abdominal:     General: Bowel sounds are normal. There is no distension.     Palpations: Abdomen is soft.     Tenderness: There is no abdominal tenderness. There is no rebound.  Musculoskeletal:     Cervical back: Normal range of motion.  Skin:    General: Skin is warm and dry.  Neurological:     Mental Status: She is alert and oriented to person, place, and time.     Coordination: Coordination normal.     Vitals:   05/07/22 1449  BP: 132/74  Pulse: 67  Resp: 18  SpO2: 97%  Weight: 153 lb 3.2 oz (69.5 kg)  Height: '5\' 4"'$  (1.626 m)    Assessment & Plan:

## 2022-05-10 NOTE — Assessment & Plan Note (Signed)
Having flare. Rx prednisone and refilled xopenex nebulizer solution to her pharmacy. She will continue to follow with pulmonary.

## 2022-05-23 ENCOUNTER — Ambulatory Visit (INDEPENDENT_AMBULATORY_CARE_PROVIDER_SITE_OTHER): Payer: Medicare Other | Admitting: Adult Health

## 2022-05-23 ENCOUNTER — Ambulatory Visit: Payer: Medicare Other | Admitting: Adult Health

## 2022-05-23 ENCOUNTER — Encounter: Payer: Self-pay | Admitting: Adult Health

## 2022-05-23 DIAGNOSIS — J4550 Severe persistent asthma, uncomplicated: Secondary | ICD-10-CM | POA: Diagnosis not present

## 2022-05-23 DIAGNOSIS — J309 Allergic rhinitis, unspecified: Secondary | ICD-10-CM

## 2022-05-23 NOTE — Patient Instructions (Addendum)
Continue on Symbicort 2 puffs twice daily, rinse after use Continue on Singulair daily  Xopenex as needed for wheezing shortness of breath Follow-up with [name] as planned and As needed

## 2022-05-23 NOTE — Progress Notes (Signed)
$'@Patient'w$  ID: [name] [name], female    DOB: 08-Dec-1940, 81 y.o.   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  Patient says overall asthma is doing okay without flare of cough or wheezing.  She is maintained on Symbicort twice daily.  She takes Singulair daily.  Says she has had no increased Xopenex use.  She has completed pulmonary rehab.  Feels that this really helped her.  She misses going to this.  Patient says she tries to stay active does some light activities and exercises at home. Says she has been under some stress with family.  Her son recently had a stroke.  She has had some friends that are passed away and is somewhat sad about this.  Support was provided.   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   Latex Anaphylaxis and Swelling   Albuterol Anxiety    Switched to xopenex   Advair Diskus [Fluticasone-Salmeterol]     Tingling in mouth/ears ringing   Diclofenac Sodium     REACTION: Hives   Diclofenac Sodium Swelling   Doxycycline Nausea And Vomiting   Dulera [Mometasone Furo-Formoterol Fum]     HA   Other Swelling   Penicillins Swelling    Tongue swelling   Sulfonamide Derivatives     Tongue swelling    Tiotropium Itching and Other (See Comments)    dysuria   Tiotropium Bromide Monohydrate     Tongue/mouth itching Dysuria     Immunization History  Administered Date(s) Administered   Pneumococcal Conjugate-13 09/20/2013   Pneumococcal Polysaccharide-23 12/25/2010   Td 01/09/2005    Past Medical History:  Diagnosis Date   Allergic asthma    h/o   ALLERGIC RHINITIS    Anxiety    Bronchiectasis    h/o   Chronic obstructive  asthma    PFT 11/06/10 - FEV1 1.24/ 0.62; FEV1/FVC 0.56, TLC 0.78; DLCO 0.75   Chronic rhinosinusitis    Colon polyp, hyperplastic 02/2003, 03/2014   COPD (chronic obstructive pulmonary disease) ([place])    Gastroparesis 2010   GERD (gastroesophageal reflux disease)    Helicobacter pylori gastritis 02/2009   partially treated   Hiatal hernia    Hyperlipidemia    Hypertension    Osteoporosis    Sleep apnea     Tobacco History: Social History   Tobacco Use  Smoking Status Former   Years: 1.00   Types: Cigarettes   Quit date: 11/11/1962   Years since quitting: 59.5  Smokeless Tobacco Never  Tobacco Comments   socially   Counseling given: Not Answered Tobacco comments: socially   Outpatient Medications Prior to Visit  Medication Sig Dispense Refill   ALPRAZolam (XANAX) 0.25 MG tablet Take 1 tablet (0.25 mg total) by mouth daily as needed for anxiety. 30 tablet 0   amLODipine (NORVASC) 5 MG tablet Take 1 tablet (5 mg total) by mouth daily. 90 tablet 3   Cholecalciferol ([employer] D3) 50 MCG (2000 UT) CAPS Take 1 capsule (2,000 Units total) by mouth daily. 90 capsule 3   EPINEPHrine 0.3 mg/0.3 mL IJ SOAJ injection INJECT 0.3 MLS INTO THE MUSCLE ONCE FOR 1 DOSE. 2 each 0  montelukast (SINGULAIR) 10 MG tablet Take 1 tablet (10 mg total) by mouth at bedtime. 90 tablet 1   NEXIUM 40 MG capsule Take 1 capsule (40 mg total) by mouth daily at 12 noon. 90 capsule 3   Olopatadine HCl (PATADAY OP) Apply 1 drop to eye daily.     SYMBICORT 160-4.5 MCG/ACT inhaler INHALE 2 PUFFS INTO THE LUNGS IN THE MORNING AND AT BEDTIME. 30.6 each 3   XOPENEX 0.63 MG/3ML nebulizer solution Take 3 mLs (0.63 mg total) by nebulization every 4 (four) hours as needed for wheezing or shortness of breath. 360 mL 0   XOPENEX HFA 45 MCG/ACT inhaler INHALE 1 TO 2 PUFFS BY MOUTH EVERY 6 HOURS AS NEEDED FOR WHEEZE 45 each 1   predniSONE (DELTASONE) 10 MG tablet Take  4 each am x 2 days,   2 each am x 2 days,  1 each am x 2 days  and stop 14 tablet 0   No facility-administered medications prior to visit.     Review of Systems:   Constitutional:   No  weight loss, night sweats,  Fevers, chills, fatigue, or  lassitude.  HEENT:   No headaches,  Difficulty swallowing,  Tooth/dental problems, or  Sore throat,                No sneezing, itching, ear ache,  +nasal congestion, post nasal drip,   CV:  No chest pain,  Orthopnea, PND, swelling in lower extremities, anasarca, dizziness, palpitations, syncope.   GI  No heartburn, indigestion, abdominal pain, nausea, vomiting, diarrhea, change in bowel habits, loss of appetite, bloody stools.   Resp: .  No excess mucus, no productive cough,  No non-productive cough,  No coughing up of blood.  No change in color of mucus.  No wheezing.  No chest wall deformity  Skin: no rash or lesions.  GU: no dysuria, change in color of urine, no urgency or frequency.  No flank pain, no hematuria   MS:  No joint pain or swelling.  No decreased range of motion.  No back pain.    Physical Exam  BP (!) 174/100 (BP Location: Left Arm, Cuff Size: Normal)   Pulse 83   Temp 98.8 F (37.1 C) (Oral)   Ht 5' 0.75" (1.543 m)   Wt 155 lb (70.3 kg)   SpO2 100% Comment: on RA  BMI 29.53 kg/m   GEN: A/Ox3; pleasant , NAD, well nourished    HEENT:  [place]/AT,  NOSE-clear, THROAT-clear, no lesions, no postnasal drip or exudate noted.   NECK:  Supple w/ fair ROM; no JVD; normal carotid impulses w/o bruits; no thyromegaly or nodules palpated; no lymphadenopathy.    RESP  Clear  P & A; w/o, wheezes/ rales/ or rhonchi. no accessory muscle use, no dullness to percussion  CARD:  RRR, no m/r/g, no peripheral edema, pulses intact, no cyanosis or clubbing.  GI:   Soft & nt; nml bowel sounds; no organomegaly or masses detected.   Musco: Warm bil, no deformities or joint swelling noted.   Neuro: alert, no focal deficits noted.    Skin: Warm, no lesions or rashes    Lab Results:  CBC     Component Value Date/Time   WBC 6.6 11/19/2021 1411   RBC 4.26 11/19/2021 1411   HGB 11.7 (L) 11/19/2021 1411   HCT 36.6 11/19/2021 1411   PLT 207.0 11/19/2021 1411   MCV 86.0 11/19/2021 1411   MCH 27.5 10/24/2015 1000   MCHC  31.9 11/19/2021 1411   RDW 13.6 11/19/2021 1411   LYMPHSABS 1.9 05/07/2021 1103   MONOABS 0.5 05/07/2021 1103   EOSABS 0.9 (H) 05/07/2021 1103   BASOSABS 0.1 05/07/2021 1103    BMET    Component Value Date/Time   NA 138 11/19/2021 1411   NA 142 03/09/2018 1216   K 4.1 11/19/2021 1411   CL 107 11/19/2021 1411   CO2 26 11/19/2021 1411   GLUCOSE 88 11/19/2021 1411   BUN 16 11/19/2021 1411   BUN 14 03/09/2018 1216   CREATININE 1.14 11/19/2021 1411   CALCIUM 10.2 11/19/2021 1411   GFRNONAA 45 (L) 03/09/2018 1216   GFRAA 52 (L) 03/09/2018 1216    BNP No results found for: "BNP"  ProBNP    Component Value Date/Time   PROBNP 14.1 10/21/2011 1552    Imaging: No results found.       Latest Ref Rng & Units 12/20/2015   12:06 PM  PFT Results  FVC-Pre L 2.14  P  FVC-Predicted Pre % 109  P  FVC-Post L 2.15  P  FVC-Predicted Post % 110  P  Pre FEV1/FVC % % 75  P  Post FEV1/FCV % % 78  P  FEV1-Pre L 1.61  P  FEV1-Predicted Pre % 107  P  FEV1-Post L 1.67  P  DLCO uncorrected ml/min/mmHg 18.01  P  DLCO UNC% % 89  P  DLVA Predicted % 104  P  TLC L 4.72  P  TLC % Predicted % 102  P  RV % Predicted % 111  P    P Preliminary result    Lab Results  Component Value Date   NITRICOXIDE 63 06/01/2018        Assessment & Plan:   No problem-specific Assessment & Plan notes found for this encounter.     Rexene Edison, NP 05/23/2022

## 2022-05-27 NOTE — Assessment & Plan Note (Signed)
Controlled on current regimen  Plan  Patient Instructions  Continue on Symbicort 2 puffs twice daily, rinse after use Continue on Singulair daily  Xopenex as needed for wheezing shortness of breath Follow-up with [name] as planned and As needed

## 2022-05-27 NOTE — Assessment & Plan Note (Signed)
Controlled on present regimen   Plan  Patient Instructions  Continue on Symbicort 2 puffs twice daily, rinse after use Continue on Singulair daily  Xopenex as needed for wheezing shortness of breath Follow-up with [name] as planned and As needed

## 2022-06-12 DIAGNOSIS — H10413 Chronic giant papillary conjunctivitis, bilateral: Secondary | ICD-10-CM | POA: Diagnosis not present

## 2022-06-12 DIAGNOSIS — H43393 Other vitreous opacities, bilateral: Secondary | ICD-10-CM | POA: Diagnosis not present

## 2022-06-12 DIAGNOSIS — H26492 Other secondary cataract, left eye: Secondary | ICD-10-CM | POA: Diagnosis not present

## 2022-06-12 DIAGNOSIS — H04123 Dry eye syndrome of bilateral lacrimal glands: Secondary | ICD-10-CM | POA: Diagnosis not present

## 2022-06-12 DIAGNOSIS — Z961 Presence of intraocular lens: Secondary | ICD-10-CM | POA: Diagnosis not present

## 2022-07-30 ENCOUNTER — Ambulatory Visit (INDEPENDENT_AMBULATORY_CARE_PROVIDER_SITE_OTHER): Payer: Medicare Other | Admitting: Internal Medicine

## 2022-07-30 ENCOUNTER — Encounter: Payer: Self-pay | Admitting: Internal Medicine

## 2022-07-30 VITALS — BP 128/74 | HR 79 | Temp 97.2°F | Ht 63.5 in | Wt 149.6 lb

## 2022-07-30 DIAGNOSIS — J45901 Unspecified asthma with (acute) exacerbation: Secondary | ICD-10-CM

## 2022-07-30 DIAGNOSIS — J4550 Severe persistent asthma, uncomplicated: Secondary | ICD-10-CM

## 2022-07-30 DIAGNOSIS — D721 Eosinophilia, unspecified: Secondary | ICD-10-CM | POA: Diagnosis not present

## 2022-07-30 LAB — POCT EXHALED NITRIC OXIDE: FeNO level (ppb): 85

## 2022-07-30 NOTE — Patient Instructions (Addendum)
ICD-10-CM   1. Persistent asthma with acute exacerbation, unspecified asthma severity  J45.901     2. Severe persistent asthma without complication  J45.50 POCT EXHALED NITRIC OXIDE    3. Eosinophilia, unspecified type  D72.10        In asthma flare  Plan  - check cbc with diff  - start FASENRA - continue symbicort schedule and singulair scheduled wth albuterol as needed - Take prednisone 40 mg daily x 2 days, then 20mg daily x 2 days, then 10mg daily x 2 days, then 5mg daily x 2 days and stop   Follow-up -10-12 weeks or sooner with Dr. Lorelee Mclaurin  - ACT test and feno at followup  - face to face 
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
## 2022-07-31 ENCOUNTER — Other Ambulatory Visit: Payer: Self-pay

## 2022-07-31 ENCOUNTER — Other Ambulatory Visit (INDEPENDENT_AMBULATORY_CARE_PROVIDER_SITE_OTHER): Payer: Medicare Other

## 2022-07-31 DIAGNOSIS — J4550 Severe persistent asthma, uncomplicated: Secondary | ICD-10-CM

## 2022-07-31 LAB — CBC WITH DIFFERENTIAL/PLATELET
Basophils Absolute: 0.2 10*3/uL — ABNORMAL HIGH (ref 0.0–0.1)
Basophils Relative: 3.7 % — ABNORMAL HIGH (ref 0.0–3.0)
Eosinophils Absolute: 0.7 10*3/uL (ref 0.0–0.7)
Eosinophils Relative: 11.5 % — ABNORMAL HIGH (ref 0.0–5.0)
HCT: 36.7 % (ref 36.0–46.0)
Hemoglobin: 11.8 g/dL — ABNORMAL LOW (ref 12.0–15.0)
Lymphocytes Relative: 23.3 % (ref 12.0–46.0)
Lymphs Abs: 1.4 10*3/uL (ref 0.7–4.0)
MCHC: 32.2 g/dL (ref 30.0–36.0)
MCV: 87.1 fl (ref 78.0–100.0)
Monocytes Absolute: 0.5 10*3/uL (ref 0.1–1.0)
Monocytes Relative: 8.2 % (ref 3.0–12.0)
Neutro Abs: 3.1 10*3/uL (ref 1.4–7.7)
Neutrophils Relative %: 53.3 % (ref 43.0–77.0)
Platelets: 213 10*3/uL (ref 150.0–400.0)
RBC: 4.22 Mil/uL (ref 3.87–5.11)
RDW: 13.6 % (ref 11.5–15.5)
WBC: 5.9 10*3/uL (ref 4.0–10.5)

## 2022-07-31 NOTE — Addendum Note (Signed)
Addended by: Valerie Salts on: 07/31/2022 12:16 PM   Modules accepted: Orders

## 2022-08-06 ENCOUNTER — Ambulatory Visit: Payer: Medicare Other | Admitting: Internal Medicine

## 2022-08-29 DIAGNOSIS — R442 Other hallucinations: Secondary | ICD-10-CM | POA: Diagnosis not present

## 2022-08-29 DIAGNOSIS — I10 Essential (primary) hypertension: Secondary | ICD-10-CM | POA: Diagnosis not present

## 2022-08-29 DIAGNOSIS — R404 Transient alteration of awareness: Secondary | ICD-10-CM | POA: Diagnosis not present

## 2022-08-29 DIAGNOSIS — R0689 Other abnormalities of breathing: Secondary | ICD-10-CM | POA: Diagnosis not present

## 2022-08-29 DIAGNOSIS — F29 Unspecified psychosis not due to a substance or known physiological condition: Secondary | ICD-10-CM | POA: Diagnosis not present

## 2022-08-30 ENCOUNTER — Other Ambulatory Visit: Payer: Self-pay

## 2022-08-30 ENCOUNTER — Inpatient Hospital Stay (HOSPITAL_COMMUNITY)
Admission: EM | Admit: 2022-08-30 | Discharge: 2022-09-04 | DRG: 683 | Disposition: A | Discharge status: Home / Self Care | Payer: Medicare Other | Attending: Internal Medicine | Admitting: Internal Medicine

## 2022-08-30 ENCOUNTER — Emergency Department (HOSPITAL_COMMUNITY): Payer: Medicare Other

## 2022-08-30 ENCOUNTER — Observation Stay (HOSPITAL_COMMUNITY): Payer: Medicare Other

## 2022-08-30 ENCOUNTER — Encounter (HOSPITAL_COMMUNITY): Payer: Self-pay | Admitting: Internal Medicine

## 2022-08-30 DIAGNOSIS — G4700 Insomnia, unspecified: Secondary | ICD-10-CM | POA: Diagnosis present

## 2022-08-30 DIAGNOSIS — F29 Unspecified psychosis not due to a substance or known physiological condition: Secondary | ICD-10-CM

## 2022-08-30 DIAGNOSIS — Z7951 Long term (current) use of inhaled steroids: Secondary | ICD-10-CM

## 2022-08-30 DIAGNOSIS — Z9151 Personal history of suicidal behavior: Secondary | ICD-10-CM | POA: Diagnosis not present

## 2022-08-30 DIAGNOSIS — Z887 Allergy status to serum and vaccine status: Secondary | ICD-10-CM

## 2022-08-30 DIAGNOSIS — N179 Acute kidney failure, unspecified: Principal | ICD-10-CM | POA: Diagnosis present

## 2022-08-30 DIAGNOSIS — J441 Chronic obstructive pulmonary disease with (acute) exacerbation: Secondary | ICD-10-CM | POA: Diagnosis present

## 2022-08-30 DIAGNOSIS — E785 Hyperlipidemia, unspecified: Secondary | ICD-10-CM | POA: Diagnosis present

## 2022-08-30 DIAGNOSIS — F23 Brief psychotic disorder: Secondary | ICD-10-CM | POA: Diagnosis present

## 2022-08-30 DIAGNOSIS — Z1152 Encounter for screening for COVID-19: Secondary | ICD-10-CM | POA: Diagnosis not present

## 2022-08-30 DIAGNOSIS — N390 Urinary tract infection, site not specified: Secondary | ICD-10-CM | POA: Diagnosis present

## 2022-08-30 DIAGNOSIS — Z880 Allergy status to penicillin: Secondary | ICD-10-CM | POA: Diagnosis not present

## 2022-08-30 DIAGNOSIS — Z87891 Personal history of nicotine dependence: Secondary | ICD-10-CM

## 2022-08-30 DIAGNOSIS — Z888 Allergy status to other drugs, medicaments and biological substances status: Secondary | ICD-10-CM

## 2022-08-30 DIAGNOSIS — F05 Delirium due to known physiological condition: Secondary | ICD-10-CM | POA: Diagnosis present

## 2022-08-30 DIAGNOSIS — F419 Anxiety disorder, unspecified: Secondary | ICD-10-CM | POA: Diagnosis present

## 2022-08-30 DIAGNOSIS — J449 Chronic obstructive pulmonary disease, unspecified: Secondary | ICD-10-CM | POA: Diagnosis not present

## 2022-08-30 DIAGNOSIS — M810 Age-related osteoporosis without current pathological fracture: Secondary | ICD-10-CM | POA: Diagnosis present

## 2022-08-30 DIAGNOSIS — Z79899 Other long term (current) drug therapy: Secondary | ICD-10-CM

## 2022-08-30 DIAGNOSIS — B961 Klebsiella pneumoniae [K. pneumoniae] as the cause of diseases classified elsewhere: Secondary | ICD-10-CM | POA: Diagnosis present

## 2022-08-30 DIAGNOSIS — I10 Essential (primary) hypertension: Secondary | ICD-10-CM | POA: Diagnosis not present

## 2022-08-30 DIAGNOSIS — R778 Other specified abnormalities of plasma proteins: Secondary | ICD-10-CM | POA: Diagnosis not present

## 2022-08-30 DIAGNOSIS — E860 Dehydration: Secondary | ICD-10-CM | POA: Diagnosis not present

## 2022-08-30 DIAGNOSIS — E8721 Acute metabolic acidosis: Secondary | ICD-10-CM | POA: Diagnosis present

## 2022-08-30 DIAGNOSIS — Z8249 Family history of ischemic heart disease and other diseases of the circulatory system: Secondary | ICD-10-CM

## 2022-08-30 DIAGNOSIS — K219 Gastro-esophageal reflux disease without esophagitis: Secondary | ICD-10-CM | POA: Diagnosis present

## 2022-08-30 DIAGNOSIS — R410 Disorientation, unspecified: Principal | ICD-10-CM

## 2022-08-30 DIAGNOSIS — R7989 Other specified abnormal findings of blood chemistry: Secondary | ICD-10-CM | POA: Diagnosis present

## 2022-08-30 DIAGNOSIS — N3000 Acute cystitis without hematuria: Secondary | ICD-10-CM | POA: Diagnosis present

## 2022-08-30 DIAGNOSIS — Z881 Allergy status to other antibiotic agents status: Secondary | ICD-10-CM

## 2022-08-30 DIAGNOSIS — Z882 Allergy status to sulfonamides status: Secondary | ICD-10-CM | POA: Diagnosis not present

## 2022-08-30 DIAGNOSIS — D638 Anemia in other chronic diseases classified elsewhere: Secondary | ICD-10-CM | POA: Diagnosis present

## 2022-08-30 DIAGNOSIS — J4489 Other specified chronic obstructive pulmonary disease: Secondary | ICD-10-CM | POA: Diagnosis present

## 2022-08-30 DIAGNOSIS — Z823 Family history of stroke: Secondary | ICD-10-CM

## 2022-08-30 DIAGNOSIS — R4182 Altered mental status, unspecified: Secondary | ICD-10-CM | POA: Diagnosis not present

## 2022-08-30 DIAGNOSIS — Z91040 Latex allergy status: Secondary | ICD-10-CM

## 2022-08-30 HISTORY — DX: Hypercalcemia: E83.52

## 2022-08-30 HISTORY — DX: Other specified abnormal findings of blood chemistry: R79.89

## 2022-08-30 HISTORY — DX: Unspecified psychosis not due to a substance or known physiological condition: F29

## 2022-08-30 HISTORY — DX: Bipolar disorder, unspecified: F31.9

## 2022-08-30 HISTORY — DX: Chronic obstructive pulmonary disease, unspecified: J44.9

## 2022-08-30 LAB — CBC WITH DIFFERENTIAL/PLATELET
Abs Immature Granulocytes: 0.02 10*3/uL (ref 0.00–0.07)
Abs Immature Granulocytes: 0.02 10*3/uL (ref 0.00–0.07)
Basophils Absolute: 0.1 10*3/uL (ref 0.0–0.1)
Basophils Absolute: 0.1 10*3/uL (ref 0.0–0.1)
Basophils Relative: 1 %
Basophils Relative: 1 %
Eosinophils Absolute: 0.1 10*3/uL (ref 0.0–0.5)
Eosinophils Absolute: 0.4 10*3/uL (ref 0.0–0.5)
Eosinophils Relative: 2 %
Eosinophils Relative: 5 %
HCT: 37.6 % (ref 36.0–46.0)
HCT: 35.3 % — ABNORMAL LOW (ref 36.0–46.0)
Hemoglobin: 11.9 g/dL — ABNORMAL LOW (ref 12.0–15.0)
Hemoglobin: 11.4 g/dL — ABNORMAL LOW (ref 12.0–15.0)
Immature Granulocytes: 0 %
Immature Granulocytes: 0 %
Lymphocytes Relative: 17 %
Lymphocytes Relative: 22 %
Lymphs Abs: 1.1 10*3/uL (ref 0.7–4.0)
Lymphs Abs: 1.8 10*3/uL (ref 0.7–4.0)
MCH: 28.4 pg (ref 26.0–34.0)
MCH: 28.9 pg (ref 26.0–34.0)
MCHC: 31.6 g/dL (ref 30.0–36.0)
MCHC: 32.3 g/dL (ref 30.0–36.0)
MCV: 89.7 fL (ref 80.0–100.0)
MCV: 89.4 fL (ref 80.0–100.0)
Monocytes Absolute: 0.7 10*3/uL (ref 0.1–1.0)
Monocytes Absolute: 0.9 10*3/uL (ref 0.1–1.0)
Monocytes Relative: 10 %
Monocytes Relative: 11 %
Neutro Abs: 4.6 10*3/uL (ref 1.7–7.7)
Neutro Abs: 5 10*3/uL (ref 1.7–7.7)
Neutrophils Relative %: 70 %
Neutrophils Relative %: 61 %
Platelets: 196 10*3/uL (ref 150–400)
Platelets: 207 10*3/uL (ref 150–400)
RBC: 4.19 MIL/uL (ref 3.87–5.11)
RBC: 3.95 MIL/uL (ref 3.87–5.11)
RDW: 12.7 % (ref 11.5–15.5)
RDW: 12.7 % (ref 11.5–15.5)
WBC: 6.6 10*3/uL (ref 4.0–10.5)
WBC: 8.1 10*3/uL (ref 4.0–10.5)
nRBC: 0 % (ref 0.0–0.2)
nRBC: 0 % (ref 0.0–0.2)

## 2022-08-30 LAB — I-STAT VENOUS BLOOD GAS, ED
Acid-Base Excess: 0 mmol/L (ref 0.0–2.0)
Bicarbonate: 22.8 mmol/L (ref 20.0–28.0)
Calcium, Ion: 1.27 mmol/L (ref 1.15–1.40)
HCT: 41 % (ref 36.0–46.0)
Hemoglobin: 13.9 g/dL (ref 12.0–15.0)
O2 Saturation: 100 %
Potassium: 4 mmol/L (ref 3.5–5.1)
Sodium: 141 mmol/L (ref 135–145)
TCO2: 24 mmol/L (ref 22–32)
pCO2, Ven: 31.6 mmHg — ABNORMAL LOW (ref 44–60)
pH, Ven: 7.466 — ABNORMAL HIGH (ref 7.25–7.43)
pO2, Ven: 184 mmHg — ABNORMAL HIGH (ref 32–45)

## 2022-08-30 LAB — COMPREHENSIVE METABOLIC PANEL
ALT: 13 U/L (ref 0–44)
ALT: 13 U/L (ref 0–44)
AST: 25 U/L (ref 15–41)
AST: 30 U/L (ref 15–41)
Albumin: 3.6 g/dL (ref 3.5–5.0)
Albumin: 3.3 g/dL — ABNORMAL LOW (ref 3.5–5.0)
Alkaline Phosphatase: 63 U/L (ref 38–126)
Alkaline Phosphatase: 60 U/L (ref 38–126)
Anion gap: 13 (ref 5–15)
Anion gap: 9 (ref 5–15)
BUN: 26 mg/dL — ABNORMAL HIGH (ref 8–23)
BUN: 25 mg/dL — ABNORMAL HIGH (ref 8–23)
CO2: 19 mmol/L — ABNORMAL LOW (ref 22–32)
CO2: 21 mmol/L — ABNORMAL LOW (ref 22–32)
Calcium: 10.9 mg/dL — ABNORMAL HIGH (ref 8.9–10.3)
Calcium: 10 mg/dL (ref 8.9–10.3)
Chloride: 110 mmol/L (ref 98–111)
Chloride: 110 mmol/L (ref 98–111)
Creatinine, Ser: 2.09 mg/dL — ABNORMAL HIGH (ref 0.44–1.00)
Creatinine, Ser: 1.7 mg/dL — ABNORMAL HIGH (ref 0.44–1.00)
GFR, Estimated: 24 mL/min — ABNORMAL LOW (ref >60)
GFR, Estimated: 30 mL/min — ABNORMAL LOW (ref >60)
Glucose, Bld: 104 mg/dL — ABNORMAL HIGH (ref 70–99)
Glucose, Bld: 81 mg/dL (ref 70–99)
Potassium: 3.9 mmol/L (ref 3.5–5.1)
Potassium: 3.8 mmol/L (ref 3.5–5.1)
Sodium: 142 mmol/L (ref 135–145)
Sodium: 140 mmol/L (ref 135–145)
Total Bilirubin: 0.6 mg/dL (ref 0.3–1.2)
Total Bilirubin: 0.4 mg/dL (ref 0.3–1.2)
Total Protein: 7.3 g/dL (ref 6.5–8.1)
Total Protein: 7.2 g/dL (ref 6.5–8.1)

## 2022-08-30 LAB — URINALYSIS, ROUTINE W REFLEX MICROSCOPIC
Bilirubin Urine: NEGATIVE
Glucose, UA: NEGATIVE mg/dL
Ketones, ur: 20 mg/dL — AB
Nitrite: NEGATIVE
Protein, ur: 100 mg/dL — AB
Specific Gravity, Urine: 1.016 (ref 1.005–1.030)
pH: 5 (ref 5.0–8.0)

## 2022-08-30 LAB — RAPID HIV SCREEN (HIV 1/2 AB+AG)
HIV 1/2 Antibodies: NONREACTIVE
HIV-1 P24 Antigen - HIV24: NONREACTIVE

## 2022-08-30 LAB — RAPID URINE DRUG SCREEN, HOSP PERFORMED
Amphetamines: NOT DETECTED
Barbiturates: NOT DETECTED
Benzodiazepines: POSITIVE — AB
Cocaine: NOT DETECTED
Opiates: NOT DETECTED
Tetrahydrocannabinol: NOT DETECTED

## 2022-08-30 LAB — TROPONIN I (HIGH SENSITIVITY)
Troponin I (High Sensitivity): 33 ng/L — ABNORMAL HIGH (ref <18)
Troponin I (High Sensitivity): 43 ng/L — ABNORMAL HIGH (ref <18)
Troponin I (High Sensitivity): 48 ng/L — ABNORMAL HIGH (ref <18)

## 2022-08-30 LAB — ECHOCARDIOGRAM COMPLETE
Area-P 1/2: 5.2 cm2
Height: 63 in
S' Lateral: 3.2 cm
Weight: 2240 oz

## 2022-08-30 LAB — MAGNESIUM: Magnesium: 1.7 mg/dL (ref 1.7–2.4)

## 2022-08-30 LAB — HEPATITIS PANEL, ACUTE
HCV Ab: NONREACTIVE
Hep A IgM: NONREACTIVE
Hep B C IgM: NONREACTIVE
Hepatitis B Surface Ag: NONREACTIVE

## 2022-08-30 LAB — RESP PANEL BY RT-PCR (FLU A&B, COVID) ARPGX2
Influenza A by PCR: NEGATIVE
Influenza B by PCR: NEGATIVE
SARS Coronavirus 2 by RT PCR: NEGATIVE

## 2022-08-30 LAB — CK: Total CK: 611 U/L — ABNORMAL HIGH (ref 38–234)

## 2022-08-30 LAB — PHOSPHORUS: Phosphorus: 3 mg/dL (ref 2.5–4.6)

## 2022-08-30 LAB — ETHANOL: Alcohol, Ethyl (B): <10 mg/dL (ref <10)

## 2022-08-30 MED ORDER — ACETAMINOPHEN 325 MG PO TABS: 650.0000 mg | ORAL_TABLET | Freq: Four times a day (QID) | ORAL | Status: DC | PRN

## 2022-08-30 MED ORDER — SODIUM CHLORIDE 0.9 % IV BOLUS
1000.0000 mL | Freq: Once | INTRAVENOUS | Status: AC
  Administered 2022-08-30: 1000 mL via INTRAVENOUS

## 2022-08-30 MED ORDER — LACTATED RINGERS IV SOLN: INTRAVENOUS | Status: DC

## 2022-08-30 MED ORDER — HYDRALAZINE HCL 20 MG/ML IJ SOLN
5.0000 mg | Freq: Four times a day (QID) | INTRAMUSCULAR | Status: DC | PRN
  Administered 2022-08-30: 5 mg via INTRAVENOUS
  Filled 2022-08-30: qty 1

## 2022-08-30 MED ORDER — AMLODIPINE BESYLATE 5 MG PO TABS
5.0000 mg | ORAL_TABLET | Freq: Every day | ORAL | Status: DC
  Administered 2022-08-30 – 2022-08-31 (×2): 5 mg via ORAL
  Filled 2022-08-30 (×2): qty 1

## 2022-08-30 MED ORDER — CIPROFLOXACIN IN D5W 400 MG/200ML IV SOLN
400.0000 mg | Freq: Every day | INTRAVENOUS | Status: DC
  Administered 2022-08-31 – 2022-09-01 (×2): 400 mg via INTRAVENOUS
  Filled 2022-08-30 (×4): qty 200

## 2022-08-30 MED ORDER — HALOPERIDOL LACTATE 5 MG/ML IJ SOLN
2.0000 mg | Freq: Four times a day (QID) | INTRAMUSCULAR | Status: DC | PRN
  Administered 2022-09-01: 2 mg via INTRAVENOUS
  Filled 2022-08-30: qty 1

## 2022-08-30 MED ORDER — PANTOPRAZOLE SODIUM 40 MG PO TBEC
40.0000 mg | DELAYED_RELEASE_TABLET | Freq: Every day | ORAL | Status: DC
  Administered 2022-08-30 – 2022-09-04 (×3): 40 mg via ORAL
  Filled 2022-08-30 (×6): qty 1

## 2022-08-30 MED ORDER — LEVALBUTEROL HCL 0.63 MG/3ML IN NEBU: 0.6300 mg | INHALATION_SOLUTION | RESPIRATORY_TRACT | Status: DC | PRN

## 2022-08-30 MED ORDER — MONTELUKAST SODIUM 10 MG PO TABS
10.0000 mg | ORAL_TABLET | Freq: Every day | ORAL | Status: DC
  Administered 2022-08-30 – 2022-09-03 (×5): 10 mg via ORAL
  Filled 2022-08-30 (×5): qty 1

## 2022-08-30 MED ORDER — HYDRALAZINE HCL 20 MG/ML IJ SOLN
10.0000 mg | Freq: Four times a day (QID) | INTRAMUSCULAR | Status: DC | PRN
  Administered 2022-08-31 (×2): 10 mg via INTRAVENOUS
  Filled 2022-08-30 (×3): qty 1

## 2022-08-30 MED ORDER — CIPROFLOXACIN IN D5W 400 MG/200ML IV SOLN
400.0000 mg | Freq: Once | INTRAVENOUS | Status: AC
  Administered 2022-08-30: 400 mg via INTRAVENOUS
  Filled 2022-08-30: qty 200

## 2022-08-30 MED ORDER — HYDRALAZINE HCL 20 MG/ML IJ SOLN
5.0000 mg | Freq: Once | INTRAMUSCULAR | Status: AC
  Administered 2022-08-30: 5 mg via INTRAVENOUS
  Filled 2022-08-30: qty 1

## 2022-08-30 MED ORDER — MOMETASONE FURO-FORMOTEROL FUM 200-5 MCG/ACT IN AERO
2.0000 | INHALATION_SPRAY | Freq: Two times a day (BID) | RESPIRATORY_TRACT | Status: DC
  Administered 2022-08-30 – 2022-09-04 (×10): 2 via RESPIRATORY_TRACT
  Filled 2022-08-30: qty 8.8

## 2022-08-30 MED ORDER — FOLIC ACID 1 MG PO TABS
1.0000 mg | ORAL_TABLET | Freq: Every day | ORAL | Status: DC
  Administered 2022-08-30 – 2022-09-04 (×6): 1 mg via ORAL
  Filled 2022-08-30 (×6): qty 1

## 2022-08-30 MED ORDER — ACETAMINOPHEN 650 MG RE SUPP: 650.0000 mg | Freq: Four times a day (QID) | RECTAL | Status: DC | PRN

## 2022-08-30 NOTE — ED Notes (Signed)
Pt assisted to shower with daughter. Supplies and new scrubs provided.
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
## 2022-08-30 NOTE — ED Notes (Signed)
Dr. Cardama at bedside at this time.  

## 2022-08-30 NOTE — ED Notes (Signed)
Verified lunch order and ensured they are aware that she does not eat port or red meat.  They said she will get a grilled chicken meal
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
## 2022-08-30 NOTE — Progress Notes (Signed)
Patient admitted early this morning for psychosis and was found to have acute kidney injury/UTI/hypercalcemia and mildly elevated troponin.  She is currently getting IV fluids and antibiotics.  Psychiatry has been consulted.  I have reviewed patient's medical records including this morning's H&P, current vitals, labs myself.

## 2022-08-30 NOTE — ED Notes (Signed)
MD Rathore made aware of BP.

## 2022-08-30 NOTE — ED Notes (Signed)
Transported to vascular lab

## 2022-08-30 NOTE — ED Notes (Signed)
IVC PAPERS ARE COMPLETE AND PLACED ON PURPLE CLIPBOARD
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
## 2022-08-30 NOTE — Progress Notes (Signed)
[facility] at bedside
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
## 2022-08-30 NOTE — Progress Notes (Signed)
Daughter at bedside.

## 2022-08-31 DIAGNOSIS — K219 Gastro-esophageal reflux disease without esophagitis: Secondary | ICD-10-CM | POA: Diagnosis present

## 2022-08-31 DIAGNOSIS — E860 Dehydration: Secondary | ICD-10-CM | POA: Diagnosis present

## 2022-08-31 DIAGNOSIS — N3000 Acute cystitis without hematuria: Secondary | ICD-10-CM | POA: Diagnosis not present

## 2022-08-31 DIAGNOSIS — Z880 Allergy status to penicillin: Secondary | ICD-10-CM | POA: Diagnosis not present

## 2022-08-31 DIAGNOSIS — Z87891 Personal history of nicotine dependence: Secondary | ICD-10-CM | POA: Diagnosis not present

## 2022-08-31 DIAGNOSIS — Z9151 Personal history of suicidal behavior: Secondary | ICD-10-CM | POA: Diagnosis not present

## 2022-08-31 DIAGNOSIS — E8721 Acute metabolic acidosis: Secondary | ICD-10-CM | POA: Diagnosis present

## 2022-08-31 DIAGNOSIS — M810 Age-related osteoporosis without current pathological fracture: Secondary | ICD-10-CM | POA: Diagnosis present

## 2022-08-31 DIAGNOSIS — Z882 Allergy status to sulfonamides status: Secondary | ICD-10-CM | POA: Diagnosis not present

## 2022-08-31 DIAGNOSIS — F23 Brief psychotic disorder: Secondary | ICD-10-CM | POA: Diagnosis present

## 2022-08-31 DIAGNOSIS — N390 Urinary tract infection, site not specified: Secondary | ICD-10-CM | POA: Diagnosis present

## 2022-08-31 DIAGNOSIS — Z79899 Other long term (current) drug therapy: Secondary | ICD-10-CM | POA: Diagnosis not present

## 2022-08-31 DIAGNOSIS — E785 Hyperlipidemia, unspecified: Secondary | ICD-10-CM | POA: Diagnosis present

## 2022-08-31 DIAGNOSIS — Z7951 Long term (current) use of inhaled steroids: Secondary | ICD-10-CM | POA: Diagnosis not present

## 2022-08-31 DIAGNOSIS — F29 Unspecified psychosis not due to a substance or known physiological condition: Secondary | ICD-10-CM | POA: Diagnosis not present

## 2022-08-31 DIAGNOSIS — F05 Delirium due to known physiological condition: Secondary | ICD-10-CM | POA: Diagnosis present

## 2022-08-31 DIAGNOSIS — R7989 Other specified abnormal findings of blood chemistry: Secondary | ICD-10-CM | POA: Diagnosis not present

## 2022-08-31 DIAGNOSIS — Z1152 Encounter for screening for COVID-19: Secondary | ICD-10-CM | POA: Diagnosis not present

## 2022-08-31 DIAGNOSIS — F419 Anxiety disorder, unspecified: Secondary | ICD-10-CM | POA: Diagnosis present

## 2022-08-31 DIAGNOSIS — D638 Anemia in other chronic diseases classified elsewhere: Secondary | ICD-10-CM | POA: Diagnosis present

## 2022-08-31 DIAGNOSIS — G4700 Insomnia, unspecified: Secondary | ICD-10-CM | POA: Diagnosis present

## 2022-08-31 DIAGNOSIS — Z887 Allergy status to serum and vaccine status: Secondary | ICD-10-CM | POA: Diagnosis not present

## 2022-08-31 DIAGNOSIS — N179 Acute kidney failure, unspecified: Secondary | ICD-10-CM | POA: Diagnosis present

## 2022-08-31 DIAGNOSIS — I10 Essential (primary) hypertension: Secondary | ICD-10-CM | POA: Diagnosis present

## 2022-08-31 DIAGNOSIS — R4182 Altered mental status, unspecified: Secondary | ICD-10-CM | POA: Diagnosis present

## 2022-08-31 DIAGNOSIS — J4489 Other specified chronic obstructive pulmonary disease: Secondary | ICD-10-CM | POA: Diagnosis present

## 2022-08-31 LAB — CBC WITH DIFFERENTIAL/PLATELET
Abs Immature Granulocytes: 0.03 10*3/uL (ref 0.00–0.07)
Basophils Absolute: 0.1 10*3/uL (ref 0.0–0.1)
Basophils Relative: 1 %
Eosinophils Absolute: 0.7 10*3/uL — ABNORMAL HIGH (ref 0.0–0.5)
Eosinophils Relative: 9 %
HCT: 35.8 % — ABNORMAL LOW (ref 36.0–46.0)
Hemoglobin: 11.6 g/dL — ABNORMAL LOW (ref 12.0–15.0)
Immature Granulocytes: 0 %
Lymphocytes Relative: 22 %
Lymphs Abs: 1.7 10*3/uL (ref 0.7–4.0)
MCH: 28.2 pg (ref 26.0–34.0)
MCHC: 32.4 g/dL (ref 30.0–36.0)
MCV: 87.1 fL (ref 80.0–100.0)
Monocytes Absolute: 0.8 10*3/uL (ref 0.1–1.0)
Monocytes Relative: 10 %
Neutro Abs: 4.5 10*3/uL (ref 1.7–7.7)
Neutrophils Relative %: 58 %
Platelets: 213 10*3/uL (ref 150–400)
RBC: 4.11 MIL/uL (ref 3.87–5.11)
RDW: 12.8 % (ref 11.5–15.5)
WBC: 7.8 10*3/uL (ref 4.0–10.5)
nRBC: 0 % (ref 0.0–0.2)

## 2022-08-31 LAB — COMPREHENSIVE METABOLIC PANEL
ALT: 13 U/L (ref 0–44)
AST: 31 U/L (ref 15–41)
Albumin: 3.1 g/dL — ABNORMAL LOW (ref 3.5–5.0)
Alkaline Phosphatase: 61 U/L (ref 38–126)
Anion gap: 7 (ref 5–15)
BUN: 18 mg/dL (ref 8–23)
CO2: 21 mmol/L — ABNORMAL LOW (ref 22–32)
Calcium: 10 mg/dL (ref 8.9–10.3)
Chloride: 111 mmol/L (ref 98–111)
Creatinine, Ser: 1.38 mg/dL — ABNORMAL HIGH (ref 0.44–1.00)
GFR, Estimated: 39 mL/min — ABNORMAL LOW (ref >60)
Glucose, Bld: 101 mg/dL — ABNORMAL HIGH (ref 70–99)
Potassium: 3.5 mmol/L (ref 3.5–5.1)
Sodium: 139 mmol/L (ref 135–145)
Total Bilirubin: 0.5 mg/dL (ref 0.3–1.2)
Total Protein: 6.7 g/dL (ref 6.5–8.1)

## 2022-08-31 LAB — TSH: TSH: 0.63 u[IU]/mL (ref 0.350–4.500)

## 2022-08-31 LAB — MAGNESIUM: Magnesium: 1.6 mg/dL — ABNORMAL LOW (ref 1.7–2.4)

## 2022-08-31 LAB — VITAMIN B12: Vitamin B-12: 482 pg/mL (ref 180–914)

## 2022-08-31 MED ORDER — OLOPATADINE HCL 0.1 % OP SOLN
1.0000 [drp] | Freq: Two times a day (BID) | OPHTHALMIC | Status: DC
  Administered 2022-08-31 – 2022-09-04 (×8): 1 [drp] via OPHTHALMIC
  Filled 2022-08-31: qty 5

## 2022-08-31 MED ORDER — AMLODIPINE BESYLATE 10 MG PO TABS
10.0000 mg | ORAL_TABLET | Freq: Every day | ORAL | Status: DC
  Administered 2022-09-01 – 2022-09-04 (×4): 10 mg via ORAL
  Filled 2022-08-31 (×4): qty 1

## 2022-08-31 MED ORDER — MAGNESIUM SULFATE 2 GM/50ML IV SOLN
2.0000 g | Freq: Once | INTRAVENOUS | Status: AC
  Administered 2022-08-31: 2 g via INTRAVENOUS
  Filled 2022-08-31: qty 50

## 2022-08-31 MED ORDER — HYDRALAZINE HCL 20 MG/ML IJ SOLN
10.0000 mg | INTRAMUSCULAR | Status: DC | PRN
  Administered 2022-08-31 – 2022-09-01 (×2): 10 mg via INTRAVENOUS
  Filled 2022-08-31: qty 1

## 2022-08-31 MED ORDER — RISPERIDONE 0.25 MG PO TABS
0.2500 mg | ORAL_TABLET | Freq: Two times a day (BID) | ORAL | Status: DC
  Administered 2022-08-31 – 2022-09-04 (×8): 0.25 mg via ORAL
  Filled 2022-08-31 (×10): qty 1

## 2022-08-31 NOTE — Progress Notes (Signed)
PROGRESS NOTE    [name] [name]  DGU:[id number] DOB: [birth year] DOA: 08/30/2022 PCP: Hoyt Koch, MD   Brief Narrative:  81 year old female with history of COPD, essential hypertension, bipolar disorder was brought to the hospital IVC'd because of acute psychosis/paranoia.  On presentation, creatinine was 2.06 compared to 1.14 in January 2023, calcium of 11.3, initial high-sensitivity troponin of 33, UA suggestive of UTI, COVID-19/influenza PCR negative, chest x-ray negative for any acute cardiopulmonary process.  EKG showed no signs of ischemia.  She was started on IV fluids and antibiotics.  Psychiatry consult requested which is pending.  Assessment & Plan:   Acute kidney injury Dehydration Acute metabolic acidosis -Presented with creatinine of 2.09, compared to 1.14 in January 2023.  Currently getting IV fluids.  Creatinine improving to 1.38 this morning.  Decrease IV fluids to 75 cc an hour  Hypercalcemia -Possibly from dehydration and acute kidney injury.  Resolved with IV fluids.  UTI/acute cystitis: Present on admission -Continue ciprofloxacin.  Urine culture growing gram-negative rods.  Follow identification and sensitivity.  Elevated troponin -Possibly from demand ischemia.  Initial troponin was 33 and troponin only went up to 48.  No chest pain or ischemic changes on EKG.  Echo showed EF of 55 to 60% with no regional wall motion abnormalities.  No further work-up needed  Acute psychosis History of bipolar disorder -brought to the hospital IVC'd because of acute psychosis/paranoia -Psychiatry consultation has been pending.  Patient is not on any medications at home for her bipolar disorder. -Continue bedside sitter. -B12 is 482.  Continue folic acid supplementation  Hypomagnesemia -Replace.  Repeat a.m. labs  Normocytic anemia -Possibly from anemia of chronic disease.  Hemoglobin stable.  No signs of bleeding.  Monitor intermittently.  Hypertension -Blood  pressure on the higher side.  Amlodipine to 10 mg daily.  COPD -Stable.  Continue Dulera and montelukast.  Use inhaler/nebs as needed  Physical deconditioning -PT eval  DVT prophylaxis: SCDs Code Status: Full Family Communication: None at bedside Disposition Plan: Status is: Observation The patient will require care spanning > 2 midnights and should be moved to inpatient because: Of severity of illness.    Consultants: Psychiatry  Procedures: None  Antimicrobials: Ciprofloxacin from 08/29/2022 onwards   Subjective: Patient seen and examined at bedside.  More awake this morning and answers some questions but extremely slow to respond.  Does not participate in conversation much.  No fever, vomiting, seizures or agitation reported.  Objective: Vitals:   08/31/22 0330 08/31/22 0402 08/31/22 0601 08/31/22 0800  BP: (!) 154/77 (!) 172/81 (!) 167/79 (!) 175/87  Pulse: 78 98 90 79  Resp: 18 (!) 24 (!) 27 19  Temp:  98.4 F (36.9 C)  98.5 F (36.9 C)  TempSrc:  Oral  Oral  SpO2: 96% 98% 95% 98%  Weight:      Height:        Intake/Output Summary (Last 24 hours) at 08/31/2022 1009 Last data filed at 08/31/2022 0300 Gross per 24 hour  Intake --  Output 200 ml  Net -200 ml   Filed Weights   08/30/22 0130  Weight: 63.5 kg    Examination:  General exam: Appears calm and comfortable.  Looks chronically ill and deconditioned.  Currently on room air. Respiratory system: Bilateral decreased breath sounds at bases with scattered crackles and intermittent tachypnea Cardiovascular system: S1 & S2 heard, Rate controlled Gastrointestinal system: Abdomen is nondistended, soft and nontender. Normal bowel sounds heard. Extremities: No cyanosis, clubbing, edema  Central nervous system: Awake, very slow to respond, answers few questions appropriately.  No focal neurological deficits. Moving extremities Skin: No rashes, lesions or ulcers Psychiatry: Extremely flat affect.  Does not  participate in conversation much.   Data Reviewed: I have personally reviewed following labs and imaging studies  CBC: Recent Labs  Lab 08/30/22 0120 08/30/22 0241 08/30/22 0805 08/31/22 0428  WBC 6.6  --  8.1 7.8  NEUTROABS 4.6  --  5.0 4.5  HGB 11.9* 13.9 11.4* 11.6*  HCT 37.6 41.0 35.3* 35.8*  MCV 89.7  --  89.4 87.1  PLT 196  --  207 810   Basic Metabolic Panel: Recent Labs  Lab 08/30/22 0120 08/30/22 0241 08/30/22 0805 08/31/22 0428  NA 142 141 140 139  K 3.9 4.0 3.8 3.5  CL 110  --  110 111  CO2 19*  --  21* 21*  GLUCOSE 104*  --  81 101*  BUN 26*  --  25* 18  CREATININE 2.09*  --  1.70* 1.38*  CALCIUM 10.9*  --  10.0 10.0  MG  --   --  1.7 1.6*  PHOS  --   --  3.0  --    GFR: Estimated Creatinine Clearance: 29.6 mL/min (A) (by C-G formula based on SCr of 1.38 mg/dL (H)). Liver Function Tests: Recent Labs  Lab 08/30/22 0120 08/30/22 0805 08/31/22 0428  AST '25 30 31  '$ ALT '13 13 13  '$ ALKPHOS 63 60 61  BILITOT 0.6 0.4 0.5  PROT 7.3 7.2 6.7  ALBUMIN 3.6 3.3* 3.1*   No results for input(s): "LIPASE", "AMYLASE" in the last 168 hours. No results for input(s): "AMMONIA" in the last 168 hours. Coagulation Profile: No results for input(s): "INR", "PROTIME" in the last 168 hours. Cardiac Enzymes: Recent Labs  Lab 08/30/22 0805  CKTOTAL 611*   BNP (last 3 results) No results for input(s): "PROBNP" in the last 8760 hours. HbA1C: No results for input(s): "HGBA1C" in the last 72 hours. CBG: No results for input(s): "GLUCAP" in the last 168 hours. Lipid Profile: No results for input(s): "CHOL", "HDL", "LDLCALC", "TRIG", "CHOLHDL", "LDLDIRECT" in the last 72 hours. Thyroid Function Tests: Recent Labs    08/31/22 0428  TSH 0.630   Anemia Panel: Recent Labs    08/31/22 0447  VITAMINB12 482   Sepsis Labs: No results for input(s): "PROCALCITON", "LATICACIDVEN" in the last 168 hours.  Recent Results (from the past 240 hour(s))  Urine Culture      Status: Abnormal (Preliminary result)   Collection Time: 08/30/22 12:48 AM   Specimen: Urine, Clean Catch  Result Value Ref Range Status   Specimen Description URINE, CLEAN CATCH  Final   Special Requests NONE  Final   Culture (A)  Final    >=100,000 COLONIES/mL GRAM NEGATIVE RODS SUSCEPTIBILITIES TO FOLLOW Performed at [facility], [address], [place], [place] [address]    Report Status PENDING  Incomplete  Resp Panel by RT-PCR (Flu A&B, Covid) Anterior Nasal Swab     Status: None   Collection Time: 08/30/22  1:12 AM   Specimen: Anterior Nasal Swab  Result Value Ref Range Status   SARS Coronavirus 2 by RT PCR NEGATIVE NEGATIVE Final    Comment: (NOTE) SARS-CoV-2 target nucleic acids are NOT DETECTED.  The SARS-CoV-2 RNA is generally detectable in upper respiratory specimens during the acute phase of infection. The lowest concentration of SARS-CoV-2 viral copies this assay can detect is 138 copies/mL. A negative result  does not preclude SARS-Cov-2 infection and should not be used as the sole basis for treatment or other patient management decisions. A negative result may occur with  improper specimen collection/handling, submission of specimen other than nasopharyngeal swab, presence of viral mutation(s) within the areas targeted by this assay, and inadequate number of viral copies(<138 copies/mL). A negative result must be combined with clinical observations, patient history, and epidemiological information. The expected result is Negative.  Fact Sheet for Patients:  EntrepreneurPulse.com.au  Fact Sheet for Healthcare Providers:  IncredibleEmployment.be  This test is no t yet approved or cleared by the [place] FDA and  has been authorized for detection and/or diagnosis of SARS-CoV-2 by FDA under an Emergency Use Authorization (EUA). This EUA will remain  in effect (meaning this test can be used) for the duration of  the COVID-19 declaration under Section 564(b)(1) of the Act, 21 U.S.C.section 360bbb-3(b)(1), unless the authorization is terminated  or revoked sooner.       Influenza A by PCR NEGATIVE NEGATIVE Final   Influenza B by PCR NEGATIVE NEGATIVE Final    Comment: (NOTE) The Xpert Xpress SARS-CoV-2/FLU/RSV plus assay is intended as an aid in the diagnosis of influenza from Nasopharyngeal swab specimens and should not be used as a sole basis for treatment. Nasal washings and aspirates are unacceptable for Xpert Xpress SARS-CoV-2/FLU/RSV testing.  Fact Sheet for Patients: EntrepreneurPulse.com.au  Fact Sheet for Healthcare Providers: IncredibleEmployment.be  This test is not yet approved or cleared by the [place] FDA and has been authorized for detection and/or diagnosis of SARS-CoV-2 by FDA under an Emergency Use Authorization (EUA). This EUA will remain in effect (meaning this test can be used) for the duration of the COVID-19 declaration under Section 564(b)(1) of the Act, 21 U.S.C. section 360bbb-3(b)(1), unless the authorization is terminated or revoked.  Performed at [facility], [place] [address], [place],  [address]          Radiology Studies: ECHOCARDIOGRAM COMPLETE  Result Date: 08/30/2022    ECHOCARDIOGRAM REPORT   Patient Name:   [name] [name] Date of Exam: 08/30/2022 Medical Rec #:  [id number]    Height:       63.0 in Accession #:    [id number]   Weight:       140.0 lb Date of Birth:  1940/11/21     BSA:          1.662 m Patient Age:    40 years     BP:           216/89 mmHg Patient Gender: F            HR:           75 bpm. Exam Location:  Inpatient Procedure: 2D Echo, Color Doppler and Cardiac Doppler Indications:    elevated troponins  History:        Patient has no prior history of Echocardiogram examinations.                 COPD; Risk Factors:Dyslipidemia and Hypertension.  Sonographer:    Melissa Morford RDCS (AE,  PE) Referring Phys: [id number] [place]  1. Left ventricular ejection fraction, by estimation, is 55 to 60%. The left ventricle has normal function. The left ventricle has no regional wall motion abnormalities. Left ventricular diastolic parameters are indeterminate.  2. Right ventricular systolic function is normal. The right ventricular size is normal.  3. The mitral valve is normal in structure. Trivial mitral  valve regurgitation.  4. The aortic valve is tricuspid. Aortic valve regurgitation is not visualized. Aortic valve sclerosis/calcification is present, without any evidence of aortic stenosis.  5. The inferior vena cava is normal in size with greater than 50% respiratory variability, suggesting right atrial pressure of 3 mmHg. FINDINGS  Left Ventricle: Left ventricular ejection fraction, by estimation, is 55 to 60%. The left ventricle has normal function. The left ventricle has no regional wall motion abnormalities. The left ventricular internal cavity size was normal in size. There is  no left ventricular hypertrophy. Left ventricular diastolic parameters are indeterminate. Right Ventricle: The right ventricular size is normal. Right vetricular wall thickness was not assessed. Right ventricular systolic function is normal. Left Atrium: Left atrial size was normal in size. Right Atrium: Right atrial size was normal in size. Pericardium: There is no evidence of pericardial effusion. Mitral Valve: The mitral valve is normal in structure. Trivial mitral valve regurgitation. Tricuspid Valve: The tricuspid valve is normal in structure. Tricuspid valve regurgitation is trivial. Aortic Valve: The aortic valve is tricuspid. Aortic valve regurgitation is not visualized. Aortic valve sclerosis/calcification is present, without any evidence of aortic stenosis. Pulmonic Valve: The pulmonic valve was not well visualized. Pulmonic valve regurgitation is not visualized. No evidence of pulmonic stenosis.  Aorta: The aortic root is normal in size and structure. Venous: The inferior vena cava is normal in size with greater than 50% respiratory variability, suggesting right atrial pressure of 3 mmHg. IAS/Shunts: No atrial level shunt detected by color flow Doppler.  LEFT VENTRICLE PLAX 2D LVIDd:         4.40 cm   Diastology LVIDs:         3.20 cm   LV e' medial:    4.13 cm/s LV PW:         1.00 cm   LV E/e' medial:  22.0 LV IVS:        0.90 cm   LV e' lateral:   6.64 cm/s LVOT diam:     2.00 cm   LV E/e' lateral: 13.7 LV SV:         69 LV SV Index:   42 LVOT Area:     3.14 cm  RIGHT VENTRICLE RV S prime:     17.30 cm/s TAPSE (M-mode): 2.3 cm LEFT ATRIUM             Index        RIGHT ATRIUM           Index LA diam:        4.00 cm 2.41 cm/m   RA Area:     16.10 cm LA Vol (A2C):   66.9 ml 40.26 ml/m  RA Volume:   40.40 ml  24.31 ml/m LA Vol (A4C):   53.8 ml 32.38 ml/m LA Biplane Vol: 63.3 ml 38.09 ml/m  AORTIC VALVE LVOT Vmax:   109.00 cm/s LVOT Vmean:  69.800 cm/s LVOT VTI:    0.221 m  AORTA Ao Root diam: 2.70 cm MITRAL VALVE                TRICUSPID VALVE MV Area (PHT): 5.20 cm     TR Peak grad:   25.8 mmHg MV Decel Time: 146 msec     TR Vmax:        254.00 cm/s MV E velocity: 90.70 cm/s MV A velocity: 141.00 cm/s  SHUNTS MV E/A ratio:  0.64         Systemic VTI:  0.22 m                             Systemic Diam: 2.00 cm Dorris Carnes MD Electronically signed by Dorris Carnes MD Signature Date/Time: 08/30/2022/2:52:36 PM    Final    DG Chest 2 View  Result Date: 08/30/2022 CLINICAL DATA:  Altered mental status EXAM: CHEST - 2 VIEW COMPARISON:  08/24/2015 FINDINGS: Heart is borderline in size. No confluent airspace opacities, effusions or edema. No acute bony abnormality. IMPRESSION: No active cardiopulmonary disease. Electronically Signed   By: Rolm Baptise M.D.   On: 08/30/2022 01:46        Scheduled Meds:  amLODipine  5 mg Oral Daily   folic acid  1 mg Oral Daily   mometasone-formoterol  2 puff  Inhalation BID   montelukast  10 mg Oral QHS   pantoprazole  40 mg Oral Daily   Continuous Infusions:  ciprofloxacin     lactated ringers 100 mL/hr at 08/31/22 0554   magnesium sulfate bolus IVPB 2 g (08/31/22 0945)          Aline August, MD [facility] 08/31/2022, 10:09 AM

## 2022-08-31 NOTE — Consult Note (Signed)
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  Speech: Clear and Coherent  Speech Volume: Normal  Handedness:No data recorded  Mood and Affect  Mood: Dysphoric; Anxious  Affect: Congruent; Full Range   Thought Process  Thought Processes: Linear  Descriptions of Associations:Intact  Orientation:Full (Time, Place and Person)  Thought  Content:Paranoid Ideation  History of Schizophrenia/Schizoaffective disorder:No  Duration of Psychotic Symptoms:Less than six months  Hallucinations:Hallucinations: Auditory; Visual  Ideas of Reference:Paranoia; Delusions  Suicidal Thoughts:Suicidal Thoughts: No  Homicidal Thoughts:Homicidal Thoughts: No   Sensorium  Memory: Immediate Good; Recent Good; Remote Fair  Judgment: Fair  Insight: Fair   Materials engineer: Fair  Attention Span: Amberg recorded Ewing recorded  Psychomotor Activity  Psychomotor Activity: Psychomotor Activity: Normal   Assets  Assets:No data recorded  Sleep  Sleep: Sleep: Fair   Physical Exam: Physical Exam Vitals reviewed.    Review of Systems  Psychiatric/Behavioral:  Positive for depression and hallucinations. Negative for suicidal ideas. The patient is nervous/anxious and has insomnia.    Blood pressure (!) 175/87, pulse 79, temperature 98.5 F (36.9 C), temperature source Oral, resp. rate 19, height '5\' 3"'$  (1.6 m), weight 63.5 kg, SpO2 98 %. Body mass index is 24.8 kg/m.  Treatment Plan Summary:  Assessment: -delirium - active symptoms 2/2 delirium and not decompensation of bipolar disorder -bipolar disorder - untreated, currently depressed episode   Plan: -continue 1:1 sitter for now due to paranoia and confusion. Can likely be dc within 24 hrs. -IVC can be dc/rescinded - pt does not require inpt psych admission. Symptoms are due to delirium and not bipolar disorder -start risperdal 0.25 mg q12H for psychosis 2/2 delirium and also as a mood stabilizer for pts untreated bipolar d/o    Disposition: Patient does not meet criteria for psychiatric inpatient admission.  Christoper Allegra, MD 08/31/2022 12:24 PM  Total Time Spent in Direct Patient Care:  I personally spent 60 minutes on the unit in direct patient care. The direct patient  care time included face-to-face time with the patient, reviewing the patient's chart, communicating with other professionals, and coordinating care. Greater than 50% of this time  was spent in counseling or coordinating care with the patient regarding goals of hospitalization, psycho-education, and discharge planning needs.   Janine Limbo, MD Psychiatrist

## 2022-09-01 DIAGNOSIS — N179 Acute kidney failure, unspecified: Secondary | ICD-10-CM | POA: Diagnosis not present

## 2022-09-01 DIAGNOSIS — R7989 Other specified abnormal findings of blood chemistry: Secondary | ICD-10-CM | POA: Diagnosis not present

## 2022-09-01 DIAGNOSIS — E860 Dehydration: Secondary | ICD-10-CM | POA: Diagnosis not present

## 2022-09-01 DIAGNOSIS — N3000 Acute cystitis without hematuria: Secondary | ICD-10-CM | POA: Diagnosis not present

## 2022-09-01 LAB — BASIC METABOLIC PANEL
Anion gap: 6 (ref 5–15)
BUN: 17 mg/dL (ref 8–23)
CO2: 23 mmol/L (ref 22–32)
Calcium: 10.4 mg/dL — ABNORMAL HIGH (ref 8.9–10.3)
Chloride: 111 mmol/L (ref 98–111)
Creatinine, Ser: 1.44 mg/dL — ABNORMAL HIGH (ref 0.44–1.00)
GFR, Estimated: 37 mL/min — ABNORMAL LOW (ref >60)
Glucose, Bld: 96 mg/dL (ref 70–99)
Potassium: 3.6 mmol/L (ref 3.5–5.1)
Sodium: 140 mmol/L (ref 135–145)

## 2022-09-01 LAB — URINE CULTURE: Culture: >=100000 — AB

## 2022-09-01 LAB — MAGNESIUM: Magnesium: 2.1 mg/dL (ref 1.7–2.4)

## 2022-09-01 NOTE — Plan of Care (Signed)

## 2022-09-01 NOTE — Progress Notes (Signed)
PROGRESS NOTE    [name] [name]  BOF:[id number] DOB: [birth year] DOA: 08/30/2022 PCP: Hoyt Koch, MD   Brief Narrative:  80 year old female with history of COPD, essential hypertension, bipolar disorder was brought to the hospital IVC'd because of acute psychosis/paranoia.  On presentation, creatinine was 2.06 compared to 1.14 in January 2023, calcium of 11.3, initial high-sensitivity troponin of 33, UA suggestive of UTI, COVID-19/influenza PCR negative, chest x-ray negative for any acute cardiopulmonary process.  EKG showed no signs of ischemia.  She was started on IV fluids and antibiotics.  Psychiatry consulted.  Assessment & Plan:   Acute kidney injury Dehydration Acute metabolic acidosis -Presented with creatinine of 2.09, compared to 1.14 in January 2023.  Treated with IV fluids.  Creatinine 1.44 this morning.  DC IV fluids.  Repeat a.m. labs.  Hypercalcemia -Possibly from dehydration and acute kidney injury.  Resolved with IV fluids.  UTI/acute cystitis: Present on admission -Continue ciprofloxacin.  Urine culture growing Klebsiella pneumoniae.  Follow sensitivities.  Elevated troponin -Possibly from demand ischemia.  Initial troponin was 33 and troponin only went up to 48.  No chest pain or ischemic changes on EKG.  Echo showed EF of 55 to 60% with no regional wall motion abnormalities.  No further work-up needed  Acute psychosis History of bipolar disorder -brought to the hospital IVC'd because of acute psychosis/paranoia -Psychiatry evaluation appreciated: -Continue bedside sitter for now.  IVC has been rescinded by psychiatry.  She has been started on Risperdal.  Patient does not need inpatient psychiatric hospitalization -B12 is 482.  Continue folic acid supplementation  Hypomagnesemia -Improved.  Normocytic anemia -Possibly from anemia of chronic disease.  Hemoglobin stable.  No signs of bleeding.  Monitor intermittently.  Hypertension -Blood pressure  currently stable.  Continue increased dose of amlodipine.  COPD -Stable.  Continue Dulera and montelukast.  Use inhaler/nebs as needed  Physical deconditioning -PT eval  DVT prophylaxis: SCDs Code Status: Full Family Communication: None at bedside Disposition Plan: Status is:  inpatient because: Of severity of illness.   Consultants: Psychiatry  Procedures: None  Antimicrobials: Ciprofloxacin from 08/29/2022 onwards   Subjective: Patient seen and examined at bedside.  No agitation, fever, vomiting reported. Objective: Vitals:   09/01/22 0253 09/01/22 0300 09/01/22 0400 09/01/22 0500  BP: (!) 166/65 (!) 163/64 (!) 131/56 128/70  Pulse:  64 70 67  Resp:  '17 17 16  '$ Temp:   98.1 F (36.7 C)   TempSrc:   Oral   SpO2:  96% 98% 97%  Weight:      Height:        Intake/Output Summary (Last 24 hours) at 09/01/2022 0729 Last data filed at 09/01/2022 0600 Gross per 24 hour  Intake 200 ml  Output 950 ml  Net -750 ml    Filed Weights   08/30/22 0130  Weight: 63.5 kg    Examination:  General: On room air.  No distress.  Looks chronically ill and deconditioned. ENT/neck: No thyromegaly.  JVD is not elevated  respiratory: Decreased breath sounds at bases bilaterally with some crackles; no wheezing  CVS: S1-S2 heard, rate controlled currently Abdominal: Soft, nontender, slightly distended; no organomegaly, bowel sounds are heard Extremities: Trace lower extremity edema; no cyanosis  CNS: Awake, still slow to respond but answers some questions.  No focal neurologic deficit.  Moves extremities Lymph: No obvious lymphadenopathy Skin: No obvious ecchymosis/lesions  psych: Affect is mostly flat.  No signs of agitation.   Musculoskeletal: No obvious joint swelling/deformity  Data Reviewed: I have personally reviewed following labs and imaging studies  CBC: Recent Labs  Lab 08/30/22 0120 08/30/22 0241 08/30/22 0805 08/31/22 0428  WBC 6.6  --  8.1 7.8  NEUTROABS 4.6   --  5.0 4.5  HGB 11.9* 13.9 11.4* 11.6*  HCT 37.6 41.0 35.3* 35.8*  MCV 89.7  --  89.4 87.1  PLT 196  --  207 149    Basic Metabolic Panel: Recent Labs  Lab 08/30/22 0120 08/30/22 0241 08/30/22 0805 08/31/22 0428 09/01/22 0207  NA 142 141 140 139 140  K 3.9 4.0 3.8 3.5 3.6  CL 110  --  110 111 111  CO2 19*  --  21* 21* 23  GLUCOSE 104*  --  81 101* 96  BUN 26*  --  25* 18 17  CREATININE 2.09*  --  1.70* 1.38* 1.44*  CALCIUM 10.9*  --  10.0 10.0 10.4*  MG  --   --  1.7 1.6* 2.1  PHOS  --   --  3.0  --   --     GFR: Estimated Creatinine Clearance: 28.4 mL/min (A) (by C-G formula based on SCr of 1.44 mg/dL (H)). Liver Function Tests: Recent Labs  Lab 08/30/22 0120 08/30/22 0805 08/31/22 0428  AST '25 30 31  '$ ALT '13 13 13  '$ ALKPHOS 63 60 61  BILITOT 0.6 0.4 0.5  PROT 7.3 7.2 6.7  ALBUMIN 3.6 3.3* 3.1*    No results for input(s): "LIPASE", "AMYLASE" in the last 168 hours. No results for input(s): "AMMONIA" in the last 168 hours. Coagulation Profile: No results for input(s): "INR", "PROTIME" in the last 168 hours. Cardiac Enzymes: Recent Labs  Lab 08/30/22 0805  CKTOTAL 611*    BNP (last 3 results) No results for input(s): "PROBNP" in the last 8760 hours. HbA1C: No results for input(s): "HGBA1C" in the last 72 hours. CBG: No results for input(s): "GLUCAP" in the last 168 hours. Lipid Profile: No results for input(s): "CHOL", "HDL", "LDLCALC", "TRIG", "CHOLHDL", "LDLDIRECT" in the last 72 hours. Thyroid Function Tests: Recent Labs    08/31/22 0428  TSH 0.630    Anemia Panel: Recent Labs    08/31/22 0447  VITAMINB12 482    Sepsis Labs: No results for input(s): "PROCALCITON", "LATICACIDVEN" in the last 168 hours.  Recent Results (from the past 240 hour(s))  Urine Culture     Status: Abnormal (Preliminary result)   Collection Time: 08/30/22 12:48 AM   Specimen: Urine, Clean Catch  Result Value Ref Range Status   Specimen Description URINE, CLEAN  CATCH  Final   Special Requests NONE  Final   Culture (A)  Final    >=100,000 COLONIES/mL KLEBSIELLA PNEUMONIAE SUSCEPTIBILITIES TO FOLLOW Performed at [facility], [address], [place], [place] [address]    Report Status PENDING  Incomplete  Resp Panel by RT-PCR (Flu A&B, Covid) Anterior Nasal Swab     Status: None   Collection Time: 08/30/22  1:12 AM   Specimen: Anterior Nasal Swab  Result Value Ref Range Status   SARS Coronavirus 2 by RT PCR NEGATIVE NEGATIVE Final    Comment: (NOTE) SARS-CoV-2 target nucleic acids are NOT DETECTED.  The SARS-CoV-2 RNA is generally detectable in upper respiratory specimens during the acute phase of infection. The lowest concentration of SARS-CoV-2 viral copies this assay can detect is 138 copies/mL. A negative result does not preclude SARS-Cov-2 infection and should not be used as the sole basis for treatment or other patient  management decisions. A negative result may occur with  improper specimen collection/handling, submission of specimen other than nasopharyngeal swab, presence of viral mutation(s) within the areas targeted by this assay, and inadequate number of viral copies(<138 copies/mL). A negative result must be combined with clinical observations, patient history, and epidemiological information. The expected result is Negative.  Fact Sheet for Patients:  EntrepreneurPulse.com.au  Fact Sheet for Healthcare Providers:  IncredibleEmployment.be  This test is no t yet approved or cleared by the [place] FDA and  has been authorized for detection and/or diagnosis of SARS-CoV-2 by FDA under an Emergency Use Authorization (EUA). This EUA will remain  in effect (meaning this test can be used) for the duration of the COVID-19 declaration under Section 564(b)(1) of the Act, 21 U.S.C.section 360bbb-3(b)(1), unless the authorization is terminated  or revoked sooner.       Influenza A by  PCR NEGATIVE NEGATIVE Final   Influenza B by PCR NEGATIVE NEGATIVE Final    Comment: (NOTE) The Xpert Xpress SARS-CoV-2/FLU/RSV plus assay is intended as an aid in the diagnosis of influenza from Nasopharyngeal swab specimens and should not be used as a sole basis for treatment. Nasal washings and aspirates are unacceptable for Xpert Xpress SARS-CoV-2/FLU/RSV testing.  Fact Sheet for Patients: EntrepreneurPulse.com.au  Fact Sheet for Healthcare Providers: IncredibleEmployment.be  This test is not yet approved or cleared by the [place] FDA and has been authorized for detection and/or diagnosis of SARS-CoV-2 by FDA under an Emergency Use Authorization (EUA). This EUA will remain in effect (meaning this test can be used) for the duration of the COVID-19 declaration under Section 564(b)(1) of the Act, 21 U.S.C. section 360bbb-3(b)(1), unless the authorization is terminated or revoked.  Performed at [facility], [place] [address], [place], [place] [address]          Radiology Studies: ECHOCARDIOGRAM COMPLETE  Result Date: 08/30/2022    ECHOCARDIOGRAM REPORT   Patient Name:   CHANAH TIDMORE Date of Exam: 08/30/2022 Medical Rec #:  [id number]    Height:       63.0 in Accession #:    [id number]   Weight:       140.0 lb Date of Birth:  [birth year]     BSA:          1.662 m Patient Age:    27 years     BP:           216/89 mmHg Patient Gender: F            HR:           75 bpm. Exam Location:  Inpatient Procedure: 2D Echo, Color Doppler and Cardiac Doppler Indications:    elevated troponins  History:        Patient has no prior history of Echocardiogram examinations.                 COPD; Risk Factors:Dyslipidemia and Hypertension.  Sonographer:    Melissa Morford RDCS (AE, PE) Referring Phys: [id number] [place]  1. Left ventricular ejection fraction, by estimation, is 55 to 60%. The left ventricle has normal function. The left  ventricle has no regional wall motion abnormalities. Left ventricular diastolic parameters are indeterminate.  2. Right ventricular systolic function is normal. The right ventricular size is normal.  3. The mitral valve is normal in structure. Trivial mitral valve regurgitation.  4. The aortic valve is tricuspid. Aortic valve regurgitation is not visualized. Aortic valve sclerosis/calcification is  present, without any evidence of aortic stenosis.  5. The inferior vena cava is normal in size with greater than 50% respiratory variability, suggesting right atrial pressure of 3 mmHg. FINDINGS  Left Ventricle: Left ventricular ejection fraction, by estimation, is 55 to 60%. The left ventricle has normal function. The left ventricle has no regional wall motion abnormalities. The left ventricular internal cavity size was normal in size. There is  no left ventricular hypertrophy. Left ventricular diastolic parameters are indeterminate. Right Ventricle: The right ventricular size is normal. Right vetricular wall thickness was not assessed. Right ventricular systolic function is normal. Left Atrium: Left atrial size was normal in size. Right Atrium: Right atrial size was normal in size. Pericardium: There is no evidence of pericardial effusion. Mitral Valve: The mitral valve is normal in structure. Trivial mitral valve regurgitation. Tricuspid Valve: The tricuspid valve is normal in structure. Tricuspid valve regurgitation is trivial. Aortic Valve: The aortic valve is tricuspid. Aortic valve regurgitation is not visualized. Aortic valve sclerosis/calcification is present, without any evidence of aortic stenosis. Pulmonic Valve: The pulmonic valve was not well visualized. Pulmonic valve regurgitation is not visualized. No evidence of pulmonic stenosis. Aorta: The aortic root is normal in size and structure. Venous: The inferior vena cava is normal in size with greater than 50% respiratory variability, suggesting right atrial  pressure of 3 mmHg. IAS/Shunts: No atrial level shunt detected by color flow Doppler.  LEFT VENTRICLE PLAX 2D LVIDd:         4.40 cm   Diastology LVIDs:         3.20 cm   LV e' medial:    4.13 cm/s LV PW:         1.00 cm   LV E/e' medial:  22.0 LV IVS:        0.90 cm   LV e' lateral:   6.64 cm/s LVOT diam:     2.00 cm   LV E/e' lateral: 13.7 LV SV:         69 LV SV Index:   42 LVOT Area:     3.14 cm  RIGHT VENTRICLE RV S prime:     17.30 cm/s TAPSE (M-mode): 2.3 cm LEFT ATRIUM             Index        RIGHT ATRIUM           Index LA diam:        4.00 cm 2.41 cm/m   RA Area:     16.10 cm LA Vol (A2C):   66.9 ml 40.26 ml/m  RA Volume:   40.40 ml  24.31 ml/m LA Vol (A4C):   53.8 ml 32.38 ml/m LA Biplane Vol: 63.3 ml 38.09 ml/m  AORTIC VALVE LVOT Vmax:   109.00 cm/s LVOT Vmean:  69.800 cm/s LVOT VTI:    0.221 m  AORTA Ao Root diam: 2.70 cm MITRAL VALVE                TRICUSPID VALVE MV Area (PHT): 5.20 cm     TR Peak grad:   25.8 mmHg MV Decel Time: 146 msec     TR Vmax:        254.00 cm/s MV E velocity: 90.70 cm/s MV A velocity: 141.00 cm/s  SHUNTS MV E/A ratio:  0.64         Systemic VTI:  0.22 m  Systemic Diam: 2.00 cm Dorris Carnes MD Electronically signed by Dorris Carnes MD Signature Date/Time: 08/30/2022/2:52:36 PM    Final         Scheduled Meds:  amLODipine  10 mg Oral Daily   folic acid  1 mg Oral Daily   mometasone-formoterol  2 puff Inhalation BID   montelukast  10 mg Oral QHS   olopatadine  1 drop Both Eyes BID   pantoprazole  40 mg Oral Daily   risperiDONE  0.25 mg Oral Q12H   Continuous Infusions:  ciprofloxacin Stopped (08/31/22 1158)   lactated ringers 75 mL/hr at 08/31/22 1058          Sheryle Vice, MD [facility] 09/01/2022, 7:29 AM

## 2022-09-01 NOTE — Plan of Care (Signed)

## 2022-09-01 NOTE — Evaluation (Signed)
Physical Therapy Evaluation Patient Details Name: DIMONIQUE BOURDEAU MRN: [id number] DOB: 1940-11-20 Today's Date: 09/01/2022  History of Present Illness  81 year old female presented with acute psychosis/paranoia with history of COPD, essential hypertension, bipolar disorder.  UA suggestive of UTI, COVID-19/influenza PCR negative, chest x-ray negative for any acute cardiopulmonary process.  EKG showed no signs of ischemia.  She was started on IV fluids and antibiotics.  Psychiatry consulted.  Clinical Impression   Pt admitted with above diagnosis. Lives at home with daughter, in a 2-level home with a few steps to enter; bedroom and bathroom upstairs; Prior to admission, pt was able to manage independently, using a cane as needed, mostly at night for safety; Gets to the store, depends in others for rides; Presents to PT overall moving well; Naturally reached out for handheld assist with walking, but noted no overt balance deficits when prompted to let go and walk without UE support; Plan for another PT session to assess stair negotiation, and anticipate she will do well;  Pt currently with functional limitations due to the deficits listed below (see PT Problem List). Pt will benefit from skilled PT to increase their independence and safety with mobility to allow discharge to the venue listed below.           Recommendations for follow up therapy are one component of a multi-disciplinary discharge planning process, led by the attending physician.  Recommendations may be updated based on patient status, additional functional criteria and insurance authorization.  Follow Up Recommendations Outpatient PT (Pt will benefit from outpatient PT, but transportation may be a burden to pt and family)      Assistance Recommended at Discharge PRN  Patient can return home with the following  Assist for transportation    Equipment Recommendations None recommended by PT  Recommendations for Other Services        Functional Status Assessment Patient has had a recent decline in their functional status and demonstrates the ability to make significant improvements in function in a reasonable and predictable amount of time.     Precautions / Restrictions Precautions Precautions: None Restrictions Weight Bearing Restrictions: No      Mobility  Bed Mobility                    Transfers Overall transfer level: Independent Equipment used: None                    Ambulation/Gait Ambulation/Gait assistance: Supervision Gait Distance (Feet): 350 Feet Assistive device: 1 person hand held assist, None (Pt able to walk and gesture with hands when prompted to let go of PTs hand) Gait Pattern/deviations: WFL(Within Functional Limits)          Stairs            Wheelchair Mobility    Modified Rankin (Stroke Patients Only)       Balance Overall balance assessment: No apparent balance deficits (not formally assessed)                                           Pertinent Vitals/Pain Pain Assessment Pain Assessment: No/denies pain    Home Living Family/patient expects to be discharged to:: Private residence Living Arrangements: Children Available Help at Discharge: Family (Daughter works as Therapist, sports; unspecified hours) Type of Home: [employer] Layout: Two level;Bed/bath  upstairs (Pt has certain hours where she is on main floor vs. upstairs; upstairs is liveable) Home Equipment: Rolling Walker (2 wheels);Shower seat (Only uses walker at night for comfort and security)      Prior Function Prior Level of Function : Independent/Modified Independent             Mobility Comments: Motivated and active; wants to get back into a regular exercise plan; receptive to outpatient PT       Hand Dominance        Extremity/Trunk Assessment   Upper Extremity Assessment Upper Extremity Assessment: Overall WFL for tasks assessed    Lower  Extremity Assessment Lower Extremity Assessment: Overall WFL for tasks assessed    Cervical / Trunk Assessment Cervical / Trunk Assessment: Normal  Communication   Communication: No difficulties  Cognition Arousal/Alertness: Awake/alert Behavior During Therapy: WFL for tasks assessed/performed Overall Cognitive Status: Within Functional Limits for tasks assessed                                 General Comments: pleasant and very appreciative; no signs of paranoia/distress        General Comments General comments (skin integrity, edema, etc.): Pt had no acute distress with progression of ambulation on RA    Exercises     Assessment/Plan    PT Assessment Patient needs continued PT services (Likely only needs 1 or 2 more sessions)  PT Problem List Decreased activity tolerance       PT Treatment Interventions DME instruction;Gait training;Stair training;Functional mobility training;Therapeutic activities;Therapeutic exercise;Balance training;Neuromuscular re-education;Patient/family education    PT Goals (Current goals can be found in the Care Plan section)  Acute Rehab PT Goals Patient Stated Goal: Get back to regular exercise and activity PT Goal Formulation: With patient Time For Goal Achievement: 09/08/22 Potential to Achieve Goals: Good    Frequency Min 3X/week     Co-evaluation               AM-PAC PT "6 Clicks" Mobility  Outcome Measure Help needed turning from your back to your side while in a flat bed without using bedrails?: None Help needed moving from lying on your back to sitting on the side of a flat bed without using bedrails?: None Help needed moving to and from a bed to a chair (including a wheelchair)?: None Help needed standing up from a chair using your arms (e.g., wheelchair or bedside chair)?: None Help needed to walk in hospital room?: None Help needed climbing 3-5 steps with a railing? : A Little 6 Click Score: 23    End  of Session Equipment Utilized During Treatment: Gait belt Activity Tolerance: Patient tolerated treatment well Patient left: in chair;with family/visitor present Nurse Communication: Mobility status PT Visit Diagnosis: Other abnormalities of gait and mobility (R26.89)    Time: 0258-5277 PT Time Calculation (min) (ACUTE ONLY): 26 min   Charges:   PT Evaluation $PT Eval Low Complexity: 1 Low PT Treatments $Gait Training: 8-22 mins        Roney Marion, [place] [facility] [place] 09/01/2022, 5:35 PM

## 2022-09-02 DIAGNOSIS — N179 Acute kidney failure, unspecified: Secondary | ICD-10-CM | POA: Diagnosis not present

## 2022-09-02 LAB — BASIC METABOLIC PANEL
Anion gap: 10 (ref 5–15)
BUN: 17 mg/dL (ref 8–23)
CO2: 22 mmol/L (ref 22–32)
Calcium: 10 mg/dL (ref 8.9–10.3)
Chloride: 109 mmol/L (ref 98–111)
Creatinine, Ser: 1.27 mg/dL — ABNORMAL HIGH (ref 0.44–1.00)
GFR, Estimated: 43 mL/min — ABNORMAL LOW (ref >60)
Glucose, Bld: 114 mg/dL — ABNORMAL HIGH (ref 70–99)
Potassium: 3.5 mmol/L (ref 3.5–5.1)
Sodium: 141 mmol/L (ref 135–145)

## 2022-09-02 LAB — MAGNESIUM: Magnesium: 1.7 mg/dL (ref 1.7–2.4)

## 2022-09-02 MED ORDER — AMLODIPINE BESYLATE 10 MG PO TABS: 10.0000 mg | ORAL_TABLET | Freq: Every day | ORAL | 1 refills | Status: DC

## 2022-09-02 MED ORDER — CIPROFLOXACIN IN D5W 200 MG/100ML IV SOLN
200.0000 mg | Freq: Two times a day (BID) | INTRAVENOUS | Status: DC
  Administered 2022-09-02: 200 mg via INTRAVENOUS
  Filled 2022-09-02 (×2): qty 100

## 2022-09-02 MED ORDER — CLONIDINE HCL 0.2 MG PO TABS
0.2000 mg | ORAL_TABLET | ORAL | Status: DC | PRN
  Administered 2022-09-02 – 2022-09-03 (×2): 0.2 mg via ORAL
  Filled 2022-09-02 (×2): qty 1

## 2022-09-02 MED ORDER — RISPERIDONE 0.25 MG PO TABS: 0.2500 mg | ORAL_TABLET | Freq: Two times a day (BID) | ORAL | 1 refills | Status: DC

## 2022-09-02 MED ORDER — CIPROFLOXACIN HCL 500 MG PO TABS
500.0000 mg | ORAL_TABLET | Freq: Two times a day (BID) | ORAL | Status: DC
  Administered 2022-09-02 – 2022-09-04 (×4): 500 mg via ORAL
  Filled 2022-09-02 (×4): qty 1

## 2022-09-02 MED ORDER — CIPROFLOXACIN HCL 500 MG PO TABS: 500.0000 mg | ORAL_TABLET | Freq: Two times a day (BID) | ORAL | 0 refills | Status: AC

## 2022-09-02 NOTE — Discharge Instructions (Signed)
Advised to follow-up with primary care physician in 1 week. Advised to take ciprofloxacin 500 mg twice daily for 4 more days for UTI Advised to drink more fluids.

## 2022-09-02 NOTE — TOC Transition Note (Addendum)
Transition of Care [facility]) - CM/SW Discharge Note   Patient Details  Name: [name] [name] MRN: [id number] Date of Birth: [birth year]  [facility]) CM/SW Contact:  Cyndi Bender, RN Phone Number: 09/02/2022, 1:03 PM   Clinical Narrative:    Patient stable for discharge.  The RNCM and CSW spoke to patient and daughter at bedside.  Daughter asking about SNF. Patient does not qualify for SNF at this time.  ALF list and PACE information given to daughter. Patient agreeable for TOC to make psychiatry apt. Sent request to CMA to make apt.  Patient agreeable to go to OP rehab. Patient selected OP rehab at [place].   Walker ordered. Patient is agreeable to use in house providers. [place] with [employer] notified of order.  No other TOC needs at this time.   Final next level of care: OP Rehab (Simultaneous filing. User may not have seen previous data.) Barriers to Discharge: Barriers Resolved (Simultaneous filing. User may not have seen previous data.)   Patient Goals and CMS Choice Patient states their goals for this hospitalization and ongoing recovery are:: Stabalization CMS Medicare.gov Compare Post Acute Care list provided to:: Patient Represenative (must comment) (Simultaneous filing. User may not have seen previous data.) Choice offered to / list presented to : Adult Children (Simultaneous filing. User may not have seen previous data.)  Discharge Placement               Home        Discharge Plan and Services In-house Referral: Clinical Social Work Discharge Planning Services: CM Consult, Follow-up appt scheduled (Simultaneous filing. User may not have seen previous data.)            DME Arranged: Gilford Rile DME Agency: [employer] Date DME Agency Contacted: 09/02/22 Time DME Agency Contacted: [phone] Representative spoke with at DME Agency: Annie Sable            Social Determinants of Health ([place]) Interventions     Readmission Risk Interventions     09/02/2022    1:03 PM  Readmission Risk Prevention Plan  Post Dischage Appt Complete  Medication Screening Complete  Transportation Screening Complete

## 2022-09-02 NOTE — Progress Notes (Signed)
Physical Therapy Treatment Patient Details Name: [name] [name] MRN: [id number] DOB: 1941/05/12 Today's Date: 09/02/2022   History of Present Illness 81 year old female presented with acute psychosis/paranoia with history of COPD, essential hypertension, bipolar disorder.  UA suggestive of UTI, COVID-19/influenza PCR negative, chest x-ray negative for any acute cardiopulmonary process.  EKG showed no signs of ischemia.  She was started on IV fluids and antibiotics.  Psychiatry consulted.    PT Comments    Pt ambulated with mildly unsteady gait. Easily distracted in hallway. Supervision transfers, min guard assist amb 300' without AD, and min guard assist ascend/descend 12 steps with R rail. Pt's daughter expressed concerns regarding pt going home. She reports pt has refused to work with [facility] in the past, therefore, will leave recommendation as OPPT. Also recommended pt use RW at home, if gait remains unsteady.   Recommendations for follow up therapy are one component of a multi-disciplinary discharge planning process, led by the attending physician.  Recommendations may be updated based on patient status, additional functional criteria and insurance authorization.  Follow Up Recommendations  Outpatient PT     Assistance Recommended at Discharge PRN  Patient can return home with the following Assist for transportation   Equipment Recommendations  None recommended by PT    Recommendations for Other Services       Precautions / Restrictions Precautions Precautions: Fall     Mobility  Bed Mobility Overal bed mobility: Modified Independent                  Transfers Overall transfer level: Needs assistance Equipment used: None Transfers: Sit to/from Stand Sit to Stand: Supervision                Ambulation/Gait Ambulation/Gait assistance: Min guard Gait Distance (Feet): 300 Feet Assistive device: None, 1 person hand held assist Gait Pattern/deviations: Step-through  pattern Gait velocity: decreased Gait velocity interpretation: 1.31 - 2.62 ft/sec, indicative of limited community ambulator   General Gait Details: LOB x 1. Able to self correct with min guard assist.   Stairs Stairs: Yes Stairs assistance: Min guard Stair Management: One rail Right, Forwards, Alternating pattern Number of Stairs: 12     Wheelchair Mobility    Modified Rankin (Stroke Patients Only)       Balance Overall balance assessment: Mild deficits observed, not formally tested                                          Cognition Arousal/Alertness: Awake/alert Behavior During Therapy: WFL for tasks assessed/performed Overall Cognitive Status: Within Functional Limits for tasks assessed                                 General Comments: verbose, tangential        Exercises      General Comments General comments (skin integrity, edema, etc.): VSS on RA      Pertinent Vitals/Pain Pain Assessment Pain Assessment: No/denies pain    Home Living                          Prior Function            PT Goals (current goals can now be found in the care plan section) Acute Rehab PT Goals Patient Stated Goal: home  Progress towards PT goals: Progressing toward goals    Frequency    Min 3X/week      PT Plan Current plan remains appropriate    Co-evaluation              AM-PAC PT "6 Clicks" Mobility   Outcome Measure  Help needed turning from your back to your side while in a flat bed without using bedrails?: None Help needed moving from lying on your back to sitting on the side of a flat bed without using bedrails?: None Help needed moving to and from a bed to a chair (including a wheelchair)?: A Little Help needed standing up from a chair using your arms (e.g., wheelchair or bedside chair)?: A Little Help needed to walk in hospital room?: A Little Help needed climbing 3-5 steps with a railing? : A  Little 6 Click Score: 20    End of Session Equipment Utilized During Treatment: Gait belt Activity Tolerance: Patient tolerated treatment well Patient left: in chair;with call bell/phone within reach;with family/visitor present Nurse Communication: Mobility status PT Visit Diagnosis: Other abnormalities of gait and mobility (R26.89)     Time: 1000-1024 PT Time Calculation (min) (ACUTE ONLY): 24 min  Charges:  $Gait Training: 23-37 mins                     Lorrin Goodell, PT  Office # [phone] Pager [phone]    [name] [name] 09/02/2022, 11:10 AM
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
## 2022-09-02 NOTE — TOC Initial Note (Addendum)
Transition of Care [facility]) - Initial/Assessment Note    Patient Details  Name: [name] [name] MRN: [id number] Date of Birth: Apr 06, 1941  Transition of Care [facility]) CM/SW Contact:    Benard Halsted, LCSW Phone Number: 09/02/2022, 1:04 PM  Clinical Narrative:                 CSW received request to speak with patient's daughter at bedside. CSW and RNCM spoke with patient and daughter. Daughter reported concerns about patient returning home and requested short term rehab at a facility. CSW explained that patient does not meet criteria for that at this time but discussed ALF placement and provided a facility list. CSW also provided resources on the [facility] program, which patient is familiar with. She has transportation through [employer] and [employer]. Discussed op psych follow up and RNCM will request CMA schedule appt at [facility] as requested. Patient has also been to [facility] before. RNCM to have walker delivered to room. Daughter reported that they have been through in home services before but patient does not want them to come in, which is frustrating for daughter because she works at night. CSW provided supportive listening.   Of note, per MD notes patient's IVC has been rescinded. CSW confirmed Commitment Change paperwork is signed in patient's hard chart.   Expected Discharge Plan: OP Rehab (Simultaneous filing. User may not have seen previous data.) Barriers to Discharge: Barriers Resolved (Simultaneous filing. User may not have seen previous data.)   Patient Goals and CMS Choice Patient states their goals for this hospitalization and ongoing recovery are:: Stabalization CMS Medicare.gov Compare Post Acute Care list provided to:: Patient Represenative (must comment) (Simultaneous filing. User may not have seen previous data.) Choice offered to / list presented to : Adult Children (Simultaneous filing. User may not have seen previous data.)  Expected Discharge Plan and Services Expected  Discharge Plan: OP Rehab (Simultaneous filing. User may not have seen previous data.) In-house Referral: Clinical Social Work Discharge Planning Services: CM Consult, Follow-up appt scheduled (Simultaneous filing. User may not have seen previous data.)   Living arrangements for the past 2 months: Single Family Home (Simultaneous filing. User may not have seen previous data.) Expected Discharge Date: 09/02/22               DME Arranged: Gilford Rile DME Agency: [employer] Date DME Agency Contacted: 09/02/22 Time DME Agency Contacted: [phone] Representative spoke with at DME Agency: Annie Sable            Prior Living Arrangements/Services Living arrangements for the past 2 months: [place] (Simultaneous filing. User may not have seen previous data.) Lives with:: Adult Children (Simultaneous filing. User may not have seen previous data.) Patient language and need for interpreter reviewed:: Yes (Simultaneous filing. User may not have seen previous data.) Do you feel safe going back to the place where you live?: Yes (Simultaneous filing. User may not have seen previous data.)      Need for Family Participation in Patient Care: Yes (Comment) (Simultaneous filing. User may not have seen previous data.) Care giver support system in place?: Yes (comment) (Simultaneous filing. User may not have seen previous data.) Current home services: DME Kasandra Knudsen) Criminal Activity/Legal Involvement Pertinent to Current Situation/Hospitalization: No - Comment as needed (Simultaneous filing. User may not have seen previous data.)  Activities of Daily Living Home Assistive Devices/Equipment: Cane (specify quad or straight) ADL Screening (condition at time of admission) Patient's cognitive ability adequate to safely complete daily activities?:  Yes Is the patient deaf or have difficulty hearing?: No Does the patient have difficulty seeing, even when wearing glasses/contacts?: No Does the patient have difficulty  concentrating, remembering, or making decisions?: No Patient able to express need for assistance with ADLs?: No Does the patient have difficulty dressing or bathing?: No Independently performs ADLs?: Yes (appropriate for developmental age) Does the patient have difficulty walking or climbing stairs?: No Weakness of Legs: None Weakness of Arms/Hands: None  Permission Sought/Granted Permission sought to share information with : Family Supports Permission granted to share information with : Yes, Verbal Permission Granted  Share Information with NAME: [name]     Permission granted to share info w Relationship: Daughter  Permission granted to share info w Contact Information: [phone]  Emotional Assessment Appearance:: Appears stated age (Simultaneous filing. User may not have seen previous data.) Attitude/Demeanor/Rapport: Engaged Affect (typically observed): Appropriate Orientation: : Oriented to Self, Oriented to Place, Oriented to  Time, Oriented to Situation Alcohol / Substance Use: Not Applicable (Simultaneous filing. User may not have seen previous data.) Psych Involvement: Yes (comment) (Simultaneous filing. User may not have seen previous data.)  Admission diagnosis:  Lower urinary tract infectious disease [N39.0] Delirium [R41.0] AKI (acute kidney injury) ([place]) [N17.9] Elevated troponin I measurement [R79.89] Psychosis ([employer]) [F29] Patient Active Problem List   Diagnosis Date Noted   AKI (acute kidney injury) ([place]) 08/30/2022   Acute cystitis 08/30/2022   Elevated troponin 08/30/2022   Psychosis ([place]) 08/30/2022   Hypercalcemia 08/30/2022   Dehydration 08/30/2022   COPD (chronic obstructive pulmonary disease) ([place]) 08/30/2022   Urinary incontinence 05/07/2022   Acute pain of right knee 12/25/2016   Eosinophilia 03/31/2015   Lung nodules 01/02/2015   Disorder of left rotator cuff 10/24/2014   Benign neoplasm of colon 03/29/2014   GERD (gastroesophageal reflux  disease) 03/11/2014   Vitamin D deficiency 09/16/2013   Coronary artery calcification 04/10/2013   Routine health maintenance 07/26/2012   Bradycardia 02/18/2012   Chronic renal insufficiency, stage II (mild) 10/30/2011   Essential hypertension 10/25/2011   Gastroparesis 07/22/2011   Asthma, severe persistent 05/05/2011   Pulmonary nodule, right 05/05/2011   ALLERGY, FOOD 07/10/2010   Anxiety 06/18/2010   COLONIC POLYPS, HYPERPLASTIC, HX OF 04/13/2008   OSA (obstructive sleep apnea) 03/06/2008   Allergic rhinitis 02/22/2008   Hyperlipidemia 01/08/2008   Osteoporosis 05/30/2007   PCP:  Hoyt Koch, MD Pharmacy:   [id number] [address] [place] [id number]: [phone]: [phone]    Social Determinants of Health (SDOH) Interventions    Readmission Risk Interventions    09/02/2022    1:03 PM  Readmission Risk Prevention Plan  Post Dischage Appt Complete  Medication Screening Complete  Transportation Screening Complete

## 2022-09-02 NOTE — Progress Notes (Signed)
  X-cover Note: RN reports family requesting that no patient not receive another PIV. Stopped IV cipro. Started po cipro.   Kristopher Oppenheim, DO [facility]

## 2022-09-02 NOTE — Discharge Summary (Addendum)
Physician Discharge Summary  [name] [name] HDQ:[id number] DOB: [birth year] DOA: 08/30/2022  PCP: Hoyt Koch, MD  Admit date: 08/30/2022  Discharge date: 09/03/22  Admitted From: Home.  Disposition:  Home.  Recommendations for Outpatient Follow-up:  Follow up with PCP in 1-2 weeks. Please obtain BMP/CBC in one week. Advised to take ciprofloxacin 500 mg twice daily for 4 more days for UTI. Advised to drink more fluids.  Home Health: None Equipment/Devices: None  Discharge Condition: Stable CODE STATUS:Full code Diet recommendation: Heart Healthy   Brief [facility] Course: This 81 year old female with history of COPD, essential hypertension, bipolar disorder was brought to the hospital IVC'd because of acute psychosis /paranoia.  On presentation, creatinine was 2.06 compared to 1.14 in January 2023, calcium of 11.3, initial high-sensitivity troponin of 33, UA suggestive of UTI, COVID-19/influenza PCR negative, chest x-ray negative for any acute cardiopulmonary process.  EKG showed no signs of ischemia.  She was started on IV fluids and antibiotics.  Psychiatry consulted.  Psych medications adjusted.  Patient was continued on supportive care IV hydration.  Renal functions has improved.  Serum calcium normalized.  Urine culture growing Klebsiella pneumonia pansensitive, patient was reassessed by psychiatry, IVC discontinued.  Patient has significantly improved, continued on Risperdal.  Patient does not need inpatient psych hospitalization.  PT and OT recommended outpatient PT.  Psychiatry signed off.  Patient was discharged home on 10/23.  Patient has developed hallucinations before discharge.  Patient was kept overnight for observation.  In the morning she felt better , denies any further hallucinations.  Patient wants to be discharged.  Patient is being discharged home on 09/03/22.  Discharge Diagnoses:  Principal Problem:   AKI (acute kidney injury) ([place]) Active Problems:    Essential hypertension   Acute cystitis   Elevated troponin   Psychosis (HCC)   Hypercalcemia   Dehydration   COPD (chronic obstructive pulmonary disease) (HCC)  Acute kidney injury: Dehydration: Acute metabolic acidosis: -Presented with creatinine of 2.09, compared to 1.14 in January 2023.   -Treated with IV fluids.  Creatinine 1.44 this morning.  DC IV fluids.   -Renal functions improved.   Hypercalcemia: -Possibly from dehydration and acute kidney injury.   -Resolved with IV fluids.   UTI/acute cystitis: Present on admission -Continue ciprofloxacin.  Urine culture growing Klebsiella pneumoniae.  Pansensitive   Elevated troponin: -Possibly from demand ischemia.  Initial troponin was 33 and troponin only went up to 48.   No chest pain or ischemic changes on EKG.   Echo showed EF of 55 to 60% with no regional wall motion abnormalities.   No further work-up needed.   Acute psychosis: History of bipolar disorder: -brought to the hospital IVC'd because of acute psychosis/paranoia -Psychiatry evaluation appreciated: -Continue bedside sitter for now.  IVC has been rescinded by psychiatry.   She has been started on Risperdal.  Patient does not need inpatient psychiatric hospitalization -B12 is 482.  Continue folic acid supplementation   Hypomagnesemia: -Improved.   Normocytic anemia: -Possibly from anemia of chronic disease.   Hemoglobin stable.  No signs of bleeding.  Monitor intermittently.   Hypertension: -Blood pressure currently stable.  Continue increased dose of amlodipine.   COPD: -Stable.  Continue Dulera and montelukast.  Use inhaler/nebs as needed   Physical deconditioning: -PT eval  Discharge Instructions  Discharge Instructions     Ambulatory referral to Physical Therapy   Complete by: As directed    Call MD for:  difficulty breathing, headache or visual disturbances  Complete by: As directed    Call MD for:  persistant dizziness or light-headedness    Complete by: As directed    Call MD for:  persistant nausea and vomiting   Complete by: As directed    Diet - low sodium heart healthy   Complete by: As directed    Diet Carb Modified   Complete by: As directed    Discharge instructions   Complete by: As directed    Advised to follow-up with primary care physician in 1 week. Advised to take ciprofloxacin 500 mg twice daily for 4 more days for UTI Advised to drink more fluids.   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   Latex Anaphylaxis, Swelling   Albuterol Anxiety   Switched to xopenex   Advair Diskus [fluticasone-salmeterol]    Tingling in mouth/ears ringing   Diclofenac Sodium    REACTION: Hives   Diclofenac Sodium Swelling   Doxycycline Nausea And Vomiting   Dulera [mometasone Furo-formoterol Fum]    HA   Other Swelling   Penicillins Swelling   Tongue swelling   Sulfonamide Derivatives    Tongue swelling   Tiotropium Itching, Other (See Comments)   dysuria   Tiotropium Bromide Monohydrate    Tongue/mouth itching Dysuria         Medication List     TAKE these medications    amLODipine 10 MG tablet Commonly known as: NORVASC Take 1 tablet (10 mg total) by mouth daily. What changed:  medication strength how much to take   ciprofloxacin 500 MG tablet Commonly known as: Cipro Take 1 tablet (500 mg total) by mouth 2 (two) times daily for 4 days.   [employer] D3 50 MCG (2000 UT) Caps Generic drug: Cholecalciferol Take 1 capsule (2,000 Units total) by mouth daily.   EPINEPHrine 0.3 mg/0.3 mL Soaj injection Commonly known as: EPI-PEN INJECT 0.3 MLS INTO THE MUSCLE ONCE FOR 1 DOSE.   montelukast 10 MG tablet Commonly known as: SINGULAIR Take 1 tablet (10 mg total) by mouth at bedtime.   NexIUM 40 MG capsule Generic drug: esomeprazole Take 1 capsule (40 mg total) by  mouth daily at 12 noon.   PATADAY OP Place 1 drop into both eyes daily.   risperiDONE 0.25 MG tablet Commonly known as: RISPERDAL Take 1 tablet (0.25 mg total) by mouth every 12 (twelve) hours.   Symbicort 160-4.5 MCG/ACT inhaler Generic drug: budesonide-formoterol INHALE 2 PUFFS INTO THE LUNGS IN THE MORNING AND AT BEDTIME. What changed: additional instructions   Xopenex 0.63 MG/3ML nebulizer solution Generic drug: levalbuterol Take 3 mLs (0.63 mg total) by nebulization every 4 (four) hours as needed for wheezing or shortness of breath.   Xopenex HFA 45 MCG/ACT inhaler Generic drug: levalbuterol INHALE 1 TO 2 PUFFS BY MOUTH EVERY 6 HOURS AS NEEDED FOR WHEEZE What changed: See the new instructions.               Durable Medical Equipment  (From admission, onward)           Start     Ordered   09/02/22 1200  For home use only DME Walker rolling  Once       Question Answer Comment  Walker: With 5 Inch Wheels   Patient needs a walker to treat with the following condition Weakness  09/02/22 [place]. Schedule an appointment as soon as possible for a visit.   Specialty: Rehabilitation Why: Call to schedule apt for out patient Physical therapy Contact information: [place]. [phone] [place]        Hoyt Koch, MD Follow up in 1 week(s).   Specialty: Internal Medicine Contact information: [place] [address] [phone]         Freida Busman, MD Follow up.   Why: TIME  : 2:30 PM DATE: DECEMBER 11 , 2023 LOCATION:  [address] [place] , [place] [address] PHONE # [phone] Contact information: [address] [place] [address] [phone]                Allergies  Allergen Reactions   Influenza Vaccines Swelling    Pt  allergic to eggs---Anaphylactic Shock   Latex Anaphylaxis and Swelling   Albuterol Anxiety    Switched to xopenex   Advair Diskus [Fluticasone-Salmeterol]     Tingling in mouth/ears ringing   Diclofenac Sodium     REACTION: Hives   Diclofenac Sodium Swelling   Doxycycline Nausea And Vomiting   Dulera [Mometasone Furo-Formoterol Fum]     HA   Other Swelling   Penicillins Swelling    Tongue swelling   Sulfonamide Derivatives     Tongue swelling    Tiotropium Itching and Other (See Comments)    dysuria   Tiotropium Bromide Monohydrate     Tongue/mouth itching Dysuria     Consultations: None   Procedures/Studies: ECHOCARDIOGRAM COMPLETE  Result Date: 08/30/2022    ECHOCARDIOGRAM REPORT   Patient Name:   [name] [name] Date of Exam: 08/30/2022 Medical Rec #:  [id number]    Height:       63.0 in Accession #:    [id number]   Weight:       140.0 lb Date of Birth:  07/26/1941     BSA:          1.662 m Patient Age:    11 years     BP:           216/89 mmHg Patient Gender: F            HR:           75 bpm. Exam Location:  Inpatient Procedure: 2D Echo, Color Doppler and Cardiac Doppler Indications:    elevated troponins  History:        Patient has no prior history of Echocardiogram examinations.                 COPD; Risk Factors:Dyslipidemia and Hypertension.  Sonographer:    Melissa Morford RDCS (AE, PE) Referring Phys: [id number] [place]  1. Left ventricular ejection fraction, by estimation, is 55 to 60%. The left ventricle has normal function. The left ventricle has no regional wall motion abnormalities. Left ventricular diastolic parameters are indeterminate.  2. Right ventricular systolic function is normal. The right ventricular size is normal.  3. The mitral valve is normal in structure. Trivial mitral valve regurgitation.  4. The aortic valve is tricuspid. Aortic valve regurgitation is not visualized. Aortic valve sclerosis/calcification is present, without any evidence  of aortic stenosis.  5. The inferior vena cava is normal in size with greater than  50% respiratory variability, suggesting right atrial pressure of 3 mmHg. FINDINGS  Left Ventricle: Left ventricular ejection fraction, by estimation, is 55 to 60%. The left ventricle has normal function. The left ventricle has no regional wall motion abnormalities. The left ventricular internal cavity size was normal in size. There is  no left ventricular hypertrophy. Left ventricular diastolic parameters are indeterminate. Right Ventricle: The right ventricular size is normal. Right vetricular wall thickness was not assessed. Right ventricular systolic function is normal. Left Atrium: Left atrial size was normal in size. Right Atrium: Right atrial size was normal in size. Pericardium: There is no evidence of pericardial effusion. Mitral Valve: The mitral valve is normal in structure. Trivial mitral valve regurgitation. Tricuspid Valve: The tricuspid valve is normal in structure. Tricuspid valve regurgitation is trivial. Aortic Valve: The aortic valve is tricuspid. Aortic valve regurgitation is not visualized. Aortic valve sclerosis/calcification is present, without any evidence of aortic stenosis. Pulmonic Valve: The pulmonic valve was not well visualized. Pulmonic valve regurgitation is not visualized. No evidence of pulmonic stenosis. Aorta: The aortic root is normal in size and structure. Venous: The inferior vena cava is normal in size with greater than 50% respiratory variability, suggesting right atrial pressure of 3 mmHg. IAS/Shunts: No atrial level shunt detected by color flow Doppler.  LEFT VENTRICLE PLAX 2D LVIDd:         4.40 cm   Diastology LVIDs:         3.20 cm   LV e' medial:    4.13 cm/s LV PW:         1.00 cm   LV E/e' medial:  22.0 LV IVS:        0.90 cm   LV e' lateral:   6.64 cm/s LVOT diam:     2.00 cm   LV E/e' lateral: 13.7 LV SV:         69 LV SV Index:   42 LVOT Area:     3.14 cm  RIGHT VENTRICLE RV S prime:      17.30 cm/s TAPSE (M-mode): 2.3 cm LEFT ATRIUM             Index        RIGHT ATRIUM           Index LA diam:        4.00 cm 2.41 cm/m   RA Area:     16.10 cm LA Vol (A2C):   66.9 ml 40.26 ml/m  RA Volume:   40.40 ml  24.31 ml/m LA Vol (A4C):   53.8 ml 32.38 ml/m LA Biplane Vol: 63.3 ml 38.09 ml/m  AORTIC VALVE LVOT Vmax:   109.00 cm/s LVOT Vmean:  69.800 cm/s LVOT VTI:    0.221 m  AORTA Ao Root diam: 2.70 cm MITRAL VALVE                TRICUSPID VALVE MV Area (PHT): 5.20 cm     TR Peak grad:   25.8 mmHg MV Decel Time: 146 msec     TR Vmax:        254.00 cm/s MV E velocity: 90.70 cm/s MV A velocity: 141.00 cm/s  SHUNTS MV E/A ratio:  0.64         Systemic VTI:  0.22 m                             Systemic Diam: 2.00 cm Dorris Carnes MD Electronically signed  by Dorris Carnes MD Signature Date/Time: 08/30/2022/2:52:36 PM    Final    DG Chest 2 View  Result Date: 08/30/2022 CLINICAL DATA:  Altered mental status EXAM: CHEST - 2 VIEW COMPARISON:  08/24/2015 FINDINGS: Heart is borderline in size. No confluent airspace opacities, effusions or edema. No acute bony abnormality. IMPRESSION: No active cardiopulmonary disease. Electronically Signed   By: Rolm Baptise M.D.   On: 08/30/2022 01:46      Subjective: Patient was seen and examined at bedside.  Overnight events noted.   Patient reports feeling better.  Patient is being discharged home.  Discharge Exam: Vitals:   09/02/22 2317 09/03/22 0758  BP: (!) 140/56   Pulse: 76   Resp: 20 16  Temp: 99 F (37.2 C) 98.7 F (37.1 C)  SpO2: 93%    Vitals:   09/02/22 2001 09/02/22 2059 09/02/22 2317 09/03/22 0758  BP:  (!) 151/101 (!) 140/56   Pulse: 85  76   Resp: '17  20 16  '$ Temp:   99 F (37.2 C) 98.7 F (37.1 C)  TempSrc:   Oral Oral  SpO2: 98%  93%   Weight:      Height:        General: Pt is alert, awake, not in acute distress Cardiovascular: RRR, S1/S2 +, no rubs, no gallops Respiratory: CTA bilaterally, no wheezing, no  rhonchi Abdominal: Soft, NT, ND, bowel sounds + Extremities: no edema, no cyanosis    The results of significant diagnostics from this hospitalization (including imaging, microbiology, ancillary and laboratory) are listed below for reference.     Microbiology: Recent Results (from the past 240 hour(s))  Urine Culture     Status: Abnormal   Collection Time: 08/30/22 12:48 AM   Specimen: Urine, Clean Catch  Result Value Ref Range Status   Specimen Description URINE, CLEAN CATCH  Final   Special Requests   Final    NONE Performed at [facility], [address], [place], [place] [address]    Culture >=100,000 COLONIES/mL KLEBSIELLA PNEUMONIAE (A)  Final   Report Status 09/01/2022 FINAL  Final   Organism ID, Bacteria KLEBSIELLA PNEUMONIAE (A)  Final      Susceptibility   Klebsiella pneumoniae - MIC*    AMPICILLIN RESISTANT Resistant     CEFAZOLIN <=4 SENSITIVE Sensitive     CEFEPIME <=0.12 SENSITIVE Sensitive     CEFTRIAXONE <=0.25 SENSITIVE Sensitive     CIPROFLOXACIN <=0.25 SENSITIVE Sensitive     GENTAMICIN <=1 SENSITIVE Sensitive     IMIPENEM <=0.25 SENSITIVE Sensitive     NITROFURANTOIN <=16 SENSITIVE Sensitive     TRIMETH/SULFA <=20 SENSITIVE Sensitive     AMPICILLIN/SULBACTAM <=2 SENSITIVE Sensitive     PIP/TAZO <=4 SENSITIVE Sensitive     * >=100,000 COLONIES/mL KLEBSIELLA PNEUMONIAE  Resp Panel by RT-PCR (Flu A&B, Covid) Anterior Nasal Swab     Status: None   Collection Time: 08/30/22  1:12 AM   Specimen: Anterior Nasal Swab  Result Value Ref Range Status   SARS Coronavirus 2 by RT PCR NEGATIVE NEGATIVE Final    Comment: (NOTE) SARS-CoV-2 target nucleic acids are NOT DETECTED.  The SARS-CoV-2 RNA is generally detectable in upper respiratory specimens during the acute phase of infection. The lowest concentration of SARS-CoV-2 viral copies this assay can detect is 138 copies/mL. A negative result does not preclude SARS-Cov-2 infection and should not be used  as the sole basis for treatment or other patient management decisions. A negative result may occur  with  improper specimen collection/handling, submission of specimen other than nasopharyngeal swab, presence of viral mutation(s) within the areas targeted by this assay, and inadequate number of viral copies(<138 copies/mL). A negative result must be combined with clinical observations, patient history, and epidemiological information. The expected result is Negative.  Fact Sheet for Patients:  EntrepreneurPulse.com.au  Fact Sheet for Healthcare Providers:  IncredibleEmployment.be  This test is no t yet approved or cleared by the [place] FDA and  has been authorized for detection and/or diagnosis of SARS-CoV-2 by FDA under an Emergency Use Authorization (EUA). This EUA will remain  in effect (meaning this test can be used) for the duration of the COVID-19 declaration under Section 564(b)(1) of the Act, 21 U.S.C.section 360bbb-3(b)(1), unless the authorization is terminated  or revoked sooner.       Influenza A by PCR NEGATIVE NEGATIVE Final   Influenza B by PCR NEGATIVE NEGATIVE Final    Comment: (NOTE) The Xpert Xpress SARS-CoV-2/FLU/RSV plus assay is intended as an aid in the diagnosis of influenza from Nasopharyngeal swab specimens and should not be used as a sole basis for treatment. Nasal washings and aspirates are unacceptable for Xpert Xpress SARS-CoV-2/FLU/RSV testing.  Fact Sheet for Patients: EntrepreneurPulse.com.au  Fact Sheet for Healthcare Providers: IncredibleEmployment.be  This test is not yet approved or cleared by the [place] FDA and has been authorized for detection and/or diagnosis of SARS-CoV-2 by FDA under an Emergency Use Authorization (EUA). This EUA will remain in effect (meaning this test can be used) for the duration of the COVID-19 declaration under Section 564(b)(1)  of the Act, 21 U.S.C. section 360bbb-3(b)(1), unless the authorization is terminated or revoked.  Performed at [facility], [place] [address], [place], [place] [address]      Labs: BNP (last 3 results) No results for input(s): "BNP" in the last 8760 hours. Basic Metabolic Panel: Recent Labs  Lab 08/30/22 0120 08/30/22 0241 08/30/22 0805 08/31/22 0428 09/01/22 0207 09/02/22 0357  NA 142 141 140 139 140 141  K 3.9 4.0 3.8 3.5 3.6 3.5  CL 110  --  110 111 111 109  CO2 19*  --  21* 21* 23 22  GLUCOSE 104*  --  81 101* 96 114*  BUN 26*  --  25* '18 17 17  '$ CREATININE 2.09*  --  1.70* 1.38* 1.44* 1.27*  CALCIUM 10.9*  --  10.0 10.0 10.4* 10.0  MG  --   --  1.7 1.6* 2.1 1.7  PHOS  --   --  3.0  --   --   --    Liver Function Tests: Recent Labs  Lab 08/30/22 0120 08/30/22 0805 08/31/22 0428  AST '25 30 31  '$ ALT '13 13 13  '$ ALKPHOS 63 60 61  BILITOT 0.6 0.4 0.5  PROT 7.3 7.2 6.7  ALBUMIN 3.6 3.3* 3.1*   No results for input(s): "LIPASE", "AMYLASE" in the last 168 hours. No results for input(s): "AMMONIA" in the last 168 hours. CBC: Recent Labs  Lab 08/30/22 0120 08/30/22 0241 08/30/22 0805 08/31/22 0428  WBC 6.6  --  8.1 7.8  NEUTROABS 4.6  --  5.0 4.5  HGB 11.9* 13.9 11.4* 11.6*  HCT 37.6 41.0 35.3* 35.8*  MCV 89.7  --  89.4 87.1  PLT 196  --  207 213   Cardiac Enzymes: Recent Labs  Lab 08/30/22 0805  CKTOTAL 611*   BNP: Invalid input(s): "POCBNP" CBG: No results for input(s): "GLUCAP" in the last 168  hours. D-Dimer No results for input(s): "DDIMER" in the last 72 hours. Hgb A1c No results for input(s): "HGBA1C" in the last 72 hours. Lipid Profile No results for input(s): "CHOL", "HDL", "LDLCALC", "TRIG", "CHOLHDL", "LDLDIRECT" in the last 72 hours. Thyroid function studies No results for input(s): "TSH", "T4TOTAL", "T3FREE", "THYROIDAB" in the last 72 hours.  Invalid input(s): "FREET3"  Anemia work up No results for input(s): "VITAMINB12",  "FOLATE", "FERRITIN", "TIBC", "IRON", "RETICCTPCT" in the last 72 hours.  Urinalysis    Component Value Date/Time   COLORURINE YELLOW 08/30/2022 0048   APPEARANCEUR HAZY (A) 08/30/2022 0048   LABSPEC 1.016 08/30/2022 0048   PHURINE 5.0 08/30/2022 0048   GLUCOSEU NEGATIVE 08/30/2022 0048   GLUCOSEU NEGATIVE 09/15/2013 1412   HGBUR MODERATE (A) 08/30/2022 0048   BILIRUBINUR NEGATIVE 08/30/2022 0048   KETONESUR 20 (A) 08/30/2022 0048   PROTEINUR 100 (A) 08/30/2022 0048   UROBILINOGEN 0.2 09/15/2013 1412   NITRITE NEGATIVE 08/30/2022 0048   LEUKOCYTESUR SMALL (A) 08/30/2022 0048   Sepsis Labs Recent Labs  Lab 08/30/22 0120 08/30/22 0805 08/31/22 0428  WBC 6.6 8.1 7.8   Microbiology Recent Results (from the past 240 hour(s))  Urine Culture     Status: Abnormal   Collection Time: 08/30/22 12:48 AM   Specimen: Urine, Clean Catch  Result Value Ref Range Status   Specimen Description URINE, CLEAN CATCH  Final   Special Requests   Final    NONE Performed at [facility], [place] [address], [place], [place] [address]    Culture >=100,000 COLONIES/mL KLEBSIELLA PNEUMONIAE (A)  Final   Report Status 09/01/2022 FINAL  Final   Organism ID, Bacteria KLEBSIELLA PNEUMONIAE (A)  Final      Susceptibility   Klebsiella pneumoniae - MIC*    AMPICILLIN RESISTANT Resistant     CEFAZOLIN <=4 SENSITIVE Sensitive     CEFEPIME <=0.12 SENSITIVE Sensitive     CEFTRIAXONE <=0.25 SENSITIVE Sensitive     CIPROFLOXACIN <=0.25 SENSITIVE Sensitive     GENTAMICIN <=1 SENSITIVE Sensitive     IMIPENEM <=0.25 SENSITIVE Sensitive     NITROFURANTOIN <=16 SENSITIVE Sensitive     TRIMETH/SULFA <=20 SENSITIVE Sensitive     AMPICILLIN/SULBACTAM <=2 SENSITIVE Sensitive     PIP/TAZO <=4 SENSITIVE Sensitive     * >=100,000 COLONIES/mL KLEBSIELLA PNEUMONIAE  Resp Panel by RT-PCR (Flu A&B, Covid) Anterior Nasal Swab     Status: None   Collection Time: 08/30/22  1:12 AM   Specimen: Anterior Nasal Swab   Result Value Ref Range Status   SARS Coronavirus 2 by RT PCR NEGATIVE NEGATIVE Final    Comment: (NOTE) SARS-CoV-2 target nucleic acids are NOT DETECTED.  The SARS-CoV-2 RNA is generally detectable in upper respiratory specimens during the acute phase of infection. The lowest concentration of SARS-CoV-2 viral copies this assay can detect is 138 copies/mL. A negative result does not preclude SARS-Cov-2 infection and should not be used as the sole basis for treatment or other patient management decisions. A negative result may occur with  improper specimen collection/handling, submission of specimen other than nasopharyngeal swab, presence of viral mutation(s) within the areas targeted by this assay, and inadequate number of viral copies(<138 copies/mL). A negative result must be combined with clinical observations, patient history, and epidemiological information. The expected result is Negative.  Fact Sheet for Patients:  EntrepreneurPulse.com.au  Fact Sheet for Healthcare Providers:  IncredibleEmployment.be  This test is no t yet approved or cleared by the [place] and  has been authorized for detection and/or diagnosis of SARS-CoV-2 by FDA under an Emergency Use Authorization (EUA). This EUA will remain  in effect (meaning this test can be used) for the duration of the COVID-19 declaration under Section 564(b)(1) of the Act, 21 U.S.C.section 360bbb-3(b)(1), unless the authorization is terminated  or revoked sooner.       Influenza A by PCR NEGATIVE NEGATIVE Final   Influenza B by PCR NEGATIVE NEGATIVE Final    Comment: (NOTE) The Xpert Xpress SARS-CoV-2/FLU/RSV plus assay is intended as an aid in the diagnosis of influenza from Nasopharyngeal swab specimens and should not be used as a sole basis for treatment. Nasal washings and aspirates are unacceptable for Xpert Xpress SARS-CoV-2/FLU/RSV testing.  Fact Sheet for  Patients: EntrepreneurPulse.com.au  Fact Sheet for Healthcare Providers: IncredibleEmployment.be  This test is not yet approved or cleared by the [place] FDA and has been authorized for detection and/or diagnosis of SARS-CoV-2 by FDA under an Emergency Use Authorization (EUA). This EUA will remain in effect (meaning this test can be used) for the duration of the COVID-19 declaration under Section 564(b)(1) of the Act, 21 U.S.C. section 360bbb-3(b)(1), unless the authorization is terminated or revoked.  Performed at [facility], [place] [address], [place], [place] [address]      Time coordinating discharge: Over 30 minutes  SIGNED:   Shawna Clamp, MD  [facility] 09/03/2022, 11:19 AM Pager   If 7PM-7AM, please contact night-coverage

## 2022-09-03 DIAGNOSIS — N179 Acute kidney failure, unspecified: Secondary | ICD-10-CM | POA: Diagnosis not present

## 2022-09-03 NOTE — TOC Transition Note (Addendum)
Transition of Care [facility]) - CM/SW Discharge Note   Patient Details  Name: [name] [name] MRN: [id number] Date of Birth: 07-15-1941  Transition of Care [facility]) CM/SW Contact:  Benard Halsted, LCSW Phone Number: 09/03/2022, 12:32 PM   Clinical Narrative:    CSW notified that patient did not discharge yesterday as planned. Per MD, patient is medically stable for discharge. Per RN, patient's daughter is not wanting patient to discharge home. CSW staffed case with [facility], Denyse Amass, and she advised to contact [place] to speak with patient's daughter as TOC has already provided resources and has no other options available for patient. Of note, patient dc order went in yesterday so unable to appeal discharge at this point.    Final next level of care: OP Rehab (Simultaneous filing. User may not have seen previous data.) Barriers to Discharge: Barriers Resolved (Simultaneous filing. User may not have seen previous data.)   Patient Goals and CMS Choice Patient states their goals for this hospitalization and ongoing recovery are:: Stabalization CMS Medicare.gov Compare Post Acute Care list provided to:: Patient Represenative (must comment) (Simultaneous filing. User may not have seen previous data.) Choice offered to / list presented to : Adult Children (Simultaneous filing. User may not have seen previous data.)  Discharge Placement                       Discharge Plan and Services In-house Referral: Clinical Social Work Discharge Planning Services: CM Consult, Follow-up appt scheduled (Simultaneous filing. User may not have seen previous data.)            DME Arranged: Gilford Rile DME Agency: [employer] Date DME Agency Contacted: 09/02/22 Time DME Agency Contacted: [phone] Representative spoke with at DME Agency: Annie Sable            Social Determinants of Health ([place]) Interventions     Readmission Risk Interventions    09/02/2022    1:03 PM  Readmission Risk Prevention  Plan  Post Dischage Appt Complete  Medication Screening Complete  Transportation Screening Complete

## 2022-09-03 NOTE — Progress Notes (Signed)
Went in to speak with daughter about her concerns of her mother being discharge home today.  She feels that her mom is unsafe to be discharge because she is exhibiting the same behaviors she was having on Thursday, when she had to IVC her to get her to the hospital. While this nurse was in the room patient was having disorganized thinking.  She first stated, that it was 3 hours since she ate a chicken salad sandwich and had that last night.  Her breakfast tray was still in the room not touch, which her daughter ordered this morning.  She also stated I had came in her room earlier today and that she knew me from church and knew when my daughter was born. Sent a message to Dr. Dwyane Dee to see if psych could come back and re-eval patient at daughters request.  Will continue to follow.  Alphonzo Lemmings, RN

## 2022-09-03 NOTE — Progress Notes (Signed)
PROGRESS NOTE    [name] [name]  TMH:[id number] DOB: 11/24/1940 DOA: 08/30/2022 PCP: Hoyt Koch, MD    Brief Narrative:  This 81 year old female with history of COPD, essential hypertension, bipolar disorder was brought to the hospital IVC'd because of acute psychosis /paranoia.  On presentation, creatinine was 2.06 compared to 1.14 in January 2023, calcium of 11.3, initial high-sensitivity troponin of 33, UA suggestive of UTI, COVID-19/influenza PCR negative, chest x-ray negative for any acute cardiopulmonary process.  EKG showed no signs of ischemia.  She was started on IV fluids and antibiotics.  Psychiatry consulted.  Psych medications adjusted.  Patient was continued on supportive care IV hydration.  Renal functions has improved.  Serum calcium normalized.  Urine culture growing Klebsiella pneumonia pansensitive, patient was reassessed by psychiatry, IVC discontinued. Patient has significantly improved, continued on Risperdal.  Patient does not need inpatient psych hospitalization.  PT and OT recommended outpatient PT.  Patient was discharged home yesterday but she has developed hallucinations.  Family want reassessment by psychiatry.   Assessment & Plan:   Principal Problem:   AKI (acute kidney injury) ([place]) Active Problems:   Essential hypertension   Acute cystitis   Elevated troponin   Psychosis (HCC)   Hypercalcemia   Dehydration   COPD (chronic obstructive pulmonary disease) (HCC)  Acute kidney injury: > Improved. Dehydration: Acute metabolic acidosis: -Presented with creatinine of 2.09, compared to 1.14 in January 2023.   -Treated with IV fluids.  Creatinine 1.27 this morning.  DC IV fluids.   -Renal functions improved.   Hypercalcemia: -Possibly from dehydration and acute kidney injury.   -Resolved with IV fluids.   UTI/acute cystitis: Present on admission -Continue ciprofloxacin.  Urine culture growing Klebsiella pneumoniae.  Pansensitive. -She is discharged  on ciprofloxacin 500 twice daily for next 5 days.   Elevated troponin: -Possibly from demand ischemia.  Initial troponin was 33 and troponin only went up to 48.   No chest pain or ischemic changes on EKG.   Echo showed EF of 55 to 60% with no regional wall motion abnormalities.   No further work-up needed.   Acute psychosis: History of bipolar disorder: -brought to the hospital IVC'd because of acute psychosis/paranoia -Psychiatry evaluation appreciated: -Continue bedside sitter for now.  IVC has been rescinded by psychiatry.   She has been started on Risperdal.  Patient does not need inpatient psychiatric hospitalization -B12 is 482.  Continue folic acid supplementation. -Patient has developed visual hallucinations last night before discharge. -Psych reconsulted for assessment.  Family request psych eval.   Hypomagnesemia: -Improved.   Normocytic anemia: -Possibly from anemia of chronic disease.   Hemoglobin stable.  No signs of bleeding.  Monitor intermittently.   Hypertension: -Blood pressure currently stable.  Continue increased dose of amlodipine.   COPD: -Stable.  Continue Dulera and montelukast.  Use inhaler/nebs as needed   Physical deconditioning: -PT eval > Outpatient PT.    DVT prophylaxis: Lovenox Code Status:Full code Family Communication: Daughter at bed side Disposition Plan:   Status is: Inpatient Remains inpatient appropriate because: Patient was discharged home yesterday.  Then she developed visual hallucination. Family wants psych reassessment.   Anticipated discharge home once cleared from psychiatry  Consultants:  None  Procedures: None Antimicrobials:  Anti-infectives (From admission, onward)    Start     Dose/Rate Route Frequency Ordered Stop   09/02/22 2115  ciprofloxacin (CIPRO) tablet 500 mg        500 mg Oral 2 times daily 09/02/22 2025 09/06/22  1959   09/02/22 0945  ciprofloxacin (CIPRO) IVPB 200 mg  Status:  Discontinued       Note  to Pharmacy: Cipro 400 mg IV q24 hours for CrCl < 30 mL/min   200 mg 100 mL/hr over 60 Minutes Intravenous Every 12 hours 09/02/22 0938 09/02/22 2025   09/02/22 0000  ciprofloxacin (CIPRO) 500 MG tablet        500 mg Oral 2 times daily 09/02/22 1221 09/06/22 2359   08/31/22 0800  ciprofloxacin (CIPRO) IVPB 400 mg  Status:  Discontinued       Note to Pharmacy: Cipro 400 mg IV q24 hours for CrCl < 30 mL/min   400 mg 200 mL/hr over 60 Minutes Intravenous Daily 08/30/22 0545 09/02/22 0938   08/30/22 0430  ciprofloxacin (CIPRO) IVPB 400 mg        400 mg 200 mL/hr over 60 Minutes Intravenous  Once 08/30/22 0419 08/30/22 0618       Subjective: Patient was seen and examined at bedside.  Overnight events noted. Patient was discharged yesterday but then she developed visual hallucination before discharge.  Discharge was held. Patient reports doing better,  denies any hallucinations today.  She wants to be discharged. Daughter wants psych reassessment before discharge.  Objective: Vitals:   09/02/22 2001 09/02/22 2059 09/02/22 2317 09/03/22 0758  BP:  (!) 151/101 (!) 140/56   Pulse: 85  76   Resp: '17  20 16  '$ Temp:   99 F (37.2 C) 98.7 F (37.1 C)  TempSrc:   Oral Oral  SpO2: 98%  93%   Weight:      Height:        Intake/Output Summary (Last 24 hours) at 09/03/2022 1459 Last data filed at 09/02/2022 2016 Gross per 24 hour  Intake 185.74 ml  Output --  Net 185.74 ml   Filed Weights   08/30/22 0130 09/02/22 0313  Weight: 63.5 kg 65.7 kg    Examination:  General exam: Appears comfortable, not in any acute distress, deconditioned. Respiratory system: CTA bilaterally, respiratory effort normal, RR 15 Cardiovascular system: S1 & S2 heard, regular rate and rhythm, no murmur. Gastrointestinal system: Abdomen is soft, non tender, non distended, BS+ Central nervous system: Alert and oriented x 3. No focal neurological deficits. Extremities: No edema, no cyanosis, no clubbing. Skin:  No rashes, lesions or ulcers Psychiatry: Judgement and insight appear normal. Mood & affect appropriate.     Data Reviewed: I have personally reviewed following labs and imaging studies  CBC: Recent Labs  Lab 08/30/22 0120 08/30/22 0241 08/30/22 0805 08/31/22 0428  WBC 6.6  --  8.1 7.8  NEUTROABS 4.6  --  5.0 4.5  HGB 11.9* 13.9 11.4* 11.6*  HCT 37.6 41.0 35.3* 35.8*  MCV 89.7  --  89.4 87.1  PLT 196  --  207 790   Basic Metabolic Panel: Recent Labs  Lab 08/30/22 0120 08/30/22 0241 08/30/22 0805 08/31/22 0428 09/01/22 0207 09/02/22 0357  NA 142 141 140 139 140 141  K 3.9 4.0 3.8 3.5 3.6 3.5  CL 110  --  110 111 111 109  CO2 19*  --  21* 21* 23 22  GLUCOSE 104*  --  81 101* 96 114*  BUN 26*  --  25* '18 17 17  '$ CREATININE 2.09*  --  1.70* 1.38* 1.44* 1.27*  CALCIUM 10.9*  --  10.0 10.0 10.4* 10.0  MG  --   --  1.7 1.6* 2.1 1.7  PHOS  --   --  3.0  --   --   --    GFR: Estimated Creatinine Clearance: 32.7 mL/min (A) (by C-G formula based on SCr of 1.27 mg/dL (H)). Liver Function Tests: Recent Labs  Lab 08/30/22 0120 08/30/22 0805 08/31/22 0428  AST '25 30 31  '$ ALT '13 13 13  '$ ALKPHOS 63 60 61  BILITOT 0.6 0.4 0.5  PROT 7.3 7.2 6.7  ALBUMIN 3.6 3.3* 3.1*   No results for input(s): "LIPASE", "AMYLASE" in the last 168 hours. No results for input(s): "AMMONIA" in the last 168 hours. Coagulation Profile: No results for input(s): "INR", "PROTIME" in the last 168 hours. Cardiac Enzymes: Recent Labs  Lab 08/30/22 0805  CKTOTAL 611*   BNP (last 3 results) No results for input(s): "PROBNP" in the last 8760 hours. HbA1C: No results for input(s): "HGBA1C" in the last 72 hours. CBG: No results for input(s): "GLUCAP" in the last 168 hours. Lipid Profile: No results for input(s): "CHOL", "HDL", "LDLCALC", "TRIG", "CHOLHDL", "LDLDIRECT" in the last 72 hours. Thyroid Function Tests: No results for input(s): "TSH", "T4TOTAL", "FREET4", "T3FREE", "THYROIDAB" in the  last 72 hours. Anemia Panel: No results for input(s): "VITAMINB12", "FOLATE", "FERRITIN", "TIBC", "IRON", "RETICCTPCT" in the last 72 hours. Sepsis Labs: No results for input(s): "PROCALCITON", "LATICACIDVEN" in the last 168 hours.  Recent Results (from the past 240 hour(s))  Urine Culture     Status: Abnormal   Collection Time: 08/30/22 12:48 AM   Specimen: Urine, Clean Catch  Result Value Ref Range Status   Specimen Description URINE, CLEAN CATCH  Final   Special Requests   Final    NONE Performed at [facility], [address], [place], [place] [address]    Culture >=100,000 COLONIES/mL KLEBSIELLA PNEUMONIAE (A)  Final   Report Status 09/01/2022 FINAL  Final   Organism ID, Bacteria KLEBSIELLA PNEUMONIAE (A)  Final      Susceptibility   Klebsiella pneumoniae - MIC*    AMPICILLIN RESISTANT Resistant     CEFAZOLIN <=4 SENSITIVE Sensitive     CEFEPIME <=0.12 SENSITIVE Sensitive     CEFTRIAXONE <=0.25 SENSITIVE Sensitive     CIPROFLOXACIN <=0.25 SENSITIVE Sensitive     GENTAMICIN <=1 SENSITIVE Sensitive     IMIPENEM <=0.25 SENSITIVE Sensitive     NITROFURANTOIN <=16 SENSITIVE Sensitive     TRIMETH/SULFA <=20 SENSITIVE Sensitive     AMPICILLIN/SULBACTAM <=2 SENSITIVE Sensitive     PIP/TAZO <=4 SENSITIVE Sensitive     * >=100,000 COLONIES/mL KLEBSIELLA PNEUMONIAE  Resp Panel by RT-PCR (Flu A&B, Covid) Anterior Nasal Swab     Status: None   Collection Time: 08/30/22  1:12 AM   Specimen: Anterior Nasal Swab  Result Value Ref Range Status   SARS Coronavirus 2 by RT PCR NEGATIVE NEGATIVE Final    Comment: (NOTE) SARS-CoV-2 target nucleic acids are NOT DETECTED.  The SARS-CoV-2 RNA is generally detectable in upper respiratory specimens during the acute phase of infection. The lowest concentration of SARS-CoV-2 viral copies this assay can detect is 138 copies/mL. A negative result does not preclude SARS-Cov-2 infection and should not be used as the sole basis for treatment  or other patient management decisions. A negative result may occur with  improper specimen collection/handling, submission of specimen other than nasopharyngeal swab, presence of viral mutation(s) within the areas targeted by this assay, and inadequate number of viral copies(<138 copies/mL). A negative result must be combined with clinical observations, patient history, and epidemiological information. The expected result is Negative.  Fact Sheet  for Patients:  EntrepreneurPulse.com.au  Fact Sheet for Healthcare Providers:  IncredibleEmployment.be  This test is no t yet approved or cleared by the [place] FDA and  has been authorized for detection and/or diagnosis of SARS-CoV-2 by FDA under an Emergency Use Authorization (EUA). This EUA will remain  in effect (meaning this test can be used) for the duration of the COVID-19 declaration under Section 564(b)(1) of the Act, 21 U.S.C.section 360bbb-3(b)(1), unless the authorization is terminated  or revoked sooner.       Influenza A by PCR NEGATIVE NEGATIVE Final   Influenza B by PCR NEGATIVE NEGATIVE Final    Comment: (NOTE) The Xpert Xpress SARS-CoV-2/FLU/RSV plus assay is intended as an aid in the diagnosis of influenza from Nasopharyngeal swab specimens and should not be used as a sole basis for treatment. Nasal washings and aspirates are unacceptable for Xpert Xpress SARS-CoV-2/FLU/RSV testing.  Fact Sheet for Patients: EntrepreneurPulse.com.au  Fact Sheet for Healthcare Providers: IncredibleEmployment.be  This test is not yet approved or cleared by the [place] FDA and has been authorized for detection and/or diagnosis of SARS-CoV-2 by FDA under an Emergency Use Authorization (EUA). This EUA will remain in effect (meaning this test can be used) for the duration of the COVID-19 declaration under Section 564(b)(1) of the Act, 21 U.S.C. section  360bbb-3(b)(1), unless the authorization is terminated or revoked.  Performed at [facility], [place] [address] [place], [place] [address]     Radiology Studies: No results found.  Scheduled Meds:  amLODipine  10 mg Oral Daily   ciprofloxacin  500 mg Oral BID   folic acid  1 mg Oral Daily   mometasone-formoterol  2 puff Inhalation BID   montelukast  10 mg Oral QHS   olopatadine  1 drop Both Eyes BID   pantoprazole  40 mg Oral Daily   risperiDONE  0.25 mg Oral Q12H   Continuous Infusions:   LOS: 3 days    Time spent: 35 mins    Yaser Harvill, MD [facility]   If 7PM-7AM, please contact night-coverage

## 2022-09-03 NOTE — Plan of Care (Signed)

## 2022-09-03 NOTE — Progress Notes (Signed)
Pt with disorganized thinking. Able to answer orientation question such as name, DOB, and where she was at. Pt with moments of rambling such as when this RN answered phone stating it was her friend that was [place] pick her up. Also speaking about an order she had canceled. Daughter at bedside during this episode. Pts nighttime meds given and bed alarm placed on.

## 2022-09-03 NOTE — Care Management Important Message (Signed)
Important Message  Patient Details  Name: [name] [name] MRN: [id number] Date of Birth: 1941/10/25   Medicare Important Message Given:  Yes  Due to illness patient was not able to sign Sign copy left  at the patient bedside.    Adriyanna Christians 09/03/2022, 3:41 PM

## 2022-09-04 NOTE — Plan of Care (Signed)

## 2022-09-04 NOTE — Discharge Summary (Addendum)
Initial discharge summary completed on 09/02/2022. No further changes at this time. Patient did have episode of hallucination on 10/24 and family requested reevaluation by psychiatry.  This was secondary to delirium which was also confirmed by psychiatry the following day.  No further changes in her medications at this time. Today patient is doing significantly better, having good luck conversations with me and daughters at the bedside as well. Vital signs remained stable at this time.  No further changes.  Daughter is in process of looking at ALF's for long-term placement.  Medically stable for discharge at this time.  Time spent 80mns total today  AGerlean RenMD [facility]
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
## 2022-09-04 NOTE — Progress Notes (Signed)
   09/04/22 1510  AVS Discharge Documentation  AVS Discharge Instructions Including Medications Provided to patient/caregiver  Name of Person Receiving AVS Discharge Instructions Including Medications [name] [name]  Name of Clinician That Reviewed AVS Discharge Instructions Including Medications Venida Jarvis, RN

## 2022-09-05 ENCOUNTER — Telehealth: Payer: Self-pay

## 2022-09-05 NOTE — Telephone Encounter (Signed)
Transition Care Management Follow-up Telephone [place] 09-04-22 Dx: AKI Date of discharge and from where:  How have you been since you were released from the hospital? Doing better  Any questions or concerns? No  Items Reviewed: Did the pt receive and understand the discharge instructions provided? Yes  Medications obtained and verified? Yes  Other? No  Any new allergies since your discharge? No  Dietary orders reviewed? Yes Do you have support at home? Yes   Home Care and Equipment/Supplies: Were home health services ordered? no If so, what is the name of the agency? na  Has the agency set up a time to come to the patient's home? not applicable Were any new equipment or medical supplies ordered?  Yes- Rolling walker  What is the name of the medical supply agency? Hospital  Were you able to get the supplies/equipment? No- was told to get at outpatient therapy  Do you have any questions related to the use of the equipment or supplies? No  Functional Questionnaire: (I = Independent and D = Dependent) ADLs: I  Bathing/Dressing- I  Meal Prep- I  Eating- I  Maintaining continence- D- daughter manages  Transferring/Ambulation- I- STAND BY ASSISTANCE   Managing Meds- I  Follow up appointments reviewed:  PCP Hospital f/u appt confirmed? Yes  Scheduled to see Dr Sharlet Salina on 09-12-22 @ [facility] f/u appt confirmed? Yes  Scheduled to see Dr Jolly Mango PT on 09-12-22 @ 115pm.and Psychiatrist on 09-24-22 Are transportation arrangements needed? No  If their condition worsens, is the pt aware to call PCP or go to the Emergency Dept.? Yes Was the patient provided with contact information for the PCP's office or ED? Yes Was to pt encouraged to call back with questions or concerns? Yes   Juanda Crumble LPN [place] Direct Dial [phone]

## 2022-09-12 ENCOUNTER — Ambulatory Visit: Payer: Medicare Other | Attending: Family Medicine | Admitting: Physical Therapy

## 2022-09-12 ENCOUNTER — Encounter: Payer: Self-pay | Admitting: Physical Therapy

## 2022-09-12 ENCOUNTER — Ambulatory Visit (INDEPENDENT_AMBULATORY_CARE_PROVIDER_SITE_OTHER): Payer: Medicare Other | Admitting: Internal Medicine

## 2022-09-12 VITALS — BP 160/80 | HR 73 | Temp 98.3°F | Ht 63.0 in | Wt 146.0 lb

## 2022-09-12 DIAGNOSIS — J4550 Severe persistent asthma, uncomplicated: Secondary | ICD-10-CM

## 2022-09-12 DIAGNOSIS — R278 Other lack of coordination: Secondary | ICD-10-CM | POA: Insufficient documentation

## 2022-09-12 DIAGNOSIS — J449 Chronic obstructive pulmonary disease, unspecified: Secondary | ICD-10-CM | POA: Diagnosis not present

## 2022-09-12 DIAGNOSIS — R293 Abnormal posture: Secondary | ICD-10-CM | POA: Insufficient documentation

## 2022-09-12 DIAGNOSIS — N3000 Acute cystitis without hematuria: Secondary | ICD-10-CM

## 2022-09-12 DIAGNOSIS — N179 Acute kidney failure, unspecified: Secondary | ICD-10-CM | POA: Diagnosis not present

## 2022-09-12 DIAGNOSIS — F29 Unspecified psychosis not due to a substance or known physiological condition: Secondary | ICD-10-CM

## 2022-09-12 DIAGNOSIS — M6281 Muscle weakness (generalized): Secondary | ICD-10-CM | POA: Diagnosis not present

## 2022-09-12 DIAGNOSIS — R2681 Unsteadiness on feet: Secondary | ICD-10-CM | POA: Insufficient documentation

## 2022-09-12 DIAGNOSIS — R262 Difficulty in walking, not elsewhere classified: Secondary | ICD-10-CM | POA: Insufficient documentation

## 2022-09-12 DIAGNOSIS — I10 Essential (primary) hypertension: Secondary | ICD-10-CM

## 2022-09-12 LAB — CBC
HCT: 36.2 % (ref 36.0–46.0)
Hemoglobin: 11.4 g/dL — ABNORMAL LOW (ref 12.0–15.0)
MCHC: 31.6 g/dL (ref 30.0–36.0)
MCV: 86.9 fl (ref 78.0–100.0)
Platelets: 295 10*3/uL (ref 150.0–400.0)
RBC: 4.17 Mil/uL (ref 3.87–5.11)
RDW: 13.5 % (ref 11.5–15.5)
WBC: 6.5 10*3/uL (ref 4.0–10.5)

## 2022-09-12 LAB — COMPREHENSIVE METABOLIC PANEL
ALT: 12 U/L (ref 0–35)
AST: 17 U/L (ref 0–37)
Albumin: 3.7 g/dL (ref 3.5–5.2)
Alkaline Phosphatase: 62 U/L (ref 39–117)
BUN: 19 mg/dL (ref 6–23)
CO2: 29 mEq/L (ref 19–32)
Calcium: 10.5 mg/dL (ref 8.4–10.5)
Chloride: 106 mEq/L (ref 96–112)
Creatinine, Ser: 1.13 mg/dL (ref 0.40–1.20)
GFR: 46.25 mL/min — ABNORMAL LOW (ref >60.00)
Glucose, Bld: 107 mg/dL — ABNORMAL HIGH (ref 70–99)
Potassium: 3.9 mEq/L (ref 3.5–5.1)
Sodium: 140 mEq/L (ref 135–145)
Total Bilirubin: 0.3 mg/dL (ref 0.2–1.2)
Total Protein: 7.8 g/dL (ref 6.0–8.3)

## 2022-09-12 MED ORDER — CVS D3 50 MCG (2000 UT) PO CAPS: 2000.0000 [IU] | ORAL_CAPSULE | Freq: Every day | ORAL | 3 refills | Status: DC

## 2022-09-12 MED ORDER — AMLODIPINE BESYLATE 10 MG PO TABS: 10.0000 mg | ORAL_TABLET | Freq: Every day | ORAL | 3 refills | Status: DC

## 2022-09-12 MED ORDER — PANTOPRAZOLE SODIUM 40 MG PO TBEC: 40.0000 mg | DELAYED_RELEASE_TABLET | Freq: Every day | ORAL | 3 refills | Status: DC

## 2022-09-12 MED ORDER — MONTELUKAST SODIUM 10 MG PO TABS: 10.0000 mg | ORAL_TABLET | Freq: Every day | ORAL | 1 refills | Status: DC

## 2022-09-12 NOTE — Patient Instructions (Addendum)
We will recheck the labs today.  We have sent in the singulair and the protonix.
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
## 2022-09-12 NOTE — Progress Notes (Signed)
   Subjective:   Patient ID: [name] [name], female    DOB: February 25, 1941, 81 y.o.   MRN: [id number]  HPI The patient is a 81 YO female coming in for hospital follow up (in for psychosis, AKI, urine infection). She was started on haldol and she is drowsy in the morning. Family with her helps provide history. She is still tired and appetite okay but not normal. They have a spot for her in ALF and need papers today. No constipation or diarrhea or chest pain. No burning with urination and have finished antibiotics.   PMH, [facility], social history reviewed and updated  Review of Systems  Constitutional:  Positive for activity change and appetite change.  HENT: Negative.    Eyes: Negative.   Respiratory:  Negative for cough, chest tightness and shortness of breath.   Cardiovascular:  Negative for chest pain, palpitations and leg swelling.  Gastrointestinal:  Negative for abdominal distention, abdominal pain, constipation, diarrhea, nausea and vomiting.  Musculoskeletal: Negative.   Skin: Negative.   Neurological:  Positive for weakness.  Psychiatric/Behavioral:  Positive for decreased concentration.     Objective:  Physical Exam Constitutional:      Appearance: She is well-developed.  HENT:     Head: Normocephalic and atraumatic.  Cardiovascular:     Rate and Rhythm: Normal rate and regular rhythm.  Pulmonary:     Effort: Pulmonary effort is normal. No respiratory distress.     Breath sounds: Normal breath sounds. No wheezing or rales.  Abdominal:     General: Bowel sounds are normal. There is no distension.     Palpations: Abdomen is soft.     Tenderness: There is no abdominal tenderness. There is no rebound.  Musculoskeletal:     Cervical back: Normal range of motion.  Skin:    General: Skin is warm and dry.  Neurological:     Mental Status: She is alert.     Coordination: Coordination normal.     Comments: Oriented to self and place     Vitals:   09/12/22 0920 09/12/22 0923  BP:  (!) 160/80 (!) 160/80  Pulse: 73   Temp: 98.3 F (36.8 C)   TempSrc: Oral   SpO2: 97%   Weight: 146 lb (66.2 kg)   Height: '5\' 3"'$  (1.6 m)    [place]  Assessment & Plan:

## 2022-09-13 ENCOUNTER — Telehealth: Payer: Self-pay | Admitting: Internal Medicine

## 2022-09-13 ENCOUNTER — Encounter: Payer: Self-pay | Admitting: Internal Medicine

## 2022-09-13 NOTE — Telephone Encounter (Signed)
Patient daughter [name] requested call back from clinical to get some assistance on filling out a form for assited living and an update about covid shot and whether she should get it. Call back is [phone]

## 2022-09-13 NOTE — Assessment & Plan Note (Signed)
Likely more urinary sepsis since she had severe delirium in the hospital. She has finished antibiotics and this is resolved.

## 2022-09-13 NOTE — Assessment & Plan Note (Signed)
She did have significant delirium in the hospital and was started on haldol 0.25 mg qhs and will continue on that. She is moving to ALF in near future.

## 2022-09-13 NOTE — Assessment & Plan Note (Signed)
No flare today and breathing well.

## 2022-09-13 NOTE — Assessment & Plan Note (Signed)
Checking CBC and CMP today to assess. She is eating better now. BP at goal.

## 2022-09-13 NOTE — Assessment & Plan Note (Signed)
Checking CMP and taking amlodipine 10 mg daily which was a change in the hospital which was refilled today. Adjust as needed.

## 2022-09-18 NOTE — Telephone Encounter (Signed)
When was her last covid-19 booster shot? If she has not had one in last 4 months she is eligible for the updated covid-19 booster available at pharmacies.

## 2022-09-19 NOTE — Telephone Encounter (Signed)
We can never do PPD skin TB tests on Thursdays due to requirement to recheck a skin test 48-72 hours later.

## 2022-09-20 ENCOUNTER — Ambulatory Visit (INDEPENDENT_AMBULATORY_CARE_PROVIDER_SITE_OTHER): Payer: Medicare Other | Admitting: Internal Medicine

## 2022-09-20 ENCOUNTER — Ambulatory Visit: Payer: Medicare Other | Admitting: Physical Therapy

## 2022-09-20 DIAGNOSIS — Z111 Encounter for screening for respiratory tuberculosis: Secondary | ICD-10-CM

## 2022-09-20 NOTE — Telephone Encounter (Signed)
Contacted patient and left another voicemail

## 2022-09-20 NOTE — Telephone Encounter (Signed)
It can be done any day of the week but Thursdays

## 2022-09-23 ENCOUNTER — Ambulatory Visit: Payer: Medicare Other

## 2022-09-23 LAB — TB SKIN TEST
Induration: 0 mm
TB Skin Test: NEGATIVE

## 2022-09-23 NOTE — Progress Notes (Signed)
Tb skin test was read- negative, induration 0 mm

## 2022-09-24 ENCOUNTER — Encounter: Payer: Self-pay | Admitting: Physical Therapy

## 2022-09-24 ENCOUNTER — Ambulatory Visit: Payer: Medicare Other | Admitting: Physical Therapy

## 2022-09-24 DIAGNOSIS — R278 Other lack of coordination: Secondary | ICD-10-CM | POA: Diagnosis not present

## 2022-09-24 DIAGNOSIS — J449 Chronic obstructive pulmonary disease, unspecified: Secondary | ICD-10-CM | POA: Diagnosis not present

## 2022-09-24 DIAGNOSIS — M6281 Muscle weakness (generalized): Secondary | ICD-10-CM | POA: Diagnosis not present

## 2022-09-24 DIAGNOSIS — R262 Difficulty in walking, not elsewhere classified: Secondary | ICD-10-CM

## 2022-09-24 DIAGNOSIS — R2681 Unsteadiness on feet: Secondary | ICD-10-CM | POA: Diagnosis not present

## 2022-09-24 DIAGNOSIS — R293 Abnormal posture: Secondary | ICD-10-CM | POA: Diagnosis not present
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
## 2022-09-26 ENCOUNTER — Ambulatory Visit: Payer: Medicare Other | Admitting: Physical Therapy

## 2022-09-30 ENCOUNTER — Ambulatory Visit: Payer: Medicare Other | Admitting: Physical Therapy

## 2022-10-02 ENCOUNTER — Ambulatory Visit (INDEPENDENT_AMBULATORY_CARE_PROVIDER_SITE_OTHER): Payer: Medicare Other | Admitting: Internal Medicine

## 2022-10-02 ENCOUNTER — Encounter: Payer: Self-pay | Admitting: Internal Medicine

## 2022-10-02 ENCOUNTER — Ambulatory Visit: Payer: Medicare Other

## 2022-10-02 ENCOUNTER — Ambulatory Visit: Payer: Medicare Other | Admitting: Internal Medicine

## 2022-10-02 VITALS — BP 148/84 | HR 65 | Temp 98.0°F

## 2022-10-02 DIAGNOSIS — F319 Bipolar disorder, unspecified: Secondary | ICD-10-CM

## 2022-10-02 DIAGNOSIS — R410 Disorientation, unspecified: Secondary | ICD-10-CM

## 2022-10-02 DIAGNOSIS — F29 Unspecified psychosis not due to a substance or known physiological condition: Secondary | ICD-10-CM | POA: Diagnosis not present

## 2022-10-02 DIAGNOSIS — N3946 Mixed incontinence: Secondary | ICD-10-CM | POA: Diagnosis not present

## 2022-10-02 MED ORDER — OLANZAPINE 5 MG PO TABS: 5.0000 mg | ORAL_TABLET | Freq: Every day | ORAL | 3 refills | Status: DC

## 2022-10-02 MED ORDER — FLUOXETINE HCL 10 MG PO TABS: 10.0000 mg | ORAL_TABLET | Freq: Every day | ORAL | 3 refills | Status: DC

## 2022-10-02 MED ORDER — DIAZEPAM 5 MG PO TABS: 5.0000 mg | ORAL_TABLET | Freq: Three times a day (TID) | ORAL | 1 refills | Status: DC | PRN

## 2022-10-02 NOTE — Assessment & Plan Note (Signed)
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  I do not see any signs of a medical illness present at the moment.  We will obtain another urine test.  Obtain head CT today.  No history of dementia. We will discontinue Risperdal.  I prescribed Zyprexa 5 mg at at bedtime.  Diazepam 5-10 mg 3 times daily as needed agitation.  We can start fluoxetine in a couple days. Risks of using Zyprexa, benzodiazepines in elderly is discussed with daughter. Go to ER if problems Follow-up with [name] in 2 weeks Psychiatry appointment is pending on December 5.

## 2022-10-02 NOTE — Progress Notes (Signed)
Subjective:  Patient ID: [name] [name], female    DOB: 1941-09-19  Age: 81 y.o. MRN: [id number]  CC: Altered Mental Status (Daughter states mom been having some increased confusion w/ hallucinating)   HPI [name] [name] presents for withdrawn, hallucinating with UTI 1 month ago. She has been on a low-dose Risperdal. Last week pt got worse, defiant, confused, psychotic. Pt wants to be in the dark room.  She is seeing drones outside, bags" is in the house.  Her Dtr is an Therapist, sports at [employer]. The patient has a history of bipolar disorder with psychotic features.  It was refractory to multiple meds.  She has a history of psychiatric hospitalization in the past. Her daughter is looking at the assisted living facilities with memory care units.  In fact, they are on the waiting list.    Outpatient Medications Prior to Visit  Medication Sig Dispense Refill   amLODipine (NORVASC) 10 MG tablet Take 1 tablet (10 mg total) by mouth daily. 90 tablet 3   Cholecalciferol ([employer] D3) 50 MCG (2000 UT) CAPS Take 1 capsule (2,000 Units total) by mouth daily. 90 capsule 3   EPINEPHrine 0.3 mg/0.3 mL IJ SOAJ injection INJECT 0.3 MLS INTO THE MUSCLE ONCE FOR 1 DOSE. 2 each 0   montelukast (SINGULAIR) 10 MG tablet Take 1 tablet (10 mg total) by mouth at bedtime. 90 tablet 1   Olopatadine HCl (PATADAY OP) Place 1 drop into both eyes daily.     pantoprazole (PROTONIX) 40 MG tablet Take 1 tablet (40 mg total) by mouth daily. 90 tablet 3   risperiDONE (RISPERDAL) 0.25 MG tablet Take 1 tablet (0.25 mg total) by mouth every 12 (twelve) hours. 60 tablet 1   SYMBICORT 160-4.5 MCG/ACT inhaler INHALE 2 PUFFS INTO THE LUNGS IN THE MORNING AND AT BEDTIME. (Patient taking differently: Inhale 2 puffs into the lungs 2 (two) times daily.) 30.6 each 3   XOPENEX 0.63 MG/3ML nebulizer solution Take 3 mLs (0.63 mg total) by nebulization every 4 (four) hours as needed for wheezing or shortness of breath. 360 mL 0   XOPENEX HFA 45 MCG/ACT inhaler  INHALE 1 TO 2 PUFFS BY MOUTH EVERY 6 HOURS AS NEEDED FOR WHEEZE (Patient taking differently: Inhale 1-2 puffs into the lungs every 6 (six) hours as needed for wheezing or shortness of breath.) 45 each 1   No facility-administered medications prior to visit.    ROS: Review of Systems  Constitutional:  Negative for activity change, appetite change, chills, fatigue and unexpected weight change.  HENT:  Negative for congestion, mouth sores and sinus pressure.   Eyes:  Positive for photophobia. Negative for visual disturbance.  Respiratory:  Negative for cough and chest tightness.   Gastrointestinal:  Negative for abdominal pain and nausea.  Genitourinary:  Positive for urgency. Negative for difficulty urinating, frequency and vaginal pain.  Musculoskeletal:  Negative for back pain and gait problem.  Skin:  Negative for pallor and rash.  Neurological:  Negative for dizziness, tremors, syncope, weakness, numbness and headaches.  Hematological:  Does not bruise/bleed easily.  Psychiatric/Behavioral:  Positive for agitation, behavioral problems, confusion, decreased concentration, dysphoric mood, hallucinations and sleep disturbance. Negative for suicidal ideas. The patient is nervous/anxious and is hyperactive.     Objective:  BP (!) 148/84 (BP Location: Left Arm)   Pulse 65   Temp 98 F (36.7 C) (Oral)   SpO2 98%   BP Readings from Last 3 Encounters:  10/02/22 (!) 148/84  09/12/22 (!) 160/80  09/04/22 (!) 161/72    Wt Readings from Last 3 Encounters:  09/12/22 146 lb (66.2 kg)  09/04/22 146 lb 2.6 oz (66.3 kg)  07/30/22 149 lb 9.6 oz (67.9 kg)    Physical Exam Constitutional:      General: She is not in acute distress.    Appearance: She is well-developed.  HENT:     Head: Normocephalic.     Right Ear: External ear normal.     Left Ear: External ear normal.     Nose: Nose normal.  Eyes:     General:        Right eye: No discharge.        Left eye: No discharge.      Conjunctiva/sclera: Conjunctivae normal.     Pupils: Pupils are equal, round, and reactive to light.  Neck:     Thyroid: No thyromegaly.     Vascular: No JVD.     Trachea: No tracheal deviation.  Cardiovascular:     Rate and Rhythm: Normal rate and regular rhythm.     Heart sounds: Normal heart sounds.  Pulmonary:     Effort: No respiratory distress.     Breath sounds: No stridor. No wheezing.  Abdominal:     General: Bowel sounds are normal. There is no distension.     Palpations: Abdomen is soft. There is no mass.     Tenderness: There is no abdominal tenderness. There is no guarding or rebound.  Musculoskeletal:        General: No tenderness.     Cervical back: Normal range of motion and neck supple. No rigidity.  Lymphadenopathy:     Cervical: No cervical adenopathy.  Skin:    Findings: No erythema or rash.  Neurological:     General: No focal deficit present.     Mental Status: Mental status is at baseline.     Cranial Nerves: No cranial nerve deficit.     Sensory: No sensory deficit.     Motor: No weakness or abnormal muscle tone.     Coordination: Coordination normal.     Gait: Gait normal.     Deep Tendon Reflexes: Reflexes normal.  Psychiatric:        Behavior: Behavior normal.        Thought Content: Thought content normal.        Judgment: Judgment normal.   The patient is overall cooperative, alert, She knows it is 2023, close to Thanksgiving day She got upset several times during the interview  Lab Results  Component Value Date   WBC 6.5 09/12/2022   HGB 11.4 (L) 09/12/2022   HCT 36.2 09/12/2022   PLT 295.0 09/12/2022   GLUCOSE 107 (H) 09/12/2022   CHOL 215 (H) 11/19/2021   TRIG 94.0 11/19/2021   HDL 51.80 11/19/2021   LDLDIRECT 123.2 11/21/2014   LDLCALC 144 (H) 11/19/2021   ALT 12 09/12/2022   AST 17 09/12/2022   NA 140 09/12/2022   K 3.9 09/12/2022   CL 106 09/12/2022   CREATININE 1.13 09/12/2022   BUN 19 09/12/2022   CO2 29 09/12/2022    TSH 0.630 08/31/2022   HGBA1C 5.8 11/19/2021    ECHOCARDIOGRAM COMPLETE  Result Date: 08/30/2022    ECHOCARDIOGRAM REPORT   Patient Name:   [name] [name] Date of Exam: 08/30/2022 Medical Rec #:  [id number]    Height:       63.0 in Accession #:    [id number]   Weight:  140.0 lb Date of Birth:  [birth year]     BSA:          1.662 m Patient Age:    80 years     BP:           216/89 mmHg Patient Gender: F            HR:           75 bpm. Exam Location:  Inpatient Procedure: 2D Echo, Color Doppler and Cardiac Doppler Indications:    elevated troponins  History:        Patient has no prior history of Echocardiogram examinations.                 COPD; Risk Factors:Dyslipidemia and Hypertension.  Sonographer:    Melissa Morford RDCS (AE, PE) Referring Phys: [id number] [place]  1. Left ventricular ejection fraction, by estimation, is 55 to 60%. The left ventricle has normal function. The left ventricle has no regional wall motion abnormalities. Left ventricular diastolic parameters are indeterminate.  2. Right ventricular systolic function is normal. The right ventricular size is normal.  3. The mitral valve is normal in structure. Trivial mitral valve regurgitation.  4. The aortic valve is tricuspid. Aortic valve regurgitation is not visualized. Aortic valve sclerosis/calcification is present, without any evidence of aortic stenosis.  5. The inferior vena cava is normal in size with greater than 50% respiratory variability, suggesting right atrial pressure of 3 mmHg. FINDINGS  Left Ventricle: Left ventricular ejection fraction, by estimation, is 55 to 60%. The left ventricle has normal function. The left ventricle has no regional wall motion abnormalities. The left ventricular internal cavity size was normal in size. There is  no left ventricular hypertrophy. Left ventricular diastolic parameters are indeterminate. Right Ventricle: The right ventricular size is normal. Right vetricular wall  thickness was not assessed. Right ventricular systolic function is normal. Left Atrium: Left atrial size was normal in size. Right Atrium: Right atrial size was normal in size. Pericardium: There is no evidence of pericardial effusion. Mitral Valve: The mitral valve is normal in structure. Trivial mitral valve regurgitation. Tricuspid Valve: The tricuspid valve is normal in structure. Tricuspid valve regurgitation is trivial. Aortic Valve: The aortic valve is tricuspid. Aortic valve regurgitation is not visualized. Aortic valve sclerosis/calcification is present, without any evidence of aortic stenosis. Pulmonic Valve: The pulmonic valve was not well visualized. Pulmonic valve regurgitation is not visualized. No evidence of pulmonic stenosis. Aorta: The aortic root is normal in size and structure. Venous: The inferior vena cava is normal in size with greater than 50% respiratory variability, suggesting right atrial pressure of 3 mmHg. IAS/Shunts: No atrial level shunt detected by color flow Doppler.  LEFT VENTRICLE PLAX 2D LVIDd:         4.40 cm   Diastology LVIDs:         3.20 cm   LV e' medial:    4.13 cm/s LV PW:         1.00 cm   LV E/e' medial:  22.0 LV IVS:        0.90 cm   LV e' lateral:   6.64 cm/s LVOT diam:     2.00 cm   LV E/e' lateral: 13.7 LV SV:         69 LV SV Index:   42 LVOT Area:     3.14 cm  RIGHT VENTRICLE RV S prime:     17.30 cm/s TAPSE (M-mode): 2.3  cm LEFT ATRIUM             Index        RIGHT ATRIUM           Index LA diam:        4.00 cm 2.41 cm/m   RA Area:     16.10 cm LA Vol (A2C):   66.9 ml 40.26 ml/m  RA Volume:   40.40 ml  24.31 ml/m LA Vol (A4C):   53.8 ml 32.38 ml/m LA Biplane Vol: 63.3 ml 38.09 ml/m  AORTIC VALVE LVOT Vmax:   109.00 cm/s LVOT Vmean:  69.800 cm/s LVOT VTI:    0.221 m  AORTA Ao Root diam: 2.70 cm MITRAL VALVE                TRICUSPID VALVE MV Area (PHT): 5.20 cm     TR Peak grad:   25.8 mmHg MV Decel Time: 146 msec     TR Vmax:        254.00 cm/s MV E  velocity: 90.70 cm/s MV A velocity: 141.00 cm/s  SHUNTS MV E/A ratio:  0.64         Systemic VTI:  0.22 m                             Systemic Diam: 2.00 cm Dorris Carnes MD Electronically signed by Dorris Carnes MD Signature Date/Time: 08/30/2022/2:52:36 PM    Final    DG Chest 2 View  Result Date: 08/30/2022 CLINICAL DATA:  Altered mental status EXAM: CHEST - 2 VIEW COMPARISON:  08/24/2015 FINDINGS: Heart is borderline in size. No confluent airspace opacities, effusions or edema. No acute bony abnormality. IMPRESSION: No active cardiopulmonary disease. Electronically Signed   By: Rolm Baptise M.D.   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  I do not see any signs of a medical illness present at the moment.  We will obtain another urine test.  Obtain head CT today.  No history of dementia. We will discontinue Risperdal.  I prescribed Zyprexa 5 mg at at bedtime.  Diazepam 5-10 mg 3 times daily as needed agitation.  We can start fluoxetine in a couple days. Risks of using Zyprexa, benzodiazepines in elderly is discussed with daughter. Go to ER if problems Follow-up with [name] in 2 weeks Psychiatry appointment is pending on December 5.       Bipolar disorder with psychotic features ([place])    Recurrent psychosis, likely related to bipolar disorder.  I do not see any signs of a medical illness present at the moment.  We will obtain another urine test.  Obtain head CT today.  No history of dementia. We will discontinue Risperdal.  I prescribed Zyprexa 5 mg at at bedtime.  Diazepam 5-10 mg 3 times daily as needed agitation.  We can start fluoxetine in a couple days. Risks of using Zyprexa, benzodiazepines in elderly is discussed with daughter. Go to ER if problems Follow-up with [name] in 2 weeks Psychiatry appointment is  pending on December 5.      Other Visit Diagnoses     Confusion    -  Primary   Relevant Orders   Urinalysis   CT  HEAD WO CONTRAST (5MM)         Meds ordered this encounter  Medications   OLANZapine (ZYPREXA) 5 MG tablet    Sig: Take 1 tablet (5 mg total) by mouth at bedtime.    Dispense:  30 tablet    Refill:  3   FLUoxetine (PROZAC) 10 MG tablet    Sig: Take 1 tablet (10 mg total) by mouth daily.    Dispense:  30 tablet    Refill:  3   diazepam (VALIUM) 5 MG tablet    Sig: Take 1-2 tablets (5-10 mg total) by mouth every 8 (eight) hours as needed for anxiety (agitation).    Dispense:  90 tablet    Refill:  1      Follow-up: Return in about 2 weeks (around 10/16/2022) for a follow-up visit.  Walker Kehr, MD

## 2022-10-02 NOTE — Assessment & Plan Note (Signed)
Obtain UA

## 2022-10-15 ENCOUNTER — Encounter: Payer: Medicare Other | Admitting: Internal Medicine

## 2022-10-15 NOTE — Patient Instructions (Signed)
ICD-10-CM   1. Persistent asthma with acute exacerbation, unspecified asthma severity  J45.901     2. Severe persistent asthma without complication  V67.01 POCT EXHALED NITRIC OXIDE    3. Eosinophilia, unspecified type  D72.10        In asthma flare  Plan  - check cbc with diff  - start FASENRA - continue symbicort schedule and singulair scheduled wth albuterol as needed - Take prednisone 40 mg daily x 2 days, then '20mg'$  daily x 2 days, then '10mg'$  daily x 2 days, then '5mg'$  daily x 2 days and stop   Follow-up -10-12 weeks or sooner with [name]  - ACT test and feno at followup  - face to face

## 2022-10-15 NOTE — Progress Notes (Signed)
 This encounter was created in error - please disregard.

## 2022-10-16 ENCOUNTER — Other Ambulatory Visit: Payer: Self-pay | Admitting: Internal Medicine

## 2022-10-21 ENCOUNTER — Ambulatory Visit (HOSPITAL_BASED_OUTPATIENT_CLINIC_OR_DEPARTMENT_OTHER): Payer: Medicare Other | Admitting: Student in an Organized Health Care Education/Training Program

## 2022-10-21 ENCOUNTER — Ambulatory Visit (HOSPITAL_COMMUNITY): Payer: Medicare Other | Admitting: Student in an Organized Health Care Education/Training Program

## 2022-10-21 ENCOUNTER — Encounter (HOSPITAL_COMMUNITY): Payer: Self-pay | Admitting: Student in an Organized Health Care Education/Training Program

## 2022-10-21 VITALS — BP 200/80 | HR 53 | Resp 12

## 2022-10-21 DIAGNOSIS — F319 Bipolar disorder, unspecified: Secondary | ICD-10-CM | POA: Diagnosis not present

## 2022-10-21 MED ORDER — OLANZAPINE 10 MG PO TABS: 10.0000 mg | ORAL_TABLET | Freq: Every day | ORAL | 2 refills | Status: DC
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
## 2022-10-29 NOTE — Progress Notes (Signed)
In error

## 2022-11-20 ENCOUNTER — Telehealth: Payer: Self-pay | Admitting: Internal Medicine

## 2022-11-20 NOTE — Telephone Encounter (Signed)
Sharyn Lull from [place] [facility] needs someone to call her on a renewal of patient's incontinence supplies - Her script will expire 1/182024/  Please call - [phone]

## 2022-11-21 NOTE — Telephone Encounter (Signed)
Paperwork filled out

## 2022-11-21 NOTE — Telephone Encounter (Signed)
Spoke with representative and informed her that this has been faxed back over

## 2022-11-27 NOTE — Telephone Encounter (Signed)
Forms have been faxed again.  

## 2022-12-11 ENCOUNTER — Ambulatory Visit (INDEPENDENT_AMBULATORY_CARE_PROVIDER_SITE_OTHER): Payer: Medicare Other

## 2022-12-11 VITALS — Ht 63.0 in | Wt 146.0 lb

## 2022-12-11 DIAGNOSIS — Z0000 Encounter for general adult medical examination without abnormal findings: Secondary | ICD-10-CM | POA: Diagnosis not present

## 2022-12-11 NOTE — Patient Instructions (Signed)
[name] [name] , Thank you for taking time to come for your Medicare Wellness Visit. I appreciate your ongoing commitment to your health goals. Please review the following plan we discussed and let me know if I can assist you in the future.   These are the goals we discussed:  Goals      Client and daughter understands the importance of follow-up with providers by attending scheduled visits.        This is a list of the screening recommended for you and due dates:  Health Maintenance  Topic Date Due   Zoster (Shingles) Vaccine (1 of 2) Never done   DTaP/Tdap/Td vaccine (2 - Tdap) 01/10/2015   Medicare Annual Wellness Visit  12/12/2023   Pneumonia Vaccine  Completed   DEXA scan (bone density measurement)  Completed   Hepatitis C Screening: USPSTF Recommendation to screen - Ages 22-79 yo.  Completed   HPV Vaccine  Aged Out   COVID-19 Vaccine  Discontinued    Advanced directives: Yes  Conditions/risks identified: Yes  Next appointment: Follow up in one year for your annual wellness visit.   Preventive Care 23 Years and Older, Female Preventive care refers to lifestyle choices and visits with your health care provider that can promote health and wellness. What does preventive care include? A yearly physical exam. This is also called an annual well check. Dental exams once or twice a year. Routine eye exams. Ask your health care provider how often you should have your eyes checked. Personal lifestyle choices, including: Daily care of your teeth and gums. Regular physical activity. Eating a healthy diet. Avoiding tobacco and drug use. Limiting alcohol use. Practicing safe sex. Taking low-dose aspirin every day. Taking vitamin and mineral supplements as recommended by your health care provider. What happens during an annual well check? The services and screenings done by your health care provider during your annual well check will depend on your age, overall health, lifestyle risk  factors, and family history of disease. Counseling  Your health care provider may ask you questions about your: Alcohol use. Tobacco use. Drug use. Emotional well-being. Home and relationship well-being. Sexual activity. Eating habits. History of falls. Memory and ability to understand (cognition). Work and work Statistician. Reproductive health. Screening  You may have the following tests or measurements: Height, weight, and BMI. Blood pressure. Lipid and cholesterol levels. These may be checked every 5 years, or more frequently if you are over 22 years old. Skin check. Lung cancer screening. You may have this screening every year starting at age 75 if you have a 30-pack-year history of smoking and currently smoke or have quit within the past 15 years. Fecal occult blood test (FOBT) of the stool. You may have this test every year starting at age 73. Flexible sigmoidoscopy or colonoscopy. You may have a sigmoidoscopy every 5 years or a colonoscopy every 10 years starting at age 47. Hepatitis C blood test. Hepatitis B blood test. Sexually transmitted disease (STD) testing. Diabetes screening. This is done by checking your blood sugar (glucose) after you have not eaten for a while (fasting). You may have this done every 1-3 years. Bone density scan. This is done to screen for osteoporosis. You may have this done starting at age 46. Mammogram. This may be done every 1-2 years. Talk to your health care provider about how often you should have regular mammograms. Talk with your health care provider about your test results, treatment options, and if necessary, the need for more  tests. Vaccines  Your health care provider may recommend certain vaccines, such as: Influenza vaccine. This is recommended every year. Tetanus, diphtheria, and acellular pertussis (Tdap, Td) vaccine. You may need a Td booster every 10 years. Zoster vaccine. You may need this after age 38. Pneumococcal 13-valent  conjugate (PCV13) vaccine. One dose is recommended after age 34. Pneumococcal polysaccharide (PPSV23) vaccine. One dose is recommended after age 57. Talk to your health care provider about which screenings and vaccines you need and how often you need them. This information is not intended to replace advice given to you by your health care provider. Make sure you discuss any questions you have with your health care provider. Document Released: 11/24/2015 Document Revised: 07/17/2016 Document Reviewed: 08/29/2015 Elsevier Interactive Patient Education  2017 [employer] Prevention in the Home Falls can cause injuries. They can happen to people of all ages. There are many things you can do to make your home safe and to help prevent falls. What can I do on the outside of my home? Regularly fix the edges of walkways and driveways and fix any cracks. Remove anything that might make you trip as you walk through a door, such as a raised step or threshold. Trim any bushes or trees on the path to your home. Use bright outdoor lighting. Clear any walking paths of anything that might make someone trip, such as rocks or tools. Regularly check to see if handrails are loose or broken. Make sure that both sides of any steps have handrails. Any raised decks and porches should have guardrails on the edges. Have any leaves, snow, or ice cleared regularly. Use sand or salt on walking paths during winter. Clean up any spills in your garage right away. This includes oil or grease spills. What can I do in the bathroom? Use night lights. Install grab bars by the toilet and in the tub and shower. Do not use towel bars as grab bars. Use non-skid mats or decals in the tub or shower. If you need to sit down in the shower, use a plastic, non-slip stool. Keep the floor dry. Clean up any water that spills on the floor as soon as it happens. Remove soap buildup in the tub or shower regularly. Attach bath mats  securely with double-sided non-slip rug tape. Do not have throw rugs and other things on the floor that can make you trip. What can I do in the bedroom? Use night lights. Make sure that you have a light by your bed that is easy to reach. Do not use any sheets or blankets that are too big for your bed. They should not hang down onto the floor. Have a firm chair that has side arms. You can use this for support while you get dressed. Do not have throw rugs and other things on the floor that can make you trip. What can I do in the kitchen? Clean up any spills right away. Avoid walking on wet floors. Keep items that you use a lot in easy-to-reach places. If you need to reach something above you, use a strong step stool that has a grab bar. Keep electrical cords out of the way. Do not use floor polish or wax that makes floors slippery. If you must use wax, use non-skid floor wax. Do not have throw rugs and other things on the floor that can make you trip. What can I do with my stairs? Do not leave any items on the stairs. Make sure  that there are handrails on both sides of the stairs and use them. Fix handrails that are broken or loose. Make sure that handrails are as long as the stairways. Check any carpeting to make sure that it is firmly attached to the stairs. Fix any carpet that is loose or worn. Avoid having throw rugs at the top or bottom of the stairs. If you do have throw rugs, attach them to the floor with carpet tape. Make sure that you have a light switch at the top of the stairs and the bottom of the stairs. If you do not have them, ask someone to add them for you. What else can I do to help prevent falls? Wear shoes that: Do not have high heels. Have rubber bottoms. Are comfortable and fit you well. Are closed at the toe. Do not wear sandals. If you use a stepladder: Make sure that it is fully opened. Do not climb a closed stepladder. Make sure that both sides of the stepladder  are locked into place. Ask someone to hold it for you, if possible. Clearly mark and make sure that you can see: Any grab bars or handrails. First and last steps. Where the edge of each step is. Use tools that help you move around (mobility aids) if they are needed. These include: Canes. Walkers. Scooters. Crutches. Turn on the lights when you go into a dark area. Replace any light bulbs as soon as they burn out. Set up your furniture so you have a clear path. Avoid moving your furniture around. If any of your floors are uneven, fix them. If there are any pets around you, be aware of where they are. Review your medicines with your doctor. Some medicines can make you feel dizzy. This can increase your chance of falling. Ask your doctor what other things that you can do to help prevent falls. This information is not intended to replace advice given to you by your health care provider. Make sure you discuss any questions you have with your health care provider. Document Released: 08/24/2009 Document Revised: 04/04/2016 Document Reviewed: 12/02/2014 Elsevier Interactive Patient Education  2017 [employer].
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
## 2022-12-16 ENCOUNTER — Ambulatory Visit (HOSPITAL_BASED_OUTPATIENT_CLINIC_OR_DEPARTMENT_OTHER): Payer: Medicare Other | Admitting: Student in an Organized Health Care Education/Training Program

## 2022-12-16 ENCOUNTER — Encounter (HOSPITAL_COMMUNITY): Payer: Self-pay | Admitting: Student in an Organized Health Care Education/Training Program

## 2022-12-16 DIAGNOSIS — F319 Bipolar disorder, unspecified: Secondary | ICD-10-CM

## 2022-12-16 MED ORDER — DIVALPROEX SODIUM ER 250 MG PO TB24: 250.0000 mg | ORAL_TABLET | Freq: Every day | ORAL | 2 refills | Status: DC

## 2022-12-16 MED ORDER — OLANZAPINE 10 MG PO TABS: 10.0000 mg | ORAL_TABLET | Freq: Every day | ORAL | 2 refills | Status: DC
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
## 2023-01-07 ENCOUNTER — Other Ambulatory Visit (HOSPITAL_COMMUNITY): Payer: Self-pay

## 2023-01-22 ENCOUNTER — Ambulatory Visit (HOSPITAL_COMMUNITY): Payer: Medicaid Other | Admitting: Student in an Organized Health Care Education/Training Program

## 2023-02-05 ENCOUNTER — Ambulatory Visit (HOSPITAL_COMMUNITY): Payer: Medicare Other | Admitting: Student in an Organized Health Care Education/Training Program

## 2023-02-05 ENCOUNTER — Encounter (HOSPITAL_COMMUNITY): Payer: Self-pay | Admitting: Student in an Organized Health Care Education/Training Program

## 2023-02-05 VITALS — BP 181/95 | HR 76 | Resp 20 | Wt 131.0 lb

## 2023-02-05 DIAGNOSIS — F319 Bipolar disorder, unspecified: Secondary | ICD-10-CM

## 2023-02-05 MED ORDER — DIVALPROEX SODIUM ER 250 MG PO TB24: 250.0000 mg | ORAL_TABLET | Freq: Every day | ORAL | 2 refills | Status: DC

## 2023-02-05 MED ORDER — OLANZAPINE 7.5 MG PO TABS: 7.5000 mg | ORAL_TABLET | Freq: Every day | ORAL | 2 refills | Status: DC

## 2023-02-05 NOTE — Addendum Note (Signed)
Addended by: Charlette Caffey on: 02/05/2023 05:00 PM   Modules accepted: Orders

## 2023-02-05 NOTE — Patient Instructions (Addendum)
  When you see PCP Discuss with PCP that she is not having sensation of hunger. She has been losing a lot of weight since 2/5/024 she has lost approx 10 lbs and since 11/2022, 15 lbs.  - Please reach out to PCP - Also recommend a referral to Occupational Therapy  Medication Start taking Zyprexa 7.5mg  nightly, this is sedating so it should be at night. We decreased the dose due to endorsing oversedation and stability with hallucinations. Continue Depakote ER 250mg  nightly
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
## 2023-02-05 NOTE — Addendum Note (Signed)
Addended by: Damita Dunnings B on: 02/05/2023 04:29 PM   Modules accepted: Level of Service

## 2023-02-20 ENCOUNTER — Ambulatory Visit (INDEPENDENT_AMBULATORY_CARE_PROVIDER_SITE_OTHER): Payer: Medicare Other | Admitting: Internal Medicine

## 2023-02-20 ENCOUNTER — Telehealth: Payer: Self-pay | Admitting: *Deleted

## 2023-02-20 ENCOUNTER — Encounter: Payer: Self-pay | Admitting: Internal Medicine

## 2023-02-20 ENCOUNTER — Other Ambulatory Visit: Payer: Self-pay | Admitting: Internal Medicine

## 2023-02-20 VITALS — BP 160/100 | HR 81 | Temp 98.5°F | Wt 131.0 lb

## 2023-02-20 DIAGNOSIS — J4550 Severe persistent asthma, uncomplicated: Secondary | ICD-10-CM

## 2023-02-20 DIAGNOSIS — I10 Essential (primary) hypertension: Secondary | ICD-10-CM | POA: Diagnosis not present

## 2023-02-20 MED ORDER — BUDESONIDE-FORMOTEROL FUMARATE 160-4.5 MCG/ACT IN AERO: 2.0000 | INHALATION_SPRAY | Freq: Two times a day (BID) | RESPIRATORY_TRACT | 3 refills | Status: DC

## 2023-02-20 MED ORDER — XOPENEX 0.63 MG/3ML IN NEBU: 0.6300 mg | INHALATION_SOLUTION | RESPIRATORY_TRACT | 0 refills | Status: DC | PRN

## 2023-02-20 NOTE — Patient Instructions (Addendum)
We will get you in with pulmonary doctor.   Try using the xopenex 2 times a day on a schedule.

## 2023-02-20 NOTE — Progress Notes (Signed)
   Subjective:   Patient ID: [name] [name], female    DOB: 1941-02-01, 82 y.o.   MRN: [id number]  HPI The patient is an 82 YO female coming in for BP follow up. Having more breathing issues and needs to get back in to pulmonary out of xopenex nebulizer solution.   Review of Systems  Constitutional: Negative.   HENT: Negative.    Eyes: Negative.   Respiratory:  Positive for cough, shortness of breath and wheezing. Negative for chest tightness.   Cardiovascular:  Negative for chest pain, palpitations and leg swelling.  Gastrointestinal:  Negative for abdominal distention, abdominal pain, constipation, diarrhea, nausea and vomiting.  Musculoskeletal: Negative.   Skin: Negative.   Neurological: Negative.   Psychiatric/Behavioral: Negative.      Objective:  Physical Exam Constitutional:      Appearance: She is well-developed.  HENT:     Head: Normocephalic and atraumatic.  Cardiovascular:     Rate and Rhythm: Normal rate and regular rhythm.  Pulmonary:     Effort: Pulmonary effort is normal. No respiratory distress.     Breath sounds: Wheezing present. No rales.  Abdominal:     General: Bowel sounds are normal. There is no distension.     Palpations: Abdomen is soft.     Tenderness: There is no abdominal tenderness. There is no rebound.  Musculoskeletal:     Cervical back: Normal range of motion.  Skin:    General: Skin is warm and dry.  Neurological:     Mental Status: She is alert and oriented to person, place, and time.     Coordination: Coordination normal.     Vitals:   02/20/23 1108 02/20/23 1111  BP: (!) 160/100 (!) 160/100  Pulse: 81   Temp: 98.5 F (36.9 C)   TempSrc: Oral   SpO2: 95%   Weight: 131 lb (59.4 kg)     Assessment & Plan:

## 2023-02-20 NOTE — Progress Notes (Signed)
  Care Coordination   Note   02/20/2023 Name: [name] [name] MRN: [id number] DOB: [birth year]  [name] [name] is a 82 y.o. year old female who sees Myrlene Broker, MD for primary care. I reached out to Peggye Fothergill by phone today to offer care coordination services.  [name] was given information about Care Coordination services today including:   The Care Coordination services include support from the care team which includes your Nurse Coordinator, Clinical Social Worker, or Pharmacist.  The Care Coordination team is here to help remove barriers to the health concerns and goals most important to you. Care Coordination services are voluntary, and the patient may decline or stop services at any time by request to their care team member.   Care Coordination Consent Status: Patient agreed to services and verbal consent obtained.   Follow up plan:  Telephone appointment with care coordination team member scheduled for:  02/21/2023  Encounter Outcome:  Pt. Scheduled from referral   Burman Nieves, [facility] Care Coordination Care Guide Direct Dial: [phone]

## 2023-02-21 ENCOUNTER — Ambulatory Visit: Payer: Self-pay | Admitting: Licensed Clinical Social Worker

## 2023-02-21 ENCOUNTER — Other Ambulatory Visit: Payer: Self-pay | Admitting: Internal Medicine

## 2023-02-21 NOTE — Telephone Encounter (Signed)
Alternative Requested:NOT COVDERED BY ISNRUANCE.

## 2023-02-21 NOTE — Assessment & Plan Note (Signed)
Rx xopenex nebulizer solution and advised to take this scheduled BID and see pulmonary in follow up. Mild wheezing on exam consistent with her baseline. She is using symbicort but technique is not great. Pulmonary may need to consider switching to all nebulized medications to help her symptoms.

## 2023-02-21 NOTE — Patient Instructions (Signed)
Visit Information  Thank you for taking time to visit with me today. Please don't hesitate to contact me if I can be of assistance to you.   Following are the goals we discussed today:   Goals Addressed             This Visit's Progress    Facility Placement for ALF       Activities and task to complete in order to accomplish goals.  Dau. Will assist We discussed the PACE program and this is not a good for your needs a this time Review facilities from the list provided call and go visit ( Please get contact person's name, phone number & fax number so I can collaborate with them) Review booklet ''When a loved one need long-term care"  I will work with Dr. Okey Dupre to get a new FL2 when needed.       Please call the care guide team at [phone] if you need to cancel or reschedule your appointment.   If you are experiencing a Mental Health or Behavioral Health Crisis or need someone to talk to, please call 1-800-273-TALK (toll free, 24 hour hotline)   Patient verbalizes understanding of instructions and care plan provided today and agrees to view in MyChart. Active MyChart status and patient understanding of how to access instructions and care plan via MyChart confirmed with patient.     Daughter states will call Child psychotherapist within one week  Sammuel Hines, [employer] Social Work Care Coordination  [employer] [name] [phone]

## 2023-02-21 NOTE — Assessment & Plan Note (Signed)
BP running normal at home. Initially elevated. Taking amlodipine 10 mg daily which we will continue. Family is struggling to find an ALF for her due to financial constraints. Referral for social worker to help them with this process.

## 2023-02-21 NOTE — Patient Outreach (Signed)
  Care Coordination  Initial Visit Note   02/21/2023 Name: [name] [name] MRN: [id number] DOB: 19-Feb-1941  [name] [name] is a 82 y.o. year old female who sees Myrlene Broker, MD for primary care. I spoke with  [name]'s daughter [name] [phone] by phone today.  What matters to the patients health and wellness today?  Getting patient placed in ALF facility  Daughter reports patient's health continues to decline. She believes patient would benefit from being around other people her age.  Daughter has been looking at facilities for several months and continues to experience barriers with locating availability at an ALF that will take Medicaid.  Discussed other options of possible skilled if patient meeting criteria.  List of skilled facilities with ALF e-mailed.   Goals Addressed             This Visit's Progress    Facility Placement for ALF       Activities and task to complete in order to accomplish goals.  Dau. Will assist We discussed the PACE program and this is not a good for your needs a this time Review facilities from the list provided call and go visit ( Please get contact person's name, phone number & fax number so I can collaborate with them) Review booklet ''When a loved one need long-term care"  I will work with Dr. Okey Dupre to get a new FL2 when needed.       SDOH assessments and interventions completed:  No   Care Coordination Interventions:  Yes, provided  Interventions Today    Flowsheet Row Most Recent Value  Chronic Disease   Chronic disease during today's visit Hypertension (HTN), Chronic Obstructive Pulmonary Disease (COPD), Chronic Kidney Disease/End Stage Renal Disease (ESRD)  General Interventions   General Interventions Discussed/Reviewed [facility], Level of Care  Level of Care Skilled Nursing Facility, Assisted Living  Education Interventions   Education Provided Provided Education, Provided Web-based Education  Provided Verbal  Education On Other  [list of facilities,  reviewed PACE program, placement process, Discussed FL2,]       Follow up plan:  No follow up scheduled at this time dau. States she will call LCSW.  If no call received in 1 week LCSW will contact daughter for an update on her progress.    Encounter Outcome:  Pt. Visit Completed   Sammuel Hines, LCSW Social Work Care Coordination  [name] [phone]

## 2023-02-21 NOTE — Telephone Encounter (Signed)
Med not cover under plan. Alternatives albuterol (ACCUNEB) 0.63 MG/3ML nebulizer solution, or  albuterol (VENTOLIN HFA) 108 (90 Base) MCG/ACT inhaler

## 2023-03-05 ENCOUNTER — Encounter (HOSPITAL_COMMUNITY): Payer: Self-pay | Admitting: Student in an Organized Health Care Education/Training Program

## 2023-03-05 ENCOUNTER — Ambulatory Visit (HOSPITAL_BASED_OUTPATIENT_CLINIC_OR_DEPARTMENT_OTHER): Payer: Medicare Other | Admitting: Student in an Organized Health Care Education/Training Program

## 2023-03-05 DIAGNOSIS — F319 Bipolar disorder, unspecified: Secondary | ICD-10-CM

## 2023-03-05 MED ORDER — DIVALPROEX SODIUM ER 250 MG PO TB24: 250.0000 mg | ORAL_TABLET | Freq: Every day | ORAL | 2 refills | Status: DC

## 2023-03-05 MED ORDER — OLANZAPINE 7.5 MG PO TABS: 7.5000 mg | ORAL_TABLET | Freq: Every day | ORAL | 2 refills | Status: DC
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
## 2023-03-12 ENCOUNTER — Telehealth: Payer: Self-pay | Admitting: Licensed Clinical Social Worker

## 2023-03-12 NOTE — Patient Instructions (Signed)
   Care Coordination provides support specific to your health needs that extend beyond exceptional routine office care you already receive from your primary care doctor.    If you are eligible for standard Care Coordination, there is no cost to you.  The Care Coordination team is made up of the following team members: Registered Nurse Care Guide: disease management, health education, care coordination and complex case management Clinical Social Work: Complex Care Coordination including coordination of level of care needs, mental and behavioral health assessment and recommendations, and connection to long-term mental health support Clinical Pharmacist: medication management, assistance and disease management Community Resource Care Guides: Armed forces operational officer Team: dedicated team of scheduling professionals to support patient and clinical team scheduling needs  Please call [phone] if you would like to schedule a phone appointment with one of the team members.    Sammuel Hines, LCSW Social Work Care Coordination  [phone]

## 2023-03-12 NOTE — Patient Outreach (Signed)
  Care Coordination   03/12/2023 Name: [name] [name] MRN: [id number] DOB: 30-Jul-1941   Care Coordination Outreach Attempts:  An unsuccessful telephone outreach was attempted today to offer the patient information about available care coordination services. Previous encounter  daughter indicated would call LCSW if needed.  F/u call to see if additional support is needed.  Left voice message to call if support is needed  Follow Up Plan:  No further outreach attempts will be made at this time. We have been unable to contact the patient to offer or enroll patient in care coordination services  Encounter Outcome:  No Answer   Care Coordination Interventions:  No, not indicated    Sammuel Hines, LCSW Social Work Care Coordination  [facility] [name] [phone]

## 2023-03-15 ENCOUNTER — Other Ambulatory Visit: Payer: Self-pay | Admitting: Internal Medicine

## 2023-03-16 ENCOUNTER — Other Ambulatory Visit: Payer: Self-pay | Admitting: Internal Medicine

## 2023-03-19 ENCOUNTER — Encounter: Payer: Self-pay | Admitting: Internal Medicine

## 2023-03-19 ENCOUNTER — Ambulatory Visit (INDEPENDENT_AMBULATORY_CARE_PROVIDER_SITE_OTHER): Payer: Medicare Other | Admitting: Internal Medicine

## 2023-03-19 VITALS — BP 150/100 | HR 77 | Temp 98.4°F | Ht 64.0 in | Wt 126.8 lb

## 2023-03-19 DIAGNOSIS — Z5989 Other problems related to housing and economic circumstances: Secondary | ICD-10-CM

## 2023-03-19 DIAGNOSIS — J45901 Unspecified asthma with (acute) exacerbation: Secondary | ICD-10-CM

## 2023-03-19 DIAGNOSIS — J4550 Severe persistent asthma, uncomplicated: Secondary | ICD-10-CM | POA: Diagnosis not present

## 2023-03-19 DIAGNOSIS — D721 Eosinophilia, unspecified: Secondary | ICD-10-CM | POA: Diagnosis not present

## 2023-03-19 MED ORDER — MONTELUKAST SODIUM 10 MG PO TABS: 10.0000 mg | ORAL_TABLET | Freq: Every day | ORAL | 1 refills | Status: DC

## 2023-03-19 MED ORDER — PREDNISONE 10 MG PO TABS: ORAL_TABLET | ORAL | 0 refills | Status: AC

## 2023-03-19 NOTE — Progress Notes (Signed)
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 Severe persistent asthma without complication  J45.50     3. Eosinophilia, unspecified type  D72.10     4. Insurance coverage problems  Z59.89          Plan:     Patient Instructions     ICD-10-CM   1. Persistent asthma with acute exacerbation, unspecified asthma severity  J45.901     2. Severe persistent asthma without complication  J45.50     3. Eosinophilia, unspecified type  D72.10     4. Insurance coverage problems  Z59.89         In asthma flare due to poor intake with symbicort  Plan - continue symbicort schedule and singulair scheduled wth albuterol as needed  - CMA to refill these 03/19/2023  - if you find these expensive please  cal [place] -Take prednisone 40 mg daily x 2 days, then 20mg  daily x 2 days, then 10mg  daily x 2 days, then 5mg  daily x 2 days and stop    Follow-up -6 momtnths sooner with Dr. Marchelle Gearing  - ACT test and feno at followup  - face to face    SIGNATURE    Dr. Kalman Shan, M.D., F.C.C.P,  Pulmonary and Critical Care Medicine Staff  Physician, [facility] Center Director - Interstitial Lung Disease  Program  Pulmonary Fibrosis [facility] at [facility] [place], [place], [address]  Pager: [phone], If no answer or between  15:00h - 7:00h: call [phone] Telephone: [phone]  2:12 PM 03/19/2023

## 2023-03-19 NOTE — Patient Instructions (Addendum)
ICD-10-CM   1. Persistent asthma with acute exacerbation, unspecified asthma severity  J45.901     2. Severe persistent asthma without complication  J45.50     3. Eosinophilia, unspecified type  D72.10     4. Insurance coverage problems  Z59.89         In asthma flare due to poor intake with symbicort  Plan - continue symbicort schedule and singulair scheduled wth albuterol as needed  - CMA to refill these 03/19/2023  - if you find these expensive please cal [place] -Take prednisone 40 mg daily x 2 days, then 20mg  daily x 2 days, then 10mg  daily x 2 days, then 5mg  daily x 2 days and stop    Follow-up -6 momtnths sooner with Dr. Marchelle Gearing  - ACT test and feno at followup  - face to face

## 2023-03-19 NOTE — Addendum Note (Signed)
Addended by: Delrae Rend on: 03/19/2023 02:33 PM   Modules accepted: Orders

## 2023-03-20 ENCOUNTER — Telehealth: Payer: Self-pay | Admitting: *Deleted

## 2023-03-20 NOTE — Telephone Encounter (Signed)
Patient states that her Symbicort is no longer covered by her insurance and she cannot afford it.  Can you please do a test claim to see what is covered?  Additionally, she is using Xopenex instead of Albuterol because the Albuterol gives her severe nervousness and jitteriness.  Insurance will also not cover this.  Please see what is needed to get this covered as well.  Thank you.

## 2023-03-21 ENCOUNTER — Other Ambulatory Visit (HOSPITAL_COMMUNITY): Payer: Self-pay

## 2023-03-21 NOTE — Telephone Encounter (Signed)
Test results for alternatives in same class as Symbicort are as follows for a 30 day supply:   Dulera $4.60 Breo $4.60 Advair HFA $4.60  For Xopenex HFA, if this is run through as generic then it will go through fine with a $4.60 co-pay

## 2023-03-24 NOTE — Telephone Encounter (Signed)
Ok for patient to get xopenex  She is allergy ic dulera. 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

## 2023-03-25 ENCOUNTER — Other Ambulatory Visit: Payer: Self-pay | Admitting: Internal Medicine

## 2023-03-25 MED ORDER — FLUTICASONE-SALMETEROL 115-21 MCG/ACT IN AERO: 2.0000 | INHALATION_SPRAY | Freq: Two times a day (BID) | RESPIRATORY_TRACT | 12 refills | Status: DC

## 2023-03-25 NOTE — Addendum Note (Signed)
Addended by: Wyvonne Lenz on: 03/25/2023 03:36 PM   Modules accepted: Orders

## 2023-03-25 NOTE — Telephone Encounter (Signed)
Pt's daughter called the office about pt's inhalers. Told her that Rx for Advair HFA was going to replace Symbicort which is in the same class as the Symbicort and is preferred alternative with pt's insurance. Told her that pt should be able to get Xopenex HFA if they ran it through as generic.  [name] wanted to know about the xopenex nebulizer solution which pt is also needing.  Routing to prior auth team to check about the xopenex nebulizer solution.

## 2023-03-25 NOTE — Telephone Encounter (Signed)
ATC pt w/ MR message LVM for her to call office back

## 2023-03-26 ENCOUNTER — Other Ambulatory Visit (HOSPITAL_COMMUNITY): Payer: Self-pay

## 2023-03-26 NOTE — Telephone Encounter (Signed)
Nebulizer solutions to be covered under Medicare B. If PA still needed providers office will have to complete.

## 2023-03-26 NOTE — Telephone Encounter (Signed)
Changes Requested   ADVAIR HFA 45-21 MCG/ACT inhaler       Changed from: fluticasone-salmeterol (ADVAIR HFA) 115-21 MCG/ACT inhaler   All pharmacy suggested alternatives are listed below    Possible duplicate: Hover to review recent actions on this medication   Sig: N/A   Disp: Not specified    Refills: 0   Start: 03/25/2023   Class: Normal   Non-formulary   Last ordered: Yesterday (03/25/2023) by Kalman Shan, MD   Last refill: 03/25/2023   Rx #: [id number]   Pharmacy comment: Alternative Requested:THE PRESCRIBED MEDICATION IS NOT COVERED BY INSURANCE. PLEASE CONSIDER CHANGING TO ONE OF THE SUGGESTED COVERED ALTERNATIVES.  All Pharmacy Suggested Alternatives:  fluticasone-salmeterol (ADVAIR HFA) 45-21 MCG/ACT inhaler Mometasone Furo-Formoterol Fum (DULERA) 50-5 MCG/ACT AERO  To prescribe one of the alternatives listed above, open the encounter and click Replace.  Open Encounter     [id number] [place] [place] - [address]     Routing to Dr. Marchelle Gearing for review. Please advise.

## 2023-04-16 ENCOUNTER — Ambulatory Visit (HOSPITAL_COMMUNITY): Payer: Medicaid Other | Admitting: Student in an Organized Health Care Education/Training Program
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
## 2023-04-17 ENCOUNTER — Telehealth: Payer: Self-pay | Admitting: Internal Medicine

## 2023-04-18 ENCOUNTER — Other Ambulatory Visit (HOSPITAL_COMMUNITY): Payer: Self-pay

## 2023-04-18 NOTE — Telephone Encounter (Signed)
Alternatives in the same class have test claim results as follows  Dulera $4.60 Breo $4.60 Advair HFA (Brand) $4.60   All others in ICS+LABA class are non-formulary/require PA

## 2023-04-21 ENCOUNTER — Encounter: Payer: Self-pay | Admitting: Internal Medicine

## 2023-04-21 NOTE — Telephone Encounter (Signed)
Dr. Marchelle Gearing please advise? Insurance will no longer cover Symbicort. Posted below from PA is preferred alternatives

## 2023-04-21 NOTE — Telephone Encounter (Signed)
Pt daughter calling in bc pt medicine is not being approved by her ins [facility] [address]

## 2023-04-21 NOTE — Telephone Encounter (Signed)
See phone note dated 04/17/23

## 2023-04-22 NOTE — Telephone Encounter (Signed)
Please have her do Dulera 100/4.52 puffs twice daily

## 2023-04-23 NOTE — Telephone Encounter (Signed)
Patient sent MyChart message to front staff regarding needing her medication. Please send prescription to preferred pharmacy ASAP.

## 2023-04-24 MED ORDER — DULERA 100-5 MCG/ACT IN AERO: 2.0000 | INHALATION_SPRAY | Freq: Two times a day (BID) | RESPIRATORY_TRACT | 11 refills | Status: DC

## 2023-04-24 NOTE — Telephone Encounter (Signed)
Rx for [name] [name] was sent  I called the pt and left her a detailed msg  Asked that she call back with any questions or concerns

## 2023-04-28 ENCOUNTER — Telehealth: Payer: Self-pay | Admitting: Internal Medicine

## 2023-04-28 NOTE — Telephone Encounter (Signed)
Ok guys what do you want me to do?

## 2023-04-28 NOTE — Telephone Encounter (Signed)
I came back on the line and let  PT know we would call her. She yelled that  wants me to let Dr. Elvera Lennox know she is going crazy. She is 82 YO and "A'int got time for this.." She has 2 puffs left and once that is gone she has to go to ER, she said. Adv. I would let him know.

## 2023-04-28 NOTE — Telephone Encounter (Signed)
I see that Verlon Au said we called in [place].  PT states the [facility] we called in is a generic for Symbicort and it is over 200.00. Again, please call to advise.

## 2023-04-28 NOTE — Telephone Encounter (Signed)
I called the pt and there was no answer- LMTCB. ?

## 2023-04-28 NOTE — Telephone Encounter (Signed)
Please see last signed encounter. PT still waiting for an answer since 6/10. Please call to advise @ [phone]

## 2023-04-29 NOTE — Telephone Encounter (Signed)
Called and spoke with the pharmacist. The [facility] they had selected had been discontinued. She was able to get a different NDC number and the RX was able to go through with a price of $4.60. Pharmacy will go ahead and fill RX for patient.   Called patient but she did not answer. Left message for her to call us back.

## 2023-04-29 NOTE — Telephone Encounter (Signed)
Informed [name] daughter that RX was called into pharmacy. [name] phone number is [phone].

## 2023-07-01 ENCOUNTER — Other Ambulatory Visit (HOSPITAL_COMMUNITY): Payer: Self-pay

## 2023-07-01 DIAGNOSIS — F319 Bipolar disorder, unspecified: Secondary | ICD-10-CM

## 2023-07-01 MED ORDER — OLANZAPINE 7.5 MG PO TABS
7.5000 mg | ORAL_TABLET | Freq: Every day | ORAL | 2 refills | Status: DC
Start: 2023-07-01 — End: 2023-07-30

## 2023-07-01 MED ORDER — DIVALPROEX SODIUM ER 250 MG PO TB24
250.0000 mg | ORAL_TABLET | Freq: Every day | ORAL | 2 refills | Status: DC
Start: 2023-07-01 — End: 2023-09-05

## 2023-07-27 ENCOUNTER — Other Ambulatory Visit (HOSPITAL_COMMUNITY): Payer: Self-pay | Admitting: Psychiatry

## 2023-07-27 DIAGNOSIS — F319 Bipolar disorder, unspecified: Secondary | ICD-10-CM

## 2023-07-28 ENCOUNTER — Encounter (HOSPITAL_COMMUNITY): Payer: Self-pay

## 2023-07-30 ENCOUNTER — Other Ambulatory Visit (HOSPITAL_COMMUNITY): Payer: Self-pay | Admitting: Student in an Organized Health Care Education/Training Program

## 2023-07-30 NOTE — Telephone Encounter (Signed)
Dx code F31.9

## 2023-08-02 ENCOUNTER — Other Ambulatory Visit: Payer: Self-pay | Admitting: Internal Medicine

## 2023-08-15 ENCOUNTER — Encounter (HOSPITAL_COMMUNITY): Payer: Medicare Other | Admitting: Student in an Organized Health Care Education/Training Program

## 2023-08-19 ENCOUNTER — Ambulatory Visit: Payer: Medicare Other | Admitting: Internal Medicine

## 2023-08-20 ENCOUNTER — Telehealth (HOSPITAL_COMMUNITY): Payer: Medicare Other | Admitting: Psychiatry

## 2023-09-01 ENCOUNTER — Telehealth: Payer: Self-pay | Admitting: Internal Medicine

## 2023-09-01 ENCOUNTER — Ambulatory Visit: Payer: Medicare Other | Admitting: Internal Medicine

## 2023-09-01 NOTE — Telephone Encounter (Signed)
Patient is on Dulera inhaler and it is not helping her. She believes that the dosage is not high enough which is causing her to still have flare ups. She is having trouble catching her breath and has a cough. Please call back.

## 2023-09-01 NOTE — Telephone Encounter (Signed)
Called and spoke to patient.  She feels that [facility] is not effective. She is questioning if dosage should be increased. She is not using Advair. She stated that she has not used Advair in years. She stated that she actually had a reaction to Advair.  She does have an albuterol inhaler. Xopenex is not covered by insurance and albuterol causes headaches.  C/o wheezing, increased SOB with exertion and prod cough with yellow to clear sputum x19mo. Sx comes and go. Current sx have been present for 1 week.  Denied f/c/s or additional sx.  Neg covid test last week. No supplemental oxygen. Spo2 maintains around 95-97%.   MR, please advise. Thanks

## 2023-09-02 NOTE — Telephone Encounter (Signed)
She needs a biologic against asthma but she is always refused biologic.  But she is always welcome to try the higher dose Dulera 200-5 or 2 puff 2 times daily

## 2023-09-03 MED ORDER — MOMETASONE FURO-FORMOTEROL FUM 200-5 MCG/ACT IN AERO: 2.0000 | INHALATION_SPRAY | Freq: Two times a day (BID) | RESPIRATORY_TRACT | 2 refills | Status: DC

## 2023-09-03 NOTE — Telephone Encounter (Signed)
I called the pt and there was no answer- LMTCB. ?

## 2023-09-03 NOTE — Telephone Encounter (Signed)
Ok thanks Take prednisone 40 mg daily x 2 days, then 20mg  daily x 2 days, then 10mg  daily x 2 days, then 5mg  daily x 2 days and stop  This isa  8day course

## 2023-09-03 NOTE — Telephone Encounter (Signed)
Dulera 200 sent to pharmacy. Patient is asking for prednisone taper. Patient says she will try this and if not effective she will have to consider going back on biologic. [facility] [address]

## 2023-09-05 ENCOUNTER — Ambulatory Visit (INDEPENDENT_AMBULATORY_CARE_PROVIDER_SITE_OTHER): Payer: Medicare Other | Admitting: Student in an Organized Health Care Education/Training Program

## 2023-09-05 ENCOUNTER — Other Ambulatory Visit (HOSPITAL_COMMUNITY): Payer: Self-pay | Admitting: Psychiatry

## 2023-09-05 ENCOUNTER — Encounter (HOSPITAL_COMMUNITY): Payer: Self-pay | Admitting: Student in an Organized Health Care Education/Training Program

## 2023-09-05 DIAGNOSIS — F319 Bipolar disorder, unspecified: Secondary | ICD-10-CM | POA: Diagnosis not present

## 2023-09-05 MED ORDER — DIVALPROEX SODIUM ER 250 MG PO TB24
250.0000 mg | ORAL_TABLET | Freq: Every day | ORAL | 1 refills | Status: DC
Start: 2023-09-05 — End: 2023-11-14

## 2023-09-05 MED ORDER — OLANZAPINE 10 MG PO TABS
10.0000 mg | ORAL_TABLET | Freq: Every day | ORAL | 1 refills | Status: DC
Start: 2023-09-05 — End: 2023-09-19
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
## 2023-09-05 NOTE — Telephone Encounter (Signed)
Left detailed message Prednisone sent to pharmacy and directions nothing further needed.

## 2023-09-08 ENCOUNTER — Other Ambulatory Visit: Payer: Self-pay | Admitting: Internal Medicine

## 2023-09-08 ENCOUNTER — Ambulatory Visit (INDEPENDENT_AMBULATORY_CARE_PROVIDER_SITE_OTHER): Payer: Medicare Other | Admitting: Internal Medicine

## 2023-09-08 ENCOUNTER — Encounter: Payer: Self-pay | Admitting: Internal Medicine

## 2023-09-08 VITALS — BP 140/110 | HR 63 | Temp 98.1°F | Ht 64.0 in | Wt 117.0 lb

## 2023-09-08 DIAGNOSIS — R634 Abnormal weight loss: Secondary | ICD-10-CM

## 2023-09-08 DIAGNOSIS — E559 Vitamin D deficiency, unspecified: Secondary | ICD-10-CM | POA: Diagnosis not present

## 2023-09-08 DIAGNOSIS — E782 Mixed hyperlipidemia: Secondary | ICD-10-CM

## 2023-09-08 DIAGNOSIS — J4550 Severe persistent asthma, uncomplicated: Secondary | ICD-10-CM | POA: Diagnosis not present

## 2023-09-08 DIAGNOSIS — R413 Other amnesia: Secondary | ICD-10-CM

## 2023-09-08 DIAGNOSIS — J449 Chronic obstructive pulmonary disease, unspecified: Secondary | ICD-10-CM

## 2023-09-08 DIAGNOSIS — I10 Essential (primary) hypertension: Secondary | ICD-10-CM | POA: Diagnosis not present

## 2023-09-08 DIAGNOSIS — R1032 Left lower quadrant pain: Secondary | ICD-10-CM | POA: Diagnosis not present

## 2023-09-08 LAB — LIPID PANEL
Cholesterol: 226 mg/dL — ABNORMAL HIGH (ref 0–200)
HDL: 47 mg/dL (ref >39.00)
LDL Cholesterol: 159 mg/dL — ABNORMAL HIGH (ref 0–99)
NonHDL: 178.56
Total CHOL/HDL Ratio: 5
Triglycerides: 100 mg/dL (ref 0.0–149.0)
VLDL: 20 mg/dL (ref 0.0–40.0)

## 2023-09-08 LAB — CBC
HCT: 35.7 % — ABNORMAL LOW (ref 36.0–46.0)
Hemoglobin: 11.1 g/dL — ABNORMAL LOW (ref 12.0–15.0)
MCHC: 31.1 g/dL (ref 30.0–36.0)
MCV: 88.5 fL (ref 78.0–100.0)
Platelets: 213 10*3/uL (ref 150.0–400.0)
RBC: 4.04 Mil/uL (ref 3.87–5.11)
RDW: 14.3 % (ref 11.5–15.5)
WBC: 8.3 10*3/uL (ref 4.0–10.5)

## 2023-09-08 LAB — VITAMIN B12: Vitamin B-12: 374 pg/mL (ref 211–911)

## 2023-09-08 LAB — COMPREHENSIVE METABOLIC PANEL
ALT: 7 U/L (ref 0–35)
AST: 14 U/L (ref 0–37)
Albumin: 3.9 g/dL (ref 3.5–5.2)
Alkaline Phosphatase: 66 U/L (ref 39–117)
BUN: 29 mg/dL — ABNORMAL HIGH (ref 6–23)
CO2: 25 meq/L (ref 19–32)
Calcium: 10.8 mg/dL — ABNORMAL HIGH (ref 8.4–10.5)
Chloride: 107 meq/L (ref 96–112)
Creatinine, Ser: 2.15 mg/dL — ABNORMAL HIGH (ref 0.40–1.20)
GFR: 21.23 mL/min — ABNORMAL LOW (ref >60.00)
Glucose, Bld: 103 mg/dL — ABNORMAL HIGH (ref 70–99)
Potassium: 4.2 meq/L (ref 3.5–5.1)
Sodium: 138 meq/L (ref 135–145)
Total Bilirubin: 0.4 mg/dL (ref 0.2–1.2)
Total Protein: 7.8 g/dL (ref 6.0–8.3)

## 2023-09-08 LAB — VITAMIN D 25 HYDROXY (VIT D DEFICIENCY, FRACTURES): VITD: 28.54 ng/mL — ABNORMAL LOW (ref 30.00–100.00)

## 2023-09-08 LAB — TSH: TSH: 0.7 u[IU]/mL (ref 0.35–5.50)

## 2023-09-08 MED ORDER — PREDNISONE 10 MG PO TABS: ORAL_TABLET | ORAL | 0 refills | Status: DC

## 2023-09-08 NOTE — Progress Notes (Unsigned)
Subjective:   Patient ID: [name] [name], female    DOB: 09-16-1941, 82 y.o.   MRN: [id number]  HPI The patient is an 82 YO female coming in for worsening abdominal pain in the last few months. This is causing poor eating and weight loss (down 30 pounds in last year and 15 pounds in last 6 months). She denies nausea or vomiting or change in BM. Does also have severe asthma and more wheezing lately with some cough. She is struggling with breathing even on same medications. Pulmonary recently changed medications. She is also having some memory changes. Patient and family member did not go into detail. Mental health is asking for her to be assessed by neurology for dementia (no recent brain imaging).   Review of Systems  Constitutional:  Positive for activity change, appetite change and unexpected weight change.  HENT: Negative.    Eyes: Negative.   Respiratory:  Positive for cough, shortness of breath and wheezing. Negative for chest tightness.   Cardiovascular:  Positive for chest pain. Negative for palpitations and leg swelling.  Gastrointestinal:  Positive for abdominal distention and abdominal pain. Negative for constipation, diarrhea, nausea and vomiting.  Musculoskeletal:  Positive for arthralgias, back pain and myalgias.  Skin: Negative.   Neurological: Negative.   Psychiatric/Behavioral: Negative.         Memory change    Objective:  Physical Exam Constitutional:      Appearance: She is well-developed.     Comments: Appears in pain during visit guarding abdomen  HENT:     Head: Normocephalic and atraumatic.  Cardiovascular:     Rate and Rhythm: Normal rate and regular rhythm.  Pulmonary:     Effort: Pulmonary effort is normal. No respiratory distress.     Breath sounds: Wheezing present. No rales.  Abdominal:     General: Bowel sounds are normal. There is no distension.     Palpations: Abdomen is soft.     Tenderness: There is abdominal tenderness. There is no rebound.      Comments: Diffuse tenderness she is holding lower abdomen more often than upper and seems more tender there  Musculoskeletal:        General: Tenderness present.     Cervical back: Normal range of motion.  Skin:    General: Skin is warm and dry.  Neurological:     General: No focal deficit present.     Mental Status: She is alert and oriented to person, place, and time.     Coordination: Coordination normal.     Vitals:   09/08/23 1448 09/08/23 1450  BP: (!) 140/110 (!) 140/110  Pulse: 63   Temp: 98.1 F (36.7 C)   TempSrc: Oral   SpO2: 99%   Weight: 117 lb (53.1 kg)   Height: 5\' 4"  (1.626 m)     Assessment & Plan:  Visit time 25 minutes in face to face communication with patient and coordination of care, additional 10 minutes spent in record review, coordination or care, ordering tests, communicating/referring to other healthcare professionals, documenting in medical records all on the same day of the visit for total time 35 minutes spent on the visit.

## 2023-09-08 NOTE — Patient Instructions (Addendum)
We have sent in the prednisone to take 4 pills daily days 1-2, then 2 pills daily days 3-4, then 1 pill daily days 5-6, then 1/2 pill daily 7-8.   We will check the labs and check a whole body scan to see why you are losing weight and hurting,  We will get you in with neurology.

## 2023-09-09 DIAGNOSIS — R634 Abnormal weight loss: Secondary | ICD-10-CM | POA: Insufficient documentation

## 2023-09-09 DIAGNOSIS — R413 Other amnesia: Secondary | ICD-10-CM | POA: Insufficient documentation

## 2023-09-09 DIAGNOSIS — R1032 Left lower quadrant pain: Secondary | ICD-10-CM | POA: Insufficient documentation

## 2023-09-09 DIAGNOSIS — R4189 Other symptoms and signs involving cognitive functions and awareness: Secondary | ICD-10-CM | POA: Insufficient documentation

## 2023-09-09 HISTORY — DX: Left lower quadrant pain: R10.32

## 2023-09-09 MED ORDER — NITROFURANTOIN MONOHYD MACRO 100 MG PO CAPS: 100.0000 mg | ORAL_CAPSULE | Freq: Two times a day (BID) | ORAL | 0 refills | Status: AC

## 2023-09-09 NOTE — Assessment & Plan Note (Signed)
With worsening SOB and wheezing and cough not improving. Pulmonary was supposed to call in prednisone last week and did not do so. Called in per their taper recommendation. Ordered CT chest without contrast given worsening symptoms, weight loss for full assessment.

## 2023-09-09 NOTE — Assessment & Plan Note (Signed)
Did not get prednisone taper called in from pulmonary so I sent this in per their instructions. She is struggling and some wheezing on exam today.

## 2023-09-09 NOTE — Assessment & Plan Note (Signed)
Unclear and we did not have time to address fully. Ordered CT head for acute memory change and no prior recent imaging. She did not want MRI. Referral to neurology done per daughter this is requested by mental health although I cannot review their notes due to privacy settings.

## 2023-09-09 NOTE — Assessment & Plan Note (Signed)
Checking CBC, CMP, TSH, vitamin D and B12. Getting CT abdomen and pelvis and chest and head to rule out cancer given progressive weight loss 30 pounds in the last year and 15 pounds in the last 6 months. She is having new abdominal pain and worsening SOB and wheezing and memory change so all scans are appropriate and clinically indicated.

## 2023-09-09 NOTE — Assessment & Plan Note (Addendum)
BP elevated likely due to pain she is in obvious discomfort. She is taking amlodipine 10 mg daily currently and will need close follow up. Adjust as needed. Checking CMP and CBC as none in close to a year

## 2023-09-09 NOTE — Assessment & Plan Note (Signed)
New in the past few months and significant weight loss. Ordered CT abdomen and pelvis without contrast to assess. She would not want treatment but establishing cause if there is one will help with prognostic and treatment/goals of care.

## 2023-09-09 NOTE — Assessment & Plan Note (Signed)
Checking lipid panel since doing labs today and adjust as needed not on meds.

## 2023-09-10 ENCOUNTER — Other Ambulatory Visit: Payer: Self-pay | Admitting: Internal Medicine

## 2023-09-10 DIAGNOSIS — N179 Acute kidney failure, unspecified: Secondary | ICD-10-CM

## 2023-09-11 ENCOUNTER — Ambulatory Visit
Admission: RE | Admit: 2023-09-11 | Discharge: 2023-09-11 | Disposition: A | Discharge status: Home / Self Care | Payer: Medicare Other | Source: Ambulatory Visit | Attending: Internal Medicine | Admitting: Internal Medicine

## 2023-09-11 DIAGNOSIS — R413 Other amnesia: Secondary | ICD-10-CM

## 2023-09-11 DIAGNOSIS — R1032 Left lower quadrant pain: Secondary | ICD-10-CM

## 2023-09-11 DIAGNOSIS — R634 Abnormal weight loss: Secondary | ICD-10-CM | POA: Diagnosis not present

## 2023-09-11 DIAGNOSIS — J449 Chronic obstructive pulmonary disease, unspecified: Secondary | ICD-10-CM | POA: Diagnosis not present

## 2023-09-11 DIAGNOSIS — R519 Headache, unspecified: Secondary | ICD-10-CM | POA: Diagnosis not present

## 2023-09-11 DIAGNOSIS — K5730 Diverticulosis of large intestine without perforation or abscess without bleeding: Secondary | ICD-10-CM | POA: Diagnosis not present

## 2023-09-17 ENCOUNTER — Other Ambulatory Visit (INDEPENDENT_AMBULATORY_CARE_PROVIDER_SITE_OTHER): Payer: Medicare Other

## 2023-09-17 ENCOUNTER — Encounter: Payer: Self-pay | Admitting: Internal Medicine

## 2023-09-17 ENCOUNTER — Other Ambulatory Visit: Payer: Self-pay | Admitting: Internal Medicine

## 2023-09-17 DIAGNOSIS — N179 Acute kidney failure, unspecified: Secondary | ICD-10-CM | POA: Diagnosis not present

## 2023-09-17 DIAGNOSIS — N1339 Other hydronephrosis: Secondary | ICD-10-CM

## 2023-09-17 LAB — RENAL FUNCTION PANEL
Albumin: 3.9 g/dL (ref 3.5–5.2)
BUN: 38 mg/dL — ABNORMAL HIGH (ref 6–23)
CO2: 24 meq/L (ref 19–32)
Calcium: 11.2 mg/dL — ABNORMAL HIGH (ref 8.4–10.5)
Chloride: 105 meq/L (ref 96–112)
Creatinine, Ser: 2.18 mg/dL — ABNORMAL HIGH (ref 0.40–1.20)
GFR: 20.87 mL/min — ABNORMAL LOW (ref >60.00)
Glucose, Bld: 109 mg/dL — ABNORMAL HIGH (ref 70–99)
Phosphorus: 3.3 mg/dL (ref 2.3–4.6)
Potassium: 4.2 meq/L (ref 3.5–5.1)
Sodium: 136 meq/L (ref 135–145)

## 2023-09-17 NOTE — Telephone Encounter (Signed)
I see the CT scan but has not been released ok to inform the patient that once they are reviewed someone will contact her back

## 2023-09-19 ENCOUNTER — Other Ambulatory Visit: Payer: Self-pay

## 2023-09-19 ENCOUNTER — Ambulatory Visit (INDEPENDENT_AMBULATORY_CARE_PROVIDER_SITE_OTHER): Payer: Medicare Other | Admitting: Student in an Organized Health Care Education/Training Program

## 2023-09-19 ENCOUNTER — Ambulatory Visit
Admission: EM | Admit: 2023-09-19 | Discharge: 2023-09-19 | Disposition: A | Discharge status: Home / Self Care | Payer: Medicare Other | Attending: Internal Medicine | Admitting: Internal Medicine

## 2023-09-19 ENCOUNTER — Inpatient Hospital Stay (HOSPITAL_COMMUNITY)
Admission: EM | Admit: 2023-09-19 | Discharge: 2023-09-23 | DRG: 689 | Disposition: A | Discharge status: Home / Self Care | Payer: Medicare Other | Attending: Family Medicine | Admitting: Family Medicine

## 2023-09-19 ENCOUNTER — Encounter (HOSPITAL_COMMUNITY): Payer: Self-pay | Admitting: *Deleted

## 2023-09-19 ENCOUNTER — Encounter (HOSPITAL_COMMUNITY): Payer: Self-pay | Admitting: Student in an Organized Health Care Education/Training Program

## 2023-09-19 ENCOUNTER — Emergency Department (HOSPITAL_COMMUNITY): Payer: Medicare Other

## 2023-09-19 VITALS — BP 155/96 | HR 100 | Wt 112.0 lb

## 2023-09-19 DIAGNOSIS — N136 Pyonephrosis: Secondary | ICD-10-CM | POA: Diagnosis present

## 2023-09-19 DIAGNOSIS — R109 Unspecified abdominal pain: Secondary | ICD-10-CM | POA: Diagnosis present

## 2023-09-19 DIAGNOSIS — R1032 Left lower quadrant pain: Secondary | ICD-10-CM

## 2023-09-19 DIAGNOSIS — M48061 Spinal stenosis, lumbar region without neurogenic claudication: Secondary | ICD-10-CM | POA: Diagnosis not present

## 2023-09-19 DIAGNOSIS — I160 Hypertensive urgency: Secondary | ICD-10-CM | POA: Diagnosis not present

## 2023-09-19 DIAGNOSIS — R627 Adult failure to thrive: Secondary | ICD-10-CM | POA: Diagnosis present

## 2023-09-19 DIAGNOSIS — K219 Gastro-esophageal reflux disease without esophagitis: Secondary | ICD-10-CM | POA: Diagnosis present

## 2023-09-19 DIAGNOSIS — E079 Disorder of thyroid, unspecified: Secondary | ICD-10-CM | POA: Diagnosis present

## 2023-09-19 DIAGNOSIS — J449 Chronic obstructive pulmonary disease, unspecified: Secondary | ICD-10-CM | POA: Diagnosis present

## 2023-09-19 DIAGNOSIS — K5730 Diverticulosis of large intestine without perforation or abscess without bleeding: Secondary | ICD-10-CM | POA: Diagnosis not present

## 2023-09-19 DIAGNOSIS — F0394 Unspecified dementia, unspecified severity, with anxiety: Secondary | ICD-10-CM | POA: Diagnosis present

## 2023-09-19 DIAGNOSIS — Z91040 Latex allergy status: Secondary | ICD-10-CM

## 2023-09-19 DIAGNOSIS — R519 Headache, unspecified: Secondary | ICD-10-CM | POA: Diagnosis not present

## 2023-09-19 DIAGNOSIS — Z888 Allergy status to other drugs, medicaments and biological substances status: Secondary | ICD-10-CM

## 2023-09-19 DIAGNOSIS — K118 Other diseases of salivary glands: Secondary | ICD-10-CM | POA: Diagnosis not present

## 2023-09-19 DIAGNOSIS — Z87891 Personal history of nicotine dependence: Secondary | ICD-10-CM

## 2023-09-19 DIAGNOSIS — F411 Generalized anxiety disorder: Secondary | ICD-10-CM

## 2023-09-19 DIAGNOSIS — Z79899 Other long term (current) drug therapy: Secondary | ICD-10-CM | POA: Diagnosis not present

## 2023-09-19 DIAGNOSIS — M4800 Spinal stenosis, site unspecified: Secondary | ICD-10-CM | POA: Diagnosis present

## 2023-09-19 DIAGNOSIS — M419 Scoliosis, unspecified: Secondary | ICD-10-CM | POA: Diagnosis present

## 2023-09-19 DIAGNOSIS — J441 Chronic obstructive pulmonary disease with (acute) exacerbation: Secondary | ICD-10-CM | POA: Diagnosis not present

## 2023-09-19 DIAGNOSIS — N179 Acute kidney failure, unspecified: Secondary | ICD-10-CM | POA: Diagnosis present

## 2023-09-19 DIAGNOSIS — M4186 Other forms of scoliosis, lumbar region: Secondary | ICD-10-CM | POA: Diagnosis not present

## 2023-09-19 DIAGNOSIS — Q630 Accessory kidney: Secondary | ICD-10-CM | POA: Diagnosis not present

## 2023-09-19 DIAGNOSIS — E785 Hyperlipidemia, unspecified: Secondary | ICD-10-CM | POA: Diagnosis present

## 2023-09-19 DIAGNOSIS — F319 Bipolar disorder, unspecified: Secondary | ICD-10-CM | POA: Diagnosis present

## 2023-09-19 DIAGNOSIS — M810 Age-related osteoporosis without current pathological fracture: Secondary | ICD-10-CM | POA: Diagnosis present

## 2023-09-19 DIAGNOSIS — Z681 Body mass index (BMI) 19 or less, adult: Secondary | ICD-10-CM

## 2023-09-19 DIAGNOSIS — E43 Unspecified severe protein-calorie malnutrition: Secondary | ICD-10-CM | POA: Diagnosis present

## 2023-09-19 DIAGNOSIS — M4319 Spondylolisthesis, multiple sites in spine: Secondary | ICD-10-CM | POA: Diagnosis not present

## 2023-09-19 DIAGNOSIS — R030 Elevated blood-pressure reading, without diagnosis of hypertension: Secondary | ICD-10-CM | POA: Diagnosis not present

## 2023-09-19 DIAGNOSIS — D638 Anemia in other chronic diseases classified elsewhere: Secondary | ICD-10-CM | POA: Diagnosis present

## 2023-09-19 DIAGNOSIS — N135 Crossing vessel and stricture of ureter without hydronephrosis: Secondary | ICD-10-CM

## 2023-09-19 DIAGNOSIS — M545 Low back pain, unspecified: Secondary | ICD-10-CM | POA: Diagnosis not present

## 2023-09-19 DIAGNOSIS — Z880 Allergy status to penicillin: Secondary | ICD-10-CM

## 2023-09-19 DIAGNOSIS — F0393 Unspecified dementia, unspecified severity, with mood disturbance: Secondary | ICD-10-CM | POA: Diagnosis present

## 2023-09-19 DIAGNOSIS — K7689 Other specified diseases of liver: Secondary | ICD-10-CM | POA: Diagnosis not present

## 2023-09-19 DIAGNOSIS — Z7951 Long term (current) use of inhaled steroids: Secondary | ICD-10-CM | POA: Diagnosis not present

## 2023-09-19 DIAGNOSIS — R531 Weakness: Secondary | ICD-10-CM

## 2023-09-19 DIAGNOSIS — Z882 Allergy status to sulfonamides status: Secondary | ICD-10-CM

## 2023-09-19 DIAGNOSIS — M4317 Spondylolisthesis, lumbosacral region: Secondary | ICD-10-CM | POA: Diagnosis present

## 2023-09-19 DIAGNOSIS — Z8249 Family history of ischemic heart disease and other diseases of the circulatory system: Secondary | ICD-10-CM

## 2023-09-19 DIAGNOSIS — M5126 Other intervertebral disc displacement, lumbar region: Secondary | ICD-10-CM | POA: Diagnosis not present

## 2023-09-19 DIAGNOSIS — Z823 Family history of stroke: Secondary | ICD-10-CM

## 2023-09-19 DIAGNOSIS — I10 Essential (primary) hypertension: Secondary | ICD-10-CM | POA: Diagnosis present

## 2023-09-19 DIAGNOSIS — N390 Urinary tract infection, site not specified: Secondary | ICD-10-CM | POA: Diagnosis not present

## 2023-09-19 DIAGNOSIS — G9389 Other specified disorders of brain: Secondary | ICD-10-CM | POA: Diagnosis not present

## 2023-09-19 DIAGNOSIS — Z860100 Personal history of colon polyps, unspecified: Secondary | ICD-10-CM

## 2023-09-19 DIAGNOSIS — Z881 Allergy status to other antibiotic agents status: Secondary | ICD-10-CM

## 2023-09-19 LAB — COMPREHENSIVE METABOLIC PANEL
ALT: 10 U/L (ref 0–44)
AST: 19 U/L (ref 15–41)
Albumin: 3.6 g/dL (ref 3.5–5.0)
Alkaline Phosphatase: 60 U/L (ref 38–126)
Anion gap: 9 (ref 5–15)
BUN: 47 mg/dL — ABNORMAL HIGH (ref 8–23)
CO2: 20 mmol/L — ABNORMAL LOW (ref 22–32)
Calcium: 10.9 mg/dL — ABNORMAL HIGH (ref 8.9–10.3)
Chloride: 108 mmol/L (ref 98–111)
Creatinine, Ser: 2.25 mg/dL — ABNORMAL HIGH (ref 0.44–1.00)
GFR, Estimated: 22 mL/min — ABNORMAL LOW (ref >60)
Glucose, Bld: 106 mg/dL — ABNORMAL HIGH (ref 70–99)
Potassium: 4.4 mmol/L (ref 3.5–5.1)
Sodium: 137 mmol/L (ref 135–145)
Total Bilirubin: 0.4 mg/dL (ref <1.2)
Total Protein: 7.6 g/dL (ref 6.5–8.1)

## 2023-09-19 LAB — CBC WITH DIFFERENTIAL/PLATELET
Abs Immature Granulocytes: 0.02 10*3/uL (ref 0.00–0.07)
Basophils Absolute: 0.1 10*3/uL (ref 0.0–0.1)
Basophils Relative: 2 %
Eosinophils Absolute: 1.2 10*3/uL — ABNORMAL HIGH (ref 0.0–0.5)
Eosinophils Relative: 14 %
HCT: 35 % — ABNORMAL LOW (ref 36.0–46.0)
Hemoglobin: 10.4 g/dL — ABNORMAL LOW (ref 12.0–15.0)
Immature Granulocytes: 0 %
Lymphocytes Relative: 29 %
Lymphs Abs: 2.4 10*3/uL (ref 0.7–4.0)
MCH: 27.3 pg (ref 26.0–34.0)
MCHC: 29.7 g/dL — ABNORMAL LOW (ref 30.0–36.0)
MCV: 91.9 fL (ref 80.0–100.0)
Monocytes Absolute: 0.7 10*3/uL (ref 0.1–1.0)
Monocytes Relative: 9 %
Neutro Abs: 3.8 10*3/uL (ref 1.7–7.7)
Neutrophils Relative %: 46 %
Platelets: 252 10*3/uL (ref 150–400)
RBC: 3.81 MIL/uL — ABNORMAL LOW (ref 3.87–5.11)
RDW: 14 % (ref 11.5–15.5)
WBC: 8.2 10*3/uL (ref 4.0–10.5)
nRBC: 0 % (ref 0.0–0.2)

## 2023-09-19 LAB — POCT URINALYSIS DIP (MANUAL ENTRY)
Bilirubin, UA: NEGATIVE
Glucose, UA: NEGATIVE mg/dL
Nitrite, UA: NEGATIVE
Protein Ur, POC: 100 mg/dL — AB
Spec Grav, UA: 1.02 (ref 1.010–1.025)
Urobilinogen, UA: 0.2 U/dL
pH, UA: 5.5 (ref 5.0–8.0)

## 2023-09-19 LAB — LIPASE, BLOOD: Lipase: 45 U/L (ref 11–51)

## 2023-09-19 MED ORDER — QUETIAPINE FUMARATE 100 MG PO TABS: 100.0000 mg | ORAL_TABLET | Freq: Every day | ORAL | 1 refills | Status: DC

## 2023-09-19 MED ORDER — HYDROXYZINE HCL 10 MG PO TABS: 10.0000 mg | ORAL_TABLET | Freq: Three times a day (TID) | ORAL | 0 refills | Status: DC | PRN

## 2023-09-19 NOTE — ED Triage Notes (Signed)
Here with Daughter. "She had a Primary Care appt on 10-28 but was complaining of the stomach pain then, CT done on numerous areas for FTT, Labs done (and repeated normal). CT showed some changes (on left side/abd/kidney area). She was also started on Macrobid on the 29th for possible UTI. The pain isn't improving now and needs UA rechecked. No nausea. Some vomiting "only to something I eat and drink at times". No fever.
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
## 2023-09-19 NOTE — ED Provider Triage Note (Signed)
Emergency Medicine Provider Triage Evaluation Note  [name] [name] , a 82 y.o. female  was evaluated in triage.  Patient with multiple complaints, but most significantly complains of left flank pain ongoing for the past couple months.  Reports pain waxes and wanes in severity.  Denies any dysuria, hematuria, increased frequency.  Denies fever or chills.  Denies nausea or vomiting.  No acute worsening in symptoms today.  PCP stated that her creatinine was more elevated than normal at her last visit at 2.18  Review of Systems  Positive: As above Negative: As above  Physical Exam  BP (!) 192/90 (BP Location: Right Arm)   Pulse 78   Temp 98.5 F (36.9 C) (Oral)   Resp 16   Ht 5\' 4"  (1.626 m)   Wt 50.8 kg   SpO2 98%   BMI 19.22 kg/m  Gen:   Awake, no distress   Resp:  Normal effort  MSK:   Moves extremities without difficulty  Other:  No abdominal tenderness to palpation, left CVA tenderness  Medical Decision Making  Medically screening exam initiated at 6:42 PM.  Appropriate orders placed.  [name] [name] was informed that the remainder of the evaluation will be completed by another provider, this initial triage assessment does not replace that evaluation, and the importance of remaining in the ED until their evaluation is complete.     Arabella Merles, PA-C 09/19/23 1843

## 2023-09-19 NOTE — Discharge Instructions (Signed)
Please go to the emergency department as soon as you leave urgent care for further evaluation and management. ?
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
## 2023-09-19 NOTE — ED Triage Notes (Signed)
The pt is c/o flank and abd pain and she has had numerus other pains and studies for the past year  recent kidney tests elevated

## 2023-09-19 NOTE — ED Notes (Signed)
Patient is being discharged from the Urgent Care and sent to the Emergency Department via Private Vehicle (Daughter) . Per Laren Everts NP, patient is in need of higher level of care due to Abd Pain (Complex) and History. Patient is aware and verbalizes understanding of plan of care.  Vitals:   09/19/23 1147 09/19/23 1237  BP: (!) 194/82 (!) 200/94  Pulse: 67   Resp: 18   Temp: 97.9 F (36.6 C)   SpO2: 97%

## 2023-09-20 ENCOUNTER — Other Ambulatory Visit: Payer: Self-pay

## 2023-09-20 ENCOUNTER — Emergency Department (HOSPITAL_COMMUNITY): Payer: Medicare Other

## 2023-09-20 DIAGNOSIS — K219 Gastro-esophageal reflux disease without esophagitis: Secondary | ICD-10-CM | POA: Diagnosis present

## 2023-09-20 DIAGNOSIS — R109 Unspecified abdominal pain: Secondary | ICD-10-CM | POA: Diagnosis not present

## 2023-09-20 DIAGNOSIS — Z79899 Other long term (current) drug therapy: Secondary | ICD-10-CM | POA: Diagnosis not present

## 2023-09-20 DIAGNOSIS — E43 Unspecified severe protein-calorie malnutrition: Secondary | ICD-10-CM | POA: Diagnosis present

## 2023-09-20 DIAGNOSIS — F0393 Unspecified dementia, unspecified severity, with mood disturbance: Secondary | ICD-10-CM | POA: Diagnosis present

## 2023-09-20 DIAGNOSIS — K118 Other diseases of salivary glands: Secondary | ICD-10-CM | POA: Diagnosis present

## 2023-09-20 DIAGNOSIS — E079 Disorder of thyroid, unspecified: Secondary | ICD-10-CM | POA: Diagnosis present

## 2023-09-20 DIAGNOSIS — M4800 Spinal stenosis, site unspecified: Secondary | ICD-10-CM

## 2023-09-20 DIAGNOSIS — R531 Weakness: Secondary | ICD-10-CM

## 2023-09-20 DIAGNOSIS — M4319 Spondylolisthesis, multiple sites in spine: Secondary | ICD-10-CM | POA: Diagnosis not present

## 2023-09-20 DIAGNOSIS — R519 Headache, unspecified: Secondary | ICD-10-CM | POA: Diagnosis not present

## 2023-09-20 DIAGNOSIS — Z681 Body mass index (BMI) 19 or less, adult: Secondary | ICD-10-CM | POA: Diagnosis not present

## 2023-09-20 DIAGNOSIS — I10 Essential (primary) hypertension: Secondary | ICD-10-CM | POA: Diagnosis present

## 2023-09-20 DIAGNOSIS — M4317 Spondylolisthesis, lumbosacral region: Secondary | ICD-10-CM | POA: Diagnosis present

## 2023-09-20 DIAGNOSIS — N390 Urinary tract infection, site not specified: Secondary | ICD-10-CM | POA: Diagnosis present

## 2023-09-20 DIAGNOSIS — D638 Anemia in other chronic diseases classified elsewhere: Secondary | ICD-10-CM | POA: Diagnosis present

## 2023-09-20 DIAGNOSIS — M419 Scoliosis, unspecified: Secondary | ICD-10-CM | POA: Diagnosis present

## 2023-09-20 DIAGNOSIS — N136 Pyonephrosis: Secondary | ICD-10-CM | POA: Diagnosis present

## 2023-09-20 DIAGNOSIS — J441 Chronic obstructive pulmonary disease with (acute) exacerbation: Secondary | ICD-10-CM | POA: Diagnosis present

## 2023-09-20 DIAGNOSIS — E785 Hyperlipidemia, unspecified: Secondary | ICD-10-CM | POA: Diagnosis present

## 2023-09-20 DIAGNOSIS — R627 Adult failure to thrive: Secondary | ICD-10-CM | POA: Diagnosis present

## 2023-09-20 DIAGNOSIS — F0394 Unspecified dementia, unspecified severity, with anxiety: Secondary | ICD-10-CM | POA: Diagnosis present

## 2023-09-20 DIAGNOSIS — I160 Hypertensive urgency: Secondary | ICD-10-CM | POA: Diagnosis present

## 2023-09-20 DIAGNOSIS — F319 Bipolar disorder, unspecified: Secondary | ICD-10-CM

## 2023-09-20 DIAGNOSIS — M810 Age-related osteoporosis without current pathological fracture: Secondary | ICD-10-CM | POA: Diagnosis present

## 2023-09-20 DIAGNOSIS — N179 Acute kidney failure, unspecified: Secondary | ICD-10-CM

## 2023-09-20 DIAGNOSIS — Z7951 Long term (current) use of inhaled steroids: Secondary | ICD-10-CM | POA: Diagnosis not present

## 2023-09-20 DIAGNOSIS — M4186 Other forms of scoliosis, lumbar region: Secondary | ICD-10-CM | POA: Diagnosis not present

## 2023-09-20 DIAGNOSIS — M5126 Other intervertebral disc displacement, lumbar region: Secondary | ICD-10-CM | POA: Diagnosis not present

## 2023-09-20 DIAGNOSIS — G9389 Other specified disorders of brain: Secondary | ICD-10-CM | POA: Diagnosis not present

## 2023-09-20 DIAGNOSIS — M48061 Spinal stenosis, lumbar region without neurogenic claudication: Secondary | ICD-10-CM | POA: Diagnosis present

## 2023-09-20 DIAGNOSIS — Z87891 Personal history of nicotine dependence: Secondary | ICD-10-CM | POA: Diagnosis not present

## 2023-09-20 HISTORY — DX: Anemia in other chronic diseases classified elsewhere: D63.8

## 2023-09-20 HISTORY — DX: Other diseases of salivary glands: K11.8

## 2023-09-20 HISTORY — DX: Spinal stenosis, site unspecified: M48.00

## 2023-09-20 LAB — TROPONIN I (HIGH SENSITIVITY)
Troponin I (High Sensitivity): 26 ng/L — ABNORMAL HIGH (ref <18)
Troponin I (High Sensitivity): 25 ng/L — ABNORMAL HIGH (ref <18)

## 2023-09-20 MED ORDER — DIVALPROEX SODIUM ER 250 MG PO TB24
250.0000 mg | ORAL_TABLET | Freq: Every day | ORAL | Status: DC
  Administered 2023-09-20 – 2023-09-22 (×3): 250 mg via ORAL
  Filled 2023-09-20 (×4): qty 1

## 2023-09-20 MED ORDER — HEPARIN SODIUM (PORCINE) 5000 UNIT/ML IJ SOLN
5000.0000 [IU] | Freq: Three times a day (TID) | INTRAMUSCULAR | Status: DC
  Filled 2023-09-20 (×4): qty 1

## 2023-09-20 MED ORDER — PREDNISONE 20 MG PO TABS
60.0000 mg | ORAL_TABLET | Freq: Once | ORAL | Status: AC
  Administered 2023-09-20: 60 mg via ORAL
  Filled 2023-09-20: qty 3

## 2023-09-20 MED ORDER — LORAZEPAM 1 MG PO TABS
0.5000 mg | ORAL_TABLET | Freq: Once | ORAL | Status: AC
  Administered 2023-09-20: 0.5 mg via ORAL
  Filled 2023-09-20: qty 1

## 2023-09-20 MED ORDER — AMLODIPINE BESYLATE 5 MG PO TABS
10.0000 mg | ORAL_TABLET | Freq: Once | ORAL | Status: AC
  Administered 2023-09-20: 10 mg via ORAL
  Filled 2023-09-20: qty 2

## 2023-09-20 MED ORDER — SODIUM CHLORIDE 0.9 % IV SOLN
1.0000 g | INTRAVENOUS | Status: DC
  Administered 2023-09-20 – 2023-09-22 (×3): 1 g via INTRAVENOUS
  Filled 2023-09-20 (×3): qty 10

## 2023-09-20 MED ORDER — HYDRALAZINE HCL 20 MG/ML IJ SOLN
5.0000 mg | Freq: Once | INTRAMUSCULAR | Status: AC
  Administered 2023-09-20: 5 mg via INTRAVENOUS
  Filled 2023-09-20: qty 1

## 2023-09-20 MED ORDER — LEVALBUTEROL HCL 0.63 MG/3ML IN NEBU
0.6300 mg | INHALATION_SOLUTION | Freq: Four times a day (QID) | RESPIRATORY_TRACT | Status: DC
Start: 2023-09-20 — End: 2023-09-20
  Filled 2023-09-20: qty 3

## 2023-09-20 MED ORDER — HYDROCODONE-ACETAMINOPHEN 5-325 MG PO TABS
1.0000 | ORAL_TABLET | Freq: Four times a day (QID) | ORAL | Status: DC | PRN
  Administered 2023-09-20 – 2023-09-22 (×3): 1 via ORAL
  Filled 2023-09-20 (×3): qty 1

## 2023-09-20 MED ORDER — ARFORMOTEROL TARTRATE 15 MCG/2ML IN NEBU
15.0000 ug | INHALATION_SOLUTION | Freq: Two times a day (BID) | RESPIRATORY_TRACT | Status: DC
  Administered 2023-09-21 – 2023-09-22 (×4): 15 ug via RESPIRATORY_TRACT
  Filled 2023-09-20 (×6): qty 2

## 2023-09-20 MED ORDER — ONDANSETRON HCL 4 MG/2ML IJ SOLN: 4.0000 mg | Freq: Four times a day (QID) | INTRAMUSCULAR | Status: DC | PRN

## 2023-09-20 MED ORDER — HYDRALAZINE HCL 20 MG/ML IJ SOLN
10.0000 mg | INTRAMUSCULAR | Status: DC | PRN
  Administered 2023-09-21 – 2023-09-22 (×3): 10 mg via INTRAVENOUS
  Filled 2023-09-20 (×3): qty 1

## 2023-09-20 MED ORDER — LACTATED RINGERS IV BOLUS
1000.0000 mL | Freq: Once | INTRAVENOUS | Status: AC
  Administered 2023-09-20: 1000 mL via INTRAVENOUS

## 2023-09-20 MED ORDER — BOOST / RESOURCE BREEZE PO LIQD CUSTOM
1.0000 | Freq: Three times a day (TID) | ORAL | Status: DC
  Administered 2023-09-20 (×2): 1 via ORAL

## 2023-09-20 MED ORDER — HYDROXYZINE HCL 10 MG PO TABS
10.0000 mg | ORAL_TABLET | Freq: Three times a day (TID) | ORAL | Status: DC | PRN
  Administered 2023-09-21 – 2023-09-22 (×3): 10 mg via ORAL
  Filled 2023-09-20 (×3): qty 1

## 2023-09-20 MED ORDER — AMLODIPINE BESYLATE 5 MG PO TABS
10.0000 mg | ORAL_TABLET | Freq: Every day | ORAL | Status: DC
  Administered 2023-09-21 – 2023-09-23 (×3): 10 mg via ORAL
  Filled 2023-09-20 (×3): qty 2

## 2023-09-20 MED ORDER — ONDANSETRON HCL 4 MG PO TABS: 4.0000 mg | ORAL_TABLET | Freq: Four times a day (QID) | ORAL | Status: DC | PRN

## 2023-09-20 MED ORDER — LEVALBUTEROL HCL 0.63 MG/3ML IN NEBU
0.6300 mg | INHALATION_SOLUTION | Freq: Four times a day (QID) | RESPIRATORY_TRACT | Status: DC | PRN
  Administered 2023-09-21: 0.63 mg via RESPIRATORY_TRACT
  Filled 2023-09-20: qty 3

## 2023-09-20 MED ORDER — ACETAMINOPHEN 650 MG RE SUPP: 650.0000 mg | Freq: Four times a day (QID) | RECTAL | Status: DC | PRN

## 2023-09-20 MED ORDER — BUDESONIDE 0.5 MG/2ML IN SUSP
0.5000 mg | Freq: Two times a day (BID) | RESPIRATORY_TRACT | Status: DC
  Administered 2023-09-21 – 2023-09-22 (×4): 0.5 mg via RESPIRATORY_TRACT
  Filled 2023-09-20 (×6): qty 2

## 2023-09-20 MED ORDER — ACETAMINOPHEN 325 MG PO TABS: 650.0000 mg | ORAL_TABLET | Freq: Four times a day (QID) | ORAL | Status: DC | PRN

## 2023-09-20 MED ORDER — MOMETASONE FURO-FORMOTEROL FUM 200-5 MCG/ACT IN AERO
2.0000 | INHALATION_SPRAY | Freq: Two times a day (BID) | RESPIRATORY_TRACT | Status: DC
Start: 2023-09-20 — End: 2023-09-20
  Administered 2023-09-20: 2 via RESPIRATORY_TRACT
  Filled 2023-09-20: qty 8.8

## 2023-09-20 MED ORDER — SODIUM CHLORIDE 0.9 % IV SOLN: INTRAVENOUS | Status: AC

## 2023-09-20 MED ORDER — ACETAMINOPHEN 325 MG PO TABS
650.0000 mg | ORAL_TABLET | Freq: Once | ORAL | Status: AC
  Administered 2023-09-20: 650 mg via ORAL
  Filled 2023-09-20: qty 2

## 2023-09-20 MED ORDER — SODIUM CHLORIDE 0.9% FLUSH
3.0000 mL | Freq: Two times a day (BID) | INTRAVENOUS | Status: DC
  Administered 2023-09-20 – 2023-09-23 (×5): 3 mL via INTRAVENOUS

## 2023-09-20 MED ORDER — LORAZEPAM 2 MG/ML IJ SOLN
1.0000 mg | Freq: Four times a day (QID) | INTRAMUSCULAR | Status: DC | PRN
  Filled 2023-09-20: qty 1

## 2023-09-20 MED ORDER — OLOPATADINE HCL 0.1 % OP SOLN
1.0000 [drp] | Freq: Every day | OPHTHALMIC | Status: DC
  Administered 2023-09-20 – 2023-09-22 (×3): 1 [drp] via OPHTHALMIC
  Filled 2023-09-20: qty 5

## 2023-09-20 MED ORDER — PREDNISONE 20 MG PO TABS
40.0000 mg | ORAL_TABLET | Freq: Every day | ORAL | Status: DC
  Administered 2023-09-21 – 2023-09-23 (×3): 40 mg via ORAL
  Filled 2023-09-20 (×3): qty 2

## 2023-09-20 MED ORDER — PANTOPRAZOLE SODIUM 40 MG PO TBEC
40.0000 mg | DELAYED_RELEASE_TABLET | Freq: Every day | ORAL | Status: DC
  Administered 2023-09-20 – 2023-09-23 (×4): 40 mg via ORAL
  Filled 2023-09-20 (×4): qty 1

## 2023-09-20 MED ORDER — QUETIAPINE FUMARATE 100 MG PO TABS
100.0000 mg | ORAL_TABLET | Freq: Every day | ORAL | Status: DC
  Administered 2023-09-20 – 2023-09-22 (×3): 100 mg via ORAL
  Filled 2023-09-20 (×3): qty 1

## 2023-09-20 MED ORDER — MONTELUKAST SODIUM 10 MG PO TABS
10.0000 mg | ORAL_TABLET | Freq: Every day | ORAL | Status: DC
  Administered 2023-09-20 – 2023-09-22 (×3): 10 mg via ORAL
  Filled 2023-09-20 (×3): qty 1
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
## 2023-09-20 NOTE — ED Provider Notes (Signed)
[place] [facility] DE[facility]MENT AT [facility] Provider Note   CSN: [id number] Arrival date & time: 09/19/23  1801     History  Chief Complaint  Patient presents with   Abdominal Pain    [name] [name] is a 82 y.o. female.  Level 5 caveat for dementia.  Patient here with daughter.  She has had ongoing left-sided flank and low back pain for several weeks.  No fall or injury.  She saw her doctor about this pain in October 28 which is the first time she mention this pain.  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  Patient was referred to her urology given that her creatinine was 2.18 from 1.181-year ago.  She does not see urology until December. Seen in urgent care today and referred to the ED for expedited workup.  Patient is a poor historian but reports she has had left-sided flank pain for many weeks.  Taking Tylenol at home without relief.  Pain does not radiate down her leg.  No associated weakness, numbness, tingling, bowel or bladder incontinence.  No fever or vomiting.  No pain with urination or blood in the urine.  No abdominal pain.  No chest pain or shortness of breath.  Denies any acute changes symptoms today though daughter reports patient seems more off balance than usual.  Daughter concerned about elevated creatinine and patient's weight loss within the past few weeks.  The history is provided by the patient and a relative.  Abdominal Pain Associated symptoms: no dysuria, no fever, no nausea, no shortness of breath and no vomiting        Home Medications Prior to Admission medications   Medication Sig Start Date End Date Taking? Authorizing Provider  amLODipine (NORVASC) 10 MG tablet Take 1 tablet (10 mg total) by mouth daily. 09/12/22   Myrlene Broker, MD  Cholecalciferol ([employer] D3) 50 MCG (2000 UT) CAPS Take 1 capsule (2,000 Units total) by mouth daily. 09/12/22   Myrlene Broker, MD   divalproex (DEPAKOTE ER) 250 MG 24 hr tablet Take 1 tablet (250 mg total) by mouth at bedtime. 09/05/23   Eliseo Gum B, MD  EPINEPHrine 0.3 mg/0.3 mL IJ SOAJ injection INJECT 0.3 MLS INTO THE MUSCLE ONCE FOR 1 DOSE. 05/07/22   Myrlene Broker, MD  fluticasone-salmeterol (ADVAIR HFA) [phone] MCG/ACT inhaler Inhale 2 puffs into the lungs 2 (two) times daily. 03/25/23   Kalman Shan, MD  hydrOXYzine (ATARAX) 10 MG tablet Take 1 tablet (10 mg total) by mouth 3 (three) times daily as needed. 09/19/23   Bobbye Morton, MD  mometasone-formoterol (DULERA) 200-5 MCG/ACT AERO Inhale 2 puffs into the lungs 2 (two) times daily. 09/03/23   Kalman Shan, MD  montelukast (SINGULAIR) 10 MG tablet Take 1 tablet (10 mg total) by mouth at bedtime. 03/19/23   Kalman Shan, MD  Olopatadine HCl (PATADAY OP) Place 1 drop into both eyes daily.    [provider]  pantoprazole (PROTONIX) 40 MG tablet TAKE 1 TABLET BY MOUTH EVERY DAY 08/04/23   Myrlene Broker, MD  predniSONE (DELTASONE) 10 MG tablet Take 4 pills daily days 1-2, then 2 pills daily days 3-4, then 1 pill daily days 5-6, then 1/2 pill daily 7-8 Patient not taking: Reported on 09/19/2023 09/08/23   Myrlene Broker, MD  QUEtiapine (SEROQUEL) 100 MG tablet Take 1 tablet (100 mg total) by mouth at bedtime. 09/19/23  Bobbye Morton, MD  XOPENEX 0.63 MG/3ML nebulizer solution Take 3 mLs (0.63 mg total) by nebulization every 4 (four) hours as needed for wheezing or shortness of breath. 02/20/23   Myrlene Broker, MD  XOPENEX HFA 45 MCG/ACT inhaler INHALE 1 TO 2 PUFFS BY MOUTH EVERY 6 HOURS AS NEEDED FOR WHEEZE 05/03/22   Kalman Shan, MD      Allergies    Influenza vaccines, Latex, Albuterol, Advair diskus [fluticasone-salmeterol], Diclofenac sodium, Diclofenac sodium, Doxycycline, Dulera [mometasone furo-formoterol fum], Other, Penicillins, Sulfonamide derivatives, Tiotropium, and Tiotropium bromide monohydrate     Review of Systems   Review of Systems  Constitutional:  Negative for activity change, appetite change and fever.  HENT:  Negative for congestion and rhinorrhea.   Respiratory:  Negative for chest tightness and shortness of breath.   Gastrointestinal:  Positive for abdominal pain. Negative for nausea and vomiting.  Genitourinary:  Negative for dysuria.  Musculoskeletal:  Positive for back pain. Negative for arthralgias and myalgias.  Skin:  Negative for rash.  Neurological:  Positive for dizziness and weakness. Negative for headaches.   all other systems are negative except as noted in the HPI and PMH.    Physical Exam Updated Vital Signs BP (!) 182/91 (BP Location: Right Arm)   Pulse 64   Temp 99 F (37.2 C)   Resp 16   Ht 5\' 4"  (1.626 m)   Wt 50.8 kg   SpO2 99%   BMI 19.22 kg/m  Physical Exam Vitals and nursing note reviewed.  Constitutional:      General: She is not in acute distress.    Appearance: She is well-developed.  HENT:     Head: Normocephalic and atraumatic.     Mouth/Throat:     Pharynx: No oropharyngeal exudate.  Eyes:     Conjunctiva/sclera: Conjunctivae normal.     Pupils: Pupils are equal, round, and reactive to light.  Neck:     Comments: No meningismus. Cardiovascular:     Rate and Rhythm: Normal rate and regular rhythm.     Heart sounds: Normal heart sounds. No murmur heard. Pulmonary:     Effort: Pulmonary effort is normal. No respiratory distress.     Breath sounds: Normal breath sounds.  Abdominal:     Palpations: Abdomen is soft.     Tenderness: There is no abdominal tenderness. There is no guarding or rebound.  Musculoskeletal:        General: Tenderness present. Normal range of motion.     Cervical back: Normal range of motion and neck supple.     Comments: Left paraspinal lumbar tenderness, no midline tenderness, no rash  5/5 strength in bilateral lower extremities. Ankle plantar and dorsiflexion intact. Great toe extension intact  bilaterally. +2 DP and PT pulses. Normal gait.   Skin:    General: Skin is warm.  Neurological:     Mental Status: She is alert and oriented to person, place, and time.     Cranial Nerves: No cranial nerve deficit.     Motor: No abnormal muscle tone.     Coordination: Coordination normal.     Comments:  5/5 strength throughout. CN 2-12 intact.Equal grip strength.   Psychiatric:        Behavior: Behavior normal.     ED Results / Procedures / Treatments   Labs (all labs ordered are listed, but only abnormal results are displayed) Labs Reviewed  COMPREHENSIVE METABOLIC PANEL - Abnormal; Notable for the following components:  Result Value   CO2 20 (*)    Glucose, Bld 106 (*)    BUN 47 (*)    Creatinine, Ser 2.25 (*)    Calcium 10.9 (*)    GFR, Estimated 22 (*)    All other components within normal limits  CBC WITH DIFFERENTIAL/PLATELET - Abnormal; Notable for the following components:   RBC 3.81 (*)    Hemoglobin 10.4 (*)    HCT 35.0 (*)    MCHC 29.7 (*)    Eosinophils Absolute 1.2 (*)    All other components within normal limits  TROPONIN I (HIGH SENSITIVITY) - Abnormal; Notable for the following components:   Troponin I (High Sensitivity) 26 (*)    All other components within normal limits  TROPONIN I (HIGH SENSITIVITY) - Abnormal; Notable for the following components:   Troponin I (High Sensitivity) 25 (*)    All other components within normal limits  LIPASE, BLOOD  URINALYSIS, ROUTINE W REFLEX MICROSCOPIC    EKG None  Radiology MR LUMBAR SPINE WO CONTRAST  Result Date: 09/20/2023 CLINICAL DATA:  82 year old female with memory loss, headache, left flank, back pain. EXAM: MRI LUMBAR SPINE WITHOUT CONTRAST TECHNIQUE: Multiplanar, multisequence MR imaging of the lumbar spine was performed. No intravenous contrast was administered. COMPARISON:  CT Abdomen and Pelvis yesterday. FINDINGS: Segmentation:  Normal. Alignment: Lumbar lordosis superimposed on up to moderate  dextroconvex lumbar scoliosis. Grade 1 spondylolisthesis of L5 on S1. Subtle retrolisthesis of L2 on L3. Vertebrae: Background bone marrow signal is within normal limits. Preserved vertebral body height. Intact visible sacrum and SI joints. No marrow edema or evidence of acute osseous abnormality. Conus medullaris and cauda equina: Conus extends to the L1-L2 level. No lower spinal cord or conus signal abnormality. Paraspinal and other soft tissues: Stable from the CT Abdomen and Pelvis yesterday. Negative visualized posterior paraspinal soft tissues. Disc levels: Visible lower thoracic spine degeneration with no convincing lower thoracic spinal stenosis through T12-L1. L1-L2: Asymmetric disc bulging mostly affecting the neural foramina. Facet hypertrophy on the left is mild-to-moderate. No spinal stenosis. Moderate to severe left L1 foraminal stenosis. L2-L3: More circumferential disc bulge. Mild to moderate bilateral facet and ligament flavum hypertrophy. Borderline to mild spinal stenosis. Moderate bilateral L2 foraminal stenosis. L3-L4: Circumferential disc bulge. Broad-based posterior and foraminal involvement. Mild to moderate facet and ligament flavum hypertrophy. Mild spinal stenosis. Little lateral recess stenosis but moderate to severe left and moderate right L3 foraminal stenosis. L4-L5: Asymmetric disc bulging and endplate spurring to the right. Mild to moderate facet and ligament flavum hypertrophy. No spinal stenosis. Mild right lateral recess stenosis (right L5 nerve level). Mild left L4 foraminal stenosis. Moderate to severe right L4 foraminal stenosis. L5-S1: Grade 1 anterolisthesis. Circumferential disc/pseudo disc. Moderate to severe facet hypertrophy greater on the right. No spinal or lateral recess stenosis. Mild left and moderate right L5 foraminal stenosis. IMPRESSION: 1. Dextroconvex lumbar scoliosis with grade 1 spondylolisthesis at L5-S1. No acute osseous abnormality. 2. 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 Associated up to Severe neural foraminal stenosis at the left L1, left L3, and right L4 nerve levels. Frequently moderate neural foraminal stenosis at the remaining levels. Electronically Signed   By: Odessa Fleming M.D.   On: 09/20/2023 05:42   MR BRAIN WO CONTRAST  Result Date: 09/20/2023 CLINICAL DATA:  82 year old female with memory loss, headache, left flank, back pain. EXAM: MRI HEAD WITHOUT CONTRAST TECHNIQUE: Multiplanar, multiecho pulse sequences of the brain  and surrounding structures were obtained without intravenous contrast. COMPARISON:  Head CT 09/11/2023. FINDINGS: Brain: No restricted diffusion to suggest acute infarction. No midline shift, mass effect, evidence of mass lesion, ventriculomegaly, extra-axial collection or acute intracranial hemorrhage. Cervicomedullary junction and pituitary are within normal limits. Normal cerebral volume for age. Axial FLAIR series motion artifact. Mild for age scattered cerebral white matter T2 and FLAIR hyperintensity, similar heterogeneity in the bilateral deep gray nuclei. No cortical encephalomalacia identified. There is a chronic microhemorrhage in the left thalamus series 12, image 34. And there is a larger area of chronic hemorrhage in the posterior left corona radiata. Subtle encephalomalacia there by CT (series 12, image 36 on SWI). No other chronic cerebral blood products. Brainstem and cerebellum appear negative. Vascular: Major intracranial vascular flow voids are preserved. Skull and upper cervical spine: Normal for age visible cervical spine. Visualized bone marrow signal is within normal limits. Sinuses/Orbits: Postoperative changes to both globes. Chronic paranasal sinus mucosal thickening, fluid levels. Other: Mastoids are clear. Grossly normal visible internal auditory structures. 17 mm oval T2 hypointense soft tissue nodule in the right parotid gland with mildly abnormal  diffusion. IMPRESSION: 1. No acute intracranial abnormality. 2. 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 3. 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 4. Acute on chronic paranasal sinus inflammation. Electronically Signed   By: Odessa Fleming M.D.   On: 09/20/2023 05:37   CT ABDOMEN PELVIS WO CONTRAST  Result Date: 09/19/2023 CLINICAL DATA:  Left flank pain EXAM: CT ABDOMEN AND PELVIS WITHOUT CONTRAST TECHNIQUE: Multidetector CT imaging of the abdomen and pelvis was performed following the standard protocol without IV contrast. RADIATION DOSE REDUCTION: This exam was performed according to the departmental dose-optimization program which includes automated exposure control, adjustment of the mA and/or kV according to patient size and/or use of iterative reconstruction technique. COMPARISON:  CT abdomen and pelvis 09/11/2023 FINDINGS: Lower chest: No acute abnormality. Hepatobiliary: Unchanged hepatic cyst in the right hepatic lobe. Normal gallbladder. No biliary dilation. Pancreas: Unremarkable. Spleen: Unremarkable. Adrenals/Urinary Tract: Stable adrenal glands. 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 No urinary calculi or hydronephrosis. Unremarkable bladder. Stomach/Bowel: Stomach is within normal limits. No bowel obstruction or bowel wall thickening. Colonic diverticulosis without diverticulitis. The appendix is not visualized.Stomach is within normal limits. Vascular/Lymphatic: No significant vascular findings are present. No enlarged abdominal or pelvic lymph nodes. Reproductive: Unremarkable. Other: No free intraperitoneal fluid or air. Musculoskeletal: No acute  fracture. IMPRESSION: 1. No acute abnormality in the abdomen or pelvis. 2. 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 No urinary calculi or hydronephrosis. 3. Colonic diverticulosis without diverticulitis. Electronically Signed   By: Minerva Fester M.D.   On: 09/19/2023 22:30    Procedures Procedures    Medications Ordered in ED Medications  acetaminophen (TYLENOL) tablet 650 mg (has no administration in time range)    ED Course/ Medical Decision Making/ A&P                                 Medical Decision Making Amount and/or Complexity of Data Reviewed Independent Historian: caregiver Labs:  ordered. Decision-making details documented in ED Course. Radiology: ordered and independent interpretation performed. Decision-making details documented in ED Course. ECG/medicine tests: ordered and independent interpretation performed. Decision-making details documented in ED Course.  Risk OTC drugs. Prescription drug management. Decision regarding hospitalization.  Several weeks of ongoing left-sided low back pain with known ureteral stricture and elevated creatinine.  No acute change today.  No new weakness, numbness or tingling, no bowel or bladder incontinence.  Low suspicion for cord compression or cauda equina.  Creatinine stable compared to October 28.  CT findings reviewed with Dr. Ronne Binning of urology.  He agrees that ureteral stricture is unlikely responsible for patient's pain.  May be responsible her for her creatinine elevation but this will be a chronic process.  Recommends outpatient follow-up.  Patient hypertensive.  States she was in pain.  She did take her amlodipine this morning.  She is given a repeat dose as well as some IV hydralazine.  Denies chest pain or shortness of breath.  Her CT scan today is stable compared to October 28.  Doubt this is causing her low back pain and flank pain. Her  elevated creatinine may be her new baseline as she has not had any values checked in the past 1 year.  Given her ongoing pain, MRI is obtained to assess for lumbar radicular component of pain.  Will also obtain brain MRI given daughter's concern for unsteady gait and dizziness.  MRI shows no acute infarct.  Does show chronic microhemorrhages and parotid nodule that patient and daughter made aware of need for follow-up.  MRI lumbar spine with multilevel degenerative changes and foraminal stenosis.  Also with spinal stenosis centrally Low back pain may be radicular in origin.  Risks and Benefits of steroids discussed with patient and daughter.  They agree to p.o. steroids as she has tolerated them well in the past for her COPD.  She has done poorly with steroid injections in the past  Daughter concerned about patient's ambulation at home.  She is quite unsteady on her feet.  Given her ongoing pain would not be unreasonable to observe her overnight for pain control, steroids, PT evaluation and hydration to see if her creatinine improves.  Discussed with Dr. Janalyn Shy who will evaluate for admission.       Final Clinical Impression(s) / ED Diagnoses Final diagnoses:  Acute left-sided low back pain without sciatica  Ureteral stricture  Hypertensive urgency    Rx / DC Orders ED Discharge Orders     None         Sarah Baez, Jeannett Senior, MD 09/20/23 [id number]

## 2023-09-20 NOTE — Evaluation (Signed)
Physical Therapy Evaluation Patient Details Name: [name] [name] MRN: [id number] DOB: 1941/04/26 Today's Date: 09/20/2023  History of Present Illness  82 yo presented to ED 11/8 with abdominal pain. Abdominal CT stable, MRI brain negative, MRI lumbar spine showing stenosis. PMhx: COPD, HTN, HLD, bipolar disorder, GERD, hiatal hernia, dementia  Clinical Impression  Pt pleasant, confused and demonstrating decreased awareness of safety and function. Pt admittedly furniture walking at home with falls typically related to bathroom use. Pt lives with family who can provide support and will benefit from continued RW use with gait to decrease fall risk and OPPT to maximize function. Pt with decreased gait speed and activity tolerance who will benefit from acute therapy to maximize safety.         If plan is discharge home, recommend the following: A little help with walking and/or transfers;Assistance with cooking/housework;A little help with bathing/dressing/bathroom;Help with stairs or ramp for entrance;Assist for transportation;Supervision due to cognitive status;Direct supervision/assist for medications management;Direct supervision/assist for financial management   Can travel by private vehicle        Equipment Recommendations Rolling walker (2 wheels)  Recommendations for Other Services       Functional Status Assessment Patient has had a recent decline in their functional status and/or demonstrates limited ability to make significant improvements in function in a reasonable and predictable amount of time     Precautions / Restrictions Precautions Precautions: Fall      Mobility  Bed Mobility               General bed mobility comments: standing on arrival    Transfers Overall transfer level: Needs assistance   Transfers: Sit to/from Stand Sit to Stand: Supervision           General transfer comment: supervision for safety and cues for direction     Ambulation/Gait Ambulation/Gait assistance: Supervision Gait Distance (Feet): 700 Feet Assistive device: Rolling walker (2 wheels) Gait Pattern/deviations: Step-through pattern, Decreased stride length, Shuffle Gait velocity: 12'/13 sec=.39ft/sec Gait velocity interpretation: <1.31 ft/sec, indicative of household ambulator   General Gait Details: pt with slow shuffling steps, initiated gait without UB support with pt leaning into wall to right, reaching for environment and demonstrating impaired balance. With RW pt with steady gait with continued shuffle with pt noting improved balance and agreeable to RW use  Stairs Stairs: Yes Stairs assistance: Supervision Stair Management: Step to pattern, Forwards, One rail Left Number of Stairs: 8 General stair comments: pt completed ascending 8 stairs then sat on steps to initiate descent and needed cues to step down rather than scoot down stairs  Wheelchair Mobility     Tilt Bed    Modified Rankin (Stroke Patients Only)       Balance Overall balance assessment: Needs assistance, History of Falls   Sitting balance-Leahy Scale: Fair     Standing balance support: Bilateral upper extremity supported Standing balance-Leahy Scale: Poor Standing balance comment: able to static stand briefly without assist, RW for gait                             Pertinent Vitals/Pain Pain Assessment Pain Assessment: No/denies pain    Home Living Family/patient expects to be discharged to:: Private residence Living Arrangements: Children Available Help at Discharge: Family;Available 24 hours/day Type of Home: House Home Access: Stairs to enter   [employer] of Steps: 1   Home Layout: Two level;Bed/bath upstairs Home Equipment: Cane - single  point;Shower seat      Prior Function Prior Level of Function : Independent/Modified Independent;Needs assist             Mobility Comments: pt walking with cane at baseline  with increased instability and bumping into walls the few weeks PTA. pt with at least 4 falls in the last 6 month ADLs Comments: family performing IADLs, supervision at times for ADLs     Extremity/Trunk Assessment   Upper Extremity Assessment Upper Extremity Assessment: Generalized weakness    Lower Extremity Assessment Lower Extremity Assessment: Generalized weakness    Cervical / Trunk Assessment Cervical / Trunk Assessment: Normal  Communication   Communication Communication: No apparent difficulties  Cognition Arousal: Alert Behavior During Therapy: Flat affect Overall Cognitive Status: History of cognitive impairments - at baseline Area of Impairment: Memory, Safety/judgement                     Memory: Decreased short-term memory   Safety/Judgement: Decreased awareness of safety, Decreased awareness of deficits              General Comments      Exercises     Assessment/Plan    PT Assessment Patient needs continued PT services  PT Problem List Decreased strength;Decreased mobility;Decreased activity tolerance;Decreased cognition;Decreased balance;Decreased knowledge of use of DME       PT Treatment Interventions DME instruction;Gait training;Functional mobility training;Therapeutic activities;Patient/family education;Cognitive remediation;Balance training;Therapeutic exercise    PT Goals (Current goals can be found in the Care Plan section)  Acute Rehab PT Goals Patient Stated Goal: decrease falls PT Goal Formulation: With family Time For Goal Achievement: 10/04/23 Potential to Achieve Goals: Fair    Frequency Min 1X/week     Co-evaluation               AM-PAC PT "6 Clicks" Mobility  Outcome Measure Help needed turning from your back to your side while in a flat bed without using bedrails?: A Little Help needed moving from lying on your back to sitting on the side of a flat bed without using bedrails?: A Little Help needed moving  to and from a bed to a chair (including a wheelchair)?: A Little Help needed standing up from a chair using your arms (e.g., wheelchair or bedside chair)?: A Little Help needed to walk in hospital room?: A Little Help needed climbing 3-5 steps with a railing? : A Little 6 Click Score: 18    End of Session Equipment Utilized During Treatment: Gait belt Activity Tolerance: Patient tolerated treatment well Patient left: in bed;with call bell/phone within reach;with family/visitor present Nurse Communication: Mobility status PT Visit Diagnosis: Other abnormalities of gait and mobility (R26.89);History of falling (Z91.81)    Time: 1610-9604 PT Time Calculation (min) (ACUTE ONLY): 27 min   Charges:   PT Evaluation $PT Eval Low Complexity: 1 Low PT Treatments $Gait Training: 8-22 mins PT General Charges $$ ACUTE PT VISIT: 1 Visit         Merryl Hacker, PT Acute Rehabilitation Services Office: [phone]   Yisell Sprunger B Teren Zurcher 09/20/2023, 10:26 AM

## 2023-09-20 NOTE — Hospital Course (Signed)
83 year old female history of chronic abdominal pain, generalized anxiety disorder, asthma, and essential hypertension presented to emergency department for complaining of flank pain and abdominal pain.  Patient reported she is having abdominal pain for a while but it has been getting worse.  Patient denies any nausea, vomiting, constipation and diarrhea.  Have normal bowel movement on daily basis.  At presentation to ED patient found to have elevated blood pressure 236/92, heart rate between 63-70, O2 sat 100% room air and respiratory 21. Elevated troponin 26 and 25. Normal lipase level 45. CBC unremarkable with stable H&H 10.4 and 35. CMP unremarkable except low bicarb 20, elevated BUN 47, creatinine 2.25 (baseline creatinine 1.1-1.2). CT abdomen pelvis showed no acute abnormality of abdomen and pelvis.  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 No urinary calculi or hydronephrosis.  Colonic diverticulosis without diverticulitis.  -ED physician discussed case with on-call urology as a CT abdomen pelvis showed a large renal pelvis with abrupt caliber change which might concern for ureteral stricture.  Urology Dr. Ronne Binning recommended follow-up with outpatient and given patient has progressively increasing creatinine recommended to treat with IV fluid and patient is to follow-up with urology outpatient on DC. - Given patient continues to complaining about back pain MRI lumbar spine was ordered which showed grade 1 spondylolisthesis at L5 A7 level and lumbar spine degeneration at multiple level, frequently moderate neural foraminal stenosis.  Patient's daughter is very concerned about the left-sided flank pain, abdominal pain and back pain.  Dr. Manus Gunning plan to start IV fluid for management of AKI, steroid for lumbar spondylolisthesis and hospitalist has been contacted for further management of AKI,  abdominal/lumbar/left-sided flank pain. -Patient will be benefit from inpatient PT and OT evaluation.

## 2023-09-21 DIAGNOSIS — R109 Unspecified abdominal pain: Secondary | ICD-10-CM

## 2023-09-21 LAB — CBC
HCT: 29.9 % — ABNORMAL LOW (ref 36.0–46.0)
Hemoglobin: 9.2 g/dL — ABNORMAL LOW (ref 12.0–15.0)
MCH: 28.4 pg (ref 26.0–34.0)
MCHC: 30.8 g/dL (ref 30.0–36.0)
MCV: 92.3 fL (ref 80.0–100.0)
Platelets: 211 10*3/uL (ref 150–400)
RBC: 3.24 MIL/uL — ABNORMAL LOW (ref 3.87–5.11)
RDW: 14.3 % (ref 11.5–15.5)
WBC: 7.5 10*3/uL (ref 4.0–10.5)
nRBC: 0 % (ref 0.0–0.2)

## 2023-09-21 LAB — BASIC METABOLIC PANEL
Anion gap: 3 — ABNORMAL LOW (ref 5–15)
BUN: 40 mg/dL — ABNORMAL HIGH (ref 8–23)
CO2: 24 mmol/L (ref 22–32)
Calcium: 10 mg/dL (ref 8.9–10.3)
Chloride: 113 mmol/L — ABNORMAL HIGH (ref 98–111)
Creatinine, Ser: 2.35 mg/dL — ABNORMAL HIGH (ref 0.44–1.00)
GFR, Estimated: 20 mL/min — ABNORMAL LOW (ref >60)
Glucose, Bld: 94 mg/dL (ref 70–99)
Potassium: 4.6 mmol/L (ref 3.5–5.1)
Sodium: 140 mmol/L (ref 135–145)

## 2023-09-21 NOTE — Progress Notes (Signed)
PROGRESS NOTE    MARIELL [name]  WGN:[id number] DOB: 10-09-1941 DOA: 09/19/2023 PCP: Myrlene Broker, MD   Brief Narrative:  This 82 y.o. female with medical history significant of hypertension, hyperlipidemia, asthma/COPD, H. pylori, GERD, bipolar 1 disorder, and anxiety who presents with complaints of left flank pain. Patient has intermittent confusion which was thought to be secondary to dementia.  She has followed up with psychiatry for psychosis and several changes has been made to her medications. Patient has followed up with primary care physician who has ordered CT chest abdomen pelvis which showed findings suggestive of ureteropelvic stricture. She was seen in the urgent care and found to have a UTI. She was sent in the ED for further evaluation.  Case was discussed with urology recommended outpatient follow-up.  Patient is admitted and started on antibiotics.  Assessment & Plan:   Principal Problem:   Left flank pain Active Problems:   UTI (urinary tract infection)   AKI (acute kidney injury) (HCC)   Hypertensive urgency   Generalized weakness   Failure to thrive in adult   Anemia of chronic disease   Hypercalcemia   COPD with acute exacerbation (HCC)   Spinal stenosis   Bipolar disorder with psychotic features (HCC)   Mass of right parotid gland   GERD (gastroesophageal reflux disease)  Left flank pain, multifactorial: Likely secondary to UTI and UPJ stricture: Present on admission.  Patient presents with complaints of left flank pain over the last couple weeks.   Treated for urinary tract infection with Macrobid without improvement in symptoms.   Urinalysis + , CT A/P changes around UPJ suggestive of possible stricture.   Other cause for left flank pain could be lumbar spinal stenosis. Continue IV ceftriaxone. Follow-up urine culture Hydrocodone as needed for pain.   Acute kidney injury: She presented with serum creatinine 2.25, baseline creatinine 1.13. Patient  reports poor p.o. intake which is likely a factor. Monitor intake and output Avoid nephrotoxic agents Continue IV fluids. Monitor renal functions.   Hypertensive urgency: > Improving BP on arrival 236/92.  Continue amlodipine Hydralazine IV as needed for elevated blood pressures   Generalized weakness: Failure to thrive: Patient reportedly had been weak and seemed to be off balance at home.   Denies any recent fall in the last month.  She reports 30 pounds weight loss over the last year with decreased p.o. intake TSH within normal limits Continue Boost shakes inbetween meals PT to eval and treat   Hypercalcemia: Patient not on any calcium supplements.   Patient noted to have a right parotid gland 1.7 cm unclear if this is correlated. Continue IV fluids Serum calcium normalized.   COPD exacerbation: Patient noted to have expiratory wheezes on physical exam.   Continue Dulera inhaler and Singulair Levalbuterol nebs as needed for shortness of breath/wheezing   Dextroconvex lumbar scoliosis : Spinal stenosis  MRI of the lumbar spine noted dextroconvex lumbar scoliosis . Severe neural foraminal stenosis at the left L1, left L3, and right L4 nerve levels. Patient had been given prednisone 60 mg p.o. to see if this helped as it was thought possibly related with patient's complaints of left-sided flank pain.Continue trial of prednisone Patient likely needs referral to neurosurgery in the outpatient setting.   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   IR consulted in regards to possible biopsy of mass.  IR recommended outpatient biopsy.   GERD: Continue Protonix    DVT prophylaxis: Lovenox Code Status: Full code Family Communication: Daughter at  bedside Disposition Plan:   Status is: Inpatient Remains inpatient appropriate because: Admitted for left flank pain found to have UTI and ureteropelvic stricture.  Urology recommended outpatient management.  Continue antibiotics    Consultants:  Urology  Procedures: CTA/P  Antimicrobials:  Anti-infectives (From admission, onward)    Start     Dose/Rate Route Frequency Ordered Stop   09/20/23 1200  cefTRIAXone (ROCEPHIN) 1 g in sodium chloride 0.9 % 100 mL IVPB        1 g 200 mL/hr over 30 Minutes Intravenous Every 24 hours 09/20/23 1113        Subjective: Patient was seen and examined at bedside.  Overnight events noted.   She was sitting comfortably on the edge of the bed,  having breakfast.  She reports still has left flank pain but improving.  Objective: Vitals:   09/21/23 0726 09/21/23 0829 09/21/23 0836 09/21/23 0900  BP:  (!) 198/85 (!) 198/85 (!) 167/81  Pulse:  (!) 53  64  Resp:  18    Temp:  97.7 F (36.5 C)    TempSrc:  Oral    SpO2: 97% 100%    Weight:      Height:        Intake/Output Summary (Last 24 hours) at 09/21/2023 1055 Last data filed at 09/21/2023 1024 Gross per 24 hour  Intake 1459.14 ml  Output 1 ml  Net 1458.14 ml   Filed Weights   09/19/23 1832  Weight: 50.8 kg    Examination:  General exam: Appears calm and comfortable, not in any acute distress. Respiratory system: Clear to auscultation. Respiratory effort normal.  RR 15 Cardiovascular system: S1 & S2 heard, RRR. No JVD, murmurs, rubs, gallops or clicks. No pedal edema. Gastrointestinal system: Abdomen is non distended, soft and non tender.  Normal bowel sounds heard. Back: Left flank tenderness noted Central nervous system: Alert and oriented x 3. No focal neurological deficits. Extremities: No edema, no cyanosis, no clubbing Skin: No rashes, lesions or ulcers Psychiatry: Judgement and insight appear normal. Mood & affect appropriate.     Data Reviewed: I have personally  reviewed following labs and imaging studies  CBC: Recent Labs  Lab 09/19/23 1901 09/21/23 0516  WBC 8.2 7.5  NEUTROABS 3.8  --   HGB 10.4* 9.2*  HCT 35.0* 29.9*  MCV 91.9 92.3  PLT 252 211   Basic Metabolic Panel: Recent Labs  Lab 09/17/23 0940 09/19/23 1901 09/21/23 0516  NA 136 137 140  K 4.2 4.4 4.6  CL 105 108 113*  CO2 24 20* 24  GLUCOSE 109* 106* 94  BUN 38* 47* 40*  CREATININE 2.18* 2.25* 2.35*  CALCIUM 11.2* 10.9* 10.0  PHOS 3.3  --   --    GFR: Estimated Creatinine Clearance: 15.3 mL/min (A) (by C-G formula based on SCr of 2.35 mg/dL (H)). Liver Function Tests: Recent Labs  Lab 09/17/23 0940 09/19/23 1901  AST  --  19  ALT  --  10  ALKPHOS  --  60  BILITOT  --  0.4  PROT  --  7.6  ALBUMIN 3.9 3.6   Recent Labs  Lab 09/19/23 1901  LIPASE 45   No results for input(s): "AMMONIA" in the last 168 hours. Coagulation Profile: No results for input(s): "  INR", "PROTIME" in the last 168 hours. Cardiac Enzymes: No results for input(s): "CKTOTAL", "CKMB", "CKMBINDEX", "TROPONINI" in the last 168 hours. BNP (last 3 results) No results for input(s): "PROBNP" in the last 8760 hours. HbA1C: No results for input(s): "HGBA1C" in the last 72 hours. CBG: No results for input(s): "GLUCAP" in the last 168 hours. Lipid Profile: No results for input(s): "CHOL", "HDL", "LDLCALC", "TRIG", "CHOLHDL", "LDLDIRECT" in the last 72 hours. Thyroid Function Tests: No results for input(s): "TSH", "T4TOTAL", "FREET4", "T3FREE", "THYROIDAB" in the last 72 hours. Anemia Panel: No results for input(s): "VITAMINB12", "FOLATE", "FERRITIN", "TIBC", "IRON", "RETICCTPCT" in the last 72 hours. Sepsis Labs: No results for input(s): "PROCALCITON", "LATICACIDVEN" in the last 168 hours.  No results found for this or any previous visit (from the past 240 hour(s)).   Radiology Studies: MR LUMBAR SPINE WO CONTRAST  Result Date: 09/20/2023 CLINICAL DATA:  82 year old female with memory  loss, headache, left flank, back pain. EXAM: MRI LUMBAR SPINE WITHOUT CONTRAST TECHNIQUE: Multiplanar, multisequence MR imaging of the lumbar spine was performed. No intravenous contrast was administered. COMPARISON:  CT Abdomen and Pelvis yesterday. FINDINGS: Segmentation:  Normal. Alignment: Lumbar lordosis superimposed on up to moderate dextroconvex lumbar scoliosis. Grade 1 spondylolisthesis of L5 on S1. Subtle retrolisthesis of L2 on L3. Vertebrae: Background bone marrow signal is within normal limits. Preserved vertebral body height. Intact visible sacrum and SI joints. No marrow edema or evidence of acute osseous abnormality. Conus medullaris and cauda equina: Conus extends to the L1-L2 level. No lower spinal cord or conus signal abnormality. Paraspinal and other soft tissues: Stable from the CT Abdomen and Pelvis yesterday. Negative visualized posterior paraspinal soft tissues. Disc levels: Visible lower thoracic spine degeneration with no convincing lower thoracic spinal stenosis through T12-L1. L1-L2: Asymmetric disc bulging mostly affecting the neural foramina. Facet hypertrophy on the left is mild-to-moderate. No spinal stenosis. Moderate to severe left L1 foraminal stenosis. L2-L3: More circumferential disc bulge. Mild to moderate bilateral facet and ligament flavum hypertrophy. Borderline to mild spinal stenosis. Moderate bilateral L2 foraminal stenosis. L3-L4: Circumferential disc bulge. Broad-based posterior and foraminal involvement. Mild to moderate facet and ligament flavum hypertrophy. Mild spinal stenosis. Little lateral recess stenosis but moderate to severe left and moderate right L3 foraminal stenosis. L4-L5: Asymmetric disc bulging and endplate spurring to the right. Mild to moderate facet and ligament flavum hypertrophy. No spinal stenosis. Mild right lateral recess stenosis (right L5 nerve level). Mild left L4 foraminal stenosis. Moderate to severe right L4 foraminal stenosis. L5-S1: Grade  1 anterolisthesis. Circumferential disc/pseudo disc. Moderate to severe facet hypertrophy greater on the right. No spinal or lateral recess stenosis. Mild left and moderate right L5 foraminal stenosis. IMPRESSION: 1. Dextroconvex lumbar scoliosis with grade 1 spondylolisthesis at L5-S1. No acute osseous abnormality. 2. 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 Associated up to Severe neural foraminal stenosis at the left L1, left L3, and right L4 nerve levels. Frequently moderate neural foraminal stenosis at the remaining levels. Electronically Signed   By: Odessa Fleming M.D.   On: 09/20/2023 05:42   MR BRAIN WO CONTRAST  Result Date: 09/20/2023 CLINICAL DATA:  82 year old female with memory loss, headache, left flank, back pain. EXAM: MRI HEAD WITHOUT CONTRAST TECHNIQUE: Multiplanar, multiecho pulse sequences of the brain and surrounding structures were obtained without intravenous contrast. COMPARISON:  Head CT 09/11/2023. FINDINGS: Brain: No restricted diffusion to suggest acute infarction. No midline shift, mass effect, evidence of mass lesion, ventriculomegaly,  extra-axial collection or acute intracranial hemorrhage. Cervicomedullary junction and pituitary are within normal limits. Normal cerebral volume for age. Axial FLAIR series motion artifact. Mild for age scattered cerebral white matter T2 and FLAIR hyperintensity, similar heterogeneity in the bilateral deep gray nuclei. No cortical encephalomalacia identified. There is a chronic microhemorrhage in the left thalamus series 12, image 34. And there is a larger area of chronic hemorrhage in the posterior left corona radiata. Subtle encephalomalacia there by CT (series 12, image 36 on SWI). No other chronic cerebral blood products. Brainstem and cerebellum appear negative. Vascular: Major intracranial vascular flow voids are preserved. Skull and upper cervical spine: Normal for age visible cervical spine.  Visualized bone marrow signal is within normal limits. Sinuses/Orbits: Postoperative changes to both globes. Chronic paranasal sinus mucosal thickening, fluid levels. Other: Mastoids are clear. Grossly normal visible internal auditory structures. 17 mm oval T2 hypointense soft tissue nodule in the right parotid gland with mildly abnormal diffusion. IMPRESSION: 1. No acute intracranial abnormality. 2. 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 3. 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 4. Acute on chronic paranasal sinus inflammation. Electronically Signed   By: Odessa Fleming M.D.   On: 09/20/2023 05:37   CT ABDOMEN PELVIS WO CONTRAST  Result Date: 09/19/2023 CLINICAL DATA:  Left flank pain EXAM: CT ABDOMEN AND PELVIS WITHOUT CONTRAST TECHNIQUE: Multidetector CT imaging of the abdomen and pelvis was performed following the standard protocol without IV contrast. RADIATION DOSE REDUCTION: This exam was performed according to the departmental dose-optimization program which includes automated exposure control, adjustment of the mA and/or kV according to patient size and/or use of iterative reconstruction technique. COMPARISON:  CT abdomen and pelvis 09/11/2023 FINDINGS: Lower chest: No acute abnormality. Hepatobiliary: Unchanged hepatic cyst in the right hepatic lobe. Normal gallbladder. No biliary dilation. Pancreas: Unremarkable. Spleen: Unremarkable. Adrenals/Urinary Tract: Stable adrenal glands. 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 No urinary calculi or hydronephrosis. Unremarkable bladder. Stomach/Bowel: Stomach is within normal limits. No bowel obstruction or bowel  wall thickening. Colonic diverticulosis without diverticulitis. The appendix is not visualized.Stomach is within normal limits. Vascular/Lymphatic: No significant vascular findings are present. No enlarged abdominal or pelvic lymph nodes. Reproductive: Unremarkable. Other: No free intraperitoneal fluid or air. Musculoskeletal: No acute fracture. IMPRESSION: 1. No acute abnormality in the abdomen or pelvis. 2. 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 No urinary calculi or hydronephrosis. 3. Colonic diverticulosis without diverticulitis. Electronically Signed   By: Minerva Fester M.D.   On: 09/19/2023 22:30    Scheduled Meds:  amLODipine  10 mg Oral Daily   arformoterol  15 mcg Nebulization BID   budesonide (PULMICORT) nebulizer solution  0.5 mg Nebulization BID   divalproex  250 mg Oral QHS   feeding supplement  1 Container Oral TID BM   heparin  5,000 Units Subcutaneous Q8H   montelukast  10 mg Oral QHS   olopatadine  1 drop Both Eyes Daily   pantoprazole  40 mg Oral Daily   predniSONE  40 mg Oral Q breakfast   QUEtiapine  100 mg Oral QHS   sodium chloride flush  3 mL Intravenous Q12H  Continuous Infusions:  cefTRIAXone (ROCEPHIN)  IV Stopped (09/20/23 1407)     LOS: 1 day    Time spent: 50 mins    Willeen Niece, MD [facility]   If 7PM-7AM, please contact night-coverage

## 2023-09-21 NOTE — Progress Notes (Signed)
Interventional Radiology Brief Note:  Patient incidentally found to a 1.7cm soft tissue nodule while undergoing work up for flank pain. Recommend this be performed as an outpatient. Inpatient order cancelled and primary team notified. Order can be replaced at discharge or another option would be to facilitate ENT or other referral.  Loyce Dys, MS RD PA-C 9:26 AM

## 2023-09-21 NOTE — Plan of Care (Signed)

## 2023-09-22 ENCOUNTER — Inpatient Hospital Stay (HOSPITAL_COMMUNITY): Payer: Medicare Other

## 2023-09-22 DIAGNOSIS — E43 Unspecified severe protein-calorie malnutrition: Secondary | ICD-10-CM | POA: Insufficient documentation

## 2023-09-22 DIAGNOSIS — R109 Unspecified abdominal pain: Secondary | ICD-10-CM | POA: Diagnosis not present

## 2023-09-22 HISTORY — DX: Unspecified severe protein-calorie malnutrition: E43

## 2023-09-22 MED ORDER — ENSURE ENLIVE PO LIQD
237.0000 mL | Freq: Two times a day (BID) | ORAL | Status: DC
  Administered 2023-09-22 – 2023-09-23 (×3): 237 mL via ORAL

## 2023-09-22 MED ORDER — HYDRALAZINE HCL 25 MG PO TABS
25.0000 mg | ORAL_TABLET | Freq: Three times a day (TID) | ORAL | Status: DC
  Administered 2023-09-22 – 2023-09-23 (×3): 25 mg via ORAL
  Filled 2023-09-22 (×3): qty 1

## 2023-09-22 MED ORDER — ADULT MULTIVITAMIN W/MINERALS CH
1.0000 | ORAL_TABLET | Freq: Every day | ORAL | Status: DC
  Administered 2023-09-22: 1 via ORAL
  Filled 2023-09-22 (×2): qty 1

## 2023-09-22 NOTE — Telephone Encounter (Signed)
Send this to triage?

## 2023-09-22 NOTE — Progress Notes (Signed)
PROGRESS NOTE    [name] [name]  ZOX:[id number] DOB: 02/27/1941 DOA: 09/19/2023 PCP: Myrlene Broker, MD   Brief Narrative:  This 82 y.o. female with medical history significant of hypertension, hyperlipidemia, asthma/COPD, H. pylori, GERD, bipolar 1 disorder, and anxiety who presents with complaints of left flank pain. Patient has intermittent confusion which was thought to be secondary to dementia.  She has followed up with psychiatry for psychosis and several changes has been made to her medications. Patient has followed up with primary care physician who has ordered CT chest abdomen pelvis which showed findings suggestive of ureteropelvic stricture. She was seen in the urgent care and found to have a UTI. She was sent in the ED for further evaluation.  Case was discussed with urology recommended outpatient follow-up.  Patient is admitted and started on antibiotics.  Assessment & Plan:   Principal Problem:   Left flank pain Active Problems:   UTI (urinary tract infection)   AKI (acute kidney injury) (HCC)   Hypertensive urgency   Generalized weakness   Failure to thrive in adult   Anemia of chronic disease   Hypercalcemia   COPD with acute exacerbation (HCC)   Spinal stenosis   Bipolar disorder with psychotic features (HCC)   Mass of right parotid gland   GERD (gastroesophageal reflux disease)   Protein-calorie malnutrition, severe  Left flank pain, multifactorial: Likely secondary to UTI and UPJ stricture: Present on admission.  Patient presents with complaints of left flank pain over the last couple weeks.   Treated for urinary tract infection with Macrobid without improvement in symptoms.   Urinalysis + , CT A/P changes around UPJ suggestive of possible stricture.   Other cause for left flank pain could be lumbar spinal stenosis. Continue IV ceftriaxone. Urine cultures not sent Hydrocodone as needed for pain.   Acute kidney injury: She presented with serum creatinine  2.25, baseline creatinine 1.13. Patient reports poor p.o. intake which is likely a factor. Monitor intake and output Avoid nephrotoxic agents Continue IV fluids. Monitor renal functions.   Hypertensive urgency: > Improving BP on arrival 236/92.  Continue amlodipine Hydralazine IV as needed for elevated blood pressures. Added hydralazine 25 mg 3 times daily for better blood pressure control.   Generalized weakness: Failure to thrive: Patient reportedly had been weak and seemed to be off balance at home.   Denies any recent fall in the last month.   She reports 30 pounds weight loss over the last year with decreased p.o. intake TSH within normal limits Continue Boost shakes inbetween meals PT to eval and treat   Hypercalcemia: Patient not on any calcium supplements.   Patient noted to have a right parotid gland 1.7 cm unclear if this is correlated. Continue IV fluids Serum calcium normalized.   COPD exacerbation: Patient noted to have expiratory wheezes on physical exam.   Continue Dulera inhaler and Singulair Levalbuterol nebs as needed for shortness of breath/wheezing   Dextroconvex lumbar scoliosis : Spinal stenosis  MRI of the lumbar spine noted dextroconvex lumbar scoliosis . Severe neural foraminal stenosis at the left L1, left L3, and right L4 nerve levels. Patient had been given prednisone 60 mg p.o. to see if this helped as it was thought possibly related with patient's complaints of left-sided flank pain.Continue trial of prednisone Patient likely needs referral to neurosurgery in the outpatient setting.   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   IR consulted in regards to possible biopsy of mass.  IR recommended outpatient biopsy.    GERD: Continue Protonix    DVT prophylaxis: Lovenox Code Status: Full code Family Communication: Daughter at bedside Disposition Plan:   Status is: Inpatient Remains inpatient appropriate because: Admitted for left flank pain found to have UTI and ureteropelvic stricture.  Urology recommended outpatient management.  Continue antibiotics    Consultants:  Urology  Procedures: CTA/P  Antimicrobials:  Anti-infectives (From admission, onward)    Start     Dose/Rate Route Frequency Ordered Stop   09/20/23 1200  cefTRIAXone (ROCEPHIN) 1 g in sodium chloride 0.9 % 100 mL IVPB        1 g 200 mL/hr over 30 Minutes Intravenous Every 24 hours 09/20/23 1113        Subjective: Patient was seen and examined at bedside.  Overnight events noted. Patient reports doing better,  She asked when she would be discharged.  She still having left flank pain.  Objective: Vitals:   09/22/23 1048 09/22/23 1051 09/22/23 1057 09/22/23 1141  BP: (!) 207/91 (!) 207/91 (!) 207/91 (!) 163/68  Pulse: 66   69  Resp:      Temp:      TempSrc:      SpO2:      Weight:      Height:       No intake or output data in the 24 hours ending 09/22/23 1150  Filed Weights   09/19/23 1832  Weight: 50.8 kg    Examination:  General exam: Appears comfortable, calm, not in any acute distress. Respiratory system: CTA bilaterally. Respiratory effort normal.  RR 14 Cardiovascular system: S1 & S2 heard, RRR. No JVD, murmurs, rubs, gallops or clicks. No pedal edema. Gastrointestinal system: Abdomen is non distended, soft and non tender.  Normal bowel sounds heard. Back: Left flank tenderness noted Central nervous system: Alert and oriented x 3. No focal neurological deficits. Extremities: No edema, no cyanosis, no clubbing Skin: No rashes, lesions or ulcers Psychiatry: Judgement and insight appear normal. Mood & affect appropriate.     Data Reviewed: I have personally reviewed following labs and imaging  studies  CBC: Recent Labs  Lab 09/19/23 1901 09/21/23 0516  WBC 8.2 7.5  NEUTROABS 3.8  --   HGB 10.4* 9.2*  HCT 35.0* 29.9*  MCV 91.9 92.3  PLT 252 211   Basic Metabolic Panel: Recent Labs  Lab 09/17/23 0940 09/19/23 1901 09/21/23 0516  NA 136 137 140  K 4.2 4.4 4.6  CL 105 108 113*  CO2 24 20* 24  GLUCOSE 109* 106* 94  BUN 38* 47* 40*  CREATININE 2.18* 2.25* 2.35*  CALCIUM 11.2* 10.9* 10.0  PHOS 3.3  --   --    GFR: Estimated Creatinine Clearance: 15.3 mL/min (A) (by C-G formula based on SCr of 2.35 mg/dL (H)). Liver Function Tests: Recent Labs  Lab 09/17/23 0940 09/19/23 1901  AST  --  19  ALT  --  10  ALKPHOS  --  60  BILITOT  --  0.4  PROT  --  7.6  ALBUMIN 3.9 3.6   Recent Labs  Lab 09/19/23 1901  LIPASE 45   No results for input(s): "AMMONIA" in the last 168 hours. Coagulation Profile: No results for input(s): "INR", "PROTIME" in the last 168 hours. Cardiac Enzymes: No results for input(s): "CKTOTAL", "  CKMB", "CKMBINDEX", "TROPONINI" in the last 168 hours. BNP (last 3 results) No results for input(s): "PROBNP" in the last 8760 hours. HbA1C: No results for input(s): "HGBA1C" in the last 72 hours. CBG: No results for input(s): "GLUCAP" in the last 168 hours. Lipid Profile: No results for input(s): "CHOL", "HDL", "LDLCALC", "TRIG", "CHOLHDL", "LDLDIRECT" in the last 72 hours. Thyroid Function Tests: No results for input(s): "TSH", "T4TOTAL", "FREET4", "T3FREE", "THYROIDAB" in the last 72 hours. Anemia Panel: No results for input(s): "VITAMINB12", "FOLATE", "FERRITIN", "TIBC", "IRON", "RETICCTPCT" in the last 72 hours. Sepsis Labs: No results for input(s): "PROCALCITON", "LATICACIDVEN" in the last 168 hours.  No results found for this or any previous visit (from the past 240 hour(s)).   Radiology Studies: No results found.  Scheduled Meds:  amLODipine  10 mg Oral Daily   arformoterol  15 mcg Nebulization BID   budesonide (PULMICORT)  nebulizer solution  0.5 mg Nebulization BID   divalproex  250 mg Oral QHS   feeding supplement  237 mL Oral BID BM   heparin  5,000 Units Subcutaneous Q8H   hydrALAZINE  25 mg Oral Q8H   montelukast  10 mg Oral QHS   multivitamin with minerals  1 tablet Oral Daily   olopatadine  1 drop Both Eyes Daily   pantoprazole  40 mg Oral Daily   predniSONE  40 mg Oral Q breakfast   QUEtiapine  100 mg Oral QHS   sodium chloride flush  3 mL Intravenous Q12H   Continuous Infusions:  cefTRIAXone (ROCEPHIN)  IV 1 g (09/21/23 1203)     LOS: 2 days    Time spent: 35 mins    Willeen Niece, MD [facility]   If 7PM-7AM, please contact night-coverage

## 2023-09-22 NOTE — Progress Notes (Signed)
Physical Therapy Treatment Patient Details Name: [name] [name] MRN: [id number] DOB: 11-30-1940 Today's Date: 09/22/2023   History of Present Illness 82 yo presented to ED 11/8 with abdominal pain. Abdominal CT stable, MRI brain negative, MRI lumbar spine showing stenosis. PMhx: COPD, HTN, HLD, bipolar disorder, GERD, hiatal hernia, dementia    PT Comments  Pt agreeable to participate. Focus of session on progressive dynamic balance with no assistive device and multidirectional stepping. Pt requiring contact guard assist overall. Recommend OPPT for further balance training.   If plan is discharge home, recommend the following: A little help with walking and/or transfers;Assistance with cooking/housework;A little help with bathing/dressing/bathroom;Help with stairs or ramp for entrance;Assist for transportation;Supervision due to cognitive status;Direct supervision/assist for medications management;Direct supervision/assist for financial management   Can travel by private vehicle        Equipment Recommendations  Rolling walker (2 wheels)    Recommendations for Other Services       Precautions / Restrictions Precautions Precautions: Fall Restrictions Weight Bearing Restrictions: No     Mobility  Bed Mobility Overal bed mobility: Independent                  Transfers Overall transfer level: Needs assistance Equipment used: None Transfers: Sit to/from Stand Sit to Stand: Supervision                Ambulation/Gait Ambulation/Gait assistance: Contact guard assist Gait Distance (Feet): 600 Feet Assistive device: None, 1 person hand held assist Gait Pattern/deviations: Step-through pattern, Decreased stride length, Shuffle       General Gait Details: Pt with intermittent HHA for increased external stability. Slowed gait speed, particularly with balance challenge   Stairs             Wheelchair Mobility     Tilt Bed    Modified Rankin (Stroke  Patients Only)       Balance Overall balance assessment: Needs assistance, History of Falls Sitting-balance support: Feet supported Sitting balance-Leahy Scale: Normal     Standing balance support: No upper extremity supported, During functional activity Standing balance-Leahy Scale: Poor Standing balance comment: reliant on at least single UE support                            Cognition Arousal: Alert Behavior During Therapy: WFL for tasks assessed/performed Overall Cognitive Status: History of cognitive impairments - at baseline                                          Exercises Other Exercises Other Exercises: Lateral stepping to R/L x 15 ft in each direction (x2 reps) Other Exercises: Dynamic balance: head turns in all directions, functional reach task at sink    General Comments        Pertinent Vitals/Pain Pain Assessment Pain Assessment: No/denies pain    Home Living                          Prior Function            PT Goals (current goals can now be found in the care plan section) Acute Rehab PT Goals Potential to Achieve Goals: Good Progress towards PT goals: Progressing toward goals    Frequency    Min 1X/week      PT Plan  Co-evaluation              AM-PAC PT "6 Clicks" Mobility   Outcome Measure  Help needed turning from your back to your side while in a flat bed without using bedrails?: None Help needed moving from lying on your back to sitting on the side of a flat bed without using bedrails?: None Help needed moving to and from a bed to a chair (including a wheelchair)?: A Little Help needed standing up from a chair using your arms (e.g., wheelchair or bedside chair)?: A Little Help needed to walk in hospital room?: A Little Help needed climbing 3-5 steps with a railing? : A Little 6 Click Score: 20    End of Session Equipment Utilized During Treatment: Gait belt Activity Tolerance:  Patient tolerated treatment well Patient left: in bed;with call bell/phone within reach;with family/visitor present Nurse Communication: Mobility status PT Visit Diagnosis: Other abnormalities of gait and mobility (R26.89);History of falling (Z91.81)     Time: 1610-9604 PT Time Calculation (min) (ACUTE ONLY): 23 min  Charges:    $Therapeutic Activity: 23-37 mins PT General Charges $$ ACUTE PT VISIT: 1 Visit                     Lillia Pauls, PT, DPT Acute Rehabilitation Services Office [phone]    Norval Morton 09/22/2023, 4:42 PM

## 2023-09-22 NOTE — Progress Notes (Signed)
Initial Nutrition Assessment  DOCUMENTATION CODES:   Severe malnutrition in context of chronic illness  INTERVENTION:  Liberalize diet Discontinue boost Ensure Plus High Protein po BID, each supplement provides 350 kcal and 20 grams of protein. Multivitamin   NUTRITION DIAGNOSIS:   Severe Malnutrition related to chronic illness as evidenced by severe fat depletion, severe muscle depletion, percent weight loss.    GOAL:   Patient will meet greater than or equal to 90% of their needs    MONITOR:   PO intake, Supplement acceptance, Labs, Weight trends  REASON FOR ASSESSMENT:   Malnutrition Screening Tool    ASSESSMENT:    82 y.o. F, Presented to ER from home with complaints of left flank pain. PMH:  hypertension, hyperlipidemia, asthma/COPD, H. pylori, GERD, bipolar 1 disorder, and anxiety, gastroparesis, hiatal hernia, Helicobacter pylori gastritis. Pt noted to have intermittent confusion which is suspected to be secondary to dementia, no current diagnosis of this at this time.  Patient setting on side of bed stated she was very happy to see me that she has not gotten her breakfast yet. Explained I would be very happy with to assist with that. She stated that her daughter had been ordering for her but was not there yet to do so. She firther stated that all she wanted was a chocolate ensure plus which is what she drinks at home.  Stated that the boost was terrible and like drinking syrup.  Daughter came into room while RD in there. Discussed having her be put down with assistance with ordering. She did not seem very agreeable to this. She was upset that her mother did not have a phone in room to order with. Daughter is Charity fundraiser at [employer].   Provided pt with ensure enlive. She was very happy and daughter was ordering meals for her. There is no benefit to excessive dietary restrictions related advanced age, weight loss, and declined oral intake. Patient would benefit better from liberalized  diet to help increased nutrient needs and promote better oral intake.  Admit weight: 50.8 kg Current weight: 50.8 kg  Weight history: 09/19/23 50.8 kg  09/19/23 53.1 kg  09/08/23 53.1 kg  03/19/23 57.5 kg  02/20/23 59.4 kg  12/11/22 66.2 kg      Average Meal Intake: 74-100: 88% intake x 2 recorded meals  Nutritionally Relevant Medications: Scheduled Meds:  amLODipine  10 mg Oral Daily   divalproex  250 mg Oral QHS   feeding supplement  1 Container Oral TID BM   QUEtiapine  100 mg Oral QHS    PRN Meds:.acetaminophen **OR** acetaminophen, hydrALAZINE, HYDROcodone-acetaminophen, hydrOXYzine, levalbuterol, ondansetron **OR** ondansetron (ZOFRAN) IV  Labs Reviewed    NUTRITION - FOCUSED PHYSICAL EXAM:  Flowsheet Row Most Recent Value  Orbital Region Severe depletion  Upper Arm Region Severe depletion  Thoracic and Lumbar Region Moderate depletion  Buccal Region Severe depletion  Temple Region Severe depletion  Clavicle Bone Region Moderate depletion  Clavicle and Acromion Bone Region Moderate depletion  Scapular Bone Region Moderate depletion  Dorsal Hand Moderate depletion  Patellar Region Severe depletion  Anterior Thigh Region Severe depletion  Posterior Calf Region Severe depletion  Edema (RD Assessment) None  Hair Reviewed  Eyes Reviewed  Mouth Reviewed  [Missing teeth]  Skin Reviewed  Nails Reviewed       Diet Order:   Diet Order             Diet Heart Room service appropriate? Yes; Fluid consistency: Thin  Diet effective now  EDUCATION NEEDS:   Not appropriate for education at this time  Skin:  Skin Assessment: Reviewed RN Assessment  Last BM:  09/21/23  Height:   Ht Readings from Last 1 Encounters:  09/19/23 5\' 4"  (1.626 m)    Weight:   Wt Readings from Last 1 Encounters:  09/19/23 50.8 kg    Ideal Body Weight:     BMI:  Body mass index is 19.22 kg/m.  Estimated Nutritional Needs:   Kcal:  1550-1800  kcal/d  Protein:  70-80 g/d  Fluid:  82ml/kcal    Jamelle Haring RDN, LDN Clinical Dietitian  RDN pager # available on Amion

## 2023-09-23 DIAGNOSIS — R109 Unspecified abdominal pain: Secondary | ICD-10-CM | POA: Diagnosis not present

## 2023-09-23 LAB — BASIC METABOLIC PANEL
Anion gap: 7 (ref 5–15)
BUN: 39 mg/dL — ABNORMAL HIGH (ref 8–23)
CO2: 22 mmol/L (ref 22–32)
Calcium: 10.1 mg/dL (ref 8.9–10.3)
Chloride: 112 mmol/L — ABNORMAL HIGH (ref 98–111)
Creatinine, Ser: 2.25 mg/dL — ABNORMAL HIGH (ref 0.44–1.00)
GFR, Estimated: 22 mL/min — ABNORMAL LOW (ref >60)
Glucose, Bld: 86 mg/dL (ref 70–99)
Potassium: 4.2 mmol/L (ref 3.5–5.1)
Sodium: 141 mmol/L (ref 135–145)

## 2023-09-23 LAB — CBC
HCT: 29.4 % — ABNORMAL LOW (ref 36.0–46.0)
Hemoglobin: 9.1 g/dL — ABNORMAL LOW (ref 12.0–15.0)
MCH: 28.2 pg (ref 26.0–34.0)
MCHC: 31 g/dL (ref 30.0–36.0)
MCV: 91 fL (ref 80.0–100.0)
Platelets: 223 10*3/uL (ref 150–400)
RBC: 3.23 MIL/uL — ABNORMAL LOW (ref 3.87–5.11)
RDW: 14.3 % (ref 11.5–15.5)
WBC: 8 10*3/uL (ref 4.0–10.5)
nRBC: 0 % (ref 0.0–0.2)

## 2023-09-23 MED ORDER — CEPHALEXIN 500 MG PO CAPS: 500.0000 mg | ORAL_CAPSULE | Freq: Two times a day (BID) | ORAL | 0 refills | Status: AC

## 2023-09-23 MED ORDER — HYDRALAZINE HCL 25 MG PO TABS: 25.0000 mg | ORAL_TABLET | Freq: Three times a day (TID) | ORAL | 0 refills | Status: DC

## 2023-09-23 NOTE — Care Management Important Message (Signed)
Important Message  Patient Details  Name: [name] [name] MRN: [id number] Date of Birth: 1941/06/15   Important Message Given:  Yes - Medicare IM     Dorena Bodo 09/23/2023, 2:02 PM

## 2023-09-23 NOTE — Care Management Important Message (Signed)
Important Message  Patient Details  Name: [name] [name] MRN: [id number] Date of Birth: 1941-04-20   Important Message Given:  Yes - Medicare IM     Dorena Bodo 09/23/2023, 3:02 PM

## 2023-09-23 NOTE — Discharge Instructions (Signed)
Advised to follow-up with urology as scheduled. Advised to follow-up with interventional radiology for biopsy on the parotid gland. Advised to take Keflex 500 mg twice daily for 7 days for UTI.

## 2023-09-23 NOTE — Plan of Care (Signed)

## 2023-09-23 NOTE — TOC Progression Note (Signed)
Transition of Care [facility]) - Progression Note    Patient Details  Name: [name] [name] MRN: [id number] Date of Birth: [birth year]  [facility] of [facility] CM/SW Contact  Huston Foley Jacklynn Ganong, RN Phone Number: 09/23/2023, 11:04 AM  Clinical Narrative:    Case Manager spoke with patient concerning recommendation for outpatient therapy. Patient is agreeable, referral has been sent to [facility]. Added to AVS.    Expected Discharge Plan: Home/Self Care    Expected Discharge Plan and Services         Expected Discharge Date: 09/23/23                                     Social Determinants of Health (SDOH) Interventions SDOH Screenings   Food Insecurity: No Food Insecurity (09/20/2023)  Housing: Low Risk  (09/20/2023)  Transportation Needs: No Transportation Needs (09/20/2023)  Utilities: Not At Risk (09/20/2023)  Alcohol Screen: Low Risk  (12/11/2022)  Depression (PHQ2-9): Low Risk  (12/11/2022)  Financial Resource Strain: Low Risk  (12/11/2022)  Physical Activity: Inactive (12/11/2022)  Social Connections: Socially Isolated (12/11/2022)  Stress: No Stress Concern Present (12/11/2022)  Tobacco Use: Medium Risk (09/19/2023)    Readmission Risk Interventions    09/02/2022    1:03 PM  Readmission Risk Prevention Plan  Post Dischage Appt Complete  Medication Screening Complete  Transportation Screening Complete

## 2023-09-23 NOTE — Progress Notes (Signed)
Mobility Specialist Progress Note:   09/23/23 1440  Mobility  Activity Ambulated with assistance in hallway  Level of Assistance Contact guard assist, steadying assist  Assistive Device  (HHA)  Distance Ambulated (ft) 300 ft  Activity Response Tolerated well  Mobility Referral Yes  $Mobility charge 1 Mobility  Mobility Specialist Start Time (ACUTE ONLY) 1400  Mobility Specialist Stop Time (ACUTE ONLY) 1415  Mobility Specialist Time Calculation (min) (ACUTE ONLY) 15 min   Pt received ambulating independently in room. Agreeable to mobility session. Tolerated well, audible SOB and coughing throughout session. Required CGA with gait belt and HHA for safety. Returned pt to room, all needs met, eager for d/c.   Feliciana Rossetti Mobility Specialist Please contact via SecureChat or  Rehab office at [phone]

## 2023-09-23 NOTE — Discharge Summary (Signed)
Physician Discharge Summary  [name] [name]:[id number] DOB: [birth year] DOA: 09/19/2023  PCP: Myrlene Broker, MD  Admit date: 09/19/2023  Discharge date: 09/23/2023  Admitted From: Home.  Disposition:  Home.  Recommendations for Outpatient Follow-up:  Follow up with PCP in 1-2 weeks. Please obtain BMP/CBC in one week. Advised to follow-up with Urology as scheduled. Advised to follow-up with interventional radiology for biopsy on the parotid gland. Advised to take Keflex 500 mg twice daily for 7 days for UTI.  Home Health: None Equipment/Devices:None  Discharge Condition: Stable CODE STATUS: Full code Diet recommendation: Heart Healthy   [facility]rief [facility] Course: This 82 y.o. female with medical history significant of hypertension, hyperlipidemia, asthma/COPD, H. pylori, GERD, bipolar 1 disorder, and anxiety who presents with complaints of left flank pain. Patient has intermittent confusion which was thought to be secondary to dementia.  She has followed up with psychiatry for psychosis and several changes has been made to her medications. Patient has followed up with primary care physician who has ordered CT chest abdomen pelvis which showed findings suggestive of ureteropelvic stricture. She was seen in the urgent care and found to have a UTI. She was sent in the ED for further evaluation.  Case was discussed with urology recommended outpatient follow-up.  Patient was admitted and started on antibiotics.  Patient has made significant improvement.  She was continued on ceftriaxone. Urine cultures were not sent.  Renal functions has significantly improved.  Blood pressure has improved , Medications were adjusted.  PT and OT recommended outpatient PT.  Patient was also found to have parotid gland mass,  IR recommended outpatient biopsy.  Patient feels better and wants to be discharged.  Patient is being discharged home on Keflex 500 twice daily for 7 days for UTI.  Discharge  Diagnoses:  Principal Problem:   Left flank pain Active Problems:   UTI (urinary tract infection)   AKI (acute kidney injury) (HCC)   Hypertensive urgency   Generalized weakness   Failure to thrive in adult   Anemia of chronic disease   Hypercalcemia   COPD with acute exacerbation (HCC)   Spinal stenosis   Bipolar disorder with psychotic features (HCC)   Mass of right parotid gland   GERD (gastroesophageal reflux disease)   Protein-calorie malnutrition, severe  Left flank pain, multifactorial: Likely secondary to UTI and UPJ stricture: Present on admission.  Patient presents with complaints of left flank pain over the last couple weeks.   Treated for urinary tract infection with Macrobid without improvement in symptoms.   Urinalysis + , CT A/P changes around UPJ suggestive of possible stricture.   Other cause for left flank pain could be lumbar spinal stenosis. Continue IV ceftriaxone.  Antibiotic changed to Keflex for 7 days. Urine cultures not sent Hydrocodone as needed for pain.   Acute kidney injury: She presented with serum creatinine 2.25, baseline creatinine 1.13. Patient reports poor p.o. intake which is likely a factor. Monitor intake and output Avoid nephrotoxic agents Renal functions improving.   Hypertensive urgency: > Improving BP on arrival 236/92.  Continue amlodipine Hydralazine IV as needed for elevated blood pressures. Added hydralazine 25 mg 3 times daily for better blood pressure control.   Generalized weakness: Failure to thrive: Patient reportedly had been weak and seemed to be off balance at home.   Denies any recent fall in the last month.   She reports 30 pounds weight loss over the last year with decreased p.o. intake TSH within normal limits  Continue Boost shakes inbetween meals PT and OT recommended outpatient PT   Hypercalcemia: Patient not on any calcium supplements.   Patient noted to have a right parotid gland 1.7 cm unclear if this  is correlated. Continue IV fluids Serum calcium normalized.   COPD exacerbation: Improved. Patient noted to have expiratory wheezes on physical exam.   Continue Dulera inhaler and Singulair Levalbuterol nebs as needed for shortness of breath/wheezing   Dextroconvex lumbar scoliosis : Spinal stenosis  MRI of the lumbar spine noted dextroconvex lumbar scoliosis . Severe neural foraminal stenosis at the left L1, left L3, and right L4 nerve levels. Patient had been given prednisone 60 mg p.o. to see if this helped as it was thought possibly related with patient's complaints of left-sided flank pain. Continue trial of prednisone Patient likely needs referral to neurosurgery in the outpatient setting.   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   IR consulted in regards to possible biopsy of mass.  IR recommended outpatient biopsy.   GERD: Continue Protonix  Discharge Instructions  Discharge Instructions     Call MD for:  difficulty breathing, headache or visual disturbances   Complete by: As directed    Call MD for:  persistant dizziness or light-headedness   Complete by: As directed    Call MD for:  persistant nausea and vomiting   Complete by: As directed    Diet - low sodium heart healthy   Complete by: As directed    Diet Carb Modified   Complete by: As directed    Discharge instructions   Complete by: As directed    Advised to follow-up with primary care physician in 1 week. Advised to follow-up with urology as scheduled. Advised to follow-up with interventional radiology for biopsy on the parotid gland. Advised to take Keflex 500 mg twice daily for 7 days for UTI.   Increase activity slowly   Complete by: As directed       Allergies as of  09/23/2023       Reactions   Influenza Vaccines Swelling   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   Latex Anaphylaxis, Swelling   Albuterol Anxiety   Switched to xopenex   Advair Diskus [fluticasone-salmeterol]    Tingling in mouth/ears ringing   Diclofenac Sodium    REACTION: Hives   Diclofenac Sodium Swelling   Doxycycline Nausea And Vomiting   Dulera [mometasone Furo-formoterol Fum]    HA   Penicillins Swelling   Tongue swelling   Sulfonamide Derivatives    Tongue swelling   Tiotropium Itching, Other (See Comments)   dysuria   Tiotropium Bromide Monohydrate    Tongue/mouth itching Dysuria         Medication List     STOP taking these medications    predniSONE 10 MG tablet Commonly known as: DELTASONE       TAKE these medications    amLODipine 10 MG tablet Commonly known as: NORVASC Take 1 tablet (10 mg total) by mouth daily.   cephALEXin 500 MG capsule Commonly known as: KEFLEX Take 1 capsule (500 mg total) by mouth 2 (two) times daily for 7 days.   divalproex 250 MG 24 hr tablet Commonly known as: Depakote ER Take 1 tablet (250 mg total) by mouth at bedtime.   EPINEPHrine 0.3 mg/0.3 mL Soaj  injection Commonly known as: EPI-PEN INJECT 0.3 MLS INTO THE MUSCLE ONCE FOR 1 DOSE.   hydrALAZINE 25 MG tablet Commonly known as: APRESOLINE Take 1 tablet (25 mg total) by mouth every 8 (eight) hours.   hydrOXYzine 10 MG tablet Commonly known as: ATARAX Take 1 tablet (10 mg total) by mouth 3 (three) times daily as needed.   mometasone-formoterol 200-5 MCG/ACT Aero Commonly known as: DULERA Inhale 2 puffs into the lungs 2 (two) times daily.   montelukast 10 MG tablet Commonly known as: SINGULAIR Take 1 tablet (10 mg total) by mouth at bedtime.   pantoprazole 40 MG tablet Commonly known as: PROTONIX TAKE 1 TABLET BY MOUTH EVERY DAY   PATADAY OP Place 1 drop into both eyes daily.   QUEtiapine 100 MG tablet Commonly known as: SEROquel Take 1  tablet (100 mg total) by mouth at bedtime.        Follow-up Information     [facility] Follow up.   Specialty: Rehabilitation Why: Someone from [facility] will contact you to arrange an appointment. Contact information: [address] [place] [address] [phone]        Myrlene Broker, MD Follow up in 1 week(s).   Specialty: Internal Medicine Contact information: [address] [place] [address] [phone]         [facility] Follow up in 1 week(s).   Contact information: [address] [place] [address] [place] [address] [phone]               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   Latex Anaphylaxis and Swelling   Albuterol Anxiety    Switched to xopenex   Advair Diskus [Fluticasone-Salmeterol]     Tingling in mouth/ears ringing   Diclofenac Sodium     REACTION: Hives   Diclofenac Sodium Swelling   Doxycycline Nausea And Vomiting   Dulera [Mometasone Furo-Formoterol Fum]     HA   Penicillins Swelling    Tongue swelling   Sulfonamide Derivatives     Tongue swelling    Tiotropium Itching and Other (See Comments)    dysuria   Tiotropium Bromide Monohydrate     Tongue/mouth itching Dysuria     Consultations: Urology   Procedures/Studies: US RENAL  Result Date: 09/22/2023 CLINICAL DATA:  Acute kidney injury EXAM: RENAL / URINARY TRACT ULTRASOUND COMPLETE COMPARISON:  CT abdomen and pelvis 09/19/2023 FINDINGS: Right Kidney: Renal measurements: 7.7 x 4.0 x 4.0 cm = volume: 60 mL. Echogenicity within normal limits. No mass or hydronephrosis visualized. Left Kidney: Renal measurements: 9.0 x 4.3 x 3.6 cm = volume: 72 mL. Echogenicity is mildly increased. No mass or hydronephrosis visualized. Bladder: Under distended and not well evaluated. Other: None. IMPRESSION: 1. No  hydronephrosis. 2. Mildly increased echogenicity of the left kidney may be due to medical renal disease. Electronically Signed   By: Darliss Cheney M.D.   On: 09/22/2023 18:21   MR LUMBAR SPINE WO CONTRAST  Result Date: 09/20/2023 CLINICAL DATA:  82 year old female with memory loss, headache, left flank, back pain. EXAM: MRI LUMBAR SPINE WITHOUT CONTRAST TECHNIQUE: Multiplanar, multisequence MR imaging of the lumbar spine was performed. No intravenous contrast was administered. COMPARISON:  CT Abdomen and Pelvis yesterday. FINDINGS: Segmentation:  Normal. Alignment: Lumbar lordosis superimposed on up to moderate dextroconvex lumbar scoliosis. Grade 1 spondylolisthesis of L5 on S1. Subtle retrolisthesis of L2 on L3.  Vertebrae: Background bone marrow signal is within normal limits. Preserved vertebral body height. Intact visible sacrum and SI joints. No marrow edema or evidence of acute osseous abnormality. Conus medullaris and cauda equina: Conus extends to the L1-L2 level. No lower spinal cord or conus signal abnormality. Paraspinal and other soft tissues: Stable from the CT Abdomen and Pelvis yesterday. Negative visualized posterior paraspinal soft tissues. Disc levels: Visible lower thoracic spine degeneration with no convincing lower thoracic spinal stenosis through T12-L1. L1-L2: Asymmetric disc bulging mostly affecting the neural foramina. Facet hypertrophy on the left is mild-to-moderate. No spinal stenosis. Moderate to severe left L1 foraminal stenosis. L2-L3: More circumferential disc bulge. Mild to moderate bilateral facet and ligament flavum hypertrophy. Borderline to mild spinal stenosis. Moderate bilateral L2 foraminal stenosis. L3-L4: Circumferential disc bulge. Broad-based posterior and foraminal involvement. Mild to moderate facet and ligament flavum hypertrophy. Mild spinal stenosis. Little lateral recess stenosis but moderate to severe left and moderate right L3 foraminal stenosis. L4-L5:  Asymmetric disc bulging and endplate spurring to the right. Mild to moderate facet and ligament flavum hypertrophy. No spinal stenosis. Mild right lateral recess stenosis (right L5 nerve level). Mild left L4 foraminal stenosis. Moderate to severe right L4 foraminal stenosis. L5-S1: Grade 1 anterolisthesis. Circumferential disc/pseudo disc. Moderate to severe facet hypertrophy greater on the right. No spinal or lateral recess stenosis. Mild left and moderate right L5 foraminal stenosis. IMPRESSION: 1. Dextroconvex lumbar scoliosis with grade 1 spondylolisthesis at L5-S1. No acute osseous abnormality. 2. 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 Associated up to Severe neural foraminal stenosis at the left L1, left L3, and right L4 nerve levels. Frequently moderate neural foraminal stenosis at the remaining levels. Electronically Signed   By: Odessa Fleming M.D.   On: 09/20/2023 05:42   MR BRAIN WO CONTRAST  Result Date: 09/20/2023 CLINICAL DATA:  82 year old female with memory loss, headache, left flank, back pain. EXAM: MRI HEAD WITHOUT CONTRAST TECHNIQUE: Multiplanar, multiecho pulse sequences of the brain and surrounding structures were obtained without intravenous contrast. COMPARISON:  Head CT 09/11/2023. FINDINGS: Brain: No restricted diffusion to suggest acute infarction. No midline shift, mass effect, evidence of mass lesion, ventriculomegaly, extra-axial collection or acute intracranial hemorrhage. Cervicomedullary junction and pituitary are within normal limits. Normal cerebral volume for age. Axial FLAIR series motion artifact. Mild for age scattered cerebral white matter T2 and FLAIR hyperintensity, similar heterogeneity in the bilateral deep gray nuclei. No cortical encephalomalacia identified. There is a chronic microhemorrhage in the left thalamus series 12, image 34. And there is a larger area of chronic hemorrhage in the posterior left corona radiata.  Subtle encephalomalacia there by CT (series 12, image 36 on SWI). No other chronic cerebral blood products. Brainstem and cerebellum appear negative. Vascular: Major intracranial vascular flow voids are preserved. Skull and upper cervical spine: Normal for age visible cervical spine. Visualized bone marrow signal is within normal limits. Sinuses/Orbits: Postoperative changes to both globes. Chronic paranasal sinus mucosal thickening, fluid levels. Other: Mastoids are clear. Grossly normal visible internal auditory structures. 17 mm oval T2 hypointense soft tissue nodule in the right parotid gland with mildly abnormal diffusion. IMPRESSION: 1. No acute intracranial abnormality. 2. 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 3. 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 Imaging appearance is indeterminate but benign histology is most  Common (Warthin's tumor, BMT). 4. Acute on chronic paranasal sinus inflammation. Electronically Signed   By: Odessa Fleming M.D.   On: 09/20/2023 05:37   CT ABDOMEN PELVIS WO CONTRAST  Result Date: 09/19/2023 CLINICAL DATA:  Left flank pain EXAM: CT ABDOMEN AND PELVIS WITHOUT CONTRAST TECHNIQUE: Multidetector CT imaging of the abdomen and pelvis was performed following the standard protocol without IV contrast. RADIATION DOSE REDUCTION: This exam was performed according to the departmental dose-optimization program which includes automated exposure control, adjustment of the mA and/or kV according to patient size and/or use of iterative reconstruction technique. COMPARISON:  CT abdomen and pelvis 09/11/2023 FINDINGS: Lower chest: No acute abnormality. Hepatobiliary: Unchanged hepatic cyst in the right hepatic lobe. Normal gallbladder. No biliary dilation. Pancreas: Unremarkable. Spleen: Unremarkable.  Adrenals/Urinary Tract: Stable adrenal glands. 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 No urinary calculi or hydronephrosis. Unremarkable bladder. Stomach/Bowel: Stomach is within normal limits. No bowel obstruction or bowel wall thickening. Colonic diverticulosis without diverticulitis. The appendix is not visualized.Stomach is within normal limits. Vascular/Lymphatic: No significant vascular findings are present. No enlarged abdominal or pelvic lymph nodes. Reproductive: Unremarkable. Other: No free intraperitoneal fluid or air. Musculoskeletal: No acute fracture. IMPRESSION: 1. No acute abnormality in the abdomen or pelvis. 2. 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 No urinary calculi or hydronephrosis. 3. Colonic diverticulosis without diverticulitis. Electronically Signed   By: Minerva Fester M.D.   On: 09/19/2023 22:30   CT HEAD WO CONTRAST ( )  Result Date: 09/17/2023 CLINICAL DATA:  Provided history: Memory loss. Headache, increasing frequency or severity. EXAM: CT HEAD WITHOUT CONTRAST TECHNIQUE: Contiguous axial images were obtained from the base of the skull through the vertex without intravenous contrast. RADIATION DOSE REDUCTION: This exam was performed according to the departmental dose-optimization program which includes automated exposure control, adjustment of the mA and/or kV according to patient size and/or use of iterative reconstruction technique. COMPARISON:  None. FINDINGS: Mildly motion degraded exam. Brain: Mild generalized cerebral volume loss. Small chronic lacunar infarct within the left lentiform nucleus. There is no acute intracranial hemorrhage. No demarcated cortical infarct. No extra-axial fluid collection. No evidence of an intracranial mass. No midline shift. Vascular: No hyperdense vessel.  Atherosclerotic calcifications.  Skull: No calvarial fracture or aggressive osseous lesion. Sinuses/Orbits: No mass or acute finding within the imaged orbits. Severe right maxillary sinusitis at the imaged levels. Small fluid level, and background moderate/severe mucosal thickening, within the left maxillary sinus at the imaged levels. Mild mucosal thickening within the bilateral frontal sinuses. IMPRESSION: 1. Mildly motion degraded exam. 2.  No evidence of an acute intracranial abnormality. 3. 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 4. Mild generalized cerebral volume loss. 5. Paranasal sinus disease at the imaged levels, as described. Electronically Signed   By: Jackey Loge D.O.   On: 09/17/2023 12:50   CT ABDOMEN PELVIS WO CONTRAST  Result Date: 09/17/2023 CLINICAL DATA:  Diffuse interstitial lung disease, history of COPD, severe left lower quadrant abdominal pain, unintentional weight loss EXAM: CT CHEST, ABDOMEN AND PELVIS WITHOUT CONTRAST TECHNIQUE: Multidetector CT imaging of the chest, abdomen and pelvis was performed following the standard protocol without IV contrast. RADIATION DOSE REDUCTION: This exam was performed according to the departmental dose-optimization program which includes automated exposure control, adjustment of the mA and/or kV according to patient size and/or use of iterative reconstruction technique. COMPARISON:  CT chest, 04/26/2020 FINDINGS: CT CHEST FINDINGS Cardiovascular: Aortic atherosclerosis. Normal heart size. Three-vessel coronary artery calcifications. No pericardial effusion. Mediastinum/Nodes: No enlarged mediastinal, hilar, or axillary lymph nodes. Thyroid gland, trachea, and esophagus demonstrate no significant findings. Lungs/Pleura: Pulmonary hyperinflation. Diffuse bilateral bronchial wall thickening. Unchanged, benign 0.5 cm nodule of the anterior right upper lobe, for which no further follow-up or characterization is required (series 5, image 26). Multiple additional tiny  nodules in left lung, likewise benign, requiring no further follow-up or characterization. No pleural effusion or pneumothorax. Musculoskeletal: No chest wall abnormality. No acute osseous findings. CT ABDOMEN PELVIS FINDINGS Hepatobiliary: No solid liver abnormality is seen. Simple, benign cyst of the posterior right lobe of the liver, for which no further follow-up or characterization is required. No gallstones, gallbladder wall thickening, or biliary dilatation. Pancreas: Unremarkable. No pancreatic ductal dilatation or surrounding inflammatory changes. Spleen: Normal in size without significant abnormality. Adrenals/Urinary Tract: Adrenal glands are unremarkable. Markedly enlarged left renal pelvis, with abrupt caliber change of the ureteropelvic junction (series 3, image 60, series 4, image 65). No urinary tract calculi or other obstruction identified. Right kidney is normal. No obvious solid lesion or hydronephrosis. Bladder is unremarkable. Stomach/Bowel: Stomach is within normal limits. Status post appendectomy. No evidence of bowel wall thickening, distention, or inflammatory changes. Sigmoid diverticulosis. Vascular/Lymphatic: Aortic atherosclerosis. No enlarged abdominal or pelvic lymph nodes. Reproductive: No mass or other abnormality. Other: No abdominal wall hernia or abnormality. No ascites. Musculoskeletal: No acute osseous findings. IMPRESSION: 1. Markedly enlarged left renal pelvis, with abrupt caliber change of the ureteropelvic junction. No urinary tract calculi or other obstruction identified. Findings suggest chronic ureteropelvic junction stricture. This could be assessed for functional significance by nuclear scintigraphic renogram if desired. 2. Pulmonary hyperinflation and diffuse bilateral bronchial wall thickening, consistent with history of COPD. 3. Sigmoid diverticulosis without evidence of acute diverticulitis. 4. Coronary artery disease. Aortic Atherosclerosis (ICD10-I70.0).  Electronically Signed   By: Jearld Lesch M.D.   On: 09/17/2023 12:45   CT Chest Wo Contrast  Result Date: 09/17/2023 CLINICAL DATA:  Diffuse interstitial lung disease, history of COPD, severe left lower quadrant abdominal pain, unintentional weight loss EXAM: CT CHEST, ABDOMEN AND PELVIS WITHOUT CONTRAST TECHNIQUE: Multidetector CT imaging of the chest, abdomen and pelvis was performed following the standard protocol without IV contrast. RADIATION DOSE REDUCTION: This exam was performed according to the departmental dose-optimization program which includes automated exposure control, adjustment of the mA and/or kV according to patient size and/or use of iterative reconstruction technique. COMPARISON:  CT chest, 04/26/2020 FINDINGS: CT CHEST FINDINGS Cardiovascular: Aortic atherosclerosis. Normal heart size. Three-vessel coronary artery calcifications. No pericardial effusion. Mediastinum/Nodes: No enlarged mediastinal, hilar, or axillary lymph nodes. Thyroid gland, trachea, and esophagus demonstrate no significant findings. Lungs/Pleura: Pulmonary hyperinflation. Diffuse bilateral bronchial wall thickening. Unchanged, benign 0.5 cm nodule of the anterior right upper lobe, for which no further follow-up or characterization is required (series 5, image 26). Multiple additional tiny nodules in left lung, likewise benign, requiring no further follow-up or characterization. No pleural effusion or pneumothorax. Musculoskeletal: No chest wall abnormality. No acute osseous findings. CT ABDOMEN PELVIS FINDINGS Hepatobiliary: No solid liver abnormality is seen. Simple, benign cyst of the posterior right lobe of the liver, for which no further follow-up or characterization is required. No gallstones, gallbladder wall thickening, or biliary dilatation. Pancreas: Unremarkable. No pancreatic ductal dilatation or surrounding inflammatory changes. Spleen: Normal in size without significant abnormality. Adrenals/Urinary Tract:  Adrenal glands are unremarkable. Markedly enlarged left renal pelvis,  with abrupt caliber change of the ureteropelvic junction (series 3, image 60, series 4, image 65). No urinary tract calculi or other obstruction identified. Right kidney is normal. No obvious solid lesion or hydronephrosis. Bladder is unremarkable. Stomach/Bowel: Stomach is within normal limits. Status post appendectomy. No evidence of bowel wall thickening, distention, or inflammatory changes. Sigmoid diverticulosis. Vascular/Lymphatic: Aortic atherosclerosis. No enlarged abdominal or pelvic lymph nodes. Reproductive: No mass or other abnormality. Other: No abdominal wall hernia or abnormality. No ascites. Musculoskeletal: No acute osseous findings. IMPRESSION: 1. Markedly enlarged left renal pelvis, with abrupt caliber change of the ureteropelvic junction. No urinary tract calculi or other obstruction identified. Findings suggest chronic ureteropelvic junction stricture. This could be assessed for functional significance by nuclear scintigraphic renogram if desired. 2. Pulmonary hyperinflation and diffuse bilateral bronchial wall thickening, consistent with history of COPD. 3. Sigmoid diverticulosis without evidence of acute diverticulitis. 4. Coronary artery disease. Aortic Atherosclerosis (ICD10-I70.0). Electronically Signed   By: Jearld Lesch M.D.   On: 09/17/2023 12:45     Subjective: Patient was seen and examined at bedside.  Overnight events noted.   Patient reports doing much better,  pain has improved.  She wants to be discharged home.  Discharge Exam: Vitals:   09/23/23 0745 09/23/23 0918  BP: 132/63   Pulse: 66   Resp: 16   Temp: 98.6 F (37 C)   SpO2: 99% 98%   Vitals:   09/22/23 2123 09/23/23 0407 09/23/23 0745 09/23/23 0918  BP:  (!) 158/75 132/63   Pulse:  75 66   Resp:   16   Temp:  98.4 F (36.9 C) 98.6 F (37 C)   TempSrc:  Oral Oral   SpO2: 99% 100% 99% 98%  Weight:      Height:        General:  Pt is alert, awake, not in acute distress Cardiovascular: RRR, S1/S2 +, no rubs, no gallops Respiratory: CTA bilaterally, no wheezing, no rhonchi Abdominal: Soft, NT, ND, bowel sounds + Extremities: no edema, no cyanosis    The results of significant diagnostics from this hospitalization (including imaging, microbiology, ancillary and laboratory) are listed below for reference.     Microbiology: No results found for this or any previous visit (from the past 240 hour(s)).   Labs: BNP (last 3 results) No results for input(s): "BNP" in the last 8760 hours. Basic Metabolic Panel: Recent Labs  Lab 09/17/23 0940 09/19/23 1901 09/21/23 0516 09/23/23 0915  NA 136 137 140 141  K 4.2 4.4 4.6 4.2  CL 105 108 113* 112*  CO2 24 20* 24 22  GLUCOSE 109* 106* 94 86  BUN 38* 47* 40* 39*  CREATININE 2.18* 2.25* 2.35* 2.25*  CALCIUM 11.2* 10.9* 10.0 10.1  PHOS 3.3  --   --   --    Liver Function Tests: Recent Labs  Lab 09/17/23 0940 09/19/23 1901  AST  --  19  ALT  --  10  ALKPHOS  --  60  BILITOT  --  0.4  PROT  --  7.6  ALBUMIN 3.9 3.6   Recent Labs  Lab 09/19/23 1901  LIPASE 45   No results for input(s): "AMMONIA" in the last 168 hours. CBC: Recent Labs  Lab 09/19/23 1901 09/21/23 0516 09/23/23 0915  WBC 8.2 7.5 8.0  NEUTROABS 3.8  --   --   HGB 10.4* 9.2* 9.1*  HCT 35.0* 29.9* 29.4*  MCV 91.9 92.3 91.0  PLT 252 211 223   Cardiac Enzymes:  No results for input(s): "CKTOTAL", "CKMB", "CKMBINDEX", "TROPONINI" in the last 168 hours. BNP: Invalid input(s): "POCBNP" CBG: No results for input(s): "GLUCAP" in the last 168 hours. D-Dimer No results for input(s): "DDIMER" in the last 72 hours. Hgb A1c No results for input(s): "HGBA1C" in the last 72 hours. Lipid Profile No results for input(s): "CHOL", "HDL", "LDLCALC", "TRIG", "CHOLHDL", "LDLDIRECT" in the last 72 hours. Thyroid function studies No results for input(s): "TSH", "T4TOTAL", "T3FREE", "THYROIDAB" in  the last 72 hours.  Invalid input(s): "FREET3" Anemia work up No results for input(s): "VITAMINB12", "FOLATE", "FERRITIN", "TIBC", "IRON", "RETICCTPCT" in the last 72 hours. Urinalysis    Component Value Date/Time   COLORURINE YELLOW 08/30/2022 0048   APPEARANCEUR HAZY (A) 08/30/2022 0048   LABSPEC 1.016 08/30/2022 0048   PHURINE 5.0 08/30/2022 0048   GLUCOSEU NEGATIVE 08/30/2022 0048   GLUCOSEU NEGATIVE 09/15/2013 1412   HGBUR MODERATE (A) 08/30/2022 0048   BILIRUBINUR negative 09/19/2023 1205   KETONESUR trace (5) (A) 09/19/2023 1205   KETONESUR 20 (A) 08/30/2022 0048   PROTEINUR =100 (A) 09/19/2023 1205   PROTEINUR 100 (A) 08/30/2022 0048   UROBILINOGEN 0.2 09/19/2023 1205   UROBILINOGEN 0.2 09/15/2013 1412   NITRITE Negative 09/19/2023 1205   NITRITE NEGATIVE 08/30/2022 0048   LEUKOCYTESUR Large (3+) (A) 09/19/2023 1205   LEUKOCYTESUR SMALL (A) 08/30/2022 0048   Sepsis Labs Recent Labs  Lab 09/19/23 1901 09/21/23 0516 09/23/23 0915  WBC 8.2 7.5 8.0   Microbiology No results found for this or any previous visit (from the past 240 hour(s)).   Time coordinating discharge: Over 30 minutes  SIGNED:   Willeen Niece, MD  [facility] 09/23/2023, 2:18 PM Pager   If 7PM-7AM, please contact night-coverage

## 2023-09-24 ENCOUNTER — Telehealth: Payer: Self-pay | Admitting: *Deleted

## 2023-09-24 ENCOUNTER — Encounter: Payer: Self-pay | Admitting: Internal Medicine

## 2023-09-24 ENCOUNTER — Encounter: Payer: Self-pay | Admitting: Physician Assistant

## 2023-09-24 NOTE — Transitions of Care (Post Inpatient/ED Visit) (Signed)
09/24/2023  Name: [name] [name] MRN: [id number] DOB: 1941/09/26  Today's TOC FU Call Status: Today's TOC FU Call Status:: Successful TOC FU Call Completed Patient's Name and Date of Birth confirmed.  Transition Care Management Follow-up Telephone Call Date of Discharge: 09/24/23 Discharge Facility: [facility]) Type of Discharge: Inpatient Admission Primary Inpatient Discharge Diagnosis:: Left flank pain How have you been since you were released from the hospital?: Better Any questions or concerns?: No  Items Reviewed: Did you receive and understand the discharge instructions provided?: Yes Medications obtained,verified, and reconciled?: Yes (Medications Reviewed) Any new allergies since your discharge?: No Dietary orders reviewed?: No Do you have support at home?: Yes People in Home: child(ren), adult Name of Support/Comfort Primary Source: [name]  Medications Reviewed Today: Medications Reviewed Today     Reviewed by Luella Cook, RN (Case Manager) on 09/24/23 at 1149  Med List Status: <None>   Medication Order Taking? Sig Documenting Provider Last Dose Status Informant  amLODipine (NORVASC) 10 MG tablet [id number] Yes Take 1 tablet (10 mg total) by mouth daily. Myrlene Broker, MD Taking Active Child  cephALEXin [facility]) 500 MG capsule [id number] Yes Take 1 capsule (500 mg total) by mouth 2 (two) times daily for 7 days. Willeen Niece, MD Taking Active   divalproex (DEPAKOTE ER) 250 MG 24 hr tablet [id number] Yes Take 1 tablet (250 mg total) by mouth at bedtime. Bobbye Morton, MD Taking Active Child  EPINEPHrine 0.3 mg/0.3 mL IJ SOAJ injection [id number] Yes INJECT 0.3 MLS INTO THE MUSCLE ONCE FOR 1 DOSE. Myrlene Broker, MD Taking Active Child  hydrALAZINE (APRESOLINE) 25 MG tablet [id number] Yes Take 1 tablet (25 mg total) by mouth every 8 (eight) hours. Willeen Niece, MD Taking Active   hydrOXYzine (ATARAX) 10 MG tablet [id number] Yes Take 1 tablet (10 mg  total) by mouth 3 (three) times daily as needed. Bobbye Morton, MD Taking Active Child  mometasone-formoterol [facility]) 200-5 MCG/ACT Sandrea Matte [id number] Yes Inhale 2 puffs into the lungs 2 (two) times daily. Kalman Shan, MD Taking Active Child  montelukast (SINGULAIR) 10 MG tablet [id number] Yes Take 1 tablet (10 mg total) by mouth at bedtime. Kalman Shan, MD Taking Active Child  Olopatadine HCl (PATADAY OP) [id number] Yes Place 1 drop into both eyes daily. [provider] Taking Active Child  pantoprazole (PROTONIX) 40 MG tablet [id number] Yes TAKE 1 TABLET BY MOUTH EVERY DAY Myrlene Broker, MD Taking Active Child  QUEtiapine (SEROQUEL) 100 MG tablet [id number] Yes Take 1 tablet (100 mg total) by mouth at bedtime. Bobbye Morton, MD Taking Active Child            Home Care and Equipment/Supplies: Were Home Health Services Ordered?: NA Any new equipment or medical supplies ordered?: NA  Functional Questionnaire: Do you need assistance with bathing/showering or dressing?: No Do you need assistance with meal preparation?: No Do you need assistance with eating?: No Do you have difficulty maintaining continence: No Do you need assistance with getting out of bed/getting out of a chair/moving?: No Do you have difficulty managing or taking your medications?: No  Follow up appointments reviewed: PCP Follow-up appointment confirmed?: No MD Provider Line Number:[phone] Given: Yes (Patient has called and is waiting on the PCP to call her back) Specialist Hospital Follow-up appointment confirmed?: Yes Date of Specialist follow-up appointment?:  (Date set/) Follow-Up Specialty Provider:: urologist Do you need transportation to your follow-up appointment?: No Do you understand care options if your condition(s) worsen?: Yes-patient  verbalized understanding  SDOH Interventions Today    Flowsheet Row Most Recent Value  SDOH Interventions   Food Insecurity Interventions  Intervention Not Indicated  Housing Interventions Intervention Not Indicated  Transportation Interventions Intervention Not Indicated, Patient Resources (Friends/Family)  [Daughter]  Utilities Interventions Intervention Not Indicated      No further follow up needed Gean Maidens BSN RN Population Health- Transition of Care Team.  [facility] [phone]

## 2023-09-26 ENCOUNTER — Other Ambulatory Visit: Payer: Self-pay | Admitting: Internal Medicine

## 2023-10-03 ENCOUNTER — Encounter: Payer: Self-pay | Admitting: Internal Medicine

## 2023-10-03 ENCOUNTER — Other Ambulatory Visit: Payer: Self-pay | Admitting: Internal Medicine

## 2023-10-03 ENCOUNTER — Telehealth: Payer: Self-pay

## 2023-10-03 ENCOUNTER — Ambulatory Visit: Payer: Medicare Other | Admitting: Internal Medicine

## 2023-10-03 VITALS — BP 162/86 | HR 74 | Temp 98.3°F | Ht 64.5 in | Wt 122.0 lb

## 2023-10-03 DIAGNOSIS — N179 Acute kidney failure, unspecified: Secondary | ICD-10-CM

## 2023-10-03 DIAGNOSIS — J4550 Severe persistent asthma, uncomplicated: Secondary | ICD-10-CM

## 2023-10-03 DIAGNOSIS — D721 Eosinophilia, unspecified: Secondary | ICD-10-CM

## 2023-10-03 DIAGNOSIS — Z862 Personal history of diseases of the blood and blood-forming organs and certain disorders involving the immune mechanism: Secondary | ICD-10-CM

## 2023-10-03 LAB — BASIC METABOLIC PANEL
BUN: 35 mg/dL — ABNORMAL HIGH (ref 6–23)
CO2: 26 meq/L (ref 19–32)
Calcium: 10.6 mg/dL — ABNORMAL HIGH (ref 8.4–10.5)
Chloride: 107 meq/L (ref 96–112)
Creatinine, Ser: 1.84 mg/dL — ABNORMAL HIGH (ref 0.40–1.20)
GFR: 25.58 mL/min — ABNORMAL LOW (ref >60.00)
Glucose, Bld: 95 mg/dL (ref 70–99)
Potassium: 4.6 meq/L (ref 3.5–5.1)
Sodium: 140 meq/L (ref 135–145)

## 2023-10-03 LAB — CBC WITH DIFFERENTIAL/PLATELET
Basophils Absolute: 0.1 10*3/uL (ref 0.0–0.1)
Basophils Relative: 1.2 % (ref 0.0–3.0)
Eosinophils Absolute: 1.1 10*3/uL — ABNORMAL HIGH (ref 0.0–0.7)
Eosinophils Relative: 14.2 % — ABNORMAL HIGH (ref 0.0–5.0)
HCT: 34.1 % — ABNORMAL LOW (ref 36.0–46.0)
Hemoglobin: 10.7 g/dL — ABNORMAL LOW (ref 12.0–15.0)
Lymphocytes Relative: 17.6 % (ref 12.0–46.0)
Lymphs Abs: 1.3 10*3/uL (ref 0.7–4.0)
MCHC: 31.5 g/dL (ref 30.0–36.0)
MCV: 89.6 fL (ref 78.0–100.0)
Monocytes Absolute: 0.6 10*3/uL (ref 0.1–1.0)
Monocytes Relative: 8 % (ref 3.0–12.0)
Neutro Abs: 4.4 10*3/uL (ref 1.4–7.7)
Neutrophils Relative %: 59 % (ref 43.0–77.0)
Platelets: 249 10*3/uL (ref 150.0–400.0)
RBC: 3.8 Mil/uL — ABNORMAL LOW (ref 3.87–5.11)
RDW: 14.9 % (ref 11.5–15.5)
WBC: 7.4 10*3/uL (ref 4.0–10.5)

## 2023-10-03 MED ORDER — IPRATROPIUM-ALBUTEROL 0.5-2.5 (3) MG/3ML IN SOLN: RESPIRATORY_TRACT | 5 refills | Status: DC

## 2023-10-03 MED ORDER — BUDESONIDE 0.25 MG/2ML IN SUSP: RESPIRATORY_TRACT | 5 refills | Status: DC

## 2023-10-03 NOTE — Progress Notes (Signed)
OV 12/13/2016  Chief Complaint  Patient presents with   Follow-up    3 mo f/u. Pt had a URI back in Dec but feels better.     82 year old female with moderate to severe persistent asthma on triple inhaler therapy along with anti-IgE therapy. She has elevated eosinophils 700 cells per cubic millimeter October 2017.  At last visit due to repeated asthma exacerbations although with suspected noncompliance winded a CT scan of the chest.  Did not s she did have coronary artery calcification and according to her history she went and saw a cardiologist but is declined cardiac stress test because of confusion.e ABPA.  at last visit we discussed interleukin-5 receptor antibody treatment . Initial recommendation for [employer] branded therapy  Nucala but in the interim I was advised by the staff that IV infusion therapy by  Particia Jasper might be better for her. However patient tells me that she does not want IV infusion therapy and it is very inconvenient. She prefers subcutaneous injection therapy. Nucala. In addition at last visit did perform the CT sinus showed and opacification. I referred her to ENT but she says that she did not get an appointment. Last visit also started Spiriva which she says works really well for her. Nevertheless she thinks in the last day or so she's been getting a sinus exacerbation of acute sinusitis and wants antibiotic and prednisone same. She is frustrated by her quality of life. In addition she's not sure whether she wants to take interleukin-5 receptor antibody as a replacement for anti-IgE therapy or in addition to anti-IgE     OV 03/12/2017  Chief Complaint  Patient presents with   Follow-up    Pt states her breathing has been worse today. Pt c/o prod cough with yellow mucus x 2 days. Pt denies f/c/s.    82 year old female with elevated IgE and eosinophilia with moderate persistent asthma with recurrent exacerbations  She presents for routine follow-up and  says that since today she's had more shortness of breath, chest tightness and wheezing with yellow sputum and wants an antibiotic and prednisone. In fact she tells me that she wants to stay on chronic daily low dose prednisone. At this is at least through the summer. She still awaiting approval for interleukin-5 receptor antibody. Meanwhile she continues on anti-IgE Xolair.   OV 06/17/2017  Chief Complaint  Patient presents with   Follow-up    Pt states her SOB is at baseline. Pt c/o prod cough with white mucus - worse at night. Pt denies CP/tightness and f/c/s.    Follow-up moderate persistent asthma associated with elevated IgE and eosinophilia with recurrent exacerbations. On biologicals Xolair therapy  This is a routine follow-up. Overall she's stable. This no exacerbation currently. Overall she feels that she is more fatigued then she's been in the last few years. She is trying to change homes because the current home she lives and was walking up and down stairs. We discussed interleukin-5 receptor antibody therapy at last visit due to recurrent exacerbations despite Xolair. After some investigation into it she tells me that her insurance will not approve this. She's also personal preference that she hold off on that but just continue with the status quo. There no interim new problems.   OV 09/22/2017  Chief Complaint  Patient presents with   Follow-up    Pt states that she has been doing good. Is a little SOB today with occ. cough. States that she has not  had any flare-ups with her asthma.   Follow-up moderate persistent asthma associated with elevated IgE and eosinophilia with recurrent exacerbations. On biologicals Xolair therapy   [name] presents for follow-up.  She thinks her asthma may be slightly active today.  In fact she has the highest nitric oxide she has ever registered at 55 ppb.  In terms of her asthma control questionnaire she is not waking up in the middle of the night  because of asthma but when she wakes up she has moderate symptoms.  She is moderately limited in her activities because of asthma.  She is experiencing a little shortness of breath because of asthma and is wheezing a moderate amount of the time and is using albuterol for rescue between 5 and 8 puffs on most days.  This brings to six-point score five-point score to 2.2 showing active asthma  She tells me that because she has been weaned off prednisone that she is flaring up more easily.  Currently increased cough, sputum volume with thickness and sputum but without change in color and shortness of breath and wheezing for the last 1 week.  She feels she needs antibiotics and prednisone.  She is still reluctant to go back on chronic prednisone.  We discussed other biological therapy but she is frustrated because of insurance plan issues.  She is considering switching to the [facility] Medicare plan and she is asking for details.  Also because she has allergy to flu shot she is asking for a Tamiflu prescription to keep in her hand and advance in case she needs it in the future   OV 01/16/2018  Chief Complaint  Patient presents with   Follow-up    asthma - 2 flare  ups since last visit, occ wheezing, no chest tightness no cough    OV 01/16/2018  Chief Complaint  Patient presents with   Follow-up    asthma - 2 flare  ups since last visit, occ wheezing, no chest tightness no cough    82 year old female with moderate to severe asthma with eosinophilia and high IgE recurrent exacerbations.  Finally weaned off prednisone but she still has recurrent exacerbations.  She had an exacerbation recently took prednisone.  Currently she was feeling fine but when she came into our office she was exposed to a small service dog and she feels she is in an exacerbation.  Once antibiotics and prednisone.  She is compliant with all her inhaler and Xolair.  We discussed new interleukin-13 biologic dupixent.  She is  somewhat lukewarm to the idea.  She says she will read the product literature.    OV 06/01/2018  Chief Complaint  Patient presents with   Follow-up    Pt states she has had both good and bad days since last visit. Pt has c/o cough and SOB. Denies any CP.   Follow-up severe persistent asthma with high eosinophils and elevated IgE   She continues to remain off prednisone. She is on inhaler therapy and Xolair therapy. She continues to have significant symptoms. Her asthma control question is significantly elevated at over 2. She waking up a few times at night and when she wakes up she has very mild symptoms she is moderately limited in her activities because of asthma and she's short of breath a moderate amount of time and is wheezing a moderate amount of the time and is using albuterol for rescue at least one to 2 puffs daily. She feels this is because she missed  the most recent Xolair dose and because of the heat but when reviewing her pattern this is her baseline. Her nitric oxide is elevated at 63 ppb. Therefore she has continued to remain significantly symptomatic. She has been giving some thought to the ILD13 Mab dupulimab after some discussion she agreed to taking it  OV 09/09/2018  Subjective:  Patient ID: [name] [name], female , DOB: [birth year] , age 63 y.o. , MRN: [id number] , ADDRESS: [address] [place] [address]   09/09/2018 -   Chief Complaint  Patient presents with   Follow-up    asthma      HPI [name] [name] 82 y.o. -presents for follow-up.  In the interim her asthma is stable.  Asthma control questionnaire is 1.4 which is better than previous visits.  When she wakes up at night she wakes up a few times because of asthma symptoms.  After waking up she has mild symptoms.  She is very limited in activities because of asthma and a little short of breath because of asthma and wheezing at the time and using albuterol for rescue 1 or 2 times daily.  She could not complete the  exam nitric oxide test.  At this time she is continuing Xolair but she is looking at switching to dupilumab.  This is been approved and she plans to start in December 2019 when her new insurance kicks in.  She is also on Spiriva and because of her asthma.  Recently she had to try Incruse.  However she does not want to start it because of insurance reasons unless she tries a sample.  At this point in time today we do not have a sample of Incruse.  She has been doing maintenance rehabilitation for 10 years.  She is wondering about taking a break but is worried that she might lose her spot. Also wants me t to give refills on pred bursts - and add doxy to allergy list   She has CKD and is aksing me to check her blood work        OV 12/11/2018  Subjective:  Patient ID: [name] [name], female , DOB: 1941-04-27 , age 22 y.o. , MRN: [id number] , ADDRESS: [address] [place] [address]   12/11/2018 -   Chief Complaint  Patient presents with   Follow-up    Pt states she has been doing okay since last visit and denies any complaints.     HPI [name] [name] 82 y.o. -presents for follow-up of her severe persistent asthma.  She tells me she is stable but when she feels the asthma control questionnaire symptoms were much higher than baseline.  Then she admitted that she was having more wheezing and more symptomatic in the last few days.  At night she is waking up several times.  When she wakes up in the morning she has mild symptoms she is moderately limited in activities because of asthma.  Moderate amount of shortness of breath.  Moderate amount of wheezing and using albuterol for rescue at least 2 times daily.  She continues to be on Xolair.  She takes Spiriva and Symbicort although compliance is not known.  We have been working for him many months about switching her to dupilumab.  Now she tells me there is still some delays with insurance paperwork.  I will ask her to meet the Biologics coordinator for  this.  She has a new issue of wanting me to fill out  transport disability form.  And I did this.   ROS - per HPI     OV 04/06/2020  Subjective:  Patient ID: [name] [name], female , DOB: 01-Feb-1941 , age 59 y.o. , MRN: [id number] , ADDRESS: [address] [place] [address]   04/06/2020 -   Chief Complaint  Patient presents with   Follow-up    asthma   Follow-up complicated asthma  HPI [name] [name] 82 y.o. -last seen in January 2020.  At that time ordered diplomat.  According to the medical assistant because of insurance reasons she did not qualify for this patient.  According to the patient because of the global pandemic and social distancing she did not get to taking this.  Nevertheless the pandemic and social distancing has helped her asthma.  She is compliant with her Symbicort and Spiriva.  The pandemic forced her into masking and social distancing and isolation that her asthma is under good control.  She says now coming to the office she had some wheezing so she want some prednisone and antibiotic for future use.  She does not want to do the Covid short of flu shot because of previous allergies.  Also she is asking me to check all her general lab work including CBC chemistry liver function test vitamin D and TSH and hemoglobin A1c as part of a general health maintenance.       OV 10/02/2020  Subjective:  Patient ID: [name] [name], female , DOB: 09/22/1941 , age 32 y.o. , MRN: [id number] , ADDRESS: [address] [place] [address] PCP Myrlene Broker, MD Patient Care Team: Myrlene Broker, MD as PCP - General (Internal Medicine) Tonny Bollman, MD as PCP - Cardiology (Cardiology) Ernesto Rutherford, MD as Consulting Physician (Ophthalmology) Coralyn Helling, MD (Pulmonary Disease) Kalman Shan, MD (Pulmonary Disease) Meryl Dare, MD (Gastroenterology) Serena Colonel, MD (Otolaryngology)  This Provider for this visit: Treatment Team:  Attending Provider:  Kalman Shan, MD    10/02/2020 -   Chief Complaint  Patient presents with   Follow-up    Pt states she has been okay since last visit. States she has had some flare ups with her asthma. Pt does have some current complaints of increased SOB, occ cough, and occ wheezing.   Follow-up complicated asthma  HPI [name] [name] 82 y.o. -returns for follow-up.  Since her last visit in May 2021 overall she is doing well.  She not taking Spiriva anymore.  She is only on Symbicort.  We checked her blood eosinophils is very high.  700 cells per cubic millimeter.  Her blood IgE is greater than 1000.  I discussed again the role of biologic therapy but she is not interested.  She says overall she is doing well and she feels masking and social distancing will be enough.  She says since her last visit she has lost 2 of her younger sisters.  She is sad because of this.  She says she is compliant with her Symbicort.  She is also on not interested in getting Covid vaccine or flu shot because of allergies.      IMPRESSION: 1. No evidence of interstitial lung disease. 2. 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 3. Bilateral thyroid nodules. Recommend thyroid US (ref: J Am Coll Radiol. 2015 Feb;12(2): 143-50). 4. Aortic atherosclerosis (ICD10-I70.0). Coronary artery calcification. 5.  Emphysema (ICD10-J43.9).      OV 04/05/2021  Subjective:  Patient ID: [name] [name], female ,  DOB: 04-Nov-1941 , age 44 y.o. , MRN: [id number] , ADDRESS: [address] [place] [address] PCP Myrlene Broker, MD Patient Care Team: Myrlene Broker, MD as PCP - General (Internal Medicine) Tonny Bollman, MD as PCP - Cardiology (Cardiology) Ernesto Rutherford, MD as Consulting Physician (Ophthalmology) Coralyn Helling, MD (Pulmonary Disease) Kalman Shan, MD (Pulmonary Disease) Meryl Dare, MD (Gastroenterology) Serena Colonel, MD (Otolaryngology)  This Provider for this visit: Treatment Team:   Attending Provider: Kalman Shan, MD  Type of visit: Telephone/Video Circumstance: COVID-19 national emergency Identification of patient [name] [name] with 1940-11-13 and MRN [id number] - 2 person identifier Risks: Risks, benefits, limitations of telephone visit explained. Patient understood and verbalized agreement to proceed Anyone else on call: onlt patient Patient location: [phone] mobile -> then tried [phone] This provider location: [address], [place], [place], [address]    Followup complicated asthma  04/05/2021 -   Doing well.  No nocturnal awakenings no chest pain.  Uses Symbicort and Singulair.  Is on Xopenex for rescue.  Hardly any use.  No emergency room visits no urgent care visits.  No prednisone use for several months.  Allergy season bothering her but she is controlling that with Vicks VapoRub.  She does not want to do Biologics she does not want to do vaccine.     05/23/2022 Follow up: Asthma  Patient presents for a 1 year follow-up visit.  Patient says overall asthma is doing okay without flare of cough or wheezing.  She is maintained on Symbicort twice daily.  She takes Singulair daily.  Says she has had no increased Xopenex use.  She has completed pulmonary rehab.  Feels that this really helped her.  She misses going to this.  Patient says she tries to stay active does some light activities and exercises at home. Says she has been under some stress with family.  Her son recently had a stroke.  She has had some friends that are passed away and is somewhat sad about this.  Support was provided.     OV 07/30/2022  Subjective:  Patient ID: [name] [name], female , DOB: 1941/03/27 , age 23 y.o. , MRN: [id number] , ADDRESS: [address] [place] [address] PCP Myrlene Broker, MD Patient Care Team: Myrlene Broker, MD as PCP - General (Internal Medicine) Tonny Bollman, MD as PCP - Cardiology (Cardiology) Ernesto Rutherford, MD as Consulting  Physician (Ophthalmology) Coralyn Helling, MD (Pulmonary Disease) Kalman Shan, MD (Pulmonary Disease) Meryl Dare, MD (Gastroenterology) Serena Colonel, MD (Otolaryngology)  This Provider for this visit: Treatment Team:  Attending Provider: Kalman Shan, MD    07/30/2022 -   Chief Complaint  Patient presents with   Follow-up    Increased cough and SOB      HPI [name] [name] 82 y.o. -returns in follow-up.  Personally not seen in over a year.  In July 2023 she saw Rikki Spearing for asthma exacerbation given prednisone but then she started getting worse again.  She is really not clear as to how long she has been getting worse it appears she has been chronically worse.  She says she is taking her Symbicort and Singulair regularly.  Nevertheless she is having a lot of sniffles in her nose and sinus drainage.  She is also quite sad.  A year ago son got disabled from stroke and is on a tracheostomy in a nursing home in [place].  She is  frustrated with the situation he has bedsores he is not communicating.  He is now able to sit and he is off the ventilator.  She is trying to get him back to [place].  No ACT score available but exam nitric oxide is higher than before.  In the past she has rejected the idea of having biologic therapy for asthma.  This is particularly after her not a good experience with Xolair.  We discussed Har[facility] Challenger again.  She is open to the idea at this point.  Her exam nitric oxide today was extremely high     OV 03/19/2023  Subjective:  Patient ID: [name] [name], female , DOB: 09-24-1941 , age 48 y.o. , MRN: [id number] , ADDRESS: [address] [place] [address] PCP Myrlene Broker, MD Patient Care Team: Myrlene Broker, MD as PCP - General (Internal Medicine) Tonny Bollman, MD as PCP - Cardiology (Cardiology) Ernesto Rutherford, MD as Consulting Physician (Ophthalmology) Coralyn Helling, MD (Pulmonary Disease) Kalman Shan, MD (Pulmonary Disease) Meryl Dare, MD (Gastroenterology) Serena Colonel, MD (Otolaryngology)  This Provider for this visit: Treatment Team:  Attending Provider: Kalman Shan, MD    03/19/2023 -   Chief Complaint  Patient presents with   Follow-up    Insurance is not covering Symbicort or xopenex.  Allergic reaction to Protonix (hives, itching)     HPI [name] [name] 82 y.o. -returns for follow-up.  She missed the last visit in December 2023.  Last seen in September 2023.  Review of the external record indicates she was admitted in the hospital in October 2023 around Halloween for hallucinations.  Urine culture at that time grew Klebsiella.  Psychiatry deemed that she had delirium and not depression.  She says she has been depressed overall.  Also increased anxiety.  She has essentially ran out of Symbicort.  She has 1 Symbicort with her that was given to her in the hospital.  Insurance not covering Symbicort.  Insurance also not covering Xopenex.  She has lost weight 121 pounds because of anxiety and depression and poor appetite.  In the hospital she is wheezing.  She is not her usual cheerful self but she denies any suicidal or hallucinatory thoughts.  No homicidal thoughts.  No intent of self-harm.       OV 10/03/2023  Subjective:  Patient ID: [name] [name], female , DOB: [birth year] , age 82 y.o. , MRN: [id number] , ADDRESS: [address] [place] [address] PCP Myrlene Broker, MD Patient Care Team: Myrlene Broker, MD as PCP - General (Internal Medicine) Tonny Bollman, MD as PCP - Cardiology (Cardiology) Ernesto Rutherford, MD as Consulting Physician (Ophthalmology) Coralyn Helling, MD (Inactive) (Pulmonary Disease) Kalman Shan, MD (Pulmonary Disease) Meryl Dare, MD (Gastroenterology) Serena Colonel, MD (Otolaryngology)  This Provider for this visit: Treatment Team:  Attending Provider: Kalman Shan, MD    10/03/2023 -   Chief  Complaint  Patient presents with   Follow-up    SOB.  Patient in hospital on 09/19/2023.  Changed Symbicort to [facility].  Sx worse.     HPI [name] [name] 82 y.o. -severe persistent asthmatic with elevated IgE and blood eosinophils  Presents for follow-up.  When I last saw her a year ago I ordered CBC with differential and she agreed to do [place] but then she did not do it.  I saw her again in the spring 2024 and at that time gave her another prednisone.  Now most recently  she was admitted this month 2024 with flank pain and abdominal pain diagnosed with UTI and AKI.  She needs urologic evaluation.  CT chest report below.  Fortunately the lung images do not show any pneumonia or pulmonary fibrosis but if she is she does have some nodularity and bronchiectasis that is old.  Reviewed the labs indicate her blood eosinophils are not crossed thousand cells per cubic millimeter.  It is at 1200.  Getting  ner hypereosinophilic syndrome levels.  Blood work also reviewed worsening anemia and kidney injury.  These are from hospital related issues.  She is not in flareup but she tells me that her insurance denied her Symbicort and she is on [facility] which is not working well for her.  In the hospital they tried Pulmicort which helps for she wants to go to a nebulized strategy.  We again discussed biologic therapy.  I showed her the blood eosinophil levels.  She says she is now committed to trying biologic.  We went over various Biologics.  She prefers Harrington Challenger because its every 8 weeks.      Asthma Control Panel 03/31/2015 13:30 06/05/2015 11:00 05/17/2016 16:33 09/22/2017  01/16/2018  06/01/2018  09/09/2018  10/02/2020  07/30/2022  10/03/2023   Current Med Regimen            ACT        13    ACQ 5 point- 1 week. wtd avg score. <1.0 is good control 0.75-1.25 is grey zone. >1.25 poor control. Delta 0.5 is clinically meaningful     2.2   2.4 2.4 1.4     FeNO ppB 41 24 20 55 44 63   85 57  FeV1             IgE/EOS           Eos 1200 09/19/23  Planned intervention  for visit              CT Chest data from date: as be;w  - personally visualized and independently interpreted : yes but only lung imgae - my findings are: as below  IMPRESSION: 1. Markedly enlarged left renal pelvis, with abrupt caliber change of the ureteropelvic junction. No urinary tract calculi or other obstruction identified. Findings suggest chronic ureteropelvic junction stricture. This could be assessed for functional significance by nuclear scintigraphic renogram if desired. 2. Pulmonary hyperinflation and diffuse bilateral bronchial wall thickening, consistent with history of COPD. 3. Sigmoid diverticulosis without evidence of acute diverticulitis. 4. Coronary artery disease.   Aortic Atherosclerosis (ICD10-I70.0).     Electronically Signed   By: Jearld Lesch M.D.   On: 09/17/2023 12:45  PFT     Latest Ref Rng & Units 12/20/2015   12:06 PM  ILD indicators  FVC-Pre L 2.14  P  FVC-Predicted Pre % 109  P  FVC-Post L 2.15  P  FVC-Predicted Post % 110  P  TLC L 4.72  P  TLC Predicted % 102  P  DLCO uncorrected ml/min/mmHg 18.01  P  DLCO UNC %Pred % 89  P    P Preliminary result      LAB RESULTS last 96 hours No results found.  LAB RESULTS last 90 days Recent Results (from the past 2160 hour(s))  Lipid panel     Status: Abnormal   Collection Time: 09/08/23  3:25 PM  Result Value Ref Range   Cholesterol 226 (H) 0 - 200 mg/dL    Comment: ATP III Classification  Desirable:  < 200 mg/dL               Borderline High:  200 - 239 mg/dL          High:  > = 161 mg/dL   Triglycerides 096.0 0.0 - 149.0 mg/dL    Comment: Normal:  <454 mg/dLBorderline High:  150 - 199 mg/dL   HDL 09.81 >19.14 mg/dL   VLDL 78.2 0.0 - 95.6 mg/dL   LDL Cholesterol 213 (H) 0 - 99 mg/dL   Total CHOL/HDL Ratio 5     Comment:                Men          Women1/2 Average Risk     3.4          3.3Average Risk          5.0           4.42X Average Risk          9.6          7.13X Average Risk          15.0          11.0                       NonHDL 178.56     Comment: NOTE:  Non-HDL goal should be 30 mg/dL higher than patient's LDL goal (i.e. LDL goal of < 70 mg/dL, would have non-HDL goal of < 100 mg/dL)  Comprehensive metabolic panel     Status: Abnormal   Collection Time: 09/08/23  3:25 PM  Result Value Ref Range   Sodium 138 135 - 145 mEq/L   Potassium 4.2 3.5 - 5.1 mEq/L   Chloride 107 96 - 112 mEq/L   CO2 25 19 - 32 mEq/L   Glucose, Bld 103 (H) 70 - 99 mg/dL   BUN 29 (H) 6 - 23 mg/dL   Creatinine, Ser 0.86 (H) 0.40 - 1.20 mg/dL   Total Bilirubin 0.4 0.2 - 1.2 mg/dL   Alkaline Phosphatase 66 39 - 117 U/L   AST 14 0 - 37 U/L   ALT 7 0 - 35 U/L   Total Protein 7.8 6.0 - 8.3 g/dL   Albumin 3.9 3.5 - 5.2 g/dL   GFR 57.84 (L) >69.62 mL/min    Comment: Calculated using the CKD-EPI Creatinine Equation (2021)   Calcium 10.8 (H) 8.4 - 10.5 mg/dL  CBC     Status: Abnormal   Collection Time: 09/08/23  3:25 PM  Result Value Ref Range   WBC 8.3 4.0 - 10.5 K/uL   RBC 4.04 3.87 - 5.11 Mil/uL   Platelets 213.0 150.0 - 400.0 K/uL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

## 2023-10-03 NOTE — Patient Instructions (Addendum)
ICD-10-CM   1. Severe persistent asthma without complication  J45.50     2. Eosinophilia, unspecified type  D72.10     3. History of anemia  Z86.2     4. AKI (acute kidney injury) (HCC)  N17.9        = Recent hospitalization for urologic issues causing kidney injury and also worsening anemia - Currently asthma is not in flareup but ongoing severe current symptoms -Recently blood eosinophils have reached extremely high levels and contributing to airway discomfort  -CT scan of the chest relatively benign other than chronic findings -Symbicort denied by insurance   Plan -Check CBC with differential and c chemistry blood work  -Monitor anemia and kidney function and blood eosinophils - continue  singulair scheduled  -Continue  albuterol as needed -Do DuoNeb 4 times daily & -Do pulmicort nebulizer 2 times daily  -CMA to send in prescription -Start paperwork for Harrington Challenger  -Other issues keep up with the urologic appointment       Follow-up - 8 weeks to see nurse practitioner face-to-face -16 weeks to see Dr. Marchelle Gearing

## 2023-10-03 NOTE — Telephone Encounter (Signed)
Please advise on change requested by pharmacy.

## 2023-10-03 NOTE — Telephone Encounter (Signed)
Patient here today for OV with Dr. Marchelle Gearing.  Recent admit to the hospital.  During stay, patient's medication was changed from Symbicort to [facility].  Since being discharged from the hospital, patient has been having an asthma/COPD exacerbation.  She starting using her daughters Symbicort and is doing much better.  Had OV today with Dr. Marchelle Gearing.  Patients daughter is here at OV with her.  States insurance is not covering Symbicort and has changed to [employer].  Per patient, Elwin Sleight does not help with asthma and COPD symptoms.  Patient would like to get a prior authorization through her insurance to get Symbicort approved as this medication helps very much.  Please advise if we can do a PA for Symbicort 160 mcg.

## 2023-10-07 NOTE — Telephone Encounter (Signed)
Per Dr. Sherene Sires, Elwin Sleight and Symbicort are interchangeable.  Ok to change back to Symbicort 160 mg.  Will send to prior auth team per patient request.  Elwin Sleight is making patient feel sick and is not helping with respiratory symptoms.  Cough and wheeze is worse.  Patient is doing well when taking Symbicort 160 mcg  Please try to get PA for Symbicort.

## 2023-10-08 ENCOUNTER — Other Ambulatory Visit (HOSPITAL_COMMUNITY): Payer: Self-pay

## 2023-10-10 ENCOUNTER — Other Ambulatory Visit (HOSPITAL_COMMUNITY): Payer: Self-pay

## 2023-10-10 ENCOUNTER — Telehealth: Payer: Self-pay

## 2023-10-10 NOTE — Telephone Encounter (Signed)
PA request has been Submitted. New Encounter created for follow up. For additional info see Pharmacy Prior Auth telephone encounter from 10-10-2023.

## 2023-10-10 NOTE — Telephone Encounter (Signed)
Pharmacy Patient Advocate Encounter   Received notification from Pt Calls Messages that prior authorization for Symbicort 160-4.5MCG/ACT aerosol is required/requested.   Insurance verification completed.   The patient is insured through [facility] .   Per test claim: PA required; PA submitted to above mentioned insurance via CoverMyMeds Key/confirmation #/EOC [id number] Status is pending

## 2023-10-13 ENCOUNTER — Encounter: Payer: Self-pay | Admitting: Internal Medicine

## 2023-10-13 ENCOUNTER — Ambulatory Visit (INDEPENDENT_AMBULATORY_CARE_PROVIDER_SITE_OTHER): Payer: Medicare Other | Admitting: Internal Medicine

## 2023-10-13 VITALS — BP 158/102 | HR 85 | Temp 98.4°F | Ht 64.5 in | Wt 119.0 lb

## 2023-10-13 DIAGNOSIS — I10 Essential (primary) hypertension: Secondary | ICD-10-CM

## 2023-10-13 DIAGNOSIS — J4550 Severe persistent asthma, uncomplicated: Secondary | ICD-10-CM

## 2023-10-13 DIAGNOSIS — F319 Bipolar disorder, unspecified: Secondary | ICD-10-CM | POA: Diagnosis not present

## 2023-10-13 DIAGNOSIS — E43 Unspecified severe protein-calorie malnutrition: Secondary | ICD-10-CM | POA: Diagnosis not present

## 2023-10-13 DIAGNOSIS — K118 Other diseases of salivary glands: Secondary | ICD-10-CM

## 2023-10-13 DIAGNOSIS — N179 Acute kidney failure, unspecified: Secondary | ICD-10-CM | POA: Diagnosis not present

## 2023-10-13 DIAGNOSIS — N3000 Acute cystitis without hematuria: Secondary | ICD-10-CM | POA: Diagnosis not present

## 2023-10-13 LAB — COMPREHENSIVE METABOLIC PANEL
ALT: 14 U/L (ref 0–35)
AST: 21 U/L (ref 0–37)
Albumin: 4 g/dL (ref 3.5–5.2)
Alkaline Phosphatase: 61 U/L (ref 39–117)
BUN: 44 mg/dL — ABNORMAL HIGH (ref 6–23)
CO2: 22 meq/L (ref 19–32)
Calcium: 10.8 mg/dL — ABNORMAL HIGH (ref 8.4–10.5)
Chloride: 108 meq/L (ref 96–112)
Creatinine, Ser: 2.01 mg/dL — ABNORMAL HIGH (ref 0.40–1.20)
GFR: 23 mL/min — ABNORMAL LOW (ref >60.00)
Glucose, Bld: 89 mg/dL (ref 70–99)
Potassium: 4.3 meq/L (ref 3.5–5.1)
Sodium: 137 meq/L (ref 135–145)
Total Bilirubin: 0.4 mg/dL (ref 0.2–1.2)
Total Protein: 7.7 g/dL (ref 6.0–8.3)

## 2023-10-13 MED ORDER — HYDRALAZINE HCL 25 MG PO TABS: 25.0000 mg | ORAL_TABLET | Freq: Two times a day (BID) | ORAL | 3 refills | Status: AC

## 2023-10-13 MED ORDER — EPINEPHRINE 0.3 MG/0.3ML IJ SOAJ: INTRAMUSCULAR | 0 refills | Status: AC

## 2023-10-13 NOTE — Telephone Encounter (Signed)
Pharmacy Patient Advocate Encounter  Received notification from [facility] that Prior Authorization for Symbicort 160-4.5MCG/ACT aerosol  has been APPROVED from 10/10/2023 to 11/10/2098   PA #/Case ID/Reference #: [id number]

## 2023-10-13 NOTE — Patient Instructions (Signed)
We will check an ultrasound of the neck in 3-6 months.

## 2023-10-13 NOTE — Progress Notes (Unsigned)
   Subjective:   Patient ID: [name] [name], female    DOB: [birth year], 82 y.o.   MRN: [id number]  HPI The patient is an 82 YO female coming in for hospital follow up (she was admitted to [facility] for urethral stricture and AKI, given fluids with some minimal improvement in renal function and high BP, medications were changed during hospital stay). She is not taking hydralazine TID as this is making her dizzy. She has not taken yet today as she was coming here. She is having some forgetfulness and this is about the same as before hospital. Family with her helps to provide history. She is not aware of the changes. She is sleeping fine. She is concerned about taking too many medications and is taking seroquel at night time. They did not fill hydroxyzine and have not taken.   PMH, [facility], social history reviewed and updated.  Discharge medication reconciliation done and med list updated.   Review of Systems  Constitutional:  Positive for activity change, appetite change and fatigue.  HENT: Negative.    Eyes: Negative.   Respiratory:  Negative for cough, chest tightness and shortness of breath.   Cardiovascular:  Negative for chest pain, palpitations and leg swelling.  Gastrointestinal:  Negative for abdominal distention, abdominal pain, constipation, diarrhea, nausea and vomiting.  Musculoskeletal:  Positive for arthralgias and myalgias.  Skin: Negative.   Neurological: Negative.   Psychiatric/Behavioral: Negative.      Objective:  Physical Exam Constitutional:      Appearance: She is well-developed.     Comments: Looks weller than last visit but still chronically ill appearing  HENT:     Head: Normocephalic and atraumatic.  Cardiovascular:     Rate and Rhythm: Normal rate and regular rhythm.  Pulmonary:     Effort: Pulmonary effort is normal. No respiratory distress.     Breath sounds: Wheezing and rhonchi present. No rales.     Comments: Stable lung exam from baseline Abdominal:      General: Bowel sounds are normal. There is no distension.     Palpations: Abdomen is soft.     Tenderness: There is no abdominal tenderness. There is no rebound.  Musculoskeletal:     Cervical back: Normal range of motion.  Skin:    General: Skin is warm and dry.  Neurological:     Mental Status: She is alert and oriented to person, place, and time.     Coordination: Coordination normal.     Vitals:   10/13/23 0925 10/13/23 0934  BP: (!) 158/102 (!) 158/102  Pulse: 85   Temp: 98.4 F (36.9 C)   TempSrc: Oral   SpO2: 98%   Weight: 119 lb (54 kg)   Height: 5' 4.5" (1.638 m)     Assessment & Plan:  Visit time 20 minutes in face to face communication with patient and coordination of care, additional 25 minutes spent in record review, coordination or care, ordering tests, communicating/referring to other healthcare professionals, documenting in medical records all on the same day of the visit for total time 45 minutes spent on the visit.

## 2023-10-15 ENCOUNTER — Encounter: Payer: Self-pay | Admitting: Internal Medicine

## 2023-10-15 NOTE — Assessment & Plan Note (Signed)
She is struggling with this significantly given the cost and coverage of her inhalers. She is not able to get her medications consistently currently. Lung exam is at baseline and she denies struggles. Pulmonary is working on medication coverage currently.

## 2023-10-15 NOTE — Assessment & Plan Note (Signed)
She is trying to do ensure daily to help with weight gain but struggles with the financial cost of this. She is encouraged to try this as often as possible to help nutritional status. With her severe lung disease her caloric intake she has higher needs.

## 2023-10-15 NOTE — Assessment & Plan Note (Signed)
Her mental health provider has asked them to see neurology for memory/mental status concerns. She is having upcoming apt about this. She is taking seroquel 100 mg at bedtime and depakote 250 mg at bedtime as well. Was prescribed hydroxyzine which they did not fill and have not taken which was removed from medication list.

## 2023-10-15 NOTE — Assessment & Plan Note (Signed)
Have discussed that biopsy was recommended and this is likely malignant. She is not interested in this currently which is reasonable given QOL. We have offered monitoring with [place] in 3-6 months to assess growth and she agrees with this. We will order when due.

## 2023-10-15 NOTE — Assessment & Plan Note (Signed)
BP is high today without medications. She is taking BP at home and running 130/80s with medication. She is getting lightheaded with TID hydralazine dosing. Will change to 25 mg BID and keep amlodipine 10 mg daily. Asked them to take meds prior to visit in future so we can get an accurate readings.

## 2023-10-15 NOTE — Assessment & Plan Note (Signed)
Checking CMP and this did improve some during hospital visit. Was rechecked by pulmonary and had improved slightly as well after discharge. She is not eating that well and not drinking that well. Adjust as needed. 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

## 2023-10-15 NOTE — Assessment & Plan Note (Signed)
Resolved during hospital and not having current symptoms. Advised if any mental status change or symptoms they can call and we will place orders for urine studies to catch early if recurrent.

## 2023-10-16 ENCOUNTER — Telehealth: Payer: Self-pay | Admitting: Pharmacist

## 2023-10-16 ENCOUNTER — Telehealth: Payer: Self-pay | Admitting: Internal Medicine

## 2023-10-16 DIAGNOSIS — J4550 Severe persistent asthma, uncomplicated: Secondary | ICD-10-CM

## 2023-10-16 NOTE — Telephone Encounter (Addendum)
Received new start paperwork for [employer]. Placed in PAP pending info folder  Submitted a Prior Authorization request to [facility] for [facility] via CoverMyMeds. Will update once we receive a response.  Key: [id number]

## 2023-10-16 NOTE — Telephone Encounter (Signed)
Pt is calling requesting for her nurse to reach out to her at her earliest convenience. She did not specifically say a reason she just will like for her nurse to call her @ [phone]. PLEASE ADVISE, Thanks.

## 2023-10-17 NOTE — Telephone Encounter (Signed)
No questions or concerns

## 2023-10-18 ENCOUNTER — Encounter (HOSPITAL_COMMUNITY): Payer: Self-pay | Admitting: *Deleted

## 2023-10-18 ENCOUNTER — Other Ambulatory Visit: Payer: Self-pay

## 2023-10-18 ENCOUNTER — Emergency Department (HOSPITAL_COMMUNITY)
Admission: EM | Admit: 2023-10-18 | Discharge: 2023-10-20 | Disposition: A | Discharge status: Home / Self Care | Payer: Medicare Other | Attending: Emergency Medicine | Admitting: Emergency Medicine

## 2023-10-18 DIAGNOSIS — F22 Delusional disorders: Secondary | ICD-10-CM | POA: Diagnosis not present

## 2023-10-18 DIAGNOSIS — D649 Anemia, unspecified: Secondary | ICD-10-CM | POA: Insufficient documentation

## 2023-10-18 DIAGNOSIS — J45909 Unspecified asthma, uncomplicated: Secondary | ICD-10-CM | POA: Insufficient documentation

## 2023-10-18 DIAGNOSIS — Z87891 Personal history of nicotine dependence: Secondary | ICD-10-CM | POA: Insufficient documentation

## 2023-10-18 DIAGNOSIS — N289 Disorder of kidney and ureter, unspecified: Secondary | ICD-10-CM | POA: Insufficient documentation

## 2023-10-18 DIAGNOSIS — Z91040 Latex allergy status: Secondary | ICD-10-CM | POA: Insufficient documentation

## 2023-10-18 DIAGNOSIS — R451 Restlessness and agitation: Secondary | ICD-10-CM | POA: Diagnosis not present

## 2023-10-18 DIAGNOSIS — Z79899 Other long term (current) drug therapy: Secondary | ICD-10-CM | POA: Diagnosis not present

## 2023-10-18 DIAGNOSIS — F319 Bipolar disorder, unspecified: Secondary | ICD-10-CM | POA: Diagnosis not present

## 2023-10-18 DIAGNOSIS — J449 Chronic obstructive pulmonary disease, unspecified: Secondary | ICD-10-CM | POA: Diagnosis not present

## 2023-10-18 DIAGNOSIS — F29 Unspecified psychosis not due to a substance or known physiological condition: Secondary | ICD-10-CM | POA: Diagnosis not present

## 2023-10-18 DIAGNOSIS — I10 Essential (primary) hypertension: Secondary | ICD-10-CM | POA: Insufficient documentation

## 2023-10-18 LAB — URINALYSIS, W/ REFLEX TO CULTURE (INFECTION SUSPECTED)
Bilirubin Urine: NEGATIVE
Glucose, UA: NEGATIVE mg/dL
Ketones, ur: NEGATIVE mg/dL
Nitrite: NEGATIVE
Protein, ur: 30 mg/dL — AB
Specific Gravity, Urine: 1.014 (ref 1.005–1.030)
pH: 5 (ref 5.0–8.0)

## 2023-10-18 LAB — ACETAMINOPHEN LEVEL: Acetaminophen (Tylenol), Serum: <10 ug/mL — ABNORMAL LOW (ref 10–30)

## 2023-10-18 LAB — CBC WITH DIFFERENTIAL/PLATELET
Abs Immature Granulocytes: 0.03 10*3/uL (ref 0.00–0.07)
Basophils Absolute: 0.1 10*3/uL (ref 0.0–0.1)
Basophils Relative: 1 %
Eosinophils Absolute: 1 10*3/uL — ABNORMAL HIGH (ref 0.0–0.5)
Eosinophils Relative: 12 %
HCT: 34 % — ABNORMAL LOW (ref 36.0–46.0)
Hemoglobin: 10.5 g/dL — ABNORMAL LOW (ref 12.0–15.0)
Immature Granulocytes: 0 %
Lymphocytes Relative: 19 %
Lymphs Abs: 1.5 10*3/uL (ref 0.7–4.0)
MCH: 28.2 pg (ref 26.0–34.0)
MCHC: 30.9 g/dL (ref 30.0–36.0)
MCV: 91.4 fL (ref 80.0–100.0)
Monocytes Absolute: 0.7 10*3/uL (ref 0.1–1.0)
Monocytes Relative: 9 %
Neutro Abs: 4.5 10*3/uL (ref 1.7–7.7)
Neutrophils Relative %: 59 %
Platelets: 241 10*3/uL (ref 150–400)
RBC: 3.72 MIL/uL — ABNORMAL LOW (ref 3.87–5.11)
RDW: 13.5 % (ref 11.5–15.5)
WBC: 7.8 10*3/uL (ref 4.0–10.5)
nRBC: 0 % (ref 0.0–0.2)

## 2023-10-18 LAB — ETHANOL: Alcohol, Ethyl (B): <10 mg/dL (ref <10)

## 2023-10-18 LAB — COMPREHENSIVE METABOLIC PANEL
ALT: 14 U/L (ref 0–44)
AST: 22 U/L (ref 15–41)
Albumin: 3.8 g/dL (ref 3.5–5.0)
Alkaline Phosphatase: 64 U/L (ref 38–126)
Anion gap: 9 (ref 5–15)
BUN: 40 mg/dL — ABNORMAL HIGH (ref 8–23)
CO2: 18 mmol/L — ABNORMAL LOW (ref 22–32)
Calcium: 11.1 mg/dL — ABNORMAL HIGH (ref 8.9–10.3)
Chloride: 113 mmol/L — ABNORMAL HIGH (ref 98–111)
Creatinine, Ser: 2.3 mg/dL — ABNORMAL HIGH (ref 0.44–1.00)
GFR, Estimated: 21 mL/min — ABNORMAL LOW (ref >60)
Glucose, Bld: 106 mg/dL — ABNORMAL HIGH (ref 70–99)
Potassium: 4.2 mmol/L (ref 3.5–5.1)
Sodium: 140 mmol/L (ref 135–145)
Total Bilirubin: 0.6 mg/dL (ref <1.2)
Total Protein: 7.7 g/dL (ref 6.5–8.1)

## 2023-10-18 LAB — RAPID URINE DRUG SCREEN, HOSP PERFORMED
Amphetamines: NOT DETECTED
Barbiturates: NOT DETECTED
Benzodiazepines: NOT DETECTED
Cocaine: NOT DETECTED
Opiates: NOT DETECTED
Tetrahydrocannabinol: NOT DETECTED

## 2023-10-18 LAB — SALICYLATE LEVEL: Salicylate Lvl: <7 mg/dL — ABNORMAL LOW (ref 7.0–30.0)

## 2023-10-18 NOTE — ED Provider Triage Note (Signed)
Emergency Medicine Provider Triage Evaluation Note  [name] [name] , a 82 y.o. female  was evaluated in triage.  Pt complains of paranoia and agitation.  IVC'd back in October for same, has never returned to baseline since then.  Has not slept in about 3 days, continuously pacing, refusing to take medications, etc.  Has visited psychiatry and PCP with some medication changes but not making a lot of progress.  No fever or other infectious symptoms.  Being evaluated for possible dementia as well.  Review of Systems  Positive: Paranoia, agitation Negative: fever  Physical Exam  BP (!) 157/124   Pulse 78   Temp 98.7 F (37.1 C) (Oral)   Resp 16   Ht 5\' 4"  (1.626 m)   Wt 54 kg   SpO2 99%   BMI 20.43 kg/m  Gen:   Awake, no distress   Resp:  Normal effort  MSK:   Moves extremities without difficulty  Other:  Continuously talking throughout exam, some is non-sensical, seems a bit manic  Medical Decision Making  Medically screening exam initiated at 11:04 PM.  Appropriate orders placed.  Peggye Fothergill was informed that the remainder of the evaluation will be completed by another provider, this initial triage assessment does not replace that evaluation, and the importance of remaining in the ED until their evaluation is complete.  Paranoid, agitation.  Hx of same.  Issues taking medications recently, not sleeping, etc.  Seems a bit manic on brief exam.  Will check EKG, labs, UA.   Garlon Hatchet, PA-C 10/18/23 2312

## 2023-10-18 NOTE — ED Triage Notes (Signed)
The pt is more paranoid than usual  not sleeping since Thursday decreased appetite she has stopped taking her meds she thinks  for one year that she's being poisoned  she has busy brain  she has been like this somewhat sinc 2023

## 2023-10-19 DIAGNOSIS — F29 Unspecified psychosis not due to a substance or known physiological condition: Secondary | ICD-10-CM

## 2023-10-19 DIAGNOSIS — F22 Delusional disorders: Secondary | ICD-10-CM | POA: Diagnosis not present

## 2023-10-19 DIAGNOSIS — F319 Bipolar disorder, unspecified: Secondary | ICD-10-CM | POA: Diagnosis not present

## 2023-10-19 LAB — VALPROIC ACID LEVEL: Valproic Acid Lvl: <10 ug/mL — ABNORMAL LOW (ref 50.0–100.0)

## 2023-10-19 MED ORDER — AMLODIPINE BESYLATE 5 MG PO TABS
10.0000 mg | ORAL_TABLET | Freq: Every day | ORAL | Status: DC
  Administered 2023-10-19 – 2023-10-20 (×2): 10 mg via ORAL
  Filled 2023-10-19 (×2): qty 2

## 2023-10-19 MED ORDER — BUDESONIDE 0.25 MG/2ML IN SUSP
0.2500 mg | Freq: Two times a day (BID) | RESPIRATORY_TRACT | Status: DC
  Administered 2023-10-19 – 2023-10-20 (×2): 0.25 mg via RESPIRATORY_TRACT
  Filled 2023-10-19 (×5): qty 2

## 2023-10-19 MED ORDER — OLOPATADINE HCL 0.1 % OP SOLN
1.0000 [drp] | Freq: Every day | OPHTHALMIC | Status: DC
  Administered 2023-10-19 – 2023-10-20 (×2): 1 [drp] via OPHTHALMIC
  Filled 2023-10-19: qty 5

## 2023-10-19 MED ORDER — QUETIAPINE FUMARATE 100 MG PO TABS
100.0000 mg | ORAL_TABLET | Freq: Every day | ORAL | Status: DC
  Administered 2023-10-19 (×2): 100 mg via ORAL
  Filled 2023-10-19 (×2): qty 1

## 2023-10-19 MED ORDER — MONTELUKAST SODIUM 10 MG PO TABS
10.0000 mg | ORAL_TABLET | Freq: Every day | ORAL | Status: DC
  Administered 2023-10-19 (×2): 10 mg via ORAL
  Filled 2023-10-19 (×2): qty 1

## 2023-10-19 MED ORDER — ZIPRASIDONE MESYLATE 20 MG IM SOLR: 20.0000 mg | INTRAMUSCULAR | Status: DC | PRN

## 2023-10-19 MED ORDER — DIVALPROEX SODIUM ER 250 MG PO TB24
250.0000 mg | ORAL_TABLET | Freq: Every day | ORAL | Status: DC
  Administered 2023-10-19 (×2): 250 mg via ORAL
  Filled 2023-10-19 (×3): qty 1

## 2023-10-19 MED ORDER — MOMETASONE FURO-FORMOTEROL FUM 200-5 MCG/ACT IN AERO
2.0000 | INHALATION_SPRAY | Freq: Two times a day (BID) | RESPIRATORY_TRACT | Status: DC
  Administered 2023-10-19 (×2): 2 via RESPIRATORY_TRACT
  Filled 2023-10-19: qty 8.8

## 2023-10-19 MED ORDER — OLANZAPINE 5 MG PO TBDP: 5.0000 mg | ORAL_TABLET | Freq: Three times a day (TID) | ORAL | Status: DC | PRN

## 2023-10-19 MED ORDER — LORAZEPAM 1 MG PO TABS
1.0000 mg | ORAL_TABLET | ORAL | Status: AC | PRN
  Administered 2023-10-19: 1 mg via ORAL
  Filled 2023-10-19: qty 1

## 2023-10-19 MED ORDER — HYDRALAZINE HCL 25 MG PO TABS
25.0000 mg | ORAL_TABLET | Freq: Once | ORAL | Status: AC
  Administered 2023-10-19: 25 mg via ORAL

## 2023-10-19 MED ORDER — HYDRALAZINE HCL 25 MG PO TABS
25.0000 mg | ORAL_TABLET | Freq: Two times a day (BID) | ORAL | Status: DC
  Administered 2023-10-19 – 2023-10-20 (×3): 25 mg via ORAL
  Filled 2023-10-19 (×4): qty 1

## 2023-10-19 MED ORDER — PANTOPRAZOLE SODIUM 40 MG PO TBEC
40.0000 mg | DELAYED_RELEASE_TABLET | Freq: Every day | ORAL | Status: DC
  Administered 2023-10-19 – 2023-10-20 (×2): 40 mg via ORAL
  Filled 2023-10-19 (×2): qty 1

## 2023-10-19 MED ORDER — IPRATROPIUM-ALBUTEROL 0.5-2.5 (3) MG/3ML IN SOLN
3.0000 mL | Freq: Four times a day (QID) | RESPIRATORY_TRACT | Status: DC
  Administered 2023-10-19 – 2023-10-20 (×4): 3 mL via RESPIRATORY_TRACT
  Filled 2023-10-19 (×4): qty 3

## 2023-10-19 NOTE — Consult Note (Addendum)
[facility] Consult Note  Patient Name: [name] [name] MRN: [id number] DOB: 19-Aug-1941 DATE OF Consult: 10/19/2023  PRIMARY PSYCHIATRIC DIAGNOSES  Psychosis, Unspecified 2.  Bipolar I Disorder with Psychotic Features (by history) 3.  R/O Cognitive Impairment    RECOMMENDATIONS  Recommendations: Medication recommendations: ED physician has restarted patient's home medication regimen -- Continue Depakote ER 250mg  po at bedtime for mood stabilization, bipolar disorder -- Continue Seroquel 100mg  po at bedtime for delusions, paranoia (may consider decreasing due to age and if causing over-sedation).   Non-Medication/therapeutic recommendations:  -- Monitor symptoms. Monitor for oversedation -- VPA level subtherapeutic/ non-existent  -- Plan to hold until A.M for re-assessment and family collateral information   Is inpatient psychiatric hospitalization recommended for this patient? Undetermined at this time. Plan to re-assess later this morning.   Communication: Treatment team members (and family members if applicable) who were involved in treatment/care discussions and planning, and with whom we spoke or engaged with via secure text/chat, include the following: Dr. Preston Fleeting and ED team   Thank you for involving [place] in the care of this patient. If you have any additional questions or concerns, please call [phone] and ask for me or the provider on-call.  TELEPSYCHIATRY ATTESTATION & CONSENT  As the provider for this telehealth consult, I attest that I verified the patient's identity using two separate identifiers, introduced myself to the patient, provided my credentials, disclosed my location, and performed this encounter via a HIPAA-compliant, real-time, face-to-face, two-way, interactive audio and video platform and with the full consent and agreement of the patient (or guardian as applicable.)  Patient physical location: ED in [facility]  Telehealth provider physical location:  home office in state of [place].  Video start time: 0244 (Central Time) Video end time: 0258 (Central Time)  IDENTIFYING DATA  [name] [name] is a 82 y.o. year-old female for whom a psychiatric consultation has been ordered by the primary provider. The patient was identified using two separate identifiers.  CHIEF COMPLAINT/REASON FOR CONSULT  Agitation, Paranoia    HISTORY OF PRESENT ILLNESS (HPI)  The patient is an 82yo female who presented to the emergency department last night, accompanied by her daughter, with concern for increasing agitation and paranoid ideations. Per report, daughter mentioned behavioral changes shifting in the evening around 5:30PM and continuing throughout night. Report states "she thinks that there are mass demonstrations outside the house and they were there to hurt her". Also noted, noncompliance with medications. History of Bipolar I Disorder.   Attempted to assess patient this morning for psychiatric consultation. She appears tired, having difficulty conversing due to fatigue. RN assist with conversation. Patient states "my daughter said I was having problems getting along with people". Patient states her daughter is "basing it on something that happened a long time ago". Patient states she "never" had issues getting along with anyone. Patient begins to describe the events occurring outside her home however falls asleep mid-conversation. Patient states she stopped taking her medications because they make her feel "groggy" and they disrupt her functionality. Believes she quit taking her medications about 3 days ago. Patient having difficulty answering further assessment questions at this time. Will allow her to rest and re-assess at later time.     PAST PSYCHIATRIC HISTORY  Per chart review, patient with at least 3 inpatient hospitalizations. One hospitalization after the loss of 2 children. Other was for suicide attempt by overdose on medications.  History of  Bipolar I Disorder with Psychotic Features Outpatient psychiatrist  appointment 09/05/2023 reported need for [facility] testing. Also reported at this time, patient was taking: increased Zyprexa to 10mg  at bedtime, Continue Depakote ER 250mg  po at bedtime. Recommended dementia work-up.  Psychiatrist- Dr. Morrie Sheldon   09/19/2023 follow up outpatient psychiatrist- D/C Zyprexa. Seroquel added 100mg  at bedtime for paranoia.   Otherwise as per HPI above.  PAST MEDICAL HISTORY  Past Medical History:  Diagnosis Date   Allergic asthma    h/o   ALLERGIC RHINITIS    Anxiety    Bipolar 1 disorder (HCC)    Bronchiectasis    h/o   Chronic obstructive asthma    PFT 11/06/10 - FEV1 1.24/ 0.62; FEV1/FVC 0.56, TLC 0.78; DLCO 0.75   Chronic rhinosinusitis    Colon polyp, hyperplastic 02/2003, 03/2014   COPD (chronic obstructive pulmonary disease) (HCC)    Gastroparesis 11/11/2008   GERD (gastroesophageal reflux disease)    Helicobacter pylori gastritis 02/09/2009   partially treated   Hiatal hernia    Hyperlipidemia    Hypertension    Osteoporosis    Sleep apnea      HOME MEDICATIONS  Facility Ordered Medications  Medication   OLANZapine zydis (ZYPREXA) disintegrating tablet 5 mg   And   [COMPLETED] LORazepam (ATIVAN) tablet 1 mg   And   ziprasidone (GEODON) injection 20 mg   amLODipine (NORVASC) tablet 10 mg   budesonide (PULMICORT) nebulizer solution 0.25 mg   divalproex (DEPAKOTE ER) 24 hr tablet 250 mg   hydrALAZINE (APRESOLINE) tablet 25 mg   ipratropium-albuterol (DUONEB) 0.5-2.5 (3) MG/3ML nebulizer solution 3 mL   mometasone-formoterol (DULERA) 200-5 MCG/ACT inhaler 2 puff   montelukast (SINGULAIR) tablet 10 mg   olopatadine (PATANOL) 0.1 % ophthalmic solution 1 drop   pantoprazole (PROTONIX) EC tablet 40 mg   QUEtiapine (SEROQUEL) tablet 100 mg   [COMPLETED] hydrALAZINE (APRESOLINE) tablet 25 mg   PTA Medications  Medication Sig   Olopatadine HCl (PATADAY OP) Place 1 drop into both  eyes daily.   montelukast (SINGULAIR) 10 MG tablet Take 1 tablet (10 mg total) by mouth at bedtime.   pantoprazole (PROTONIX) 40 MG tablet TAKE 1 TABLET BY MOUTH EVERY DAY   mometasone-formoterol (DULERA) 200-5 MCG/ACT AERO Inhale 2 puffs into the lungs 2 (two) times daily.   divalproex (DEPAKOTE ER) 250 MG 24 hr tablet Take 1 tablet (250 mg total) by mouth at bedtime.   QUEtiapine (SEROQUEL) 100 MG tablet Take 1 tablet (100 mg total) by mouth at bedtime.   amLODipine (NORVASC) 10 MG tablet TAKE 1 TABLET BY MOUTH EVERY DAY   budesonide (PULMICORT) 0.25 MG/2ML nebulizer solution Add one ample in nebulizer 2 times daily   ipratropium-albuterol (DUONEB) 0.5-2.5 (3) MG/3ML SOLN ADD ONE AMPULE IN NEBULIZER 4 TIMES DAILY   hydrALAZINE (APRESOLINE) 25 MG tablet Take 1 tablet (25 mg total) by mouth in the morning and at bedtime.   EPINEPHrine 0.3 mg/0.3 mL IJ SOAJ injection INJECT 0.3 MLS INTO THE MUSCLE ONCE FOR 1 DOSE.     ALLERGIES  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   Latex Anaphylaxis and Swelling   Albuterol Anxiety    Switched to xopenex   Advair Diskus [Fluticasone-Salmeterol]     Tingling in mouth/ears ringing   Diclofenac Sodium     REACTION: Hives   Diclofenac Sodium Swelling   Doxycycline Nausea And Vomiting   Dulera [Mometasone Furo-Formoterol Fum]     HA   Penicillins Swelling  Tongue swelling   Sulfonamide Derivatives     Tongue swelling    Tiotropium Itching and Other (See Comments)    dysuria   Tiotropium Bromide Monohydrate     Tongue/mouth itching Dysuria     SOCIAL & SUBSTANCE USE HISTORY  Social History   Socioeconomic History   Marital status: Divorced    Spouse name: Not on file   Number of children: 4   Years of education: Not on file   Highest education level: Not on file  Occupational History   Occupation: disabled    Comment: [employer] [place]: UNEMPLOYED   Tobacco Use   Smoking status: Former    Current packs/day: 0.00    Types: Cigarettes    Start date: 11/11/1961    Quit date: 11/11/1962    Years since quitting: 60.9   Smokeless tobacco: Never   Tobacco comments:    socially  Vaping Use   Vaping status: Never Used  Substance and Sexual Activity   Alcohol use: No   Drug use: No   Sexual activity: Never  Other Topics Concern   Not on file  Social History Narrative   4 brothers   4 sisters   Pt gets regular exercise   Moved in with daughter    Social Determinants of Health   Financial Resource Strain: Low Risk  (12/11/2022)   Overall Financial Resource Strain (CARDIA)    Difficulty of Paying Living Expenses: Not hard at all  Food Insecurity: No Food Insecurity (09/24/2023)   Hunger Vital Sign    Worried About Running Out of Food in the Last Year: Never true    Ran Out of Food in the Last Year: Never true  Transportation Needs: No Transportation Needs (09/24/2023)   PRAPARE - Administrator, Civil Service (Medical): No    Lack of Transportation (Non-Medical): No  Physical Activity: Inactive (12/11/2022)   Exercise Vital Sign    Days of Exercise per Week: 0 days    Minutes of Exercise per Session: 0 min  Stress: No Stress Concern Present (12/11/2022)   [employer] of Occupational Health - Occupational Stress Questionnaire    Feeling of Stress : Not at all  Social Connections: Socially Isolated (12/11/2022)   Social Connection and Isolation Panel [NHANES]    Frequency of Communication with Friends and Family: More than three times a week    Frequency of Social Gatherings with Friends and Family: Never    Attends Religious Services: Never    Database administrator or Organizations: No    Attends Banker Meetings: Never    Marital Status: Widowed   Social History   Tobacco Use  Smoking Status Former   Current packs/day: 0.00   Types: Cigarettes   Start date: 11/11/1961   Quit date: 11/11/1962    Years since quitting: 60.9  Smokeless Tobacco Never  Tobacco Comments   socially   Social History   Substance and Sexual Activity  Alcohol Use No   Social History   Substance and Sexual Activity  Drug Use No    Additional pertinent information: lives with daughter   FAMILY HISTORY  Family History  Problem Relation Age of Onset   Stroke Son 83       ischemic   Stroke Sister 46   Hypertension Son    Colon cancer Neg Hx    Throat cancer Neg Hx    Pancreatic cancer Neg Hx  Diabetes Neg Hx    Heart disease Neg Hx    Kidney disease Neg Hx    Liver disease Neg Hx    Family Psychiatric History (if known):  none disclosed at this time   MENTAL STATUS EXAM (MSE)  Presentation  General Appearance: Appropriate for Environment Eye Contact:Poor Speech:Garbled (slow, difficult to understand) Speech Volume:Decreased Handedness:No data recorded  Mood and Affect  Mood:-- (tired, fatigued) Affect:Constricted  Thought Process  Thought Processes:-- (linear at times; concrete some) Descriptions of Associations:Intact  Orientation:-- (oriented to person, place)  Thought Content:No data recorded History of Schizophrenia/Schizoaffective disorder:No data recorded Duration of Psychotic Symptoms:No data recorded Hallucinations:Hallucinations: -- (unable to assess)  Ideas of Reference:-- (unable to assess)  Suicidal Thoughts:Suicidal Thoughts: -- (unable to assess)  Homicidal Thoughts:Homicidal Thoughts: -- (unable to assess)   Sensorium  Memory:Recent Poor Judgment:Poor Insight:Poor  Executive Functions  Concentration:Poor Attention Span:Poor Recall:Poor Fund of Knowledge:Poor Language:Poor  Psychomotor Activity  Psychomotor Activity:Psychomotor Activity: Decreased  Assets  Assets:Housing; Research scientist (medical); Manufacturing systems engineer; Desire for Improvement  Sleep  Sleep:Sleep: Poor   VITALS  Blood pressure (!) 157/124, pulse 78, temperature 98.7 F (37.1 C),  temperature source Oral, resp. rate 16, height 5\' 4"  (1.626 m), weight 54 kg, SpO2 99%.  LABS  Admission on 10/18/2023  Component Date Value Ref Range Status   WBC 10/18/2023 7.8  4.0 - 10.5 K/uL Final   RBC 10/18/2023 3.72 (L)  3.87 - 5.11 MIL/uL Final   Hemoglobin 10/18/2023 10.5 (L)  12.0 - 15.0 g/dL Final   HCT 84/13/2440 34.0 (L)  36.0 - 46.0 % Final   MCV 10/18/2023 91.4  80.0 - 100.0 fL Final   MCH 10/18/2023 28.2  26.0 - 34.0 pg Final   MCHC 10/18/2023 30.9  30.0 - 36.0 g/dL Final   RDW 09/07/2535 13.5  11.5 - 15.5 % Final   Platelets 10/18/2023 241  150 - 400 K/uL Final   nRBC 10/18/2023 0.0  0.0 - 0.2 % Final   Neutrophils Relative % 10/18/2023 59  % Final   Neutro Abs 10/18/2023 4.5  1.7 - 7.7 K/uL Final   Lymphocytes Relative 10/18/2023 19  % Final   Lymphs Abs 10/18/2023 1.5  0.7 - 4.0 K/uL Final   Monocytes Relative 10/18/2023 9  % Final   Monocytes Absolute 10/18/2023 0.7  0.1 - 1.0 K/uL Final   Eosinophils Relative 10/18/2023 12  % Final   Eosinophils Absolute 10/18/2023 1.0 (H)  0.0 - 0.5 K/uL Final   Basophils Relative 10/18/2023 1  % Final   Basophils Absolute 10/18/2023 0.1  0.0 - 0.1 K/uL Final   Immature Granulocytes 10/18/2023 0  % Final   Abs Immature Granulocytes 10/18/2023 0.03  0.00 - 0.07 K/uL Final   Performed at [facility], [address], [place], [place] [address]   Sodium 10/18/2023 140  135 - 145 mmol/L Final   Potassium 10/18/2023 4.2  3.5 - 5.1 mmol/L Final   Chloride 10/18/2023 113 (H)  98 - 111 mmol/L Final   CO2 10/18/2023 18 (L)  22 - 32 mmol/L Final   Glucose, Bld 10/18/2023 106 (H)  70 - 99 mg/dL Final   Glucose reference range applies only to samples taken after fasting for at least 8 hours.   BUN 10/18/2023 40 (H)  8 - 23 mg/dL Final   Creatinine, Ser 10/18/2023 2.30 (H)  0.44 - 1.00 mg/dL Final   Calcium 47/42/5956 11.1 (H)  8.9 - 10.3 mg/dL Final   Total  Protein 10/18/2023 7.7  6.5 - 8.1 g/dL Final   Albumin 11/13/7251 3.8   3.5 - 5.0 g/dL Final   AST 66/44/0347 22  15 - 41 U/L Final   ALT 10/18/2023 14  0 - 44 U/L Final   Alkaline Phosphatase 10/18/2023 64  38 - 126 U/L Final   Total Bilirubin 10/18/2023 0.6  <1.2 mg/dL Final   GFR, Estimated 10/18/2023 21 (L)  >60 mL/min Final   Comment: (NOTE) Calculated using the CKD-EPI Creatinine Equation (2021)    Anion gap 10/18/2023 9  5 - 15 Final   Performed at [facility], [address] [place], [place] [address]   Alcohol, Ethyl (B) 10/18/2023 <10  <10 mg/dL Final   Comment: (NOTE) Lowest detectable limit for serum alcohol is 10 mg/dL.  For medical purposes only. Performed at [facility], [address] [place], [place] [address]    Opiates 10/18/2023 NONE DETECTED  NONE DETECTED Final   Cocaine 10/18/2023 NONE DETECTED  NONE DETECTED Final   Benzodiazepines 10/18/2023 NONE DETECTED  NONE DETECTED Final   Amphetamines 10/18/2023 NONE DETECTED  NONE DETECTED Final   Tetrahydrocannabinol 10/18/2023 NONE DETECTED  NONE DETECTED Final   Barbiturates 10/18/2023 NONE DETECTED  NONE DETECTED Final   Comment: (NOTE) DRUG SCREEN FOR MEDICAL PURPOSES ONLY.  IF CONFIRMATION IS NEEDED FOR ANY PURPOSE, NOTIFY LAB WITHIN 5 DAYS.  LOWEST DETECTABLE LIMITS FOR URINE DRUG SCREEN Drug Class                     Cutoff (ng/mL) Amphetamine and metabolites    1000 Barbiturate and metabolites    200 Benzodiazepine                 200 Opiates and metabolites        300 Cocaine and metabolites        300 THC                            50 Performed at [facility], [address] [place], [place] [address]    Acetaminophen (Tylenol), Serum 10/18/2023 <10 (L)  10 - 30 ug/mL Final   Comment: (NOTE) Therapeutic concentrations vary significantly. A range of 10-30 ug/mL  may be an effective concentration for many patients. However, some  are best treated at concentrations outside of this range. Acetaminophen concentrations >150 ug/mL at 4 hours after  ingestion  and >50 ug/mL at 12 hours after ingestion are often associated with  toxic reactions.  Performed at [facility], [address], [place], [place] [address]    Salicylate Lvl 10/18/2023 <7.0 (L)  7.0 - 30.0 mg/dL Final   Performed at [facility], [address] [place], [place] [address]   Valproic Acid Lvl 10/18/2023 <10 (L)  50.0 - 100.0 ug/mL Final   Comment: RESULT CONFIRMED BY MANUAL DILUTION Performed at [facility], [address], [place], [place] [address]    Specimen Source 10/18/2023 URINE, CLEAN CATCH   Final   Color, Urine 10/18/2023 YELLOW  YELLOW Final   APPearance 10/18/2023 HAZY (A)  CLEAR Final   Specific Gravity, Urine 10/18/2023 1.014  1.005 - 1.030 Final   pH 10/18/2023 5.0  5.0 - 8.0 Final   Glucose, UA 10/18/2023 NEGATIVE  NEGATIVE mg/dL Final   Hgb urine dipstick 10/18/2023 SMALL (A)  NEGATIVE Final   Bilirubin Urine 10/18/2023 NEGATIVE  NEGATIVE Final   Ketones, ur 10/18/2023 NEGATIVE  NEGATIVE mg/dL Final   Protein, ur 96/02/5408 30 (A)  NEGATIVE mg/dL Final   Nitrite 81/19/1478 NEGATIVE  NEGATIVE Final   Leukocytes,Ua 10/18/2023 TRACE (A)  NEGATIVE Final   RBC / HPF 10/18/2023 0-5  0 - 5 RBC/hpf Final   WBC, UA 10/18/2023 0-5  0 - 5 WBC/hpf Final   Comment:        Reflex urine culture not performed if WBC <=10, OR if Squamous epithelial cells >5. If Squamous epithelial cells >5 suggest recollection.    Bacteria, UA 10/18/2023 RARE (A)  NONE SEEN Final   Squamous Epithelial / HPF 10/18/2023 0-5  0 - 5 /HPF Final   Mucus 10/18/2023 PRESENT   Final   Hyaline Casts, UA 10/18/2023 PRESENT   Final   Performed at [facility], [address] [place], [place] [address]    PSYCHIATRIC REVIEW OF SYSTEMS (ROS)  ROS: Notable for the following relevant positive findings: Review of Systems  Psychiatric/Behavioral:  The patient has insomnia.        Unable to assess due to patient's condition     Additional findings:       Musculoskeletal: No abnormal movements observed      Gait & Station: Laying/Sitting      Pain Screening: Denies      Nutrition & Dental Concerns: No concerns disclosed at this time  RISK FORMULATION/ASSESSMENT  Is the patient experiencing any suicidal or homicidal ideations:        Explain if yes: Unable to assess at this time due to patient's condition  Protective factors considered for safety management: access to care, outpatient treatment, housing, family/ social support  Risk factors/concerns considered for safety management:  Prior attempt Physical illness/chronic pain Age over 97 Unmarried  Is there a Astronomer plan with the patient and treatment team to minimize risk factors and promote protective factors: Yes           Explain: Patient currently in the ED, medication management, comfort measures, will need to re-assess when patient more alert.  Is crisis care placement or psychiatric hospitalization recommended: Unable to determine at this time due to patient's condition.      Based on my current evaluation and risk assessment, patient is determined at this time to be at:  Low risk  *RISK ASSESSMENT Risk assessment is a dynamic process; it is possible that this patient's condition, and risk level, may change. This should be re-evaluated and managed over time as appropriate. Please re-consult psychiatric consult services if additional assistance is needed in terms of risk assessment and management. If your team decides to discharge this patient, please advise the patient how to best access emergency psychiatric services, or to call 911, if their condition worsens or they feel unsafe in any way.   Assunta Gambles, NP Telepsychiatry Consult Services

## 2023-10-19 NOTE — ED Notes (Signed)
Attempted TTS, pt sleepy and counselor unable to understand pt

## 2023-10-19 NOTE — ED Provider Notes (Addendum)
[place] EMERGENCY DEPARTMENT AT [facility] Provider Note   CSN: [id number] Arrival date & time: 10/18/23  2158     History  Chief Complaint  Patient presents with   Paranoid    [name] [name] is a 82 y.o. female.  The history is provided by the patient and a relative. The history is limited by the condition of the patient (Psychiatric disorder).  She has history of hypertension, hyperlipidemia, asthma/COPD, GERD, bipolar 1 disorder and is brought in by family member who relates that she has been having problems with increased agitation which tends to begin at about 5:30 PM and is worse through the night.  She has also been having paranoid ideation where she thinks that there are mass demonstrations outside the house and they were there to hurt her.  She also has not been taking her medications as prescribed.   Home Medications Prior to Admission medications   Medication Sig Start Date End Date Taking? Authorizing Provider  amLODipine (NORVASC) 10 MG tablet TAKE 1 TABLET BY MOUTH EVERY DAY 09/26/23   Myrlene Broker, MD  budesonide (PULMICORT) 0.25 MG/2ML nebulizer solution Add one ample in nebulizer 2 times daily 10/03/23   Kalman Shan, MD  divalproex (DEPAKOTE ER) 250 MG 24 hr tablet Take 1 tablet (250 mg total) by mouth at bedtime. 09/05/23   Eliseo Gum B, MD  EPINEPHrine 0.3 mg/0.3 mL IJ SOAJ injection INJECT 0.3 MLS INTO THE MUSCLE ONCE FOR 1 DOSE. 10/13/23   Myrlene Broker, MD  hydrALAZINE (APRESOLINE) 25 MG tablet Take 1 tablet (25 mg total) by mouth in the morning and at bedtime. 10/13/23   Myrlene Broker, MD  ipratropium-albuterol (DUONEB) 0.5-2.5 (3) MG/3ML SOLN ADD ONE AMPULE IN NEBULIZER 4 TIMES DAILY 10/03/23   Kalman Shan, MD  mometasone-formoterol (DULERA) 200-5 MCG/ACT AERO Inhale 2 puffs into the lungs 2 (two) times daily. 09/03/23   Kalman Shan, MD  montelukast (SINGULAIR) 10 MG tablet Take 1 tablet (10 mg total) by  mouth at bedtime. 03/19/23   Kalman Shan, MD  Olopatadine HCl (PATADAY OP) Place 1 drop into both eyes daily.    [provider]  pantoprazole (PROTONIX) 40 MG tablet TAKE 1 TABLET BY MOUTH EVERY DAY 08/04/23   Myrlene Broker, MD  QUEtiapine (SEROQUEL) 100 MG tablet Take 1 tablet (100 mg total) by mouth at bedtime. 09/19/23   Bobbye Morton, MD      Allergies    Influenza vaccines, Latex, Albuterol, Advair diskus [fluticasone-salmeterol], Diclofenac sodium, Diclofenac sodium, Doxycycline, Dulera [mometasone furo-formoterol fum], Penicillins, Sulfonamide derivatives, Tiotropium, and Tiotropium bromide monohydrate    Review of Systems   Review of Systems  Unable to perform ROS: Psychiatric disorder    Physical Exam Updated Vital Signs BP (!) 157/124   Pulse 78   Temp 98.7 F (37.1 C) (Oral)   Resp 16   Ht 5\' 4"  (1.626 m)   Wt 54 kg   SpO2 99%   BMI 20.43 kg/m  Physical Exam Vitals and nursing note reviewed.   82 year old female, resting comfortably and in no acute distress. Vital signs are significant for elevated blood pressure. Oxygen saturation is 99%, which is normal. Head is normocephalic and atraumatic. PERRLA, EOMI.  Neck is nontender and supple. Lungs are clear without rales, wheezes, or rhonchi. Chest is nontender. Heart has regular rate and rhythm without murmur. Abdomen is soft, flat, nontender. Extremities have no cyanosis or edema, full range of motion is present.  Skin is warm and dry without rash. Neurologic: Awake and alert, oriented to person and place.  Speech is spontaneous and appropriate.  Cranial nerves are intact, moves all extremities equally. Psychiatric: Does not appear to be responding to internal stimuli, but does express some paranoid ideation.  ED Results / Procedures / Treatments   Labs (all labs ordered are listed, but only abnormal results are displayed) Labs Reviewed  CBC WITH DIFFERENTIAL/PLATELET - Abnormal; Notable for  the following components:      Result Value   RBC 3.72 (*)    Hemoglobin 10.5 (*)    HCT 34.0 (*)    Eosinophils Absolute 1.0 (*)    All other components within normal limits  COMPREHENSIVE METABOLIC PANEL - Abnormal; Notable for the following components:   Chloride 113 (*)    CO2 18 (*)    Glucose, Bld 106 (*)    BUN 40 (*)    Creatinine, Ser 2.30 (*)    Calcium 11.1 (*)    GFR, Estimated 21 (*)    All other components within normal limits  ACETAMINOPHEN LEVEL - Abnormal; Notable for the following components:   Acetaminophen (Tylenol), Serum <10 (*)    All other components within normal limits  SALICYLATE LEVEL - Abnormal; Notable for the following components:   Salicylate Lvl <7.0 (*)    All other components within normal limits  VALPROIC ACID LEVEL - Abnormal; Notable for the following components:   Valproic Acid Lvl <10 (*)    All other components within normal limits  URINALYSIS, W/ REFLEX TO CULTURE (INFECTION SUSPECTED) - Abnormal; Notable for the following components:   APPearance HAZY (*)    Hgb urine dipstick SMALL (*)    Protein, ur 30 (*)    Leukocytes,Ua TRACE (*)    Bacteria, UA RARE (*)    All other components within normal limits  ETHANOL  RAPID URINE DRUG SCREEN, HOSP PERFORMED   Procedures Procedures    Medications Ordered in ED Medications  OLANZapine zydis (ZYPREXA) disintegrating tablet 5 mg (has no administration in time range)    And  LORazepam (ATIVAN) tablet 1 mg (has no administration in time range)    And  ziprasidone (GEODON) injection 20 mg (has no administration in time range)  amLODipine (NORVASC) tablet 10 mg (has no administration in time range)  budesonide (PULMICORT) nebulizer solution 0.25 mg (has no administration in time range)  divalproex (DEPAKOTE ER) 24 hr tablet 250 mg (has no administration in time range)  hydrALAZINE (APRESOLINE) tablet 25 mg (has no administration in time range)  ipratropium-albuterol (DUONEB) 0.5-2.5 (3)  MG/3ML nebulizer solution 3 mL (has no administration in time range)  mometasone-formoterol (DULERA) 200-5 MCG/ACT inhaler 2 puff (has no administration in time range)  montelukast (SINGULAIR) tablet 10 mg (has no administration in time range)  olopatadine (PATANOL) 0.1 % ophthalmic solution 1 drop (has no administration in time range)  pantoprazole (PROTONIX) EC tablet 40 mg (has no administration in time range)  QUEtiapine (SEROQUEL) tablet 100 mg (has no administration in time range)    ED Course/ Medical Decision Making/ A&P                                 Medical Decision Making Risk Prescription drug management.   Increased agitation in patient with known history of bipolar disorder.  Paranoid ideation.  I need to rule out occult UTI as something that might trip her  over the edge, if not present will need psychiatric evaluation by TTS.  I have reviewed her laboratory tests, and my interpretation is stable renal insufficiency, stable anemia, undetectable acetaminophen and salicylate and ethanol and valproic acid levels, urinalysis without evidence of infection, negative urine drug screen.  I have ordered a dose of hydralazine for her blood pressure.  She is medically cleared for psychiatric evaluation and treatment.  I have ordered TTS consultation.  Final Clinical Impression(s) / ED Diagnoses Final diagnoses:  Agitation  Paranoid ideation (HCC)  Renal insufficiency  Normochromic normocytic anemia    Rx / DC Orders ED Discharge Orders     None        EKG Interpretation Date/Time:  Sunday October 19 2023 01:38:42 EST Ventricular Rate:  75 PR Interval:  134 QRS Duration:  92 QT Interval:  370 QTC Calculation: 413 R Axis:   42  Text Interpretation: Normal sinus rhythm Minimal voltage criteria for LVH, may be normal variant ( [employer] product ) Possible Anterior infarct , age undetermined Abnormal ECG When compared with ECG of 20-Sep-2023 01:04, No significant change  was found Confirmed by Dione Booze ([id number]) on 10/19/2023 1:43:49 AM       I have reviewed her electrocardiogram, and my interpretation is left ventricular hypertrophy but no ST or T changes, unchanged from prior.    Dione Booze, MD 10/19/23 [id number]    Dione Booze, MD 10/19/23 [phone]

## 2023-10-19 NOTE — ED Notes (Signed)
Pt belongings are with daughter.

## 2023-10-19 NOTE — ED Notes (Signed)
Pt bed linens changed, pt given a basin with water, soap and lotion to bathe, pt reports dizziness with ambulation and is more comfortable washing up as opposed to showering, pt room tidied up, pt consumed 75% of meal
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
## 2023-10-19 NOTE — ED Provider Notes (Signed)
Emergency Medicine Observation Re-evaluation Note  [name] [name] is a 82 y.o. female, seen on rounds today.  Pt initially presented to the ED for complaints of Paranoid Currently, the patient is sleeping.  Physical Exam  BP (!) 137/59 (BP Location: Left Arm)   Pulse 61   Temp 98 F (36.7 C) (Oral)   Resp 16   Ht 5\' 4"  (1.626 m)   Wt 54 kg   SpO2 99%   BMI 20.43 kg/m  Physical Exam General: Sleeping Cardiac: Extremities well-perfused Lungs: Breathing is unlabored Psych: Deferred  ED Course / MDM  EKG:EKG Interpretation Date/Time:  Sunday October 19 2023 01:38:42 EST Ventricular Rate:  75 PR Interval:  134 QRS Duration:  92 QT Interval:  370 QTC Calculation: 413 R Axis:   42  Text Interpretation: Normal sinus rhythm Minimal voltage criteria for LVH, may be normal variant ( Cornell product ) Possible Anterior infarct , age undetermined Abnormal ECG When compared with ECG of 20-Sep-2023 01:04, No significant change was found Confirmed by Dione Booze ([id number]) on 10/19/2023 1:43:49 AM  I have reviewed the labs performed to date as well as medications administered while in observation.  Recent changes in the last 24 hours include presentation to the emergency department yesterday for agitation, paranoia, and delusions.  She has been medically cleared.  Psychiatry evaluated early this morning.  Plan for reassessment later today.  Plan  Current plan is for reassessment by TTS.    Gloris Manchester, MD 10/19/23 [phone]

## 2023-10-20 DIAGNOSIS — F22 Delusional disorders: Secondary | ICD-10-CM | POA: Diagnosis not present

## 2023-10-20 DIAGNOSIS — F319 Bipolar disorder, unspecified: Secondary | ICD-10-CM

## 2023-10-20 NOTE — ED Notes (Signed)
Pt becoming more irritable/agitated about having to wait for her daughter. Pt said she was going to wait outside "those doors" talking about the purple zone doors. Explained to pt that she can't wait outside the doors and she has to wait for her daughter to come to the unit.

## 2023-10-20 NOTE — ED Notes (Signed)
Pt's daughter here to pick up pt. Discharge instructions reviewed w/ pt and daughter as well as follow up care. VSS and ambulatory w/ steady gait upon departure. Pt's daughter verbalized understanding of D/C instructions.

## 2023-10-20 NOTE — ED Notes (Signed)
Pt still w/ confusion. Currently confused about saying that someone told her she could wear her gown and scrubs that she is currently wearing outside and get changed outside. Explained to pt that her daughter will be coming at 5:30pm and will be bringing her clothing because we have to keep the gown and scrubs to wash.

## 2023-10-20 NOTE — ED Notes (Signed)
Pt having 1st phone call  ?

## 2023-10-20 NOTE — ED Provider Notes (Signed)
Emergency Medicine Observation Re-evaluation Note  [name] [name] is a 82 y.o. female, seen on rounds today.  Pt initially presented to the ED for complaints of Paranoid Currently, the patient is sleeping.  Physical Exam  BP (!) 142/66 (BP Location: Right Arm)   Pulse 75   Temp 98 F (36.7 C)   Resp 18   Ht 5\' 4"  (1.626 m)   Wt 54 kg   SpO2 99%   BMI 20.43 kg/m  Physical Exam General: Sleeping Cardiac: Extremities well-perfused Lungs: Breathing is unlabored Psych: Deferred  ED Course / MDM  EKG:EKG Interpretation Date/Time:  Sunday October 19 2023 01:38:42 EST Ventricular Rate:  75 PR Interval:  134 QRS Duration:  92 QT Interval:  370 QTC Calculation: 413 R Axis:   42  Text Interpretation: Normal sinus rhythm Minimal voltage criteria for LVH, may be normal variant ( Cornell product ) Possible Anterior infarct , age undetermined Abnormal ECG When compared with ECG of 20-Sep-2023 01:04, No significant change was found Confirmed by Dione Booze ([id number]) on 10/19/2023 1:43:49 AM  I have reviewed the labs performed to date as well as medications administered while in observation.  Recent changes in the last 24 hours include presentation to the emergency department yesterday for agitation, paranoia, and delusions.  She has been medically cleared.  Psych saw yesterday with plans to reassess this morning  Plan  Current plan is for reassessment by TTS.      Melene Plan, DO 10/20/23 [phone]

## 2023-10-20 NOTE — ED Notes (Signed)
Pt becoming more irritable w/ staff. Pt asked for another bouffant and mask. Provided pt w/ a different bouffant, d/t unable to find the same one and provided pt w/ a mask w/ the metal nose piece removed. Pt refused new bouffant and states that when she was admitted she was told we had more of the type she currently has on. Pt then was verbally aggressive w/ this RN stating, "I don't want to talk about it anymore".

## 2023-10-20 NOTE — Progress Notes (Signed)
CSW spoke with patient's daugther [name] to discuss disposition. [name] reports patient needs memory care placement due to her dementia diagnosis and inability to care for herself independently. [name] states her mom would not be to receptive to home health services. [name] agreeable for CSW to send her a list of memory care facilities to explore with patient to determine what facility may work best. [name] reports she will arrive at the hospital around 5:30pm to pick patient up and transport her home.  Edwin Dada, MSW, LCSW Transitions of Care  Clinical Social Worker II [phone]
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
## 2023-10-20 NOTE — ED Notes (Signed)
Coleman NP at bedside. 

## 2023-10-20 NOTE — ED Notes (Signed)
Pt asked what time it is. Told pt what time it is. Pt said that this RN told her yesterday that it was Monday, "so today is Tuesday then". Explained to pt that today is Monday the 9th. Also explained to pt that this RN wasn't here yesterday. Pt started to argue that this RN was here yesterday. RN stated, "Ok" and left the room. Pt denies need of anything at this time.

## 2023-10-22 ENCOUNTER — Other Ambulatory Visit (HOSPITAL_COMMUNITY): Payer: Self-pay

## 2023-10-22 MED ORDER — FASENRA PEN 30 MG/ML ~~LOC~~ SOAJ: SUBCUTANEOUS | 0 refills | Status: DC

## 2023-10-22 NOTE — Telephone Encounter (Signed)
Received notification from [facility] regarding a prior authorization for [facility]. Authorization has been APPROVED from 10/02/23 to 04/13/24. Approval letter sent to scan center.  Per test claim, copay for 28 days supply is $4.60  Patient can fill through [facility]: [phone]   Authorization # [id number] Phone # [phone]  ATC patient regarding [facility] new start. Unable to reach . Left VM. MyChart message sent. Rx sent to [facility] in preparation  Chesley Mires, PharmD, MPH, BCPS, CPP Clinical Pharmacist (Rheumatology and Pulmonology)

## 2023-10-23 ENCOUNTER — Emergency Department (HOSPITAL_COMMUNITY): Payer: Medicare Other

## 2023-10-23 ENCOUNTER — Encounter (HOSPITAL_COMMUNITY): Payer: Self-pay

## 2023-10-23 ENCOUNTER — Other Ambulatory Visit: Payer: Self-pay

## 2023-10-23 ENCOUNTER — Emergency Department (HOSPITAL_COMMUNITY)
Admission: EM | Admit: 2023-10-23 | Discharge: 2023-10-29 | Disposition: A | Discharge status: Other Acute Inpatient Hospital | Payer: Medicare Other | Attending: Emergency Medicine | Admitting: Emergency Medicine

## 2023-10-23 DIAGNOSIS — F319 Bipolar disorder, unspecified: Secondary | ICD-10-CM | POA: Diagnosis not present

## 2023-10-23 DIAGNOSIS — Z79899 Other long term (current) drug therapy: Secondary | ICD-10-CM | POA: Diagnosis not present

## 2023-10-23 DIAGNOSIS — F3189 Other bipolar disorder: Secondary | ICD-10-CM | POA: Insufficient documentation

## 2023-10-23 DIAGNOSIS — F22 Delusional disorders: Secondary | ICD-10-CM

## 2023-10-23 DIAGNOSIS — F312 Bipolar disorder, current episode manic severe with psychotic features: Secondary | ICD-10-CM | POA: Diagnosis not present

## 2023-10-23 DIAGNOSIS — K219 Gastro-esophageal reflux disease without esophagitis: Secondary | ICD-10-CM | POA: Insufficient documentation

## 2023-10-23 DIAGNOSIS — Z91148 Patient's other noncompliance with medication regimen for other reason: Secondary | ICD-10-CM | POA: Insufficient documentation

## 2023-10-23 DIAGNOSIS — R4182 Altered mental status, unspecified: Secondary | ICD-10-CM | POA: Insufficient documentation

## 2023-10-23 DIAGNOSIS — E785 Hyperlipidemia, unspecified: Secondary | ICD-10-CM | POA: Insufficient documentation

## 2023-10-23 DIAGNOSIS — I6523 Occlusion and stenosis of bilateral carotid arteries: Secondary | ICD-10-CM | POA: Diagnosis not present

## 2023-10-23 DIAGNOSIS — I10 Essential (primary) hypertension: Secondary | ICD-10-CM | POA: Insufficient documentation

## 2023-10-23 DIAGNOSIS — R451 Restlessness and agitation: Secondary | ICD-10-CM | POA: Insufficient documentation

## 2023-10-23 DIAGNOSIS — R45851 Suicidal ideations: Secondary | ICD-10-CM | POA: Diagnosis not present

## 2023-10-23 DIAGNOSIS — J4489 Other specified chronic obstructive pulmonary disease: Secondary | ICD-10-CM | POA: Insufficient documentation

## 2023-10-23 DIAGNOSIS — R9082 White matter disease, unspecified: Secondary | ICD-10-CM | POA: Diagnosis not present

## 2023-10-23 DIAGNOSIS — Z1152 Encounter for screening for COVID-19: Secondary | ICD-10-CM | POA: Insufficient documentation

## 2023-10-23 DIAGNOSIS — G4700 Insomnia, unspecified: Secondary | ICD-10-CM | POA: Diagnosis not present

## 2023-10-23 DIAGNOSIS — K118 Other diseases of salivary glands: Secondary | ICD-10-CM | POA: Insufficient documentation

## 2023-10-23 DIAGNOSIS — R443 Hallucinations, unspecified: Secondary | ICD-10-CM | POA: Insufficient documentation

## 2023-10-23 LAB — SALICYLATE LEVEL: Salicylate Lvl: <7 mg/dL — ABNORMAL LOW (ref 7.0–30.0)

## 2023-10-23 LAB — COMPREHENSIVE METABOLIC PANEL
ALT: 14 U/L (ref 0–44)
AST: 23 U/L (ref 15–41)
Albumin: 3.7 g/dL (ref 3.5–5.0)
Alkaline Phosphatase: 63 U/L (ref 38–126)
Anion gap: 14 (ref 5–15)
BUN: 43 mg/dL — ABNORMAL HIGH (ref 8–23)
CO2: 16 mmol/L — ABNORMAL LOW (ref 22–32)
Calcium: 11.7 mg/dL — ABNORMAL HIGH (ref 8.9–10.3)
Chloride: 111 mmol/L (ref 98–111)
Creatinine, Ser: 2.59 mg/dL — ABNORMAL HIGH (ref 0.44–1.00)
GFR, Estimated: 18 mL/min — ABNORMAL LOW (ref >60)
Glucose, Bld: 80 mg/dL (ref 70–99)
Potassium: 4.6 mmol/L (ref 3.5–5.1)
Sodium: 141 mmol/L (ref 135–145)
Total Bilirubin: 1 mg/dL (ref <1.2)
Total Protein: 8 g/dL (ref 6.5–8.1)

## 2023-10-23 LAB — CBC WITH DIFFERENTIAL/PLATELET
Abs Immature Granulocytes: 0.02 10*3/uL (ref 0.00–0.07)
Basophils Absolute: 0.1 10*3/uL (ref 0.0–0.1)
Basophils Relative: 1 %
Eosinophils Absolute: 0.5 10*3/uL (ref 0.0–0.5)
Eosinophils Relative: 7 %
HCT: 34.3 % — ABNORMAL LOW (ref 36.0–46.0)
Hemoglobin: 10.5 g/dL — ABNORMAL LOW (ref 12.0–15.0)
Immature Granulocytes: 0 %
Lymphocytes Relative: 13 %
Lymphs Abs: 0.9 10*3/uL (ref 0.7–4.0)
MCH: 27.9 pg (ref 26.0–34.0)
MCHC: 30.6 g/dL (ref 30.0–36.0)
MCV: 91 fL (ref 80.0–100.0)
Monocytes Absolute: 0.4 10*3/uL (ref 0.1–1.0)
Monocytes Relative: 7 %
Neutro Abs: 4.9 10*3/uL (ref 1.7–7.7)
Neutrophils Relative %: 72 %
Platelets: 240 10*3/uL (ref 150–400)
RBC: 3.77 MIL/uL — ABNORMAL LOW (ref 3.87–5.11)
RDW: 13.4 % (ref 11.5–15.5)
WBC: 6.8 10*3/uL (ref 4.0–10.5)
nRBC: 0 % (ref 0.0–0.2)

## 2023-10-23 LAB — ETHANOL: Alcohol, Ethyl (B): <10 mg/dL (ref <10)

## 2023-10-23 LAB — ACETAMINOPHEN LEVEL: Acetaminophen (Tylenol), Serum: <10 ug/mL — ABNORMAL LOW (ref 10–30)

## 2023-10-23 MED ORDER — HALOPERIDOL LACTATE 5 MG/ML IJ SOLN
2.0000 mg | Freq: Once | INTRAMUSCULAR | Status: AC
  Administered 2023-10-23: 2 mg via INTRAMUSCULAR
  Filled 2023-10-23: qty 1

## 2023-10-23 MED ORDER — LORAZEPAM 2 MG/ML IJ SOLN
2.0000 mg | Freq: Once | INTRAMUSCULAR | Status: AC
Start: 2023-10-23 — End: 2023-10-23
  Administered 2023-10-23: 2 mg via INTRAMUSCULAR
  Filled 2023-10-23: qty 1

## 2023-10-23 MED ORDER — ACETAMINOPHEN 500 MG PO TABS
1000.0000 mg | ORAL_TABLET | Freq: Four times a day (QID) | ORAL | Status: DC | PRN
  Administered 2023-10-26: 1000 mg via ORAL
  Filled 2023-10-23 (×2): qty 2

## 2023-10-23 MED ORDER — DIVALPROEX SODIUM ER 250 MG PO TB24
250.0000 mg | ORAL_TABLET | Freq: Every day | ORAL | Status: DC
  Administered 2023-10-24 – 2023-10-28 (×5): 250 mg via ORAL
  Filled 2023-10-23 (×5): qty 1

## 2023-10-23 MED ORDER — HYDRALAZINE HCL 25 MG PO TABS
25.0000 mg | ORAL_TABLET | Freq: Three times a day (TID) | ORAL | Status: DC
  Administered 2023-10-23 – 2023-10-29 (×14): 25 mg via ORAL
  Filled 2023-10-23 (×15): qty 1

## 2023-10-23 MED ORDER — QUETIAPINE FUMARATE 100 MG PO TABS
100.0000 mg | ORAL_TABLET | Freq: Every day | ORAL | Status: DC
  Administered 2023-10-23 – 2023-10-26 (×4): 100 mg via ORAL
  Filled 2023-10-23 (×4): qty 1

## 2023-10-23 MED ORDER — AMLODIPINE BESYLATE 5 MG PO TABS
10.0000 mg | ORAL_TABLET | Freq: Every day | ORAL | Status: DC
  Administered 2023-10-23 – 2023-10-29 (×5): 10 mg via ORAL
  Filled 2023-10-23 (×6): qty 2

## 2023-10-23 NOTE — ED Notes (Signed)
Pt refused vitals 

## 2023-10-23 NOTE — ED Provider Notes (Signed)
[place] EMERGENCY DEPARTMENT AT [facility] Provider Note   CSN: [id number] Arrival date & time: 10/23/23  1442     History  Chief Complaint  Patient presents with   Manic Behavior   Psychiatric Evaluation    [name] [name] is a 82 y.o. female history of bipolar, here presenting with manic behavior and needing psychiatric evaluation.  Patient was recently here for similar symptoms.  Patient was seen by psychiatry and did not think she was a candidate for psych admission.  Since discharge to home, patient has been agitated.  Patient has not been sleeping.  She has been suicidal.  She also locked up her autistic son and very agitated towards the family.  The family was very afraid of her.  Patient was IVC by her daughter.  Patient is telling me that she had saved me once.  She states that I bought a property nearby and she was able to save the property.  She also states that everyone believes in God and God will save everyone.  The history is provided by the patient.       Home Medications Prior to Admission medications   Medication Sig Start Date End Date Taking? Authorizing Provider  acetaminophen (TYLENOL) 500 MG tablet Take 1,000 mg by mouth every 6 (six) hours as needed for moderate pain (pain score 4-6).    [provider]  amLODipine (NORVASC) 10 MG tablet TAKE 1 TABLET BY MOUTH EVERY DAY 09/26/23   Myrlene Broker, MD  benralizumab [facility] PEN) 30 MG/ML prefilled autoinjector Inject 30mg  into the skin at Week 0. Courier to pulm: [address], [place] [address]. Appt on 10/29/23 10/22/23   Kalman Shan, MD  budesonide (PULMICORT) 0.25 MG/2ML nebulizer solution Add one ample in nebulizer 2 times daily Patient not taking: Reported on 10/19/2023 10/03/23   Kalman Shan, MD  budesonide-formoterol [facility]) 160-4.5 MCG/ACT inhaler Inhale 2 puffs into the lungs 2 (two) times daily.    [provider]  divalproex (DEPAKOTE  ER) 250 MG 24 hr tablet Take 1 tablet (250 mg total) by mouth at bedtime. 09/05/23   Eliseo Gum B, MD  EPINEPHrine 0.3 mg/0.3 mL IJ SOAJ injection INJECT 0.3 MLS INTO THE MUSCLE ONCE FOR 1 DOSE. 10/13/23   Myrlene Broker, MD  hydrALAZINE (APRESOLINE) 25 MG tablet Take 1 tablet (25 mg total) by mouth in the morning and at bedtime. 10/13/23   Myrlene Broker, MD  ipratropium-albuterol (DUONEB) 0.5-2.5 (3) MG/3ML SOLN ADD ONE AMPULE IN NEBULIZER 4 TIMES DAILY Patient not taking: Reported on 10/19/2023 10/03/23   Kalman Shan, MD  mometasone-formoterol (DULERA) 200-5 MCG/ACT AERO Inhale 2 puffs into the lungs 2 (two) times daily. Patient not taking: Reported on 10/19/2023 09/03/23   Kalman Shan, MD  montelukast (SINGULAIR) 10 MG tablet Take 1 tablet (10 mg total) by mouth at bedtime. 03/19/23   Kalman Shan, MD  Olopatadine HCl (PATADAY OP) Place 1 drop into both eyes daily.    [provider]  pantoprazole (PROTONIX) 40 MG tablet TAKE 1 TABLET BY MOUTH EVERY DAY 08/04/23   Myrlene Broker, MD  QUEtiapine (SEROQUEL) 100 MG tablet Take 1 tablet (100 mg total) by mouth at bedtime. 09/19/23   Bobbye Morton, MD      Allergies    Influenza vaccines, Latex, Albuterol, Advair diskus [fluticasone-salmeterol], Diclofenac sodium, Diclofenac sodium, Doxycycline, Dulera [mometasone furo-formoterol fum], Egg-derived products, Penicillins, Pork-derived products, Sulfonamide derivatives, Tiotropium, and Tiotropium bromide monohydrate  Review of Systems   Review of Systems  Psychiatric/Behavioral:  Positive for agitation, confusion, hallucinations and suicidal ideas.   All other systems reviewed and are negative.   Physical Exam Updated Vital Signs Ht 5\' 4"  (1.626 m)   Wt 54 kg   BMI 20.43 kg/m  Physical Exam Vitals and nursing note reviewed.  Constitutional:      Comments: Responding to internal stimuli and agitated  HENT:     Head: Normocephalic.     Nose:  Nose normal.     Mouth/Throat:     Mouth: Mucous membranes are moist.  Eyes:     Extraocular Movements: Extraocular movements intact.     Pupils: Pupils are equal, round, and reactive to light.  Cardiovascular:     Rate and Rhythm: Normal rate and regular rhythm.     Pulses: Normal pulses.     Heart sounds: Normal heart sounds.  Pulmonary:     Effort: Pulmonary effort is normal.     Breath sounds: Normal breath sounds.  Abdominal:     General: Abdomen is flat.     Palpations: Abdomen is soft.  Musculoskeletal:        General: Normal range of motion.     Cervical back: Normal range of motion and neck supple.  Skin:    General: Skin is warm.     Capillary Refill: Capillary refill takes less than 2 seconds.  Neurological:     General: No focal deficit present.     Mental Status: She is oriented to person, place, and time.  Psychiatric:     Comments: Agitated and responding to internal stimuli     ED Results / Procedures / Treatments   Labs (all labs ordered are listed, but only abnormal results are displayed) Labs Reviewed  CBC WITH DIFFERENTIAL/PLATELET  COMPREHENSIVE METABOLIC PANEL  ETHANOL  SALICYLATE LEVEL  ACETAMINOPHEN LEVEL  URINALYSIS, ROUTINE W REFLEX MICROSCOPIC  RAPID URINE DRUG SCREEN, HOSP PERFORMED    EKG None  Radiology No results found.  Procedures Procedures    Medications Ordered in ED Medications  LORazepam (ATIVAN) injection 2 mg (2 mg Intramuscular Given 10/23/23 1531)  haloperidol lactate (HALDOL) injection 2 mg (2 mg Intramuscular Given 10/23/23 1531)    ED Course/ Medical Decision Making/ A&P                                 Medical Decision Making [name] [name] is a 82 y.o. female here presenting with agitation and aggressive behavior.  Patient is agitated with family and threatening to family.  Patient is IVC by daughter and I maintained the IVC.  Will get psych clearance labs.  Will get CT head as well and consult TTS  8:30  PM Patient is sleeping after meds.  I reviewed her labs and creatinine is baseline around 2.5.  CT head is unremarkable.  Patient is medically clear for psych eval  Amount and/or Complexity of Data Reviewed Labs: ordered. Radiology: ordered.  Risk Prescription drug management.    Final Clinical Impression(s) / ED Diagnoses Final diagnoses:  None    Rx / DC Orders ED Discharge Orders     None         Charlynne Pander, MD 10/23/23 2030

## 2023-10-23 NOTE — ED Notes (Signed)
Pt refused CT scan but did allow for blood work. She also allowed this paramedic to assist her into the stretcher and to be covered up. Pt is resting calmly at this time.

## 2023-10-23 NOTE — ED Notes (Signed)
No case number, Called several time to [employer] office

## 2023-10-23 NOTE — ED Triage Notes (Signed)
Pt arrives with [employer] for the c/o being in a manic episode x 2-3 days. Pt believes food is poisoned at home so is throwing it away, pt locked autistic grandson in the bathroom, pt hasn't slept or eaten in the 3 days, family is locking themselves in the room to keep safe. Daughter called to get process started of pt being seen. Pt is speaking out of her head on arrival, not able to answer any questions.

## 2023-10-23 NOTE — Consult Note (Signed)
Patient brought in by way of [employer] under involuntary commitment taken out by way of family, due to concerns for decompensation of the patient's mental health. Upon arrival, patient appreciably in an altered mental state, thus psychiatry was consulted to see the patient upon medical clearance.  Patient remains not yet medically clear at this time, still awaiting UDS, urinalysis, and CT scan. Patient additionally given emergent intramuscular medications upon arrival to reduce severe agitations; currently sleeping in hospital bed upon observation.  Will coordinate with behavioral health service line team to have patient seen at a later time.

## 2023-10-23 NOTE — ED Notes (Signed)
Sitter arrived

## 2023-10-23 NOTE — ED Notes (Signed)
Pt refused vitals upon arrival.  

## 2023-10-23 NOTE — ED Notes (Signed)
Waiting on MD to do first exam  for IVC Pt

## 2023-10-23 NOTE — ED Notes (Signed)
Pt came in IVC , First Exam  Completed  MD,attempted to efile but not successful paperwork has been uploaded to patients chart and copies have been made 3 copies on clipboard one in medical records and original in red folder

## 2023-10-23 NOTE — ED Notes (Signed)
Gave magistrate enveloped.

## 2023-10-23 NOTE — ED Notes (Signed)
Patient came in IVC first exam completed by the doctor and efiled envelope#[id number] 3 copies attached to clipboard in orange zone 1 in medical record and 1 in red folder

## 2023-10-24 DIAGNOSIS — F312 Bipolar disorder, current episode manic severe with psychotic features: Secondary | ICD-10-CM | POA: Diagnosis not present

## 2023-10-24 LAB — URINALYSIS, ROUTINE W REFLEX MICROSCOPIC
Bacteria, UA: NONE SEEN
Bilirubin Urine: NEGATIVE
Glucose, UA: NEGATIVE mg/dL
Hgb urine dipstick: NEGATIVE
Ketones, ur: 20 mg/dL — AB
Leukocytes,Ua: NEGATIVE
Nitrite: NEGATIVE
Protein, ur: 30 mg/dL — AB
Specific Gravity, Urine: 1.012 (ref 1.005–1.030)
pH: 5 (ref 5.0–8.0)

## 2023-10-24 LAB — RAPID URINE DRUG SCREEN, HOSP PERFORMED
Amphetamines: NOT DETECTED
Barbiturates: NOT DETECTED
Benzodiazepines: NOT DETECTED
Cocaine: NOT DETECTED
Opiates: NOT DETECTED
Tetrahydrocannabinol: NOT DETECTED

## 2023-10-24 NOTE — ED Notes (Signed)
Delivered IVC docs to [facility] Zone

## 2023-10-24 NOTE — ED Notes (Signed)
PT is eating her dinner and talking to her sitter calmly.

## 2023-10-24 NOTE — BH Assessment (Signed)
Per Sindy Messing, RN: "she's still pretty sleepy not answering questions without falling asleep. Sarah, IRIS: "please reach out if she wakes up and consult is needed tonight."    Redmond Pulling, MS, [facility], [facility] Triage Specialist [phone]

## 2023-10-24 NOTE — ED Notes (Signed)
IVC File No. [id number]

## 2023-10-24 NOTE — ED Notes (Signed)
IVC'd 10/23/23, exp 10/30/23; Waiting for Day Shift Dagoberto Reef to arrive to obtain File No.
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
## 2023-10-24 NOTE — Progress Notes (Signed)
LCSW Progress Note  [id number]   [name] [name]  10/24/2023  12:45 PM  Description:   Inpatient Psychiatric Referral  Patient was recommended inpatient per Estill Cotta NP. There are no available beds at [facility], per [facility] RN Patient was referred to the following out of network facilities:   Destination  Service Provider Address Phone Fax  [facility] [place] [address] [phone]  [facility] [address] [place] [address] [phone]  [facility] [address] [place] [address] [phone]  [facility] [address] [place], [place] [address] [phone]  [facility] [address] [place] [address] [phone]  [facility] [address], [place] [address] [phone]  [facility] [address] [place] [address] [phone]  [facility] [address] [place] [address] [phone]  [facility] [address], [place] [address] [phone]  [facility] [address], [place] [address] [phone]  [facility] EFAX [address] [place] [phone]  [facility] [address], [place] [address] [phone]  [facility] [address] [place], [place] [address] [phone]  [facility] [address] [place] [address] [phone]  [facility] [address] [place] [address] [phone]      Situation ongoing, CSW to continue following and update chart as more information becomes available.       [name] [name] , MSW, LCSW  10/24/2023 12:45 PM

## 2023-10-25 DIAGNOSIS — F312 Bipolar disorder, current episode manic severe with psychotic features: Secondary | ICD-10-CM | POA: Diagnosis not present

## 2023-10-25 MED ORDER — MOMETASONE FURO-FORMOTEROL FUM 100-5 MCG/ACT IN AERO
2.0000 | INHALATION_SPRAY | Freq: Two times a day (BID) | RESPIRATORY_TRACT | Status: DC
  Administered 2023-10-25 – 2023-10-28 (×6): 2 via RESPIRATORY_TRACT
  Filled 2023-10-25 (×3): qty 8.8

## 2023-10-25 MED ORDER — OLOPATADINE HCL 0.1 % OP SOLN
1.0000 [drp] | Freq: Every day | OPHTHALMIC | Status: DC
  Administered 2023-10-25 – 2023-10-27 (×3): 1 [drp] via OPHTHALMIC
  Filled 2023-10-25 (×2): qty 5

## 2023-10-25 MED ORDER — PANTOPRAZOLE SODIUM 40 MG PO TBEC
40.0000 mg | DELAYED_RELEASE_TABLET | Freq: Every day | ORAL | Status: DC
  Administered 2023-10-25 – 2023-10-29 (×4): 40 mg via ORAL
  Filled 2023-10-25 (×5): qty 1

## 2023-10-25 NOTE — Progress Notes (Signed)
LCSW Progress Note  [id number]   [name] [name]  10/25/2023  6:10 PM    Inpatient Behavioral Health Placement  Pt meets inpatient criteria per Maryagnes Amos, FNP. There are no available beds within [facility] system per [facility] AC Rosey Bath, RN. Referral was sent to the following facilities;   Destination  Service Provider Address Phone Fax  [facility] [place] [address] [phone]  [facility] [address] [place] [address] [phone]  [facility] [address], [place] [address] [phone]  [facility] [address] [place], [place] [address] [phone]  [facility] [address] [place] [address] [phone]  [facility] [address], [place] [address] [phone]  [facility] [address] [place] [address] [phone]  [facility] [address], [place] [address] [phone]  [facility] [address], [place] [address] [phone]  [facility] [address] [place] [address] [phone]  [facility] EFAX [address] [place] [phone]  [facility] [address] [place] [address] [phone]  [facility] [address] [place], [place] [address] [phone]  [facility] [address], [place] [address] [phone]  [facility] [address], [place] [address] [phone]  [facility] [address] [place], [place] [address] [phone]   [facility] [address], [place] [address] [phone]  [facility] Health Patient Placement [facility], [place] [phone]  [facility] [address] [place], [place] [address] [phone]  [facility] [address], [place] [address] [phone]  [facility] [place] [phone]  [facility] [address] [place] [address] [phone]  [facility] [address] [place] [address] [phone]  [facility] Psychiatry Inpatient [place] [phone]  [facility] [address] [place] [address] [phone]  [facility] [address] [place] [address] [phone]  [facility] [address] [place], [place] [address] [phone]  [facility] [address] [place] [address] [phone]  [facility] [address], [place] [address] [phone]  [facility] [address] [place] [address] [phone]    Situation ongoing,  CSW will follow up.    Maryjean Ka, MSW, [facility] 10/25/2023 6:10 PM

## 2023-10-25 NOTE — ED Provider Notes (Signed)
Emergency Medicine Observation Re-evaluation Note  [name] [name] is a 82 y.o. female, seen on rounds today.  Pt initially presented to the ED for complaints of Manic Behavior and Psychiatric Evaluation Currently, the patient is resting in bed.  Physical Exam  BP (!) 165/87 (BP Location: Right Arm)   Pulse 65   Temp 97.7 F (36.5 C) (Oral)   Resp 18   Ht 5\' 4"  (1.626 m)   Wt 54 kg   SpO2 100%   BMI 20.43 kg/m  Physical Exam General: NAD Cardiac: well perfused Lungs: even and unlabored Psych: no agitation  ED Course / MDM  EKG:   I have reviewed the labs performed to date as well as medications administered while in observation.  Recent changes in the last 24 hours include pt seen by psychiatry, deemed to meet inpatient criteria.  Plan  Current plan is for inpatient psychiatric admission.    Ernie Avena, MD 10/25/23 1137

## 2023-10-25 NOTE — Progress Notes (Signed)
LCSW Progress Note  [id number]   [name] [name]  10/25/2023  12:05 AM    Inpatient Behavioral Health Placement  Pt meets inpatient criteria per  Maryagnes Amos, FNP. There are no available beds [facility] system per [facility] AC Brook McNichol,RN. Referral was sent to the following facilities;    Destination  Service Provider Address Phone Fax  [facility] [place] [address] [phone]  [facility] [address] [place] [address] [phone]  [facility] [address] [place] [address] [phone]  [facility] Center-Geriatric [address] [place], [place] [address] [phone]  [facility] [address] [place] [address] [phone]  [facility] [address], [place] [address] [phone]  [facility] [address] [place] [address] [phone]  [facility] [address], [place] [address] [phone]  [facility] [address] [place] [address] [phone]  [facility] [address] [place] [address] [phone]  [facility] EFAX [address], [place] [phone]  [facility] [address], [place] [address] [phone]  [facility] [address] [place], [place] [address] [phone]  [facility] [address] [place] [address] [phone]  [facility] [address], [place] [address] [phone]  [facility] [address] [place], [place] [address] [phone]   [facility] [address], [place] [address] [phone]  [facility] Patient Placement [facility], [place] [phone]  [facility] [address] [place], [place] [address] [phone]  [facility] [address] [place] [address] [phone]  [facility] [place] [phone]  [facility] [address] [place] [address] [phone]  [facility] [address], [place] [address] [phone]  [facility] Psychiatry Inpatient [place] [phone]  [facility] [address] [place] [address] [phone]  [facility] [address] [place] [address] [phone]  [facility] [address] [place], [place] [address] [phone]  [facility] [address] [place] [address] [phone]  [facility] [address], [place] [address] [phone]  [facility] [address] [place] [address] [phone]   Situation ongoing,  CSW will follow up.    Maryjean Ka, MSW, LCSWA 10/25/2023 12:05 AM

## 2023-10-26 DIAGNOSIS — F312 Bipolar disorder, current episode manic severe with psychotic features: Secondary | ICD-10-CM | POA: Diagnosis not present

## 2023-10-26 MED ORDER — BUDESONIDE 0.5 MG/2ML IN SUSP
0.5000 mg | Freq: Two times a day (BID) | RESPIRATORY_TRACT | Status: DC
  Administered 2023-10-26 – 2023-10-27 (×3): 0.5 mg via RESPIRATORY_TRACT
  Filled 2023-10-26 (×9): qty 2

## 2023-10-26 NOTE — ED Notes (Signed)
Called service response d/t pt was given eggs on her tray and is allergic to eggs. A new tray is to be sent.

## 2023-10-26 NOTE — Progress Notes (Signed)
Patient has been denied by [facility] due to no appropriate beds available. Patient meets BH inpatient criteria per Caryn Bee, NP. Patient has been faxed out to the following facilities:    [facility] [place] [facility] [place] [phone]--[facility] - Request [address] [place] [phone]--[facility] [facility] - Request [address] [place] [phone]--XLKGM-WNUUV [facility]-GeriatricPending - Request Sent--[address] [place] [phone]--IRJJO-ACZY [facility] Center[facility]ending - Request Sent--[address] [place] [phone]--HCWCB-JSEGBTD [facility] Center[facility]ending - Request [address] [place] [phone]--KKXFG-HWEX Point RegionalPending - Request Sent--[address], [place] [phone]--POEUM-PNTIR Hill Adult CampusPending - Request Sent--[address] [place] [phone]--YKDXI-PJASN [facility] HealthPending - Request [address] [place] [phone]--JMEQA-STMHDQQ HealthPending - Request [address] [place] [phone]--[facility] EFAXPending - Request Sent--[address], [phone]--[facility] HealthPending - Request [address], [place] [phone]--DJSHF-WYOVZCHY SpringsPending - Request Sent--[address] [place] [phone]--EZMOQ-HUT [facility] HealthPending - Request [facility], [place] [phone]--SWHQP-RFFMBWGYKZ Regional HospitalPending - Request Sent--[address] [place]  [phone]--UQJFH-LKTGYBWL HealthCare Blue RidgePending - Request Sent--[address] [place], [place] [phone]--BULAG-TXMIWOEHOZYY [place] Women's Behavioral Health UnitPending - Request Sent--100 [facility], [place] [phone]--CMKLK-JZPHXT Health-Behavioral Health Patient PlacementPending - Request Sent--[facility], [facility] [phone]--[facility] - Request [address] [place], [place] [phone]--QRFXJ-OITGPQD Plain HospitalPending - Request Sent--[address] [place] [phone]--[facility] BED Management Behavioral HealthPending - Request Sent--[phone]--[facility] HospitalPending - Request Sent--[address] [place] [phone]--YVOPF-YTWKM Medical CenterPending - Request Sent--[address] [facility] Hospitals Psychiatry Inpatient EFAXPending - Request Sent--[phone]--[facility] CenterPending - Request [address] [place] [phone]--VBTYO-MAYO [facility] HospitalPending - Request Sent--[address] [place] Lawman [facility] [phone]--[facility] Center[facility]ending - Request [address] [place], [place] [phone]--HUOHF-GBMSXJ HospitalPending - Request Sent--[address], [place] [phone]--TMYTR-ZNBVA [facility] HealthcarePending - Request [address] [place]acy Duverney [facility] [place] [phone]--UORVI-FBPPHK Behavioral HealthPending - Request Sent--[address] [place] [address] [place], [place] [phone]--CVKFM-MCRFVO [facility] - Request  Sent--[address] [place] [phone]--  Damita Dunnings, MSW, LCSW-A  9:57 AM 10/26/2023

## 2023-10-26 NOTE — ED Notes (Signed)
IVC'd 10/23/23, exp 10/30/23, File No [id number]

## 2023-10-26 NOTE — ED Provider Notes (Signed)
Emergency Medicine Observation Re-evaluation Note  [name] [name] is a 82 y.o. female, seen on rounds today.  Pt initially presented to the ED for complaints of Manic Behavior and Psychiatric Evaluation Currently, the patient is resting in bed.  Physical Exam  BP 138/68 (BP Location: Right Arm)   Pulse 69   Temp 98.2 F (36.8 C) (Oral)   Resp 16   Ht 5\' 4"  (1.626 m)   Wt 54 kg   SpO2 98%   BMI 20.43 kg/m  Physical Exam General: NAD Cardiac: well perfused Lungs: even and unlabored Psych: no agitation  ED Course / MDM  EKG:   I have reviewed the labs performed to date as well as medications administered while in observation.  Recent changes in the last 24 hours include none.  Plan  Current plan is for inpatient psychiatric admission.        Ernie Avena, MD 10/26/23 [phone]

## 2023-10-26 NOTE — Progress Notes (Incomplete)
Assessment/Plan:     [name] [name] is a very pleasant 82 y.o. year old RH female with a history of hypertension, hyperlipidemia, anxiety, depression, ipolar disorder 1 recent presentation with manic behavior and paranoia to the behavioral hospital on 10/19/2023, GERD, bradycardia, vitamin D deficiency, CKD stage II, protein calorie malnutrition COPD, sleep apnea not on CPAP***, seen today for evaluation of memory loss. MoCA today is  /30***.  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  Patient no longer drives.  Dementia *** Recommend good control of cardiovascular risk factors.  Continue secondary stroke prevention. Will consider starting memantine 5 mg twice a day, but in view of the side effects of the medication, we will wait until her status improves Replenish B12 Continue to control mood as per psychiatry, Dr. Morrie Sheldon actual behavioral health currently on quetiapine 100 mg and Depakote 250 mg p.o. nightly. 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 Folllow up in    Subjective:    The patient is accompanied by ***  who supplements the history.    How long did patient have memory difficulties?  For the last***.  Patient has difficulty remembering new information, recent conversations and names of people. repeats oneself?  Endorsed Disoriented when walking into a room?  Denies except occasionally not remembering what patient came to the  room for ***  Leaving objects in unusual places?   Denies.  Wandering behavior? Denies.   Any personality changes, or depression, anxiety?  Has moments of irritability.*** Hallucinations or paranoia?  Endorsed, on 10/24/2023, the patient was brought by family member to the ED after an episode of increased agitation through the night, with paranoid ideations of mass demonstrations outside of the house and they were there to hurt her.  Of note, this was coincidental with her not being compliant to the medications for mood.  They were unavailable awaiting behavioral hospital, so she had to be transferred to a different location.  She denies any suicidal or homicidal ideation.  Of note, her daughter reports that she struggled with bipolar disorder the majority of her life.  He behaviors are worse at night.  She becomes extremely paranoid.  Psychiatry is on board. Seizures? Denies.    Any sleep changes?  Sleeps well***does not sleep well***denies vivid dreams, REM behavior or sleepwalking.   Sleep apnea? Denies.   Any hygiene concerns?  Denies.   Independent of bathing and dressing?  Needs assistance getting dressed.*** Who is in charge of the medications? is in charge *** Who is in charge of the finances?   is in charge   *** Any changes in appetite?  Very poor according to family.***   Patient have trouble swallowing?  Denies.   Does the patient cook?  No***  Any headaches?  Denies.   Chronic back pain?  Denies.   Ambulates with difficulty? Needs a walker to ambulate ***   Recent falls or head injuries? Denies.     Vision changes? Denies. Stroke like symptoms?  Denies.   Any tremors?  Denies.   Any anosmia?  Denies.   Any incontinence of urine? Denies.   Any bowel dysfunction? Denies.      Patient lives her daughter who has a adult son on the autism spectrum, her daughter reports that her mother is very difficult to manage, social work is working on it.***  History of heavy alcohol intake?  Denies.   History of heavy tobacco use? Denies.   Family history of dementia?   ***Denies. Does patient drive?***No longer drives  Labs December 2024 hemoglobin 10.5, hematocrit 34.3.  Normal MCV.  Valproate levels were less than 10 (low).  Other labs October 2024 B12 374, TSH 0.7 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   Latex Anaphylaxis and Swelling   Albuterol Anxiety    Switched to xopenex   Advair Diskus [Fluticasone-Salmeterol]     Tingling in mouth/ears ringing   Diclofenac Sodium     REACTION: Hives   Diclofenac Sodium Swelling   Doxycycline Nausea And Vomiting   Dulera [Mometasone Furo-Formoterol Fum]     HA   Egg-Derived Products    Penicillins Swelling    Tongue swelling   Pork-Derived Products     Pt doesn't eat pork   Sulfonamide Derivatives     Tongue swelling    Tiotropium Itching and Other (See Comments)    dysuria   Tiotropium Bromide Monohydrate     Tongue/mouth itching Dysuria     Current Outpatient Medications  Medication Instructions   acetaminophen (TYLENOL) 1,000 mg, Every 6 hours PRN   amLODipine (NORVASC) 10 mg, Oral, Daily   benralizumab (FASENRA PEN) 30 MG/ML prefilled autoinjector Inject 30mg  into the skin at Week 0. Courier to pulm: [address], [place] [address]. Appt on 10/29/23   budesonide (PULMICORT) 0.25 MG/2ML nebulizer solution Add one ample in nebulizer 2 times daily   budesonide-formoterol (SYMBICORT) 160-4.5 MCG/ACT inhaler 2 puffs, 2 times daily   divalproex (DEPAKOTE ER) 250 mg, Oral, Daily at bedtime   EPINEPHrine 0.3 mg/0.3 mL IJ SOAJ injection INJECT 0.3 MLS INTO THE MUSCLE ONCE FOR 1 DOSE.   hydrALAZINE (APRESOLINE) 25 mg, Oral, 2 times daily   ipratropium-albuterol (DUONEB) 0.5-2.5 (3) MG/3ML SOLN ADD ONE AMPULE IN NEBULIZER 4 TIMES DAILY   mometasone-formoterol (DULERA) 100-5 MCG/ACT AERO 2 puffs, 2 times daily   mometasone-formoterol  (DULERA) 200-5 MCG/ACT AERO 2 puffs, Inhalation, 2 times daily   montelukast (SINGULAIR) 10 mg, Oral, Daily at bedtime   pantoprazole (PROTONIX) 40 mg, Oral, Daily   QUEtiapine (SEROQUEL) 100 mg, Oral, Daily at bedtime     VITALS:  There were no vitals filed for this visit.    PHYSICAL EXAM   HEENT:  Normocephalic, atraumatic. The mucous membranes are moist. The superficial temporal arteries are without ropiness or tenderness. Cardiovascular: Regular rate and rhythm. Lungs: Clear to auscultation bilaterally. Neck: There are no carotid bruits noted bilaterally.  NEUROLOGICAL:     No data to display             12/11/2022    1:20 PM 08/06/2017   10:45 AM 07/18/2016    2:59 PM  MMSE - Mini Mental State Exam  Not completed: Unable to complete    Orientation to time  5 5  Orientation to Place  5 5  Registration  3 3  Attention/ Calculation  5 5  Recall  2 2  Language- name 2 objects  2 2  Language- repeat  1 1  Language- follow 3 step command  3 3  Language- read & follow direction  1 1  Write a sentence  1 1  Copy design  1 1  Total score  29 29     Orientation:  Alert and oriented to person, not to place and time***. No aphasia or dysarthria. Fund of knowledge is reduced. Recent and remote memory impaired.  Attention and concentration are reduced.  Able to name objects and unable to repeat phrases. Delayed recall    Cranial nerves: There is good facial symmetry. Extraocular muscles are intact and visual fields are full to confrontational testing. Speech is fluent and clear, no tongue deviation. Hearing is intact to conversational tone.*** Tone: Tone is good throughout. Sensation: Sensation is intact to light touch and pinprick throughout. Vibration is intact at the bilateral big toe. Coordination: The patient has no difficulty with RAM's or FNF bilaterally. Normal finger to nose  Motor: Strength is 5/5 in the bilateral upper and lower extremities. There is no pronator  drift. There are no fasciculations noted. DTR's: Deep tendon reflexes are 2/4 .  Plantar responses are downgoing bilaterally. Gait and Station: The patient is able to ambulate with difficulty. Needs a walker to ambulate ***. Gait is cautious and narrow.      Thank you for allowing [place] the opportunity to participate in the care of this nice patient. Please do not hesitate to contact us for any questions or concerns.   Total time spent on today's visit was *** minutes dedicated to this patient today, preparing to see patient, examining the patient, ordering tests and/or medications and counseling the patient, documenting clinical information in the EHR or other health record, independently interpreting results and communicating results to the patient/family, discussing treatment and goals, answering patient's questions and coordinating care.  Cc:  Myrlene Broker, MD  Marlowe Kays 10/26/2023 12:08 PM

## 2023-10-27 ENCOUNTER — Ambulatory Visit: Payer: Medicare Other | Admitting: Physician Assistant

## 2023-10-27 ENCOUNTER — Ambulatory Visit: Payer: Medicare Other

## 2023-10-27 DIAGNOSIS — F312 Bipolar disorder, current episode manic severe with psychotic features: Secondary | ICD-10-CM | POA: Diagnosis not present

## 2023-10-27 DIAGNOSIS — F319 Bipolar disorder, unspecified: Secondary | ICD-10-CM

## 2023-10-27 MED ORDER — HALOPERIDOL 5 MG PO TABS
2.5000 mg | ORAL_TABLET | Freq: Every day | ORAL | Status: DC
Start: 2023-10-27 — End: 2023-10-28
  Administered 2023-10-27: 2.5 mg via ORAL
  Filled 2023-10-27: qty 1

## 2023-10-27 MED ORDER — HALOPERIDOL 1 MG PO TABS
2.0000 mg | ORAL_TABLET | Freq: Four times a day (QID) | ORAL | Status: DC | PRN
  Administered 2023-10-27: 2 mg via ORAL
  Filled 2023-10-27 (×2): qty 2

## 2023-10-27 NOTE — ED Notes (Addendum)
Offered haldol again. Pt remains suspect, resistant, cautious and refuses. Given bottled water per earlier request
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
## 2023-10-27 NOTE — ED Notes (Signed)
Rambling disorganized speech, flight of ideas, antagonistic and argumentative toward staff.

## 2023-10-27 NOTE — Progress Notes (Signed)
LCSW Progress Note  [id number]   [name] [name]  10/27/2023  1:30 AM    Inpatient Behavioral Health Placement  Pt meets inpatient criteria per Caryn Bee, NP. There are no available beds within [facility] system per [facility]. Referral was sent to the following facilities;   Destination  Service Provider Address Phone Fax  [facility] [place] [address] [phone]  [facility] [address] [place] [address] [phone]  [facility] [address], [place] [address] [phone]  [facility] [address] [place], [place] [address] [phone]  [facility] [address] [place] [address] [phone]  [facility] [address], [place] [address] [phone]  [facility] [address] [place] [address] [phone]  [facility] [address] [place] [address] [phone]  [facility] [address], [place] [address] [phone]  [facility] [address], [place] [address] [phone]  [facility] EFAX [address] [place] [phone]  [facility] [address] [place] [address] [phone]  [facility] [address] [place], [place] [address] [phone]  [facility] [address] [place] [address] [phone]  [facility] [address], [place] [address] [phone]  [facility] [address] [place], [place] [address] [phone]  [facility] [address] [place] [address] [phone]  [facility] Patient Placement [facility], [place] [phone]  [facility] [address] [place], [place] [address] [phone]  [facility] [address] [place] [address] [phone]  [facility] [place] [phone]  [facility] [address] [place] [address] [phone]  [facility] [address] [place] [address] [phone]  [facility]iat[facility] Inpatient [place] [phone]  [facility] [address] [place] [address] [phone]  [facility] [address] [place] [address] [phone]  [facility] [address] [place], [place] [address] [phone]  [facility] [address], [place] [address] [phone]  [facility] [address], [place] [address] [phone]  [facility] [address], [place] [address] [phone]   [facility] [address], [place] [address] [phone]     Situation ongoing,  CSW will follow up.    Maryjean Ka, MSW, LCSWA 10/27/2023 1:30 AM

## 2023-10-27 NOTE — ED Notes (Signed)
Reluctant/ resistant to take med, remains delusional. Wants to take her meds with bottled water. Ended up taking without water. Given strawberry pushup/ icee per request, "just not mango". Makes many circular no end statements: you know who you are, don't try and pull the wool over my eyes, you're not going to get away with it, your wife out there.... resting in bed.

## 2023-10-27 NOTE — ED Notes (Signed)
Using phone/ attempting phone call

## 2023-10-27 NOTE — ED Notes (Signed)
Ate sausage. Unhappy with the rest of breakfast. Requesting more/ different. Denied with rationale. Snacks offered. Pt declined.
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
## 2023-10-27 NOTE — Progress Notes (Signed)
LCSW Progress Note  [id number]   [name] [name]  10/27/2023  1:38 PM  Description:   Inpatient Psychiatric Referral  Patient was recommended inpatient per Eligha Bridegroom NP  [facility], per [facility] Rona Ravens RN. Patient was referred to the following out of network facilities:    Destination  Service Provider Address Phone Fax  [facility] [place] [address] [phone]  [facility] [address] [place] [address] [phone]  [facility] [address] [place] [address] [phone]  [facility] [address] [place], [place] [address] [phone]  [facility] [address] [place] [address] [phone]  [facility] [address] [place] [address] [phone]  [facility] [address] [place] [address] [phone]  [facility] [address] [place] [phone]  [facility] [address], [place] [address] [phone]  [facility] [address] [place] [address] [phone]  [facility] EFAX [address] [place] [phone]  [facility] [address] [place] [address] [phone]  [facility] [address] [place], [place] [address] [phone]  [facility] [address], [place] [address] [phone]  [facility] [address], [place] [address] [phone]  [facility] [address] [place], [place] [address] [phone]   [facility] [address] [place] [address] [phone]  [facility] Patient Placement [facility], [place] [phone]  [facility] [address] [place], [place] [address] [phone]  [facility] [address] [place] [address] [phone]  [facility] [place] [phone]  [facility] [address] [place] [address] [phone]  [facility] [place] [facility] [address] [place] [address] [phone]  [facility] Inpatient [place] [phone]  [facility] [address] [place] [address] [phone]  [facility] [address] [place] [address] [phone]  [facility] [address] [place], [place] [address] [phone]  [facility] [address], [place] [address] [phone]  [facility] [address], [place] [address] [phone]  [facility] [address], [place] [address] [phone]  [facility] [address], [place] [address] [phone]      Situation ongoing, CSW to continue following and update chart as more information becomes available.      [place] Henry Demeritt MSW, LCSW  10/27/2023 1:38 PM

## 2023-10-27 NOTE — ED Notes (Signed)
Pt refused push-up that she requested, paranoid. Declines/ refuses haldol.

## 2023-10-27 NOTE — ED Provider Notes (Signed)
Emergency Medicine Observation Re-evaluation Note  [name] [name] is a 82 y.o. female, seen on rounds today.  Pt initially presented to the ED for complaints of Manic Behavior and Psychiatric Evaluation Currently, the patient is calm.  Physical Exam  BP (!) 165/66 (BP Location: Right Arm)   Pulse 63   Temp 98.6 F (37 C) (Oral)   Resp 20   Ht 5\' 4"  (1.626 m)   Wt 54 kg   SpO2 100%   BMI 20.43 kg/m  Physical Exam General: nad Lungs: no resp distress Psych: calm  ED Course / MDM  EKG:   I have reviewed the labs performed to date as well as medications administered while in observation.  Recent changes in the last 24 hours include pending placement, social work/TOC working on placement..  No changes otherwise  Plan  Current plan is for placement.    Sloan Leiter, DO 10/27/23 [phone]

## 2023-10-27 NOTE — ED Notes (Signed)
BH/MH NP at [facility]

## 2023-10-28 DIAGNOSIS — F312 Bipolar disorder, current episode manic severe with psychotic features: Secondary | ICD-10-CM | POA: Diagnosis not present

## 2023-10-28 MED ORDER — HALOPERIDOL 5 MG PO TABS
5.0000 mg | ORAL_TABLET | Freq: Every day | ORAL | Status: DC
  Administered 2023-10-28: 5 mg via ORAL
  Filled 2023-10-28: qty 1

## 2023-10-28 NOTE — Consult Note (Cosign Needed Addendum)
Pt seen at [facility] for psychiatric reevaluation. Pt stated she was sleepy, and was not in the mood for chatting much. She continued to state "it was just a long night. A long night" and would not elaborate further. Pt did not want to engage in assessment today.   Pt Seroquel was discontinued yesterday, and pt was started on Haldol 2.5. Per chart review appears patient woke up early around 0500 and was paranoid, yelling at others to wake up, difficult to redirect, and pacing around.   Per chart review appears during the day patient was disorganized and paranoid.   Will increase Haldol to 5 mg. Will continue to recommend inpatient psychiatric treatment.

## 2023-10-28 NOTE — ED Notes (Signed)
This RN and 2 security officers attempted to give pt morning medications.  After much coaching, pt took medications and placed them in her hand and began to walk around room.  Pt held onto medications for sometime but never took them.  Pt would not take medication and security took medications from her hand and returned them to this RN.

## 2023-10-28 NOTE — ED Notes (Signed)
IVC is current

## 2023-10-28 NOTE — ED Notes (Signed)
Patient has been up all night standing in the door way either talking to self or in staff conversations; Pt at points start to yell "wake up wake up!" In attempts to wake other patient's up; pt started using things in the room to bang loudly on her side table to disturb other patient's; RN addressed patient and asked to stop-Monique,RN

## 2023-10-28 NOTE — Progress Notes (Signed)
LCSW Progress Note  [id number]   [name] [name]  10/28/2023  10:28 PM    Inpatient Behavioral Health Placement  Pt meets inpatient criteria per [facility]. There are no available beds within [facility] system per [facility] Rona Ravens, RN. Referral was sent to the following facilities;   Destination  Service Provider Address Phone Fax  [facility] [place] [address] [phone]  [facility] [address] [place] [address] [phone]  [facility] [address], [place] [address] [phone]  [facility] [address] [place], [place] [address] [phone]  [facility] [address] [place] [address] [phone]  [facility] [address], [place] [address] [phone]  [facility] [address] [place] [address] [phone]  [facility] [address] [place] [address] [phone]  [facility] [address] [place] [address] [phone]  [facility] [address], [place] [address] [phone]  [facility] EFAX [address] [place] [phone]  [facility] [address] [place] [address] [phone]  [facility] [address] [place], [place] [address] [phone]  [facility] [address] [place] [address] [phone]  [facility] [address], [place] [address] [phone]  [facility] [address] [place], [place] [address] [phone]   [facility] [address] [place] [address] [phone]  [facility] Patient Placement [facility], [place] [phone]  [facility] [address] [place], [place] [address] [phone]  [facility] [address] [place] [address] [phone]  [facility] [place] [phone]  [facility] [address] [place] [address] [phone]  [facility] [address], [place] [address] [phone]  [facility] [place] [phone]  [facility] [address], [place] [address] [phone]  [facility] [address] [place] [address] [phone]  [facility] [address] [place], [place] [address] [phone]  [facility] [address] [place] [address] [phone]  [facility] [address] [place] [address] [phone]  [facility] [address] [place] [address] [phone]  [facility] [address] [place] [address] [phone]    Situation ongoing,  CSW will follow up.    Maryjean Ka, MSW, [facility] 10/28/2023 10:28 PM

## 2023-10-28 NOTE — ED Notes (Signed)
Pt continues to stand at door talking to other patients and staff.  Pt is increasing anxiety of other patients.  Will continue to redirect.

## 2023-10-28 NOTE — ED Notes (Signed)
Pt noted walking from room toward BR after another pt went into BR and shut the door.  Sitter and this RN attempted to redirect pt, she refused to return to her room.  Security approached pt and was able to return her to her room.

## 2023-10-28 NOTE — ED Notes (Signed)
Pt resting in ER bed, NAD noted, will obtain VS when pt is up and active.

## 2023-10-28 NOTE — Progress Notes (Addendum)
Inpatient Behavioral Health Placement  This CSW spoke with Tiffany, Intake with [facility] about [facility] referral and CSW was informed that pt can be accepted for TOMORROW 10/29/2023. This CSW provided pt's nurse contact information as requested by [facility].  Pt was accepted [facility] TOMORROW  10/29/2023; Bed Assignment [facility]  Pt meets inpatient criteria per Mad[facility] Rear  Attending Physician will be Dr.Rajakumar Robet Leu, MD  Report can be called to: [phone]  Pt can arrive after: 7:00am  Care Team notified:Lillian Vicenta [place],LCSW, [address] [place], LCSWA 10/28/2023 @ 10:41 PM

## 2023-10-28 NOTE — ED Notes (Signed)
Pt. Given ice cream and crackers per request

## 2023-10-28 NOTE — ED Notes (Signed)
Attempted VS and morning medications.  Pt refusing any procedures or treatments from this RN at this time.  Pt standing in doorway talking constantly in a controlled manner.

## 2023-10-28 NOTE — ED Notes (Addendum)
Pt resting on bed, eyes closed, respirations even and non labored.  Will monitor pt.

## 2023-10-28 NOTE — ED Notes (Signed)
Report given to Tiffany at [facility]. Call back #[phone]

## 2023-10-29 DIAGNOSIS — Z79899 Other long term (current) drug therapy: Secondary | ICD-10-CM | POA: Diagnosis not present

## 2023-10-29 DIAGNOSIS — F419 Anxiety disorder, unspecified: Secondary | ICD-10-CM | POA: Diagnosis not present

## 2023-10-29 DIAGNOSIS — R0602 Shortness of breath: Secondary | ICD-10-CM | POA: Diagnosis not present

## 2023-10-29 DIAGNOSIS — E785 Hyperlipidemia, unspecified: Secondary | ICD-10-CM | POA: Diagnosis not present

## 2023-10-29 DIAGNOSIS — Z7951 Long term (current) use of inhaled steroids: Secondary | ICD-10-CM | POA: Diagnosis not present

## 2023-10-29 DIAGNOSIS — F319 Bipolar disorder, unspecified: Secondary | ICD-10-CM | POA: Diagnosis not present

## 2023-10-29 DIAGNOSIS — K219 Gastro-esophageal reflux disease without esophagitis: Secondary | ICD-10-CM | POA: Diagnosis not present

## 2023-10-29 DIAGNOSIS — N189 Chronic kidney disease, unspecified: Secondary | ICD-10-CM | POA: Diagnosis not present

## 2023-10-29 DIAGNOSIS — Z888 Allergy status to other drugs, medicaments and biological substances status: Secondary | ICD-10-CM | POA: Diagnosis not present

## 2023-10-29 DIAGNOSIS — R791 Abnormal coagulation profile: Secondary | ICD-10-CM | POA: Diagnosis not present

## 2023-10-29 DIAGNOSIS — I129 Hypertensive chronic kidney disease with stage 1 through stage 4 chronic kidney disease, or unspecified chronic kidney disease: Secondary | ICD-10-CM | POA: Diagnosis not present

## 2023-10-29 DIAGNOSIS — F312 Bipolar disorder, current episode manic severe with psychotic features: Secondary | ICD-10-CM | POA: Diagnosis not present

## 2023-10-29 DIAGNOSIS — J441 Chronic obstructive pulmonary disease with (acute) exacerbation: Secondary | ICD-10-CM | POA: Diagnosis not present

## 2023-10-29 LAB — RESP PANEL BY RT-PCR (RSV, FLU A&B, COVID)  RVPGX2
Influenza A by PCR: NEGATIVE
Influenza B by PCR: NEGATIVE
Resp Syncytial Virus by PCR: NEGATIVE
SARS Coronavirus 2 by RT PCR: NEGATIVE

## 2023-10-29 NOTE — ED Notes (Signed)
[place], facesheet, IVC paperwork, ed transfer paperwork sent with [facility].

## 2023-10-29 NOTE — ED Notes (Signed)
Transport req at this time by [employer]

## 2023-10-29 NOTE — Discharge Instructions (Addendum)
You will be transported directly from the emergency department to [facility] for further care

## 2023-10-29 NOTE — ED Provider Notes (Signed)
Emergency Medicine Observation Re-evaluation Note  [name] [name] is a 82 y.o. female, seen on rounds today.  Pt initially presented to the ED for complaints of Manic Behavior and Psychiatric Evaluation Currently, the patient is calm and cooperative after breakfast.  Physical Exam  BP (!) 165/66   Pulse 69   Temp 98.6 F (37 C) (Oral)   Resp 18   Ht 5\' 4"  (1.626 m)   Wt 54 kg   SpO2 96%   BMI 20.43 kg/m  Physical Exam General: Awake. Alert. No acute distress Cardiac: Regular rate rhythm Lungs: Clear to auscultation bilaterally Psych: Calm and cooperative  ED Course / MDM  EKG:   I have reviewed the labs performed to date as well as medications administered while in observation.  Recent changes in the last 24 hours include no acute issues.  Plan  Current plan is for continued boarding in the ED awaiting psychiatric placement for inpatient hospitalization at [facility] [place] 10/29/23 [phone]

## 2023-10-30 DIAGNOSIS — J441 Chronic obstructive pulmonary disease with (acute) exacerbation: Secondary | ICD-10-CM | POA: Insufficient documentation

## 2023-10-30 DIAGNOSIS — R791 Abnormal coagulation profile: Secondary | ICD-10-CM | POA: Diagnosis not present

## 2023-10-30 DIAGNOSIS — R0602 Shortness of breath: Secondary | ICD-10-CM | POA: Diagnosis not present

## 2023-10-31 ENCOUNTER — Encounter (HOSPITAL_COMMUNITY): Payer: Medicare Other | Admitting: Student in an Organized Health Care Education/Training Program

## 2023-10-31 DIAGNOSIS — N390 Urinary tract infection, site not specified: Secondary | ICD-10-CM | POA: Diagnosis not present

## 2023-10-31 DIAGNOSIS — I10 Essential (primary) hypertension: Secondary | ICD-10-CM | POA: Diagnosis not present

## 2023-10-31 DIAGNOSIS — F411 Generalized anxiety disorder: Secondary | ICD-10-CM | POA: Diagnosis not present

## 2023-10-31 DIAGNOSIS — K219 Gastro-esophageal reflux disease without esophagitis: Secondary | ICD-10-CM | POA: Diagnosis not present

## 2023-10-31 DIAGNOSIS — R791 Abnormal coagulation profile: Secondary | ICD-10-CM | POA: Diagnosis not present

## 2023-10-31 DIAGNOSIS — Z9151 Personal history of suicidal behavior: Secondary | ICD-10-CM | POA: Diagnosis not present

## 2023-10-31 DIAGNOSIS — F4310 Post-traumatic stress disorder, unspecified: Secondary | ICD-10-CM | POA: Diagnosis not present

## 2023-10-31 DIAGNOSIS — F332 Major depressive disorder, recurrent severe without psychotic features: Secondary | ICD-10-CM | POA: Diagnosis not present

## 2023-10-31 DIAGNOSIS — Z91148 Patient's other noncompliance with medication regimen for other reason: Secondary | ICD-10-CM | POA: Diagnosis not present

## 2023-10-31 DIAGNOSIS — N189 Chronic kidney disease, unspecified: Secondary | ICD-10-CM | POA: Diagnosis not present

## 2023-10-31 DIAGNOSIS — Z7951 Long term (current) use of inhaled steroids: Secondary | ICD-10-CM | POA: Diagnosis not present

## 2023-10-31 DIAGNOSIS — F333 Major depressive disorder, recurrent, severe with psychotic symptoms: Secondary | ICD-10-CM | POA: Diagnosis not present

## 2023-10-31 DIAGNOSIS — J441 Chronic obstructive pulmonary disease with (acute) exacerbation: Secondary | ICD-10-CM | POA: Diagnosis not present

## 2023-10-31 DIAGNOSIS — J4489 Other specified chronic obstructive pulmonary disease: Secondary | ICD-10-CM | POA: Diagnosis not present

## 2023-10-31 DIAGNOSIS — I129 Hypertensive chronic kidney disease with stage 1 through stage 4 chronic kidney disease, or unspecified chronic kidney disease: Secondary | ICD-10-CM | POA: Diagnosis not present

## 2023-11-03 NOTE — Telephone Encounter (Signed)
Patient currently at [facility] facility due to multiple ED visits for paranoia  Chesley Mires, PharmD, MPH, BCPS, CPP Clinical Pharmacist (Rheumatology and Pulmonology)

## 2023-11-11 ENCOUNTER — Encounter: Payer: Self-pay | Admitting: Internal Medicine

## 2023-11-13 NOTE — Telephone Encounter (Signed)
 I have filled out and FL2 form along with medication list printed.

## 2023-11-14 ENCOUNTER — Ambulatory Visit (HOSPITAL_COMMUNITY): Payer: 59 | Admitting: Student in an Organized Health Care Education/Training Program

## 2023-11-14 VITALS — BP 161/87 | HR 58 | Wt 119.0 lb

## 2023-11-14 DIAGNOSIS — F319 Bipolar disorder, unspecified: Secondary | ICD-10-CM

## 2023-11-14 MED ORDER — DIVALPROEX SODIUM ER 250 MG PO TB24: 250.0000 mg | ORAL_TABLET | Freq: Every day | ORAL | 1 refills | Status: DC

## 2023-11-14 MED ORDER — ARIPIPRAZOLE 10 MG PO TABS: 10.0000 mg | ORAL_TABLET | Freq: Every day | ORAL | 1 refills | Status: DC
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
## 2023-11-20 ENCOUNTER — Encounter: Payer: Self-pay | Admitting: Internal Medicine

## 2023-11-20 ENCOUNTER — Ambulatory Visit (INDEPENDENT_AMBULATORY_CARE_PROVIDER_SITE_OTHER): Payer: 59 | Admitting: Internal Medicine

## 2023-11-20 VITALS — BP 160/68 | HR 52 | Temp 98.4°F | Ht 64.0 in | Wt 120.0 lb

## 2023-11-20 DIAGNOSIS — R531 Weakness: Secondary | ICD-10-CM | POA: Diagnosis not present

## 2023-11-20 DIAGNOSIS — R413 Other amnesia: Secondary | ICD-10-CM | POA: Diagnosis not present

## 2023-11-20 MED ORDER — BUDESONIDE-FORMOTEROL FUMARATE 160-4.5 MCG/ACT IN AERO: 2.0000 | INHALATION_SPRAY | Freq: Two times a day (BID) | RESPIRATORY_TRACT | 3 refills | Status: AC

## 2023-11-20 NOTE — Assessment & Plan Note (Signed)
 Overall stable. I think it is unclear if this is related to mental health or physical problem. They did not want to discuss in detail today but have upcoming visit with neurology. Reviewed CT with advanced for age changes so likely there is some MCI or early AD that could be present. Psych requested this referral which I cannot review for privacy settings.

## 2023-11-20 NOTE — Patient Instructions (Signed)
 We will work on the symbicort and let us know if you need anything.

## 2023-11-20 NOTE — Progress Notes (Signed)
   Subjective:   Patient ID: [name] [name], female    DOB: 04-Mar-1941, 83 y.o.   MRN: [id number]  HPI The patient is an 83 YO female coming in for ER follow up (in ER multiple times recently with COPD/asthma flare, paranoia and agitation). Family present with her and they have been trying to get her into a memory care unit. They just found out that a bed is open at [facility] where she is planning to go. Overall she is stable. No new concerns. Sleeping fine. Eating good. No falls.   Review of Systems  Constitutional: Negative.   HENT: Negative.    Eyes: Negative.   Respiratory:  Positive for shortness of breath. Negative for cough and chest tightness.   Cardiovascular:  Negative for chest pain, palpitations and leg swelling.  Gastrointestinal:  Negative for abdominal distention, abdominal pain, constipation, diarrhea, nausea and vomiting.  Musculoskeletal: Negative.   Skin: Negative.   Neurological: Negative.   Psychiatric/Behavioral: Negative.      Objective:  Physical Exam Constitutional:      Appearance: She is well-developed.  HENT:     Head: Normocephalic and atraumatic.  Cardiovascular:     Rate and Rhythm: Normal rate and regular rhythm.  Pulmonary:     Effort: Pulmonary effort is normal. No respiratory distress.     Breath sounds: No wheezing or rales.     Comments: Lung exam stable Abdominal:     General: Bowel sounds are normal. There is no distension.     Palpations: Abdomen is soft.     Tenderness: There is no abdominal tenderness. There is no rebound.  Musculoskeletal:     Cervical back: Normal range of motion.  Skin:    General: Skin is warm and dry.  Neurological:     Mental Status: She is alert and oriented to person, place, and time.     Coordination: Coordination normal.     Vitals:   11/20/23 0936 11/20/23 0947  BP: (!) 160/68 (!) 160/68  Pulse: (!) 52   Temp: 98.4 F (36.9 C)   TempSrc: Oral   SpO2: 99%   Weight: 120 lb (54.4 kg)   Height:  5' 4 (1.626 m)     Assessment & Plan:  Visit time 20 minutes in face to face communication with patient and coordination of care, additional 10 minutes spent in record review, coordination or care, ordering tests, communicating/referring to other healthcare professionals, documenting in medical records all on the same day of the visit for total time 30 minutes spent on the visit.

## 2023-11-20 NOTE — Assessment & Plan Note (Signed)
 Doing a little better. Able to ambulate in home but not that active. Appetite is improved and she is up close to 10 pounds. Sleeping and eating well.

## 2023-12-02 ENCOUNTER — Encounter: Payer: Self-pay | Admitting: Internal Medicine
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
## 2023-12-02 NOTE — Telephone Encounter (Signed)
Copied from CRM [id number]. Topic: General - Other >> Dec 02, 2023 10:44 AM Almira Coaster wrote: Reason for CRM: Patient's daughter is calling to follow up on a mychart message she sent today. Per daughter this needs to be addressed today. The facility [facility] can accept her this week. The requirements are a new FL2 form be sent within 24 hours of admission. The one from January 2nd is acceptable with today's date. The nebulizer medications can be removed because she

## 2023-12-03 NOTE — Telephone Encounter (Addendum)
Forms completed and left voice mail for pt to pick up forms at the front

## 2023-12-05 ENCOUNTER — Ambulatory Visit: Payer: Medicare Other | Admitting: Adult Health

## 2023-12-08 ENCOUNTER — Ambulatory Visit (INDEPENDENT_AMBULATORY_CARE_PROVIDER_SITE_OTHER): Payer: 59 | Admitting: Physician Assistant

## 2023-12-08 ENCOUNTER — Ambulatory Visit: Payer: 59

## 2023-12-08 ENCOUNTER — Encounter: Payer: Self-pay | Admitting: Physician Assistant

## 2023-12-08 VITALS — BP 160/78 | HR 83 | Resp 18 | Wt 116.0 lb

## 2023-12-08 DIAGNOSIS — R413 Other amnesia: Secondary | ICD-10-CM | POA: Diagnosis not present

## 2023-12-08 NOTE — Patient Instructions (Signed)
It was a pleasure to see you today at our office.   Recommendations:  Neurocognitive evaluation at our office  Follow up in 3 months Will revisit starting medication for memory  Recommend visiting the website : " Dementia Success Path" to better understand some behaviors related to memory loss.    For psychiatric meds, mood meds: Please have your primary care physician manage these medications.  If you have any severe symptoms of a stroke, or other severe issues such as confusion,severe chills or fever, etc call 911 or go to the ER as you may need to be evaluated further   For guidance regarding [facility] and if placement were needed at the facility, contact Social Worker tel: [phone]   For assessment of decision of mental capacity and competency:  Call Dr. Erick Blinks, geriatric psychiatrist at [phone]  Counseling regarding caregiver distress, including caregiver depression, anxiety and issues regarding community resources, adult day care programs, adult living facilities, or memory care questions:  please contact your  Primary Doctor's Social Worker   Whom to call: Memory  decline, memory medications: Call our office [phone]    [email or web address]   RECOMMENDATIONS FOR ALL PATIENTS WITH MEMORY PROBLEMS: 1. Continue to exercise (Recommend 30 minutes of walking everyday, or 3 hours every week) 2. Increase social interactions - continue going to [place] and enjoy social gatherings with friends and family 3. Eat healthy, avoid fried foods and eat more fruits and vegetables 4. Maintain adequate blood pressure, blood sugar, and blood cholesterol level. Reducing the risk of stroke and cardiovascular disease also helps promoting better memory. 5. Avoid stressful situations. Live a simple life and avoid aggravations. Organize your time and prepare for the next day in anticipation. 6. Sleep well,  avoid any interruptions of sleep and avoid any distractions in the bedroom that may interfere with adequate sleep quality 7. Avoid sugar, avoid sweets as there is a strong link between excessive sugar intake, diabetes, and cognitive impairment We discussed the Mediterranean diet, which has been shown to help patients reduce the risk of progressive memory disorders and reduces cardiovascular risk. This includes eating fish, eat fruits and green leafy vegetables, nuts like almonds and hazelnuts, walnuts, and also use olive oil. Avoid fast foods and fried foods as much as possible. Avoid sweets and sugar as sugar use has been linked to worsening of memory function.  There is always a concern of gradual progression of memory problems. If this is the case, then we may need to adjust level of care according to patient needs. Support, both to the patient and caregiver, should then be put into place.      You have been referred for a neuropsychological evaluation (i.e., evaluation of memory and thinking abilities). Please bring someone with you to this appointment if possible, as it is helpful for the doctor to hear from both you and another adult who knows you well. Please bring eyeglasses and hearing aids if you wear them.    The evaluation will take approximately 3 hours and has two parts:   The first part is a clinical interview with the neuropsychologist (Dr. Milbert Coulter or Dr. Roseanne Reno). During the interview, the neuropsychologist will speak with you and the individual you brought to the appointment.    The second part of the evaluation is testing with the doctor's technician Annabelle Harman or Selena Batten). During the testing, the technician will ask you to remember different types of material, solve problems, and answer some questionnaires. Your family member  will not be present for this portion of the evaluation.   Please note: We must reserve several hours of the neuropsychologist's time and the psychometrician's time for  your evaluation appointment. As such, there is a No-Show fee of $100. If you are unable to attend any of your appointments, please contact our office as soon as possible to reschedule.      DRIVING: Regarding driving, in patients with progressive memory problems, driving will be impaired. We advise to have someone else do the driving if trouble finding directions or if minor accidents are reported. Independent driving assessment is available to determine safety of driving.   If you are interested in the driving assessment, you can contact the following:  [facility] [employer] [facility] in [place] [phone]  [facility] [phone]  [facility] [phone]  [facility] [phone] or [phone]   FALL PRECAUTIONS: Be cautious when walking. Scan the area for obstacles that may increase the risk of trips and falls. When getting up in the mornings, sit up at the edge of the bed for a few minutes before getting out of bed. Consider elevating the bed at the head end to avoid drop of blood pressure when getting up. Walk always in a well-lit room (use night lights in the walls). Avoid area rugs or power cords from appliances in the middle of the walkways. Use a walker or a cane if necessary and consider physical therapy for balance exercise. Get your eyesight checked regularly.  FINANCIAL OVERSIGHT: Supervision, especially oversight when making financial decisions or transactions is also recommended.  HOME SAFETY: Consider the safety of the kitchen when operating appliances like stoves, microwave oven, and blender. Consider having supervision and share cooking responsibilities until no longer able to participate in those. Accidents with firearms and other hazards in the house should be identified and addressed as well.   ABILITY TO BE LEFT ALONE: If patient is unable to contact 911 operator, consider using LifeLine, or when the need is there, arrange for  someone to stay with patients. Smoking is a fire hazard, consider supervision or cessation. Risk of wandering should be assessed by caregiver and if detected at any point, supervision and safe proof recommendations should be instituted.  MEDICATION SUPERVISION: Inability to self-administer medication needs to be constantly addressed. Implement a mechanism to ensure safe administration of the medications.      Mediterranean Diet A Mediterranean diet refers to food and lifestyle choices that are based on the traditions of countries located on the [employer] [place]. This way of eating has been shown to help prevent certain conditions and improve outcomes for people who have chronic diseases, like kidney disease and heart disease. What are tips for following this plan? Lifestyle  Cook and eat meals together with your family, when possible. Drink enough fluid to keep your urine clear or pale yellow. Be physically active every day. This includes: Aerobic exercise like running or swimming. Leisure activities like gardening, walking, or housework. Get 7-8 hours of sleep each night. If recommended by your health care provider, drink red wine in moderation. This means 1 glass a day for nonpregnant women and 2 glasses a day for men. A glass of wine equals 5 oz (150 mL). Reading food labels  Check the serving size of packaged foods. For foods such as rice and pasta, the serving size refers to the amount of cooked product, not dry. Check the total fat in packaged foods. Avoid foods that have saturated fat or trans fats.  Check the ingredients list for added sugars, such as corn syrup. Shopping  At the grocery store, buy most of your food from the areas near the walls of the store. This includes: Fresh fruits and vegetables (produce). Grains, beans, nuts, and seeds. Some of these may be available in unpackaged forms or large amounts (in bulk). Fresh seafood. Poultry and eggs. Low-fat dairy  products. Buy whole ingredients instead of prepackaged foods. Buy fresh fruits and vegetables in-season from local farmers markets. Buy frozen fruits and vegetables in resealable bags. If you do not have access to quality fresh seafood, buy precooked frozen shrimp or canned fish, such as tuna, salmon, or sardines. Buy small amounts of raw or cooked vegetables, salads, or olives from the deli or salad bar at your store. Stock your pantry so you always have certain foods on hand, such as olive oil, canned tuna, canned tomatoes, rice, pasta, and beans. Cooking  Cook foods with extra-virgin olive oil instead of using butter or other vegetable oils. Have meat as a side dish, and have vegetables or grains as your main dish. This means having meat in small portions or adding small amounts of meat to foods like pasta or stew. Use beans or vegetables instead of meat in common dishes like chili or lasagna. Experiment with different cooking methods. Try roasting or broiling vegetables instead of steaming or sauteing them. Add frozen vegetables to soups, stews, pasta, or rice. Add nuts or seeds for added healthy fat at each meal. You can add these to yogurt, salads, or vegetable dishes. Marinate fish or vegetables using olive oil, lemon juice, garlic, and fresh herbs. Meal planning  Plan to eat 1 vegetarian meal one day each week. Try to work up to 2 vegetarian meals, if possible. Eat seafood 2 or more times a week. Have healthy snacks readily available, such as: Vegetable sticks with hummus. Greek yogurt. Fruit and nut trail mix. Eat balanced meals throughout the week. This includes: Fruit: 2-3 servings a day Vegetables: 4-5 servings a day Low-fat dairy: 2 servings a day Fish, poultry, or lean meat: 1 serving a day Beans and legumes: 2 or more servings a week Nuts and seeds: 1-2 servings a day Whole grains: 6-8 servings a day Extra-virgin olive oil: 3-4 servings a day Limit red meat and sweets  to only a few servings a month What are my food choices? Mediterranean diet Recommended Grains: Whole-grain pasta. Brown rice. Bulgar wheat. Polenta. Couscous. Whole-wheat bread. Orpah Cobb. Vegetables: Artichokes. Beets. Broccoli. Cabbage. Carrots. Eggplant. Green beans. Chard. Kale. Spinach. Onions. Leeks. Peas. Squash. Tomatoes. Peppers. Radishes. Fruits: Apples. Apricots. Avocado. Berries. Bananas. Cherries. Dates. Figs. Grapes. Lemons. Melon. Oranges. Peaches. Plums. Pomegranate. Meats and other protein foods: Beans. Almonds. Sunflower seeds. Pine nuts. Peanuts. Cod. Salmon. Scallops. Shrimp. Tuna. Tilapia. Clams. Oysters. Eggs. Dairy: Low-fat milk. Cheese. Greek yogurt. Beverages: Water. Red wine. Herbal tea. Fats and oils: Extra virgin olive oil. Avocado oil. Grape seed oil. Sweets and desserts: [place] yogurt with honey. Baked apples. Poached pears. Trail mix. Seasoning and other foods: Basil. Cilantro. Coriander. Cumin. Mint. Parsley. Sage. Rosemary. Tarragon. Garlic. Oregano. Thyme. Pepper. Balsalmic vinegar. Tahini. Hummus. Tomato sauce. Olives. Mushrooms. Limit these Grains: Prepackaged pasta or rice dishes. Prepackaged cereal with added sugar. Vegetables: Deep fried potatoes (french fries). Fruits: Fruit canned in syrup. Meats and other protein foods: Beef. Pork. Lamb. Poultry with skin. Hot dogs. Tomasa Blase. Dairy: Ice cream. Sour cream. Whole milk. Beverages: Juice. Sugar-sweetened soft drinks. Beer. Liquor and spirits. Fats and oils:  Butter. Canola oil. Vegetable oil. Beef fat (tallow). Lard. Sweets and desserts: Cookies. Cakes. Pies. Candy. Seasoning and other foods: Mayonnaise. Premade sauces and marinades. The items listed may not be a complete list. Talk with your dietitian about what dietary choices are right for you. Summary The Mediterranean diet includes both food and lifestyle choices. Eat a variety of fresh fruits and vegetables, beans, nuts, seeds, and whole  grains. Limit the amount of red meat and sweets that you eat. Talk with your health care provider about whether it is safe for you to drink red wine in moderation. This means 1 glass a day for nonpregnant women and 2 glasses a day for men. A glass of wine equals 5 oz (150 mL). This information is not intended to replace advice given to you by your health care provider. Make sure you discuss any questions you have with your health care provider. Document Released: 06/20/2016 Document Revised: 07/23/2016 Document Reviewed: 06/20/2016 Elsevier Interactive Patient Education  2017 [employer].

## 2023-12-09 ENCOUNTER — Other Ambulatory Visit (HOSPITAL_COMMUNITY): Payer: Self-pay | Admitting: Student in an Organized Health Care Education/Training Program

## 2023-12-09 ENCOUNTER — Telehealth (HOSPITAL_COMMUNITY): Payer: Self-pay

## 2023-12-09 DIAGNOSIS — F319 Bipolar disorder, unspecified: Secondary | ICD-10-CM

## 2023-12-09 MED ORDER — ARIPIPRAZOLE 10 MG PO TABS: 10.0000 mg | ORAL_TABLET | Freq: Every day | ORAL | 0 refills | Status: DC

## 2023-12-09 NOTE — Telephone Encounter (Signed)
Medication refill - Fax from patient's [facility] on [address] requesting a 90 day order for patient's prescribed Aripiprazole 10 mg tablets, last e-scribed 11/14/23 for 30 days supply plus one refill. Patient returns next on 12/19/23.

## 2023-12-09 NOTE — Telephone Encounter (Signed)
Ok, I sent it. Thanks!

## 2023-12-09 NOTE — Progress Notes (Signed)
90 day supply of Abilify 10mg  sent, at insurance and pharmacy request.  PGY-4  Eliseo Gum, MD

## 2023-12-12 ENCOUNTER — Encounter: Payer: Self-pay | Admitting: Internal Medicine

## 2023-12-12 ENCOUNTER — Encounter: Payer: Self-pay | Admitting: Physician Assistant

## 2023-12-12 NOTE — Telephone Encounter (Signed)
Thank you for the information but I right what is provided to me at the moment of the visit.  I will be no amendments to the note thank you

## 2023-12-19 ENCOUNTER — Ambulatory Visit (INDEPENDENT_AMBULATORY_CARE_PROVIDER_SITE_OTHER): Payer: 59 | Admitting: Student in an Organized Health Care Education/Training Program

## 2023-12-19 VITALS — BP 158/80 | HR 69 | Wt 114.0 lb

## 2023-12-19 DIAGNOSIS — F319 Bipolar disorder, unspecified: Secondary | ICD-10-CM

## 2023-12-19 DIAGNOSIS — R4189 Other symptoms and signs involving cognitive functions and awareness: Secondary | ICD-10-CM
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
## 2023-12-19 NOTE — Patient Instructions (Signed)
 Dr. Laverne Potter, geriatric psychiatrist at [phone]

## 2023-12-22 ENCOUNTER — Encounter: Payer: Medicare Other | Admitting: Internal Medicine

## 2023-12-22 ENCOUNTER — Encounter: Payer: Self-pay | Admitting: Internal Medicine

## 2023-12-22 NOTE — Patient Instructions (Signed)
 ICD-10-CM   1. Severe persistent asthma without complication  J45.50     2. Eosinophilia, unspecified type  D72.10     3. History of anemia  Z86.2     4. AKI (acute kidney injury) (HCC)  N17.9        = Recent hospitalization for urologic issues causing kidney injury and also worsening anemia - Currently asthma is not in flareup but ongoing severe current symptoms -Recently blood eosinophils have reached extremely high levels and contributing to airway discomfort  -CT scan of the chest relatively benign other than chronic findings -Symbicort denied by insurance   Plan -Check CBC with differential and c chemistry blood work  -Monitor anemia and kidney function and blood eosinophils - continue  singulair scheduled  -Continue  albuterol as needed -Do DuoNeb 4 times daily & -Do pulmicort nebulizer 2 times daily  -CMA to send in prescription -Start paperwork for [facility]  -Other issues keep up with the urologic appointment       Follow-up - 8 weeks to see nurse practitioner face-to-face -16 weeks to see Dr. Marchelle Gearing

## 2023-12-22 NOTE — Progress Notes (Signed)
 This encounter was created in error - please disregard.

## 2023-12-24 ENCOUNTER — Other Ambulatory Visit: Payer: Self-pay | Admitting: Child and Adolescent Psychiatry

## 2023-12-24 ENCOUNTER — Other Ambulatory Visit: Payer: Self-pay | Admitting: Internal Medicine

## 2023-12-24 DIAGNOSIS — F319 Bipolar disorder, unspecified: Secondary | ICD-10-CM

## 2023-12-24 MED ORDER — ARIPIPRAZOLE 15 MG PO TABS: 15.0000 mg | ORAL_TABLET | Freq: Every day | ORAL | 1 refills | Status: DC

## 2023-12-24 MED ORDER — DIVALPROEX SODIUM ER 250 MG PO TB24: 250.0000 mg | ORAL_TABLET | Freq: Every day | ORAL | 1 refills | Status: DC

## 2023-12-26 ENCOUNTER — Encounter: Payer: Self-pay | Admitting: Internal Medicine

## 2024-01-02 ENCOUNTER — Telehealth: Payer: Self-pay | Admitting: Internal Medicine

## 2024-01-02 NOTE — Telephone Encounter (Signed)
 Copied from CRM [id number]. Topic: General - Other >> Jan 02, 2024  2:49 PM Herbert Seta B wrote: Reason for CRM: Patient daughter [name] calling, needs updated medication list to pick up asap. [phone]

## 2024-01-08 ENCOUNTER — Ambulatory Visit: Payer: 59

## 2024-01-08 ENCOUNTER — Telehealth: Payer: Self-pay

## 2024-01-08 VITALS — BP 148/83 | HR 67 | Ht 64.0 in | Wt 114.0 lb

## 2024-01-08 DIAGNOSIS — Z0000 Encounter for general adult medical examination without abnormal findings: Secondary | ICD-10-CM

## 2024-01-08 NOTE — Patient Instructions (Signed)
 [name] [name] , Thank you for taking time to come for your Medicare Wellness Visit. I appreciate your ongoing commitment to your health goals. Please review the following plan we discussed and let me know if I can assist you in the future.   Referrals/Orders/Follow-Ups/Clinician Recommendations: It was nice talking to you today.  You are due for a tetanus and a Shingles vaccine.  I sent a message to your PCP for a refill of the Duoneb.  Each day, aim for 6 glasses of water, plenty of protein in your diet and try to get up and walk/ stretch every hour for 5-10 minutes at a time.    This is a list of the screening recommended for you and due dates:  Health Maintenance  Topic Date Due   Zoster (Shingles) Vaccine (1 of 2) Never done   DTaP/Tdap/Td vaccine (2 - Tdap) 01/10/2015   Medicare Annual Wellness Visit  12/12/2023   Pneumonia Vaccine  Completed   DEXA scan (bone density measurement)  Completed   HPV Vaccine  Aged Out   COVID-19 Vaccine  Discontinued   Hepatitis C Screening  Discontinued    Advanced directives: (Copy Requested) Please bring a copy of your health care power of attorney and living will to the office to be added to your chart at your convenience.  Next Medicare Annual Wellness Visit scheduled for next year: Yes

## 2024-01-08 NOTE — Telephone Encounter (Signed)
 Please have her triaged for symptoms so she can get appropriate care. Thanks
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
## 2024-01-08 NOTE — Telephone Encounter (Signed)
 She is requesting a refill for ipratropium-albuterol (DUONEB) 0.5-2.5 (3) MG/3ML SOLN.   Patient stated that she has been having a hard time breathing and that she can not sleep due to breathing.  She is not using a CPAP.

## 2024-01-15 ENCOUNTER — Telehealth: Payer: Self-pay | Admitting: Internal Medicine

## 2024-01-15 NOTE — Telephone Encounter (Signed)
 Pt needs her new fl2 form completed. New form is in the providers mailbox.  Please advice,  Thanks

## 2024-01-16 ENCOUNTER — Encounter (HOSPITAL_COMMUNITY): Payer: 59 | Admitting: Student in an Organized Health Care Education/Training Program

## 2024-01-16 NOTE — Telephone Encounter (Signed)
 Copied from CRM [id number]. Topic: General - Other >> Jan 16, 2024 10:04 AM Isabell A wrote: Reason for CRM: Patient's daughter [name] would like to confirm if patients FL2 form has been signed and also would like to schedule a TB shot appointment.

## 2024-01-16 NOTE — Telephone Encounter (Signed)
 Just need a signature placed inside office box

## 2024-01-17 ENCOUNTER — Other Ambulatory Visit: Payer: Self-pay | Admitting: Child and Adolescent Psychiatry

## 2024-01-18 NOTE — Telephone Encounter (Signed)
 Dr. Morrie Sheldon, please send the prescription for this patient.

## 2024-01-19 ENCOUNTER — Encounter: Payer: Self-pay | Admitting: *Deleted

## 2024-01-19 NOTE — Telephone Encounter (Signed)
 If she needs Tb screening okay to set up for patient. I do not know if her FL2 location will need this but most require some form of TB screening.

## 2024-01-19 NOTE — Telephone Encounter (Signed)
 Please advise if this is ok.

## 2024-01-21 ENCOUNTER — Ambulatory Visit (INDEPENDENT_AMBULATORY_CARE_PROVIDER_SITE_OTHER)

## 2024-01-21 ENCOUNTER — Other Ambulatory Visit (HOSPITAL_COMMUNITY): Payer: Self-pay | Admitting: Student in an Organized Health Care Education/Training Program

## 2024-01-21 DIAGNOSIS — Z111 Encounter for screening for respiratory tuberculosis: Secondary | ICD-10-CM | POA: Diagnosis not present

## 2024-01-21 MED ORDER — ARIPIPRAZOLE 15 MG PO TABS: 15.0000 mg | ORAL_TABLET | Freq: Every day | ORAL | 1 refills | Status: DC

## 2024-01-21 NOTE — Progress Notes (Addendum)
 Patient visits today for their PPD placement. Patient informed of what they had received and tolerated injection well. Patient will be visiting [place] again within 48-72 hours for their reading.

## 2024-01-22 ENCOUNTER — Ambulatory Visit

## 2024-01-23 ENCOUNTER — Ambulatory Visit

## 2024-01-23 LAB — TB SKIN TEST
Induration: 0 mm
TB Skin Test: NEGATIVE

## 2024-01-23 NOTE — Progress Notes (Signed)
 Patient visits today for the reading of their PPD placement. Patient showed negative for TB.

## 2024-01-27 ENCOUNTER — Telehealth: Payer: Self-pay | Admitting: Internal Medicine

## 2024-01-27 NOTE — Telephone Encounter (Unsigned)
 Copied from CRM [id number]. Topic: General - Other >> Jan 27, 2024 11:09 AM Elizebeth Brooking wrote: Reason for CRM: Crystal from [facility] called in regarding patient would like for Dr.Crawfords nurse to give her a call back at [phone]

## 2024-01-28 NOTE — Telephone Encounter (Signed)
>>   Jan 28, 2024 11:07 AM Truddie Crumble wrote: Emelia Salisbury from [facility] called stating they can not read FL2 form that was sent in yesterday and they need another one sent because this is holding up the patient move in CB [phone]

## 2024-01-28 NOTE — Telephone Encounter (Signed)
 Copied from CRM [id number]. Topic: General - Other >> Jan 28, 2024  3:29 PM Theodis Sato wrote: Reason for CRM: Please re fax FL2 forms to [facility] assisted living facility as the form that was faxed earlier today was too blurry and could not be read. Please fax the form to [phone]

## 2024-01-28 NOTE — Telephone Encounter (Signed)
 Resent

## 2024-01-29 NOTE — Telephone Encounter (Signed)
 Copied from CRM [id number]. Topic: General - Other >> Jan 29, 2024 11:16 AM Fredrich Romans wrote: Reason for CRM: [facility] is requesting a phone call from CMA CB [phone]

## 2024-01-29 NOTE — Telephone Encounter (Signed)
 I have fax over the forms again

## 2024-01-31 ENCOUNTER — Other Ambulatory Visit: Payer: Self-pay | Admitting: Child and Adolescent Psychiatry

## 2024-01-31 DIAGNOSIS — F319 Bipolar disorder, unspecified: Secondary | ICD-10-CM

## 2024-02-02 NOTE — Telephone Encounter (Signed)
 Dr. Morrie Sheldon please send refills on your pt. Thanks

## 2024-02-06 ENCOUNTER — Ambulatory Visit (INDEPENDENT_AMBULATORY_CARE_PROVIDER_SITE_OTHER): Admitting: Student in an Organized Health Care Education/Training Program

## 2024-02-06 VITALS — BP 175/89 | HR 75 | Wt 118.0 lb

## 2024-02-06 DIAGNOSIS — F319 Bipolar disorder, unspecified: Secondary | ICD-10-CM

## 2024-02-06 DIAGNOSIS — R4189 Other symptoms and signs involving cognitive functions and awareness: Secondary | ICD-10-CM | POA: Diagnosis not present

## 2024-02-06 DIAGNOSIS — F411 Generalized anxiety disorder: Secondary | ICD-10-CM | POA: Diagnosis not present

## 2024-02-06 MED ORDER — ARIPIPRAZOLE 15 MG PO TABS: 15.0000 mg | ORAL_TABLET | Freq: Every day | ORAL | 1 refills | Status: DC
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
## 2024-02-23 DIAGNOSIS — R269 Unspecified abnormalities of gait and mobility: Secondary | ICD-10-CM | POA: Diagnosis not present

## 2024-02-23 DIAGNOSIS — I10 Essential (primary) hypertension: Secondary | ICD-10-CM | POA: Diagnosis not present

## 2024-02-23 DIAGNOSIS — N183 Chronic kidney disease, stage 3 unspecified: Secondary | ICD-10-CM | POA: Diagnosis not present

## 2024-02-23 DIAGNOSIS — Z888 Allergy status to other drugs, medicaments and biological substances status: Secondary | ICD-10-CM | POA: Diagnosis not present

## 2024-03-02 DIAGNOSIS — R062 Wheezing: Secondary | ICD-10-CM | POA: Diagnosis not present

## 2024-03-02 DIAGNOSIS — R0989 Other specified symptoms and signs involving the circulatory and respiratory systems: Secondary | ICD-10-CM | POA: Diagnosis not present

## 2024-03-12 ENCOUNTER — Ambulatory Visit (HOSPITAL_COMMUNITY): Admitting: Student in an Organized Health Care Education/Training Program

## 2024-03-12 ENCOUNTER — Ambulatory Visit: Payer: 59 | Admitting: Physician Assistant

## 2024-03-12 VITALS — BP 163/63 | HR 78 | Wt 119.0 lb

## 2024-03-12 DIAGNOSIS — R4189 Other symptoms and signs involving cognitive functions and awareness: Secondary | ICD-10-CM

## 2024-03-12 DIAGNOSIS — F411 Generalized anxiety disorder: Secondary | ICD-10-CM

## 2024-03-12 DIAGNOSIS — F319 Bipolar disorder, unspecified: Secondary | ICD-10-CM | POA: Diagnosis not present

## 2024-03-12 MED ORDER — DIVALPROEX SODIUM ER 250 MG PO TB24: 250.0000 mg | ORAL_TABLET | Freq: Every day | ORAL | 1 refills | Status: DC

## 2024-03-12 MED ORDER — ARIPIPRAZOLE 15 MG PO TABS: 15.0000 mg | ORAL_TABLET | Freq: Every day | ORAL | 1 refills | Status: DC
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
## 2024-03-16 DIAGNOSIS — I10 Essential (primary) hypertension: Secondary | ICD-10-CM | POA: Diagnosis not present

## 2024-03-16 DIAGNOSIS — J449 Chronic obstructive pulmonary disease, unspecified: Secondary | ICD-10-CM | POA: Diagnosis not present

## 2024-03-16 DIAGNOSIS — J4521 Mild intermittent asthma with (acute) exacerbation: Secondary | ICD-10-CM | POA: Diagnosis not present

## 2024-03-22 DIAGNOSIS — M6281 Muscle weakness (generalized): Secondary | ICD-10-CM | POA: Diagnosis not present

## 2024-03-22 DIAGNOSIS — I10 Essential (primary) hypertension: Secondary | ICD-10-CM | POA: Diagnosis not present

## 2024-04-01 DIAGNOSIS — I69811 Memory deficit following other cerebrovascular disease: Secondary | ICD-10-CM | POA: Diagnosis not present

## 2024-04-09 ENCOUNTER — Ambulatory Visit (HOSPITAL_COMMUNITY): Admitting: Student in an Organized Health Care Education/Training Program

## 2024-04-09 VITALS — BP 138/56 | HR 60 | Wt 116.0 lb

## 2024-04-09 DIAGNOSIS — F411 Generalized anxiety disorder: Secondary | ICD-10-CM

## 2024-04-09 DIAGNOSIS — F319 Bipolar disorder, unspecified: Secondary | ICD-10-CM | POA: Diagnosis not present

## 2024-04-09 DIAGNOSIS — R4189 Other symptoms and signs involving cognitive functions and awareness: Secondary | ICD-10-CM | POA: Diagnosis not present

## 2024-04-09 MED ORDER — ARIPIPRAZOLE 15 MG PO TABS: 15.0000 mg | ORAL_TABLET | Freq: Every day | ORAL | 0 refills | Status: DC

## 2024-04-09 MED ORDER — HYDROXYZINE HCL 10 MG PO TABS: 10.0000 mg | ORAL_TABLET | Freq: Three times a day (TID) | ORAL | 1 refills | Status: DC | PRN

## 2024-04-09 MED ORDER — DIVALPROEX SODIUM ER 250 MG PO TB24: 250.0000 mg | ORAL_TABLET | Freq: Every day | ORAL | 0 refills | Status: DC
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
## 2024-04-26 DIAGNOSIS — I10 Essential (primary) hypertension: Secondary | ICD-10-CM | POA: Diagnosis not present

## 2024-04-26 DIAGNOSIS — J45909 Unspecified asthma, uncomplicated: Secondary | ICD-10-CM | POA: Diagnosis not present

## 2024-04-26 DIAGNOSIS — J449 Chronic obstructive pulmonary disease, unspecified: Secondary | ICD-10-CM | POA: Diagnosis not present

## 2024-05-10 DIAGNOSIS — Z0001 Encounter for general adult medical examination with abnormal findings: Secondary | ICD-10-CM | POA: Diagnosis not present

## 2024-05-10 DIAGNOSIS — I10 Essential (primary) hypertension: Secondary | ICD-10-CM | POA: Diagnosis not present

## 2024-05-10 DIAGNOSIS — M6281 Muscle weakness (generalized): Secondary | ICD-10-CM | POA: Diagnosis not present
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
## 2024-05-25 ENCOUNTER — Ambulatory Visit (INDEPENDENT_AMBULATORY_CARE_PROVIDER_SITE_OTHER): Admitting: Psychiatry

## 2024-05-25 DIAGNOSIS — F319 Bipolar disorder, unspecified: Secondary | ICD-10-CM | POA: Diagnosis not present

## 2024-05-25 DIAGNOSIS — R4189 Other symptoms and signs involving cognitive functions and awareness: Secondary | ICD-10-CM | POA: Diagnosis not present

## 2024-05-25 MED ORDER — ARIPIPRAZOLE 15 MG PO TABS: 15.0000 mg | ORAL_TABLET | Freq: Every day | ORAL | 2 refills | Status: DC

## 2024-05-25 MED ORDER — DIVALPROEX SODIUM ER 250 MG PO TB24: 250.0000 mg | ORAL_TABLET | Freq: Every day | ORAL | 2 refills | Status: DC

## 2024-05-28 ENCOUNTER — Other Ambulatory Visit: Payer: Self-pay | Admitting: Internal Medicine

## 2024-06-07 DIAGNOSIS — I10 Essential (primary) hypertension: Secondary | ICD-10-CM | POA: Diagnosis not present

## 2024-06-07 DIAGNOSIS — J45909 Unspecified asthma, uncomplicated: Secondary | ICD-10-CM | POA: Diagnosis not present

## 2024-06-07 DIAGNOSIS — K219 Gastro-esophageal reflux disease without esophagitis: Secondary | ICD-10-CM | POA: Diagnosis not present

## 2024-06-17 NOTE — Telephone Encounter (Signed)
 SABRA

## 2024-07-05 DIAGNOSIS — J069 Acute upper respiratory infection, unspecified: Secondary | ICD-10-CM | POA: Diagnosis not present

## 2024-07-05 DIAGNOSIS — I10 Essential (primary) hypertension: Secondary | ICD-10-CM | POA: Diagnosis not present

## 2024-07-05 DIAGNOSIS — J449 Chronic obstructive pulmonary disease, unspecified: Secondary | ICD-10-CM | POA: Diagnosis not present
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
## 2024-07-13 ENCOUNTER — Encounter (HOSPITAL_COMMUNITY): Admitting: Psychiatry
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
## 2024-08-02 DIAGNOSIS — I10 Essential (primary) hypertension: Secondary | ICD-10-CM | POA: Diagnosis not present

## 2024-08-02 DIAGNOSIS — J449 Chronic obstructive pulmonary disease, unspecified: Secondary | ICD-10-CM | POA: Diagnosis not present

## 2024-08-06 ENCOUNTER — Encounter (HOSPITAL_COMMUNITY): Admitting: Psychiatry

## 2024-08-25 DIAGNOSIS — J4540 Moderate persistent asthma, uncomplicated: Secondary | ICD-10-CM | POA: Diagnosis not present

## 2024-08-25 DIAGNOSIS — J449 Chronic obstructive pulmonary disease, unspecified: Secondary | ICD-10-CM | POA: Diagnosis not present
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
## 2024-08-29 ENCOUNTER — Other Ambulatory Visit: Payer: Self-pay | Admitting: Internal Medicine

## 2024-08-31 ENCOUNTER — Encounter (HOSPITAL_COMMUNITY): Payer: Self-pay | Admitting: Psychiatry

## 2024-08-31 ENCOUNTER — Ambulatory Visit (INDEPENDENT_AMBULATORY_CARE_PROVIDER_SITE_OTHER): Admitting: Psychiatry

## 2024-08-31 DIAGNOSIS — R4189 Other symptoms and signs involving cognitive functions and awareness: Secondary | ICD-10-CM | POA: Diagnosis not present

## 2024-08-31 DIAGNOSIS — F411 Generalized anxiety disorder: Secondary | ICD-10-CM

## 2024-08-31 DIAGNOSIS — F3130 Bipolar disorder, current episode depressed, mild or moderate severity, unspecified: Secondary | ICD-10-CM

## 2024-08-31 MED ORDER — ATROPINE SULFATE 1 % OP SOLN: 1.0000 [drp] | Freq: Every day | OPHTHALMIC | 2 refills | Status: AC | PRN

## 2024-08-31 MED ORDER — HYDROXYZINE HCL 10 MG PO TABS: 10.0000 mg | ORAL_TABLET | Freq: Three times a day (TID) | ORAL | 2 refills | Status: AC | PRN

## 2024-08-31 MED ORDER — ARIPIPRAZOLE 15 MG PO TABS
15.0000 mg | ORAL_TABLET | Freq: Every day | ORAL | 2 refills | Status: AC
Start: 2024-08-31 — End: 2024-11-29

## 2024-08-31 MED ORDER — ATROPINE SULFATE 1 % OP SOLN: 1.0000 [drp] | Freq: Every day | OPHTHALMIC | 2 refills | Status: DC

## 2024-08-31 MED ORDER — DIVALPROEX SODIUM ER 250 MG PO TB24: 250.0000 mg | ORAL_TABLET | Freq: Every day | ORAL | 2 refills | Status: AC

## 2024-08-31 NOTE — Addendum Note (Signed)
 Addended by: CARVIN CROCK on: 08/31/2024 01:54 PM   Modules accepted: Level of Service

## 2024-09-01 ENCOUNTER — Telehealth (HOSPITAL_COMMUNITY): Payer: Self-pay

## 2024-09-01 NOTE — Telephone Encounter (Signed)
 Per patient pharmacy ,two prescriptions were sent for the Atropine 1 %. One stating take daily and one is as needed. Requesting clarification on instructions.

## 2024-10-04 ENCOUNTER — Encounter: Payer: Self-pay | Admitting: Psychology

## 2024-10-04 DIAGNOSIS — M8580 Other specified disorders of bone density and structure, unspecified site: Secondary | ICD-10-CM | POA: Insufficient documentation

## 2024-10-04 DIAGNOSIS — M545 Low back pain, unspecified: Secondary | ICD-10-CM | POA: Insufficient documentation

## 2024-10-04 DIAGNOSIS — J4530 Mild persistent asthma, uncomplicated: Secondary | ICD-10-CM | POA: Insufficient documentation

## 2024-10-04 DIAGNOSIS — Z91018 Allergy to other foods: Secondary | ICD-10-CM | POA: Insufficient documentation

## 2024-10-04 DIAGNOSIS — I2510 Atherosclerotic heart disease of native coronary artery without angina pectoris: Secondary | ICD-10-CM | POA: Insufficient documentation

## 2024-10-05 ENCOUNTER — Institutional Professional Consult (permissible substitution): Payer: 59 | Admitting: Psychology

## 2024-10-05 ENCOUNTER — Encounter: Payer: Self-pay | Admitting: Psychology

## 2024-10-05 ENCOUNTER — Ambulatory Visit: Payer: Self-pay

## 2024-10-14 ENCOUNTER — Encounter: Admitting: Psychology
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
## 2024-10-26 ENCOUNTER — Encounter (HOSPITAL_COMMUNITY): Admitting: Psychiatry

## 2024-11-15 ENCOUNTER — Telehealth: Payer: Self-pay

## 2024-11-15 NOTE — Telephone Encounter (Signed)
 Copied from CRM [id number]. Topic: General - Other >> Nov 15, 2024 12:30 PM Nessti S wrote: Reason for CRM: wanted to make sure that incontinence renewal form was received 12/22 or 12/29. She would like a call back soon as possible

## 2024-11-16 NOTE — Telephone Encounter (Signed)
 Paperwork received. Spoke with daughter, pt is currently in a facility. Per Dr. Rollene the would have to be the ones to refill request.

## 2024-11-16 NOTE — Telephone Encounter (Unsigned)
 Copied from CRM [id number]. Topic: General - Other >> Nov 16, 2024 10:44 AM Alfonso HERO wrote: Reason for CRM: [facility] calling for status of incontinence supply form being faxed over. I see them scanned in the chart. Can thy please be refaxed to the regular fax and the ALT fax# as well to make sure they are rcvd?

## 2024-11-19 NOTE — Telephone Encounter (Unsigned)
 Copied from CRM #[id number]. Topic: General - Other >> Nov 19, 2024 12:52 PM Taleah C wrote: Reason for CRM: [facility] back and stated that they still have not received the forms for renewal of incontinence supplies. They called earlier this week and was told it was sent to them. Please call and advise.

## 2024-11-19 NOTE — Telephone Encounter (Signed)
 Paperwork was received however, pt is in a facility. Per Dr. Rollene they (the facility) needed to be the ones to order.

## 2024-11-24 NOTE — Telephone Encounter (Signed)
 Called [facility] and spoke with Barnie in medical records. Stated that they were unaware that the pt was in a facility. Advised that Dr. Rollene is unable to fill this request and the facility would be the ones to request supplies. Barnie stated that they do not provide supplies to a facility. Asked if facility was providing supplies and what facility for pt however, I was unable to answer those question. No additional information was requested.

## 2024-11-24 NOTE — Telephone Encounter (Signed)
 Per medicaid requirements we need visit within 6 months of paperwork discussing the condition. We do not have recent visit. If facility unable to order needs visit with us  to discuss.

## 2024-11-25 ENCOUNTER — Encounter: Payer: 59 | Admitting: Psychology

## 2025-01-10 ENCOUNTER — Ambulatory Visit: Payer: 59

## 2025-01-11 ENCOUNTER — Ambulatory Visit
